# Patient Record
Sex: Female | Born: 1945
Health system: Southern US, Community
[De-identification: ages and names within clinical notes are randomized; demographics above are authoritative.]

## PROBLEM LIST (undated history)

## (undated) DIAGNOSIS — F329 Major depressive disorder, single episode, unspecified: Secondary | ICD-10-CM

## (undated) DIAGNOSIS — F32A Depression, unspecified: Secondary | ICD-10-CM

## (undated) DIAGNOSIS — R519 Headache, unspecified: Secondary | ICD-10-CM

## (undated) DIAGNOSIS — E119 Type 2 diabetes mellitus without complications: Secondary | ICD-10-CM

## (undated) DIAGNOSIS — J302 Other seasonal allergic rhinitis: Secondary | ICD-10-CM

## (undated) DIAGNOSIS — S060X9A Concussion with loss of consciousness of unspecified duration, initial encounter: Secondary | ICD-10-CM

## (undated) DIAGNOSIS — S060XAA Concussion with loss of consciousness status unknown, initial encounter: Secondary | ICD-10-CM

## (undated) DIAGNOSIS — I2510 Atherosclerotic heart disease of native coronary artery without angina pectoris: Secondary | ICD-10-CM

## (undated) DIAGNOSIS — M1990 Unspecified osteoarthritis, unspecified site: Secondary | ICD-10-CM

## (undated) DIAGNOSIS — R1310 Dysphagia, unspecified: Secondary | ICD-10-CM

## (undated) DIAGNOSIS — R51 Headache: Secondary | ICD-10-CM

## (undated) DIAGNOSIS — K219 Gastro-esophageal reflux disease without esophagitis: Secondary | ICD-10-CM

## (undated) DIAGNOSIS — Z8709 Personal history of other diseases of the respiratory system: Secondary | ICD-10-CM

## (undated) DIAGNOSIS — B351 Tinea unguium: Secondary | ICD-10-CM

## (undated) DIAGNOSIS — I10 Essential (primary) hypertension: Secondary | ICD-10-CM

## (undated) DIAGNOSIS — I499 Cardiac arrhythmia, unspecified: Secondary | ICD-10-CM

## (undated) HISTORY — DX: Atherosclerotic heart disease of native coronary artery without angina pectoris: I25.10

## (undated) HISTORY — PX: TOTAL ABDOMINAL HYSTERECTOMY: SHX209

## (undated) HISTORY — DX: Headache: R51

## (undated) HISTORY — DX: Dysphagia, unspecified: R13.10

## (undated) HISTORY — PX: APPENDECTOMY: SHX54

## (undated) HISTORY — DX: Gastro-esophageal reflux disease without esophagitis: K21.9

## (undated) HISTORY — PX: OTHER SURGICAL HISTORY: SHX169

## (undated) HISTORY — DX: Type 2 diabetes mellitus without complications: E11.9

## (undated) HISTORY — PX: EYE SURGERY: SHX253

## (undated) HISTORY — DX: Headache, unspecified: R51.9

## (undated) HISTORY — PX: BACK SURGERY: SHX140

## (undated) HISTORY — PX: CHOLECYSTECTOMY: SHX55

## (undated) HISTORY — PX: TONSILLECTOMY: SUR1361

---

## 1998-01-18 ENCOUNTER — Inpatient Hospital Stay (HOSPITAL_COMMUNITY): Admission: EM | Admit: 1998-01-18 | Discharge: 1998-01-19 | Payer: Self-pay | Admitting: Orthopedic Surgery

## 1998-07-27 ENCOUNTER — Ambulatory Visit (HOSPITAL_COMMUNITY): Admission: RE | Admit: 1998-07-27 | Discharge: 1998-07-27 | Payer: Self-pay | Admitting: Neurological Surgery

## 1998-07-27 ENCOUNTER — Encounter: Payer: Self-pay | Admitting: Neurological Surgery

## 1998-11-09 ENCOUNTER — Inpatient Hospital Stay (HOSPITAL_COMMUNITY): Admission: EM | Admit: 1998-11-09 | Discharge: 1998-11-12 | Payer: Self-pay | Admitting: Orthopedic Surgery

## 1998-11-09 ENCOUNTER — Encounter: Payer: Self-pay | Admitting: Orthopedic Surgery

## 1999-08-02 ENCOUNTER — Encounter: Payer: Self-pay | Admitting: Orthopedic Surgery

## 1999-08-02 ENCOUNTER — Ambulatory Visit (HOSPITAL_COMMUNITY): Admission: RE | Admit: 1999-08-02 | Discharge: 1999-08-02 | Payer: Self-pay | Admitting: Orthopedic Surgery

## 1999-09-12 ENCOUNTER — Encounter: Payer: Self-pay | Admitting: Orthopedic Surgery

## 1999-09-13 ENCOUNTER — Inpatient Hospital Stay (HOSPITAL_COMMUNITY): Admission: EM | Admit: 1999-09-13 | Discharge: 1999-09-14 | Payer: Self-pay | Admitting: Orthopedic Surgery

## 2001-05-20 ENCOUNTER — Encounter: Payer: Self-pay | Admitting: Physical Medicine and Rehabilitation

## 2001-05-20 ENCOUNTER — Encounter
Admission: RE | Admit: 2001-05-20 | Discharge: 2001-05-20 | Payer: Self-pay | Admitting: Physical Medicine and Rehabilitation

## 2002-10-13 ENCOUNTER — Ambulatory Visit (HOSPITAL_BASED_OUTPATIENT_CLINIC_OR_DEPARTMENT_OTHER): Admission: RE | Admit: 2002-10-13 | Discharge: 2002-10-14 | Payer: Self-pay | Admitting: Orthopedic Surgery

## 2003-04-20 ENCOUNTER — Ambulatory Visit (HOSPITAL_BASED_OUTPATIENT_CLINIC_OR_DEPARTMENT_OTHER): Admission: RE | Admit: 2003-04-20 | Discharge: 2003-04-20 | Payer: Self-pay | Admitting: Orthopedic Surgery

## 2005-07-23 ENCOUNTER — Ambulatory Visit: Payer: Self-pay | Admitting: Internal Medicine

## 2006-07-24 ENCOUNTER — Ambulatory Visit: Payer: Self-pay | Admitting: Internal Medicine

## 2006-07-30 ENCOUNTER — Ambulatory Visit (HOSPITAL_COMMUNITY): Admission: RE | Admit: 2006-07-30 | Discharge: 2006-07-30 | Payer: Self-pay | Admitting: Internal Medicine

## 2006-07-30 ENCOUNTER — Ambulatory Visit: Payer: Self-pay | Admitting: Internal Medicine

## 2006-07-31 ENCOUNTER — Ambulatory Visit (HOSPITAL_COMMUNITY): Admission: RE | Admit: 2006-07-31 | Discharge: 2006-07-31 | Payer: Self-pay | Admitting: Internal Medicine

## 2006-09-11 ENCOUNTER — Ambulatory Visit: Payer: Self-pay | Admitting: Internal Medicine

## 2006-09-20 ENCOUNTER — Ambulatory Visit: Payer: Self-pay | Admitting: Internal Medicine

## 2006-09-20 ENCOUNTER — Ambulatory Visit (HOSPITAL_COMMUNITY): Admission: RE | Admit: 2006-09-20 | Discharge: 2006-09-20 | Payer: Self-pay | Admitting: Internal Medicine

## 2007-02-19 ENCOUNTER — Ambulatory Visit: Payer: Self-pay | Admitting: Cardiology

## 2007-07-29 ENCOUNTER — Ambulatory Visit (HOSPITAL_BASED_OUTPATIENT_CLINIC_OR_DEPARTMENT_OTHER): Admission: RE | Admit: 2007-07-29 | Discharge: 2007-07-29 | Payer: Self-pay | Admitting: Orthopedic Surgery

## 2008-02-25 ENCOUNTER — Ambulatory Visit (HOSPITAL_BASED_OUTPATIENT_CLINIC_OR_DEPARTMENT_OTHER): Admission: RE | Admit: 2008-02-25 | Discharge: 2008-02-25 | Payer: Self-pay | Admitting: Orthopedic Surgery

## 2010-10-31 NOTE — Op Note (Signed)
NAMEHELENA, Katrina Castro              ACCOUNT NO.:  [id number]   MEDICAL RECORD NO.:  [id number]          PATIENT TYPE:  AMB   LOCATION:  DSC                          FACILITY:  [facility]   PHYSICIAN:  Leonides Grills, M.D.     DATE OF BIRTH:  September 18, 1945   DATE OF PROCEDURE:  02/25/2008  DATE OF DISCHARGE:                               OPERATIVE REPORT   PREOPERATIVE DIAGNOSIS:  Right bunion on to his right fifth metatarsal  spur.   POSTOPERATIVE DIAGNOSIS:  Right bunion on to his right fifth metatarsal  spur.   OPERATION:  1. Right varus fifth metatarsal osteotomy.  2. Excision dorsal fifth metatarsal head spur.  3. Stress x-rays of foot.   ANESTHESIA:  General.   SURGEON:  Leonides Grills, MD   ASSISTANT:  Richardean Canal, PA-C   ESTIMATED BLOOD LOSS:  Minimal.   TOURNIQUET TIME:  Approximately 40 minutes.   COMPLICATIONS:  None.   DISPOSITION:  Stable to PR.   INDICATION:  This is a 65 year old female, who has had persistent pain  over the lateral aspect fifth metatarsal head.  She was found to have an  increased 4, 5 inner metatarsal angle.  She was sent for the above  procedure.  All risks of infection or vessel injury, nonunion, malunion,  hardware irritation, hardware failure, persistent pain, worsening pain,  prolonged recovery, stiffness, arthritis, painful scar tissue formation  were all explained.  Questions were encouraged and answered.   OPERATION:  The patient was brought to the operating room, placed in  supine position after adequate general anesthesia administered as well  as Ancef 1 g IV piggyback.  Bump was placed on the right ipsilateral  hip, internally rotating right lower extremity.  Right lower extremity  was prepped and draped in sterile manner over proximally placed thigh  tourniquet.  The limb was gravity exsanguinated and the tourniquet was  inflated to 90 mmHg.  A longitudinal incision on dorsolateral aspect to  the right fifth metatarsal shaft was then  made.  Dissection was carried  down through skin.  Hemostasis was obtained.  Soft tissue was elevated  superiorly and inferiorly.  We then extended the incision distally over  the dorsolateral aspect of fifth metatarsal head and removed the spur  with a rongeur.  This palpated through skin and adequately decompressed.  We then performed an oblique osteotomy through the right fifth  metatarsal.  We then corrected the deformity and then placed 2.0 mm  fully threaded cortical set screws using 1.5-mm drill hole respectively.  This had excellent purchase and maintenance of the correction.  The  redundant bone ledge was trimmed off with a sagittal saw.  Stress x-rays  were obtained in the AP and lateral planes, showed no  gross motion fixation, proprioception, and excellent alignment as well.  Area was copiously irrigated with normal saline.  Tourniquet deflated.  Hemostasis was obtained.  Subcu was closed with 3-0 Vicryl.  Skin was  closed with 4-0 nylon.  Sterile dressing was applied.  A hard-soled shoe  was applied.  The patient was stable to PR.  Leonides Grills, M.D.  Electronically Signed     PB/MEDQ  D:  02/25/2008  T:  02/26/2008  Job:  [id number]

## 2010-10-31 NOTE — Op Note (Signed)
[name], Katrina Castro              ACCOUNT NO.:  [id number]   MEDICAL RECORD NO.:  [id number]          PATIENT TYPE:  AMB   LOCATION:  DSC                          FACILITY:  [facility]   PHYSICIAN:  Leonides Grills, M.D.     DATE OF BIRTH:  01-31-1946   DATE OF PROCEDURE:  07/29/2007  DATE OF DISCHARGE:                               OPERATIVE REPORT   PREOPERATIVE DIAGNOSIS:  Complication of hardware, left second toe K-  wire.   POSTOPERATIVE DIAGNOSIS:  Complication of hardware, left second toe K-  wire, plus complication of hardware, left third toe K-wire.   PROCEDURE:  1. K-wire removal left second and third toes.  2. Fixation left second and third toes with K-wires under C-arm      guidance.   ANESTHESIA:  General.   SURGEON:  Leonides Grills, M.D.   ASSISTANT:  None.   COMPLICATIONS:  None.   DISPOSITION:  Stable to the PR.   INDICATIONS:  65 year old female who, approximately three weeks ago,  underwent a left second and third [employer] hammertoe reconstruction  and bent her K-wires with heavy covers on her wires without them being  protected.  She was consented for the above procedure.  All risks  including infection, neurovascular injury, deformity of the toes,  persistent pain, again, were all explained, questions were encouraged  and answered.   OPERATION:  The patient was brought to the operating room and placed in  a supine position. After adequate general anesthesia was administered as  well as Ancef 1 gram IV piggyback, the left lower extremity was prepped  and draped in a sterile manner.  No tourniquet was used.  The second K-  wire was removed with needle nose pliers and this was intact and was  slightly bent. This was then replaced with a 0.062 K-wire double ended  trocar which was placed under C-arm guidance and was placed in a  position that would seem more symmetric across her forefoot. Her great  toe has hallux varus and her fourth toe is in valgus, which  leaves a lot  of room between the great toe and fourth toe. We also checked the third  toe wire and this was grossly loose and appeared to be slightly bent. We  decided to remove this wire in an effort to create symmetry of her toes.  This wire was removed and replaced with a 0.062 K-wire under C-arm  guidance, as well.  Once this wire was in place and had excellent  purchase and clinically,  there was no gross motion, we obtained an x-ray to verify its excellent  alignment.  K-wires were bent, cut and capped. Xeroform was placed  around the wires.  A soft dressing was applied.  A hard sole shoe was  applied.  The patient was stable to the PR.      Leonides Grills, M.D.  Electronically Signed     PB/MEDQ  D:  07/29/2007  T:  07/30/2007  Job:  [id number]

## 2010-11-03 NOTE — H&P (Signed)
Katrina Castro, Katrina Castro              ACCOUNT NO.:  [id number]   MEDICAL RECORD NO.:  [id number]           PATIENT TYPE:  AMB   LOCATION:                                FACILITY:  [facility]   PHYSICIAN:  Lionel December, M.D.    DATE OF BIRTH:  11/27/1945   DATE OF ADMISSION:  DATE OF DISCHARGE:  LH                              HISTORY & PHYSICAL   PRESENTING COMPLAINT:  Solid food dysphagia.   Abnormal abdominopelvic CT revealing abnormality to the mesentery and  large lymph nodes.  Study done May 01, 2006 at [facility].   HISTORY OF PRESENT ILLNESS:  Katrina Castro is a 65 year old Caucasian female  who is here for a scheduled visit.  She is well-known to me; however,  she has not been seen in this office since February of 2007.   She has been experiencing intermittent abdominal pain.  She had an  abdominopelvic CT by Dr. Sherril Croon on May 01, 2006 which revealed  segmental misty mesentery with prominent lymph nodes.  Dr. Ilsa Iha, who  read the films, felt that these findings were nonspecific, but can be  the results of previous inflammatory or infectious process, and he felt  malignancy is within the differential diagnosis but less likely, given  the appearance.  He recommended a follow up exam.  Please note that this  study was done looking for stones and was an unenhanced study.  She is  still having intermittent pain in her mid abdomen.  She also complains  of irregular bowel movements.  She recently became constipated and had  difficulty getting relief.  She recently had a GI virus, from which she  is recovered.  She has lost 24 pounds since her last visit.  She states  all of this weight loss has occurred in the last 3 months.  She has  nausea.  She denies melena, rectal bleeding or vomiting.  She states  Nexium was not covered on her plan and she could not afford it.  She is  on omeprazole which is not controlling her heartburn.  She also  complains of chronic back pain.  She has had multiple  surgeries.   MEDICATIONS:  1. Omeprazole 20 mg q.a.m.  2. Colace 1-2 tablets daily p.r.n.  3. MOM p.r.n.  4. Lunesta, question dose, q.h.s. p.r.n.   PAST MEDICAL HISTORY:  1. She has chronic GERD.  She has undergone multiple EGDs.  She had      her esophagus dilated in January of 2001.  She has history of      erosive reflux esophagitis ring and a sliding hiatal hernia.  She      also has history of abdominal pain and irregular bowel movements.      About 6-7 years ago, she was having diarrhea, and she had multiple      studies including small-bowel follow-through.  Her last colonoscopy      was in July of 2003 by Dr. Linna Darner, which was a normal exam.  2. She has fibromyalgia.  3. She has chronic low back pain.  She has had multiple back  surgeries; the last one was in 2000 or 2001.  4. She has had bilateral cataract extraction which was in 2006.  5. Bilateral bunionectomy.  6. Hysterectomy with oophorectomy.  7. At age 49, she had laparoscopic cholecystectomy.  8. In October of 1996, she had right knee arthroscopy.  9. She also had surgery on her right foot in September of 2004.  10.She was admitted to [facility] in June of 2006 with what was felt to be      acute gastroenteritis.  She was noted to have elevated      transaminases, and she believes they have subsequently been normal.      She also had a celiac antibody panel in February of 2007, which was      negative.  (This was may be incomplete.)   ALLERGIES:  1. SULFA causing mouth ulcers.  2. She developed nausea, vomiting of OXYCONTIN.   FAMILY HISTORY:  Positive for diabetes mellitus in mother and a sister,  both of whom are deceased.  Mother lived to be 18 and sister died in her  74s.  Father died of CAD at 74.   SOCIAL HISTORY:  She is disabled.  She is divorced.  She has 2 children.  She does not smoke cigarettes but drinks alcohol occasionally.   OBJECTIVE:  VITAL SIGNS:  Weight 154 pounds.  She is 5 feet 2 inches   tall.  Pulse 64 per minute, blood pressure 112/68, temperature is 98.5.  HEENT:  Conjunctivae pink.  Sclerae nonicteric.  Oropharyngeal mucosa is  normal.  NECK:  No neck masses or thyromegaly noted.  LUNGS:  Clear to auscultation.  ABDOMEN:  Symmetrical.  Bowel sounds are normal.  On palpation, soft  abdomen with mild tenderness in the midepigastrium, periumbilical area  in left lower quadrant.  No organomegaly or masses noted.  RECTAL:  Deferred.  EXTREMITIES:  No clubbing or edema noted.   ASSESSMENT:  1. Dysphagia.  She possibly has recurrent ring.  Her gastroesophageal      reflux disease symptoms are not well controlled with a single dose      of Omeprazole.  Her upper gastrointestinal tract will be      reevaluated and her esophagus dilated.  2. Recurrent abdominal pain with abnormality on abdominal CT done in      November of 2007 showing a hazy mesentery and enlarged lymph nodes.      Over the years, she had multiple CTs, but these findings have not      been appreciated on prior studies.  It is almost 3 months;      therefore, it is time for [place] to repeat the study with oral and IV      contrast.  3. Weight loss.  This may be related to her solid food dysphagia.  I      hope we are not dealing with a malignant process.   RECOMMENDATIONS:  1. The patient advised to take 2 tablets of Colace every night.  2. Esophagogastroduodenoscopy and esophageal dilation to be performed      at [facility] in the near      future.  3. Increase Omeprazole to 40 mg q.a.m.; new prescription given.  4. Abdominopelvic CT with oral and IV contrast to be performed at [facility]      in the near future.      Lionel December, M.D.  Electronically Signed     NR/MEDQ  D:  07/24/2006  T:  07/25/2006  Job:  [id number]   cc:   Donzetta Sprung  Fax: [phone]

## 2010-11-03 NOTE — Op Note (Signed)
NAME:  Katrina Castro, Katrina Castro                        ACCOUNT NO.:  [id number]   MEDICAL RECORD NO.:  [id number]                   PATIENT TYPE:  AMB   LOCATION:  DSC                                  FACILITY:  [facility]   PHYSICIAN:  Leonides Grills, M.D.                  DATE OF BIRTH:  14-Nov-1945   DATE OF PROCEDURE:  10/13/2002  DATE OF DISCHARGE:                                 OPERATIVE REPORT   PREOPERATIVE DIAGNOSIS:  1. Right second, third and fourth hammer toes.  2. Right second metatarsalgia.  3. Right tight gastroc.   POSTOPERATIVE DIAGNOSIS:  1. Right second, third and fourth hammer toes.  2. Right second metatarsalgia.  3. Right tight gastroc.   OPERATION PERFORMED:  1. Right second Weil metatarsal shortening osteotomy.  2. Right gastroc slide.  3. Right second through fourth toes metatarsophalangeal joint dorsal     capsulotomy with collateral release.  4. Right second through fourth toes proximal phalanx head resections.  5. Right second through fourth toes flexor digitorum longus to proximal     phalanx transfers.  6. Right second through fourth toes extensor digitorum brevis to extensor     digitorum longus transfers.   SURGEON:  Leonides Grills, M.D.   ASSISTANT:  Lianne Cure, P.A.   ANESTHESIA:  General endotracheal tube.   ESTIMATED BLOOD LOSS:  Minimal.   TOURNIQUET TIME:  Approximately an hour and a half.   COMPLICATIONS:  None.   DISPOSITION:  Stable to the PR.   INDICATIONS FOR PROCEDURE:  This is a 65 year old female who has had a  previous lesser toe reconstructive procedures in the past.  However, she has  developed a recurrence of the deformity to a point where it is interfering  with her life to the point that she cannot do what she wants to do.  She was  consented for the above procedure.  All risks which include infection,  neurovascular injury, persistent pain, worsening of pain, recurrence of the  deformity, ischemia of the toe with possible  amputation.  All were  explained, questions were encouraged and answered.   DESCRIPTION OF PROCEDURE:  The patient was brought to the operating room and  placed in supine position.  After adequate general endotracheal tube  anesthesia was administered as well as Ancef 1 gm IV piggyback the right  lower extremity was then prepped and draped in sterile manner over a  proximally placed thigh tourniquet.  The procedure commenced with a  longitudinal incision over the medial gastroc musculotendinous junction.  Dissection was carried down through skin.  Hemostasis was obtained.  Fascia  was opened in line with the incision.  The conjoined region between gastroc  soleus was then developed.  Soft tissues were then elevated off the  posterior aspect of the gastrocnemius tendon.  The sural nerve was  identified posteriorly and protected as well.  Then with the Mayo scissors,  the gastrocnemius tendon was released.  The wound was copiously irrigated  with normal saline.  The subcu was closed with 3-0 Vicryl, skin was closed  with 4-0 Monocryl subcuticular stitch.  Steri-Strips were applied.  The limb  was gravity exsanguinated and the tourniquet was elevated to 290 mmHg.  Longitudinal incision over the second toe was then made.  Dissection was  carried down to the extensor tendons.  The extensor digitorum brevis was  tenotomized distally at the PIP joint and the FDL tendon proximally at the  MTP joint.  The MPT joint dorsal capsulotomy was then performed with  collateral release with a 15 blade scalpel.  PIP joint was then entered and  was solidly fused in a 45 degree flexed position.  With the rongeur, this  was then opened.  After fastening this in the proper position and resecting  the head of the proximal phalanx, a __________ was then done of the mallet  toe distally and this straightened out nicely.  The FPL tendon was then  identified and through the plantar plate base and was tenotomized as  distal  as possible and also helped with the mallet toe deformity as well.  We then  back into the MTP joint with the toe flexed.  Weil osteotomy was then done  with a sagittal saw with the saw blade parallel to the plantar aspect of the  foot.  Once this was done, the metatarsal head was shortened about 3 to 4  mm.  This was then fixed with a new deal 12 mm partially threaded screw.  The remaining dorsal portion of the osteotomy was trimmed with a rongeur.  A  3.5 mm drill hole was then placed in the base of the proximal phalanx.  With  3-0 PDS, the FDL tendon was then transferred through the base of the  proximal phalanx from plantar to dorsal.  A 0.045 double ended trocar K-wire  was then placed in antegrade manner through the middle and distal phalanx  and with the PIP held reduced, the K-wire was then advanced through the  proximal phalanx.  Then with the MTP joint held reduced, the ankle in  neutral dorsiflexion and tension on the FDL tendon through the drill hole, K-  wire was then passed across the MTP joint.  The EDB to EDL was then  transferred dorsally with 3-0 PDS.  The same exact procedure was done  through a separate incision for the third and fourth toes respectively;  however, we did not do the Weil osteotomy for the third and fourth toes.  The tourniquet was deflated.  At the end of the procedure, all toes pinked  up nicely.  Skin relieving incisions with an 11 blade scalpel were then made  over each K-wire individually.  K-wires were bent, cut and capped.  Wounds  were closed with 4-0 nylon suture.  Sterile dressing was applied, Cam walker  boot was applied.  Patient was stable to the PAR.                                                Leonides Grills, M.D.    PB/MEDQ  D:  10/13/2002  T:  10/13/2002  Job:  [id number]

## 2010-11-03 NOTE — Op Note (Signed)
Katrina Castro              ACCOUNT NO.:  [id number]   MEDICAL RECORD NO.:  [id number]          PATIENT TYPE:  AMB   LOCATION:  DAY                           FACILITY:  [facility]   PHYSICIAN:  Lionel December, M.D.    DATE OF BIRTH:  1946-06-18   DATE OF PROCEDURE:  07/30/2006  DATE OF DISCHARGE:                               OPERATIVE REPORT   PROCEDURE:  Esophagogastroduodenoscopy with esophageal dilation.   INDICATIONS:  Katrina Castro is a 65 year old Caucasian female with symptoms of  chronic GERD poorly controlled with therapy, who also has solid food  dysphagia.  She has been having intermittent hoarseness.  She was  recently seen in the office and her omeprazole dose was doubled to 40 mg  q.a.m. and she has noted some improvement in her heartburn.  Procedure  risks were reviewed with the patient and informed consent was obtained.   MEDICATIONS FOR CONSCIOUS SEDATION:  Benzocaine spray for pharyngeal  topical anesthesia, Demerol 50 mg and IV Versed 10 mg IV in divided  dose.   FINDINGS:  Procedure performed in endoscopy suite.  The patient's vital  signs and O2 sats were monitored during the procedure and remained  stable.  The patient was placed in the left lateral recumbent position  and Pentax videoscope was passed via oropharynx without any difficulty  into esophagus.   Esophagus:  Mucosa of the esophagus was normal.  Noncritical ring was  noted at GE junction which was 34 cm and hiatus was at 36 cm.  She had  small sliding hiatal hernia.   Stomach:  It was empty and distended very well with insufflation.  Folds  of the proximal stomach were normal.  Examination of mucosa at body,  antrum, pyloric channel as well as angularis, fundus and cardia was  normal.   Duodenum:  Bulbar mucosa was normal.  Scope was passed to the second  part of the duodenum where mucosa and folds were normal.  Endoscope was  withdrawn.   Esophagus was dilated by passing 56-French [facility]oney dilator  to full  insertion.  Esophageal mucosa was reexamined post dilation and the ring  was noted to have been disrupted.  Pictures taken for the record.  Endoscope was withdrawn.  The patient tolerated the procedure well.   FINAL DIAGNOSES:  1. Distal esophageal ring and a small sliding hiatal hernia.  Ring      disrupted by passing 56-French [facility] dilator.  2. Normal examination of stomach, first and second part of the      duodenum.   RECOMMENDATIONS:  1. Antireflux measures reinforced.  2. She will continue omeprazole at 40 mg p.o. q.a.m.Marland Kitchen  3. Abdominopelvic CT as planned to follow up abnormality seen on her      last study about 12 weeks ago.      Lionel December, M.D.  Electronically Signed     NR/MEDQ  D:  07/30/2006  T:  07/30/2006  Job:  [id number]

## 2010-11-03 NOTE — Op Note (Signed)
Katrina Castro, Katrina Castro              ACCOUNT NO.:  [id number]   MEDICAL RECORD NO.:  [id number]          PATIENT TYPE:  AMB   LOCATION:  DAY                           FACILITY:  [facility]   PHYSICIAN:  Lionel December, M.D.    DATE OF BIRTH:  [birth year]   DATE OF PROCEDURE:  09/20/2006  DATE OF DISCHARGE:  09/20/2006                               OPERATIVE REPORT   INDICATIONS:  Katrina Castro is 65 year old Caucasian female who has history of  esophageal ring. She underwent EGD with ED on July 30, 2006.  The  ring was noncritical and was disrupted by passing 56-French [facility]  dilator.  However, she is not any better.  She also had barium pill  study and [facility] which was unremarkable.  She is undergoing esophageal  manometry to rule out motility disorder.  The procedure risks were  reviewed with the patient and informed consent was obtained.   PROCEDURE:  Standard solid-state [employer] system was used.   FINDINGS:  The esophageal body, 10 out of 10 swallows were peristaltic.   Mean amplitude at 13 cm proximal. LES was 32.3 mmHg and duration was 1.4  seconds.  Mean amplitude at 8 cm was 112.2 mmHg and duration was 4.1  seconds. Mean amplitude at 3 cm was 163 mmHg and duration was 5 seconds.  Mean amplitude in distal two ports 137.6 mmHg and duration was 4.55  seconds.   Lower esophageal sphincter located between 36 and 39 cm from the  incisors.  Resting LES pressure 28.5 mmHg, relaxation 100%.   IMPRESSION:  Normal LES and esophageal body study.   RECOMMENDATIONS:  Empiric trial with calcium channel blocker, start on  Cardizem 120 mg daily.  If she does not respond to this therapy would  consider referral to tertiary center for impedance study.      Lionel December, M.D.  Electronically Signed     NR/MEDQ  D:  10/02/2006  T:  10/02/2006  Job:  [id number]   cc:   Donzetta Sprung  Fax: [phone]

## 2010-11-03 NOTE — Op Note (Signed)
NAME:  Katrina Castro, Katrina Castro                        ACCOUNT NO.:  [id number]   MEDICAL RECORD NO.:  [id number]                   PATIENT TYPE:  AMB   LOCATION:  DSC                                  FACILITY:  [facility]   PHYSICIAN:  Leonides Grills, M.D.                  DATE OF BIRTH:  06/30/1945   DATE OF PROCEDURE:  04/20/2003  DATE OF DISCHARGE:                                 OPERATIVE REPORT   PREOPERATIVE DIAGNOSIS:  Right fifth hammer toe.   POSTOPERATIVE DIAGNOSIS:  Right fifth hammer toe.   OPERATION:  1. Right fifth toe metatarsophalangeal joint dorsal capsulotomy with     collateral release.  2. Right fifth toe proximal phalanx head resection.  3. Right fifth toe flexor digitorum longus to proximal phalanx transfer.  4. Right fifth toe extensor digitorum brevis to extensor digitorum longus     transfer.   ANESTHESIA:  General endotracheal tube.   SURGEON:  Leonides Grills, M.D.   ASSISTANT:  Lianne Cure, P.A.   ESTIMATED BLOOD LOSS:  Minimal.   TOURNIQUET TIME:  Approximately a half hour.   COMPLICATIONS:  None.   DISPOSITION:  Stable to the PAR.   INDICATIONS:  This is a 65 year old female who had previous forefoot  reconstruction where the second, third, and fourth toes were reconstructed  and we left the fifth.  However, the fifth has developed a hammer toe and  now has become symptomatic and bothersome to the point where it is  interfering with her life.  She has consented to the above procedure.  All  risks, which include infection, nerve or vessel injury, ischemia with  amputation, recurrence of deformity, pain, and callus formation, were all  explained.  Questions were encouraged and answered.   OPERATION:  The patient was brought to the operating room and placed in  supine position after adequate general endotracheal tube anesthesia was  administered as well as Ancef 1 g IV piggyback.  The right lower extremity  was then prepped and draped in a sterile  manner.  Over a proximally-placed  thigh tourniquet, the limb was gravity-exsanguinated and the tourniquet was  elevated to 290 mmHg.  A longitudinal incision over the fifth toe was then  made.  Dissection was carried down through skin.  The extensor digitorum  longus was then identified and a Z lengthening was then performed.  Once  this was lengthened, the MTP joint was then identified and a dorsal  capsulotomy with collateral release was performed with a 15 blade scalpel.  The PIP joint was then entered and the distal aspect of the proximal phalanx  was skeletonized.  The head was then removed with a rongeur, followed with a  bone cutter.  Drill holes 2.5 and 3.5 mm were then made in the base of the  proximal phalanx, respectively, and a longitudinal incision was then made in  the plantar plate.  The FDL  tendon was then identified.  Then this was  meticulously dissected out and tenotomized as distal as possible.  Then with  3-0 PDS suture, the FDL tendon was then pulled through the drill hole in the  base of the proximal phalanx.  Once this was done a 0.045 double-trocar K-  wire was then placed through the middle and distal phalanx in an antegrade  manner.  The PIP joint was then reduced and the K-wire was then fired into  the proximal phalanx.  Then with the ankle in neutral dorsiflexion and  tension on the FDL tendon through the drill hole, the K-wire was then passed  across the MTP joint with the toe held in its anatomic position.  Once this  was done, we then completed the EDL lengthening dorsally with 3-0 PDS suture  as well as suturing it to the FDL tendon through the drill holes.  The wound  was copiously irrigated with normal saline.  The tourniquet was deflated.  The toe pinked up beautifully.  An 11 blade scalpel was then used to perform  skin-relieving incisions around the K-wire.  The K-wire was bent, cut, and  capped.  The wound was closed with 4-0 nylon.  A sterile dressing  was  applied, a hard-soled shoe was applied.  The patient was stable to PAR.                                               Leonides Grills, M.D.    PB/MEDQ  D:  04/20/2003  T:  04/20/2003  Job:  [id number]

## 2011-03-09 LAB — POCT HEMOGLOBIN-HEMACUE: Hemoglobin: 13.6

## 2011-03-21 LAB — POCT HEMOGLOBIN-HEMACUE: Hemoglobin: 13.7

## 2011-11-26 ENCOUNTER — Ambulatory Visit (INDEPENDENT_AMBULATORY_CARE_PROVIDER_SITE_OTHER): Payer: Medicare Other | Admitting: Internal Medicine

## 2011-11-26 ENCOUNTER — Other Ambulatory Visit (INDEPENDENT_AMBULATORY_CARE_PROVIDER_SITE_OTHER): Payer: Self-pay | Admitting: *Deleted

## 2011-11-26 ENCOUNTER — Encounter (INDEPENDENT_AMBULATORY_CARE_PROVIDER_SITE_OTHER): Payer: Self-pay | Admitting: *Deleted

## 2011-11-26 ENCOUNTER — Encounter (INDEPENDENT_AMBULATORY_CARE_PROVIDER_SITE_OTHER): Payer: Self-pay | Admitting: Internal Medicine

## 2011-11-26 VITALS — BP 108/72 | HR 72 | Temp 98.3°F | Ht 63.0 in | Wt 170.0 lb

## 2011-11-26 DIAGNOSIS — R1310 Dysphagia, unspecified: Secondary | ICD-10-CM | POA: Insufficient documentation

## 2011-11-26 MED ORDER — PANTOPRAZOLE SODIUM 40 MG PO TBEC: 40.0000 mg | DELAYED_RELEASE_TABLET | Freq: Every day | ORAL | Status: DC

## 2011-11-26 MED ORDER — SUCRALFATE 1 GM/10ML PO SUSP: 1.0000 g | Freq: Four times a day (QID) | ORAL | Status: DC

## 2011-11-26 NOTE — Progress Notes (Signed)
Subjective:     Patient ID: Katrina Castro, female   DOB: [birth year], 66 y.o.   MRN: [id number]  HPI Katrina Castro is a 66 yr old female presenting today with c/o that food are lodging. She tells me she has to keep drinking fluids for the bolus to go down.  Her esophagus burns.  She has dysphagia for about 1 1/2 years.  Breads in particular will lodge.  She says when the food lodges, she has chest pain. She is sleeping on 2 pillows at night. She has had several EGD/ED in the past.    Appetite is not good. No weight loss.  No abdominal pain.  She has a BM about 1 a day.  She has increased her fiber in her diet.  No melena or bright red rectal bleeding.   EGD 08/27/07: Non-critical ring at GE junction with erosion. Two antral erosions. Esophagus dilated by passing 54 French Maloney dilator and this ring was disrupted. Biopsy: No significant abnormality identifed. Review of Systems see hpi Current Outpatient Prescriptions  Medication Sig Dispense Refill  . levocetirizine (XYZAL) 5 MG tablet Take 5 mg by mouth every evening.      Marland Kitchen omeprazole (PRILOSEC) 20 MG capsule Take 20 mg by mouth daily.       Past Medical History  Diagnosis Date  . Dysphagia   . GERD (gastroesophageal reflux disease)    Past Surgical History  Procedure Date  . Back surgeries x 4   . Knee arthrosciopy   . Total abdominal hysterectomy   . Foot surgeries    History   Social History  . Marital Status: Single    Spouse Name: N/A    Number of Children: N/A  . Years of Education: N/A   Occupational History  . Not on file.   Social History Main Topics  . Smoking status: Never Smoker   . Smokeless tobacco: Not on file  . Alcohol Use: No  . Drug Use: No  . Sexually Active: Not on file   Other Topics Concern  . Not on file   Social History Narrative  . No narrative on file   Family Status  Relation Status Death Age  . Mother Deceased     unknown  . Father Deceased     age 24 from a stroke  . Sister Deceased      Diabetes  Allergies not on file      Objective:   Physical Exam Filed Vitals:   11/26/11 1109  Height: 5\' 3"  (1.6 m)  Weight: 170 lb (77.111 kg)  Alert and oriented. Skin warm and dry. Oral mucosa is moist.   . Sclera anicteric, conjunctivae is pink. Thyroid not enlarged. No cervical lymphadenopathy. Lungs clear. Heart regular rate and rhythm.  Abdomen is soft. Bowel sounds are positive. No hepatomegaly. No abdominal masses felt. No tenderness.  No edema to lower extremities. Patient is alert and oriented.       Assessment:   Solid food dysphagia. Hx of same.    Plan:    Carafate 1 gm qid, Protonix 40mg  one 30 minutes before breakfat.

## 2011-11-26 NOTE — Patient Instructions (Signed)
Chew food swell. Will call Rx for Protonix and Carafate. EGD/ED

## 2011-11-27 ENCOUNTER — Encounter (INDEPENDENT_AMBULATORY_CARE_PROVIDER_SITE_OTHER): Payer: Self-pay

## 2011-12-11 ENCOUNTER — Encounter (HOSPITAL_COMMUNITY)
Admission: RE | Disposition: A | Discharge status: Home / Self Care | Payer: Self-pay | Source: Ambulatory Visit | Attending: Internal Medicine

## 2011-12-11 ENCOUNTER — Ambulatory Visit (HOSPITAL_COMMUNITY)
Admission: RE | Admit: 2011-12-11 | Discharge: 2011-12-11 | Disposition: A | Discharge status: Home / Self Care | Payer: Medicare Other | Source: Ambulatory Visit | Attending: Internal Medicine | Admitting: Internal Medicine

## 2011-12-11 ENCOUNTER — Encounter (HOSPITAL_COMMUNITY): Payer: Self-pay | Admitting: *Deleted

## 2011-12-11 DIAGNOSIS — K219 Gastro-esophageal reflux disease without esophagitis: Secondary | ICD-10-CM

## 2011-12-11 DIAGNOSIS — R1310 Dysphagia, unspecified: Secondary | ICD-10-CM

## 2011-12-11 DIAGNOSIS — K222 Esophageal obstruction: Secondary | ICD-10-CM | POA: Insufficient documentation

## 2011-12-11 DIAGNOSIS — K2940 Chronic atrophic gastritis without bleeding: Secondary | ICD-10-CM | POA: Insufficient documentation

## 2011-12-11 DIAGNOSIS — K449 Diaphragmatic hernia without obstruction or gangrene: Secondary | ICD-10-CM | POA: Insufficient documentation

## 2011-12-11 DIAGNOSIS — K2960 Other gastritis without bleeding: Secondary | ICD-10-CM

## 2011-12-11 HISTORY — DX: Personal history of other diseases of the respiratory system: Z87.09

## 2011-12-11 HISTORY — DX: Other seasonal allergic rhinitis: J30.2

## 2011-12-11 SURGERY — ESOPHAGOGASTRODUODENOSCOPY (EGD) WITH ESOPHAGEAL DILATION
Anesthesia: Moderate Sedation

## 2011-12-11 MED ORDER — MEPERIDINE HCL 25 MG/ML IJ SOLN
INTRAMUSCULAR | Status: DC | PRN
  Administered 2011-12-11 (×2): 25 mg via INTRAVENOUS

## 2011-12-11 MED ORDER — MIDAZOLAM HCL 5 MG/5ML IJ SOLN
INTRAMUSCULAR | Status: DC | PRN
  Administered 2011-12-11: 2 mg via INTRAVENOUS
  Administered 2011-12-11: 1 mg via INTRAVENOUS
  Administered 2011-12-11: 2 mg via INTRAVENOUS
  Administered 2011-12-11: 3 mg via INTRAVENOUS
  Administered 2011-12-11: 2 mg via INTRAVENOUS

## 2011-12-11 MED ORDER — BUTAMBEN-TETRACAINE-BENZOCAINE 2-2-14 % EX AERO
INHALATION_SPRAY | CUTANEOUS | Status: DC | PRN
  Administered 2011-12-11: 2 via TOPICAL

## 2011-12-11 MED ORDER — MEPERIDINE HCL 50 MG/ML IJ SOLN
INTRAMUSCULAR | Status: AC
  Filled 2011-12-11: qty 1

## 2011-12-11 MED ORDER — STERILE WATER FOR IRRIGATION IR SOLN
Status: DC | PRN
  Administered 2011-12-11: 08:00:00

## 2011-12-11 MED ORDER — SODIUM CHLORIDE 0.45 % IV SOLN
Freq: Once | INTRAVENOUS | Status: AC
  Administered 2011-12-11: 07:00:00 via INTRAVENOUS

## 2011-12-11 MED ORDER — PANTOPRAZOLE SODIUM 40 MG PO TBEC: 40.0000 mg | DELAYED_RELEASE_TABLET | Freq: Two times a day (BID) | ORAL | Status: DC

## 2011-12-11 MED ORDER — MIDAZOLAM HCL 5 MG/5ML IJ SOLN
INTRAMUSCULAR | Status: AC
  Filled 2011-12-11: qty 10

## 2011-12-11 NOTE — Discharge Instructions (Signed)
Continue anti-reflux measures. Increase pantoprazole to 40 mg by mouth 30 minutes before breakfast and evening meal daily. No driving for 24 hours. Physician will contact you with results of blood work. Office visit in 4 weeks   Endoscopy Care After Please read the instructions outlined below and refer to this sheet in the next few weeks. These discharge instructions provide you with general information on caring for yourself after you leave the hospital. Your doctor may also give you specific instructions. While your treatment has been planned according to the most current medical practices available, unavoidable complications occasionally occur. If you have any problems or questions after discharge, please call your doctor. HOME CARE INSTRUCTIONS Activity  You may resume your regular activity but move at a slower pace for the next 24 hours.   Take frequent rest periods for the next 24 hours.   Walking will help expel (get rid of) the air and reduce the bloated feeling in your abdomen.   No driving for 24 hours (because of the anesthesia (medicine) used during the test).   You may shower.   Do not sign any important legal documents or operate any machinery for 24 hours (because of the anesthesia used during the test).  Nutrition  Drink plenty of fluids.   You may resume your normal diet.   Begin with a light meal and progress to your normal diet.   Avoid alcoholic beverages for 24 hours or as instructed by your caregiver.  Medications You may resume your normal medications unless your caregiver tells you otherwise. What you can expect today  You may experience abdominal discomfort such as a feeling of fullness or "gas" pains.   You may experience a sore throat for 2 to 3 days. This is normal. Gargling with salt water may help this.  Follow-up Your doctor will discuss the results of your test with you. SEEK IMMEDIATE MEDICAL CARE IF:  You have excessive nausea (feeling  sick to your stomach) and/or vomiting.   You have severe abdominal pain and distention (swelling).   You have trouble swallowing.   You have a temperature over 100 F (37.8 C).   You have rectal bleeding or vomiting of blood.  Document Released: 01/17/2004 Document Revised: 05/24/2011 Document Reviewed: 07/30/2007 [facility] Patient Information 2012 [place], [place].   Gastroesophageal Reflux Disease, Adult Gastroesophageal reflux disease (GERD) happens when acid from your stomach flows up into the esophagus. When acid comes in contact with the esophagus, the acid causes soreness (inflammation) in the esophagus. Over time, GERD may create small holes (ulcers) in the lining of the esophagus. CAUSES   Increased body weight. This puts pressure on the stomach, making acid rise from the stomach into the esophagus.   Smoking. This increases acid production in the stomach.   Drinking alcohol. This causes decreased pressure in the lower esophageal sphincter (valve or ring of muscle between the esophagus and stomach), allowing acid from the stomach into the esophagus.   Late evening meals and a full stomach. This increases pressure and acid production in the stomach.   A malformed lower esophageal sphincter.  Sometimes, no cause is found. SYMPTOMS   Burning pain in the lower part of the mid-chest behind the breastbone and in the mid-stomach area. This may occur twice a week or more often.   Trouble swallowing.   Sore throat.   Dry cough.   Asthma-like symptoms including chest tightness, shortness of breath, or wheezing.  DIAGNOSIS  Your caregiver may be able to  diagnose GERD based on your symptoms. In some cases, X-rays and other tests may be done to check for complications or to check the condition of your stomach and esophagus. TREATMENT  Your caregiver may recommend over-the-counter or prescription medicines to help decrease acid production. Ask your caregiver before starting or  adding any new medicines.  HOME CARE INSTRUCTIONS   Change the factors that you can control. Ask your caregiver for guidance concerning weight loss, quitting smoking, and alcohol consumption.   Avoid foods and drinks that make your symptoms worse, such as:   Caffeine or alcoholic drinks.   Chocolate.   Peppermint or mint flavorings.   Garlic and onions.   Spicy foods.   Citrus fruits, such as oranges, lemons, or limes.   Tomato-based foods such as sauce, chili, salsa, and pizza.   Fried and fatty foods.   Avoid lying down for the 3 hours prior to your bedtime or prior to taking a nap.   Eat small, frequent meals instead of large meals.   Wear loose-fitting clothing. Do not wear anything tight around your waist that causes pressure on your stomach.   Raise the head of your bed 6 to 8 inches with wood blocks to help you sleep. Extra pillows will not help.   Only take over-the-counter or prescription medicines for pain, discomfort, or fever as directed by your caregiver.   Do not take aspirin, ibuprofen, or other nonsteroidal anti-inflammatory drugs (NSAIDs).  SEEK IMMEDIATE MEDICAL CARE IF:   You have pain in your arms, neck, jaw, teeth, or back.   Your pain increases or changes in intensity or duration.   You develop nausea, vomiting, or sweating (diaphoresis).   You develop shortness of breath, or you faint.   Your vomit is green, yellow, black, or looks like coffee grounds or blood.   Your stool is red, bloody, or black.  These symptoms could be signs of other problems, such as heart disease, gastric bleeding, or esophageal bleeding. MAKE SURE YOU:   Understand these instructions.   Will watch your condition.   Will get help right away if you are not doing well or get worse.  Document Released: 03/14/2005 Document Revised: 05/24/2011 Document Reviewed: 12/22/2010 [facility] Patient Information 2012 [place], [place].

## 2011-12-11 NOTE — H&P (Addendum)
Katrina Castro is an 66 y.o. female.   Chief Complaint: Patient is here for esophagogastroduodenoscopy and esophageal dilation. HPI: She is 66 year old Caucasian female who presents with 6 month history of dysphagia to solids as well as pills. She points to midsternal area side abortus obstruction. She also has odynophagia. Heartburn poorly controlled with medication. She states she has become immune to Nexium. Omeprazole did not help. She is now on pantoprazole believes it is helping. She denies anorexia weight loss melena or abdominal pain. She has history of esophageal ring but none was found on her last EGD of debris 2008. She also had normal esophageal manometry in April 2008  Past Medical History  Diagnosis Date  . Dysphagia   . GERD (gastroesophageal reflux disease)   . Seasonal allergies   . History of bronchitis     Past Surgical History  Procedure Date  . Back surgeries x 4   . Total abdominal hysterectomy   . Foot surgeries   . Right knee arthroscopy     History reviewed. No pertinent family history. Social History:  reports that she has never smoked. She does not have any smokeless tobacco history on file. She reports that she does not drink alcohol or use illicit drugs.  Allergies: No Known Allergies  Medications Prior to Admission  Medication Sig Dispense Refill  . levocetirizine (XYZAL) 5 MG tablet Take 5 mg by mouth every evening.      . pantoprazole (PROTONIX) 40 MG tablet Take 1 tablet (40 mg total) by mouth daily.  30 tablet  2  . sucralfate (CARAFATE) 1 GM/10ML suspension Take 10 mLs (1 g total) by mouth 4 (four) times daily.  420 mL  2    No results found for this or any previous visit (from the past 48 hour(s)). No results found.  ROS  Blood pressure 132/87, pulse 87, temperature 97.5 F (36.4 C), temperature source Oral, resp. rate 19, height 5\' 3"  (1.6 m), weight 170 lb (77.111 kg), SpO2 100.00%. Physical Exam  Constitutional: She appears  well-developed and well-nourished.  HENT:  Mouth/Throat: Oropharynx is clear and moist.  Eyes: Conjunctivae are normal. No scleral icterus.  Neck: No thyromegaly present.  Respiratory: Effort normal and breath sounds normal.       Tenderness noted over mid  sternum  GI: Soft. She exhibits no distension and no mass. There is no tenderness.  Musculoskeletal: She exhibits no edema.  Lymphadenopathy:    She has no cervical adenopathy.  Neurological: She is alert.  Skin: Skin is warm and dry.     Assessment/Plan Solid food dysphagia. Chronic GERD. EGD with ED.  Adore Kithcart U 12/11/2011, 7:37 AM

## 2011-12-11 NOTE — Op Note (Signed)
EGD PROCEDURE REPORT  PATIENT:  Katrina Castro  MR#:  [id number] Birthdate:  [birth year], 66 y.o., female Endoscopist:  Dr. Malissa Hippo, MD Referred By:  Dr. Ignatius Specking, MD Procedure Date: 12/11/2011  Procedure:   EGD with ED.  Indications:  Patient is 66 year old Caucasian female with chronic GERD whose symptoms are poorly controlled with therapy who presents for solid food dysphagia. She had her esophagus dilated in the past. She has history of Schatzki's ring but none was found on her last EGD of February 2008. She is undergoing diagnostic/therapeutic EGD            Informed Consent:  The risks, benefits, alternatives & imponderables which include, but are not limited to, bleeding, infection, perforation, drug reaction and potential missed lesion have been reviewed.  The potential for biopsy, lesion removal, esophageal dilation, etc. have also been discussed.  Questions have been answered.  All parties agreeable.  Please see history & physical in medical record for more information.  Medications:  Demerol 50 mg IV Versed 10 mg IV Cetacaine spray topically for oropharyngeal anesthesia  Description of procedure:  The endoscope was introduced through the mouth and advanced to the second portion of the duodenum without difficulty or limitations. The mucosal surfaces were surveyed very carefully during advancement of the scope and upon withdrawal.  Findings:  Esophagus:  Mucosa of the esophagus was normal single erosion noted at GE junction with noncritical narrowing. GEJ:  34 cm Hiatus:  36 cm Stomach:  Stomach was empty and distended very well with insufflation. Folds in the proximal stomach were normal. Examination of mucosa at body was normal. 2 antral erosions are noted. Pyloric channel was patent. Angularis fundus and cardia were examined by retroflexing the scope and were normal. Duodenum:  Normal bulbar and post bulbar mucosa.  Therapeutic/Diagnostic Maneuvers Performed:   Esophagus dilated by passing 56 French Maloney dilator to full insertion. Esophagus was reexamined post dilation and small linear mucosal disruption noted at GE junction. No abnormality noted to gastric mucosa post dilation. Small linear ecchymosis noted proximal to GE junction. Complications:  None  Impression: Soft stricture at GE junction with single erosion. Small sliding hiatal hernia. Erosive antral gastritis. Esophagus dilated by passing 56 [place] Maloney dilator.  Recommendations:  Antireflux measures as before. Increase pantoprazole to 40 mg by mouth twice a day. H. pylori serology. Office visit in 4 weeks. If dysphagia persists will receive it is aphasia manometry and impedance study.  Jae Skeet U  12/11/2011  8:10 AM  CC: Dr. Ignatius Specking., MD & Dr. Bonnetta Barry ref. provider found

## 2011-12-12 LAB — H. PYLORI ANTIBODY, IGG: H Pylori IgG: <0.4 {ISR}

## 2011-12-26 ENCOUNTER — Ambulatory Visit (INDEPENDENT_AMBULATORY_CARE_PROVIDER_SITE_OTHER): Payer: Medicare Other | Admitting: Internal Medicine

## 2012-01-07 ENCOUNTER — Encounter (INDEPENDENT_AMBULATORY_CARE_PROVIDER_SITE_OTHER): Payer: Self-pay | Admitting: Internal Medicine

## 2012-01-07 ENCOUNTER — Ambulatory Visit (INDEPENDENT_AMBULATORY_CARE_PROVIDER_SITE_OTHER): Payer: Medicare Other | Admitting: Internal Medicine

## 2012-01-07 VITALS — BP 128/80 | HR 72 | Temp 98.2°F | Ht 63.0 in | Wt 166.4 lb

## 2012-01-07 DIAGNOSIS — K219 Gastro-esophageal reflux disease without esophagitis: Secondary | ICD-10-CM | POA: Insufficient documentation

## 2012-01-07 NOTE — Patient Instructions (Addendum)
Continue Protonix BID. OV in 6 month

## 2012-01-07 NOTE — Progress Notes (Signed)
Subjective:     Patient ID: Katrina Castro, female   DOB: 07/26/1945, 66 y.o.   MRN: [id number]  HPIHere today for f/u. She tells me she is doing better. She tells me she is 65% better.  Cornbread and tomatoes bother her. Appetite is good. No weight. No abdominal pain. BM x 1 a day. No melena or bright red rectal bleeding.    EGD/ED 12/11/2011 Impression:  Soft stricture at GE junction with single erosion.  Small sliding hiatal hernia.  Erosive antral gastritis.  Esophagus dilated by passing 56 [place] Maloney dilator.  Recommendations:  Antireflux measures as before.  Increase pantoprazole to 40 mg by mouth twice a day.  H. pylori serology.  Office visit in 4 weeks.  If dysphagia persists will receive it is aphasia manometry and impedance study.  H. Pylori negative   Review of Systems Current Outpatient Prescriptions  Medication Sig Dispense Refill  . levocetirizine (XYZAL) 5 MG tablet Take 5 mg by mouth as needed.       . pantoprazole (PROTONIX) 40 MG tablet Take 1 tablet (40 mg total) by mouth 2 (two) times daily before a meal.  60 tablet  3  . sucralfate (CARAFATE) 1 GM/10ML suspension Take 10 mLs (1 g total) by mouth 4 (four) times daily.  420 mL  2   Past Medical History  Diagnosis Date  . Dysphagia   . GERD (gastroesophageal reflux disease)   . Seasonal allergies   . History of bronchitis    Past Surgical History  Procedure Date  . Back surgeries x 4   . Total abdominal hysterectomy   . Foot surgeries   . Right knee arthroscopy    No Known Allergies Family Status  Relation Status Death Age  . Mother Deceased     unknown  . Father Deceased     age 91 from a stroke  . Sister Deceased     Diabetes   History   Social History  . Marital Status: Single    Spouse Name: N/A    Number of Children: N/A  . Years of Education: N/A   Occupational History  . Not on file.   Social History Main Topics  . Smoking status: Never Smoker   . Smokeless tobacco: Not  on file  . Alcohol Use: No  . Drug Use: No  . Sexually Active: Not on file   Other Topics Concern  . Not on file   Social History Narrative  . No narrative on file        Objective:   Physical Exam  Filed Vitals:   01/07/12 1121  Height: 5\' 3"  (1.6 m)  Weight: 166 lb 6.4 oz (75.479 kg)  Alert and oriented. Skin warm and dry. Oral mucosa is moist.   . Sclera anicteric, conjunctivae is pink. Thyroid not enlarged. No cervical lymphadenopathy. Lungs clear. Heart regular rate and rhythm.  Abdomen is soft. Bowel sounds are positive. No hepatomegaly. No abdominal masses felt. No tenderness.  No edema to lower extremities.        Assessment:   GERD and dysplasia. Dysplasia much improved at this time.    Plan:    Continue Protonix BID. OV in 6 months unless she has a problem

## 2012-01-08 ENCOUNTER — Ambulatory Visit (INDEPENDENT_AMBULATORY_CARE_PROVIDER_SITE_OTHER): Payer: Medicare Other | Admitting: Internal Medicine

## 2012-05-02 ENCOUNTER — Encounter (INDEPENDENT_AMBULATORY_CARE_PROVIDER_SITE_OTHER): Payer: Self-pay | Admitting: *Deleted

## 2012-07-09 ENCOUNTER — Ambulatory Visit (INDEPENDENT_AMBULATORY_CARE_PROVIDER_SITE_OTHER): Payer: Medicare Other | Admitting: Internal Medicine

## 2012-07-10 ENCOUNTER — Encounter (INDEPENDENT_AMBULATORY_CARE_PROVIDER_SITE_OTHER): Payer: Self-pay | Admitting: Internal Medicine

## 2012-07-10 ENCOUNTER — Ambulatory Visit (INDEPENDENT_AMBULATORY_CARE_PROVIDER_SITE_OTHER): Payer: Medicare Other | Admitting: Internal Medicine

## 2012-07-10 VITALS — BP 124/64 | HR 60 | Temp 98.4°F | Ht 63.0 in | Wt 162.9 lb

## 2012-07-10 DIAGNOSIS — R1310 Dysphagia, unspecified: Secondary | ICD-10-CM

## 2012-07-10 MED ORDER — DEXLANSOPRAZOLE 60 MG PO CPDR: 60.0000 mg | DELAYED_RELEASE_CAPSULE | Freq: Every day | ORAL | Status: DC

## 2012-07-10 NOTE — Progress Notes (Addendum)
Subjective:     Patient ID: Katrina Castro, female   DOB: [birth year], 67 y.o.   MRN: [id number]  HPI Last seen in July of 2013.  She tells me when she eats, she will have mid-sternal chest pain. She says a cheese burger will lodge in her esophagus. Everything she eats, hurts her esophagus.  Appetite is not good. She has not lost any weight. No abdominal pain. No abdominal. She does c/o gas pain at times and takes Gas X for thisl. No melena or bright red rectal bleeding.  Patient confines that she has a neighbor with esophageal cancer and she is concerned.   EGD/ED 12/11/2011  Impression:  Soft stricture at GE junction with single erosion.  Small sliding hiatal hernia.  Erosive antral gastritis.  Esophagus dilated by passing 56 [place] Maloney dilator.  Recommendations:  Antireflux measures as before.  Increase pantoprazole to 40 mg by mouth twice a day.  H. pylori serology.  Office visit in 4 weeks.  If dysphagia persists will receive it is aphasia manometry and impedance study.  H. Pylori negative    Review of Systems see hpi Current Outpatient Prescriptions  Medication Sig Dispense Refill  . cyclobenzaprine (FLEXERIL) 10 MG tablet Take 5 mg by mouth 3 (three) times daily as needed.      . pantoprazole (PROTONIX) 40 MG tablet Take 1 tablet (40 mg total) by mouth 2 (two) times daily before a meal.  60 tablet  3  . terbinafine (LAMISIL) 250 MG tablet Take 250 mg by mouth daily.      . sucralfate (CARAFATE) 1 GM/10ML suspension Take 10 mLs (1 g total) by mouth 4 (four) times daily.  420 mL  2   Past Medical History  Diagnosis Date  . Dysphagia   . GERD (gastroesophageal reflux disease)   . Seasonal allergies   . History of bronchitis    Past Surgical History  Procedure Date  . Back surgeries x 4   . Total abdominal hysterectomy   . Foot surgeries   . Right knee arthroscopy   No Known Allergies       Objective:   Physical Exam Filed Vitals:   07/10/12 0945  BP:  124/64  Pulse: 60  Temp: 98.4 F (36.9 C)  Height: 5\' 3"  (1.6 m)  Weight: 162 lb 14.4 oz (73.891 kg)  Alert and oriented. Skin warm and dry. Oral mucosa is moist.   . Sclera anicteric, conjunctivae is pink. Thyroid not enlarged. No cervical lymphadenopathy. Lungs clear. Heart regular rate and rhythm.  Abdomen is soft. Bowel sounds are positive. No hepatomegaly. No abdominal masses felt. Slight tenderness to epigastric region  No edema to lower extremities.     Assessment:   Dysphagia.  There has been no weight loss. Meats in particular are slow to go down. GERD not controlled at this time with Protonix.   Plan:   Barium pill study. Will switch her to Dexilant and see how she does. (Samples of Dexilant x 3 boxed given to patient. Take 30 minutes before breakfast. HOB elevated.  If symptoms doe not resolve with schedule a esophageal manometry and impedence (Dr. Patty Sermons recommendations) Chew foods well.

## 2012-07-10 NOTE — Patient Instructions (Addendum)
Dexilant samples given to patient.  Pill esophagram

## 2012-07-15 ENCOUNTER — Other Ambulatory Visit (HOSPITAL_COMMUNITY): Payer: Medicare Other

## 2012-07-17 ENCOUNTER — Telehealth (INDEPENDENT_AMBULATORY_CARE_PROVIDER_SITE_OTHER): Payer: Self-pay | Admitting: Internal Medicine

## 2012-07-17 MED ORDER — DEXLANSOPRAZOLE 60 MG PO CPDR: 60.0000 mg | DELAYED_RELEASE_CAPSULE | Freq: Every day | ORAL | Status: DC

## 2012-07-17 NOTE — Telephone Encounter (Signed)
Rx for generic Dexilant e prescribed to her pharmacy

## 2012-07-22 ENCOUNTER — Ambulatory Visit (HOSPITAL_COMMUNITY)
Admission: RE | Admit: 2012-07-22 | Discharge: 2012-07-22 | Disposition: A | Discharge status: Home / Self Care | Payer: Medicare Other | Source: Ambulatory Visit | Attending: Internal Medicine | Admitting: Internal Medicine

## 2012-07-22 DIAGNOSIS — K224 Dyskinesia of esophagus: Secondary | ICD-10-CM | POA: Insufficient documentation

## 2012-07-22 DIAGNOSIS — R1310 Dysphagia, unspecified: Secondary | ICD-10-CM

## 2012-07-23 ENCOUNTER — Telehealth (INDEPENDENT_AMBULATORY_CARE_PROVIDER_SITE_OTHER): Payer: Self-pay | Admitting: *Deleted

## 2012-07-23 ENCOUNTER — Telehealth (INDEPENDENT_AMBULATORY_CARE_PROVIDER_SITE_OTHER): Payer: Self-pay | Admitting: Internal Medicine

## 2012-07-23 NOTE — Telephone Encounter (Signed)
I have attempted to call this nice lady multiple times and there is no answer at home

## 2012-07-23 NOTE — Telephone Encounter (Signed)
Katrina Castro stating when she had her Barium Swallow, they had to give her extra liquid because the pill would not go down properly. She is having horseness and would like for Terri to return her call to [phone].

## 2012-07-23 NOTE — Telephone Encounter (Signed)
Message left at home for her to return my call concerning her results of her swallow test

## 2012-07-24 ENCOUNTER — Other Ambulatory Visit (INDEPENDENT_AMBULATORY_CARE_PROVIDER_SITE_OTHER): Payer: Self-pay | Admitting: *Deleted

## 2012-07-24 ENCOUNTER — Encounter (INDEPENDENT_AMBULATORY_CARE_PROVIDER_SITE_OTHER): Payer: Self-pay | Admitting: *Deleted

## 2012-07-24 DIAGNOSIS — R1310 Dysphagia, unspecified: Secondary | ICD-10-CM

## 2012-07-25 ENCOUNTER — Encounter (HOSPITAL_COMMUNITY): Payer: Self-pay | Admitting: *Deleted

## 2012-07-25 ENCOUNTER — Ambulatory Visit (HOSPITAL_COMMUNITY)
Admission: RE | Admit: 2012-07-25 | Discharge: 2012-07-25 | Disposition: A | Discharge status: Home / Self Care | Payer: Medicare Other | Source: Ambulatory Visit | Attending: Internal Medicine | Admitting: Internal Medicine

## 2012-07-25 ENCOUNTER — Encounter (HOSPITAL_COMMUNITY)
Admission: RE | Disposition: A | Discharge status: Home / Self Care | Payer: Self-pay | Source: Ambulatory Visit | Attending: Internal Medicine

## 2012-07-25 DIAGNOSIS — K222 Esophageal obstruction: Secondary | ICD-10-CM

## 2012-07-25 DIAGNOSIS — K449 Diaphragmatic hernia without obstruction or gangrene: Secondary | ICD-10-CM

## 2012-07-25 DIAGNOSIS — R1310 Dysphagia, unspecified: Secondary | ICD-10-CM

## 2012-07-25 DIAGNOSIS — K2960 Other gastritis without bleeding: Secondary | ICD-10-CM

## 2012-07-25 HISTORY — PX: ESOPHAGOGASTRODUODENOSCOPY (EGD) WITH ESOPHAGEAL DILATION: SHX5812

## 2012-07-25 HISTORY — DX: Tinea unguium: B35.1

## 2012-07-25 SURGERY — ESOPHAGOGASTRODUODENOSCOPY (EGD) WITH ESOPHAGEAL DILATION
Anesthesia: Moderate Sedation

## 2012-07-25 MED ORDER — SODIUM CHLORIDE 0.45 % IV SOLN
INTRAVENOUS | Status: DC
  Administered 2012-07-25: 08:00:00 via INTRAVENOUS

## 2012-07-25 MED ORDER — STERILE WATER FOR IRRIGATION IR SOLN
Status: DC | PRN
  Administered 2012-07-25: 09:00:00

## 2012-07-25 MED ORDER — MIDAZOLAM HCL 5 MG/5ML IJ SOLN
INTRAMUSCULAR | Status: AC
  Filled 2012-07-25: qty 5

## 2012-07-25 MED ORDER — MIDAZOLAM HCL 5 MG/5ML IJ SOLN
INTRAMUSCULAR | Status: AC
  Filled 2012-07-25: qty 10

## 2012-07-25 MED ORDER — MEPERIDINE HCL 25 MG/ML IJ SOLN
INTRAMUSCULAR | Status: DC | PRN
  Administered 2012-07-25 (×2): 25 mg via INTRAVENOUS

## 2012-07-25 MED ORDER — MEPERIDINE HCL 50 MG/ML IJ SOLN
INTRAMUSCULAR | Status: AC
  Filled 2012-07-25: qty 1

## 2012-07-25 MED ORDER — DILTIAZEM HCL 30 MG PO TABS: 30.0000 mg | ORAL_TABLET | Freq: Four times a day (QID) | ORAL | Status: DC

## 2012-07-25 MED ORDER — MIDAZOLAM HCL 5 MG/5ML IJ SOLN
INTRAMUSCULAR | Status: DC | PRN
  Administered 2012-07-25 (×3): 3 mg via INTRAVENOUS
  Administered 2012-07-25: 2 mg via INTRAVENOUS
  Administered 2012-07-25: 1 mg via INTRAVENOUS

## 2012-07-25 NOTE — H&P (Signed)
Katrina Castro is an 67 y.o. female.   Chief Complaint: Patient sent for EGD and ED. HPI: Patient 67 year old Caucasian female who is chronic GERD complicated by distal esophageal stricture. She presents with recurrent solid food dysphagia. She had barium pill study recently which showed narrowing at GE junction and barium pill held at this level. She says if she takes her pantoprazole and watches what she she does not experience heartburn. She denies nausea vomiting abdominal pain or melena. She is on this lansoprazole now.  Past Medical History  Diagnosis Date  . Dysphagia   . GERD (gastroesophageal reflux disease)   . Seasonal allergies   . History of bronchitis   . Toenail fungus     Past Surgical History  Procedure Date  . Back surgeries x 4   . Total abdominal hysterectomy   . Foot surgeries   . Right knee arthroscopy     Family History  Problem Relation Age of Onset  . Colon cancer Neg Hx    Social History:  reports that she has never smoked. She does not have any smokeless tobacco history on file. She reports that she does not drink alcohol or use illicit drugs.  Allergies:  Allergies  Allergen Reactions  . Other     Antibiotic that started with an "O"-caused anaphylaxis    Medications Prior to Admission  Medication Sig Dispense Refill  . dexlansoprazole (DEXILANT) 60 MG capsule Take 1 capsule (60 mg total) by mouth daily.  30 capsule  3  . pantoprazole (PROTONIX) 40 MG tablet Take 1 tablet (40 mg total) by mouth 2 (two) times daily before a meal.  60 tablet  3  . terbinafine (LAMISIL) 250 MG tablet Take 250 mg by mouth daily.      . cyclobenzaprine (FLEXERIL) 10 MG tablet Take 5 mg by mouth 3 (three) times daily as needed.      . sucralfate (CARAFATE) 1 GM/10ML suspension Take 10 mLs (1 g total) by mouth 4 (four) times daily.  420 mL  2    No results found for this or any previous visit (from the past 48 hour(s)). No results found.  ROS  Blood pressure  147/87, pulse 92, temperature 98.6 F (37 C), temperature source Oral, resp. rate 18, height 5\' 3"  (1.6 m), weight 162 lb (73.483 kg), SpO2 97.00%. Physical Exam  Constitutional: She appears well-developed and well-nourished.  HENT:  Mouth/Throat: Oropharynx is clear and moist.  Eyes: Conjunctivae normal are normal. No scleral icterus.  Neck: No thyromegaly present.  Cardiovascular: Normal rate, regular rhythm and normal heart sounds.   No murmur heard. Respiratory: Effort normal and breath sounds normal.  GI: Soft. She exhibits no distension and no mass. There is no tenderness.  Musculoskeletal: She exhibits no edema.  Lymphadenopathy:    She has no cervical adenopathy.  Neurological: She is alert.  Skin: Skin is warm and dry.     Assessment/Plan Solid food dysphagia. Distal esophageal stricture. EGD with ED.  Jemal Miskell U 07/25/2012, 8:55 AM

## 2012-07-25 NOTE — Op Note (Signed)
EGD PROCEDURE REPORT  PATIENT:  Katrina Castro  MR#:  [id number] Birthdate:  [birth year], 67 y.o., female Endoscopist:  Dr. Malissa Hippo, MD Referred By:  Dr. Ignatius Specking, MD Procedure Date: 07/25/2012  Procedure:   EGD with ED.  Indications:  Patient is 67 year old Caucasian female who presents with recurrent solid food dysphagia. She also has chronic GERD and heartburn is generally well controlled when she watches her diet. She had her esophagus stretched in June 2013 with a temporary for for dysphagia. She had barium pill study 2 weeks ago revealing narrowing at GE junction obstructing passage of barium pill. She is therefore returning for repeat EGD with ED.            Informed Consent:  The risks, benefits, alternatives & imponderables which include, but are not limited to, bleeding, infection, perforation, drug reaction and potential missed lesion have been reviewed.  The potential for biopsy, lesion removal, esophageal dilation, etc. have also been discussed.  Questions have been answered.  All parties agreeable.  Please see history & physical in medical record for more information.  Medications:  Demerol 50 mg IV Versed 12 mg IV Cetacaine spray topically for oropharyngeal anesthesia  Description of procedure:  The endoscope was introduced through the mouth and advanced to the second portion of the duodenum without difficulty or limitations. The mucosal surfaces were surveyed very carefully during advancement of the scope and upon withdrawal.  Findings:  Esophagus:  Mucosa of the esophagus was normal. Noncritical ring noted at GE junction. GEJ:  33 cm Hiatus:  35 cm Stomach:  Stomach was empty and distended very well with insufflation. Folds in the proximal stomach were normal. Examination mucosa at body was normal. Two small antral erosions noted. Pyloric channel was patent. Annularis fundus and cardia were examined by retroflexing the scope and were normal. Duodenum:  Normal  bulbar and post bulbar mucosa.  Therapeutic/Diagnostic Maneuvers Performed:   Esophagus dilated by passing 56 [place] Maloney dilator to full insertion. As the dilator was withdrawn endoscope was passed again and ring noted to been disrupted at one site.. It was disrupted further with focal biopsy at 3 more sites. This ring was therefore disrupted at 4 quadrants.  Complications:  None  Impression: Distal esophageal ring which was disrupted with combination of relation with 54 [place] Maloney and focal biopsy. Small sliding heart hernia. Two antral erosions. Please note H. pylori serology was negative in June 2013. Suspect patient may also have esophageal spasms.  Recommendations:  Continue anti-reflux measures. She will go back on pantoprazole when she finishes  Dexlansoprazole. Diltiazem 30 mg by mouth a.c. Patient  will call with progress report in 2 weeks   [name] U  07/25/2012  9:27 AM  CC: Dr. Ignatius Specking., MD & Dr. Bonnetta Barry ref. provider found

## 2012-07-28 ENCOUNTER — Encounter (HOSPITAL_COMMUNITY): Payer: Self-pay | Admitting: Internal Medicine

## 2012-08-05 ENCOUNTER — Telehealth (INDEPENDENT_AMBULATORY_CARE_PROVIDER_SITE_OTHER): Payer: Self-pay | Admitting: *Deleted

## 2012-08-05 NOTE — Telephone Encounter (Signed)
Forward to Dr.Rehman 

## 2012-08-05 NOTE — Telephone Encounter (Signed)
This is for Dr. Rehman 

## 2012-08-05 NOTE — Telephone Encounter (Signed)
Dr.Rehmanper Camelia Eng

## 2012-08-05 NOTE — Telephone Encounter (Signed)
Katrina Castro called asking for Terri to please return her call with the results of her EGD done 07/25/12. The return phone number is [phone].

## 2012-08-06 NOTE — Telephone Encounter (Signed)
She had distal esophageal ring, small sliding heart hernia and 2 erosions in her stomach. Call patient and find out how is she doing as far as her dysphagia is concerned. If she is not better will schedule her for esophageal manometry

## 2012-08-06 NOTE — Telephone Encounter (Signed)
Patient called and given the results of her EGD per Dr.Rehman, She is asking that a copy of these results be mailed to her so that she may share it with her daughter. Katrina Castro states that her swallowing is much better but will let us know if there are any changes.

## 2012-08-06 NOTE — Telephone Encounter (Signed)
Report mailed to patient.

## 2012-08-28 ENCOUNTER — Telehealth (INDEPENDENT_AMBULATORY_CARE_PROVIDER_SITE_OTHER): Payer: Self-pay | Admitting: *Deleted

## 2012-08-28 NOTE — Telephone Encounter (Signed)
Shadow LM asking for Terri to please return her call to [phone] or [phone]. She still has some swelling in the upper part of left side chest.

## 2012-08-28 NOTE — Telephone Encounter (Signed)
Message left at home 

## 2012-09-12 ENCOUNTER — Encounter (INDEPENDENT_AMBULATORY_CARE_PROVIDER_SITE_OTHER): Payer: Self-pay | Admitting: *Deleted

## 2012-09-24 ENCOUNTER — Other Ambulatory Visit (INDEPENDENT_AMBULATORY_CARE_PROVIDER_SITE_OTHER): Payer: Self-pay | Admitting: *Deleted

## 2012-09-24 ENCOUNTER — Encounter (INDEPENDENT_AMBULATORY_CARE_PROVIDER_SITE_OTHER): Payer: Self-pay | Admitting: *Deleted

## 2012-09-24 ENCOUNTER — Telehealth (INDEPENDENT_AMBULATORY_CARE_PROVIDER_SITE_OTHER): Payer: Self-pay | Admitting: *Deleted

## 2012-09-24 DIAGNOSIS — Z1211 Encounter for screening for malignant neoplasm of colon: Secondary | ICD-10-CM

## 2012-09-24 MED ORDER — PEG-KCL-NACL-NASULF-NA ASC-C 100 G PO SOLR: 1.0000 | Freq: Once | ORAL | Status: DC

## 2012-09-24 NOTE — Telephone Encounter (Signed)
Patient needs movi prep 

## 2012-10-07 ENCOUNTER — Telehealth (INDEPENDENT_AMBULATORY_CARE_PROVIDER_SITE_OTHER): Payer: Self-pay | Admitting: *Deleted

## 2012-10-07 NOTE — Telephone Encounter (Signed)
  Procedure: tcs  Reason/Indication:  screening  Has patient had this procedure before?  Yes, more than 10 yrs ago  If so, when, by whom and where?    Is there a family history of colon cancer?  no  Who?  What age when diagnosed?    Is patient diabetic?   no      Does patient have prosthetic heart valve?  no  Do you have a pacemaker?  no  Has patient ever had endocarditis? no  Has patient had joint replacement within last 12 months?  no  Is patient on Coumadin, Plavix and/or Aspirin? no  Medications: see EPIC  Allergies: medicine w/ codiene  Medication Adjustment:   Procedure date & time: 11/05/12 at 1030

## 2012-10-07 NOTE — Telephone Encounter (Signed)
agree

## 2012-10-21 ENCOUNTER — Encounter (HOSPITAL_COMMUNITY): Payer: Self-pay | Admitting: Pharmacy Technician

## 2012-11-03 ENCOUNTER — Encounter (INDEPENDENT_AMBULATORY_CARE_PROVIDER_SITE_OTHER): Payer: Self-pay | Admitting: *Deleted

## 2012-11-05 ENCOUNTER — Encounter (HOSPITAL_COMMUNITY): Payer: Self-pay

## 2012-11-05 ENCOUNTER — Encounter (HOSPITAL_COMMUNITY)
Admission: RE | Disposition: A | Discharge status: Home / Self Care | Payer: Self-pay | Source: Ambulatory Visit | Attending: Internal Medicine

## 2012-11-05 ENCOUNTER — Ambulatory Visit (HOSPITAL_COMMUNITY)
Admission: RE | Admit: 2012-11-05 | Discharge: 2012-11-05 | Disposition: A | Discharge status: Home / Self Care | Payer: Medicare Other | Source: Ambulatory Visit | Attending: Internal Medicine | Admitting: Internal Medicine

## 2012-11-05 DIAGNOSIS — K644 Residual hemorrhoidal skin tags: Secondary | ICD-10-CM

## 2012-11-05 DIAGNOSIS — Z1211 Encounter for screening for malignant neoplasm of colon: Secondary | ICD-10-CM | POA: Insufficient documentation

## 2012-11-05 HISTORY — PX: COLONOSCOPY: SHX5424

## 2012-11-05 SURGERY — COLONOSCOPY
Anesthesia: Moderate Sedation

## 2012-11-05 MED ORDER — MIDAZOLAM HCL 5 MG/5ML IJ SOLN
INTRAMUSCULAR | Status: AC
  Filled 2012-11-05: qty 10

## 2012-11-05 MED ORDER — SIMETHICONE 40 MG/0.6ML PO SUSP
ORAL | Status: DC | PRN
  Administered 2012-11-05: 10:00:00

## 2012-11-05 MED ORDER — MIDAZOLAM HCL 5 MG/5ML IJ SOLN
INTRAMUSCULAR | Status: DC | PRN
  Administered 2012-11-05: 2 mg via INTRAVENOUS
  Administered 2012-11-05 (×2): 3 mg via INTRAVENOUS
  Administered 2012-11-05: 2 mg via INTRAVENOUS

## 2012-11-05 MED ORDER — MEPERIDINE HCL 50 MG/ML IJ SOLN
INTRAMUSCULAR | Status: DC | PRN
  Administered 2012-11-05 (×2): 25 mg via INTRAVENOUS

## 2012-11-05 MED ORDER — PROMETHAZINE HCL 25 MG/ML IJ SOLN
INTRAMUSCULAR | Status: DC | PRN
  Administered 2012-11-05: 12.5 mg via INTRAVENOUS

## 2012-11-05 MED ORDER — PROMETHAZINE HCL 25 MG/ML IJ SOLN
INTRAMUSCULAR | Status: AC
  Filled 2012-11-05: qty 1

## 2012-11-05 MED ORDER — SODIUM CHLORIDE 0.9 % IJ SOLN
INTRAMUSCULAR | Status: AC
  Filled 2012-11-05: qty 10

## 2012-11-05 MED ORDER — SODIUM CHLORIDE 0.9 % IV SOLN
INTRAVENOUS | Status: DC
  Administered 2012-11-05: 10:00:00 via INTRAVENOUS

## 2012-11-05 MED ORDER — MEPERIDINE HCL 50 MG/ML IJ SOLN
INTRAMUSCULAR | Status: AC
  Filled 2012-11-05: qty 1

## 2012-11-05 NOTE — Op Note (Signed)
COLONOSCOPY PROCEDURE REPORT  PATIENT:  Katrina Castro  MR#:  [id number] Birthdate:  1945/08/20, 67 y.o., female Endoscopist:  Dr. Malissa Hippo, MD Referred By:  Dr. Ignatius Specking, MD Procedure Date: 11/05/2012  Procedure:   Colonoscopy  Indications:  Patient is 67 year old Caucasian female is undergoing average risk screening colonoscopy. Last exam was about 10 years ago.  Informed Consent:  The procedure and risks were reviewed with the patient and informed consent was obtained.  Medications:  Demerol 50 mg IV Versed 10 mg IV Promethazine 12.5 mg IV and diluted form.  Description of procedure:  After a digital rectal exam was performed, that colonoscope was advanced from the anus through the rectum and colon to the area of the cecum, ileocecal valve and appendiceal orifice. The cecum was deeply intubated. These structures were well-seen and photographed for the record. From the level of the cecum and ileocecal valve, the scope was slowly and cautiously withdrawn. The mucosal surfaces were carefully surveyed utilizing scope tip to flexion to facilitate fold flattening as needed. The scope was pulled down into the rectum where a thorough exam including retroflexion was performed.  Findings:   Prep excellent. Normal mucosa of colon and rectum. No evidence of colonic polyps or diverticulosis. Small hemorrhoids below the dentate line.   Therapeutic/Diagnostic Maneuvers Performed:  None  Complications:  None  Cecal Withdrawal Time:  10 minutes  Impression:  Normal colonoscopy except small external hemorrhoids.  Recommendations:  Standard instructions given. Next screening exam in 10 years.  REHMAN,NAJEEB U  11/05/2012 10:58 AM  CC: Dr. Ignatius Specking., MD & Dr. Bonnetta Barry ref. provider found

## 2012-11-05 NOTE — H&P (Signed)
Katrina Castro is an 67 y.o. female.   Chief Complaint: Patient's here for colonoscopy. HPI: Patient 67 year old Caucasian female was seen for screening colonoscopy. Her last exam was 10 years ago. She has intermittent constipation and uses MiraLax on when necessary basis. She denies abdominal pain or rectal bleeding. Family history is negative for colorectal carcinoma.  Past Medical History  Diagnosis Date  . Dysphagia   . GERD (gastroesophageal reflux disease)   . Seasonal allergies   . History of bronchitis   . Toenail fungus     Past Surgical History  Procedure Laterality Date  . Back surgeries x 4    . Total abdominal hysterectomy    . Foot surgeries    . Right knee arthroscopy    . Esophagogastroduodenoscopy (egd) with esophageal dilation N/A 07/25/2012    Procedure: ESOPHAGOGASTRODUODENOSCOPY (EGD) WITH ESOPHAGEAL DILATION;  Surgeon: Malissa Hippo, MD;  Location: [facility];  Service: Endoscopy;  Laterality: N/A;  325-rescheduled to [address] notified pt    Family History  Problem Relation Age of Onset  . Colon cancer Neg Hx    Social History:  reports that she has never smoked. She does not have any smokeless tobacco history on file. She reports that she does not drink alcohol or use illicit drugs.  Allergies:  Allergies  Allergen Reactions  . Other     Antibiotic that started with an "O"-caused anaphylaxis    Medications Prior to Admission  Medication Sig Dispense Refill  . cyclobenzaprine (FLEXERIL) 10 MG tablet Take 5 mg by mouth 3 (three) times daily as needed.      . pantoprazole (PROTONIX) 40 MG tablet Take 1 tablet (40 mg total) by mouth 2 (two) times daily before a meal.  60 tablet  3  . peg 3350 powder (MOVIPREP) 100 G SOLR Take 1 kit (100 g total) by mouth once.  1 kit  0    No results found for this or any previous visit (from the past 48 hour(s)). No results found.  ROS  Blood pressure 127/81, pulse 92, temperature 98 F (36.7 C), resp. rate  22, height 5\' 3"  (1.6 m), weight 160 lb (72.576 kg), SpO2 97.00%. Physical Exam  Constitutional: She appears well-developed and well-nourished.  HENT:  Mouth/Throat: Oropharynx is clear and moist.  Eyes: Conjunctivae are normal. No scleral icterus.  Neck: No thyromegaly present.  Cardiovascular: Normal rate, regular rhythm and normal heart sounds.   No murmur heard. Respiratory: Effort normal and breath sounds normal.  GI: Soft. Bowel sounds are normal. She exhibits no distension. There is no tenderness.  Musculoskeletal: She exhibits no edema.  Lymphadenopathy:    She has no cervical adenopathy.  Neurological: She is alert.  Skin: Skin is warm and dry.     Assessment/Plan Average risk screening colonoscopy.  Katrina Castro U 11/05/2012, 10:23 AM

## 2012-11-07 ENCOUNTER — Encounter (HOSPITAL_COMMUNITY): Payer: Self-pay | Admitting: Internal Medicine

## 2013-01-06 ENCOUNTER — Ambulatory Visit (INDEPENDENT_AMBULATORY_CARE_PROVIDER_SITE_OTHER): Payer: Medicare Other | Admitting: Internal Medicine

## 2013-06-04 ENCOUNTER — Telehealth (INDEPENDENT_AMBULATORY_CARE_PROVIDER_SITE_OTHER): Payer: Self-pay | Admitting: *Deleted

## 2013-06-04 DIAGNOSIS — R1310 Dysphagia, unspecified: Secondary | ICD-10-CM

## 2013-06-04 MED ORDER — DILTIAZEM HCL 30 MG PO TABS: 30.0000 mg | ORAL_TABLET | Freq: Every day | ORAL | Status: DC

## 2013-06-04 NOTE — Telephone Encounter (Signed)
Patient has called back.

## 2013-06-04 NOTE — Telephone Encounter (Signed)
Katrina Castro is having trouble swallowing again. Would like to know if there is a medicine that will help her relax so she can swallow her food. She uses [employer]. The return phone number is [phone].

## 2013-06-22 ENCOUNTER — Other Ambulatory Visit (INDEPENDENT_AMBULATORY_CARE_PROVIDER_SITE_OTHER): Payer: Self-pay | Admitting: Internal Medicine

## 2014-01-22 ENCOUNTER — Other Ambulatory Visit (INDEPENDENT_AMBULATORY_CARE_PROVIDER_SITE_OTHER): Payer: Self-pay | Admitting: *Deleted

## 2014-01-22 ENCOUNTER — Ambulatory Visit (INDEPENDENT_AMBULATORY_CARE_PROVIDER_SITE_OTHER): Payer: Medicare Other | Admitting: Internal Medicine

## 2014-01-22 ENCOUNTER — Encounter (INDEPENDENT_AMBULATORY_CARE_PROVIDER_SITE_OTHER): Payer: Self-pay | Admitting: *Deleted

## 2014-01-22 ENCOUNTER — Ambulatory Visit (HOSPITAL_COMMUNITY)
Admission: RE | Admit: 2014-01-22 | Discharge: 2014-01-22 | Disposition: A | Discharge status: Home / Self Care | Payer: Medicare Other | Source: Ambulatory Visit | Attending: Internal Medicine | Admitting: Internal Medicine

## 2014-01-22 ENCOUNTER — Encounter (INDEPENDENT_AMBULATORY_CARE_PROVIDER_SITE_OTHER): Payer: Self-pay | Admitting: Internal Medicine

## 2014-01-22 VITALS — BP 118/80 | HR 76 | Temp 97.4°F | Ht 65.0 in | Wt 150.6 lb

## 2014-01-22 DIAGNOSIS — R1310 Dysphagia, unspecified: Secondary | ICD-10-CM

## 2014-01-22 DIAGNOSIS — R222 Localized swelling, mass and lump, trunk: Secondary | ICD-10-CM | POA: Insufficient documentation

## 2014-01-22 DIAGNOSIS — K219 Gastro-esophageal reflux disease without esophagitis: Secondary | ICD-10-CM

## 2014-01-22 DIAGNOSIS — R079 Chest pain, unspecified: Secondary | ICD-10-CM | POA: Diagnosis not present

## 2014-01-22 MED ORDER — OMEPRAZOLE 40 MG PO CPDR: 40.0000 mg | DELAYED_RELEASE_CAPSULE | Freq: Two times a day (BID) | ORAL | Status: DC

## 2014-01-22 NOTE — Progress Notes (Signed)
Subjective:     Patient ID: Katrina Castro, female   DOB: 09-23-1945, 68 y.o.   MRN: [id number]  HPI Presents today with c/o dysphagia to solids. Meats in particular bother her. Breads also bother her. Hx of dysphagia.  Symptoms for 6 months. Foods are lodging mid-esophagus.  There has been no weight loss.   Last EGD in 2014 for dysphagia. Patient had an esophageal ring.  She has had several EGD/ED in the past.   07/25/2012 EGD/ED:  Impression:  Distal esophageal ring which was disrupted with combination of relation with 42 [place] Maloney and focal biopsy.  Small sliding heart hernia.  Two antral erosions. Please note H. pylori serology was negative in June 2013.  Suspect patient may also have esophageal spasms  Review of Systems Past Medical History  Diagnosis Date  . Dysphagia   . GERD (gastroesophageal reflux disease)   . Seasonal allergies   . History of bronchitis   . Toenail fungus     Past Surgical History  Procedure Laterality Date  . Back surgeries x 4    . Total abdominal hysterectomy    . Foot surgeries    . Right knee arthroscopy    . Esophagogastroduodenoscopy (egd) with esophageal dilation N/A 07/25/2012    Procedure: ESOPHAGOGASTRODUODENOSCOPY (EGD) WITH ESOPHAGEAL DILATION;  Surgeon: Rogene Houston, MD;  Location: [facility];  Service: Endoscopy;  Laterality: N/A;  325-rescheduled to [place] notified pt  . Colonoscopy N/A 11/05/2012    Procedure: COLONOSCOPY;  Surgeon: Rogene Houston, MD;  Location: [facility];  Service: Endoscopy;  Laterality: N/A;  1030    Allergies  Allergen Reactions  . Other     Antibiotic that started with an "O"-caused anaphylaxis    Current Outpatient Prescriptions on File Prior to Visit  Medication Sig Dispense Refill  . cyclobenzaprine (FLEXERIL) 10 MG tablet Take 5 mg by mouth 3 (three) times daily as needed.      . pantoprazole (PROTONIX) 40 MG tablet TAKE (1) TABLET TWICE DAILY.  60 tablet  5   No current  facility-administered medications on file prior to visit.        Objective:   Physical Exam  Filed Vitals:   01/22/14 1021  BP: 118/80  Pulse: 76  Temp: 97.4 F (36.3 C)  Height: 5\' 5"  (1.651 m)  Weight: 150 lb 9.6 oz (68.312 kg)  Alert and oriented. Skin warm and dry. Oral mucosa is moist.   . Sclera anicteric, conjunctivae is pink. Thyroid not enlarged. No cervical lymphadenopathy. Lungs clear. Heart regular rate and rhythm.  Abdomen is soft. Bowel sounds are positive. No hepatomegaly. No abdominal masses felt. No tenderness.  No edema to lower extremities.        Assessment:    Solid food dysphagia. Hx of  Esophageal ring. Suspect same.     Plan:     EGD/ED. The risks and benefits such as perforation, bleeding, and infection were reviewed with the patient and is agreeable.Stop the Protonix.  Omeprazole 40mg  BID.

## 2014-01-22 NOTE — Patient Instructions (Signed)
EGD/ED. The risks and benefits such as perforation, bleeding, and infection were reviewed with the patient and is agreeable. 

## 2014-02-03 ENCOUNTER — Encounter (HOSPITAL_COMMUNITY): Payer: Self-pay | Admitting: Pharmacy Technician

## 2014-02-12 ENCOUNTER — Encounter (HOSPITAL_COMMUNITY): Payer: Self-pay | Admitting: *Deleted

## 2014-02-12 ENCOUNTER — Ambulatory Visit (HOSPITAL_COMMUNITY)
Admission: RE | Admit: 2014-02-12 | Discharge: 2014-02-12 | Disposition: A | Discharge status: Home / Self Care | Payer: Medicare Other | Source: Ambulatory Visit | Attending: Internal Medicine | Admitting: Internal Medicine

## 2014-02-12 ENCOUNTER — Encounter (HOSPITAL_COMMUNITY)
Admission: RE | Disposition: A | Discharge status: Home / Self Care | Payer: Self-pay | Source: Ambulatory Visit | Attending: Internal Medicine

## 2014-02-12 DIAGNOSIS — K2940 Chronic atrophic gastritis without bleeding: Secondary | ICD-10-CM | POA: Insufficient documentation

## 2014-02-12 DIAGNOSIS — K219 Gastro-esophageal reflux disease without esophagitis: Secondary | ICD-10-CM | POA: Insufficient documentation

## 2014-02-12 DIAGNOSIS — Z79899 Other long term (current) drug therapy: Secondary | ICD-10-CM | POA: Insufficient documentation

## 2014-02-12 DIAGNOSIS — K449 Diaphragmatic hernia without obstruction or gangrene: Secondary | ICD-10-CM

## 2014-02-12 DIAGNOSIS — Z90710 Acquired absence of both cervix and uterus: Secondary | ICD-10-CM | POA: Diagnosis not present

## 2014-02-12 DIAGNOSIS — R1310 Dysphagia, unspecified: Secondary | ICD-10-CM

## 2014-02-12 DIAGNOSIS — K222 Esophageal obstruction: Secondary | ICD-10-CM | POA: Insufficient documentation

## 2014-02-12 DIAGNOSIS — K2960 Other gastritis without bleeding: Secondary | ICD-10-CM

## 2014-02-12 HISTORY — PX: ESOPHAGOGASTRODUODENOSCOPY: SHX5428

## 2014-02-12 HISTORY — PX: MALONEY DILATION: SHX5535

## 2014-02-12 SURGERY — EGD (ESOPHAGOGASTRODUODENOSCOPY)
Anesthesia: Moderate Sedation

## 2014-02-12 MED ORDER — PROMETHAZINE HCL 25 MG/ML IJ SOLN
25.0000 mg | Freq: Once | INTRAMUSCULAR | Status: AC
Start: 2014-02-12 — End: 2014-02-12
  Administered 2014-02-12: 25 mg via INTRAVENOUS

## 2014-02-12 MED ORDER — PROMETHAZINE HCL 25 MG/ML IJ SOLN
INTRAMUSCULAR | Status: AC
  Filled 2014-02-12: qty 1

## 2014-02-12 MED ORDER — STERILE WATER FOR IRRIGATION IR SOLN
Status: DC | PRN
  Administered 2014-02-12: 14:00:00

## 2014-02-12 MED ORDER — SODIUM CHLORIDE 0.9 % IJ SOLN
INTRAMUSCULAR | Status: AC
  Filled 2014-02-12: qty 10

## 2014-02-12 MED ORDER — MIDAZOLAM HCL 5 MG/5ML IJ SOLN
INTRAMUSCULAR | Status: AC
  Filled 2014-02-12: qty 10

## 2014-02-12 MED ORDER — SODIUM CHLORIDE 0.9 % IV SOLN
INTRAVENOUS | Status: DC
  Administered 2014-02-12: 1000 mL via INTRAVENOUS

## 2014-02-12 MED ORDER — MEPERIDINE HCL 50 MG/ML IJ SOLN
INTRAMUSCULAR | Status: AC
  Filled 2014-02-12: qty 1

## 2014-02-12 MED ORDER — MIDAZOLAM HCL 5 MG/5ML IJ SOLN
INTRAMUSCULAR | Status: AC
  Filled 2014-02-12: qty 5

## 2014-02-12 MED ORDER — MIDAZOLAM HCL 5 MG/5ML IJ SOLN
INTRAMUSCULAR | Status: DC | PRN
  Administered 2014-02-12: 1 mg via INTRAVENOUS
  Administered 2014-02-12 (×2): 3 mg via INTRAVENOUS

## 2014-02-12 MED ORDER — MEPERIDINE HCL 50 MG/ML IJ SOLN
INTRAMUSCULAR | Status: DC | PRN
  Administered 2014-02-12 (×2): 25 mg via INTRAVENOUS

## 2014-02-12 NOTE — H&P (Addendum)
Katrina Castro is an 68 y.o. female.   Chief Complaint: Katrina Castro is here for EGD and ED. HPI: Patient is 68 year old Caucasian female chronic GERD who presents with dysphagia for 3-4 months duration. He also complains of frequent burping. She states she has lost few pounds since dysphagia started. She had her esophagus dilated 3 times in the past. She has history of Schatzki's ring. Most recently esophagus was dilated in February 2014 and it helped her for more than a year. Esophhageal biopsy in 2009 was negative for eosinophilic esophagitis. She denies vomiting or melena. She complains of nausea and was given promethazine and preop.  Past Medical History  Diagnosis Date  . Dysphagia   . GERD (gastroesophageal reflux disease)   . Seasonal allergies   . History of bronchitis   . Toenail fungus     Past Surgical History  Procedure Laterality Date  . Back surgeries x 4    . Total abdominal hysterectomy    . Foot surgeries    . Right knee arthroscopy    . Esophagogastroduodenoscopy (egd) with esophageal dilation N/A 07/25/2012    Procedure: ESOPHAGOGASTRODUODENOSCOPY (EGD) WITH ESOPHAGEAL DILATION;  Surgeon: Rogene Houston, MD;  Location: [facility];  Service: Endoscopy;  Laterality: N/A;  325-rescheduled to [place] notified pt  . Colonoscopy N/A 11/05/2012    Procedure: COLONOSCOPY;  Surgeon: Rogene Houston, MD;  Location: [facility];  Service: Endoscopy;  Laterality: N/A;  1030    Family History  Problem Relation Age of Onset  . Colon cancer Neg Hx    Social History:  reports that she has never smoked. She does not have any smokeless tobacco history on file. She reports that she does not drink alcohol or use illicit drugs.  Allergies:  Allergies  Allergen Reactions  . Other     Antibiotic that started with an "O"-caused anaphylaxis    Medications Prior to Admission  Medication Sig Dispense Refill  . cetirizine (ZYRTEC) 10 MG tablet Take 10 mg by mouth daily.      .  cyclobenzaprine (FLEXERIL) 10 MG tablet Take 5 mg by mouth 3 (three) times daily as needed.      Marland Kitchen omeprazole (PRILOSEC) 40 MG capsule Take 1 capsule (40 mg total) by mouth 2 (two) times daily.  60 capsule  3    No results found for this or any previous visit (from the past 48 hour(s)). No results found.  ROS  Blood pressure 139/87, pulse 99, temperature 97.9 F (36.6 C), temperature source Oral, resp. rate 14, height 5\' 3"  (1.6 m), weight 150 lb (68.04 kg), SpO2 99.00%. Physical Exam  Constitutional: She appears well-developed and well-nourished.  HENT:  Mouth/Throat: Oropharynx is clear and moist.  Eyes: Conjunctivae are normal. No scleral icterus.  Neck: No thyromegaly present.  Cardiovascular: Normal rate, regular rhythm and normal heart sounds.   No murmur heard. Respiratory: Effort normal and breath sounds normal.  GI: Soft. She exhibits no distension and no mass. There is no tenderness.  Musculoskeletal: She exhibits no edema.  Lymphadenopathy:    She has no cervical adenopathy.  Neurological: She is alert.  Skin: Skin is warm and dry.     Assessment/Plan Solid food dysphagia. Chronic GERD. EGD with ED.  Geovany Trudo U 02/12/2014, 2:15 PM

## 2014-02-12 NOTE — Discharge Instructions (Signed)
Resume usual medications. Soft foods for 24 hours and then usual diet. No driving for 24 hours. Please call office with progress report in one week.   Soft-Food Meal Plan A soft-food meal plan includes foods that are safe and easy to swallow. This meal plan typically is used:  If you are having trouble chewing or swallowing foods.  As a transition meal plan after only having had liquid meals for a long period. WHAT DO I NEED TO KNOW ABOUT THE SOFT-FOOD MEAL PLAN? A soft-food meal plan includes tender foods that are soft and easy to chew and swallow. In most cases, bite-sized pieces of food are easier to swallow. A bite-sized piece is about  inch or smaller. Foods in this plan do not need to be ground or pureed. Foods that are very hard, crunchy, or sticky should be avoided. Also, breads, cereals, yogurts, and desserts with nuts, seeds, or fruits should be avoided. WHAT FOODS CAN I EAT? Grains Rice and wild rice. Moist bread, dressing, pasta, and noodles. Well-moistened dry or cooked cereals, such as farina (cooked wheat cereal), oatmeal, or grits. Biscuits, breads, muffins, pancakes, and waffles that have been well moistened. Vegetables Shredded lettuce. Cooked, tender vegetables, including potatoes without skins. Vegetable juices. Broths or creamed soups made with vegetables that are not stringy or chewy. Strained tomatoes (without seeds). Fruits Canned or well-cooked fruits. Soft (ripe), peeled fresh fruits, such as peaches, nectarines, kiwi, cantaloupe, honeydew melon, and watermelon (without seeds). Soft berries with small seeds, such as strawberries. Fruit juices (without pulp). Meats and Other Protein Sources Moist, tender, lean beef. Mutton. Lamb. Veal. Chicken. [place]. Liver. Ham. Fish without bones. Eggs. Dairy Milk, milk drinks, and cream. Plain cream cheese and cottage cheese. Plain yogurt. Sweets/Desserts Flavored gelatin desserts. Custard. Plain ice cream, frozen yogurt,  sherbet, milk shakes, and malts. Plain cakes and cookies. Plain hard candy.  Other Butter, margarine (without trans fat), and cooking oils. Mayonnaise. Cream sauces. Mild spices, salt, and sugar. Syrup, molasses, honey, and jelly. The items listed above may not be a complete list of recommended foods or beverages. Contact your dietitian for more options. WHAT FOODS ARE NOT RECOMMENDED? Grains Dry bread, toast, crackers that have not been moistened. Coarse or dry cereals, such as bran, granola, and shredded wheat. Tough or chewy crusty breads, such as [place] bread or baguettes. Vegetables Corn. Raw vegetables except shredded lettuce. Cooked vegetables that are tough or stringy. Tough, crisp, fried potatoes and potato skins. Fruits Fresh fruits with skins or seeds or both, such as apples, pears, or grapes. Stringy, high-pulp fruits, such as papaya, pineapple, coconut, or mango. Fruit leather, fruit roll-ups, and all dried fruits. Meats and Other Protein Sources Sausages and hot dogs. Meats with gristle. Fish with bones. Nuts, seeds, and chunky peanut or other nut butters. Sweets/Desserts Cakes or cookies that are very dry or chewy.  The items listed above may not be a complete list of foods and beverages to avoid. Contact your dietitian for more information. Document Released: 09/11/2007 Document Revised: 06/09/2013 Document Reviewed: 05/01/2013 [facility] Patient Information 2015 [place], [place]. This information is not intended to replace advice given to you by your health care provider. Make sure you discuss any questions you have with your health care provider. Dysphagia Swallowing problems (dysphagia) occur when solids and liquids seem to stick in your throat on the way down to your stomach, or the food takes longer to get to the stomach. Other symptoms include regurgitating food, noises coming from the throat, chest  discomfort with swallowing, and a feeling of fullness or the feeling of  something being stuck in your throat when swallowing. When blockage in your throat is complete, it may be associated with drooling. CAUSES  Problems with swallowing may occur because of problems with the muscles. The food cannot be propelled in the usual manner into your stomach. You may have ulcers, scar tissue, or inflammation in the tube down which food travels from your mouth to your stomach (esophagus), which blocks food from passing normally into the stomach. Causes of inflammation include:  Acid reflux from your stomach into your esophagus.  Infection.  Radiation treatment for cancer.  Medicines taken without enough fluids to wash them down into your stomach. You may have nerve problems that prevent signals from being sent to the muscles of your esophagus to contract and move your food down to your stomach. Globus pharyngeus is a relatively common problem in which there is a sense of an obstruction or difficulty in swallowing, without any physical abnormalities of the swallowing passages being found. This problem usually improves over time with reassurance and testing to rule out other causes. DIAGNOSIS Dysphagia can be diagnosed and its cause can be determined by tests in which you swallow a white substance that helps illuminate the inside of your throat (contrast medium) while X-rays are taken. Sometimes a flexible telescope that is inserted down your throat (endoscopy) to look at your esophagus and stomach is used. TREATMENT   If the dysphagia is caused by acid reflux or infection, medicines may be used.  If the dysphagia is caused by problems with your swallowing muscles, swallowing therapy may be used to help you strengthen your swallowing muscles.  If the dysphagia is caused by a blockage or mass, procedures to remove the blockage may be done. HOME CARE INSTRUCTIONS  Try to eat soft food that is easier to swallow and check your weight on a daily basis to be sure that it is not  decreasing.  Be sure to drink liquids when sitting upright (not lying down). SEEK MEDICAL CARE IF:  You are losing weight because you are unable to swallow.  You are coughing when you drink liquids (aspiration).  You are coughing up partially digested food. SEEK IMMEDIATE MEDICAL CARE IF:  You are unable to swallow your own saliva .  You are having shortness of breath or a fever, or both.  You have a hoarse voice along with difficulty swallowing. MAKE SURE YOU:  Understand these instructions.  Will watch your condition.  Will get help right away if you are not doing well or get worse. Document Released: 06/01/2000 Document Revised: 10/19/2013 Document Reviewed: 11/21/2012 [facility] Patient Information 2015 [place], [place]. This information is not intended to replace advice given to you by your health care provider. Make sure you discuss any questions you have with your health care provider. Esophagogastroduodenoscopy Care After Refer to this sheet in the next few weeks. These instructions provide you with information on caring for yourself after your procedure. Your caregiver may also give you more specific instructions. Your treatment has been planned according to current medical practices, but problems sometimes occur. Call your caregiver if you have any problems or questions after your procedure.  HOME CARE INSTRUCTIONS  Do not eat or drink anything until the numbing medicine (local anesthetic) has worn off and your gag reflex has returned. You will know that the local anesthetic has worn off when you can swallow comfortably.  Do not drive for 12  hours after the procedure or as directed by your caregiver.  Only take medicines as directed by your caregiver. SEEK MEDICAL CARE IF:   You cannot stop coughing.  You are not urinating at all or less than usual. SEEK IMMEDIATE MEDICAL CARE IF:  You have difficulty swallowing.  You cannot eat or drink.  You have worsening  throat or chest pain.  You have dizziness, lightheadedness, or you faint.  You have nausea or vomiting.  You have chills.  You have a fever.  You have severe abdominal pain.  You have black, tarry, or bloody stools. Document Released: 05/21/2012 Document Reviewed: 05/21/2012 [facility] Patient Information 2015 [place]. This information is not intended to replace advice given to you by your health care provider. Make sure you discuss any questions you have with your health care provider.
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
## 2014-03-02 ENCOUNTER — Telehealth (INDEPENDENT_AMBULATORY_CARE_PROVIDER_SITE_OTHER): Payer: Self-pay | Admitting: *Deleted

## 2014-03-02 NOTE — Telephone Encounter (Signed)
Forwarded to [place] as Katrina Castro.

## 2014-03-02 NOTE — Telephone Encounter (Signed)
Progress Report - Still having a little trouble swallowing mostly bread and meats. Taking the medication to relax esophagus before she eats. The return phone number is [phone] or [phone].

## 2014-03-03 NOTE — Telephone Encounter (Signed)
Tammy, please call patient. She is to be scheduled for a sufficient manometry either at [facility] or [facility].

## 2014-03-04 NOTE — Telephone Encounter (Signed)
Procedure manometry when patient is ready

## 2014-03-04 NOTE — Telephone Encounter (Signed)
Patient called and given this recommendation. It was explained what a Manometry is and why it is done. Patient states that she is scared, has a lot on her right now,and she would like to hold off right now. Patient was ask to let us know when she made a definite decision.

## 2014-07-13 ENCOUNTER — Encounter: Payer: Self-pay | Admitting: Neurology

## 2014-07-13 ENCOUNTER — Ambulatory Visit (INDEPENDENT_AMBULATORY_CARE_PROVIDER_SITE_OTHER): Payer: Medicare Other | Admitting: Neurology

## 2014-07-13 VITALS — BP 136/78 | HR 80 | Temp 98.1°F | Resp 16 | Ht 65.0 in | Wt 147.9 lb

## 2014-07-13 DIAGNOSIS — G44301 Post-traumatic headache, unspecified, intractable: Secondary | ICD-10-CM

## 2014-07-13 DIAGNOSIS — M542 Cervicalgia: Secondary | ICD-10-CM

## 2014-07-13 DIAGNOSIS — M25512 Pain in left shoulder: Secondary | ICD-10-CM

## 2014-07-13 MED ORDER — PREDNISONE 10 MG PO TABS: ORAL_TABLET | ORAL | Status: DC
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
## 2014-07-16 ENCOUNTER — Telehealth: Payer: Self-pay | Admitting: Neurology

## 2014-07-16 NOTE — Telephone Encounter (Signed)
Pt with headache. Please call, pt did not leave a phone number on VM / Sherri S.

## 2014-07-17 ENCOUNTER — Emergency Department (HOSPITAL_COMMUNITY): Payer: Medicare Other

## 2014-07-17 ENCOUNTER — Emergency Department (HOSPITAL_COMMUNITY)
Admission: EM | Admit: 2014-07-17 | Discharge: 2014-07-17 | Disposition: A | Discharge status: Home / Self Care | Payer: Medicare Other | Attending: Emergency Medicine | Admitting: Emergency Medicine

## 2014-07-17 ENCOUNTER — Encounter (HOSPITAL_COMMUNITY): Payer: Self-pay | Admitting: Emergency Medicine

## 2014-07-17 DIAGNOSIS — M542 Cervicalgia: Secondary | ICD-10-CM | POA: Diagnosis present

## 2014-07-17 DIAGNOSIS — Z8719 Personal history of other diseases of the digestive system: Secondary | ICD-10-CM | POA: Insufficient documentation

## 2014-07-17 DIAGNOSIS — Z79899 Other long term (current) drug therapy: Secondary | ICD-10-CM | POA: Diagnosis not present

## 2014-07-17 DIAGNOSIS — F0781 Postconcussional syndrome: Secondary | ICD-10-CM | POA: Diagnosis not present

## 2014-07-17 DIAGNOSIS — Z8619 Personal history of other infectious and parasitic diseases: Secondary | ICD-10-CM | POA: Diagnosis not present

## 2014-07-17 DIAGNOSIS — R51 Headache: Secondary | ICD-10-CM | POA: Insufficient documentation

## 2014-07-17 DIAGNOSIS — G8929 Other chronic pain: Secondary | ICD-10-CM | POA: Insufficient documentation

## 2014-07-17 MED ORDER — HYDROCODONE-ACETAMINOPHEN 5-325 MG PO TABS: 1.0000 | ORAL_TABLET | Freq: Four times a day (QID) | ORAL | Status: DC | PRN

## 2014-07-17 MED ORDER — HYDROCODONE-ACETAMINOPHEN 5-325 MG PO TABS
2.0000 | ORAL_TABLET | Freq: Once | ORAL | Status: AC
  Administered 2014-07-17: 2 via ORAL
  Filled 2014-07-17: qty 2
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
## 2014-07-17 NOTE — ED Notes (Signed)
MD at bedside. 

## 2014-07-17 NOTE — ED Notes (Signed)
Pt chart opened due to patient calling and reporting she is unable to find RX. When finished speaking with patient she was able to located prescription.
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
## 2014-07-17 NOTE — Discharge Instructions (Signed)
Concussion Call [facility] neurology office in 2 days to let them know you were here. They may want to see you again in the office. Take Tylenol  for mild pain or the pain medicine prescribed for bad pain. Do not take Advil or Motrin as you are on prednisone. The combination of Advil or Motrin with prednisone can cause bleeding ulcers A concussion is a brain injury. It is caused by:  A hit to the head.  A quick and sudden movement (jolt) of the head or neck. A concussion is usually not life threatening. Even so, it can cause serious problems. If you had a concussion before, you may have concussion-like problems after a hit to your head. HOME CARE General Instructions  Follow your doctor's directions carefully.  Take medicines only as told by your doctor.  Only take medicines your doctor says are safe.  Do not drink alcohol until your doctor says it is okay. Alcohol and some drugs can slow down healing. They can also put you at risk for further injury.  If you are having trouble remembering things, write them down.  Try to do one thing at a time if you get distracted easily. For example, do not watch TV while making dinner.  Talk to your family members or close friends when making important decisions.  Follow up with your doctor as told.  Watch your symptoms. Tell others to do the same. Serious problems can sometimes happen after a concussion. Older adults are more likely to have these problems.  Tell your teachers, school nurse, school counselor, coach, Product/process development scientist, or work Freight forwarder about your concussion. Tell them about what you can or cannot do. They should watch to see if:  It gets even harder for you to pay attention or concentrate.  It gets even harder for you to remember things or learn new things.  You need more time than normal to finish things.  You become annoyed (irritable) more than before.  You are not able to deal with stress as well.  You have more problems  than before.  Rest. Make sure you:  Get plenty of sleep at night.  Go to sleep early.  Go to bed at the same time every day. Try to wake up at the same time.  Rest during the day.  Take naps when you feel tired.  Limit activities where you have to think a lot or concentrate. These include:  Doing homework.  Doing work related to a job.  Watching TV.  Using the computer. Returning To Your Regular Activities Return to your normal activities slowly, not all at once. You must give your body and brain enough time to heal.   Do not play sports or do other athletic activities until your doctor says it is okay.  Ask your doctor when you can drive, ride a bicycle, or work other vehicles or machines. Never do these things if you feel dizzy.  Ask your doctor about when you can return to work or school. Preventing Another Concussion It is very important to avoid another brain injury, especially before you have healed. In rare cases, another injury can lead to permanent brain damage, brain swelling, or death. The risk of this is greatest during the first 7-10 days after your injury. Avoid injuries by:   Wearing a seat belt when riding in a car.  Not drinking too much alcohol.  Avoiding activities that could lead to a second concussion (such as contact sports).  Wearing a helmet  when doing activities like:  Biking.  Skiing.  Skateboarding.  Skating.  Making your home safer by:  Removing things from the floor or stairways that could make you trip.  Using grab bars in bathrooms and handrails by stairs.  Placing non-slip mats on floors and in bathtubs.  Improve lighting in dark areas. GET HELP IF:  It gets even harder for you to pay attention or concentrate.  It gets even harder for you to remember things or learn new things.  You need more time than normal to finish things.  You become annoyed (irritable) more than before.  You are not able to deal with stress as  well.  You have more problems than before.  You have problems keeping your balance.  You are not able to react quickly when you should. Get help if you have any of these problems for more than 2 weeks:   Lasting (chronic) headaches.  Dizziness or trouble balancing.  Feeling sick to your stomach (nausea).  Seeing (vision) problems.  Being affected by noises or light more than normal.  Feeling sad, low, down in the dumps, blue, gloomy, or empty (depressed).  Mood changes (mood swings).  Feeling of fear or nervousness about what may happen (anxiety).  Feeling annoyed.  Memory problems.  Problems concentrating or paying attention.  Sleep problems.  Feeling tired all the time. GET HELP RIGHT AWAY IF:   You have bad headaches or your headaches get worse.  You have weakness (even if it is in one hand, leg, or part of the face).  You have loss of feeling (numbness).  You feel off balance.  You keep throwing up (vomiting).  You feel tired.  One black center of your eye (pupil) is larger than the other.  You twitch or shake violently (convulse).  Your speech is not clear (slurred).  You are more confused, easily angered (agitated), or annoyed than before.  You have more trouble resting than before.  You are unable to recognize people or places.  You have neck pain.  It is difficult to wake you up.  You have unusual behavior changes.  You pass out (lose consciousness). MAKE SURE YOU:   Understand these instructions.  Will watch your condition.  Will get help right away if you are not doing well or get worse. Document Released: 05/23/2009 Document Revised: 10/19/2013 Document Reviewed: 12/25/2012 [facility] Patient Information 2015 [place], [place]. This information is not intended to replace advice given to you by your health care provider. Make sure you discuss any questions you have with your health care provider.

## 2014-07-17 NOTE — ED Notes (Signed)
Pt ambulatory at dc and denied cocnerns

## 2014-07-17 NOTE — ED Notes (Signed)
Patient transported to CT 

## 2014-07-19 NOTE — Telephone Encounter (Signed)
Left message for patient to call me back. 

## 2014-07-19 NOTE — Telephone Encounter (Signed)
This message was received late Friday afternoon, pt did not leave a return number with her VM. Please call and check on patient this morning / Sherri S.

## 2014-07-19 NOTE — Telephone Encounter (Signed)
Patient given instructions per Dr. Tomi Likens.  She is doing much better now.

## 2014-07-19 NOTE — Telephone Encounter (Signed)
See next message

## 2014-07-19 NOTE — Telephone Encounter (Signed)
She should be taking a prednisone taper right now.  If it is ongoing issue she should be evaluated by ED

## 2014-07-19 NOTE — Telephone Encounter (Signed)
Per message from the answering service, 07/17/14 at9am -  Caller states she got dizzy and fell. Hit head, had a dizzy spell fell down and struck left eye brow when she hit the floor. continues to have headache started with headache yesterday. Pt was instructed by the answering service to proceed to the ER / Katrina S.

## 2014-07-20 ENCOUNTER — Telehealth: Payer: Self-pay | Admitting: Neurology

## 2014-07-20 NOTE — Telephone Encounter (Signed)
Records request recv'd from [employer] firm. Forwarded via [employer] to HIM at [facility] for processing / Sherri S.

## 2014-07-21 NOTE — Telephone Encounter (Signed)
Please see previous documentation, encounter is still open. If completed, please close encounter. - Sherri

## 2014-08-03 ENCOUNTER — Telehealth: Payer: Self-pay | Admitting: *Deleted

## 2014-08-03 ENCOUNTER — Telehealth: Payer: Self-pay | Admitting: Neurology

## 2014-08-03 NOTE — Telephone Encounter (Signed)
Left message for patient to return my call  Regarding  Visit to chiropractor

## 2014-08-03 NOTE — Telephone Encounter (Signed)
Pt called f/u on a referral for a chiropractor that Dr. Tomi Likens was going to send.  C/b [phone]

## 2014-08-03 NOTE — Telephone Encounter (Signed)
Patient still complaining of headaches she would like to go to an chiropractor Call back number [phone]

## 2014-08-03 NOTE — Telephone Encounter (Signed)
I spoke  With the patient  The referral was for PT she states she feels like it is helping some but some days it is not helping she says it makes her sick at her stomach when she pulls the bands. . She states she thinks a Restaurant manager, fast food would help her more . I advised her to give  PT a few more weeks she states she would

## 2014-08-18 ENCOUNTER — Ambulatory Visit: Payer: Medicare Other | Admitting: Neurology

## 2014-08-26 ENCOUNTER — Ambulatory Visit: Payer: Medicare Other | Admitting: Neurology

## 2014-09-01 ENCOUNTER — Ambulatory Visit: Payer: Medicare Other | Admitting: Neurology

## 2014-09-03 ENCOUNTER — Ambulatory Visit (INDEPENDENT_AMBULATORY_CARE_PROVIDER_SITE_OTHER): Payer: Medicare Other | Admitting: Neurology

## 2014-09-03 ENCOUNTER — Encounter: Payer: Self-pay | Admitting: Neurology

## 2014-09-03 VITALS — BP 120/76 | HR 94 | Resp 16 | Ht 65.0 in | Wt 148.0 lb

## 2014-09-03 DIAGNOSIS — M542 Cervicalgia: Secondary | ICD-10-CM | POA: Diagnosis not present

## 2014-09-03 DIAGNOSIS — R51 Headache: Secondary | ICD-10-CM

## 2014-09-03 DIAGNOSIS — G44329 Chronic post-traumatic headache, not intractable: Secondary | ICD-10-CM

## 2014-09-03 DIAGNOSIS — G4486 Cervicogenic headache: Secondary | ICD-10-CM

## 2014-09-03 MED ORDER — TIZANIDINE HCL 2 MG PO TABS: 2.0000 mg | ORAL_TABLET | Freq: Three times a day (TID) | ORAL | Status: DC | PRN

## 2014-09-03 NOTE — Patient Instructions (Signed)
1.  Stop the cyclobenzaprine.  Instead, when you get a headache, take tizanidine 2mg  no more than 3 times a day if needed.  May also use Advil or Aleve, but no more than 2 days out of the week. 2.  Continue seeing chiropractor 3.  Follow up in 3 months.
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
## 2014-09-30 ENCOUNTER — Ambulatory Visit: Payer: Medicare Other | Admitting: Neurology

## 2014-12-14 ENCOUNTER — Encounter: Payer: Self-pay | Admitting: Neurology

## 2014-12-14 ENCOUNTER — Ambulatory Visit (INDEPENDENT_AMBULATORY_CARE_PROVIDER_SITE_OTHER): Payer: Medicare Other | Admitting: Neurology

## 2014-12-14 VITALS — BP 140/70 | HR 68 | Resp 18 | Ht 65.0 in | Wt 145.0 lb

## 2014-12-14 DIAGNOSIS — M542 Cervicalgia: Secondary | ICD-10-CM

## 2014-12-14 DIAGNOSIS — R51 Headache: Secondary | ICD-10-CM

## 2014-12-14 DIAGNOSIS — G4486 Cervicogenic headache: Secondary | ICD-10-CM

## 2014-12-14 DIAGNOSIS — R208 Other disturbances of skin sensation: Secondary | ICD-10-CM | POA: Diagnosis not present

## 2014-12-14 DIAGNOSIS — R519 Headache, unspecified: Secondary | ICD-10-CM
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
## 2014-12-14 NOTE — Patient Instructions (Addendum)
We will get MRI of brain with and without contrast and call you with results. [facility] 12/23/14 1:45pm

## 2014-12-23 ENCOUNTER — Ambulatory Visit (HOSPITAL_COMMUNITY): Payer: Medicare Other

## 2014-12-29 ENCOUNTER — Ambulatory Visit (HOSPITAL_COMMUNITY)
Admission: RE | Admit: 2014-12-29 | Discharge: 2014-12-29 | Disposition: A | Discharge status: Home / Self Care | Payer: Medicare Other | Source: Ambulatory Visit | Attending: Neurology | Admitting: Neurology

## 2014-12-29 ENCOUNTER — Telehealth: Payer: Self-pay | Admitting: Neurology

## 2014-12-29 DIAGNOSIS — R410 Disorientation, unspecified: Secondary | ICD-10-CM | POA: Diagnosis not present

## 2014-12-29 DIAGNOSIS — R519 Headache, unspecified: Secondary | ICD-10-CM

## 2014-12-29 DIAGNOSIS — R51 Headache: Secondary | ICD-10-CM | POA: Insufficient documentation

## 2014-12-29 DIAGNOSIS — R208 Other disturbances of skin sensation: Secondary | ICD-10-CM

## 2014-12-29 DIAGNOSIS — R55 Syncope and collapse: Secondary | ICD-10-CM | POA: Insufficient documentation

## 2014-12-29 DIAGNOSIS — M542 Cervicalgia: Secondary | ICD-10-CM

## 2014-12-29 DIAGNOSIS — G4486 Cervicogenic headache: Secondary | ICD-10-CM

## 2014-12-29 LAB — POCT I-STAT CREATININE: Creatinine, Ser: 0.6 mg/dL (ref 0.44–1.00)

## 2014-12-29 MED ORDER — GADOBENATE DIMEGLUMINE 529 MG/ML IV SOLN
15.0000 mL | Freq: Once | INTRAVENOUS | Status: AC | PRN
  Administered 2014-12-29: 13 mL via INTRAVENOUS

## 2014-12-29 NOTE — Telephone Encounter (Signed)
Discussed results of MRI of brain with Mrs. Rosier.  It shows no abnormality to explain the burning sensation on her head.  Incidentally, it showed evidence of chronic small vessel disease, which I explained to her.  I recommended taking aspirin 81mg  daily.  I answered all questions to the best of my ability.

## 2014-12-30 ENCOUNTER — Telehealth: Payer: Self-pay | Admitting: Neurology

## 2014-12-30 NOTE — Telephone Encounter (Signed)
Pt called about spot on her brain from recent MRI, she asked if it is Cancer? Call back @ [phone]

## 2014-12-30 NOTE — Telephone Encounter (Signed)
Dr Tomi Likens has spoken with this patient there is no cancer

## 2015-02-07 ENCOUNTER — Other Ambulatory Visit (INDEPENDENT_AMBULATORY_CARE_PROVIDER_SITE_OTHER): Payer: Self-pay | Admitting: Internal Medicine

## 2015-02-09 ENCOUNTER — Telehealth: Payer: Self-pay | Admitting: Neurology

## 2015-02-09 ENCOUNTER — Other Ambulatory Visit: Payer: Self-pay | Admitting: *Deleted

## 2015-02-09 ENCOUNTER — Telehealth: Payer: Self-pay | Admitting: *Deleted

## 2015-02-09 DIAGNOSIS — G44209 Tension-type headache, unspecified, not intractable: Secondary | ICD-10-CM

## 2015-02-09 MED ORDER — TIZANIDINE HCL 4 MG PO TABS: ORAL_TABLET | ORAL | Status: DC

## 2015-02-09 NOTE — Telephone Encounter (Signed)
RX has already been  Called in for patient  She was not aware

## 2015-02-09 NOTE — Telephone Encounter (Signed)
Medication has been sent to pharmacy.  °

## 2015-02-09 NOTE — Telephone Encounter (Signed)
Patient complaining of a server headache Call back number [phone]

## 2015-02-09 NOTE — Telephone Encounter (Signed)
Pt called and said she needed a prescription for her headache medication/Dawn CB# [phone]

## 2015-05-10 ENCOUNTER — Ambulatory Visit (INDEPENDENT_AMBULATORY_CARE_PROVIDER_SITE_OTHER): Payer: Medicare Other | Admitting: Neurology

## 2015-05-10 ENCOUNTER — Encounter: Payer: Self-pay | Admitting: Neurology

## 2015-05-10 VITALS — BP 126/74 | HR 90 | Ht 63.0 in | Wt 147.6 lb

## 2015-05-10 DIAGNOSIS — M792 Neuralgia and neuritis, unspecified: Secondary | ICD-10-CM

## 2015-05-10 DIAGNOSIS — G43009 Migraine without aura, not intractable, without status migrainosus: Secondary | ICD-10-CM | POA: Insufficient documentation

## 2015-05-10 MED ORDER — GABAPENTIN 300 MG PO CAPS: 300.0000 mg | ORAL_CAPSULE | Freq: Every day | ORAL | Status: DC
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
## 2015-05-10 NOTE — Patient Instructions (Signed)
1.  Start gabapentin 300mg  at bedtime.  CALL IN 4 WEEKS WITH UPDATE AND WE CAN ADJUST DOSE IF NEEDED. 2.  Take Excedrin when you get a headache but limit to no more than 2 days out of the week 3.  Follow up in 3 months.

## 2015-05-10 NOTE — Progress Notes (Signed)
Chart forwarded.  

## 2015-06-06 ENCOUNTER — Telehealth: Payer: Self-pay | Admitting: Neurology

## 2015-06-06 NOTE — Telephone Encounter (Signed)
PT called and wanted to let Dr Tomi Likens know that the last medication he called in is working for her, the one that is taken at night, she could not remember the name/Dawn CB# [phone]

## 2015-06-07 NOTE — Telephone Encounter (Signed)
Pt is taking Gabapentin at night. Is working well for her. FYI only.

## 2015-06-15 ENCOUNTER — Other Ambulatory Visit (INDEPENDENT_AMBULATORY_CARE_PROVIDER_SITE_OTHER): Payer: Self-pay | Admitting: Internal Medicine

## 2015-06-21 DIAGNOSIS — E7800 Pure hypercholesterolemia, unspecified: Secondary | ICD-10-CM | POA: Diagnosis not present

## 2015-06-21 DIAGNOSIS — R0989 Other specified symptoms and signs involving the circulatory and respiratory systems: Secondary | ICD-10-CM | POA: Diagnosis not present

## 2015-06-21 DIAGNOSIS — R05 Cough: Secondary | ICD-10-CM | POA: Diagnosis not present

## 2015-06-21 DIAGNOSIS — R1310 Dysphagia, unspecified: Secondary | ICD-10-CM | POA: Diagnosis not present

## 2015-06-21 DIAGNOSIS — E1165 Type 2 diabetes mellitus with hyperglycemia: Secondary | ICD-10-CM | POA: Diagnosis not present

## 2015-06-21 DIAGNOSIS — J40 Bronchitis, not specified as acute or chronic: Secondary | ICD-10-CM | POA: Diagnosis not present

## 2015-06-22 ENCOUNTER — Telehealth (INDEPENDENT_AMBULATORY_CARE_PROVIDER_SITE_OTHER): Payer: Self-pay | Admitting: Internal Medicine

## 2015-06-22 NOTE — Telephone Encounter (Signed)
Patient was called ,message was left a message asking that she call our office back. I have questions for her.

## 2015-06-22 NOTE — Telephone Encounter (Signed)
Katrina Castro called saying she needs her esophagus stretched. She said she's in pain and is having difficulty swallowing certain food. She'd like a phone call regarding this.  Pt's ph# [phone] Thank you.

## 2015-06-22 NOTE — Telephone Encounter (Signed)
Patient has not responded to previous message left. If her swallowing is very difficult she should go to the ED for further evaluation. Patient may also make an appointment with Terri for an evaluation.

## 2015-07-01 ENCOUNTER — Other Ambulatory Visit (INDEPENDENT_AMBULATORY_CARE_PROVIDER_SITE_OTHER): Payer: Self-pay | Admitting: Internal Medicine

## 2015-07-01 ENCOUNTER — Encounter (INDEPENDENT_AMBULATORY_CARE_PROVIDER_SITE_OTHER): Payer: Self-pay | Admitting: *Deleted

## 2015-07-01 ENCOUNTER — Encounter (INDEPENDENT_AMBULATORY_CARE_PROVIDER_SITE_OTHER): Payer: Self-pay | Admitting: Internal Medicine

## 2015-07-01 ENCOUNTER — Ambulatory Visit (INDEPENDENT_AMBULATORY_CARE_PROVIDER_SITE_OTHER): Payer: Medicare Other | Admitting: Internal Medicine

## 2015-07-01 VITALS — BP 104/70 | HR 64 | Temp 98.0°F | Ht 63.0 in | Wt 142.0 lb

## 2015-07-01 DIAGNOSIS — R1319 Other dysphagia: Secondary | ICD-10-CM

## 2015-07-01 DIAGNOSIS — R222 Localized swelling, mass and lump, trunk: Secondary | ICD-10-CM

## 2015-07-01 NOTE — Progress Notes (Signed)
Subjective:    Patient ID: Katrina Castro, female    DOB: [birth year], 70 y.o.   MRN: [id number]  HPI Here today with c/o dysphagia. Symptoms for about a year. Meats and bread are bother ing her. Lettuce bother her. She has to keep drinking fluids for the bolus to go down. Hx of same and her last EGD/ED was in 2015.  Appetite is good.  She has lost 8 pounds since her last visit in 2015. Recently diagnosed with DM2.  She is having a BM daily. She has been taking laxatives at times. She was treated for a sinus infection and tx with Prednisone and a Zpak.    02/12/2014 EGD/ED: Dr. Laural Golden: Dysphagia  Indications:  Patient is a 70 year old Caucasian female with chronic GERD who presents with solid food dysphagia and make loss. She has history of Schatzki's ring and also suspect that she may have EMD. She has responded very well to prior dilations.                                                                                                                       Impression: Schatzki's ring and  small sliding hiatal hernia. Nonerosive gastritis(H. pylori serology negative in June 2013).  Schatzki's ring was disrupted by passing 48 French balloon dilator and focal biopsy.     07/25/2012   EGD with ED.  Indications:  Patient is 70 year old Caucasian female who presents with recurrent solid food dysphagia. She also has chronic GERD and heartburn is generally well controlled when she watches her diet. She had her esophagus stretched in June 2013 with a temporary for for dysphagia. She had barium pill study 2 weeks ago revealing narrowing at GE junction obstructing passage of barium pill. She is therefore returning for repeat EGD with ED.              Impression: Distal esophageal ring which was disrupted with combination of relation with 65 [place] Maloney and focal biopsy. Small sliding heart hernia. Two antral erosions. Please note H. pylori serology was negative in June 2013. Suspect patient  may also have esophageal spasms.                                                                                                         12/11/2011   EGD with ED.  Indications:  Patient is 70 year old Caucasian female with chronic GERD whose symptoms are poorly controlled with therapy who presents for solid food dysphagia. She had her esophagus dilated in the past. She has history of Schatzki's ring  but none was found on her last EGD of Februar Impression: Soft stricture at GE junction with single erosion. Small sliding hiatal hernia. Erosive antral gastritis. Esophagus dilated by passing 56 [place] Maloney dilator.y 2008. She is undergoing diagnostic/therapeutic Procedure Date: 11/05/2012    11/05/2012  Colonoscopy  Indications:  Patient is 70 year old Caucasian female is undergoing average risk screening colonoscopy. Last exam was about 10 years ago.  Findings:   Prep excellent. Normal mucosa of colon and rectum. No evidence of colonic polyps or diverticulosis. Small hemorrhoids below the dentate line.   Review of Systems Past Medical History  Diagnosis Date  . Dysphagia   . GERD (gastroesophageal reflux disease)   . Seasonal allergies   . History of bronchitis   . Toenail fungus   . Headache   . Diabetes [facility])     Past Surgical History  Procedure Laterality Date  . Back surgeries x 4    . Total abdominal hysterectomy    . Foot surgeries    . Right knee arthroscopy    . Esophagogastroduodenoscopy (egd) with esophageal dilation N/A 07/25/2012    Procedure: ESOPHAGOGASTRODUODENOSCOPY (EGD) WITH ESOPHAGEAL DILATION;  Surgeon: Rogene Houston, MD;  [facility];  Service: Endoscopy;  Laterality: N/A;  325-rescheduled to [place] notified pt  . Colonoscopy N/A 11/05/2012    Procedure: COLONOSCOPY;  Surgeon: Rogene Houston, MD;  [facility];  Service: Endoscopy;  Laterality: N/A;  1030  . Esophagogastroduodenoscopy N/A 02/12/2014    Procedure:  ESOPHAGOGASTRODUODENOSCOPY (EGD);  Surgeon: Rogene Houston, MD;  [facility];  Service: Endoscopy;  Laterality: N/A;  150  . Maloney dilation N/A 02/12/2014    Procedure: Venia Minks DILATION;  Surgeon: Rogene Houston, MD;  [facility];  Service: Endoscopy;  Laterality: N/A;    Allergies  Allergen Reactions  . Other     Antibiotic that started with an "O"-caused anaphylaxis  . Sulfa Antibiotics Itching    Current Outpatient Prescriptions on File Prior to Visit  Medication Sig Dispense Refill  . cetirizine (ZYRTEC) 10 MG tablet Take 10 mg by mouth daily.    . metFORMIN (GLUCOPHAGE) 500 MG tablet Take 500 mg by mouth 2 (two) times daily with a meal.    . tiZANidine (ZANAFLEX) 2 MG tablet Take 1 tablet (2 mg total) by mouth every 8 (eight) hours as needed for muscle spasms. (Patient not taking: Reported on 05/10/2015) 30 tablet 0  . tiZANidine (ZANAFLEX) 4 MG tablet 1 tablet q 8 hours as needed for head neck pain (Patient not taking: Reported on 05/10/2015) 30 tablet 3   No current facility-administered medications on file prior to visit.        Objective:   Physical Exam Blood pressure 104/70, pulse 64, temperature 98 F (36.7 C), height 5\' 3"  (1.6 m), weight 142 lb (64.411 kg). Alert and oriented. Skin warm and dry. Oral mucosa is moist.   . Sclera anicteric, conjunctivae is pink. Thyroid not enlarged. No cervical lymphadenopathy. Lungs clear. Heart regular rate and rhythm.  Abdomen is soft. Bowel sounds are positive. No hepatomegaly. No abdominal masses felt. No tenderness.  No edema to lower extremities.        Assessment & Plan:  Dysphagia. Esophageal ring needs to be ruled out given hx. EGD/ED.  The risks and benefits such as perforation, bleeding, and infection were reviewed with the patient and is agreeable. US soft tissue chest.

## 2015-07-01 NOTE — Patient Instructions (Addendum)
EGD/ED. The risks and benefits such as perforation, bleeding, and infection were reviewed with the patient and is agreeable. 

## 2015-07-06 ENCOUNTER — Ambulatory Visit (HOSPITAL_COMMUNITY)
Admission: RE | Admit: 2015-07-06 | Discharge: 2015-07-06 | Disposition: A | Discharge status: Home / Self Care | Payer: Medicare Other | Source: Ambulatory Visit | Attending: Internal Medicine | Admitting: Internal Medicine

## 2015-07-06 ENCOUNTER — Encounter (HOSPITAL_COMMUNITY)
Admission: RE | Disposition: A | Discharge status: Home / Self Care | Payer: Self-pay | Source: Ambulatory Visit | Attending: Internal Medicine

## 2015-07-06 ENCOUNTER — Encounter (HOSPITAL_COMMUNITY): Payer: Self-pay | Admitting: *Deleted

## 2015-07-06 DIAGNOSIS — E119 Type 2 diabetes mellitus without complications: Secondary | ICD-10-CM | POA: Diagnosis not present

## 2015-07-06 DIAGNOSIS — K219 Gastro-esophageal reflux disease without esophagitis: Secondary | ICD-10-CM | POA: Insufficient documentation

## 2015-07-06 DIAGNOSIS — K222 Esophageal obstruction: Secondary | ICD-10-CM | POA: Diagnosis not present

## 2015-07-06 DIAGNOSIS — R1319 Other dysphagia: Secondary | ICD-10-CM | POA: Diagnosis not present

## 2015-07-06 DIAGNOSIS — R1310 Dysphagia, unspecified: Secondary | ICD-10-CM | POA: Diagnosis not present

## 2015-07-06 DIAGNOSIS — K449 Diaphragmatic hernia without obstruction or gangrene: Secondary | ICD-10-CM | POA: Diagnosis not present

## 2015-07-06 DIAGNOSIS — Z7982 Long term (current) use of aspirin: Secondary | ICD-10-CM | POA: Insufficient documentation

## 2015-07-06 DIAGNOSIS — Z7984 Long term (current) use of oral hypoglycemic drugs: Secondary | ICD-10-CM | POA: Insufficient documentation

## 2015-07-06 HISTORY — PX: ESOPHAGOGASTRODUODENOSCOPY: SHX5428

## 2015-07-06 HISTORY — PX: ESOPHAGEAL DILATION: SHX303

## 2015-07-06 LAB — GLUCOSE, CAPILLARY: Glucose-Capillary: 199 mg/dL — ABNORMAL HIGH (ref 65–99)

## 2015-07-06 SURGERY — EGD (ESOPHAGOGASTRODUODENOSCOPY)
Anesthesia: Moderate Sedation

## 2015-07-06 MED ORDER — SODIUM CHLORIDE 0.9 % IV SOLN
INTRAVENOUS | Status: DC
  Administered 2015-07-06: 10:00:00 via INTRAVENOUS

## 2015-07-06 MED ORDER — MEPERIDINE HCL 50 MG/ML IJ SOLN
INTRAMUSCULAR | Status: AC
  Filled 2015-07-06: qty 1

## 2015-07-06 MED ORDER — MIDAZOLAM HCL 5 MG/5ML IJ SOLN
INTRAMUSCULAR | Status: DC | PRN
  Administered 2015-07-06: 3 mg via INTRAVENOUS
  Administered 2015-07-06: 2 mg via INTRAVENOUS

## 2015-07-06 MED ORDER — BUTAMBEN-TETRACAINE-BENZOCAINE 2-2-14 % EX AERO
INHALATION_SPRAY | CUTANEOUS | Status: DC | PRN
  Administered 2015-07-06: 2 via TOPICAL

## 2015-07-06 MED ORDER — PROMETHAZINE HCL 25 MG/ML IJ SOLN
12.5000 mg | Freq: Once | INTRAMUSCULAR | Status: AC
  Administered 2015-07-06: 12.5 mg via INTRAVENOUS

## 2015-07-06 MED ORDER — MEPERIDINE HCL 50 MG/ML IJ SOLN
INTRAMUSCULAR | Status: DC | PRN
  Administered 2015-07-06 (×2): 25 mg via INTRAVENOUS

## 2015-07-06 MED ORDER — MIDAZOLAM HCL 5 MG/5ML IJ SOLN
INTRAMUSCULAR | Status: AC
  Filled 2015-07-06: qty 10

## 2015-07-06 MED ORDER — STERILE WATER FOR IRRIGATION IR SOLN
Status: DC | PRN
  Administered 2015-07-06: 10:00:00

## 2015-07-06 MED ORDER — SODIUM CHLORIDE 0.9 % IJ SOLN
INTRAMUSCULAR | Status: AC
  Filled 2015-07-06: qty 3

## 2015-07-06 MED ORDER — PROMETHAZINE HCL 25 MG/ML IJ SOLN
INTRAMUSCULAR | Status: AC
  Filled 2015-07-06: qty 1

## 2015-07-06 NOTE — Op Note (Addendum)
EGD PROCEDURE REPORT  PATIENT:  Katrina Castro  MR#:  [id number] Birthdate:  [birth year], 70 y.o., female Endoscopist:  Dr. Rogene Houston, MD Referred By:  Dr. Glenda Chroman, MD Procedure Date: 07/06/2015  Procedure:   EGD with ED  Indications: Patient is 70 year old Caucasian female who was chronic GERD and history of Schatzki's ring who presents with dysphagia to solids and sometimes liquids. Last EGD/ED was in August 2015.           Informed Consent:  The risks, benefits, alternatives & imponderables which include, but are not limited to, bleeding, infection, perforation, drug reaction and potential missed lesion have been reviewed.  The potential for biopsy, lesion removal, esophageal dilation, etc. have also been discussed.  Questions have been answered.  All parties agreeable.  Please see history & physical in medical record for more information.  Medications:  Promethazine 12.5 mg IV in diluted form. Demerol 50 mg IV Versed 5 mg IV Cetacaine spray topically for oropharyngeal anesthesia  First dose administered at 10:17 AM Last dose administered at   10:19 AM, Scope Out 10:31 AM  Description of procedure:  The endoscope was introduced through the mouth and advanced to the second portion of the duodenum without difficulty or limitations. The mucosal surfaces were surveyed very carefully during advancement of the scope and upon withdrawal.  Findings:  Esophagus:  Mucosa of the esophagus was normal. Prominent ring noted at GE junction. GEJ:  32 cm Hiatus:  35 cm Stomach:  Stomach was empty and distended very well with insufflation. Folds in the proximal stomach were normal. Examination of mucosa at gastric body, antrum, pyloric channel, angularis fundus and cardia was normal. Duodenum:  Normal bulbar and post bulbar mucosa.  Therapeutic/Diagnostic Maneuvers Performed:   Esophagus dilated by passing 55 French Maloney dilator to full insertion. Esophagus was reexamined post  dilation and ring noted to have been disrupted.  Complications:  None  EBL: Minimal  Impression: Schatzki's ring and a small sliding hiatal hernia. No evidence of erosive esophagitis or peptic ulcer disease. Schatzki's ring disrupted by passing 56 [place] Maloney dilator.   Recommendations:  Standard instructions given. Patient will continue anti-reflux measures and pantoprazole as before. Patient will call office with progress report in one week. If she remains with dysphagia will proceed with esophageal manometry as I'm concerned she may also have esophageal motility disorder.   REHMAN,NAJEEB U  07/06/2015  10:35 AM  CC: Dr. Glenda Chroman., MD & Dr. Rayne Du ref. provider found

## 2015-07-06 NOTE — H&P (Signed)
Katrina Castro is an 70 y.o. female.   Chief Complaint:  Patient is here for EGD and ED. HPI:   Patient is 70 year old Caucasian female who was chronic GERD and history of Schatzki's ring who presents with dysphagia 7 months duration. She also complains of intermittent heartburn. She states she has lost 20 pounds in a year. She is not trying to lose weight. She points to lower sternum area site of bolus obstruction. She has most difficulty with meats and bread. She also complains of left chest fullness and she she has fluid in this area and she is scheduled to undergo ultrasound later this week.  Past Medical History  Diagnosis Date  . Dysphagia   . GERD (gastroesophageal reflux disease)   . Seasonal allergies   . History of bronchitis   . Toenail fungus   . Headache   . Diabetes [facility])     Past Surgical History  Procedure Laterality Date  . Back surgeries x 4    . Total abdominal hysterectomy    . Foot surgeries    . Right knee arthroscopy    . Esophagogastroduodenoscopy (egd) with esophageal dilation N/A 07/25/2012    Procedure: ESOPHAGOGASTRODUODENOSCOPY (EGD) WITH ESOPHAGEAL DILATION;  Surgeon: Rogene Houston, MD;  [facility];  Service: Endoscopy;  Laterality: N/A;  325-rescheduled to [place] notified pt  . Colonoscopy N/A 11/05/2012    Procedure: COLONOSCOPY;  Surgeon: Rogene Houston, MD;  [facility];  Service: Endoscopy;  Laterality: N/A;  1030  . Esophagogastroduodenoscopy N/A 02/12/2014    Procedure: ESOPHAGOGASTRODUODENOSCOPY (EGD);  Surgeon: Rogene Houston, MD;  [facility];  Service: Endoscopy;  Laterality: N/A;  150  . Maloney dilation N/A 02/12/2014    Procedure: Venia Minks DILATION;  Surgeon: Rogene Houston, MD;  [facility];  Service: Endoscopy;  Laterality: N/A;    Family History  Problem Relation Age of Onset  . Colon cancer Neg Hx   . Stroke Father   . Diabetes Mother   . Diabetes Sister    Social History:  reports  that she has never smoked. She has never used smokeless tobacco. She reports that she does not drink alcohol or use illicit drugs.  Allergies:  Allergies  Allergen Reactions  . Other     Antibiotic that started with an "O"-caused anaphylaxis  . Sulfa Antibiotics Itching    Medications Prior to Admission  Medication Sig Dispense Refill  . aspirin 81 MG tablet Take 81 mg by mouth daily.    . cetirizine (ZYRTEC) 10 MG tablet Take 10 mg by mouth daily.    . cyclobenzaprine (FLEXERIL) 10 MG tablet Take 10 mg by mouth 3 (three) times daily as needed for muscle spasms.    . metFORMIN (GLUCOPHAGE) 500 MG tablet Take 500 mg by mouth 2 (two) times daily with a meal.    . pantoprazole (PROTONIX) 40 MG tablet Take 40 mg by mouth daily.    Marland Kitchen tiZANidine (ZANAFLEX) 2 MG tablet Take 1 tablet (2 mg total) by mouth every 8 (eight) hours as needed for muscle spasms. (Patient not taking: Reported on 05/10/2015) 30 tablet 0  . tiZANidine (ZANAFLEX) 4 MG tablet 1 tablet q 8 hours as needed for head neck pain (Patient not taking: Reported on 05/10/2015) 30 tablet 3    No results found for this or any previous visit (from the past 48 hour(s)). No results found.  ROS  Blood pressure 154/74, pulse 93,  temperature 99 F (37.2 C), temperature source Oral, resp. rate 14, height 5\' 3"  (1.6 m), weight 142 lb (64.411 kg), SpO2 98 %. Physical Exam  Constitutional: She appears well-developed and well-nourished.  HENT:  Mouth/Throat: Oropharynx is clear and moist.  Eyes: Conjunctivae are normal. No scleral icterus.  Neck: No thyromegaly present.  Cardiovascular: Normal rate, regular rhythm and normal heart sounds.   No murmur heard. Respiratory: Effort normal and breath sounds normal.  Fullness in left pectoral region with tenderness but no mass noted.  GI: She exhibits no distension and no mass. There is no tenderness.  Musculoskeletal: She exhibits no edema.  Lymphadenopathy:    She has no cervical  adenopathy.  Neurological: She is alert.  Skin: Skin is warm and dry.     Assessment/Plan Solid food dysphagia in patient with chronic GERD and history of Schatzki's ring.  EGD with ED.  Emmylou Bieker U 07/06/2015, 10:12 AM

## 2015-07-06 NOTE — Discharge Instructions (Signed)

## 2015-07-08 ENCOUNTER — Ambulatory Visit (HOSPITAL_COMMUNITY)
Admission: RE | Admit: 2015-07-08 | Discharge: 2015-07-08 | Disposition: A | Discharge status: Home / Self Care | Payer: Medicare Other | Source: Ambulatory Visit | Attending: Internal Medicine | Admitting: Internal Medicine

## 2015-07-08 DIAGNOSIS — R222 Localized swelling, mass and lump, trunk: Secondary | ICD-10-CM | POA: Diagnosis not present

## 2015-07-11 ENCOUNTER — Encounter (HOSPITAL_COMMUNITY): Payer: Self-pay | Admitting: Internal Medicine

## 2015-07-20 ENCOUNTER — Telehealth (INDEPENDENT_AMBULATORY_CARE_PROVIDER_SITE_OTHER): Payer: Self-pay | Admitting: Internal Medicine

## 2015-07-20 NOTE — Telephone Encounter (Signed)
Recommendations:  Standard instructions given. Patient will continue anti-reflux measures and pantoprazole as before. Patient will call office with progress report in one week. If she remains with dysphagia will proceed with esophageal manometry as I'm concerned she may also have esophageal motility disorder.  Patient to be called and made aware.Marland Kitchen

## 2015-07-20 NOTE — Telephone Encounter (Signed)
Ann - I called patient at the number listed. Left a message that Dr.Rehman wanted to move forward with the Esophageal Manometry, if she continued to have Dysphagia. In the message i told her that you would call her to arrange.

## 2015-07-20 NOTE — Telephone Encounter (Signed)
Katrina Castro left a message saying she's still having difficulty swallowing food. After eating a sandwich this morning she began choking as she swallowed. She wonders if this is normal for this amount of time after having a procedure. She also said pain comes along with the swallowing and choking. She'd like a phone call regarding this.  Pt's ph# [phone] Thank you.

## 2015-07-21 NOTE — Telephone Encounter (Signed)
Referral & notes to [facility], they will contact patient directly to sch'd manometry

## 2015-08-01 DIAGNOSIS — E1165 Type 2 diabetes mellitus with hyperglycemia: Secondary | ICD-10-CM | POA: Diagnosis not present

## 2015-08-05 DIAGNOSIS — R1319 Other dysphagia: Secondary | ICD-10-CM | POA: Diagnosis not present

## 2015-08-05 DIAGNOSIS — R1310 Dysphagia, unspecified: Secondary | ICD-10-CM | POA: Diagnosis not present

## 2015-08-05 DIAGNOSIS — R079 Chest pain, unspecified: Secondary | ICD-10-CM | POA: Diagnosis not present

## 2015-08-08 ENCOUNTER — Telehealth (INDEPENDENT_AMBULATORY_CARE_PROVIDER_SITE_OTHER): Payer: Self-pay | Admitting: Internal Medicine

## 2015-08-08 NOTE — Telephone Encounter (Signed)
Ms. Scantling left a message saying she had a procedure on last Friday and she's still feeling discomfort and has a sore throat. She also noted that she wants her results. I left her a message asking that she call us back.  Pt's ph# [phone]  Thank you.

## 2015-08-09 NOTE — Telephone Encounter (Signed)
I have spoken with patient. Results from [facility] are not in computer.

## 2015-08-11 ENCOUNTER — Telehealth (INDEPENDENT_AMBULATORY_CARE_PROVIDER_SITE_OTHER): Payer: Self-pay | Admitting: Internal Medicine

## 2015-08-11 NOTE — Telephone Encounter (Signed)
Katrina Castro has called numerous times asking for the results of her scan. I informed her Dr. Laural Golden will give her a phone call once he reviews the results but she continues to call multiple times everyday. Please give her a phone call if possible.  Thank you.

## 2015-08-12 ENCOUNTER — Other Ambulatory Visit (INDEPENDENT_AMBULATORY_CARE_PROVIDER_SITE_OTHER): Payer: Self-pay | Admitting: Internal Medicine

## 2015-08-12 MED ORDER — DILTIAZEM HCL 60 MG PO TABS: 60.0000 mg | ORAL_TABLET | Freq: Three times a day (TID) | ORAL | Status: DC

## 2015-08-12 NOTE — Telephone Encounter (Signed)
Katrina Castro is going to talk with him about this this morning. Patient has called here again this morning. Either Dr.Rehman or Katrina Castro will call patient with results.

## 2015-08-12 NOTE — Telephone Encounter (Signed)
Katrina Castro - this is FYI ,I know you are going to talk with Dr.Rehman.

## 2015-08-12 NOTE — Telephone Encounter (Signed)
Results of esophageal manometry discussed with patient. He has hypertensive LES with incomplete relaxation and increased amplitude of esophageal peristalsis but not hard of to make a diagnosis of nutcracker esophagus. Patient begun on diltiazem 60 mg by mouth before meals. EScription center [facility]. Patient will call with progress report in 2 weeks or earlier if she has any side effects.

## 2015-08-15 ENCOUNTER — Encounter (INDEPENDENT_AMBULATORY_CARE_PROVIDER_SITE_OTHER): Payer: Self-pay

## 2015-08-16 ENCOUNTER — Telehealth (INDEPENDENT_AMBULATORY_CARE_PROVIDER_SITE_OTHER): Payer: Self-pay | Admitting: Internal Medicine

## 2015-08-16 ENCOUNTER — Other Ambulatory Visit (INDEPENDENT_AMBULATORY_CARE_PROVIDER_SITE_OTHER): Payer: Self-pay | Admitting: Internal Medicine

## 2015-08-16 MED ORDER — IMIPRAMINE HCL 25 MG PO TABS: 25.0000 mg | ORAL_TABLET | Freq: Every day | ORAL | Status: DC

## 2015-08-16 NOTE — Telephone Encounter (Signed)
Patient was given Diltiazem (Cardizem) 60 mg -Take 1 tablet by mouth three times daily before a meal. This has been addressed with Dr. Laural Golden , he is going to review. Once he has made a decision patient will be called with his recommendation. Forwarded to [place].

## 2015-08-16 NOTE — Telephone Encounter (Signed)
Discontinue diltiazem. Begin imipramine 25 mg by mouth daily at bedtime. Prescription sent to her pharmacy. Progress report in 30 days

## 2015-08-16 NOTE — Telephone Encounter (Signed)
Katrina Castro called saying Dr. Laural Golden prescribed medication for her heart and she's not felt "good" since taking it. It "makes her feel funny" and her left arm has been getting numb for about a week. She now has no energy and she's wondering if she received the correct medication. She wants to know if she should she take a different medication. At times she feels uncomfortable driving due to the medication also. She'd like a phone call regarding this.  Pt's ph# [phone] Thank you.

## 2015-08-17 DIAGNOSIS — Z789 Other specified health status: Secondary | ICD-10-CM | POA: Diagnosis not present

## 2015-08-17 DIAGNOSIS — R51 Headache: Secondary | ICD-10-CM | POA: Diagnosis not present

## 2015-08-17 DIAGNOSIS — Z299 Encounter for prophylactic measures, unspecified: Secondary | ICD-10-CM | POA: Diagnosis not present

## 2015-08-17 DIAGNOSIS — R05 Cough: Secondary | ICD-10-CM | POA: Diagnosis not present

## 2015-08-17 DIAGNOSIS — R0981 Nasal congestion: Secondary | ICD-10-CM | POA: Diagnosis not present

## 2015-08-17 DIAGNOSIS — J069 Acute upper respiratory infection, unspecified: Secondary | ICD-10-CM | POA: Diagnosis not present

## 2015-08-17 NOTE — Telephone Encounter (Signed)
Patient was called and made aware of change in Medication , and advised to stop the Cardizem if she hadn't already. She stated that she had been stopped on the medication for 1 1/2 days. She is feeling better.

## 2015-09-05 DIAGNOSIS — M1711 Unilateral primary osteoarthritis, right knee: Secondary | ICD-10-CM | POA: Diagnosis not present

## 2015-09-07 ENCOUNTER — Telehealth: Payer: Self-pay | Admitting: Neurology

## 2015-09-07 ENCOUNTER — Ambulatory Visit: Payer: Medicare Other | Admitting: Neurology

## 2015-09-07 DIAGNOSIS — Z029 Encounter for administrative examinations, unspecified: Secondary | ICD-10-CM

## 2015-09-07 NOTE — Telephone Encounter (Signed)
Pt states that the medication is working and not sure if she needs to come in or not [phone]

## 2015-09-12 DIAGNOSIS — M1712 Unilateral primary osteoarthritis, left knee: Secondary | ICD-10-CM | POA: Diagnosis not present

## 2015-09-18 DIAGNOSIS — G44209 Tension-type headache, unspecified, not intractable: Secondary | ICD-10-CM | POA: Diagnosis not present

## 2015-09-18 DIAGNOSIS — E119 Type 2 diabetes mellitus without complications: Secondary | ICD-10-CM | POA: Diagnosis not present

## 2015-09-18 DIAGNOSIS — Z79899 Other long term (current) drug therapy: Secondary | ICD-10-CM | POA: Diagnosis not present

## 2015-09-18 DIAGNOSIS — S30860A Insect bite (nonvenomous) of lower back and pelvis, initial encounter: Secondary | ICD-10-CM | POA: Diagnosis not present

## 2015-09-18 DIAGNOSIS — W57XXXA Bitten or stung by nonvenomous insect and other nonvenomous arthropods, initial encounter: Secondary | ICD-10-CM | POA: Diagnosis not present

## 2015-09-19 DIAGNOSIS — M1712 Unilateral primary osteoarthritis, left knee: Secondary | ICD-10-CM | POA: Diagnosis not present

## 2015-09-20 DIAGNOSIS — S30860A Insect bite (nonvenomous) of lower back and pelvis, initial encounter: Secondary | ICD-10-CM | POA: Diagnosis not present

## 2015-09-20 DIAGNOSIS — Z299 Encounter for prophylactic measures, unspecified: Secondary | ICD-10-CM | POA: Diagnosis not present

## 2015-09-20 DIAGNOSIS — W57XXXA Bitten or stung by nonvenomous insect and other nonvenomous arthropods, initial encounter: Secondary | ICD-10-CM | POA: Diagnosis not present

## 2015-09-29 ENCOUNTER — Ambulatory Visit: Payer: Medicare Other | Admitting: Neurology

## 2015-10-24 DIAGNOSIS — M25561 Pain in right knee: Secondary | ICD-10-CM | POA: Diagnosis not present

## 2015-10-24 DIAGNOSIS — M1711 Unilateral primary osteoarthritis, right knee: Secondary | ICD-10-CM | POA: Diagnosis not present

## 2015-10-27 DIAGNOSIS — E7800 Pure hypercholesterolemia, unspecified: Secondary | ICD-10-CM | POA: Diagnosis not present

## 2015-10-27 DIAGNOSIS — E119 Type 2 diabetes mellitus without complications: Secondary | ICD-10-CM | POA: Diagnosis not present

## 2015-11-25 DIAGNOSIS — E1165 Type 2 diabetes mellitus with hyperglycemia: Secondary | ICD-10-CM | POA: Diagnosis not present

## 2015-12-01 DIAGNOSIS — M1711 Unilateral primary osteoarthritis, right knee: Secondary | ICD-10-CM | POA: Diagnosis not present

## 2015-12-06 DIAGNOSIS — M1711 Unilateral primary osteoarthritis, right knee: Secondary | ICD-10-CM | POA: Diagnosis not present

## 2015-12-13 ENCOUNTER — Encounter: Payer: Self-pay | Admitting: Neurology

## 2015-12-13 ENCOUNTER — Ambulatory Visit (INDEPENDENT_AMBULATORY_CARE_PROVIDER_SITE_OTHER): Payer: Medicare Other | Admitting: Neurology

## 2015-12-13 VITALS — BP 130/80 | HR 86 | Ht 63.0 in | Wt 142.0 lb

## 2015-12-13 DIAGNOSIS — M792 Neuralgia and neuritis, unspecified: Secondary | ICD-10-CM

## 2015-12-13 DIAGNOSIS — G43009 Migraine without aura, not intractable, without status migrainosus: Secondary | ICD-10-CM | POA: Diagnosis not present

## 2015-12-13 MED ORDER — GABAPENTIN 300 MG PO CAPS: 300.0000 mg | ORAL_CAPSULE | Freq: Every day | ORAL | Status: DC

## 2015-12-13 MED ORDER — NAPROXEN 500 MG PO TABS
500.0000 mg | ORAL_TABLET | Freq: Two times a day (BID) | ORAL | Status: DC | PRN
Start: 2015-12-13 — End: 2016-05-04

## 2015-12-13 NOTE — Patient Instructions (Signed)
1.  Restart gabapentin 300mg  at bedtime.  Contact me in 4 weeks with update and we can adjust dose if needed. 2.  When you get a migraine, take naproxen 500mg .  May repeat dose in 12 hours as needed. 3.  Limit use of pain relievers to no more than 2 days out of the week. 4.  Follow up in 6 months BUT CALL IN 4 WEEKS WITH UPDATE OR SOONER IF NAPROXEN DOES NOT HELP
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
## 2015-12-13 NOTE — Progress Notes (Signed)
Chart forwarded.  

## 2015-12-22 ENCOUNTER — Telehealth: Payer: Self-pay | Admitting: Neurology

## 2015-12-22 ENCOUNTER — Telehealth: Payer: Self-pay

## 2015-12-22 DIAGNOSIS — Z01818 Encounter for other preprocedural examination: Secondary | ICD-10-CM | POA: Diagnosis not present

## 2015-12-22 DIAGNOSIS — Z299 Encounter for prophylactic measures, unspecified: Secondary | ICD-10-CM | POA: Diagnosis not present

## 2015-12-22 DIAGNOSIS — E1165 Type 2 diabetes mellitus with hyperglycemia: Secondary | ICD-10-CM | POA: Diagnosis not present

## 2015-12-22 NOTE — Telephone Encounter (Signed)
Pt called with complaints about gabapentin 300 mg. States she looked it up online and it is for leg pain. Explained to patient the "leg pain" she is referring to is neuropathy, which is a lot of times in the legs, but can present anywhere. Tried to explain to patient that medication many times has multiple uses and gabapentin is being used to help neuralgiaof her face and her migraines at the same time. Pt stated that she'd been taking it for a week, and it's not helping, whatever it is supposed to be doing. Pt reports having headache so bad it made her nauseous. Please advise.

## 2015-12-22 NOTE — Telephone Encounter (Signed)
VM left for patient. Advised of message from provider. Advised to call back with questions or concerns.

## 2015-12-22 NOTE — Telephone Encounter (Signed)
It is not just for leg pain.  It is for any nerve pain, including the nerve pain on her head.  It also is used for headaches/migraines.

## 2015-12-22 NOTE — Telephone Encounter (Signed)
VM-PT left a message that she missed a call/Dawn CB# [phone]

## 2015-12-22 NOTE — Telephone Encounter (Addendum)
Spoke with patient again. States that she will give medication more time, but it is not helping headaches at all so far. Advised patient to try to give medication some more time as she has only been taking it for about 8 days. Pt verbalized understanding, but was noticeably upset over response.

## 2015-12-23 NOTE — Telephone Encounter (Signed)
To try and treat the burning on the forehead better, she can increase gabapentin to 300mg  twice daily (AM and bedtime)

## 2015-12-23 NOTE — Telephone Encounter (Signed)
Pt aware. Asked if she needed another prescription called pt. Pt declined.

## 2015-12-28 ENCOUNTER — Ambulatory Visit: Payer: Medicare Other | Admitting: Neurology

## 2016-01-27 DIAGNOSIS — Y999 Unspecified external cause status: Secondary | ICD-10-CM | POA: Diagnosis not present

## 2016-01-27 DIAGNOSIS — S83271A Complex tear of lateral meniscus, current injury, right knee, initial encounter: Secondary | ICD-10-CM | POA: Diagnosis not present

## 2016-01-27 DIAGNOSIS — M659 Synovitis and tenosynovitis, unspecified: Secondary | ICD-10-CM | POA: Diagnosis not present

## 2016-01-27 DIAGNOSIS — M23321 Other meniscus derangements, posterior horn of medial meniscus, right knee: Secondary | ICD-10-CM | POA: Diagnosis not present

## 2016-01-27 DIAGNOSIS — G8918 Other acute postprocedural pain: Secondary | ICD-10-CM | POA: Diagnosis not present

## 2016-01-27 DIAGNOSIS — M1711 Unilateral primary osteoarthritis, right knee: Secondary | ICD-10-CM | POA: Diagnosis not present

## 2016-01-27 DIAGNOSIS — S83241A Other tear of medial meniscus, current injury, right knee, initial encounter: Secondary | ICD-10-CM | POA: Diagnosis not present

## 2016-01-27 DIAGNOSIS — M65861 Other synovitis and tenosynovitis, right lower leg: Secondary | ICD-10-CM | POA: Diagnosis not present

## 2016-01-27 DIAGNOSIS — M23300 Other meniscus derangements, unspecified lateral meniscus, right knee: Secondary | ICD-10-CM | POA: Diagnosis not present

## 2016-02-02 DIAGNOSIS — Z9889 Other specified postprocedural states: Secondary | ICD-10-CM | POA: Diagnosis not present

## 2016-02-02 DIAGNOSIS — M1711 Unilateral primary osteoarthritis, right knee: Secondary | ICD-10-CM | POA: Diagnosis not present

## 2016-02-06 DIAGNOSIS — B379 Candidiasis, unspecified: Secondary | ICD-10-CM | POA: Diagnosis not present

## 2016-02-06 DIAGNOSIS — E1165 Type 2 diabetes mellitus with hyperglycemia: Secondary | ICD-10-CM | POA: Diagnosis not present

## 2016-02-06 DIAGNOSIS — R110 Nausea: Secondary | ICD-10-CM | POA: Diagnosis not present

## 2016-02-14 DIAGNOSIS — B379 Candidiasis, unspecified: Secondary | ICD-10-CM | POA: Diagnosis not present

## 2016-02-14 DIAGNOSIS — E1165 Type 2 diabetes mellitus with hyperglycemia: Secondary | ICD-10-CM | POA: Diagnosis not present

## 2016-02-16 DIAGNOSIS — M1711 Unilateral primary osteoarthritis, right knee: Secondary | ICD-10-CM | POA: Diagnosis not present

## 2016-02-16 DIAGNOSIS — Z9889 Other specified postprocedural states: Secondary | ICD-10-CM | POA: Diagnosis not present

## 2016-02-16 DIAGNOSIS — Z4789 Encounter for other orthopedic aftercare: Secondary | ICD-10-CM | POA: Diagnosis not present

## 2016-02-22 DIAGNOSIS — M1711 Unilateral primary osteoarthritis, right knee: Secondary | ICD-10-CM | POA: Diagnosis not present

## 2016-02-22 DIAGNOSIS — M25561 Pain in right knee: Secondary | ICD-10-CM | POA: Diagnosis not present

## 2016-02-27 DIAGNOSIS — M1711 Unilateral primary osteoarthritis, right knee: Secondary | ICD-10-CM | POA: Diagnosis not present

## 2016-02-27 DIAGNOSIS — M25561 Pain in right knee: Secondary | ICD-10-CM | POA: Diagnosis not present

## 2016-02-29 DIAGNOSIS — M25561 Pain in right knee: Secondary | ICD-10-CM | POA: Diagnosis not present

## 2016-02-29 DIAGNOSIS — M1711 Unilateral primary osteoarthritis, right knee: Secondary | ICD-10-CM | POA: Diagnosis not present

## 2016-03-02 DIAGNOSIS — M1711 Unilateral primary osteoarthritis, right knee: Secondary | ICD-10-CM | POA: Diagnosis not present

## 2016-03-02 DIAGNOSIS — M25561 Pain in right knee: Secondary | ICD-10-CM | POA: Diagnosis not present

## 2016-03-05 DIAGNOSIS — E7800 Pure hypercholesterolemia, unspecified: Secondary | ICD-10-CM | POA: Diagnosis not present

## 2016-03-05 DIAGNOSIS — M25561 Pain in right knee: Secondary | ICD-10-CM | POA: Diagnosis not present

## 2016-03-05 DIAGNOSIS — M1711 Unilateral primary osteoarthritis, right knee: Secondary | ICD-10-CM | POA: Diagnosis not present

## 2016-03-05 DIAGNOSIS — R252 Cramp and spasm: Secondary | ICD-10-CM | POA: Diagnosis not present

## 2016-03-05 DIAGNOSIS — E1165 Type 2 diabetes mellitus with hyperglycemia: Secondary | ICD-10-CM | POA: Diagnosis not present

## 2016-03-07 DIAGNOSIS — M25561 Pain in right knee: Secondary | ICD-10-CM | POA: Diagnosis not present

## 2016-03-07 DIAGNOSIS — M1711 Unilateral primary osteoarthritis, right knee: Secondary | ICD-10-CM | POA: Diagnosis not present

## 2016-03-09 DIAGNOSIS — M25561 Pain in right knee: Secondary | ICD-10-CM | POA: Diagnosis not present

## 2016-03-09 DIAGNOSIS — M1711 Unilateral primary osteoarthritis, right knee: Secondary | ICD-10-CM | POA: Diagnosis not present

## 2016-03-12 DIAGNOSIS — M25561 Pain in right knee: Secondary | ICD-10-CM | POA: Diagnosis not present

## 2016-03-12 DIAGNOSIS — M1711 Unilateral primary osteoarthritis, right knee: Secondary | ICD-10-CM | POA: Diagnosis not present

## 2016-03-14 DIAGNOSIS — M1711 Unilateral primary osteoarthritis, right knee: Secondary | ICD-10-CM | POA: Diagnosis not present

## 2016-03-14 DIAGNOSIS — M25561 Pain in right knee: Secondary | ICD-10-CM | POA: Diagnosis not present

## 2016-03-15 DIAGNOSIS — J209 Acute bronchitis, unspecified: Secondary | ICD-10-CM | POA: Diagnosis not present

## 2016-03-29 DIAGNOSIS — Z4789 Encounter for other orthopedic aftercare: Secondary | ICD-10-CM | POA: Diagnosis not present

## 2016-04-03 DIAGNOSIS — Z23 Encounter for immunization: Secondary | ICD-10-CM | POA: Diagnosis not present

## 2016-04-19 DIAGNOSIS — Z4789 Encounter for other orthopedic aftercare: Secondary | ICD-10-CM | POA: Diagnosis not present

## 2016-04-23 DIAGNOSIS — Z299 Encounter for prophylactic measures, unspecified: Secondary | ICD-10-CM | POA: Diagnosis not present

## 2016-04-23 DIAGNOSIS — M25561 Pain in right knee: Secondary | ICD-10-CM | POA: Diagnosis not present

## 2016-04-23 DIAGNOSIS — J45909 Unspecified asthma, uncomplicated: Secondary | ICD-10-CM | POA: Diagnosis not present

## 2016-04-23 DIAGNOSIS — Z1231 Encounter for screening mammogram for malignant neoplasm of breast: Secondary | ICD-10-CM | POA: Diagnosis not present

## 2016-04-23 DIAGNOSIS — K219 Gastro-esophageal reflux disease without esophagitis: Secondary | ICD-10-CM | POA: Diagnosis not present

## 2016-05-04 ENCOUNTER — Encounter (INDEPENDENT_AMBULATORY_CARE_PROVIDER_SITE_OTHER): Payer: Self-pay | Admitting: *Deleted

## 2016-05-04 ENCOUNTER — Encounter (INDEPENDENT_AMBULATORY_CARE_PROVIDER_SITE_OTHER): Payer: Self-pay | Admitting: Internal Medicine

## 2016-05-04 ENCOUNTER — Encounter (INDEPENDENT_AMBULATORY_CARE_PROVIDER_SITE_OTHER): Payer: Self-pay

## 2016-05-04 ENCOUNTER — Ambulatory Visit (INDEPENDENT_AMBULATORY_CARE_PROVIDER_SITE_OTHER): Payer: Medicare Other | Admitting: Internal Medicine

## 2016-05-04 VITALS — BP 98/60 | HR 60 | Temp 97.4°F | Ht 63.0 in | Wt 142.2 lb

## 2016-05-04 DIAGNOSIS — R1310 Dysphagia, unspecified: Secondary | ICD-10-CM | POA: Diagnosis not present

## 2016-05-04 DIAGNOSIS — R1319 Other dysphagia: Secondary | ICD-10-CM

## 2016-05-04 NOTE — Progress Notes (Signed)
Subjective:    Patient ID: Katrina Castro, female    DOB: September 12, 1945, 70 y.o.   MRN: [id number]  HPI Presents today with c/o dysphagia.  She says symptoms started for a year. Underwent an EGD in January with disruption of Schatzki's. Foods are lodging in her esophagus. She says she strangled once trying to get the bolus up. She says she was on Prednisone 2 weeks ago. She was on a dose pack.  She says the Prednisone "messed up" her taste buds. She is having a BM daily. No melena or BRRB.  GERD controlled for the most part with Protonix.  Underwent an Esophageal manometry in February of this year which revealed hypertensive LES with incomplete relaxation and strong peristaltic contraction of esophageal body. Consistent with esophageal outflow obstruction. She was intolerant of Cardizem.   07/06/2015 EGD/ED. Dysphagia. Dr. Laural Golden Impression: Schatzki's ring and a small sliding hiatal hernia. No evidence of erosive esophagitis or peptic ulcer disease. Schatzki's ring disrupted by passing 56 [place] dilator.       Review of Systems Past Medical History:  Diagnosis Date  . Diabetes ([place])   . Dysphagia   . GERD (gastroesophageal reflux disease)   . Headache   . History of bronchitis   . Seasonal allergies   . Toenail fungus     Past Surgical History:  Procedure Laterality Date  . back surgeries x 4    . COLONOSCOPY N/A 11/05/2012   Procedure: COLONOSCOPY;  Surgeon: Rogene Houston, MD;  Location: [facility];  Service: Endoscopy;  Laterality: N/A;  1030  . ESOPHAGEAL DILATION N/A 07/06/2015   Procedure: ESOPHAGEAL DILATION;  Surgeon: Rogene Houston, MD;  Location: [facility];  Service: Endoscopy;  Laterality: N/A;  . ESOPHAGOGASTRODUODENOSCOPY N/A 02/12/2014   Procedure: ESOPHAGOGASTRODUODENOSCOPY (EGD);  Surgeon: Rogene Houston, MD;  Location: [facility];  Service: Endoscopy;  Laterality: N/A;  150  . ESOPHAGOGASTRODUODENOSCOPY N/A 07/06/2015   Procedure:  ESOPHAGOGASTRODUODENOSCOPY (EGD);  Surgeon: Rogene Houston, MD;  Location: [facility];  Service: Endoscopy;  Laterality: N/A;  1:25 - moved to 1/18 @ 10:30 - Ann to notify pt  . ESOPHAGOGASTRODUODENOSCOPY (EGD) WITH ESOPHAGEAL DILATION N/A 07/25/2012   Procedure: ESOPHAGOGASTRODUODENOSCOPY (EGD) WITH ESOPHAGEAL DILATION;  Surgeon: Rogene Houston, MD;  Location: [facility];  Service: Endoscopy;  Laterality: N/A;  325-rescheduled to [place] notified pt  . Foot surgeries    . MALONEY DILATION N/A 02/12/2014   Procedure: Venia Minks DILATION;  Surgeon: Rogene Houston, MD;  Location: [facility];  Service: Endoscopy;  Laterality: N/A;  . Right knee arthroscopy    . TOTAL ABDOMINAL HYSTERECTOMY      Allergies  Allergen Reactions  . Cardizem [Diltiazem] Other (See Comments)    Patient states that she could not think, felt that she was in the twilight zone. Chest discomfort.  . Other     Antibiotic that started with an "O"-caused anaphylaxis  . Sulfa Antibiotics Itching    Current Outpatient Prescriptions on File Prior to Visit  Medication Sig Dispense Refill  . aspirin 81 MG tablet Take 81 mg by mouth every other day.     . cetirizine (ZYRTEC) 10 MG tablet Take 10 mg by mouth daily.    . cyclobenzaprine (FLEXERIL) 10 MG tablet Take 10 mg by mouth 3 (three) times daily as needed for muscle spasms.    Marland Kitchen imipramine (TOFRANIL) 25 MG tablet Take 1 tablet (25 mg total) by mouth at bedtime.  30 tablet 5  . metFORMIN (GLUCOPHAGE) 500 MG tablet Take 500 mg by mouth 2 (two) times daily with a meal.    . pantoprazole (PROTONIX) 40 MG tablet Take 40 mg by mouth daily.     No current facility-administered medications on file prior to visit.        Objective:   Physical Exam Blood pressure 98/60, pulse 60, temperature 97.4 F (36.3 C), height 5\' 3"  (1.6 m), weight 142 lb 3.2 oz (64.5 kg).   Alert and oriented. Skin warm and dry. Oral mucosa is moist.   . Sclera anicteric, conjunctivae is pink.  Thyroid not enlarged. No cervical lymphadenopathy. Lungs clear. Heart regular rate and rhythm.  Abdomen is soft. Bowel sounds are positive. No hepatomegaly. No abdominal masses felt. No tenderness.  No edema to lower extremities.       Assessment & Plan:  Dysphagia.  Am going to schedule an DG esophagram.  Hx of Schatzki's ring.

## 2016-05-04 NOTE — Patient Instructions (Addendum)
DG esophagram.   

## 2016-05-07 ENCOUNTER — Ambulatory Visit (HOSPITAL_COMMUNITY)
Admission: RE | Admit: 2016-05-07 | Discharge: 2016-05-07 | Disposition: A | Discharge status: Home / Self Care | Payer: Medicare Other | Source: Ambulatory Visit | Attending: Internal Medicine | Admitting: Internal Medicine

## 2016-05-07 DIAGNOSIS — R1319 Other dysphagia: Secondary | ICD-10-CM

## 2016-05-07 DIAGNOSIS — K224 Dyskinesia of esophagus: Secondary | ICD-10-CM | POA: Diagnosis not present

## 2016-05-07 DIAGNOSIS — R1310 Dysphagia, unspecified: Secondary | ICD-10-CM | POA: Insufficient documentation

## 2016-05-08 ENCOUNTER — Telehealth (INDEPENDENT_AMBULATORY_CARE_PROVIDER_SITE_OTHER): Payer: Self-pay | Admitting: Internal Medicine

## 2016-05-08 NOTE — Telephone Encounter (Signed)
Patient called, would know to know her results.  [phone]

## 2016-05-08 NOTE — Telephone Encounter (Signed)
Results given to patient.  Will talk with Dr. Laural Golden

## 2016-05-09 ENCOUNTER — Telehealth (INDEPENDENT_AMBULATORY_CARE_PROVIDER_SITE_OTHER): Payer: Self-pay | Admitting: Internal Medicine

## 2016-05-09 NOTE — Telephone Encounter (Signed)
ok 

## 2016-05-09 NOTE — Telephone Encounter (Signed)
EGD/ED. Botox.  With propofol

## 2016-05-09 NOTE — Telephone Encounter (Signed)
Patient called, stated that Katrina Castro was going to talk to Dr. Laural Golden and then call her back.  She wanted to be sure that the return call is made to her cell phone.  [phone]

## 2016-05-09 NOTE — Telephone Encounter (Signed)
Message left on answering machine concerning EGD/ED Botox under propofol.

## 2016-05-14 ENCOUNTER — Other Ambulatory Visit (INDEPENDENT_AMBULATORY_CARE_PROVIDER_SITE_OTHER): Payer: Self-pay | Admitting: Internal Medicine

## 2016-05-14 DIAGNOSIS — R1310 Dysphagia, unspecified: Secondary | ICD-10-CM

## 2016-05-14 DIAGNOSIS — K220 Achalasia of cardia: Secondary | ICD-10-CM

## 2016-05-15 DIAGNOSIS — K220 Achalasia of cardia: Secondary | ICD-10-CM | POA: Insufficient documentation

## 2016-05-15 NOTE — Patient Instructions (Signed)
Katrina Castro  05/15/2016     @PREFPERIOPPHARMACY @   Your procedure is scheduled on   05/18/2016   Report to [facility] at  615   A.M.  Call this number if you have problems the morning of surgery:  [phone]   Remember:  Do not eat food or drink liquids after midnight.  Take these medicines the morning of surgery with A SIP OF WATER  Zyrtec, flexaril, protonix.   Do not wear jewelry, make-up or nail polish.  Do not wear lotions, powders, or perfumes, or deoderant.  Do not shave 48 hours prior to surgery.  Men may shave face and neck.  Do not bring valuables to the hospital.  [facility] is not responsible for any belongings or valuables.  Contacts, dentures or bridgework may not be worn into surgery.  Leave your suitcase in the car.  After surgery it may be brought to your room.  For patients admitted to the hospital, discharge time will be determined by your treatment team.  Patients discharged the day of surgery will not be allowed to drive home.   Name and phone number of your driver:   family Special instructions:  Follow the diet instructions given to you by Dr Olevia Perches office.  Please read over the following fact sheets that you were given. Anesthesia Post-op Instructions and Care and Recovery After Surgery       Botulinum Toxin Cosmetic Injection Introduction Botulinum toxin injection is a procedure to soften facial lines and wrinkles. Botulinum toxin is produced by a bacterium called Clostridium botulinum. The toxin is injected into muscles with a very thin needle. The toxin works by blocking nerve impulses to specific muscles. This weakens and temporarily paralyzes those muscles, which reduces the lines of facial expression. Tell a health care provider about:  Any allergies you have, especially allergies to eggs.  All medicines you are taking, including antibiotics, vitamins, herbs, eye drops, creams, and over-the-counter  medicines.  Any problems you or family members have had with anesthetic medicines.  Any blood disorders you have.  Any surgeries you have had.  Any medical conditions you have, including neurologicaldisorders, hyperthyroidism, lung disease, or heart disease.  Whether you are pregnant, may be pregnant, or are breastfeeding. What are the risks? Generally, this is a safe procedure. However, problems may occur. Common problems include pain, swelling, and bruising at the injection sites. Other problems include:  Pain in the neck or jaw.  Infection.  Allergic reaction to the injection.  Damage to nearby structures or organs.  Drooping of the eyebrow or eyelid.  Double or blurred vision.  Changes in voice or speech.  Losing the ability to close one or both eyes.  Trouble swallowing. What happens before the procedure?  Do not drink any alcohol as told by your health care provider.  Ask your health care provider about:  Changing or stopping your regular medicines. This is especially important if you are taking diabetes medicines or blood thinners.  Taking medicines such as aspirin and ibuprofen. These medicines can thin your blood. Do not take these medicines before your procedure if your health care provider instructs you not to.  Plan to have someone take you home after the procedure, if told by your health care provider. What happens during the procedure?  You may be given a medicine to numb the area (local anesthetic).  Your health care provider will  inject small amounts of the toxin into specific muscles. The number of injections that you get will depend on your specific condition and the area that is being treated. What happens after the procedure?  Follow up with your health care provider as directed. This information is not intended to replace advice given to you by your health care provider. Make sure you discuss any questions you have with your health care  provider. Document Released: 03/01/2004 Document Revised: 11/10/2015 Document Reviewed: 12/08/2014  2017 [employer]  Botulinum Toxin Bladder Injection, Care After Introduction Refer to this sheet in the next few weeks. These instructions provide you with information about caring for yourself after your procedure. Your health care provider may also give you more specific instructions. Your treatment has been planned according to current medical practices, but problems sometimes occur. Call your health care provider if you have any problems or questions after your procedure. What can I expect after the procedure? After the procedure, it is common to have:  Blood-tinged urine.  Burning or soreness when you pass urine. Follow these instructions at home:   Do not drive for 24 hours if you received a sedative.  Take over-the-counter and prescription medicines only as told by your health care provider.  If you were prescribed an antibiotic medicine, take it as told by your health care provider. Do not stop taking the antibiotic even if you start to feel better.  Drink enough fluid to keep your urine clear or pale yellow.  Return to your normal activities as told by your health care provider. Ask your health care provider what activities are safe for you.  Keep all follow-up visits as told by your health care provider. This is important. Contact a health care provider if:  You have a fever or chills.  You have blood-tinged urine for more than one day after your procedure.  You have worsening pain or burning when you pass urine.  You have pain or burning when passing urine for more than two days after your procedure.  You have trouble emptying your bladder. Get help right away if:  You have bright red blood in your urine.  You are unable to pass urine. This information is not intended to replace advice given to you by your health care provider. Make sure you discuss any questions you  have with your health care provider. Document Released: 02/23/2015 Document Revised: 12/23/2015 Document Reviewed: 12/01/2014  2017 [employer]  Esophagogastroduodenoscopy Introduction Esophagogastroduodenoscopy (EGD) is a procedure to examine the lining of the esophagus, stomach, and first part of the small intestine (duodenum). This procedure is done to check for problems such as inflammation, bleeding, ulcers, or growths. During this procedure, a long, flexible, lighted tube with a camera attached (endoscope) is inserted down the throat. Tell a health care provider about:  Any allergies you have.  All medicines you are taking, including vitamins, herbs, eye drops, creams, and over-the-counter medicines.  Any problems you or family members have had with anesthetic medicines.  Any blood disorders you have.  Any surgeries you have had.  Any medical conditions you have.  Whether you are pregnant or may be pregnant. What are the risks? Generally, this is a safe procedure. However, problems may occur, including:  Infection.  Bleeding.  A tear (perforation) in the esophagus, stomach, or duodenum.  Trouble breathing.  Excessive sweating.  Spasms of the larynx.  A slowed heartbeat.  Low blood pressure. What happens before the procedure?  Follow instructions from your health  care provider about eating or drinking restrictions.  Ask your health care provider about:  Changing or stopping your regular medicines. This is especially important if you are taking diabetes medicines or blood thinners.  Taking medicines such as aspirin and ibuprofen. These medicines can thin your blood. Do not take these medicines before your procedure if your health care provider instructs you not to.  Plan to have someone take you home after the procedure.  If you wear dentures, be ready to remove them before the procedure. What happens during the procedure?  To reduce your risk of infection,  your health care team will wash or sanitize their hands.  An IV tube will be put in a vein in your hand or arm. You will get medicines and fluids through this tube.  You will be given one or more of the following:  A medicine to help you relax (sedative).  A medicine to numb the area (local anesthetic). This medicine may be sprayed into your throat. It will make you feel more comfortable and keep you from gagging or coughing during the procedure.  A medicine for pain.  A mouth guard may be placed in your mouth to protect your teeth and to keep you from biting on the endoscope.  You will be asked to lie on your left side.  The endoscope will be lowered down your throat into your esophagus, stomach, and duodenum.  Air will be put into the endoscope. This will help your health care provider see better.  The lining of your esophagus, stomach, and duodenum will be examined.  Your health care provider may:  Take a tissue sample so it can be looked at in a lab (biopsy).  Remove growths.  Remove objects (foreign bodies) that are stuck.  Treat any bleeding with medicines or other devices that stop tissue from bleeding.  Widen (dilate) or stretch narrowed areas of your esophagus and stomach.  The endoscope will be taken out. The procedure may vary among health care providers and hospitals. What happens after the procedure?  Your blood pressure, heart rate, breathing rate, and blood oxygen level will be monitored often until the medicines you were given have worn off.  Do not eat or drink anything until the numbing medicine has worn off and your gag reflex has returned. This information is not intended to replace advice given to you by your health care provider. Make sure you discuss any questions you have with your health care provider. Document Released: 10/05/2004 Document Revised: 11/10/2015 Document Reviewed: 04/28/2015  2017 Elsevier Esophagogastroduodenoscopy, Care  After Introduction Refer to this sheet in the next few weeks. These instructions provide you with information about caring for yourself after your procedure. Your health care provider may also give you more specific instructions. Your treatment has been planned according to current medical practices, but problems sometimes occur. Call your health care provider if you have any problems or questions after your procedure. What can I expect after the procedure? After the procedure, it is common to have:  A sore throat.  Nausea.  Bloating.  Dizziness.  Fatigue. Follow these instructions at home:  Do not eat or drink anything until the numbing medicine (local anesthetic) has worn off and your gag reflex has returned. You will know that the local anesthetic has worn off when you can swallow comfortably.  Do not drive for 24 hours if you received a medicine to help you relax (sedative).  If your health care provider took a tissue  sample for testing during the procedure, make sure to get your test results. This is your responsibility. Ask your health care provider or the department performing the test when your results will be ready.  Keep all follow-up visits as told by your health care provider. This is important. Contact a health care provider if:  You cannot stop coughing.  You are not urinating.  You are urinating less than usual. Get help right away if:  You have trouble swallowing.  You cannot eat or drink.  You have throat or chest pain that gets worse.  You are dizzy or light-headed.  You faint.  You have nausea or vomiting.  You have chills.  You have a fever.  You have severe abdominal pain.  You have black, tarry, or bloody stools. This information is not intended to replace advice given to you by your health care provider. Make sure you discuss any questions you have with your health care provider. Document Released: 05/21/2012 Document Revised: 11/10/2015  Document Reviewed: 04/28/2015  2017 Elsevier  Esophageal Dilatation Esophageal dilatation is a procedure to open a blocked or narrowed part of the esophagus. The esophagus is the long tube in your throat that carries food and liquid from your mouth to your stomach. The procedure is also called esophageal dilation. You may need this procedure if you have a buildup of scar tissue in your esophagus that makes it difficult, painful, or even impossible to swallow. This can be caused by gastroesophageal reflux disease (GERD). In rare cases, people need this procedure because they have cancer of the esophagus or a problem with the way food moves through the esophagus. Sometimes you may need to have another dilatation to enlarge the opening of the esophagus gradually. Tell a health care provider about:  Any allergies you have.  All medicines you are taking, including vitamins, herbs, eye drops, creams, and over-the-counter medicines.  Any problems you or family members have had with anesthetic medicines.  Any blood disorders you have.  Any surgeries you have had.  Any medical conditions you have.  Any antibiotic medicines you are required to take before dental procedures. What are the risks? Generally, this is a safe procedure. However, problems can occur and include:  Bleeding from a tear in the lining of the esophagus.  A hole (perforation) in the esophagus. What happens before the procedure?  Do not eat or drink anything after midnight on the night before the procedure or as directed by your health care provider.  Ask your health care provider about changing or stopping your regular medicines. This is especially important if you are taking diabetes medicines or blood thinners.  Plan to have someone take you home after the procedure. What happens during the procedure?  You will be given a medicine that makes you relaxed and sleepy (sedative).  A medicine may be sprayed or gargled to  numb the back of the throat.  Your health care provider can use various instruments to do an esophageal dilatation. During the procedure, the instrument used will be placed in your mouth and passed down into your esophagus. Options include:  Simple dilators. This instrument is carefully placed in the esophagus to stretch it.  Guided wire bougies. In this method, a flexible tube (endoscope) is used to insert a wire into the esophagus. The dilator is passed over this wire to enlarge the esophagus. Then the wire is removed.  Balloon dilators. An endoscope with a small balloon at the end is passed down  into the esophagus. Inflating the balloon gently stretches the esophagus and opens it up. What happens after the procedure?  Your blood pressure, heart rate, breathing rate, and blood oxygen level will be monitored often until the medicines you were given have worn off.  Your throat may feel slightly sore and will probably still feel numb. This will improve slowly over time.  You will not be allowed to eat or drink until the throat numbness has resolved.  If this is a same-day procedure, you may be allowed to go home once you have been able to drink, urinate, and sit on the edge of the bed without nausea or dizziness.  If this is a same-day procedure, you should have a friend or family member with you for the next 24 hours after the procedure. This information is not intended to replace advice given to you by your health care provider. Make sure you discuss any questions you have with your health care provider. Document Released: 07/26/2005 Document Revised: 11/10/2015 Document Reviewed: 10/14/2013 Elsevier Interactive Patient Education  2017 [place] Anesthesia is a term that refers to techniques, procedures, and medicines that help a person stay safe and comfortable during a medical procedure. Monitored anesthesia care, or sedation, is one type of anesthesia.  Your anesthesia specialist may recommend sedation if you will be having a procedure that does not require you to be unconscious, such as:  Cataract surgery.  A dental procedure.  A biopsy.  A colonoscopy. During the procedure, you may receive a medicine to help you relax (sedative). There are three levels of sedation:  Mild sedation. At this level, you may feel awake and relaxed. You will be able to follow directions.  Moderate sedation. At this level, you will be sleepy. You may not remember the procedure.  Deep sedation. At this level, you will be asleep. You will not remember the procedure. The more medicine you are given, the deeper your level of sedation will be. Depending on how you respond to the procedure, the anesthesia specialist may change your level of sedation or the type of anesthesia to fit your needs. An anesthesia specialist will monitor you closely during the procedure. Let your health care provider know about:  Any allergies you have.  All medicines you are taking, including vitamins, herbs, eye drops, creams, and over-the-counter medicines.  Any use of steroids (by mouth or as a cream).  Any problems you or family members have had with sedatives and anesthetic medicines.  Any blood disorders you have.  Any surgeries you have had.  Any medical conditions you have, such as sleep apnea.  Whether you are pregnant or may be pregnant.  Any use of cigarettes, alcohol, or street drugs. What are the risks? Generally, this is a safe procedure. However, problems may occur, including:  Getting too much medicine (oversedation).  Nausea.  Allergic reaction to medicines.  Trouble breathing. If this happens, a breathing tube may be used to help with breathing. It will be removed when you are awake and breathing on your own.  Heart trouble.  Lung trouble. Before the procedure Staying hydrated  Follow instructions from your health care provider about hydration,  which may include:  Up to 2 hours before the procedure - you may continue to drink clear liquids, such as water, clear fruit juice, black coffee, and plain tea. Eating and drinking restrictions  Follow instructions from your health care provider about eating and drinking, which may include:  8  hours before the procedure - stop eating heavy meals or foods such as meat, fried foods, or fatty foods.  6 hours before the procedure - stop eating light meals or foods, such as toast or cereal.  6 hours before the procedure - stop drinking milk or drinks that contain milk.  2 hours before the procedure - stop drinking clear liquids. Medicines  Ask your health care provider about:  Changing or stopping your regular medicines. This is especially important if you are taking diabetes medicines or blood thinners.  Taking medicines such as aspirin and ibuprofen. These medicines can thin your blood. Do not take these medicines before your procedure if your health care provider instructs you not to. Tests and exams  You will have a physical exam.  You may have blood tests done to show:  How well your kidneys and liver are working.  How well your blood can clot.  General instructions  Plan to have someone take you home from the hospital or clinic.  If you will be going home right after the procedure, plan to have someone with you for 24 hours. What happens during the procedure?  Your blood pressure, heart rate, breathing, level of pain and overall condition will be monitored.  An IV tube will be inserted into one of your veins.  Your anesthesia specialist will give you medicines as needed to keep you comfortable during the procedure. This may mean changing the level of sedation.  The procedure will be performed. After the procedure  Your blood pressure, heart rate, breathing rate, and blood oxygen level will be monitored until the medicines you were given have worn off.  Do not drive for  24 hours if you received a sedative.  You may:  Feel sleepy, clumsy, or nauseous.  Feel forgetful about what happened after the procedure.  Have a sore throat if you had a breathing tube during the procedure.  Vomit. This information is not intended to replace advice given to you by your health care provider. Make sure you discuss any questions you have with your health care provider. Document Released: 02/28/2005 Document Revised: 11/11/2015 Document Reviewed: 09/25/2015 Elsevier Interactive Patient Education  2017 [place] POST-ANESTHESIA  IMMEDIATELY FOLLOWING SURGERY:  Do not drive or operate machinery for the first twenty four hours after surgery.  Do not make any important decisions for twenty four hours after surgery or while taking narcotic pain medications or sedatives.  If you develop intractable nausea and vomiting or a severe headache please notify your doctor immediately.  FOLLOW-UP:  Please make an appointment with your surgeon as instructed. You do not need to follow up with anesthesia unless specifically instructed to do so.  WOUND CARE INSTRUCTIONS (if applicable):  Keep a dry clean dressing on the anesthesia/puncture wound site if there is drainage.  Once the wound has quit draining you may leave it open to air.  Generally you should leave the bandage intact for twenty four hours unless there is drainage.  If the epidural site drains for more than 36-48 hours please call the anesthesia department.  QUESTIONS?:  Please feel free to call your physician or the hospital operator if you have any questions, and they will be happy to assist you.

## 2016-05-15 NOTE — Telephone Encounter (Signed)
EGD/ED w botox & propofol sch'd 05/18/16 at 730; preop 11/29 @ 11, patient aware, verbal instructions given

## 2016-05-16 ENCOUNTER — Encounter (HOSPITAL_COMMUNITY): Payer: Self-pay

## 2016-05-16 ENCOUNTER — Other Ambulatory Visit: Payer: Self-pay

## 2016-05-16 ENCOUNTER — Encounter (HOSPITAL_COMMUNITY)
Admission: RE | Admit: 2016-05-16 | Discharge: 2016-05-16 | Disposition: A | Discharge status: Home / Self Care | Payer: Medicare Other | Source: Ambulatory Visit | Attending: Internal Medicine | Admitting: Internal Medicine

## 2016-05-16 DIAGNOSIS — E876 Hypokalemia: Secondary | ICD-10-CM | POA: Diagnosis not present

## 2016-05-16 DIAGNOSIS — I4891 Unspecified atrial fibrillation: Secondary | ICD-10-CM | POA: Diagnosis not present

## 2016-05-16 DIAGNOSIS — R1310 Dysphagia, unspecified: Secondary | ICD-10-CM | POA: Insufficient documentation

## 2016-05-16 DIAGNOSIS — Z01810 Encounter for preprocedural cardiovascular examination: Secondary | ICD-10-CM

## 2016-05-16 DIAGNOSIS — E119 Type 2 diabetes mellitus without complications: Secondary | ICD-10-CM

## 2016-05-16 DIAGNOSIS — Z5309 Procedure and treatment not carried out because of other contraindication: Secondary | ICD-10-CM | POA: Diagnosis not present

## 2016-05-16 DIAGNOSIS — G43009 Migraine without aura, not intractable, without status migrainosus: Secondary | ICD-10-CM

## 2016-05-16 DIAGNOSIS — Z01812 Encounter for preprocedural laboratory examination: Secondary | ICD-10-CM | POA: Insufficient documentation

## 2016-05-16 DIAGNOSIS — K220 Achalasia of cardia: Secondary | ICD-10-CM | POA: Diagnosis not present

## 2016-05-16 DIAGNOSIS — Z79899 Other long term (current) drug therapy: Secondary | ICD-10-CM | POA: Diagnosis not present

## 2016-05-16 DIAGNOSIS — Z7984 Long term (current) use of oral hypoglycemic drugs: Secondary | ICD-10-CM | POA: Diagnosis not present

## 2016-05-16 DIAGNOSIS — K219 Gastro-esophageal reflux disease without esophagitis: Secondary | ICD-10-CM

## 2016-05-16 HISTORY — DX: Unspecified osteoarthritis, unspecified site: M19.90

## 2016-05-16 LAB — BASIC METABOLIC PANEL
Anion gap: 9 (ref 5–15)
BUN: 11 mg/dL (ref 6–20)
CO2: 25 mmol/L (ref 22–32)
Calcium: 9.7 mg/dL (ref 8.9–10.3)
Chloride: 106 mmol/L (ref 101–111)
Creatinine, Ser: 0.64 mg/dL (ref 0.44–1.00)
GFR calc Af Amer: >60 mL/min (ref >60)
GFR calc non Af Amer: >60 mL/min (ref >60)
Glucose, Bld: 139 mg/dL — ABNORMAL HIGH (ref 65–99)
Potassium: 3.3 mmol/L — ABNORMAL LOW (ref 3.5–5.1)
Sodium: 140 mmol/L (ref 135–145)

## 2016-05-16 LAB — CBC WITH DIFFERENTIAL/PLATELET
Basophils Absolute: 0 10*3/uL (ref 0.0–0.1)
Basophils Relative: 0 %
Eosinophils Absolute: 0.1 10*3/uL (ref 0.0–0.7)
Eosinophils Relative: 1 %
HCT: 39.5 % (ref 36.0–46.0)
Hemoglobin: 13.3 g/dL (ref 12.0–15.0)
Lymphocytes Relative: 43 %
Lymphs Abs: 3.5 10*3/uL (ref 0.7–4.0)
MCH: 29.4 pg (ref 26.0–34.0)
MCHC: 33.7 g/dL (ref 30.0–36.0)
MCV: 87.2 fL (ref 78.0–100.0)
Monocytes Absolute: 0.5 10*3/uL (ref 0.1–1.0)
Monocytes Relative: 6 %
Neutro Abs: 4.1 10*3/uL (ref 1.7–7.7)
Neutrophils Relative %: 50 %
Platelets: 309 10*3/uL (ref 150–400)
RBC: 4.53 MIL/uL (ref 3.87–5.11)
RDW: 13.2 % (ref 11.5–15.5)
WBC: 8.2 10*3/uL (ref 4.0–10.5)

## 2016-05-16 NOTE — Pre-Procedure Instructions (Signed)
Potassium of 3.3 reported to Dr Patsey Berthold and no orders given.

## 2016-05-18 ENCOUNTER — Observation Stay (HOSPITAL_BASED_OUTPATIENT_CLINIC_OR_DEPARTMENT_OTHER): Payer: Medicare Other

## 2016-05-18 ENCOUNTER — Encounter (HOSPITAL_COMMUNITY)
Admission: RE | Disposition: A | Discharge status: Home / Self Care | Payer: Self-pay | Source: Ambulatory Visit | Attending: Emergency Medicine

## 2016-05-18 ENCOUNTER — Observation Stay (HOSPITAL_COMMUNITY)
Admission: RE | Admit: 2016-05-18 | Discharge: 2016-05-19 | Disposition: A | Discharge status: Home / Self Care | Payer: Medicare Other | Source: Ambulatory Visit | Attending: Internal Medicine | Admitting: Internal Medicine

## 2016-05-18 ENCOUNTER — Ambulatory Visit (HOSPITAL_COMMUNITY): Payer: Medicare Other

## 2016-05-18 ENCOUNTER — Encounter (HOSPITAL_COMMUNITY): Payer: Self-pay | Admitting: Cardiology

## 2016-05-18 DIAGNOSIS — R002 Palpitations: Secondary | ICD-10-CM | POA: Diagnosis not present

## 2016-05-18 DIAGNOSIS — E118 Type 2 diabetes mellitus with unspecified complications: Secondary | ICD-10-CM

## 2016-05-18 DIAGNOSIS — E876 Hypokalemia: Secondary | ICD-10-CM | POA: Diagnosis present

## 2016-05-18 DIAGNOSIS — K220 Achalasia of cardia: Principal | ICD-10-CM | POA: Insufficient documentation

## 2016-05-18 DIAGNOSIS — E119 Type 2 diabetes mellitus without complications: Secondary | ICD-10-CM | POA: Diagnosis not present

## 2016-05-18 DIAGNOSIS — Z7984 Long term (current) use of oral hypoglycemic drugs: Secondary | ICD-10-CM | POA: Insufficient documentation

## 2016-05-18 DIAGNOSIS — K219 Gastro-esophageal reflux disease without esophagitis: Secondary | ICD-10-CM | POA: Diagnosis present

## 2016-05-18 DIAGNOSIS — Z79899 Other long term (current) drug therapy: Secondary | ICD-10-CM | POA: Insufficient documentation

## 2016-05-18 DIAGNOSIS — Z5309 Procedure and treatment not carried out because of other contraindication: Secondary | ICD-10-CM | POA: Diagnosis not present

## 2016-05-18 DIAGNOSIS — I4891 Unspecified atrial fibrillation: Secondary | ICD-10-CM

## 2016-05-18 DIAGNOSIS — R1310 Dysphagia, unspecified: Secondary | ICD-10-CM

## 2016-05-18 LAB — COMPREHENSIVE METABOLIC PANEL
ALT: 23 U/L (ref 14–54)
AST: 24 U/L (ref 15–41)
Albumin: 4 g/dL (ref 3.5–5.0)
Alkaline Phosphatase: 58 U/L (ref 38–126)
Anion gap: 6 (ref 5–15)
BUN: 9 mg/dL (ref 6–20)
CO2: 26 mmol/L (ref 22–32)
Calcium: 9.5 mg/dL (ref 8.9–10.3)
Chloride: 107 mmol/L (ref 101–111)
Creatinine, Ser: 0.69 mg/dL (ref 0.44–1.00)
GFR calc Af Amer: >60 mL/min (ref >60)
GFR calc non Af Amer: >60 mL/min (ref >60)
Glucose, Bld: 181 mg/dL — ABNORMAL HIGH (ref 65–99)
Potassium: 3.4 mmol/L — ABNORMAL LOW (ref 3.5–5.1)
Sodium: 139 mmol/L (ref 135–145)
Total Bilirubin: 0.3 mg/dL (ref 0.3–1.2)
Total Protein: 6.9 g/dL (ref 6.5–8.1)

## 2016-05-18 LAB — PROTIME-INR
INR: 0.97
Prothrombin Time: 12.9 seconds (ref 11.4–15.2)

## 2016-05-18 LAB — CBC WITH DIFFERENTIAL/PLATELET
Basophils Absolute: 0 10*3/uL (ref 0.0–0.1)
Basophils Relative: 0 %
Eosinophils Absolute: 0.1 10*3/uL (ref 0.0–0.7)
Eosinophils Relative: 1 %
HCT: 41.7 % (ref 36.0–46.0)
Hemoglobin: 14.1 g/dL (ref 12.0–15.0)
Lymphocytes Relative: 39 %
Lymphs Abs: 3.3 10*3/uL (ref 0.7–4.0)
MCH: 29.2 pg (ref 26.0–34.0)
MCHC: 33.8 g/dL (ref 30.0–36.0)
MCV: 86.3 fL (ref 78.0–100.0)
Monocytes Absolute: 0.6 10*3/uL (ref 0.1–1.0)
Monocytes Relative: 7 %
Neutro Abs: 4.5 10*3/uL (ref 1.7–7.7)
Neutrophils Relative %: 53 %
Platelets: 315 10*3/uL (ref 150–400)
RBC: 4.83 MIL/uL (ref 3.87–5.11)
RDW: 13.1 % (ref 11.5–15.5)
WBC: 8.5 10*3/uL (ref 4.0–10.5)

## 2016-05-18 LAB — ECHOCARDIOGRAM COMPLETE
Height: 62.5 in
Weight: 2272 oz

## 2016-05-18 LAB — GLUCOSE, CAPILLARY
Glucose-Capillary: 199 mg/dL — ABNORMAL HIGH (ref 65–99)
Glucose-Capillary: 162 mg/dL — ABNORMAL HIGH (ref 65–99)
Glucose-Capillary: 177 mg/dL — ABNORMAL HIGH (ref 65–99)

## 2016-05-18 LAB — TROPONIN I: Troponin I: <0.03 ng/mL (ref <0.03)

## 2016-05-18 LAB — TSH: TSH: 1.118 u[IU]/mL (ref 0.350–4.500)

## 2016-05-18 SURGERY — ESOPHAGOGASTRODUODENOSCOPY (EGD) WITH PROPOFOL
Anesthesia: Monitor Anesthesia Care

## 2016-05-18 MED ORDER — SODIUM CHLORIDE 0.9 % IV SOLN
INTRAVENOUS | Status: AC
  Administered 2016-05-18: 13:00:00 via INTRAVENOUS

## 2016-05-18 MED ORDER — CYCLOBENZAPRINE HCL 10 MG PO TABS: 10.0000 mg | ORAL_TABLET | Freq: Three times a day (TID) | ORAL | Status: DC | PRN

## 2016-05-18 MED ORDER — DILTIAZEM HCL 25 MG/5ML IV SOLN
10.0000 mg | Freq: Once | INTRAVENOUS | Status: AC
  Administered 2016-05-18: 10 mg via INTRAVENOUS
  Filled 2016-05-18: qty 5

## 2016-05-18 MED ORDER — CHLORHEXIDINE GLUCONATE CLOTH 2 % EX PADS: 6.0000 | MEDICATED_PAD | Freq: Once | CUTANEOUS | Status: DC

## 2016-05-18 MED ORDER — LORAZEPAM 2 MG/ML IJ SOLN
1.0000 mg | Freq: Once | INTRAMUSCULAR | Status: AC
  Administered 2016-05-18: 1 mg via INTRAVENOUS
  Filled 2016-05-18: qty 1

## 2016-05-18 MED ORDER — IMIPRAMINE HCL 25 MG PO TABS
25.0000 mg | ORAL_TABLET | Freq: Every day | ORAL | Status: DC
  Administered 2016-05-18: 25 mg via ORAL
  Filled 2016-05-18: qty 1

## 2016-05-18 MED ORDER — SODIUM CHLORIDE 0.9 % IJ SOLN: 100.0000 [IU] | Freq: Once | INTRAMUSCULAR | Status: DC

## 2016-05-18 MED ORDER — METOPROLOL TARTRATE 25 MG PO TABS
25.0000 mg | ORAL_TABLET | Freq: Two times a day (BID) | ORAL | Status: DC
  Administered 2016-05-18 – 2016-05-19 (×3): 25 mg via ORAL
  Filled 2016-05-18 (×3): qty 1

## 2016-05-18 MED ORDER — SODIUM CHLORIDE 0.9 % IV BOLUS (SEPSIS)
500.0000 mL | Freq: Once | INTRAVENOUS | Status: AC
  Administered 2016-05-18: 500 mL via INTRAVENOUS

## 2016-05-18 MED ORDER — ACETAMINOPHEN 325 MG PO TABS: 650.0000 mg | ORAL_TABLET | ORAL | Status: DC | PRN

## 2016-05-18 MED ORDER — RIVAROXABAN 20 MG PO TABS
20.0000 mg | ORAL_TABLET | Freq: Every day | ORAL | Status: DC
  Administered 2016-05-18: 20 mg via ORAL
  Filled 2016-05-18: qty 1

## 2016-05-18 MED ORDER — ZOLPIDEM TARTRATE 5 MG PO TABS
5.0000 mg | ORAL_TABLET | Freq: Every evening | ORAL | Status: DC | PRN
  Administered 2016-05-18: 5 mg via ORAL
  Filled 2016-05-18: qty 1

## 2016-05-18 MED ORDER — INSULIN ASPART 100 UNIT/ML ~~LOC~~ SOLN
0.0000 [IU] | Freq: Three times a day (TID) | SUBCUTANEOUS | Status: DC
  Administered 2016-05-19: 1 [IU] via SUBCUTANEOUS
  Administered 2016-05-19: 3 [IU] via SUBCUTANEOUS

## 2016-05-18 MED ORDER — INSULIN ASPART 100 UNIT/ML ~~LOC~~ SOLN: 0.0000 [IU] | Freq: Every day | SUBCUTANEOUS | Status: DC

## 2016-05-18 MED ORDER — ONDANSETRON HCL 4 MG/2ML IJ SOLN: 4.0000 mg | Freq: Four times a day (QID) | INTRAMUSCULAR | Status: DC | PRN

## 2016-05-18 MED ORDER — SODIUM CHLORIDE 0.9 % IV SOLN
INTRAVENOUS | Status: DC
  Administered 2016-05-18: 08:00:00 via INTRAVENOUS

## 2016-05-18 MED ORDER — PANTOPRAZOLE SODIUM 40 MG PO TBEC
40.0000 mg | DELAYED_RELEASE_TABLET | Freq: Every day | ORAL | Status: DC
  Administered 2016-05-18 – 2016-05-19 (×2): 40 mg via ORAL
  Filled 2016-05-18 (×2): qty 1

## 2016-05-18 NOTE — ED Notes (Signed)
ECHO in progress at this time 

## 2016-05-18 NOTE — ED Notes (Signed)
Report given to Lattie Haw, RN at this time for room 337. Echo still in progress at this time.

## 2016-05-18 NOTE — Progress Notes (Signed)
*  PRELIMINARY RESULTS* Echocardiogram 2D Echocardiogram has been performed.  Samuel Germany 05/18/2016, 3:02 PM

## 2016-05-18 NOTE — H&P (Signed)
History and Physical    Katrina Castro [id number] DOB: May 18, 1946 DOA: 05/18/2016  PCP: Glenda Chroman, MD  Patient coming from: home  Chief Complaint: abnormal ekg  HPI: Katrina Castro is a 70 y.o. female with medical history significant of achalasia who had come to the hospital today for endoscopic esophageal dilatation. While in preop, she was noted to be tachycardic. When she was placed on the monitor, she was noted to be in rapid atrial fibrillation. Patient denied any palpitations. She does describe chest pain, but this is after eating and felt to be related to achalasia. Denies any shortness of breath. She was sent to the ER for further evaluation.  ED Course: In the ED she was noted to be in rapid atrial fibrillation with heart rates in the 120s to 130s. She was treated with intravenous diltiazem and had good response. Blood work is otherwise unrevealing. She is being admitted for further treatment.  Review of Systems: As per HPI otherwise 10 point review of systems negative.    Past Medical History:  Diagnosis Date  . Arthritis   . Diabetes ([place])   . Dysphagia   . GERD (gastroesophageal reflux disease)   . Headache   . History of bronchitis   . Seasonal allergies   . Toenail fungus     Past Surgical History:  Procedure Laterality Date  . back surgeries x 4    . COLONOSCOPY N/A 11/05/2012   Procedure: COLONOSCOPY;  Surgeon: Rogene Houston, MD;  [facility];  Service: Endoscopy;  Laterality: N/A;  1030  . ESOPHAGEAL DILATION N/A 07/06/2015   Procedure: ESOPHAGEAL DILATION;  Surgeon: Rogene Houston, MD;  [facility];  Service: Endoscopy;  Laterality: N/A;  . ESOPHAGOGASTRODUODENOSCOPY N/A 02/12/2014   Procedure: ESOPHAGOGASTRODUODENOSCOPY (EGD);  Surgeon: Rogene Houston, MD;  [facility];  Service: Endoscopy;  Laterality: N/A;  150  . ESOPHAGOGASTRODUODENOSCOPY N/A 07/06/2015   Procedure: ESOPHAGOGASTRODUODENOSCOPY (EGD);  Surgeon:  Rogene Houston, MD;  [facility];  Service: Endoscopy;  Laterality: N/A;  1:25 - moved to 1/18 @ 10:30 - Ann to notify pt  . ESOPHAGOGASTRODUODENOSCOPY (EGD) WITH ESOPHAGEAL DILATION N/A 07/25/2012   Procedure: ESOPHAGOGASTRODUODENOSCOPY (EGD) WITH ESOPHAGEAL DILATION;  Surgeon: Rogene Houston, MD;  [facility];  Service: Endoscopy;  Laterality: N/A;  325-rescheduled to [place] notified pt  . Foot surgeries Bilateral    hammer toes  . MALONEY DILATION N/A 02/12/2014   Procedure: Venia Minks DILATION;  Surgeon: Rogene Houston, MD;  [facility];  Service: Endoscopy;  Laterality: N/A;  . Right knee arthroscopy     x2  . TOTAL ABDOMINAL HYSTERECTOMY       reports that she has never smoked. She has never used smokeless tobacco. She reports that she does not drink alcohol or use drugs.  Allergies  Allergen Reactions  . Cardizem [Diltiazem] Other (See Comments)    Patient states that she could not think, felt that she was in the twilight zone. Arm discomfort.  . Other     Antibiotic that started with an "O"-caused anaphylaxis  . Sulfa Antibiotics Itching    Family History  Problem Relation Age of Onset  . Stroke Father   . Diabetes Mother   . Diabetes Sister   . Colon cancer Neg Hx     Prior to Admission medications   Medication Sig Start Date End Date Taking? Authorizing Provider  cetirizine (ZYRTEC) 10 MG tablet  Take 10 mg by mouth daily as needed for allergies.    Yes Historical Provider, MD  cyclobenzaprine (FLEXERIL) 10 MG tablet Take 10 mg by mouth 3 (three) times daily as needed for muscle spasms.   Yes Historical Provider, MD  imipramine (TOFRANIL) 25 MG tablet Take 1 tablet (25 mg total) by mouth at bedtime. 08/16/15  Yes Rogene Houston, MD  metFORMIN (GLUCOPHAGE) 500 MG tablet Take 500 mg by mouth 2 (two) times daily with a meal.   Yes Historical Provider, MD  pantoprazole (PROTONIX) 40 MG tablet Take 40 mg by mouth daily.   Yes Historical  Provider, MD    Physical Exam: Vitals:   05/18/16 1400 05/18/16 1415 05/18/16 1430 05/18/16 1530  BP: 106/61 113/71 96/57 132/69  Pulse: 91 93 87 86  Resp: 18 19 15 18   Temp:    97.5 F (36.4 C)  TempSrc:    Oral  SpO2: 96% 95% 96% 99%  Weight:    63.7 kg (140 lb 8 oz)  Height:    5\' 3"  (1.6 m)      Constitutional: NAD, calm, comfortable Vitals:   05/18/16 1400 05/18/16 1415 05/18/16 1430 05/18/16 1530  BP: 106/61 113/71 96/57 132/69  Pulse: 91 93 87 86  Resp: 18 19 15 18   Temp:    97.5 F (36.4 C)  TempSrc:    Oral  SpO2: 96% 95% 96% 99%  Weight:    63.7 kg (140 lb 8 oz)  Height:    5\' 3"  (1.6 m)   Eyes: PERRL, lids and conjunctivae normal ENMT: Mucous membranes are moist. Posterior pharynx clear of any exudate or lesions.Normal dentition.  Neck: normal, supple, no masses, no thyromegaly Respiratory: clear to auscultation bilaterally, no wheezing, no crackles. Normal respiratory effort. No accessory muscle use.  Cardiovascular: irregular rate and rhythm, no murmurs / rubs / gallops. No extremity edema. 2+ pedal pulses. No carotid bruits.  Abdomen: no tenderness, no masses palpated. No hepatosplenomegaly. Bowel sounds positive.  Musculoskeletal: no clubbing / cyanosis. No joint deformity upper and lower extremities. Good ROM, no contractures. Normal muscle tone.  Skin: no rashes, lesions, ulcers. No induration Neurologic: CN 2-12 grossly intact. Sensation intact, DTR normal. Strength 5/5 in all 4.  Psychiatric: Normal judgment and insight. Alert and oriented x 3. Normal mood.     Labs on Admission: I have personally reviewed following labs and imaging studies  CBC:  Recent Labs Lab 05/16/16 1112 05/18/16 0747  WBC 8.2 8.5  NEUTROABS 4.1 4.5  HGB 13.3 14.1  HCT 39.5 41.7  MCV 87.2 86.3  PLT 309 [id number]   Basic Metabolic Panel:  Recent Labs Lab 05/16/16 1112 05/18/16 0747  NA 140 139  K 3.3* 3.4*  CL 106 107  CO2 25 26  GLUCOSE 139* 181*  BUN 11 9    CREATININE 0.64 0.69  CALCIUM 9.7 9.5   GFR: Estimated Creatinine Clearance: 59.6 mL/min (by C-G formula based on SCr of 0.69 mg/dL). Liver Function Tests:  Recent Labs Lab 05/18/16 0747  AST 24  ALT 23  ALKPHOS 58  BILITOT 0.3  PROT 6.9  ALBUMIN 4.0   No results for input(s): LIPASE, AMYLASE in the last 168 hours. No results for input(s): AMMONIA in the last 168 hours. Coagulation Profile:  Recent Labs Lab 05/18/16 0747  INR 0.97   Cardiac Enzymes: No results for input(s): CKTOTAL, CKMB, CKMBINDEX, TROPONINI in the last 168 hours. BNP (last 3 results) No results for input(s): PROBNP in the  last 8760 hours. HbA1C: No results for input(s): HGBA1C in the last 72 hours. CBG:  Recent Labs Lab 05/18/16 0642 05/18/16 1757  GLUCAP 199* 162*   Lipid Profile: No results for input(s): CHOL, HDL, LDLCALC, TRIG, CHOLHDL, LDLDIRECT in the last 72 hours. Thyroid Function Tests: No results for input(s): TSH, T4TOTAL, FREET4, T3FREE, THYROIDAB in the last 72 hours. Anemia Panel: No results for input(s): VITAMINB12, FOLATE, FERRITIN, TIBC, IRON, RETICCTPCT in the last 72 hours. Urine analysis: No results found for: COLORURINE, APPEARANCEUR, LABSPEC, PHURINE, GLUCOSEU, HGBUR, BILIRUBINUR, KETONESUR, PROTEINUR, UROBILINOGEN, NITRITE, LEUKOCYTESUR Sepsis Labs: !!!!!!!!!!!!!!!!!!!!!!!!!!!!!!!!!!!!!!!!!!!! @LABRCNTIP (procalcitonin:4,lacticidven:4) )No results found for this or any previous visit (from the past 240 hour(s)).   Radiological Exams on Admission: Dg Chest Port 1 View  Result Date: 05/18/2016 CLINICAL DATA:  Palpitations. EXAM: PORTABLE CHEST 1 VIEW COMPARISON:  06/21/2015 FINDINGS: The cardiomediastinal silhouette is within normal limits. The patient has taken a slightly shallower inspiration than on the prior study, and there is mild bronchovascular crowding. No confluent airspace opacity, edema, pleural effusion, or pneumothorax is identified. No acute osseous  abnormality is seen. IMPRESSION: No active disease. Electronically Signed   By: Logan Bores M.D.   On: 05/18/2016 08:13    EKG: Independently reviewed. A fib rvr  Assessment/Plan Active Problems:   GERD (gastroesophageal reflux disease)   Achalasia   New onset a-fib ([facility])   Hypokalemia   Diabetes ([place])   Atrial fibrillation ([place])    1. New onset atrial fibrillation. CHADSVASc score of 3. We'll start rate control with metoprolol twice a day. Anticoagulation with Xarelto. Check thyroid studies, cardiac enzymes and echocardiogram  2. Diabetes. Hold metformin, start sliding scale insulin  3. Achalasia. Patient will need to follow-up with GI to reschedule esophageal procedure. Will place patient on full liquids for now for comfort  4. Hypokalemia. Replace  5. GERD. Continue PPI   DVT prophylaxis: xarelto Code Status: full Family Communication: no family present Disposition Plan: discharge home once improved Consults called:  Admission status: observation   Anjelita Sheahan MD [facility] Pager [phone]  If 7PM-7AM, please contact night-coverage [email or web address] Password [facility]  05/18/2016, 6:49 PM

## 2016-05-18 NOTE — ED Provider Notes (Addendum)
[facility] DEPT Provider Note   CSN: [id number] Arrival date & time: 05/18/16  0731     History   Chief Complaint Chief Complaint  Patient presents with  . Atrial Fibrillation    HPI Katrina Castro is a 70 y.o. female.  Patient sent over from GI suite for new onset atrial fibrillation with rapid heart rate. Patient was due to have dilatation of her esophagus today. Patient with no known history of atrial fibrillation in the past. Patient also not able to tell that her heart was going fast. Patient stated she did not sleep well last night but otherwise felt fine. No chest pain no shortness of breath.      Past Medical History:  Diagnosis Date  . Arthritis   . Diabetes ([place])   . Dysphagia   . GERD (gastroesophageal reflux disease)   . Headache   . History of bronchitis   . Seasonal allergies   . Toenail fungus     Patient Active Problem List   Diagnosis Date Noted  . Achalasia 05/15/2016  . Migraine without aura and without status migrainosus, not intractable 05/10/2015  . GERD (gastroesophageal reflux disease) 01/07/2012  . Dysphagia 11/26/2011    Past Surgical History:  Procedure Laterality Date  . back surgeries x 4    . COLONOSCOPY N/A 11/05/2012   Procedure: COLONOSCOPY;  Surgeon: Rogene Houston, MD;  [facility];  Service: Endoscopy;  Laterality: N/A;  1030  . ESOPHAGEAL DILATION N/A 07/06/2015   Procedure: ESOPHAGEAL DILATION;  Surgeon: Rogene Houston, MD;  [facility];  Service: Endoscopy;  Laterality: N/A;  . ESOPHAGOGASTRODUODENOSCOPY N/A 02/12/2014   Procedure: ESOPHAGOGASTRODUODENOSCOPY (EGD);  Surgeon: Rogene Houston, MD;  [facility];  Service: Endoscopy;  Laterality: N/A;  150  . ESOPHAGOGASTRODUODENOSCOPY N/A 07/06/2015   Procedure: ESOPHAGOGASTRODUODENOSCOPY (EGD);  Surgeon: Rogene Houston, MD;  [facility];  Service: Endoscopy;  Laterality: N/A;  1:25 - moved to 1/18 @ 10:30 - Ann to notify pt    . ESOPHAGOGASTRODUODENOSCOPY (EGD) WITH ESOPHAGEAL DILATION N/A 07/25/2012   Procedure: ESOPHAGOGASTRODUODENOSCOPY (EGD) WITH ESOPHAGEAL DILATION;  Surgeon: Rogene Houston, MD;  [facility];  Service: Endoscopy;  Laterality: N/A;  325-rescheduled to [place] notified pt  . Foot surgeries Bilateral    hammer toes  . MALONEY DILATION N/A 02/12/2014   Procedure: Venia Minks DILATION;  Surgeon: Rogene Houston, MD;  [facility];  Service: Endoscopy;  Laterality: N/A;  . Right knee arthroscopy     x2  . TOTAL ABDOMINAL HYSTERECTOMY      OB History    No data available       Home Medications    Prior to Admission medications   Medication Sig Start Date End Date Taking? Authorizing Provider  cetirizine (ZYRTEC) 10 MG tablet Take 10 mg by mouth daily as needed for allergies.    Yes Historical Provider, MD  cyclobenzaprine (FLEXERIL) 10 MG tablet Take 10 mg by mouth 3 (three) times daily as needed for muscle spasms.   Yes Historical Provider, MD  imipramine (TOFRANIL) 25 MG tablet Take 1 tablet (25 mg total) by mouth at bedtime. 08/16/15  Yes Rogene Houston, MD  metFORMIN (GLUCOPHAGE) 500 MG tablet Take 500 mg by mouth 2 (two) times daily with a meal.   Yes Historical Provider, MD  pantoprazole (PROTONIX) 40 MG tablet Take 40 mg by mouth daily.   Yes Historical Provider, MD  Family History Family History  Problem Relation Age of Onset  . Stroke Father   . Diabetes Mother   . Diabetes Sister   . Colon cancer Neg Hx     Social History Social History  Substance Use Topics  . Smoking status: Never Smoker  . Smokeless tobacco: Never Used  . Alcohol use No     Comment: socially      Allergies   Cardizem [diltiazem]; Other; and Sulfa antibiotics   Review of Systems Review of Systems  Constitutional: Negative for fever.  HENT: Negative for congestion.   Eyes: Negative for redness.  Respiratory: Negative for shortness of breath.   Cardiovascular: Negative  for chest pain, palpitations and leg swelling.  Gastrointestinal: Negative for abdominal pain, nausea and vomiting.  Genitourinary: Negative for dysuria.  Musculoskeletal: Negative for back pain.  Skin: Negative for rash.  Neurological: Negative for headaches.  Hematological: Does not bruise/bleed easily.  Psychiatric/Behavioral: Negative for confusion.     Physical Exam Updated Vital Signs BP 99/69   Pulse 89   Temp 98.5 F (36.9 C) (Oral)   Resp 25   Ht 5' 2.5" (1.588 m)   Wt 64.4 kg   SpO2 97%   BMI 25.56 kg/m   Physical Exam  Constitutional: She is oriented to person, place, and time. She appears well-developed and well-nourished. No distress.  HENT:  Head: Normocephalic and atraumatic.  Mouth/Throat: Oropharynx is clear and moist.  Eyes: EOM are normal. Pupils are equal, round, and reactive to light.  Neck: Normal range of motion. Neck supple.  Cardiovascular: Normal heart sounds.   Rapid heart rate irregular rate  Pulmonary/Chest: Effort normal and breath sounds normal. No respiratory distress.  Abdominal: Soft. Bowel sounds are normal.  Musculoskeletal: Normal range of motion. She exhibits no edema.  Neurological: She is alert and oriented to person, place, and time. No cranial nerve deficit. She exhibits normal muscle tone.  Skin: Skin is warm. Capillary refill takes less than 2 seconds.     ED Treatments / Results  Labs (all labs ordered are listed, but only abnormal results are displayed) Labs Reviewed  GLUCOSE, CAPILLARY - Abnormal; Notable for the following:       Result Value   Glucose-Capillary 199 (*)    All other components within normal limits  COMPREHENSIVE METABOLIC PANEL - Abnormal; Notable for the following:    Potassium 3.4 (*)    Glucose, Bld 181 (*)    All other components within normal limits  CBC WITH DIFFERENTIAL/PLATELET  PROTIME-INR    EKG  EKG Interpretation None       Radiology Dg Chest Port 1 View  Result Date:  05/18/2016 CLINICAL DATA:  Palpitations. EXAM: PORTABLE CHEST 1 VIEW COMPARISON:  06/21/2015 FINDINGS: The cardiomediastinal silhouette is within normal limits. The patient has taken a slightly shallower inspiration than on the prior study, and there is mild bronchovascular crowding. No confluent airspace opacity, edema, pleural effusion, or pneumothorax is identified. No acute osseous abnormality is seen. IMPRESSION: No active disease. Electronically Signed   By: Logan Bores M.D.   On: 05/18/2016 08:13    Procedures Procedures (including critical care time)   CRITICAL CARE Performed by: Fredia Sorrow Total critical care time: 30 minutes Critical care time was exclusive of separately billable procedures and treating other patients. Critical care was necessary to treat or prevent imminent or life-threatening deterioration. Critical care was time spent personally by me on the following activities: development of treatment plan with patient and/or surrogate as  well as nursing, discussions with consultants, evaluation of patient's response to treatment, examination of patient, obtaining history from patient or surrogate, ordering and performing treatments and interventions, ordering and review of laboratory studies, ordering and review of radiographic studies, pulse oximetry and re-evaluation of patient's condition.     Medications Ordered in ED Medications  0.9 %  sodium chloride infusion ( Intravenous New Bag/Given 05/18/16 0816)  sodium chloride 0.9 % bolus 500 mL (0 mLs Intravenous Stopped 05/18/16 0920)  LORazepam (ATIVAN) injection 1 mg (1 mg Intravenous Given 05/18/16 0818)  diltiazem (CARDIZEM) injection 10 mg (10 mg Intravenous Given 05/18/16 0918)     Initial Impression / Assessment and Plan / ED Course  I have reviewed the triage vital signs and the nursing notes.  Pertinent labs & imaging results that were available during my care of the patient were reviewed by me and considered  in my medical decision making (see chart for details).  Clinical Course    Patient sent over from GI suite. Patient was due to have upper endoscopy and dilatation of her esophagus. Patient noted to be in atrial fibrillation with heart rate as high as 140 but most the time around 1:15. Patient with no known history of atrial fibrillation. Patient patient with questionable allergy to diltiazem. Contacted her regular doctor they did not have a listing of this. Done at the GI medicine at one time for nutcracker esophagus had her on oral diltiazem. Patient had side effects no significant allergy. Based on this patient was given 10 mg of diltiazem here. Her heart rate responded well still in and out of atrial fibrillation however now down into the 70s. Blood pressure fine. Patient feels fine.  Patient will require admission for new onset atrial fib. Patient has history of diabetes. No history of any arrhythmias.   Final Clinical Impressions(s) / ED Diagnoses   Final diagnoses:  Atrial fibrillation with RVR [facility])    New Prescriptions New Prescriptions   No medications on file     Fredia Sorrow, MD 05/18/16 [place], MD 05/18/16 1014

## 2016-05-18 NOTE — Progress Notes (Signed)
Patient presented to [facility] for EGD with ED. She was noted to have atrial fibrillation with RVR. There is no prior history of A. Fib. She also complains of fullness across upper chest. She says it swells up when she gets nervous. She is not having any shortness of breath. EGD canceled. Patient is being transferred to ER for further evaluation of new onset A. fib for RVR.

## 2016-05-18 NOTE — ED Triage Notes (Signed)
Pt here as out patient for EGD with esophageal stretching.   EKG done this morning and pt in A fib with RVR,   Pt denies any chest ain or sob

## 2016-05-18 NOTE — Progress Notes (Signed)
Pt here for OP EGD with dilation with propofol. Found to be in new onset afib with RVR. Dr Laural Golden notified. Transferred to ED via wheelchair with friend, Pam. Report given to Susa Day, RN.

## 2016-05-19 DIAGNOSIS — E118 Type 2 diabetes mellitus with unspecified complications: Secondary | ICD-10-CM | POA: Diagnosis not present

## 2016-05-19 DIAGNOSIS — K219 Gastro-esophageal reflux disease without esophagitis: Secondary | ICD-10-CM | POA: Diagnosis not present

## 2016-05-19 DIAGNOSIS — I4891 Unspecified atrial fibrillation: Secondary | ICD-10-CM | POA: Diagnosis not present

## 2016-05-19 DIAGNOSIS — K220 Achalasia of cardia: Secondary | ICD-10-CM | POA: Diagnosis not present

## 2016-05-19 LAB — BASIC METABOLIC PANEL
Anion gap: 4 — ABNORMAL LOW (ref 5–15)
BUN: 9 mg/dL (ref 6–20)
CO2: 25 mmol/L (ref 22–32)
Calcium: 8.8 mg/dL — ABNORMAL LOW (ref 8.9–10.3)
Chloride: 111 mmol/L (ref 101–111)
Creatinine, Ser: 0.6 mg/dL (ref 0.44–1.00)
GFR calc Af Amer: >60 mL/min (ref >60)
GFR calc non Af Amer: >60 mL/min (ref >60)
Glucose, Bld: 161 mg/dL — ABNORMAL HIGH (ref 65–99)
Potassium: 3.8 mmol/L (ref 3.5–5.1)
Sodium: 140 mmol/L (ref 135–145)

## 2016-05-19 LAB — GLUCOSE, CAPILLARY
Glucose-Capillary: 146 mg/dL — ABNORMAL HIGH (ref 65–99)
Glucose-Capillary: 204 mg/dL — ABNORMAL HIGH (ref 65–99)

## 2016-05-19 LAB — TROPONIN I
Troponin I: <0.03 ng/mL (ref <0.03)
Troponin I: <0.03 ng/mL (ref <0.03)

## 2016-05-19 MED ORDER — RIVAROXABAN 20 MG PO TABS: 20.0000 mg | ORAL_TABLET | Freq: Every day | ORAL | 1 refills | Status: DC

## 2016-05-19 MED ORDER — METOPROLOL TARTRATE 25 MG PO TABS: 25.0000 mg | ORAL_TABLET | Freq: Two times a day (BID) | ORAL | 0 refills | Status: DC

## 2016-05-19 NOTE — Discharge Summary (Signed)
Physician Discharge Summary  Katrina Castro [id number] DOB: 1946-04-17 DOA: 05/18/2016  PCP: Glenda Chroman, MD  Admit date: 05/18/2016 Discharge date: 05/19/2016  Admitted From:  Disposition:  home  Recommendations for Outpatient Follow-up:  1. Follow up with PCP in 1-2 weeks 2. Follow up with Dr. Laural Golden to reschedule EGD  Home Health: Equipment/Devices:  Discharge Condition:stable CODE STATUS: full Diet recommendation: Heart Healthy / Carb Modified   Brief/Interim Summary: Katrina Castro is a 70 y.o. female with medical history significant of achalasia who had come to the hospital today for endoscopic esophageal dilatation. While in preop, she was noted to be tachycardic. When she was placed on the monitor, she was noted to be in rapid atrial fibrillation. Patient denied any palpitations. She does describe chest pain, but this is after eating and felt to be related to achalasia. Denies any shortness of breath. She was sent to the ER for further evaluation.  ED Course: In the ED she was noted to be in rapid atrial fibrillation with heart rates in the 120s to 130s. She was treated with intravenous diltiazem and had good response. Blood work is otherwise unrevealing. She is being admitted for further treatment.  Patient was monitored on telemetry overnight. Heart rates stayed stable in the 90s at rest and on ambulation with metoprolol. Echo was unremarkable. TSH normal. CHADSVASc is 3, so she was started on Xarelto. She is doing well at this time and feels ready to discharge home. She will need to follow up with Dr. Laural Golden to reschedule her EGD.  Discharge Diagnoses:  Active Problems:   GERD (gastroesophageal reflux disease)   Achalasia   New onset a-fib (HCC)   Hypokalemia   Diabetes ([place])   Atrial fibrillation [facility])    Discharge Instructions  Discharge Instructions    Diet - low sodium heart healthy    Complete by:  As directed    Increase activity slowly    Complete  by:  As directed        Medication List    TAKE these medications   cetirizine 10 MG tablet Commonly known as:  ZYRTEC Take 10 mg by mouth daily as needed for allergies.   cyclobenzaprine 10 MG tablet Commonly known as:  FLEXERIL Take 10 mg by mouth 3 (three) times daily as needed for muscle spasms.   imipramine 25 MG tablet Commonly known as:  TOFRANIL Take 1 tablet (25 mg total) by mouth at bedtime.   metFORMIN 500 MG tablet Commonly known as:  GLUCOPHAGE Take 500 mg by mouth 2 (two) times daily with a meal.   metoprolol tartrate 25 MG tablet Commonly known as:  LOPRESSOR Take 1 tablet (25 mg total) by mouth 2 (two) times daily.   pantoprazole 40 MG tablet Commonly known as:  PROTONIX Take 40 mg by mouth daily.   rivaroxaban 20 MG Tabs tablet Commonly known as:  XARELTO Take 1 tablet (20 mg total) by mouth daily with supper.       Allergies  Allergen Reactions  . Cardizem [Diltiazem] Other (See Comments)    Patient states that she could not think, felt that she was in the twilight zone. Arm discomfort.  . Other     Antibiotic that started with an "O"-caused anaphylaxis  . Sulfa Antibiotics Itching    Consultations:     Procedures/Studies: Dg Esophagus  Result Date: 05/07/2016 CLINICAL DATA:  Dysphagia, particularly for meats and breads, history of prior stricture EXAM: ESOPHOGRAM / BARIUM SWALLOW / BARIUM  TABLET STUDY TECHNIQUE: Combined double contrast and single contrast examination performed using effervescent crystals, thick barium liquid, and thin barium liquid. The patient was observed with fluoroscopy swallowing a 13 mm barium sulphate tablet. FLUOROSCOPY TIME:  Fluoroscopy Time:  1 minutes 42 seconds Radiation Exposure Index (if provided by the fluoroscopic device): 15.7 mGy Number of Acquired Spot Images: Multiple screen captures during fluoroscopy COMPARISON:  07/22/2012 FINDINGS: Diffuse esophageal dysmotility with incomplete clearance of barium  by primary peristaltic waves. Secondary and tertiary waves identified as well as intermittent retrograde peristalsis. Smooth appearance of esophageal mucosa without irregularity or ulceration. No persistent intraluminal filling defects. Relative narrowing of the gastroesophageal junction, temporarily obstructing a 12.5 mm diameter barium tablet. The barium tablet did not pass into the stomach following multiple swallows of water but potentially past into the stomach following a swallow of thick barium. Remainder of esophagus distended normally. No hiatal hernia or reflux identified during the period of imaging. Targeted rapid sequence imaging of the cervical esophagus and hypopharynx showed no laryngeal penetration or aspiration. IMPRESSION: Relative narrowing of the gastroesophageal junction, temporarily delaying a 12.5 mm barium tablet though this eventually did pass into the stomach following a swallow of thick barium. Diffuse esophageal dysmotility. Electronically Signed   By: Lavonia Dana M.D.   On: 05/07/2016 11:28   Dg Chest Port 1 View  Result Date: 05/18/2016 CLINICAL DATA:  Palpitations. EXAM: PORTABLE CHEST 1 VIEW COMPARISON:  06/21/2015 FINDINGS: The cardiomediastinal silhouette is within normal limits. The patient has taken a slightly shallower inspiration than on the prior study, and there is mild bronchovascular crowding. No confluent airspace opacity, edema, pleural effusion, or pneumothorax is identified. No acute osseous abnormality is seen. IMPRESSION: No active disease. Electronically Signed   By: Logan Bores M.D.   On: 05/18/2016 08:13    Echo: - Left ventricle: The cavity size was normal. Wall thickness was   normal. Systolic function was normal. The estimated ejection   fraction was in the range of 60% to 65%. Wall motion was normal;   there were no regional wall motion abnormalities. Doppler   parameters are consistent with abnormal left ventricular   relaxation (grade 1 diastolic  dysfunction).   Subjective:  Feeling better today. No palpitations or shortness of breath  Discharge Exam: Vitals:   05/18/16 2108 05/19/16 0509  BP: (!) 115/57 108/62  Pulse: 76 82  Resp: 18 16  Temp: 97.8 F (36.6 C) 98 F (36.7 C)   Vitals:   05/18/16 1430 05/18/16 1530 05/18/16 2108 05/19/16 0509  BP: 96/57 132/69 (!) 115/57 108/62  Pulse: 87 86 76 82  Resp: 15 18 18 16   Temp:  97.5 F (36.4 C) 97.8 F (36.6 C) 98 F (36.7 C)  TempSrc:  Oral Oral Oral  SpO2: 96% 99% 97% 96%  Weight:  63.7 kg (140 lb 8 oz)    Height:  5\' 3"  (1.6 m)      General: Pt is alert, awake, not in acute distress Cardiovascular: RRR, S1/S2 +, no rubs, no gallops Respiratory: CTA bilaterally, no wheezing, no rhonchi Abdominal: Soft, NT, ND, bowel sounds + Extremities: no edema, no cyanosis    The results of significant diagnostics from this hospitalization (including imaging, microbiology, ancillary and laboratory) are listed below for reference.     Microbiology: No results found for this or any previous visit (from the past 240 hour(s)).   Labs: BNP (last 3 results) No results for input(s): BNP in the last 8760 hours. Basic Metabolic  Panel:  Recent Labs Lab 05/16/16 1112 05/18/16 0747 05/19/16 0657  NA 140 139 140  K 3.3* 3.4* 3.8  CL 106 107 111  CO2 25 26 25   GLUCOSE 139* 181* 161*  BUN 11 9 9   CREATININE 0.64 0.69 0.60  CALCIUM 9.7 9.5 8.8*   Liver Function Tests:  Recent Labs Lab 05/18/16 0747  AST 24  ALT 23  ALKPHOS 58  BILITOT 0.3  PROT 6.9  ALBUMIN 4.0   No results for input(s): LIPASE, AMYLASE in the last 168 hours. No results for input(s): AMMONIA in the last 168 hours. CBC:  Recent Labs Lab 05/16/16 1112 05/18/16 0747  WBC 8.2 8.5  NEUTROABS 4.1 4.5  HGB 13.3 14.1  HCT 39.5 41.7  MCV 87.2 86.3  PLT 309 315   Cardiac Enzymes:  Recent Labs Lab 05/18/16 1923 05/19/16 0130 05/19/16 0657  TROPONINI <0.03 <0.03 <0.03   BNP: Invalid  input(s): POCBNP CBG:  Recent Labs Lab 05/18/16 0642 05/18/16 1757 05/18/16 2104 05/19/16 0752 05/19/16 1211  GLUCAP 199* 162* 177* 146* 204*   D-Dimer No results for input(s): DDIMER in the last 72 hours. Hgb A1c No results for input(s): HGBA1C in the last 72 hours. Lipid Profile No results for input(s): CHOL, HDL, LDLCALC, TRIG, CHOLHDL, LDLDIRECT in the last 72 hours. Thyroid function studies  Recent Labs  05/18/16 1923  TSH 1.118   Anemia work up No results for input(s): VITAMINB12, FOLATE, FERRITIN, TIBC, IRON, RETICCTPCT in the last 72 hours. Urinalysis No results found for: COLORURINE, APPEARANCEUR, LABSPEC, [place], GLUCOSEU, HGBUR, BILIRUBINUR, KETONESUR, PROTEINUR, UROBILINOGEN, NITRITE, LEUKOCYTESUR Sepsis Labs Invalid input(s): PROCALCITONIN,  WBC,  LACTICIDVEN Microbiology No results found for this or any previous visit (from the past 240 hour(s)).   Time coordinating discharge: Over 30 minutes  SIGNED:   Kathie Dike, MD  [facility] 05/19/2016, 1:32 PM Pager   If 7PM-7AM, please contact night-coverage [email or web address] Password [email or web address]

## 2016-05-19 NOTE — Progress Notes (Signed)
Nursing staff was able to provide a Xarelto card to use at pharmacy.

## 2016-05-19 NOTE — Progress Notes (Signed)
Patient ambulated around unit w/o difficulty. Patient denied any c/o chest pain or discomfort.

## 2016-05-19 NOTE — Progress Notes (Signed)
1320 d/c instructions and paperwork given to patient and patient's friend. IV catheter removed from patient's RIGHT hand, intact w/no s/s of infection/infiltration noted at this time. Telemetry monitor and wiring removed from patient and returned to front desk basket. Central telemetry Lanelle Bal) notified and made aware of patient's d/c home. Patient's friend at bedside to transport patient home.

## 2016-05-21 DIAGNOSIS — I4891 Unspecified atrial fibrillation: Secondary | ICD-10-CM | POA: Diagnosis not present

## 2016-05-21 DIAGNOSIS — Z299 Encounter for prophylactic measures, unspecified: Secondary | ICD-10-CM | POA: Diagnosis not present

## 2016-05-21 DIAGNOSIS — Z09 Encounter for follow-up examination after completed treatment for conditions other than malignant neoplasm: Secondary | ICD-10-CM | POA: Diagnosis not present

## 2016-05-23 ENCOUNTER — Ambulatory Visit (INDEPENDENT_AMBULATORY_CARE_PROVIDER_SITE_OTHER): Payer: Medicare Other | Admitting: Cardiovascular Disease

## 2016-05-23 ENCOUNTER — Encounter: Payer: Self-pay | Admitting: Cardiovascular Disease

## 2016-05-23 VITALS — BP 103/67 | HR 80 | Ht 63.0 in | Wt 141.0 lb

## 2016-05-23 DIAGNOSIS — I480 Paroxysmal atrial fibrillation: Secondary | ICD-10-CM

## 2016-05-23 DIAGNOSIS — Z7901 Long term (current) use of anticoagulants: Secondary | ICD-10-CM

## 2016-05-23 DIAGNOSIS — Z5181 Encounter for therapeutic drug level monitoring: Secondary | ICD-10-CM | POA: Diagnosis not present

## 2016-05-23 NOTE — Patient Instructions (Signed)
Medication Instructions:  Your physician recommends that you continue on your current medications as directed. Please refer to the Current Medication list given to you today.   Labwork: NONE  Testing/Procedures: NONE  Follow-Up: Your physician recommends that you schedule a follow-up appointment in: 3 MONTHS    Any Other Special Instructions Will Be Listed Below (If Applicable).     If you need a refill on your cardiac medications before your next appointment, please call your pharmacy.   

## 2016-05-23 NOTE — Progress Notes (Signed)
CARDIOLOGY CONSULT NOTE  Patient ID: Katrina Castro MRN: [id number] DOB/AGE: 19-Feb-1946 70 y.o.  Admit date: (Not on file) Primary Physician: Glenda Chroman, MD Referring Physician:   Reason for Consultation: atrial fibrillation  HPI: The patient is a 70 year old woman referred for the management of atrial fibrillation. She is currently taking metoprolol and Xarelto. She had an adverse reaction in the past to diltiazem. This was diagnosed within the past week when she presented to the hospital for endoscopic esophageal dilatation. She was then found to be in rapid atrial fibrillation.  Echocardiogram 05/18/16: Normal left ventricular systolic function, LVEF [id number], grade 1 diastolic dysfunction. The left atrium was normal in size.  The patient denies any symptoms of chest pain, palpitations, shortness of breath, lightheadedness, dizziness, leg swelling, orthopnea, PND, and syncope.  She does feel cold since starting Xarelto.  Soc: Divorced. Son in [place], daughter in [place].  Fam: Father died of CVA at 80.  Allergies  Allergen Reactions  . Cardizem [Diltiazem] Other (See Comments)    Patient states that she could not think, felt that she was in the twilight zone. Arm discomfort.  . Other     Antibiotic that started with an "O"-caused anaphylaxis  . Sulfa Antibiotics Itching    Current Outpatient Prescriptions  Medication Sig Dispense Refill  . cetirizine (ZYRTEC) 10 MG tablet Take 10 mg by mouth daily as needed for allergies.     . cyclobenzaprine (FLEXERIL) 10 MG tablet Take 10 mg by mouth 3 (three) times daily as needed for muscle spasms.    Marland Kitchen imipramine (TOFRANIL) 25 MG tablet Take 1 tablet (25 mg total) by mouth at bedtime. 30 tablet 5  . metFORMIN (GLUCOPHAGE) 500 MG tablet Take 500 mg by mouth 2 (two) times daily with a meal.    . metoprolol tartrate (LOPRESSOR) 25 MG tablet Take 1 tablet (25 mg total) by mouth 2 (two) times daily. 60 tablet 0  .  pantoprazole (PROTONIX) 40 MG tablet Take 40 mg by mouth daily.    . rivaroxaban (XARELTO) 20 MG TABS tablet Take 1 tablet (20 mg total) by mouth daily with supper. 30 tablet 1   No current facility-administered medications for this visit.     Past Medical History:  Diagnosis Date  . Arthritis   . Diabetes ([place])   . Dysphagia   . GERD (gastroesophageal reflux disease)   . Headache   . History of bronchitis   . Seasonal allergies   . Toenail fungus     Past Surgical History:  Procedure Laterality Date  . back surgeries x 4    . COLONOSCOPY N/A 11/05/2012   Procedure: COLONOSCOPY;  Surgeon: Rogene [place], MD;  [facility];  Service: Endoscopy;  Laterality: N/A;  1030  . ESOPHAGEAL DILATION N/A 07/06/2015   Procedure: ESOPHAGEAL DILATION;  Surgeon: Rogene [place], MD;  Location: [facility];  Service: Endoscopy;  Laterality: N/A;  . ESOPHAGOGASTRODUODENOSCOPY N/A 02/12/2014   Procedure: ESOPHAGOGASTRODUODENOSCOPY (EGD);  Surgeon: Rogene [place], MD;  [facility];  Service: Endoscopy;  Laterality: N/A;  150  . ESOPHAGOGASTRODUODENOSCOPY N/A 07/06/2015   Procedure: ESOPHAGOGASTRODUODENOSCOPY (EGD);  Surgeon: Rogene [place], MD;  [facility];  Service: Endoscopy;  Laterality: N/A;  1:25 - moved to 1/18 @ 10:30 - Ann to notify pt  . ESOPHAGOGASTRODUODENOSCOPY (EGD) WITH ESOPHAGEAL DILATION N/A 07/25/2012   Procedure: ESOPHAGOGASTRODUODENOSCOPY (EGD) WITH ESOPHAGEAL DILATION;  Surgeon: Rogene [place],  MD;  [facility];  Service: Endoscopy;  Laterality: N/A;  325-rescheduled to [place] notified pt  . Foot surgeries Bilateral    hammer toes  . MALONEY DILATION N/A 02/12/2014   Procedure: Venia Minks DILATION;  Surgeon: Rogene [place], MD;  Location: [facility];  Service: Endoscopy;  Laterality: N/A;  . Right knee arthroscopy     x2  . TOTAL ABDOMINAL HYSTERECTOMY      Social History   Social History  . Marital status: Divorced     Spouse name: N/A  . Number of children: N/A  . Years of education: N/A   Occupational History  . Not on file.   Social History Main Topics  . Smoking status: Never Smoker  . Smokeless tobacco: Never Used  . Alcohol use No     Comment: socially   . Drug use: No  . Sexual activity: Not Currently    Partners: Male    Birth control/ protection: None   Other Topics Concern  . Not on file   Social History Narrative  . No narrative on file      Prior to Admission medications   Medication Sig Start Date End Date Taking? Authorizing Provider  cetirizine (ZYRTEC) 10 MG tablet Take 10 mg by mouth daily as needed for allergies.    Yes Historical Provider, MD  cyclobenzaprine (FLEXERIL) 10 MG tablet Take 10 mg by mouth 3 (three) times daily as needed for muscle spasms.   Yes Historical Provider, MD  imipramine (TOFRANIL) 25 MG tablet Take 1 tablet (25 mg total) by mouth at bedtime. 08/16/15  Yes Rogene [place], MD  metFORMIN (GLUCOPHAGE) 500 MG tablet Take 500 mg by mouth 2 (two) times daily with a meal.   Yes Historical Provider, MD  metoprolol tartrate (LOPRESSOR) 25 MG tablet Take 1 tablet (25 mg total) by mouth 2 (two) times daily. 05/19/16  Yes Kathie Dike, MD  pantoprazole (PROTONIX) 40 MG tablet Take 40 mg by mouth daily.   Yes Historical Provider, MD  rivaroxaban (XARELTO) 20 MG TABS tablet Take 1 tablet (20 mg total) by mouth daily with supper. 05/19/16  Yes Kathie Dike, MD     Review of systems complete and found to be negative unless listed above in HPI     Physical exam Blood pressure 103/67, pulse 80, height 5\' 3"  (1.6 m), weight 141 lb (64 kg). General: NAD Neck: No JVD, no thyromegaly or thyroid nodule.  Lungs: Clear to auscultation bilaterally with normal respiratory effort. CV: Nondisplaced PMI. Regular rate and rhythm, normal S1/S2, no S3/S4, no murmur.  No peripheral edema.  No carotid bruit.  Normal pedal pulses.  Abdomen: Soft, nontender, no  hepatosplenomegaly, no distention.  Skin: Intact without lesions or rashes.  Neurologic: Alert and oriented x 3.  Psych: Normal affect. Extremities: No clubbing or cyanosis.  HEENT: Normal.   ECG: Most recent ECG reviewed.  Labs:   Lab Results  Component Value Date   WBC 8.5 05/18/2016   HGB 14.1 05/18/2016   HCT 41.7 05/18/2016   MCV 86.3 05/18/2016   PLT 315 05/18/2016    Recent Labs Lab 05/18/16 0747 05/19/16 0657  NA 139 140  K 3.4* 3.8  CL 107 111  CO2 26 25  BUN 9 9  CREATININE 0.69 0.60  CALCIUM 9.5 8.8*  PROT 6.9  --   BILITOT 0.3  --   ALKPHOS 58  --   ALT 23  --   AST 24  --  GLUCOSE 181* 161*   Lab Results  Component Value Date   TROPONINI <0.03 05/19/2016   No results found for: CHOL No results found for: HDL No results found for: LDLCALC No results found for: TRIG No results found for: CHOLHDL No results found for: LDLDIRECT       Studies: No results found.  ASSESSMENT AND PLAN:  1. Paroxysmal atrial fibrillation: Symptomatically stable on metoprolol. CHADSVASC score is 74 (age, gender, diabetes), thus thromboembolic risk is elevated. For this reason I will continue Xarelto.  Dispo: fu 3 months.   Signed: Kate Sable, M.D., F.A.C.C.  05/23/2016, 9:33 AM

## 2016-05-29 ENCOUNTER — Telehealth (INDEPENDENT_AMBULATORY_CARE_PROVIDER_SITE_OTHER): Payer: Self-pay | Admitting: Internal Medicine

## 2016-05-29 DIAGNOSIS — M1711 Unilateral primary osteoarthritis, right knee: Secondary | ICD-10-CM | POA: Diagnosis not present

## 2016-05-29 NOTE — Telephone Encounter (Signed)
Per Dr.Rehman this was not done last time due to the patient being in A-Fib. He says we can post it and it would be under Propofol.

## 2016-05-29 NOTE — Telephone Encounter (Signed)
Patient called, she'd like to know when Dr. Laural Golden would like to stretch her throat again.  [phone]

## 2016-05-30 ENCOUNTER — Telehealth (INDEPENDENT_AMBULATORY_CARE_PROVIDER_SITE_OTHER): Payer: Self-pay | Admitting: *Deleted

## 2016-05-30 ENCOUNTER — Other Ambulatory Visit (INDEPENDENT_AMBULATORY_CARE_PROVIDER_SITE_OTHER): Payer: Self-pay | Admitting: *Deleted

## 2016-05-30 ENCOUNTER — Other Ambulatory Visit (INDEPENDENT_AMBULATORY_CARE_PROVIDER_SITE_OTHER): Payer: Self-pay | Admitting: Internal Medicine

## 2016-05-30 DIAGNOSIS — R1319 Other dysphagia: Secondary | ICD-10-CM | POA: Insufficient documentation

## 2016-05-30 DIAGNOSIS — K220 Achalasia of cardia: Secondary | ICD-10-CM

## 2016-05-30 NOTE — Telephone Encounter (Signed)
That would be fine 

## 2016-05-30 NOTE — Telephone Encounter (Signed)
EGD w propofol sch'd 06/06/16, patient aware

## 2016-05-30 NOTE — Telephone Encounter (Signed)
See reply below.

## 2016-05-30 NOTE — Telephone Encounter (Signed)
Patient is scheduled for endoscopy with dilatation on 06/06/16 and needs to stop Xarelto 2 days before -- please advise if ok to stop, thanks

## 2016-05-31 NOTE — Telephone Encounter (Signed)
forwarded to Dr Laural Golden for review, patient aware that it is ok to hold Xarelto

## 2016-05-31 NOTE — Telephone Encounter (Signed)
noted 

## 2016-06-04 ENCOUNTER — Encounter (HOSPITAL_COMMUNITY): Payer: Self-pay

## 2016-06-04 ENCOUNTER — Encounter (HOSPITAL_COMMUNITY)
Admission: RE | Admit: 2016-06-04 | Discharge: 2016-06-04 | Disposition: A | Discharge status: Home / Self Care | Payer: Medicare Other | Source: Ambulatory Visit | Attending: Internal Medicine | Admitting: Internal Medicine

## 2016-06-05 DIAGNOSIS — M1711 Unilateral primary osteoarthritis, right knee: Secondary | ICD-10-CM | POA: Diagnosis not present

## 2016-06-06 ENCOUNTER — Ambulatory Visit (HOSPITAL_COMMUNITY)
Admission: RE | Admit: 2016-06-06 | Discharge: 2016-06-06 | Disposition: A | Discharge status: Home / Self Care | Payer: Medicare Other | Source: Ambulatory Visit | Attending: Internal Medicine | Admitting: Internal Medicine

## 2016-06-06 ENCOUNTER — Ambulatory Visit (HOSPITAL_COMMUNITY): Payer: Medicare Other | Admitting: Anesthesiology

## 2016-06-06 ENCOUNTER — Encounter (HOSPITAL_COMMUNITY)
Admission: RE | Disposition: A | Discharge status: Home / Self Care | Payer: Self-pay | Source: Ambulatory Visit | Attending: Internal Medicine

## 2016-06-06 ENCOUNTER — Encounter (HOSPITAL_COMMUNITY): Payer: Self-pay | Admitting: *Deleted

## 2016-06-06 DIAGNOSIS — K222 Esophageal obstruction: Secondary | ICD-10-CM | POA: Diagnosis not present

## 2016-06-06 DIAGNOSIS — Z79899 Other long term (current) drug therapy: Secondary | ICD-10-CM | POA: Diagnosis not present

## 2016-06-06 DIAGNOSIS — Z882 Allergy status to sulfonamides status: Secondary | ICD-10-CM | POA: Diagnosis not present

## 2016-06-06 DIAGNOSIS — K224 Dyskinesia of esophagus: Secondary | ICD-10-CM | POA: Diagnosis not present

## 2016-06-06 DIAGNOSIS — Z888 Allergy status to other drugs, medicaments and biological substances status: Secondary | ICD-10-CM | POA: Insufficient documentation

## 2016-06-06 DIAGNOSIS — R1310 Dysphagia, unspecified: Secondary | ICD-10-CM | POA: Diagnosis not present

## 2016-06-06 DIAGNOSIS — Z7984 Long term (current) use of oral hypoglycemic drugs: Secondary | ICD-10-CM | POA: Diagnosis not present

## 2016-06-06 DIAGNOSIS — K2210 Ulcer of esophagus without bleeding: Secondary | ICD-10-CM | POA: Insufficient documentation

## 2016-06-06 DIAGNOSIS — K219 Gastro-esophageal reflux disease without esophagitis: Secondary | ICD-10-CM | POA: Insufficient documentation

## 2016-06-06 DIAGNOSIS — Z823 Family history of stroke: Secondary | ICD-10-CM | POA: Insufficient documentation

## 2016-06-06 DIAGNOSIS — Z7901 Long term (current) use of anticoagulants: Secondary | ICD-10-CM | POA: Insufficient documentation

## 2016-06-06 DIAGNOSIS — M1990 Unspecified osteoarthritis, unspecified site: Secondary | ICD-10-CM | POA: Insufficient documentation

## 2016-06-06 DIAGNOSIS — Z833 Family history of diabetes mellitus: Secondary | ICD-10-CM | POA: Insufficient documentation

## 2016-06-06 DIAGNOSIS — R1319 Other dysphagia: Secondary | ICD-10-CM | POA: Insufficient documentation

## 2016-06-06 DIAGNOSIS — E119 Type 2 diabetes mellitus without complications: Secondary | ICD-10-CM | POA: Insufficient documentation

## 2016-06-06 DIAGNOSIS — K449 Diaphragmatic hernia without obstruction or gangrene: Secondary | ICD-10-CM | POA: Diagnosis not present

## 2016-06-06 DIAGNOSIS — R1314 Dysphagia, pharyngoesophageal phase: Secondary | ICD-10-CM | POA: Diagnosis not present

## 2016-06-06 DIAGNOSIS — I4891 Unspecified atrial fibrillation: Secondary | ICD-10-CM | POA: Diagnosis not present

## 2016-06-06 DIAGNOSIS — K220 Achalasia of cardia: Secondary | ICD-10-CM | POA: Diagnosis not present

## 2016-06-06 HISTORY — PX: ESOPHAGEAL DILATION: SHX303

## 2016-06-06 HISTORY — PX: BOTOX INJECTION: SHX5754

## 2016-06-06 HISTORY — DX: Cardiac arrhythmia, unspecified: I49.9

## 2016-06-06 HISTORY — PX: ESOPHAGOGASTRODUODENOSCOPY (EGD) WITH PROPOFOL: SHX5813

## 2016-06-06 LAB — GLUCOSE, CAPILLARY
Glucose-Capillary: 129 mg/dL — ABNORMAL HIGH (ref 65–99)
Glucose-Capillary: 102 mg/dL — ABNORMAL HIGH (ref 65–99)

## 2016-06-06 SURGERY — ESOPHAGOGASTRODUODENOSCOPY (EGD) WITH PROPOFOL
Anesthesia: Monitor Anesthesia Care

## 2016-06-06 MED ORDER — PROPOFOL 10 MG/ML IV BOLUS
INTRAVENOUS | Status: AC
  Filled 2016-06-06: qty 20

## 2016-06-06 MED ORDER — MIDAZOLAM HCL 2 MG/2ML IJ SOLN
INTRAMUSCULAR | Status: AC
  Filled 2016-06-06: qty 2

## 2016-06-06 MED ORDER — PROPOFOL 500 MG/50ML IV EMUL
INTRAVENOUS | Status: DC | PRN
  Administered 2016-06-06: 100 ug/kg/min via INTRAVENOUS

## 2016-06-06 MED ORDER — BUTAMBEN-TETRACAINE-BENZOCAINE 2-2-14 % EX AERO
1.0000 | INHALATION_SPRAY | Freq: Once | CUTANEOUS | Status: AC
  Administered 2016-06-06: 1 via TOPICAL

## 2016-06-06 MED ORDER — MIDAZOLAM HCL 5 MG/5ML IJ SOLN
INTRAMUSCULAR | Status: DC | PRN
  Administered 2016-06-06: 2 mg via INTRAVENOUS

## 2016-06-06 MED ORDER — CHLORHEXIDINE GLUCONATE CLOTH 2 % EX PADS: 6.0000 | MEDICATED_PAD | Freq: Once | CUTANEOUS | Status: DC

## 2016-06-06 MED ORDER — FENTANYL CITRATE (PF) 100 MCG/2ML IJ SOLN
25.0000 ug | Freq: Once | INTRAMUSCULAR | Status: AC
  Administered 2016-06-06: 25 ug via INTRAVENOUS

## 2016-06-06 MED ORDER — SODIUM CHLORIDE 0.9 % IJ SOLN
100.0000 [IU] | Freq: Once | INTRAMUSCULAR | Status: AC
  Administered 2016-06-06: 4 mL via SUBMUCOSAL
  Filled 2016-06-06: qty 100

## 2016-06-06 MED ORDER — MIDAZOLAM HCL 2 MG/2ML IJ SOLN
1.0000 mg | INTRAMUSCULAR | Status: DC | PRN
  Administered 2016-06-06 (×2): 2 mg via INTRAVENOUS

## 2016-06-06 MED ORDER — LACTATED RINGERS IV SOLN
INTRAVENOUS | Status: DC
  Administered 2016-06-06: 09:00:00 via INTRAVENOUS

## 2016-06-06 MED ORDER — FENTANYL CITRATE (PF) 100 MCG/2ML IJ SOLN
INTRAMUSCULAR | Status: AC
  Filled 2016-06-06: qty 2

## 2016-06-06 NOTE — Op Note (Addendum)
[facility] Patient Name: Katrina Castro Procedure Date: 06/06/2016 10:56 AM MRN: [id number] Date of Birth: 11-01-1945 Attending MD: Hildred Laser , MD CSN: [id number] Age: 70 Admit Type: Outpatient Procedure:                Upper GI endoscopy Indications:              Esophageal dysphagia. abnormal esophageal manometry                            suggesting type III achalasia. Providers:                Hildred Laser, MD, Charlyne Petrin RN, RN, Isabella Stalling, Technician Referring MD:             Glenda Chroman, MD Medicines:                Cetacaine spray, Propofol per Anesthesia Complications:            No immediate complications. Estimated Blood Loss:     Estimated blood loss was minimal. Procedure:                Pre-Anesthesia Assessment:                           - Prior to the procedure, a History and Physical                            was performed, and patient medications and                            allergies were reviewed. The patient's tolerance of                            previous anesthesia was also reviewed. The risks                            and benefits of the procedure and the sedation                            options and risks were discussed with the patient.                            All questions were answered, and informed consent                            was obtained. Prior Anticoagulants: The patient                            last took Xarelto (rivaroxaban) 3 days prior to the                            procedure. ASA Grade Assessment: II - A patient  with mild systemic disease. After reviewing the                            risks and benefits, the patient was deemed in                            satisfactory condition to undergo the procedure.                           After obtaining informed consent, the endoscope was                            passed under direct vision. Throughout the                             procedure, the patient's blood pressure, pulse, and                            oxygen saturations were monitored continuously. The                            [id number]) was introduced through the                            mouth, and advanced to the second part of duodenum.                            The upper GI endoscopy was accomplished without                            difficulty. The patient tolerated the procedure                            well. Scope In: 11:03:37 AM Scope Out: 11:16:58 AM Scope Withdrawal Time: 0 hours 0 minutes 1 second  Total Procedure Duration: 0 hours 13 minutes 21 seconds  Findings:      The examined esophagus was normal.      A single non-bleeding erosion was found at the gastroesophageal junction.      A low-grade of narrowing Schatzki ring (acquired) was found at the       gastroesophageal junction. The scope was withdrawn. Dilation was       performed with a Maloney dilator with moderate resistance at 56 Fr. The       dilation site was examined following endoscope reinsertion and showed       {skip}ring noted to have been disrupted. Estimated blood loss was       minimal.      Abnormal motility was noted in the distal esophagus. There is spasticity       of the esophageal body. The distal esophagus/lower esophageal sphincter       is spastic, but gives up passage to the endoscope. Area was successfully       injected with botulinum toxin.      A 3 cm hiatal hernia was present.      The entire examined stomach was normal.  The duodenal bulb and second portion of the duodenum were normal. Impression:               - Normal esophagus.                           - A single non-bleeding erosion at the                            gastroesophageal junction.                           - Low-grade of narrowing Schatzki ring. Dilated.                           - Abnormal esophageal motility/spastic distal                             segment. Injected with botulinum toxin. 25 units x                            4.                           - 3 cm hiatal hernia. GE junction at 33 and hiatus                            at 36.                           - Normal stomach.                           - Normal duodenal bulb and second portion of the                            duodenum.                           - No specimens collected. Moderate Sedation:      Per Anesthesia Care Recommendation:           - Patient has a contact number available for                            emergencies. The signs and symptoms of potential                            delayed complications were discussed with the                            patient. Return to normal activities tomorrow.                            Written discharge instructions were provided to the                            patient.                           -  Resume previous diet today.                           - Continue present medications.                           - Resume Xarelto (rivaroxaban) at prior dose                            tomorrow.                           - Telephone GI clinic in 1 week. Procedure Code(s):        --- Professional ---                           [phone], Esophagogastroduodenoscopy, flexible,                            transoral; with directed submucosal injection(s),                            any substance                           [id number], Dilation of esophagus, by unguided sound or                            bougie, single or multiple passes Diagnosis Code(s):        --- Professional ---                           K22.10, Ulcer of esophagus without bleeding                           K22.2, Esophageal obstruction                           K22.4, Dyskinesia of esophagus                           K44.9, Diaphragmatic hernia without obstruction or                            gangrene                           R13.14, Dysphagia,  pharyngoesophageal phase CPT copyright 2016 American Medical Association. All rights reserved. The codes documented in this report are preliminary and upon coder review may  be revised to meet current compliance requirements. Hildred Laser, MD Hildred Laser, MD 06/06/2016 11:42:43 AM This report has been signed electronically. Number of Addenda: 0

## 2016-06-06 NOTE — Transfer of Care (Signed)
Immediate Anesthesia Transfer of Care Note  Patient: Katrina Castro  Procedure(s) Performed: Procedure(s): ESOPHAGOGASTRODUODENOSCOPY (EGD) WITH PROPOFOL (N/A) ESOPHAGEAL DILATION (N/A) BOTOX INJECTION (N/A)  Patient Location: PACU  Anesthesia Type:MAC  Level of Consciousness: sedated  Airway & Oxygen Therapy: Patient Spontanous Breathing and Patient connected to face mask oxygen  Post-op Assessment: Report given to RN and Post -op Vital signs reviewed and stable  Post vital signs: Reviewed and stable  Last Vitals:  Vitals:   06/06/16 1045 06/06/16 1050  BP: 101/67   Pulse:    Resp: 19 15  Temp:      Last Pain:  Vitals:   06/06/16 1056  TempSrc:   PainSc: 5       Patients Stated Pain Goal: 2 ([id number] [id number])  Complications: No apparent anesthesia complications

## 2016-06-06 NOTE — Discharge Instructions (Signed)
Resume Xarelto tomorrow evening. Resume other medications and diet as before. Remember to eat slowly and chew your food thoroughly before swallowing. No driving for 24 hours. Keep symptom diary for 1 week and call office with progress report.  Esophagogastroduodenoscopy, Care After Introduction Refer to this sheet in the next few weeks. These instructions provide you with information about caring for yourself after your procedure. Your health care provider may also give you more specific instructions. Your treatment has been planned according to current medical practices, but problems sometimes occur. Call your health care provider if you have any problems or questions after your procedure. What can I expect after the procedure? After the procedure, it is common to have:  A sore throat.  Nausea.  Bloating.  Dizziness.  Fatigue. Follow these instructions at home:  Do not eat or drink anything until the numbing medicine (local anesthetic) has worn off and your gag reflex has returned. You will know that the local anesthetic has worn off when you can swallow comfortably.  Do not drive for 24 hours if you received a medicine to help you relax (sedative).  If your health care provider took a tissue sample for testing during the procedure, make sure to get your test results. This is your responsibility. Ask your health care provider or the department performing the test when your results will be ready.  Keep all follow-up visits as told by your health care provider. This is important. Contact a health care provider if:  You cannot stop coughing.  You are not urinating.  You are urinating less than usual. Get help right away if:  You have trouble swallowing.  You cannot eat or drink.  You have throat or chest pain that gets worse.  You are dizzy or light-headed.  You faint.  You have nausea or vomiting.  You have chills.  You have a fever.  You have severe abdominal  pain.  You have black, tarry, or bloody stools. This information is not intended to replace advice given to you by your health care provider. Make sure you discuss any questions you have with your health care provider. Document Released: 05/21/2012 Document Revised: 11/10/2015 Document Reviewed: 04/28/2015  2017 Elsevier   Esophageal Dilatation Esophageal dilatation is a procedure to open a blocked or narrowed part of the esophagus. The esophagus is the long tube in your throat that carries food and liquid from your mouth to your stomach. The procedure is also called esophageal dilation. You may need this procedure if you have a buildup of scar tissue in your esophagus that makes it difficult, painful, or even impossible to swallow. This can be caused by gastroesophageal reflux disease (GERD). In rare cases, people need this procedure because they have cancer of the esophagus or a problem with the way food moves through the esophagus. Sometimes you may need to have another dilatation to enlarge the opening of the esophagus gradually. Tell a health care provider about:  Any allergies you have.  All medicines you are taking, including vitamins, herbs, eye drops, creams, and over-the-counter medicines.  Any problems you or family members have had with anesthetic medicines.  Any blood disorders you have.  Any surgeries you have had.  Any medical conditions you have.  Any antibiotic medicines you are required to take before dental procedures. What are the risks? Generally, this is a safe procedure. However, problems can occur and include:  Bleeding from a tear in the lining of the esophagus.  A hole (perforation)  in the esophagus. What happens before the procedure?  Do not eat or drink anything after midnight on the night before the procedure or as directed by your health care provider.  Ask your health care provider about changing or stopping your regular medicines. This is  especially important if you are taking diabetes medicines or blood thinners.  Plan to have someone take you home after the procedure. What happens during the procedure?  You will be given a medicine that makes you relaxed and sleepy (sedative).  A medicine may be sprayed or gargled to numb the back of the throat.  Your health care provider can use various instruments to do an esophageal dilatation. During the procedure, the instrument used will be placed in your mouth and passed down into your esophagus. Options include:  Simple dilators. This instrument is carefully placed in the esophagus to stretch it.  Guided wire bougies. In this method, a flexible tube (endoscope) is used to insert a wire into the esophagus. The dilator is passed over this wire to enlarge the esophagus. Then the wire is removed.  Balloon dilators. An endoscope with a small balloon at the end is passed down into the esophagus. Inflating the balloon gently stretches the esophagus and opens it up. What happens after the procedure?  Your blood pressure, heart rate, breathing rate, and blood oxygen level will be monitored often until the medicines you were given have worn off.  Your throat may feel slightly sore and will probably still feel numb. This will improve slowly over time.  You will not be allowed to eat or drink until the throat numbness has resolved.  If this is a same-day procedure, you may be allowed to go home once you have been able to drink, urinate, and sit on the edge of the bed without nausea or dizziness.  If this is a same-day procedure, you should have a friend or family member with you for the next 24 hours after the procedure. This information is not intended to replace advice given to you by your health care provider. Make sure you discuss any questions you have with your health care provider. Document Released: 07/26/2005 Document Revised: 11/10/2015 Document Reviewed: 10/14/2013 Elsevier  Interactive Patient Education  2017 [employer].

## 2016-06-06 NOTE — Anesthesia Preprocedure Evaluation (Signed)
Anesthesia Evaluation  Patient identified by MRN, date of birth, ID band Patient awake    Reviewed: Allergy & Precautions, NPO status , Patient's Chart, lab work & pertinent test results  Airway Mallampati: III  TM Distance: >3 FB     Dental  (+) Implants   Pulmonary neg pulmonary ROS,    breath sounds clear to auscultation       Cardiovascular + dysrhythmias Atrial Fibrillation  Rhythm:Regular Rate:Normal     Neuro/Psych  Headaches,    GI/Hepatic GERD  Controlled,  Endo/Other  diabetes, Type 2  Renal/GU      Musculoskeletal   Abdominal   Peds  Hematology   Anesthesia Other Findings   Reproductive/Obstetrics                             Anesthesia Physical Anesthesia Plan  ASA: III  Anesthesia Plan: MAC   Post-op Pain Management:    Induction: Intravenous  Airway Management Planned: Simple Face Mask  Additional Equipment:   Intra-op Plan:   Post-operative Plan:   Informed Consent: I have reviewed the patients History and Physical, chart, labs and discussed the procedure including the risks, benefits and alternatives for the proposed anesthesia with the patient or authorized representative who has indicated his/her understanding and acceptance.     Plan Discussed with:   Anesthesia Plan Comments:         Anesthesia Quick Evaluation

## 2016-06-06 NOTE — H&P (Signed)
Katrina Castro is an 70 y.o. female.   Chief Complaint: Patient is here for EGD possible Botox therapy and esophageal dilation. HPI: Patient is 69 year old Caucasian female who has chronic GERD as well as esophageal motility disorder. She had esophageal manometry at [facility] earlier this year revealing elevated LES pressure with incomplete relaxation and and strong peristaltic on traction of esophageal body. She has been intolerant of diltiazem. She has undergone esophageal dilation in the past and has provided temporary relief of her dysphagia. She has difficulty with liquids as well as solids. She was here 3 weeks ago and noted to be in A. fib. Therefore procedure was postponed. She is back in sinus rhythm. She has been off anticoagulants for 3 days.  Past Medical History:  Diagnosis Date  . Arthritis   . Diabetes ([place])   . Dysphagia   . Dysrhythmia    afib  . GERD (gastroesophageal reflux disease)   . Headache   . History of bronchitis   . Seasonal allergies   . Toenail fungus     Past Surgical History:  Procedure Laterality Date  . back surgeries x 4    . COLONOSCOPY N/A 11/05/2012   Procedure: COLONOSCOPY;  Surgeon: Rogene Houston, MD;  Location: [facility];  Service: Endoscopy;  Laterality: N/A;  1030  . ESOPHAGEAL DILATION N/A 07/06/2015   Procedure: ESOPHAGEAL DILATION;  Surgeon: Rogene Houston, MD;  Location: [facility];  Service: Endoscopy;  Laterality: N/A;  . ESOPHAGOGASTRODUODENOSCOPY N/A 02/12/2014   Procedure: ESOPHAGOGASTRODUODENOSCOPY (EGD);  Surgeon: Rogene Houston, MD;  [facility]: [facility];  Service: Endoscopy;  Laterality: N/A;  150  . ESOPHAGOGASTRODUODENOSCOPY N/A 07/06/2015   Procedure: ESOPHAGOGASTRODUODENOSCOPY (EGD);  Surgeon: Rogene Houston, MD;  [facility]: [facility];  Service: Endoscopy;  Laterality: N/A;  1:25 - moved to 1/18 @ 10:30 - Ann to notify pt  . ESOPHAGOGASTRODUODENOSCOPY (EGD) WITH ESOPHAGEAL DILATION N/A 07/25/2012   Procedure:  ESOPHAGOGASTRODUODENOSCOPY (EGD) WITH ESOPHAGEAL DILATION;  Surgeon: Rogene Houston, MD;  [facility]: [facility];  Service: Endoscopy;  Laterality: N/A;  325-rescheduled to [place] notified pt  . Foot surgeries Bilateral    hammer toes  . MALONEY DILATION N/A 02/12/2014   Procedure: Venia Minks DILATION;  Surgeon: Rogene Houston, MD;  Location: [facility];  Service: Endoscopy;  Laterality: N/A;  . Right knee arthroscopy     x2  . TOTAL ABDOMINAL HYSTERECTOMY      Family History  Problem Relation Age of Onset  . Stroke Father   . Diabetes Mother   . Diabetes Sister   . Colon cancer Neg Hx    Social History:  reports that she has never smoked. She has never used smokeless tobacco. She reports that she does not drink alcohol or use drugs.  Allergies:  Allergies  Allergen Reactions  . Cardizem [Diltiazem] Other (See Comments)    Patient states that she could not think, felt that she was in the twilight zone. Arm discomfort.  . Other     Antibiotic that started with an "O"-caused anaphylaxis  . Sulfa Antibiotics Itching    Medications Prior to Admission  Medication Sig Dispense Refill  . acetaminophen (TYLENOL) 500 MG tablet Take 500 mg by mouth every 6 (six) hours as needed for headache.    . cetirizine (ZYRTEC) 10 MG tablet Take 10 mg by mouth daily.     . cyclobenzaprine (FLEXERIL) 10 MG tablet Take 10 mg by mouth at bedtime as needed  for muscle spasms.     Marland Kitchen imipramine (TOFRANIL) 25 MG tablet Take 1 tablet (25 mg total) by mouth at bedtime. 30 tablet 5  . metFORMIN (GLUCOPHAGE) 500 MG tablet Take 500 mg by mouth 2 (two) times daily with a meal.    . metoprolol tartrate (LOPRESSOR) 25 MG tablet Take 1 tablet (25 mg total) by mouth 2 (two) times daily. 60 tablet 0  . oxybutynin (DITROPAN-XL) 10 MG 24 hr tablet Take 10 mg by mouth daily.    . pantoprazole (PROTONIX) 40 MG tablet Take 40 mg by mouth at bedtime.     . rivaroxaban (XARELTO) 20 MG TABS tablet Take 1 tablet (20 mg  total) by mouth daily with supper. 30 tablet 1    Results for orders placed or performed during the hospital encounter of 06/06/16 (from the past 48 hour(s))  Glucose, capillary     Status: Abnormal   Collection Time: 06/06/16  8:13 AM  Result Value Ref Range   Glucose-Capillary 129 (H) 65 - 99 mg/dL   No results found.  ROS  Blood pressure 118/74, pulse 81, temperature 98.1 F (36.7 C), temperature source Oral, resp. rate 15, height 5\' 3"  (1.6 m), weight 141 lb (64 kg), SpO2 97 %. Physical Exam  Constitutional: She appears well-developed and well-nourished.  HENT:  Mouth/Throat: Oropharynx is clear and moist.  Eyes: Conjunctivae are normal. No scleral icterus.  Neck: No thyromegaly present.  Cardiovascular: Normal rate, regular rhythm and normal heart sounds.   No murmur heard. Respiratory: Effort normal and breath sounds normal.  GI: Soft. She exhibits no distension and no mass. There is no tenderness.  Musculoskeletal: She exhibits no edema.  Lymphadenopathy:    She has no cervical adenopathy.  Neurological: She is alert.  Skin: Skin is warm.     Assessment/Plan Dysphagia to solids and liquids. Abnormal manometry with hypotensive LES with incomplete relaxation and strong peristaltic contractions. EGD with possible Botox injection and possible EGD under MAC.   Hildred Laser, MD 06/06/2016, 9:21 AM

## 2016-06-06 NOTE — Anesthesia Postprocedure Evaluation (Signed)
Anesthesia Post Note  Patient: Katrina Castro  Procedure(s) Performed: Procedure(s) (LRB): ESOPHAGOGASTRODUODENOSCOPY (EGD) WITH PROPOFOL (N/A) ESOPHAGEAL DILATION (N/A) BOTOX INJECTION (N/A)  Patient location during evaluation: PACU Anesthesia Type: MAC Level of consciousness: awake, patient cooperative and oriented Pain management: pain level controlled Vital Signs Assessment: post-procedure vital signs reviewed and stable Respiratory status: spontaneous breathing, nonlabored ventilation and respiratory function stable Cardiovascular status: blood pressure returned to baseline Postop Assessment: no signs of nausea or vomiting Anesthetic complications: no     Last Vitals:  Vitals:   06/06/16 1045 06/06/16 1050  BP: 101/67   Pulse:    Resp: 19 15  Temp:      Last Pain:  Vitals:   06/06/16 1056  TempSrc:   PainSc: 5                  [name] J

## 2016-06-12 ENCOUNTER — Telehealth: Payer: Self-pay | Admitting: Neurology

## 2016-06-12 ENCOUNTER — Encounter (HOSPITAL_COMMUNITY): Payer: Self-pay | Admitting: Internal Medicine

## 2016-06-12 DIAGNOSIS — E1165 Type 2 diabetes mellitus with hyperglycemia: Secondary | ICD-10-CM | POA: Diagnosis not present

## 2016-06-12 DIAGNOSIS — Z299 Encounter for prophylactic measures, unspecified: Secondary | ICD-10-CM | POA: Diagnosis not present

## 2016-06-12 NOTE — Telephone Encounter (Signed)
Patient left a voice mail and I could not make out the telephone number to call her back did not say what she needed or wanted on the message please cal her back

## 2016-06-12 NOTE — Telephone Encounter (Signed)
Left message on machine for patient to call back.

## 2016-06-13 ENCOUNTER — Telehealth: Payer: Self-pay | Admitting: Neurology

## 2016-06-13 ENCOUNTER — Encounter: Payer: Self-pay | Admitting: Neurology

## 2016-06-13 ENCOUNTER — Ambulatory Visit (INDEPENDENT_AMBULATORY_CARE_PROVIDER_SITE_OTHER): Payer: Medicare Other | Admitting: Neurology

## 2016-06-13 VITALS — BP 104/60 | HR 78 | Ht 63.0 in | Wt 140.0 lb

## 2016-06-13 DIAGNOSIS — G43009 Migraine without aura, not intractable, without status migrainosus: Secondary | ICD-10-CM | POA: Diagnosis not present

## 2016-06-13 DIAGNOSIS — M792 Neuralgia and neuritis, unspecified: Secondary | ICD-10-CM | POA: Diagnosis not present

## 2016-06-13 NOTE — Patient Instructions (Signed)
1.  If the burning on the head gets worse, try taking gabapentin 300mg  every night 2.  Follow up as needed.

## 2016-06-13 NOTE — Telephone Encounter (Signed)
Patient needs a letter stating that she needs to return as needed please call patient at [phone]
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
## 2016-06-13 NOTE — Progress Notes (Signed)
Note sent to Dr. Woody Seller.

## 2016-06-13 NOTE — Telephone Encounter (Signed)
Spoke with patient and made her aware her AVS states to only follow up if needed. She stated that was okay and does not need a letter.

## 2016-06-14 DIAGNOSIS — M1711 Unilateral primary osteoarthritis, right knee: Secondary | ICD-10-CM | POA: Diagnosis not present

## 2016-06-20 ENCOUNTER — Ambulatory Visit: Payer: Medicare Other | Admitting: Cardiovascular Disease

## 2016-06-20 DIAGNOSIS — Z789 Other specified health status: Secondary | ICD-10-CM | POA: Diagnosis not present

## 2016-06-20 DIAGNOSIS — Z299 Encounter for prophylactic measures, unspecified: Secondary | ICD-10-CM | POA: Diagnosis not present

## 2016-06-20 DIAGNOSIS — Z6826 Body mass index (BMI) 26.0-26.9, adult: Secondary | ICD-10-CM | POA: Diagnosis not present

## 2016-06-20 DIAGNOSIS — M25561 Pain in right knee: Secondary | ICD-10-CM | POA: Diagnosis not present

## 2016-06-21 DIAGNOSIS — I4891 Unspecified atrial fibrillation: Secondary | ICD-10-CM | POA: Diagnosis not present

## 2016-06-21 DIAGNOSIS — F419 Anxiety disorder, unspecified: Secondary | ICD-10-CM | POA: Diagnosis not present

## 2016-06-22 DIAGNOSIS — I4891 Unspecified atrial fibrillation: Secondary | ICD-10-CM | POA: Diagnosis not present

## 2016-06-22 DIAGNOSIS — F419 Anxiety disorder, unspecified: Secondary | ICD-10-CM | POA: Diagnosis not present

## 2016-06-28 ENCOUNTER — Other Ambulatory Visit: Payer: Self-pay | Admitting: *Deleted

## 2016-06-28 DIAGNOSIS — I10 Essential (primary) hypertension: Secondary | ICD-10-CM | POA: Diagnosis not present

## 2016-06-28 DIAGNOSIS — Z299 Encounter for prophylactic measures, unspecified: Secondary | ICD-10-CM | POA: Diagnosis not present

## 2016-06-28 DIAGNOSIS — F419 Anxiety disorder, unspecified: Secondary | ICD-10-CM | POA: Diagnosis not present

## 2016-06-28 DIAGNOSIS — I4891 Unspecified atrial fibrillation: Secondary | ICD-10-CM | POA: Diagnosis not present

## 2016-06-28 MED ORDER — RIVAROXABAN 20 MG PO TABS: 20.0000 mg | ORAL_TABLET | Freq: Every day | ORAL | 1 refills | Status: DC

## 2016-06-28 NOTE — Telephone Encounter (Signed)
Mailed out pt assistance form for xarelto per pt request. Says she took last pill last night - refills sent to [facility] today - pt will let us know if she can't afford Xarelto.

## 2016-06-29 ENCOUNTER — Telehealth: Payer: Self-pay | Admitting: Cardiovascular Disease

## 2016-06-29 ENCOUNTER — Other Ambulatory Visit: Payer: Self-pay | Admitting: *Deleted

## 2016-06-29 MED ORDER — RIVAROXABAN 20 MG PO TABS: 20.0000 mg | ORAL_TABLET | Freq: Every day | ORAL | 0 refills | Status: DC

## 2016-06-29 NOTE — Telephone Encounter (Signed)
Patient called to say that her xarelto is $400 and she can't afford it. Nurse advised patient that we will give her 3 weeks of samples and that she needed to contact [facility] to have them fax our office with the issue r/e xarelto, also she needed to contact her drug plan with her insurance to find out which medication like xarelto they would cover. Patient verbalized understanding of plan.

## 2016-06-29 NOTE — Telephone Encounter (Signed)
LM for pt to returncall

## 2016-06-29 NOTE — Telephone Encounter (Signed)
Can she take the 15 mg instead of 20 mg    Dr. Woody Seller gave her 15 mg?

## 2016-07-02 ENCOUNTER — Telehealth: Payer: Self-pay | Admitting: Cardiovascular Disease

## 2016-07-02 ENCOUNTER — Ambulatory Visit (INDEPENDENT_AMBULATORY_CARE_PROVIDER_SITE_OTHER): Payer: Medicare Other | Admitting: Internal Medicine

## 2016-07-02 ENCOUNTER — Encounter (INDEPENDENT_AMBULATORY_CARE_PROVIDER_SITE_OTHER): Payer: Self-pay | Admitting: Internal Medicine

## 2016-07-02 VITALS — BP 120/82 | HR 64 | Temp 98.1°F | Ht 63.0 in | Wt 142.0 lb

## 2016-07-02 DIAGNOSIS — R1319 Other dysphagia: Secondary | ICD-10-CM

## 2016-07-02 NOTE — Progress Notes (Signed)
Subjective:    Patient ID: Katrina Castro, female    DOB: [birth year], 71 y.o.   MRN: [id number]  HPI Here today for f/u. She says her swallowing is good. She did have some trouble eating roast beef.  She has maintained her weight. She can eat about what she wants to. BMs are normal. EGD in December (see below).   06/06/2016 EGD/ED Indication: Esophageal dysphagia, abnormal esophageal  Monometry suggesting type 111 achalasia. Impression:               - Normal esophagus.                           - A single non-bleeding erosion at the                            gastroesophageal junction.                           - Low-grade of narrowing Schatzki ring. Dilated.                           - Abnormal esophageal motility/spastic distal                            segment. Injected with botulinum toxin. 25 units x                            4.                           - 3 cm hiatal hernia. GE junction at 33 and hiatus                            at 36.                           - Normal stomach.                           - Normal duodenal bulb and second portion of the                            duodenum.                           - No specimens collected.   07/06/2015 EGD/ED. Dysphagia. Dr. Laural Golden Impression: Schatzki's ring and a small sliding hiatal hernia. No evidence of erosive esophagitis or peptic ulcer disease. Schatzki's ring disrupted by passing 56 [place] Maloney dilator.   Review of Systems   Past Medical History:  Diagnosis Date  . Arthritis   . Diabetes ([place])   . Dysphagia   . Dysrhythmia    afib  . GERD (gastroesophageal reflux disease)   . Headache   . History of bronchitis   . Seasonal allergies   . Toenail fungus     Past Surgical History:  Procedure Laterality Date  . back surgeries x 4    . BOTOX INJECTION N/A 06/06/2016   Procedure: BOTOX INJECTION;  Surgeon: Bernadene Person  Gloriann Loan, MD;  [facility];  Service: Endoscopy;  Laterality:  N/A;  . COLONOSCOPY N/A 11/05/2012   Procedure: COLONOSCOPY;  Surgeon: Rogene Houston, MD;  [facility];  Service: Endoscopy;  Laterality: N/A;  1030  . ESOPHAGEAL DILATION N/A 07/06/2015   Procedure: ESOPHAGEAL DILATION;  Surgeon: Rogene Houston, MD;  [facility];  Service: Endoscopy;  Laterality: N/A;  . ESOPHAGEAL DILATION N/A 06/06/2016   Procedure: ESOPHAGEAL DILATION;  Surgeon: Rogene Houston, MD;  [facility];  Service: Endoscopy;  Laterality: N/A;  . ESOPHAGOGASTRODUODENOSCOPY N/A 02/12/2014   Procedure: ESOPHAGOGASTRODUODENOSCOPY (EGD);  Surgeon: Rogene Houston, MD;  [facility];  Service: Endoscopy;  Laterality: N/A;  150  . ESOPHAGOGASTRODUODENOSCOPY N/A 07/06/2015   Procedure: ESOPHAGOGASTRODUODENOSCOPY (EGD);  Surgeon: Rogene Houston, MD;  [facility];  Service: Endoscopy;  Laterality: N/A;  1:25 - moved to 1/18 @ 10:30 - Ann to notify pt  . ESOPHAGOGASTRODUODENOSCOPY (EGD) WITH ESOPHAGEAL DILATION N/A 07/25/2012   Procedure: ESOPHAGOGASTRODUODENOSCOPY (EGD) WITH ESOPHAGEAL DILATION;  Surgeon: Rogene Houston, MD;  [facility];  Service: Endoscopy;  Laterality: N/A;  325-rescheduled to [place] notified pt  . ESOPHAGOGASTRODUODENOSCOPY (EGD) WITH PROPOFOL N/A 06/06/2016   Procedure: ESOPHAGOGASTRODUODENOSCOPY (EGD) WITH PROPOFOL;  Surgeon: Rogene Houston, MD;  [facility];  Service: Endoscopy;  Laterality: N/A;  . Foot surgeries Bilateral    hammer toes  . MALONEY DILATION N/A 02/12/2014   Procedure: Venia Minks DILATION;  Surgeon: Rogene Houston, MD;  [facility];  Service: Endoscopy;  Laterality: N/A;  . Right knee arthroscopy     x2  . TOTAL ABDOMINAL HYSTERECTOMY      Allergies  Allergen Reactions  . Cardizem [Diltiazem] Other (See Comments)    Patient states that she could not think, felt that she was in the twilight zone. Arm discomfort.  . Other     Antibiotic that started with  an "O"-caused anaphylaxis  . Sulfa Antibiotics Itching    Current Outpatient Prescriptions on File Prior to Visit  Medication Sig Dispense Refill  . acetaminophen (TYLENOL) 500 MG tablet Take 500 mg by mouth every 6 (six) hours as needed for headache.    . cetirizine (ZYRTEC) 10 MG tablet Take 10 mg by mouth daily.     Marland Kitchen imipramine (TOFRANIL) 25 MG tablet Take 1 tablet (25 mg total) by mouth at bedtime. 30 tablet 5  . metFORMIN (GLUCOPHAGE) 500 MG tablet Take 500 mg by mouth 2 (two) times daily with a meal.    . metoprolol tartrate (LOPRESSOR) 25 MG tablet Take 1 tablet (25 mg total) by mouth 2 (two) times daily. 60 tablet 0  . oxybutynin (DITROPAN-XL) 10 MG 24 hr tablet Take 10 mg by mouth daily.    . pantoprazole (PROTONIX) 40 MG tablet Take 40 mg by mouth at bedtime.     . rivaroxaban (XARELTO) 20 MG TABS tablet Take 1 tablet (20 mg total) by mouth daily with supper. 21 tablet 0   No current facility-administered medications on file prior to visit.        Objective:   Physical Exam Blood pressure 120/82, pulse 64, temperature 98.1 F (36.7 C), height 5\' 3"  (1.6 m), weight 142 lb (64.4 kg). Alert and oriented. Skin warm and dry. Oral mucosa is moist.   . Sclera anicteric, conjunctivae is pink. Thyroid not enlarged. No cervical lymphadenopathy. Lungs clear. Heart regular rate and rhythm.  Abdomen is soft. Bowel sounds are positive. No hepatomegaly. No abdominal masses felt. No tenderness.  No edema to lower extremities.         Assessment & Plan:  Dysphagia. She is doing well. She is careful what she eats. OV in 6 months.

## 2016-07-02 NOTE — Patient Instructions (Signed)
Continue the Protonix.  OV in 6 months 

## 2016-07-02 NOTE — Telephone Encounter (Signed)
Pt aware not advise to take 15 mg vs 20 mg of Xarelto - pt picked up samples at [facility] office and will contact us again before she runs out - is requesting samples through Feb then says she could afford to prick up rx

## 2016-07-02 NOTE — Telephone Encounter (Signed)
Patient was given samples of Xarelto 20mg . She is asking if she can take 15mg  after she runs out of them. Also asking if we have more samples. / tg

## 2016-07-03 NOTE — Telephone Encounter (Signed)
Called patient. No answer. Left message to call back.  

## 2016-07-18 ENCOUNTER — Other Ambulatory Visit: Payer: Self-pay | Admitting: *Deleted

## 2016-07-18 MED ORDER — RIVAROXABAN 20 MG PO TABS: 20.0000 mg | ORAL_TABLET | Freq: Every day | ORAL | 0 refills | Status: DC

## 2016-07-20 DIAGNOSIS — M1711 Unilateral primary osteoarthritis, right knee: Secondary | ICD-10-CM | POA: Diagnosis not present

## 2016-07-30 DIAGNOSIS — R5383 Other fatigue: Secondary | ICD-10-CM | POA: Diagnosis not present

## 2016-07-30 DIAGNOSIS — Z1389 Encounter for screening for other disorder: Secondary | ICD-10-CM | POA: Diagnosis not present

## 2016-07-30 DIAGNOSIS — Z7189 Other specified counseling: Secondary | ICD-10-CM | POA: Diagnosis not present

## 2016-07-30 DIAGNOSIS — E7800 Pure hypercholesterolemia, unspecified: Secondary | ICD-10-CM | POA: Diagnosis not present

## 2016-07-30 DIAGNOSIS — M810 Age-related osteoporosis without current pathological fracture: Secondary | ICD-10-CM | POA: Diagnosis not present

## 2016-07-30 DIAGNOSIS — Z0000 Encounter for general adult medical examination without abnormal findings: Secondary | ICD-10-CM | POA: Diagnosis not present

## 2016-07-30 DIAGNOSIS — E663 Overweight: Secondary | ICD-10-CM | POA: Diagnosis not present

## 2016-07-30 DIAGNOSIS — I10 Essential (primary) hypertension: Secondary | ICD-10-CM | POA: Diagnosis not present

## 2016-07-30 DIAGNOSIS — E1165 Type 2 diabetes mellitus with hyperglycemia: Secondary | ICD-10-CM | POA: Diagnosis not present

## 2016-07-30 DIAGNOSIS — Z1211 Encounter for screening for malignant neoplasm of colon: Secondary | ICD-10-CM | POA: Diagnosis not present

## 2016-07-30 DIAGNOSIS — Z299 Encounter for prophylactic measures, unspecified: Secondary | ICD-10-CM | POA: Diagnosis not present

## 2016-07-30 DIAGNOSIS — Z79899 Other long term (current) drug therapy: Secondary | ICD-10-CM | POA: Diagnosis not present

## 2016-07-31 DIAGNOSIS — Z79899 Other long term (current) drug therapy: Secondary | ICD-10-CM | POA: Diagnosis not present

## 2016-07-31 DIAGNOSIS — E7800 Pure hypercholesterolemia, unspecified: Secondary | ICD-10-CM | POA: Diagnosis not present

## 2016-08-02 ENCOUNTER — Telehealth: Payer: Self-pay | Admitting: Cardiovascular Disease

## 2016-08-02 MED ORDER — RIVAROXABAN 20 MG PO TABS: 20.0000 mg | ORAL_TABLET | Freq: Every day | ORAL | 3 refills | Status: DC

## 2016-08-02 NOTE — Telephone Encounter (Signed)
Pt has called multiple times and left multiple messages concerning her medication she cannot afford. Would like to know if she can have some samples till she's able to get the $500 to pay for her Rx. Please give her a call @ [phone]

## 2016-08-02 NOTE — Telephone Encounter (Signed)
2 Samples bottles of Xarelto 20 mg at front desk for pick up with [facility] saving program card.  Patient notified.

## 2016-08-06 DIAGNOSIS — Z1231 Encounter for screening mammogram for malignant neoplasm of breast: Secondary | ICD-10-CM | POA: Diagnosis not present

## 2016-08-10 DIAGNOSIS — Z713 Dietary counseling and surveillance: Secondary | ICD-10-CM | POA: Diagnosis not present

## 2016-08-10 DIAGNOSIS — Z6826 Body mass index (BMI) 26.0-26.9, adult: Secondary | ICD-10-CM | POA: Diagnosis not present

## 2016-08-10 DIAGNOSIS — F419 Anxiety disorder, unspecified: Secondary | ICD-10-CM | POA: Diagnosis not present

## 2016-08-10 DIAGNOSIS — E1165 Type 2 diabetes mellitus with hyperglycemia: Secondary | ICD-10-CM | POA: Diagnosis not present

## 2016-08-14 DIAGNOSIS — E119 Type 2 diabetes mellitus without complications: Secondary | ICD-10-CM | POA: Diagnosis not present

## 2016-08-14 DIAGNOSIS — I4891 Unspecified atrial fibrillation: Secondary | ICD-10-CM | POA: Diagnosis not present

## 2016-08-14 DIAGNOSIS — E7800 Pure hypercholesterolemia, unspecified: Secondary | ICD-10-CM | POA: Diagnosis not present

## 2016-08-21 ENCOUNTER — Ambulatory Visit (INDEPENDENT_AMBULATORY_CARE_PROVIDER_SITE_OTHER): Payer: Medicare Other | Admitting: Cardiovascular Disease

## 2016-08-21 ENCOUNTER — Encounter: Payer: Self-pay | Admitting: Cardiovascular Disease

## 2016-08-21 VITALS — BP 108/68 | HR 67 | Ht 63.0 in | Wt 144.0 lb

## 2016-08-21 DIAGNOSIS — Z299 Encounter for prophylactic measures, unspecified: Secondary | ICD-10-CM | POA: Diagnosis not present

## 2016-08-21 DIAGNOSIS — I480 Paroxysmal atrial fibrillation: Secondary | ICD-10-CM | POA: Diagnosis not present

## 2016-08-21 DIAGNOSIS — Z5181 Encounter for therapeutic drug level monitoring: Secondary | ICD-10-CM | POA: Diagnosis not present

## 2016-08-21 DIAGNOSIS — Z7901 Long term (current) use of anticoagulants: Secondary | ICD-10-CM

## 2016-08-21 DIAGNOSIS — E663 Overweight: Secondary | ICD-10-CM | POA: Diagnosis not present

## 2016-08-21 DIAGNOSIS — M25561 Pain in right knee: Secondary | ICD-10-CM | POA: Diagnosis not present

## 2016-08-21 NOTE — Progress Notes (Signed)
SUBJECTIVE: The patient presents for follow up of paroxysmal atrial fibrillation. She is doing well and denies chest pain, shortness of breath, and palpitations. She complains of feeling cold on Xarelto. She has right knee degenerative joint disease and will require replacement surgery in the near future.  She sees Dr. Gladstone Lighter for orthopedic care and has done so for 30 yrs.  Echocardiogram 05/18/16: Normal left ventricular systolic function, LVEF [id number], grade 1 diastolic dysfunction. The left atrium was normal in size.  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 Son in [place], daughter in [place].   Review of Systems: As per "subjective", otherwise negative.  Allergies  Allergen Reactions  . Cardizem [Diltiazem] Other (See Comments)    Patient states that she could not think, felt that she was in the twilight zone. Arm discomfort.  . Other     Antibiotic that started with an "O"-caused anaphylaxis  . Sulfa Antibiotics Itching    Current Outpatient Prescriptions  Medication Sig Dispense Refill  . acetaminophen (TYLENOL) 500 MG tablet Take 500 mg by mouth every 6 (six) hours as needed for headache.    . cetirizine (ZYRTEC) 10 MG tablet Take 10 mg by mouth daily.     Marland Kitchen glimepiride (AMARYL) 2 MG tablet Take 2 mg by mouth daily with breakfast.    . imipramine (TOFRANIL) 25 MG tablet Take 1 tablet (25 mg total) by mouth at bedtime. 30 tablet 5  . metFORMIN (GLUCOPHAGE) 500 MG tablet Take 500 mg by mouth 2 (two) times daily with a meal.    . metoprolol (LOPRESSOR) 50 MG tablet Take 50 mg by mouth 2 (two) times daily.    Marland Kitchen oxybutynin (DITROPAN-XL) 10 MG 24 hr tablet Take 10 mg by mouth daily.    . pantoprazole (PROTONIX) 40 MG tablet Take 40 mg by mouth at bedtime.     . rivaroxaban (XARELTO) 20 MG TABS tablet Take 1 tablet (20 mg total) by mouth daily with supper. 30 tablet 3   No current facility-administered medications for this visit.     Past Medical  History:  Diagnosis Date  . Arthritis   . Diabetes ([place])   . Dysphagia   . Dysrhythmia    afib  . GERD (gastroesophageal reflux disease)   . Headache   . History of bronchitis   . Seasonal allergies   . Toenail fungus     Past Surgical History:  Procedure Laterality Date  . back surgeries x 4    . BOTOX INJECTION N/A 06/06/2016   Procedure: BOTOX INJECTION;  Surgeon: Rogene Houston, MD;  [facility];  Service: Endoscopy;  Laterality: N/A;  . COLONOSCOPY N/A 11/05/2012   Procedure: COLONOSCOPY;  Surgeon: Rogene Houston, MD;  [facility];  Service: Endoscopy;  Laterality: N/A;  1030  . ESOPHAGEAL DILATION N/A 07/06/2015   Procedure: ESOPHAGEAL DILATION;  Surgeon: Rogene Houston, MD;  [facility];  Service: Endoscopy;  Laterality: N/A;  . ESOPHAGEAL DILATION N/A 06/06/2016   Procedure: ESOPHAGEAL DILATION;  Surgeon: Rogene Houston, MD;  [facility];  Service: Endoscopy;  Laterality: N/A;  . ESOPHAGOGASTRODUODENOSCOPY N/A 02/12/2014   Procedure: ESOPHAGOGASTRODUODENOSCOPY (EGD);  Surgeon: Rogene Houston, MD;  [facility];  Service: Endoscopy;  Laterality: N/A;  150  . ESOPHAGOGASTRODUODENOSCOPY N/A 07/06/2015   Procedure: ESOPHAGOGASTRODUODENOSCOPY (EGD);  Surgeon: Rogene Houston, MD;  [facility];  Service: Endoscopy;  Laterality:  N/A;  1:25 - moved to 1/18 @ 10:30 - Ann to notify pt  . ESOPHAGOGASTRODUODENOSCOPY (EGD) WITH ESOPHAGEAL DILATION N/A 07/25/2012   Procedure: ESOPHAGOGASTRODUODENOSCOPY (EGD) WITH ESOPHAGEAL DILATION;  Surgeon: Rogene Houston, MD;  [facility];  Service: Endoscopy;  Laterality: N/A;  325-rescheduled to [place] notified pt  . ESOPHAGOGASTRODUODENOSCOPY (EGD) WITH PROPOFOL N/A 06/06/2016   Procedure: ESOPHAGOGASTRODUODENOSCOPY (EGD) WITH PROPOFOL;  Surgeon: Rogene Houston, MD;  [facility];  Service: Endoscopy;  Laterality: N/A;  . Foot surgeries Bilateral     hammer toes  . MALONEY DILATION N/A 02/12/2014   Procedure: Venia Minks DILATION;  Surgeon: Rogene Houston, MD;  [facility];  Service: Endoscopy;  Laterality: N/A;  . Right knee arthroscopy     x2  . TOTAL ABDOMINAL HYSTERECTOMY      Social History   Social History  . Marital status: Divorced    Spouse name: N/A  . Number of children: N/A  . Years of education: N/A   Occupational History  . Not on file.   Social History Main Topics  . Smoking status: Never Smoker  . Smokeless tobacco: Never Used  . Alcohol use No     Comment: socially   . Drug use: No  . Sexual activity: Not Currently    Partners: Male    Birth control/ protection: None   Other Topics Concern  . Not on file   Social History Narrative  . No narrative on file     Vitals:   08/21/16 0817  BP: 108/68  Pulse: 67  SpO2: 99%  Weight: 144 lb (65.3 kg)  Height: 5\' 3"  (1.6 m)    PHYSICAL EXAM General: NAD HEENT: Normal. Neck: No JVD, no thyromegaly. Lungs: Clear to auscultation bilaterally with normal respiratory effort. CV: Nondisplaced PMI.  Regular rate and rhythm, normal S1/S2, no S3/S4, no murmur. No pretibial or periankle edema.  No carotid bruit.   Abdomen: Soft, nontender, no distention.  Neurologic: Alert and oriented.  Psych: Normal affect. Skin: Normal. Musculoskeletal: No gross deformities.    ECG: Most recent ECG reviewed.      ASSESSMENT AND PLAN: 1. Paroxysmal atrial fibrillation: Symptomatically stable on metoprolol. CHADSVASC score is 25 (age, gender, diabetes), thus thromboembolic risk is elevated. For this reason I will continue Xarelto.  Dispo: fu 6 months.   Kate Sable, M.D., F.A.C.C.

## 2016-08-21 NOTE — Patient Instructions (Signed)

## 2016-08-28 ENCOUNTER — Telehealth: Payer: Self-pay | Admitting: Cardiovascular Disease

## 2016-08-28 MED ORDER — RIVAROXABAN 20 MG PO TABS: 20.0000 mg | ORAL_TABLET | Freq: Every day | ORAL | 1 refills | Status: DC

## 2016-08-28 NOTE — Telephone Encounter (Signed)
Katrina Castro called stating that [facility] told her we have not sent in RX for rivaroxaban (XARELTO) 20 MG TABS tablet     Please call patient to confirm.

## 2016-08-28 NOTE — Telephone Encounter (Signed)
Pt aware we sent refills of Xarelto to [facility] as requested

## 2016-08-29 ENCOUNTER — Telehealth: Payer: Self-pay | Admitting: Cardiovascular Disease

## 2016-08-29 NOTE — Telephone Encounter (Signed)
Patient states that Xarelto is $370.96 and she can not afford this. Pt has applied for patient assistance of which she has been denied d/t income. I have given pt the number to Denyse Amass at [facility] for drug assistance.

## 2016-08-29 NOTE — Telephone Encounter (Signed)
Patient states that she can not afford Xarelto and would like to switch to something cheaper.  Please call patient. / tg

## 2016-08-30 ENCOUNTER — Telehealth: Payer: Self-pay | Admitting: Adult Health

## 2016-08-30 NOTE — Telephone Encounter (Signed)
Pt called to inform office that she has appt with Denyse Amass on Tues. 09/04/16. Pt would like to know if can supply her with more samples of Xarelto. 2 Bottles placed at the front for patient pick-up. Patient will call her insurance company to see what medication they prefer and what tier Xarelto is currently.

## 2016-08-30 NOTE — Telephone Encounter (Signed)
Please call patient in regards to medicaiton. / tg

## 2016-09-06 DIAGNOSIS — D1724 Benign lipomatous neoplasm of skin and subcutaneous tissue of left leg: Secondary | ICD-10-CM | POA: Diagnosis not present

## 2016-09-06 DIAGNOSIS — E7800 Pure hypercholesterolemia, unspecified: Secondary | ICD-10-CM | POA: Diagnosis not present

## 2016-09-06 DIAGNOSIS — Z299 Encounter for prophylactic measures, unspecified: Secondary | ICD-10-CM | POA: Diagnosis not present

## 2016-09-06 DIAGNOSIS — Z6826 Body mass index (BMI) 26.0-26.9, adult: Secondary | ICD-10-CM | POA: Diagnosis not present

## 2016-09-06 DIAGNOSIS — Z713 Dietary counseling and surveillance: Secondary | ICD-10-CM | POA: Diagnosis not present

## 2016-09-06 DIAGNOSIS — E1165 Type 2 diabetes mellitus with hyperglycemia: Secondary | ICD-10-CM | POA: Diagnosis not present

## 2016-09-07 ENCOUNTER — Telehealth: Payer: Self-pay | Admitting: Adult Health

## 2016-09-07 NOTE — Telephone Encounter (Signed)
Requesting samples of Xarelto 20mg/tg    °

## 2016-09-10 NOTE — Telephone Encounter (Signed)
Xarelto 20 mg lot # [id number], exp 09/2018  2 bottles given, working with Denyse Amass at [employer], they won't have resolution for 3 weeks

## 2016-09-27 DIAGNOSIS — M1711 Unilateral primary osteoarthritis, right knee: Secondary | ICD-10-CM | POA: Diagnosis not present

## 2016-10-02 ENCOUNTER — Telehealth: Payer: Self-pay | Admitting: Cardiovascular Disease

## 2016-10-02 NOTE — Telephone Encounter (Signed)
Callled pt. Not answer. Left detailed message on pt's private voicemail.

## 2016-10-02 NOTE — Telephone Encounter (Signed)
[name] w/ [place]  lvm stating that pt is not eligible for medication assistance because her income for the year 2017 is above the limit. Katrina Castro can be reached @ [phone]

## 2016-10-05 ENCOUNTER — Telehealth: Payer: Self-pay | Admitting: Cardiovascular Disease

## 2016-10-05 DIAGNOSIS — E119 Type 2 diabetes mellitus without complications: Secondary | ICD-10-CM | POA: Diagnosis not present

## 2016-10-05 DIAGNOSIS — E7800 Pure hypercholesterolemia, unspecified: Secondary | ICD-10-CM | POA: Diagnosis not present

## 2016-10-05 DIAGNOSIS — I4891 Unspecified atrial fibrillation: Secondary | ICD-10-CM | POA: Diagnosis not present

## 2016-10-05 NOTE — Telephone Encounter (Signed)
Pt made aware that she was denied pt assistance. I will give her 2 weeks worth of samples but let her know we could not continue to give her samples as they are for new pt's that are started on xarelto.

## 2016-10-05 NOTE — Telephone Encounter (Signed)
Patient states she needs more samples of Xarelto. States that she has not heard from assistance program. / tg

## 2016-10-08 DIAGNOSIS — E663 Overweight: Secondary | ICD-10-CM | POA: Diagnosis not present

## 2016-10-08 DIAGNOSIS — E7800 Pure hypercholesterolemia, unspecified: Secondary | ICD-10-CM | POA: Diagnosis not present

## 2016-10-08 DIAGNOSIS — Z789 Other specified health status: Secondary | ICD-10-CM | POA: Diagnosis not present

## 2016-10-08 DIAGNOSIS — W57XXXA Bitten or stung by nonvenomous insect and other nonvenomous arthropods, initial encounter: Secondary | ICD-10-CM | POA: Diagnosis not present

## 2016-10-08 DIAGNOSIS — Z299 Encounter for prophylactic measures, unspecified: Secondary | ICD-10-CM | POA: Diagnosis not present

## 2016-10-08 DIAGNOSIS — J309 Allergic rhinitis, unspecified: Secondary | ICD-10-CM | POA: Diagnosis not present

## 2016-10-08 DIAGNOSIS — E1165 Type 2 diabetes mellitus with hyperglycemia: Secondary | ICD-10-CM | POA: Diagnosis not present

## 2016-10-10 ENCOUNTER — Telehealth: Payer: Self-pay | Admitting: Cardiovascular Disease

## 2016-10-10 NOTE — Telephone Encounter (Signed)
Pt would like to know if she can take the 15 mg of Xarelto, she got samples from her PCP for the 15mg , she can be reached on her cell @ [phone]

## 2016-10-10 NOTE — Telephone Encounter (Signed)
Spoke with pt. Advised her that 20 mg of Xarelto was best for her. She voiced understanding.

## 2016-10-10 NOTE — Telephone Encounter (Signed)
Would be at higher risk for stroke. She requires 20 mg.

## 2016-10-14 DIAGNOSIS — Z79899 Other long term (current) drug therapy: Secondary | ICD-10-CM | POA: Diagnosis not present

## 2016-10-14 DIAGNOSIS — J029 Acute pharyngitis, unspecified: Secondary | ICD-10-CM | POA: Diagnosis not present

## 2016-10-14 DIAGNOSIS — M542 Cervicalgia: Secondary | ICD-10-CM | POA: Diagnosis not present

## 2016-10-14 DIAGNOSIS — R490 Dysphonia: Secondary | ICD-10-CM | POA: Diagnosis not present

## 2016-10-17 DIAGNOSIS — J40 Bronchitis, not specified as acute or chronic: Secondary | ICD-10-CM | POA: Diagnosis not present

## 2016-10-17 DIAGNOSIS — J029 Acute pharyngitis, unspecified: Secondary | ICD-10-CM | POA: Diagnosis not present

## 2016-10-17 DIAGNOSIS — Z6827 Body mass index (BMI) 27.0-27.9, adult: Secondary | ICD-10-CM | POA: Diagnosis not present

## 2016-10-17 DIAGNOSIS — E1165 Type 2 diabetes mellitus with hyperglycemia: Secondary | ICD-10-CM | POA: Diagnosis not present

## 2016-10-29 DIAGNOSIS — E1140 Type 2 diabetes mellitus with diabetic neuropathy, unspecified: Secondary | ICD-10-CM | POA: Diagnosis not present

## 2016-10-29 DIAGNOSIS — E1151 Type 2 diabetes mellitus with diabetic peripheral angiopathy without gangrene: Secondary | ICD-10-CM | POA: Diagnosis not present

## 2016-10-31 DIAGNOSIS — E7800 Pure hypercholesterolemia, unspecified: Secondary | ICD-10-CM | POA: Diagnosis not present

## 2016-10-31 DIAGNOSIS — E663 Overweight: Secondary | ICD-10-CM | POA: Diagnosis not present

## 2016-10-31 DIAGNOSIS — M8580 Other specified disorders of bone density and structure, unspecified site: Secondary | ICD-10-CM | POA: Diagnosis not present

## 2016-10-31 DIAGNOSIS — Z299 Encounter for prophylactic measures, unspecified: Secondary | ICD-10-CM | POA: Diagnosis not present

## 2016-10-31 DIAGNOSIS — K219 Gastro-esophageal reflux disease without esophagitis: Secondary | ICD-10-CM | POA: Diagnosis not present

## 2016-10-31 DIAGNOSIS — J45901 Unspecified asthma with (acute) exacerbation: Secondary | ICD-10-CM | POA: Diagnosis not present

## 2016-10-31 DIAGNOSIS — I4891 Unspecified atrial fibrillation: Secondary | ICD-10-CM | POA: Diagnosis not present

## 2016-10-31 DIAGNOSIS — F419 Anxiety disorder, unspecified: Secondary | ICD-10-CM | POA: Diagnosis not present

## 2016-10-31 DIAGNOSIS — E1165 Type 2 diabetes mellitus with hyperglycemia: Secondary | ICD-10-CM | POA: Diagnosis not present

## 2016-10-31 DIAGNOSIS — I10 Essential (primary) hypertension: Secondary | ICD-10-CM | POA: Diagnosis not present

## 2016-11-05 DIAGNOSIS — Z299 Encounter for prophylactic measures, unspecified: Secondary | ICD-10-CM | POA: Diagnosis not present

## 2016-11-05 DIAGNOSIS — I10 Essential (primary) hypertension: Secondary | ICD-10-CM | POA: Diagnosis not present

## 2016-11-05 DIAGNOSIS — R110 Nausea: Secondary | ICD-10-CM | POA: Diagnosis not present

## 2016-11-05 DIAGNOSIS — R5383 Other fatigue: Secondary | ICD-10-CM | POA: Diagnosis not present

## 2016-11-05 DIAGNOSIS — R197 Diarrhea, unspecified: Secondary | ICD-10-CM | POA: Diagnosis not present

## 2016-11-05 DIAGNOSIS — K529 Noninfective gastroenteritis and colitis, unspecified: Secondary | ICD-10-CM | POA: Diagnosis not present

## 2016-11-05 DIAGNOSIS — I4891 Unspecified atrial fibrillation: Secondary | ICD-10-CM | POA: Diagnosis not present

## 2016-11-05 DIAGNOSIS — Z6826 Body mass index (BMI) 26.0-26.9, adult: Secondary | ICD-10-CM | POA: Diagnosis not present

## 2016-11-14 DIAGNOSIS — I4891 Unspecified atrial fibrillation: Secondary | ICD-10-CM | POA: Diagnosis not present

## 2016-11-14 DIAGNOSIS — E119 Type 2 diabetes mellitus without complications: Secondary | ICD-10-CM | POA: Diagnosis not present

## 2016-11-14 DIAGNOSIS — E7800 Pure hypercholesterolemia, unspecified: Secondary | ICD-10-CM | POA: Diagnosis not present

## 2016-11-22 ENCOUNTER — Telehealth (INDEPENDENT_AMBULATORY_CARE_PROVIDER_SITE_OTHER): Payer: Self-pay | Admitting: Internal Medicine

## 2016-11-22 NOTE — Telephone Encounter (Signed)
Patient called and in her message she stated that she needs to speak to Terri about something.  [phone]

## 2016-11-26 ENCOUNTER — Telehealth (INDEPENDENT_AMBULATORY_CARE_PROVIDER_SITE_OTHER): Payer: Self-pay | Admitting: Internal Medicine

## 2016-11-26 DIAGNOSIS — K219 Gastro-esophageal reflux disease without esophagitis: Secondary | ICD-10-CM

## 2016-11-26 MED ORDER — PANTOPRAZOLE SODIUM 40 MG PO TBEC: 40.0000 mg | DELAYED_RELEASE_TABLET | Freq: Every day | ORAL | 5 refills | Status: DC

## 2016-11-26 NOTE — Telephone Encounter (Signed)
Rx sent to her phamacy 

## 2016-11-26 NOTE — Telephone Encounter (Signed)
No answer. Will call later

## 2016-11-26 NOTE — Telephone Encounter (Signed)
Patient called, stated that she needs more Pantoprazole (she thinks this is the name of it).  She had stopped taking it, then started back up and it's helping.  She'd like it called to [place].  [phone]

## 2016-11-26 NOTE — Telephone Encounter (Signed)
I have clalled an Rx in for Protonix

## 2016-12-03 DIAGNOSIS — Z299 Encounter for prophylactic measures, unspecified: Secondary | ICD-10-CM | POA: Diagnosis not present

## 2016-12-03 DIAGNOSIS — I10 Essential (primary) hypertension: Secondary | ICD-10-CM | POA: Diagnosis not present

## 2016-12-03 DIAGNOSIS — Z6827 Body mass index (BMI) 27.0-27.9, adult: Secondary | ICD-10-CM | POA: Diagnosis not present

## 2016-12-03 DIAGNOSIS — Z713 Dietary counseling and surveillance: Secondary | ICD-10-CM | POA: Diagnosis not present

## 2016-12-03 DIAGNOSIS — F419 Anxiety disorder, unspecified: Secondary | ICD-10-CM | POA: Diagnosis not present

## 2016-12-06 DIAGNOSIS — E11319 Type 2 diabetes mellitus with unspecified diabetic retinopathy without macular edema: Secondary | ICD-10-CM | POA: Diagnosis not present

## 2016-12-12 DIAGNOSIS — I10 Essential (primary) hypertension: Secondary | ICD-10-CM | POA: Diagnosis not present

## 2016-12-12 DIAGNOSIS — Z299 Encounter for prophylactic measures, unspecified: Secondary | ICD-10-CM | POA: Diagnosis not present

## 2016-12-12 DIAGNOSIS — Z713 Dietary counseling and surveillance: Secondary | ICD-10-CM | POA: Diagnosis not present

## 2016-12-12 DIAGNOSIS — Z6827 Body mass index (BMI) 27.0-27.9, adult: Secondary | ICD-10-CM | POA: Diagnosis not present

## 2016-12-12 DIAGNOSIS — M25561 Pain in right knee: Secondary | ICD-10-CM | POA: Diagnosis not present

## 2016-12-12 DIAGNOSIS — E7800 Pure hypercholesterolemia, unspecified: Secondary | ICD-10-CM | POA: Diagnosis not present

## 2016-12-12 DIAGNOSIS — F419 Anxiety disorder, unspecified: Secondary | ICD-10-CM | POA: Diagnosis not present

## 2016-12-12 DIAGNOSIS — I4891 Unspecified atrial fibrillation: Secondary | ICD-10-CM | POA: Diagnosis not present

## 2016-12-12 DIAGNOSIS — J45909 Unspecified asthma, uncomplicated: Secondary | ICD-10-CM | POA: Diagnosis not present

## 2016-12-31 ENCOUNTER — Ambulatory Visit (INDEPENDENT_AMBULATORY_CARE_PROVIDER_SITE_OTHER): Payer: Medicare Other | Admitting: Internal Medicine

## 2016-12-31 DIAGNOSIS — M1711 Unilateral primary osteoarthritis, right knee: Secondary | ICD-10-CM | POA: Diagnosis not present

## 2017-01-07 DIAGNOSIS — I4891 Unspecified atrial fibrillation: Secondary | ICD-10-CM | POA: Diagnosis not present

## 2017-01-07 DIAGNOSIS — Z299 Encounter for prophylactic measures, unspecified: Secondary | ICD-10-CM | POA: Diagnosis not present

## 2017-01-07 DIAGNOSIS — M1711 Unilateral primary osteoarthritis, right knee: Secondary | ICD-10-CM | POA: Diagnosis not present

## 2017-01-07 DIAGNOSIS — E663 Overweight: Secondary | ICD-10-CM | POA: Diagnosis not present

## 2017-01-07 DIAGNOSIS — F419 Anxiety disorder, unspecified: Secondary | ICD-10-CM | POA: Diagnosis not present

## 2017-01-07 DIAGNOSIS — E7800 Pure hypercholesterolemia, unspecified: Secondary | ICD-10-CM | POA: Diagnosis not present

## 2017-01-07 DIAGNOSIS — R21 Rash and other nonspecific skin eruption: Secondary | ICD-10-CM | POA: Diagnosis not present

## 2017-01-08 DIAGNOSIS — E119 Type 2 diabetes mellitus without complications: Secondary | ICD-10-CM | POA: Diagnosis not present

## 2017-01-08 DIAGNOSIS — E7800 Pure hypercholesterolemia, unspecified: Secondary | ICD-10-CM | POA: Diagnosis not present

## 2017-01-08 DIAGNOSIS — I4891 Unspecified atrial fibrillation: Secondary | ICD-10-CM | POA: Diagnosis not present

## 2017-01-14 DIAGNOSIS — M1711 Unilateral primary osteoarthritis, right knee: Secondary | ICD-10-CM | POA: Diagnosis not present

## 2017-02-01 DIAGNOSIS — I10 Essential (primary) hypertension: Secondary | ICD-10-CM | POA: Diagnosis not present

## 2017-02-01 DIAGNOSIS — Y92009 Unspecified place in unspecified non-institutional (private) residence as the place of occurrence of the external cause: Secondary | ICD-10-CM | POA: Diagnosis not present

## 2017-02-01 DIAGNOSIS — Z713 Dietary counseling and surveillance: Secondary | ICD-10-CM | POA: Diagnosis not present

## 2017-02-01 DIAGNOSIS — I4891 Unspecified atrial fibrillation: Secondary | ICD-10-CM | POA: Diagnosis not present

## 2017-02-01 DIAGNOSIS — Z299 Encounter for prophylactic measures, unspecified: Secondary | ICD-10-CM | POA: Diagnosis not present

## 2017-02-01 DIAGNOSIS — E7800 Pure hypercholesterolemia, unspecified: Secondary | ICD-10-CM | POA: Diagnosis not present

## 2017-02-01 DIAGNOSIS — E663 Overweight: Secondary | ICD-10-CM | POA: Diagnosis not present

## 2017-02-01 DIAGNOSIS — W19XXXA Unspecified fall, initial encounter: Secondary | ICD-10-CM | POA: Diagnosis not present

## 2017-02-21 ENCOUNTER — Encounter: Payer: Self-pay | Admitting: Cardiovascular Disease

## 2017-02-21 ENCOUNTER — Ambulatory Visit (INDEPENDENT_AMBULATORY_CARE_PROVIDER_SITE_OTHER): Payer: Medicare Other | Admitting: Cardiovascular Disease

## 2017-02-21 VITALS — BP 100/62 | HR 78 | Ht 63.0 in | Wt 144.0 lb

## 2017-02-21 DIAGNOSIS — Z7901 Long term (current) use of anticoagulants: Secondary | ICD-10-CM

## 2017-02-21 DIAGNOSIS — I480 Paroxysmal atrial fibrillation: Secondary | ICD-10-CM

## 2017-02-21 DIAGNOSIS — Z5181 Encounter for therapeutic drug level monitoring: Secondary | ICD-10-CM

## 2017-02-21 MED ORDER — DABIGATRAN ETEXILATE MESYLATE 150 MG PO CAPS: 150.0000 mg | ORAL_CAPSULE | Freq: Two times a day (BID) | ORAL | 0 refills | Status: DC

## 2017-02-21 MED ORDER — DABIGATRAN ETEXILATE MESYLATE 150 MG PO CAPS: 150.0000 mg | ORAL_CAPSULE | Freq: Two times a day (BID) | ORAL | 6 refills | Status: DC

## 2017-02-21 NOTE — Progress Notes (Signed)
SUBJECTIVE: The patient presents for follow up of paroxysmal atrial fibrillation.  Echocardiogram 05/18/16: Normal left ventricular systolic function, LVEF 93-23%, grade 1 diastolic dysfunction. The left atrium was normal in size.  The patient denies any symptoms of chest pain, palpitations, shortness of breath, leg swelling, orthopnea, PND, and syncope.  She said Xarelto was costing her $348 monthly and she did not qualify for assistance. We talked about warfarin but she is not interested.  She needs to undergo right knee replacement surgery. She sees Dr. Gladstone Lighter.      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.    Review of Systems: As per "subjective", otherwise negative.  Allergies  Allergen Reactions  . Cardizem [Diltiazem] Other (See Comments)    Patient states that she could not think, felt that she was in the twilight zone. Arm discomfort.  . Other     Antibiotic that started with an "O"-caused anaphylaxis  . Sulfa Antibiotics Itching    Current Outpatient Prescriptions  Medication Sig Dispense Refill  . acetaminophen (TYLENOL) 500 MG tablet Take 500 mg by mouth every 6 (six) hours as needed for headache.    . cetirizine (ZYRTEC) 10 MG tablet Take 10 mg by mouth daily.     Marland Kitchen glimepiride (AMARYL) 2 MG tablet Take 2 mg by mouth daily with breakfast.    . imipramine (TOFRANIL) 25 MG tablet Take 1 tablet (25 mg total) by mouth at bedtime. 30 tablet 5  . metFORMIN (GLUCOPHAGE) 500 MG tablet Take 500 mg by mouth 2 (two) times daily with a meal.    . metoprolol (LOPRESSOR) 50 MG tablet Take 50 mg by mouth daily.     Marland Kitchen oxybutynin (DITROPAN-XL) 10 MG 24 hr tablet Take 10 mg by mouth daily.    . pantoprazole (PROTONIX) 40 MG tablet Take 1 tablet (40 mg total) by mouth at bedtime. 30 tablet 5  . rivaroxaban (XARELTO) 20 MG TABS tablet Take 1 tablet (20 mg total) by mouth daily with supper. 90 tablet 1   No current  facility-administered medications for this visit.     Past Medical History:  Diagnosis Date  . Arthritis   . Diabetes ([place])   . Dysphagia   . Dysrhythmia    afib  . GERD (gastroesophageal reflux disease)   . Headache   . History of bronchitis   . Seasonal allergies   . Toenail fungus     Past Surgical History:  Procedure Laterality Date  . back surgeries x 4    . BOTOX INJECTION N/A 06/06/2016   Procedure: BOTOX INJECTION;  Surgeon: Rogene Houston, MD;  [facility];  Service: Endoscopy;  Laterality: N/A;  . COLONOSCOPY N/A 11/05/2012   Procedure: COLONOSCOPY;  Surgeon: Rogene Houston, MD;  [facility];  Service: Endoscopy;  Laterality: N/A;  1030  . ESOPHAGEAL DILATION N/A 07/06/2015   Procedure: ESOPHAGEAL DILATION;  Surgeon: Rogene Houston, MD;  [facility];  Service: Endoscopy;  Laterality: N/A;  . ESOPHAGEAL DILATION N/A 06/06/2016   Procedure: ESOPHAGEAL DILATION;  Surgeon: Rogene Houston, MD;  [facility];  Service: Endoscopy;  Laterality: N/A;  . ESOPHAGOGASTRODUODENOSCOPY N/A 02/12/2014   Procedure: ESOPHAGOGASTRODUODENOSCOPY (EGD);  Surgeon: Rogene Houston, MD;  [facility];  Service: Endoscopy;  Laterality: N/A;  150  . ESOPHAGOGASTRODUODENOSCOPY N/A 07/06/2015   Procedure: ESOPHAGOGASTRODUODENOSCOPY (EGD);  Surgeon: Mechele Dawley  Laural Golden, MD;  [facility];  Service: Endoscopy;  Laterality: N/A;  1:25 - moved to 1/18 @ 10:30 - Ann to notify pt  . ESOPHAGOGASTRODUODENOSCOPY (EGD) WITH ESOPHAGEAL DILATION N/A 07/25/2012   Procedure: ESOPHAGOGASTRODUODENOSCOPY (EGD) WITH ESOPHAGEAL DILATION;  Surgeon: Rogene Houston, MD;  [facility];  Service: Endoscopy;  Laterality: N/A;  325-rescheduled to [place] notified pt  . ESOPHAGOGASTRODUODENOSCOPY (EGD) WITH PROPOFOL N/A 06/06/2016   Procedure: ESOPHAGOGASTRODUODENOSCOPY (EGD) WITH PROPOFOL;  Surgeon: Rogene Houston, MD;  [facility];   Service: Endoscopy;  Laterality: N/A;  . Foot surgeries Bilateral    hammer toes  . MALONEY DILATION N/A 02/12/2014   Procedure: Venia Minks DILATION;  Surgeon: Rogene Houston, MD;  [facility];  Service: Endoscopy;  Laterality: N/A;  . Right knee arthroscopy     x2  . TOTAL ABDOMINAL HYSTERECTOMY      Social History   Social History  . Marital status: Divorced    Spouse name: N/A  . Number of children: N/A  . Years of education: N/A   Occupational History  . Not on file.   Social History Main Topics  . Smoking status: Never Smoker  . Smokeless tobacco: Never Used  . Alcohol use No     Comment: socially   . Drug use: No  . Sexual activity: Not Currently    Partners: Male    Birth control/ protection: None   Other Topics Concern  . Not on file   Social History Narrative  . No narrative on file     Vitals:   02/21/17 0957  BP: 100/62  Pulse: 78  SpO2: 98%  Weight: 144 lb (65.3 kg)  Height: 5\' 3"  (1.6 m)    Wt Readings from Last 3 Encounters:  02/21/17 144 lb (65.3 kg)  08/21/16 144 lb (65.3 kg)  07/02/16 142 lb (64.4 kg)     PHYSICAL EXAM General: NAD HEENT: Normal. Neck: No JVD, no thyromegaly. Lungs: Clear to auscultation bilaterally with normal respiratory effort. CV: Nondisplaced PMI.  Regular rate and rhythm, normal S1/S2, no S3/S4, no murmur. No pretibial or periankle edema.  No carotid bruit.   Abdomen: Soft, nontender, no distention.  Neurologic: Alert and oriented.  Psych: Normal affect. Skin: Normal. Musculoskeletal: No gross deformities.    ECG: Most recent ECG reviewed.   Labs: Lab Results  Component Value Date/Time   K 3.8 05/19/2016 06:57 AM   BUN 9 05/19/2016 06:57 AM   CREATININE 0.60 05/19/2016 06:57 AM   ALT 23 05/18/2016 07:47 AM   TSH 1.118 05/18/2016 07:23 PM   HGB 14.1 05/18/2016 07:47 AM     Lipids: No results found for: LDLCALC, LDLDIRECT, CHOL, TRIG, HDL     ASSESSMENT AND PLAN:  1. Paroxysmal  atrial fibrillation:Symptomatically stable on metoprolol. CHADSVASC score is 24 (age, gender, diabetes), thus thromboembolic risk is elevated. We talked about warfarin as it is more cost effective but she prefers not to take this. I will switch Xarelto to Pradaxa 150 mg twice daily and provide 2 weeks of samples to see if this is cheaper for her.     Disposition: Follow up 1 year.   Kate Sable, M.D., F.A.C.C.

## 2017-02-21 NOTE — Patient Instructions (Addendum)
Medication Instructions:   Stop Xarelto.   Begin Pradaxa 150mg  twice a day - samples, free 30 day supply card, & printed scripts given.  Continue all other medications.    Labwork: none  Testing/Procedures: none  Follow-Up: Your physician wants you to follow up in:  1 year.  You will receive a reminder letter in the mail one-two months in advance.  If you don't receive a letter, please call our office to schedule the follow up appointment   Any Other Special Instructions Will Be Listed Below (If Applicable). 1 month follow up with anticoagulation nurse Lattie Haw) for Pradaxa management.  If you need a refill on your cardiac medications before your next appointment, please call your pharmacy

## 2017-02-25 DIAGNOSIS — M25561 Pain in right knee: Secondary | ICD-10-CM | POA: Diagnosis not present

## 2017-02-25 DIAGNOSIS — M1711 Unilateral primary osteoarthritis, right knee: Secondary | ICD-10-CM | POA: Diagnosis not present

## 2017-02-26 ENCOUNTER — Telehealth: Payer: Self-pay | Admitting: *Deleted

## 2017-02-26 NOTE — Telephone Encounter (Signed)
Surgical clearance placed in folder on Dr. Court Joy desk.

## 2017-02-28 DIAGNOSIS — Z6826 Body mass index (BMI) 26.0-26.9, adult: Secondary | ICD-10-CM | POA: Diagnosis not present

## 2017-02-28 DIAGNOSIS — E1165 Type 2 diabetes mellitus with hyperglycemia: Secondary | ICD-10-CM | POA: Diagnosis not present

## 2017-02-28 DIAGNOSIS — E7800 Pure hypercholesterolemia, unspecified: Secondary | ICD-10-CM | POA: Diagnosis not present

## 2017-02-28 DIAGNOSIS — I4891 Unspecified atrial fibrillation: Secondary | ICD-10-CM | POA: Diagnosis not present

## 2017-02-28 DIAGNOSIS — I10 Essential (primary) hypertension: Secondary | ICD-10-CM | POA: Diagnosis not present

## 2017-02-28 DIAGNOSIS — Z299 Encounter for prophylactic measures, unspecified: Secondary | ICD-10-CM | POA: Diagnosis not present

## 2017-02-28 DIAGNOSIS — Z713 Dietary counseling and surveillance: Secondary | ICD-10-CM | POA: Diagnosis not present

## 2017-02-28 DIAGNOSIS — F419 Anxiety disorder, unspecified: Secondary | ICD-10-CM | POA: Diagnosis not present

## 2017-02-28 DIAGNOSIS — J45909 Unspecified asthma, uncomplicated: Secondary | ICD-10-CM | POA: Diagnosis not present

## 2017-03-19 DIAGNOSIS — M5441 Lumbago with sciatica, right side: Secondary | ICD-10-CM | POA: Diagnosis not present

## 2017-03-22 NOTE — Patient Instructions (Addendum)
Katrina Castro  03/22/2017   Your procedure is scheduled on: 04/03/17  Report to [facility] Main  Entrance Take [place]  elevators to 3rd floor to  [place] at    0830 AM.    Call this number if you have problems the morning of surgery [phone]   Remember: ONLY 1 PERSON MAY GO WITH YOU TO SHORT STAY TO GET  READY MORNING OF [place].  Do not eat food or drink liquids :After Midnight.     Take these medicines the morning of surgery with A SIP OF WATER: oxybutin, metoprolol,zyrtec, acylovir DO NOT TAKE ANY DIABETIC MEDICATIONS DAY OF YOUR SURGERY  DAY BEFORE SURGERY ONLY TAKE BREAKFAST/LUNCH DOSE OF GLIMEPIRIDE .                                You may not have any metal on your body including hair pins and              piercings  Do not wear jewelry, make-up, lotions, powders or perfumes, deodorant             Do not wear nail polish.  Do not shave  48 hours prior to surgery.     Do not bring valuables to the hospital. [place].  Contacts, dentures or bridgework may not be worn into surgery.  Leave suitcase in the car. After surgery it may be brought to your room.                Please read over the following fact sheets you were given: _____________________________________________________________________  [facility] - Preparing for Surgery Before surgery, you can play an important role.  Because skin is not sterile, your skin needs to be as free of germs as possible.  You can reduce the number of germs on your skin by washing with CHG (chlorahexidine gluconate) soap before surgery.  CHG is an antiseptic cleaner which kills germs and bonds with the skin to continue killing germs even after washing. Please DO NOT use if you have an allergy to CHG or antibacterial soaps.  If your skin becomes reddened/irritated stop using the CHG and inform your nurse when you arrive at Short Stay. Do not shave  (including legs and underarms) for at least 48 hours prior to the first CHG shower.  You may shave your face/neck. Please follow these instructions carefully:  1.  Shower with CHG Soap the night before surgery and the  morning of Surgery.  2.  If you choose to wash your hair, wash your hair first as usual with your  normal  shampoo.  3.  After you shampoo, rinse your hair and body thoroughly to remove the  shampoo.                           4.  Use CHG as you would any other liquid soap.  You can apply chg directly  to the skin and wash                       Gently with a scrungie or clean washcloth.  5.  Apply the CHG Soap to your body ONLY  FROM THE NECK DOWN.   Do not use on face/ open                           Wound or open sores. Avoid contact with eyes, ears mouth and genitals (private parts).                       Wash face,  Genitals (private parts) with your normal soap.             6.  Wash thoroughly, paying special attention to the area where your surgery  will be performed.  7.  Thoroughly rinse your body with warm water from the neck down.  8.  DO NOT shower/wash with your normal soap after using and rinsing off  the CHG Soap.                9.  Pat yourself dry with a clean towel.            10.  Wear clean pajamas.            11.  Place clean sheets on your bed the night of your first shower and do not  sleep with pets. Day of Surgery : Do not apply any lotions/deodorants the morning of surgery.  Please wear clean clothes to the hospital/surgery center.  FAILURE TO FOLLOW THESE INSTRUCTIONS MAY RESULT IN THE CANCELLATION OF YOUR SURGERY PATIENT SIGNATURE_________________________________  NURSE SIGNATURE__________________________________  ________________________________________________________________________            WHAT IS A BLOOD TRANSFUSION? Blood Transfusion Information  A transfusion is the replacement of blood or some of its parts. Blood is made up of multiple  cells which provide different functions.  Red blood cells carry oxygen and are used for blood loss replacement.  White blood cells fight against infection.  Platelets control bleeding.  Plasma helps clot blood.  Other blood products are available for specialized needs, such as hemophilia or other clotting disorders. BEFORE THE TRANSFUSION  Who gives blood for transfusions?   Healthy volunteers who are fully evaluated to make sure their blood is safe. This is blood bank blood. Transfusion therapy is the safest it has ever been in the practice of medicine. Before blood is taken from a donor, a complete history is taken to make sure that person has no history of diseases nor engages in risky social behavior (examples are intravenous drug use or sexual activity with multiple partners). The donor's travel history is screened to minimize risk of transmitting infections, such as malaria. The donated blood is tested for signs of infectious diseases, such as HIV and hepatitis. The blood is then tested to be sure it is compatible with you in order to minimize the chance of a transfusion reaction. If you or a relative donates blood, this is often done in anticipation of surgery and is not appropriate for emergency situations. It takes many days to process the donated blood. RISKS AND COMPLICATIONS Although transfusion therapy is very safe and saves many lives, the main dangers of transfusion include:   Getting an infectious disease.  Developing a transfusion reaction. This is an allergic reaction to something in the blood you were given. Every precaution is taken to prevent this. The decision to have a blood transfusion has been considered carefully by your caregiver before blood is given. Blood is not given unless the benefits outweigh the risks. AFTER THE TRANSFUSION  Right after  receiving a blood transfusion, you will usually feel much better and more energetic. This is especially true if your red  blood cells have gotten low (anemic). The transfusion raises the level of the red blood cells which carry oxygen, and this usually causes an energy increase.  The nurse administering the transfusion will monitor you carefully for complications. HOME CARE INSTRUCTIONS  No special instructions are needed after a transfusion. You may find your energy is better. Speak with your caregiver about any limitations on activity for underlying diseases you may have. SEEK MEDICAL CARE IF:   Your condition is not improving after your transfusion.  You develop redness or irritation at the intravenous (IV) site. SEEK IMMEDIATE MEDICAL CARE IF:  Any of the following symptoms occur over the next 12 hours:  Shaking chills.  You have a temperature by mouth above 102 F (38.9 C), not controlled by medicine.  Chest, back, or muscle pain.  People around you feel you are not acting correctly or are confused.  Shortness of breath or difficulty breathing.  Dizziness and fainting.  You get a rash or develop hives.  You have a decrease in urine output.  Your urine turns a dark color or changes to pink, red, or brown. Any of the following symptoms occur over the next 10 days:  You have a temperature by mouth above 102 F (38.9 C), not controlled by medicine.  Shortness of breath.  Weakness after normal activity.  The white part of the eye turns yellow (jaundice).  You have a decrease in the amount of urine or are urinating less often.  Your urine turns a dark color or changes to pink, red, or brown. Document Released: 06/01/2000 Document Revised: 08/27/2011 Document Reviewed: 01/19/2008 ExitCare Patient Information 2014 [place].  _______________________________________________________________________  Incentive Spirometer  An incentive spirometer is a tool that can help keep your lungs clear and active. This tool measures how well you are filling your lungs with each breath. Taking  long deep breaths may help reverse or decrease the chance of developing breathing (pulmonary) problems (especially infection) following:  A long period of time when you are unable to move or be active. BEFORE THE PROCEDURE   If the spirometer includes an indicator to show your best effort, your nurse or respiratory therapist will set it to a desired goal.  If possible, sit up straight or lean slightly forward. Try not to slouch.  Hold the incentive spirometer in an upright position. INSTRUCTIONS FOR USE  1. Sit on the edge of your bed if possible, or sit up as far as you can in bed or on a chair. 2. Hold the incentive spirometer in an upright position. 3. Breathe out normally. 4. Place the mouthpiece in your mouth and seal your lips tightly around it. 5. Breathe in slowly and as deeply as possible, raising the piston or the ball toward the top of the column. 6. Hold your breath for 3-5 seconds or for as long as possible. Allow the piston or ball to fall to the bottom of the column. 7. Remove the mouthpiece from your mouth and breathe out normally. 8. Rest for a few seconds and repeat Steps 1 through 7 at least 10 times every 1-2 hours when you are awake. Take your time and take a few normal breaths between deep breaths. 9. The spirometer may include an indicator to show your best effort. Use the indicator as a goal to work toward during each repetition. 10. After each set  of 10 deep breaths, practice coughing to be sure your lungs are clear. If you have an incision (the cut made at the time of surgery), support your incision when coughing by placing a pillow or rolled up towels firmly against it. Once you are able to get out of bed, walk around indoors and cough well. You may stop using the incentive spirometer when instructed by your caregiver.  RISKS AND COMPLICATIONS  Take your time so you do not get dizzy or light-headed.  If you are in pain, you may need to take or ask for pain  medication before doing incentive spirometry. It is harder to take a deep breath if you are having pain. AFTER USE  Rest and breathe slowly and easily.  It can be helpful to keep track of a log of your progress. Your caregiver can provide you with a simple table to help with this. If you are using the spirometer at home, follow these instructions: [place] IF:   You are having difficultly using the spirometer.  You have trouble using the spirometer as often as instructed.  Your pain medication is not giving enough relief while using the spirometer.  You develop fever of 100.5 F (38.1 C) or higher. SEEK IMMEDIATE MEDICAL CARE IF:   You cough up bloody sputum that had not been present before.  You develop fever of 102 F (38.9 C) or greater.  You develop worsening pain at or near the incision site. MAKE SURE YOU:   Understand these instructions.  Will watch your condition.  Will get help right away if you are not doing well or get worse. Document Released: 10/15/2006 Document Revised: 08/27/2011 Document Reviewed: 12/16/2006 [facility] Patient Information 2014 [place], [place].   ________________________________________________________________________

## 2017-03-22 NOTE — Progress Notes (Signed)
Clearance Dr. Bronson Ing 02/26/17 chart ovn 02/28/17 chart 02/21/17 epic hgb a1c 02/29/12 chart Echo 05/18/16 epic cxr 05/18/16 1 view epic

## 2017-03-25 ENCOUNTER — Encounter (HOSPITAL_COMMUNITY): Payer: Self-pay

## 2017-03-25 ENCOUNTER — Telehealth: Payer: Self-pay

## 2017-03-25 ENCOUNTER — Encounter (INDEPENDENT_AMBULATORY_CARE_PROVIDER_SITE_OTHER): Payer: Self-pay

## 2017-03-25 ENCOUNTER — Encounter (HOSPITAL_COMMUNITY)
Admission: RE | Admit: 2017-03-25 | Discharge: 2017-03-25 | Disposition: A | Discharge status: Home / Self Care | Payer: Medicare Other | Source: Ambulatory Visit | Attending: Orthopedic Surgery | Admitting: Orthopedic Surgery

## 2017-03-25 ENCOUNTER — Ambulatory Visit (HOSPITAL_COMMUNITY)
Admission: RE | Admit: 2017-03-25 | Discharge: 2017-03-25 | Disposition: A | Discharge status: Home / Self Care | Payer: Medicare Other | Source: Ambulatory Visit | Attending: Surgical | Admitting: Surgical

## 2017-03-25 DIAGNOSIS — M1711 Unilateral primary osteoarthritis, right knee: Secondary | ICD-10-CM | POA: Diagnosis not present

## 2017-03-25 DIAGNOSIS — I10 Essential (primary) hypertension: Secondary | ICD-10-CM | POA: Diagnosis not present

## 2017-03-25 DIAGNOSIS — Z7901 Long term (current) use of anticoagulants: Secondary | ICD-10-CM | POA: Diagnosis not present

## 2017-03-25 DIAGNOSIS — Z23 Encounter for immunization: Secondary | ICD-10-CM | POA: Diagnosis not present

## 2017-03-25 DIAGNOSIS — Z01818 Encounter for other preprocedural examination: Secondary | ICD-10-CM | POA: Diagnosis not present

## 2017-03-25 DIAGNOSIS — R001 Bradycardia, unspecified: Secondary | ICD-10-CM | POA: Insufficient documentation

## 2017-03-25 DIAGNOSIS — Z79899 Other long term (current) drug therapy: Secondary | ICD-10-CM | POA: Diagnosis not present

## 2017-03-25 DIAGNOSIS — Z01810 Encounter for preprocedural cardiovascular examination: Secondary | ICD-10-CM | POA: Diagnosis not present

## 2017-03-25 HISTORY — DX: Concussion with loss of consciousness of unspecified duration, initial encounter: S06.0X9A

## 2017-03-25 HISTORY — DX: Concussion with loss of consciousness status unknown, initial encounter: S06.0XAA

## 2017-03-25 HISTORY — DX: Major depressive disorder, single episode, unspecified: F32.9

## 2017-03-25 HISTORY — DX: Depression, unspecified: F32.A

## 2017-03-25 LAB — URINALYSIS, ROUTINE W REFLEX MICROSCOPIC
Bacteria, UA: NONE SEEN
Bilirubin Urine: NEGATIVE
Glucose, UA: NEGATIVE mg/dL
Hgb urine dipstick: NEGATIVE
Ketones, ur: NEGATIVE mg/dL
Nitrite: NEGATIVE
Protein, ur: NEGATIVE mg/dL
RBC / HPF: NONE SEEN RBC/hpf (ref 0–5)
Specific Gravity, Urine: 1.017 (ref 1.005–1.030)
pH: 6 (ref 5.0–8.0)

## 2017-03-25 LAB — CBC WITH DIFFERENTIAL/PLATELET
Basophils Absolute: 0 10*3/uL (ref 0.0–0.1)
Basophils Relative: 0 %
Eosinophils Absolute: 0.1 10*3/uL (ref 0.0–0.7)
Eosinophils Relative: 0 %
HCT: 40.2 % (ref 36.0–46.0)
Hemoglobin: 13.4 g/dL (ref 12.0–15.0)
Lymphocytes Relative: 23 %
Lymphs Abs: 3.9 10*3/uL (ref 0.7–4.0)
MCH: 28.9 pg (ref 26.0–34.0)
MCHC: 33.3 g/dL (ref 30.0–36.0)
MCV: 86.8 fL (ref 78.0–100.0)
Monocytes Absolute: 1 10*3/uL (ref 0.1–1.0)
Monocytes Relative: 6 %
Neutro Abs: 12.1 10*3/uL — ABNORMAL HIGH (ref 1.7–7.7)
Neutrophils Relative %: 71 %
Platelets: 354 10*3/uL (ref 150–400)
RBC: 4.63 MIL/uL (ref 3.87–5.11)
RDW: 13.6 % (ref 11.5–15.5)
WBC: 17.1 10*3/uL — ABNORMAL HIGH (ref 4.0–10.5)

## 2017-03-25 LAB — COMPREHENSIVE METABOLIC PANEL
ALT: 22 U/L (ref 14–54)
AST: 21 U/L (ref 15–41)
Albumin: 4.2 g/dL (ref 3.5–5.0)
Alkaline Phosphatase: 79 U/L (ref 38–126)
Anion gap: 9 (ref 5–15)
BUN: 15 mg/dL (ref 6–20)
CO2: 28 mmol/L (ref 22–32)
Calcium: 9.9 mg/dL (ref 8.9–10.3)
Chloride: 105 mmol/L (ref 101–111)
Creatinine, Ser: 0.72 mg/dL (ref 0.44–1.00)
GFR calc Af Amer: >60 mL/min (ref >60)
GFR calc non Af Amer: >60 mL/min (ref >60)
Glucose, Bld: 86 mg/dL (ref 65–99)
Potassium: 4.2 mmol/L (ref 3.5–5.1)
Sodium: 142 mmol/L (ref 135–145)
Total Bilirubin: 0.4 mg/dL (ref 0.3–1.2)
Total Protein: 7.7 g/dL (ref 6.5–8.1)

## 2017-03-25 LAB — SURGICAL PCR SCREEN
MRSA, PCR: NEGATIVE
Staphylococcus aureus: NEGATIVE

## 2017-03-25 LAB — GLUCOSE, CAPILLARY: Glucose-Capillary: 90 mg/dL (ref 65–99)

## 2017-03-25 LAB — APTT: aPTT: 33 seconds (ref 24–36)

## 2017-03-25 LAB — PROTIME-INR
INR: 1.3
Prothrombin Time: 16.1 seconds — ABNORMAL HIGH (ref 11.4–15.2)

## 2017-03-25 LAB — ABO/RH: ABO/RH(D): A POS

## 2017-03-25 NOTE — Progress Notes (Signed)
Spoke with Donnetta Simpers at Dr. Raylene Everts office regarding pt.'s instruction on stopping Xarelto prior to surgery. Kaylee  Will call Dr. Ivonne Andrew and let pt. Know instructions.

## 2017-03-25 NOTE — Progress Notes (Signed)
EKG, PT UA and cbc done 03/25/17 routed to Dr. Gladstone Lighter via epic

## 2017-03-25 NOTE — Telephone Encounter (Signed)
Patient notified

## 2017-03-25 NOTE — Telephone Encounter (Signed)
Misty from surgical center called stating patient was having knee replacement on 10/17 and patient needs to know how long to hold xarelto prior to and after

## 2017-03-25 NOTE — Telephone Encounter (Signed)
Hold 48 hrs beforehand. Resume as soon as feasible as per ortho.

## 2017-03-26 DIAGNOSIS — R3915 Urgency of urination: Secondary | ICD-10-CM | POA: Diagnosis not present

## 2017-03-26 DIAGNOSIS — R350 Frequency of micturition: Secondary | ICD-10-CM | POA: Diagnosis not present

## 2017-03-26 DIAGNOSIS — N3946 Mixed incontinence: Secondary | ICD-10-CM | POA: Diagnosis not present

## 2017-03-28 DIAGNOSIS — E7800 Pure hypercholesterolemia, unspecified: Secondary | ICD-10-CM | POA: Diagnosis not present

## 2017-03-28 DIAGNOSIS — E119 Type 2 diabetes mellitus without complications: Secondary | ICD-10-CM | POA: Diagnosis not present

## 2017-03-28 DIAGNOSIS — I4891 Unspecified atrial fibrillation: Secondary | ICD-10-CM | POA: Diagnosis not present

## 2017-03-29 NOTE — Progress Notes (Signed)
Called pt. To inform of time change. Pt. To arrive At 745am for surgery at 1015am. Pt. Verbalized understanding.

## 2017-04-02 NOTE — H&P (Signed)
TOTAL KNEE ADMISSION H&P  Patient is being admitted for right total knee arthroplasty.  Subjective:  Chief Complaint:right knee pain.  HPI: Katrina Castro, 71 y.o. female, has a history of pain and functional disability in the right knee due to arthritis and has failed non-surgical conservative treatments for greater than 12 weeks to includeNSAID's and/or analgesics, corticosteriod injections, viscosupplementation injections, use of assistive devices and activity modification.  Onset of symptoms was gradual, starting 5 years ago with gradually worsening course since that time. The patient noted prior procedures on the knee to include  arthroscopy and menisectomy on the right knee(s).  Patient currently rates pain in the right knee(s) at 8 out of 10 with activity. Patient has night pain, worsening of pain with activity and weight bearing, pain that interferes with activities of daily living, pain with passive range of motion, crepitus and joint swelling.  Patient has evidence of periarticular osteophytes and joint space narrowing by imaging studies. There is no active infection.  Patient Active Problem List   Diagnosis Date Noted  . Other dysphagia 05/30/2016  . New onset a-fib ([place]) 05/18/2016  . Hypokalemia 05/18/2016  . Diabetes ([place]) 05/18/2016  . Atrial fibrillation ([place]) 05/18/2016  . Achalasia 05/15/2016  . Migraine without aura and without status migrainosus, not intractable 05/10/2015  . GERD (gastroesophageal reflux disease) 01/07/2012  . Dysphagia 11/26/2011   Past Medical History:  Diagnosis Date  . Arthritis   . Concussion    2015  . Depression   . Diabetes ([place])    type 2  . Dysphagia   . Dysrhythmia    afib  . GERD (gastroesophageal reflux disease)   . Headache   . History of bronchitis   . Seasonal allergies   . Toenail fungus     Past Surgical History:  Procedure Laterality Date  . APPENDECTOMY    . BACK SURGERY     x2  . BOTOX INJECTION N/A 06/06/2016    Procedure: BOTOX INJECTION;  Surgeon: Rogene Houston, MD;  [facility];  Service: Endoscopy;  Laterality: N/A;  . CHOLECYSTECTOMY    . COLONOSCOPY N/A 11/05/2012   Procedure: COLONOSCOPY;  Surgeon: Rogene Houston, MD;  [facility];  Service: Endoscopy;  Laterality: N/A;  1030  . ESOPHAGEAL DILATION N/A 07/06/2015   Procedure: ESOPHAGEAL DILATION;  Surgeon: Rogene Houston, MD;  [facility];  Service: Endoscopy;  Laterality: N/A;  . ESOPHAGEAL DILATION N/A 06/06/2016   Procedure: ESOPHAGEAL DILATION;  Surgeon: Rogene Houston, MD;  [facility];  Service: Endoscopy;  Laterality: N/A;  . ESOPHAGOGASTRODUODENOSCOPY N/A 02/12/2014   Procedure: ESOPHAGOGASTRODUODENOSCOPY (EGD);  Surgeon: Rogene Houston, MD;  [facility];  Service: Endoscopy;  Laterality: N/A;  150  . ESOPHAGOGASTRODUODENOSCOPY N/A 07/06/2015   Procedure: ESOPHAGOGASTRODUODENOSCOPY (EGD);  Surgeon: Rogene Houston, MD;  [facility];  Service: Endoscopy;  Laterality: N/A;  1:25 - moved to 1/18 @ 10:30 - Ann to notify pt  . ESOPHAGOGASTRODUODENOSCOPY (EGD) WITH ESOPHAGEAL DILATION N/A 07/25/2012   Procedure: ESOPHAGOGASTRODUODENOSCOPY (EGD) WITH ESOPHAGEAL DILATION;  Surgeon: Rogene Houston, MD;  [facility];  Service: Endoscopy;  Laterality: N/A;  325-rescheduled to Elizabeth notified pt  . ESOPHAGOGASTRODUODENOSCOPY (EGD) WITH PROPOFOL N/A 06/06/2016   Procedure: ESOPHAGOGASTRODUODENOSCOPY (EGD) WITH PROPOFOL;  Surgeon: Rogene Houston, MD;  [facility];  Service: Endoscopy;  Laterality: N/A;  . EYE SURGERY     cataracts removed  .  Foot surgeries Bilateral    hammer toes  . MALONEY DILATION N/A 02/12/2014   Procedure: Venia Minks DILATION;  Surgeon: Rogene Houston, MD;  [facility];  Service: Endoscopy;  Laterality: N/A;  . Right knee arthroscopy     x2  . TONSILLECTOMY    . TOTAL ABDOMINAL HYSTERECTOMY        Current Outpatient  Prescriptions:  .  acyclovir (ZOVIRAX) 400 MG tablet, Take 400 mg by mouth daily., Disp: , Rfl:  .  ALPRAZolam (XANAX) 0.5 MG tablet, Take 0.25-0.5 mg by mouth at bedtime as needed (for sleep.)., Disp: , Rfl:  .  cetirizine (ZYRTEC) 10 MG tablet, Take 10 mg by mouth daily. , Disp: , Rfl:  .  glimepiride (AMARYL) 2 MG tablet, Take 2 mg by mouth 2 (two) times daily. , Disp: , Rfl:  .  metFORMIN (GLUCOPHAGE-XR) 500 MG 24 hr tablet, Take 500 mg by mouth 2 (two) times daily., Disp: , Rfl:  .  methocarbamol (ROBAXIN) 500 MG tablet, Take 500 mg by mouth 2 (two) times daily as needed (for discomfort).,  .  metoprolol (LOPRESSOR) 50 MG tablet, Take 50 mg by mouth daily after breakfast. , Disp: , Rfl:  .  oxybutynin (DITROPAN-XL) 10 MG 24 hr tablet, Take 10 mg by mouth daily., Disp: , Rfl:  .  pantoprazole (PROTONIX) 40 MG tablet, Take 1 tablet (40 mg total) by mouth at bedtime., Disp: 30 tablet, Rfl: 5 .  predniSONE (DELTASONE) 10 MG tablet, Take 10-60 mg by mouth See admin instructions. Take these number of tablets on consecutive days 6-5-4-3-2-1, Disp: , Rfl:  .  rivaroxaban (XARELTO) 20 MG TABS tablet, Take 20 mg by mouth daily with supper., Disp: , Rfl:    Allergies  Allergen Reactions  . Other Anaphylaxis and Other (See Comments)    Antibiotic that started with an "O"-caused anaphylaxis  . Cardizem [Diltiazem] Other (See Comments)    Patient states that she could not think, felt that she was in the twilight zone. Arm discomfort.  . Sulfa Antibiotics Itching    Social History  Substance Use Topics  . Smoking status: Never Smoker  . Smokeless tobacco: Never Used  . Alcohol use No     Comment: socially     Family History  Problem Relation Age of Onset  . Stroke Father   . Diabetes Mother   . Diabetes Sister   . Colon cancer Neg Hx      Review of Systems  Constitutional: Negative.   HENT: Negative.   Eyes: Negative.   Respiratory: Negative.   Cardiovascular: Negative.    Gastrointestinal: Negative.   Genitourinary: Negative for dysuria, flank pain, frequency, hematuria and urgency.       Positive for urinary incontinence  Musculoskeletal: Positive for joint pain and myalgias. Negative for back pain, falls and neck pain.  Skin: Negative.   Neurological: Negative.   Endo/Heme/Allergies: Negative.   Psychiatric/Behavioral: Positive for depression. Negative for hallucinations, memory loss, substance abuse and suicidal ideas. The patient is nervous/anxious. The patient does not have insomnia.     Objective:  Physical Exam  Constitutional: She is oriented to person, place, and time. She appears well-developed and well-nourished. No distress.  HENT:  Head: Normocephalic and atraumatic.  Right Ear: External ear normal.  Left Ear: External ear normal.  Nose: Nose normal.  Mouth/Throat: Oropharynx is clear and moist.  Eyes: Conjunctivae and EOM are normal.  Neck: Normal range of motion. Neck supple.  Cardiovascular: Normal rate,  regular rhythm, normal heart sounds and intact distal pulses.   No murmur heard. Respiratory: Effort normal and breath sounds normal. No respiratory distress. She has no wheezes.  GI: Soft. Bowel sounds are normal. She exhibits no distension. There is no tenderness.  Musculoskeletal:       Right hip: Normal.       Left hip: Normal.       Right knee: She exhibits decreased range of motion and swelling. She exhibits no effusion and no erythema. Tenderness found. Medial joint line and lateral joint line tenderness noted.       Left knee: Normal.  Neurological: She is alert and oriented to person, place, and time. She has normal strength. No sensory deficit.  Skin: No rash noted. She is not diaphoretic. No erythema.  Psychiatric: She has a normal mood and affect. Her behavior is normal.   Vitals Weight: 142 lb Height: 63in Body Surface Area: 1.67 m Body Mass Index: 25.15 kg/m  Pulse: 76 (Regular)  BP: 142/82 (Sitting,  Left Arm, Standard)    Imaging Review Plain radiographs demonstrate severe degenerative joint disease of the right knee(s). The overall alignment ismild varus. The bone quality appears to be good for age and reported activity level.  Assessment/Plan:  End stage primary osteoarthritis, right knee   The patient history, physical examination, clinical judgment of the provider and imaging studies are consistent with end stage degenerative joint disease of the right knee(s) and total knee arthroplasty is deemed medically necessary. The treatment options including medical management, injection therapy arthroscopy and arthroplasty were discussed at length. The risks and benefits of total knee arthroplasty were presented and reviewed. The risks due to aseptic loosening, infection, stiffness, patella tracking problems, thromboembolic complications and other imponderables were discussed. The patient acknowledged the explanation, agreed to proceed with the plan and consent was signed. Patient is being admitted for inpatient treatment for surgery, pain control, PT, OT, prophylactic antibiotics, VTE prophylaxis, progressive ambulation and ADL's and discharge planning. The patient is planning to be discharged home with home health services vs SNF depending ability to make arrangement with family.    PCP: Dr. Woody Seller Cardio: Dr. Mertha Baars Will need SNF unless she can find someone to stay at home with her Therapy Plans: HHPT vs outpatient depening on SNF vs home DME: needs walker and 3-n-1    Ardeen Jourdain, PA-C

## 2017-04-03 ENCOUNTER — Inpatient Hospital Stay (HOSPITAL_COMMUNITY): Payer: Medicare Other | Admitting: Certified Registered Nurse Anesthetist

## 2017-04-03 ENCOUNTER — Inpatient Hospital Stay (HOSPITAL_COMMUNITY)
Admission: RE | Admit: 2017-04-03 | Discharge: 2017-04-06 | DRG: 470 | Disposition: A | Discharge status: Skilled Nursing Facility | Payer: Medicare Other | Source: Ambulatory Visit | Attending: Orthopedic Surgery | Admitting: Orthopedic Surgery

## 2017-04-03 ENCOUNTER — Encounter (HOSPITAL_COMMUNITY)
Admission: RE | Disposition: A | Discharge status: Skilled Nursing Facility | Payer: Self-pay | Source: Ambulatory Visit | Attending: Orthopedic Surgery

## 2017-04-03 ENCOUNTER — Encounter (HOSPITAL_COMMUNITY): Payer: Self-pay

## 2017-04-03 DIAGNOSIS — G8918 Other acute postprocedural pain: Secondary | ICD-10-CM | POA: Diagnosis not present

## 2017-04-03 DIAGNOSIS — Z79899 Other long term (current) drug therapy: Secondary | ICD-10-CM

## 2017-04-03 DIAGNOSIS — Z7984 Long term (current) use of oral hypoglycemic drugs: Secondary | ICD-10-CM | POA: Diagnosis not present

## 2017-04-03 DIAGNOSIS — Z9049 Acquired absence of other specified parts of digestive tract: Secondary | ICD-10-CM | POA: Diagnosis not present

## 2017-04-03 DIAGNOSIS — Z833 Family history of diabetes mellitus: Secondary | ICD-10-CM | POA: Diagnosis not present

## 2017-04-03 DIAGNOSIS — J40 Bronchitis, not specified as acute or chronic: Secondary | ICD-10-CM | POA: Diagnosis not present

## 2017-04-03 DIAGNOSIS — Z882 Allergy status to sulfonamides status: Secondary | ICD-10-CM

## 2017-04-03 DIAGNOSIS — F329 Major depressive disorder, single episode, unspecified: Secondary | ICD-10-CM | POA: Diagnosis present

## 2017-04-03 DIAGNOSIS — Z888 Allergy status to other drugs, medicaments and biological substances status: Secondary | ICD-10-CM

## 2017-04-03 DIAGNOSIS — K219 Gastro-esophageal reflux disease without esophagitis: Secondary | ICD-10-CM | POA: Diagnosis not present

## 2017-04-03 DIAGNOSIS — M21061 Valgus deformity, not elsewhere classified, right knee: Secondary | ICD-10-CM | POA: Diagnosis present

## 2017-04-03 DIAGNOSIS — I4891 Unspecified atrial fibrillation: Secondary | ICD-10-CM | POA: Diagnosis not present

## 2017-04-03 DIAGNOSIS — E119 Type 2 diabetes mellitus without complications: Secondary | ICD-10-CM | POA: Diagnosis present

## 2017-04-03 DIAGNOSIS — Z471 Aftercare following joint replacement surgery: Secondary | ICD-10-CM | POA: Diagnosis not present

## 2017-04-03 DIAGNOSIS — Z9849 Cataract extraction status, unspecified eye: Secondary | ICD-10-CM | POA: Diagnosis not present

## 2017-04-03 DIAGNOSIS — G8911 Acute pain due to trauma: Secondary | ICD-10-CM | POA: Diagnosis not present

## 2017-04-03 DIAGNOSIS — E871 Hypo-osmolality and hyponatremia: Secondary | ICD-10-CM | POA: Diagnosis present

## 2017-04-03 DIAGNOSIS — Z9889 Other specified postprocedural states: Secondary | ICD-10-CM

## 2017-04-03 DIAGNOSIS — Z823 Family history of stroke: Secondary | ICD-10-CM

## 2017-04-03 DIAGNOSIS — Z90710 Acquired absence of both cervix and uterus: Secondary | ICD-10-CM | POA: Diagnosis not present

## 2017-04-03 DIAGNOSIS — E118 Type 2 diabetes mellitus with unspecified complications: Secondary | ICD-10-CM

## 2017-04-03 DIAGNOSIS — Z96651 Presence of right artificial knee joint: Secondary | ICD-10-CM

## 2017-04-03 DIAGNOSIS — R51 Headache: Secondary | ICD-10-CM | POA: Diagnosis not present

## 2017-04-03 DIAGNOSIS — M24561 Contracture, right knee: Secondary | ICD-10-CM | POA: Diagnosis not present

## 2017-04-03 DIAGNOSIS — M1711 Unilateral primary osteoarthritis, right knee: Principal | ICD-10-CM | POA: Diagnosis present

## 2017-04-03 DIAGNOSIS — S8002XA Contusion of left knee, initial encounter: Secondary | ICD-10-CM | POA: Diagnosis not present

## 2017-04-03 HISTORY — PX: TOTAL KNEE ARTHROPLASTY: SHX125

## 2017-04-03 LAB — TYPE AND SCREEN
ABO/RH(D): A POS
Antibody Screen: NEGATIVE

## 2017-04-03 LAB — GLUCOSE, CAPILLARY
Glucose-Capillary: 89 mg/dL (ref 65–99)
Glucose-Capillary: 70 mg/dL (ref 65–99)
Glucose-Capillary: 98 mg/dL (ref 65–99)
Glucose-Capillary: 223 mg/dL — ABNORMAL HIGH (ref 65–99)

## 2017-04-03 SURGERY — ARTHROPLASTY, KNEE, TOTAL
Anesthesia: Spinal | Site: Knee | Laterality: Right

## 2017-04-03 MED ORDER — ACYCLOVIR 400 MG PO TABS
400.0000 mg | ORAL_TABLET | Freq: Every day | ORAL | Status: DC
  Administered 2017-04-03 – 2017-04-06 (×4): 400 mg via ORAL
  Filled 2017-04-03 (×4): qty 1

## 2017-04-03 MED ORDER — HYDROMORPHONE HCL-NACL 0.5-0.9 MG/ML-% IV SOSY: 0.2500 mg | PREFILLED_SYRINGE | INTRAVENOUS | Status: DC | PRN

## 2017-04-03 MED ORDER — SODIUM CHLORIDE 0.9 % IV SOLN
INTRAVENOUS | Status: AC
  Filled 2017-04-03: qty 500000

## 2017-04-03 MED ORDER — SUCCINYLCHOLINE CHLORIDE 200 MG/10ML IV SOSY
PREFILLED_SYRINGE | INTRAVENOUS | Status: AC
  Filled 2017-04-03: qty 10

## 2017-04-03 MED ORDER — FENTANYL CITRATE (PF) 100 MCG/2ML IJ SOLN
INTRAMUSCULAR | Status: AC
  Administered 2017-04-03: 50 ug via INTRAVENOUS
  Filled 2017-04-03: qty 2

## 2017-04-03 MED ORDER — ONDANSETRON HCL 4 MG PO TABS
4.0000 mg | ORAL_TABLET | Freq: Four times a day (QID) | ORAL | Status: DC | PRN
  Administered 2017-04-04 – 2017-04-05 (×2): 4 mg via ORAL
  Filled 2017-04-03 (×2): qty 1

## 2017-04-03 MED ORDER — ALPRAZOLAM 0.25 MG PO TABS
0.2500 mg | ORAL_TABLET | Freq: Every evening | ORAL | Status: DC | PRN
  Administered 2017-04-03 – 2017-04-04 (×2): 0.25 mg via ORAL
  Filled 2017-04-03: qty 2
  Filled 2017-04-03: qty 1

## 2017-04-03 MED ORDER — CEFAZOLIN SODIUM-DEXTROSE 2-4 GM/100ML-% IV SOLN
2.0000 g | INTRAVENOUS | Status: AC
  Administered 2017-04-03: 2 g via INTRAVENOUS
  Filled 2017-04-03: qty 100

## 2017-04-03 MED ORDER — BUPIVACAINE IN DEXTROSE 0.75-8.25 % IT SOLN
INTRATHECAL | Status: DC | PRN
  Administered 2017-04-03: 2 mL via INTRATHECAL

## 2017-04-03 MED ORDER — SODIUM CHLORIDE 0.9 % IJ SOLN
INTRAMUSCULAR | Status: AC
  Filled 2017-04-03: qty 20

## 2017-04-03 MED ORDER — RIVAROXABAN 10 MG PO TABS
10.0000 mg | ORAL_TABLET | Freq: Every day | ORAL | Status: DC
  Administered 2017-04-04 – 2017-04-06 (×3): 10 mg via ORAL
  Filled 2017-04-03 (×3): qty 1

## 2017-04-03 MED ORDER — ONDANSETRON HCL 4 MG/2ML IJ SOLN
INTRAMUSCULAR | Status: DC | PRN
  Administered 2017-04-03: 4 mg via INTRAVENOUS

## 2017-04-03 MED ORDER — FERROUS SULFATE 325 (65 FE) MG PO TABS
325.0000 mg | ORAL_TABLET | Freq: Three times a day (TID) | ORAL | Status: DC
  Administered 2017-04-05 – 2017-04-06 (×4): 325 mg via ORAL
  Filled 2017-04-03 (×5): qty 1

## 2017-04-03 MED ORDER — ROPIVACAINE HCL 5 MG/ML IJ SOLN
INTRAMUSCULAR | Status: DC | PRN
  Administered 2017-04-03: 20 mL via PERINEURAL

## 2017-04-03 MED ORDER — SODIUM CHLORIDE 0.9 % IV SOLN
1000.0000 mg | INTRAVENOUS | Status: AC
  Administered 2017-04-03: 1000 mg via INTRAVENOUS
  Filled 2017-04-03: qty 1100

## 2017-04-03 MED ORDER — MIDAZOLAM HCL 2 MG/2ML IJ SOLN
INTRAMUSCULAR | Status: AC
  Filled 2017-04-03: qty 2

## 2017-04-03 MED ORDER — METHOCARBAMOL 1000 MG/10ML IJ SOLN
500.0000 mg | Freq: Four times a day (QID) | INTRAVENOUS | Status: DC | PRN
  Filled 2017-04-03: qty 5

## 2017-04-03 MED ORDER — MIDAZOLAM HCL 5 MG/5ML IJ SOLN
INTRAMUSCULAR | Status: DC | PRN
  Administered 2017-04-03: 1 mg via INTRAVENOUS

## 2017-04-03 MED ORDER — FENTANYL CITRATE (PF) 100 MCG/2ML IJ SOLN
50.0000 ug | INTRAMUSCULAR | Status: DC | PRN
  Administered 2017-04-03: 50 ug via INTRAVENOUS

## 2017-04-03 MED ORDER — FENTANYL CITRATE (PF) 250 MCG/5ML IJ SOLN
INTRAMUSCULAR | Status: AC
  Filled 2017-04-03: qty 5

## 2017-04-03 MED ORDER — SODIUM CHLORIDE 0.9 % IV SOLN
INTRAVENOUS | Status: DC | PRN
  Administered 2017-04-03: 500 mL

## 2017-04-03 MED ORDER — CEFAZOLIN SODIUM-DEXTROSE 1-4 GM/50ML-% IV SOLN
1.0000 g | Freq: Four times a day (QID) | INTRAVENOUS | Status: AC
  Administered 2017-04-03 (×2): 1 g via INTRAVENOUS
  Filled 2017-04-03 (×2): qty 50

## 2017-04-03 MED ORDER — PHENOL 1.4 % MT LIQD
1.0000 | OROMUCOSAL | Status: DC | PRN
  Filled 2017-04-03: qty 177

## 2017-04-03 MED ORDER — PROMETHAZINE HCL 25 MG/ML IJ SOLN: 6.2500 mg | INTRAMUSCULAR | Status: DC | PRN

## 2017-04-03 MED ORDER — OXYCODONE HCL 5 MG/5ML PO SOLN
5.0000 mg | Freq: Once | ORAL | Status: DC | PRN
  Filled 2017-04-03: qty 5

## 2017-04-03 MED ORDER — LACTATED RINGERS IV SOLN
INTRAVENOUS | Status: DC
  Administered 2017-04-03 (×2): via INTRAVENOUS

## 2017-04-03 MED ORDER — BUPIVACAINE LIPOSOME 1.3 % IJ SUSP
20.0000 mL | Freq: Once | INTRAMUSCULAR | Status: DC
  Filled 2017-04-03: qty 20

## 2017-04-03 MED ORDER — HYDROMORPHONE HCL 2 MG PO TABS
2.0000 mg | ORAL_TABLET | ORAL | Status: DC | PRN
Start: 2017-04-03 — End: 2017-04-06
  Administered 2017-04-03 – 2017-04-06 (×10): 2 mg via ORAL
  Filled 2017-04-03 (×11): qty 1

## 2017-04-03 MED ORDER — PANTOPRAZOLE SODIUM 40 MG PO TBEC
40.0000 mg | DELAYED_RELEASE_TABLET | Freq: Every day | ORAL | Status: DC
  Administered 2017-04-03 – 2017-04-05 (×3): 40 mg via ORAL
  Filled 2017-04-03 (×3): qty 1

## 2017-04-03 MED ORDER — ONDANSETRON HCL 4 MG/2ML IJ SOLN
INTRAMUSCULAR | Status: AC
  Filled 2017-04-03: qty 2

## 2017-04-03 MED ORDER — BUPIVACAINE LIPOSOME 1.3 % IJ SUSP
INTRAMUSCULAR | Status: DC | PRN
  Administered 2017-04-03: 20 mL

## 2017-04-03 MED ORDER — MIDAZOLAM HCL 2 MG/2ML IJ SOLN
1.0000 mg | INTRAMUSCULAR | Status: DC | PRN
  Administered 2017-04-03: 2 mg via INTRAVENOUS

## 2017-04-03 MED ORDER — EPHEDRINE 5 MG/ML INJ
INTRAVENOUS | Status: AC
  Filled 2017-04-03: qty 10

## 2017-04-03 MED ORDER — BISACODYL 5 MG PO TBEC: 5.0000 mg | DELAYED_RELEASE_TABLET | Freq: Every day | ORAL | Status: DC | PRN

## 2017-04-03 MED ORDER — METOPROLOL TARTRATE 50 MG PO TABS
50.0000 mg | ORAL_TABLET | Freq: Every day | ORAL | Status: DC
  Administered 2017-04-04 – 2017-04-06 (×3): 50 mg via ORAL
  Filled 2017-04-03 (×3): qty 1

## 2017-04-03 MED ORDER — ONDANSETRON HCL 4 MG/2ML IJ SOLN
4.0000 mg | Freq: Four times a day (QID) | INTRAMUSCULAR | Status: DC | PRN
  Administered 2017-04-03 – 2017-04-06 (×3): 4 mg via INTRAVENOUS
  Filled 2017-04-03 (×3): qty 2

## 2017-04-03 MED ORDER — EPHEDRINE SULFATE 50 MG/ML IJ SOLN
INTRAMUSCULAR | Status: DC | PRN
  Administered 2017-04-03 (×3): 10 mg via INTRAVENOUS

## 2017-04-03 MED ORDER — MENTHOL 3 MG MT LOZG: 1.0000 | LOZENGE | OROMUCOSAL | Status: DC | PRN

## 2017-04-03 MED ORDER — PROPOFOL 10 MG/ML IV BOLUS
INTRAVENOUS | Status: AC
  Filled 2017-04-03: qty 20

## 2017-04-03 MED ORDER — INSULIN ASPART 100 UNIT/ML ~~LOC~~ SOLN
0.0000 [IU] | Freq: Three times a day (TID) | SUBCUTANEOUS | Status: DC
  Administered 2017-04-04 (×3): 3 [IU] via SUBCUTANEOUS
  Administered 2017-04-05: 13:00:00 2 [IU] via SUBCUTANEOUS
  Administered 2017-04-05: 3 [IU] via SUBCUTANEOUS
  Administered 2017-04-05: 2 [IU] via SUBCUTANEOUS
  Administered 2017-04-06: 13:00:00 3 [IU] via SUBCUTANEOUS

## 2017-04-03 MED ORDER — HYDROMORPHONE HCL-NACL 0.5-0.9 MG/ML-% IV SOSY
0.5000 mg | PREFILLED_SYRINGE | INTRAVENOUS | Status: DC | PRN
  Administered 2017-04-03: 0.5 mg via INTRAVENOUS
  Filled 2017-04-03: qty 1

## 2017-04-03 MED ORDER — FLEET ENEMA 7-19 GM/118ML RE ENEM: 1.0000 | ENEMA | Freq: Once | RECTAL | Status: DC | PRN

## 2017-04-03 MED ORDER — SODIUM CHLORIDE 0.9 % IJ SOLN
INTRAMUSCULAR | Status: DC | PRN
  Administered 2017-04-03: 20 mL

## 2017-04-03 MED ORDER — POLYETHYLENE GLYCOL 3350 17 G PO PACK: 17.0000 g | PACK | Freq: Every day | ORAL | Status: DC | PRN

## 2017-04-03 MED ORDER — CHLORHEXIDINE GLUCONATE 4 % EX LIQD: 60.0000 mL | Freq: Once | CUTANEOUS | Status: DC

## 2017-04-03 MED ORDER — LIDOCAINE 2% (20 MG/ML) 5 ML SYRINGE
INTRAMUSCULAR | Status: AC
  Filled 2017-04-03: qty 5

## 2017-04-03 MED ORDER — METHOCARBAMOL 500 MG PO TABS
500.0000 mg | ORAL_TABLET | Freq: Four times a day (QID) | ORAL | Status: DC | PRN
  Administered 2017-04-03 – 2017-04-06 (×4): 500 mg via ORAL
  Filled 2017-04-03 (×4): qty 1

## 2017-04-03 MED ORDER — MIDAZOLAM HCL 2 MG/2ML IJ SOLN
INTRAMUSCULAR | Status: AC
  Administered 2017-04-03: 2 mg
  Filled 2017-04-03: qty 2

## 2017-04-03 MED ORDER — ALUM & MAG HYDROXIDE-SIMETH 200-200-20 MG/5ML PO SUSP: 30.0000 mL | ORAL | Status: DC | PRN

## 2017-04-03 MED ORDER — ACETAMINOPHEN 650 MG RE SUPP: 650.0000 mg | Freq: Four times a day (QID) | RECTAL | Status: DC | PRN

## 2017-04-03 MED ORDER — LACTATED RINGERS IV SOLN
INTRAVENOUS | Status: DC
  Administered 2017-04-03 – 2017-04-04 (×3): via INTRAVENOUS

## 2017-04-03 MED ORDER — ACETAMINOPHEN 325 MG PO TABS
650.0000 mg | ORAL_TABLET | Freq: Four times a day (QID) | ORAL | Status: DC | PRN
  Administered 2017-04-06: 15:00:00 650 mg via ORAL
  Filled 2017-04-03: qty 2

## 2017-04-03 MED ORDER — OXYBUTYNIN CHLORIDE ER 5 MG PO TB24
10.0000 mg | ORAL_TABLET | Freq: Every day | ORAL | Status: DC
  Administered 2017-04-04 – 2017-04-06 (×3): 10 mg via ORAL
  Filled 2017-04-03 (×3): qty 2

## 2017-04-03 MED ORDER — PROPOFOL 500 MG/50ML IV EMUL
INTRAVENOUS | Status: DC | PRN
  Administered 2017-04-03: 75 ug/kg/min via INTRAVENOUS

## 2017-04-03 MED ORDER — OXYCODONE HCL 5 MG PO TABS: 5.0000 mg | ORAL_TABLET | Freq: Once | ORAL | Status: DC | PRN

## 2017-04-03 MED ORDER — ROCURONIUM BROMIDE 50 MG/5ML IV SOSY
PREFILLED_SYRINGE | INTRAVENOUS | Status: AC
  Filled 2017-04-03: qty 5

## 2017-04-03 SURGICAL SUPPLY — 65 items
AGENT HMST SPONGE THK3/8 (HEMOSTASIS) ×1
BAG DECANTER FOR FLEXI CONT (MISCELLANEOUS) ×1 IMPLANT
BAG SPEC THK2 15X12 ZIP CLS (MISCELLANEOUS)
BAG ZIPLOCK 12X15 (MISCELLANEOUS) IMPLANT
BANDAGE ACE 4X5 VEL STRL LF (GAUZE/BANDAGES/DRESSINGS) ×2 IMPLANT
BANDAGE ACE 6X5 VEL STRL LF (GAUZE/BANDAGES/DRESSINGS) ×2 IMPLANT
BLADE SAG 18X100X1.27 (BLADE) ×2 IMPLANT
BLADE SAW SGTL 11.0X1.19X90.0M (BLADE) ×2 IMPLANT
BNDG GAUZE ELAST 4 BULKY (GAUZE/BANDAGES/DRESSINGS) ×3 IMPLANT
BONE CEMENT GENTAMICIN (Cement) ×4 IMPLANT
CAP KNEE TOTAL 3 SIGMA ×1 IMPLANT
CEMENT BONE GENTAMICIN 40 (Cement) ×2 IMPLANT
COVER SURGICAL LIGHT HANDLE (MISCELLANEOUS) ×2 IMPLANT
CUFF TOURN SGL QUICK 34 (TOURNIQUET CUFF) ×2
CUFF TRNQT CYL 34X4X40X1 (TOURNIQUET CUFF) ×1 IMPLANT
DECANTER SPIKE VIAL GLASS SM (MISCELLANEOUS) ×2 IMPLANT
DRAPE INCISE IOBAN 66X45 STRL (DRAPES) IMPLANT
DRAPE U-SHAPE 47X51 STRL (DRAPES) ×2 IMPLANT
DRSG ADAPTIC 3X8 NADH LF (GAUZE/BANDAGES/DRESSINGS) ×2 IMPLANT
DRSG PAD ABDOMINAL 8X10 ST (GAUZE/BANDAGES/DRESSINGS) ×3 IMPLANT
DURAPREP 26ML APPLICATOR (WOUND CARE) ×2 IMPLANT
ELECT REM PT RETURN 15FT ADLT (MISCELLANEOUS) ×2 IMPLANT
EVACUATOR 1/8 PVC DRAIN (DRAIN) ×2 IMPLANT
FACESHIELD WRAPAROUND (MASK) ×2 IMPLANT
FACESHIELD WRAPAROUND OR TEAM (MASK) ×1 IMPLANT
GAUZE SPONGE 4X4 12PLY STRL (GAUZE/BANDAGES/DRESSINGS) ×2 IMPLANT
GLOVE BIOGEL PI IND STRL 6.5 (GLOVE) ×1 IMPLANT
GLOVE BIOGEL PI IND STRL 8.5 (GLOVE) ×1 IMPLANT
GLOVE BIOGEL PI INDICATOR 6.5 (GLOVE) ×1
GLOVE BIOGEL PI INDICATOR 8.5 (GLOVE) ×1
GLOVE ECLIPSE 8.0 STRL XLNG CF (GLOVE) ×4 IMPLANT
GLOVE SURG SS PI 6.5 STRL IVOR (GLOVE) ×2 IMPLANT
GOWN STRL REUS W/TWL LRG LVL3 (GOWN DISPOSABLE) ×2 IMPLANT
GOWN STRL REUS W/TWL XL LVL3 (GOWN DISPOSABLE) ×2 IMPLANT
HANDPIECE INTERPULSE COAX TIP (DISPOSABLE) ×2
HEMOSTAT SPONGE AVITENE ULTRA (HEMOSTASIS) ×2 IMPLANT
IMMOBILIZER KNEE 20 (SOFTGOODS) ×2
IMMOBILIZER KNEE 20 THIGH 36 (SOFTGOODS) ×1 IMPLANT
MANIFOLD NEPTUNE II (INSTRUMENTS) ×2 IMPLANT
NDL HYPO 21X1.5 SAFETY (NEEDLE) IMPLANT
NEEDLE HYPO 21X1.5 SAFETY (NEEDLE) IMPLANT
NEEDLE HYPO 22GX1.5 SAFETY (NEEDLE) ×1 IMPLANT
NS IRRIG 1000ML POUR BTL (IV SOLUTION) IMPLANT
PACK TOTAL KNEE CUSTOM (KITS) ×2 IMPLANT
PADDING CAST COTTON 6X4 STRL (CAST SUPPLIES) ×2 IMPLANT
PENCIL SMOKE EVAC W/HOLSTER (ELECTROSURGICAL) ×2 IMPLANT
POSITIONER SURGICAL ARM (MISCELLANEOUS) ×2 IMPLANT
SET HNDPC FAN SPRY TIP SCT (DISPOSABLE) ×1 IMPLANT
SET PAD KNEE POSITIONER (MISCELLANEOUS) ×2 IMPLANT
SPONGE LAP 18X18 X RAY DECT (DISPOSABLE) IMPLANT
STRIP CLOSURE SKIN 1/2X4 (GAUZE/BANDAGES/DRESSINGS) ×3 IMPLANT
SUT BONE WAX W31G (SUTURE) IMPLANT
SUT MNCRL AB 4-0 PS2 18 (SUTURE) ×2 IMPLANT
SUT VIC AB 1 CT1 27 (SUTURE) ×4
SUT VIC AB 1 CT1 27XBRD ANTBC (SUTURE) ×2 IMPLANT
SUT VIC AB 2-0 CT1 27 (SUTURE) ×6
SUT VIC AB 2-0 CT1 TAPERPNT 27 (SUTURE) ×3 IMPLANT
SUT VLOC 180 0 24IN GS25 (SUTURE) ×2 IMPLANT
SYR 20CC LL (SYRINGE) ×4 IMPLANT
TOWER CARTRIDGE SMART MIX (DISPOSABLE) ×2 IMPLANT
TRAY FOLEY W/METER SILVER 16FR (SET/KITS/TRAYS/PACK) ×2 IMPLANT
WATER STERILE IRR 1500ML POUR (IV SOLUTION) ×1 IMPLANT
WRAP KNEE MAXI GEL POST OP (GAUZE/BANDAGES/DRESSINGS) ×2 IMPLANT
YANKAUER SUCT BULB TIP 10FT TU (MISCELLANEOUS) ×2 IMPLANT
YANKAUER SUCT BULB TIP NO VENT (SUCTIONS) ×1 IMPLANT

## 2017-04-03 NOTE — Interval H&P Note (Signed)
History and Physical Interval Note:  04/03/2017 9:50 AM  Katrina Castro  has presented today for surgery, with the diagnosis of Osteoarthritis Right knee  The various methods of treatment have been discussed with the patient and family. After consideration of risks, benefits and other options for treatment, the patient has consented to  Procedure(s): RIGHT TOTAL KNEE ARTHROPLASTY (Right) as a surgical intervention .  The patient's history has been reviewed, patient examined, no change in status, stable for surgery.  I have reviewed the patient's chart and labs.  Questions were answered to the patient's satisfaction.     Pleas Carneal A

## 2017-04-03 NOTE — Anesthesia Preprocedure Evaluation (Signed)
Anesthesia Evaluation  Patient identified by MRN, date of birth, ID band Patient awake    Reviewed: Allergy & Precautions, NPO status , Patient's Chart, lab work & pertinent test results, reviewed documented beta blocker date and time   Airway Mallampati: III  TM Distance: >3 FB     Dental  (+) Implants   Pulmonary neg pulmonary ROS,    breath sounds clear to auscultation       Cardiovascular hypertension, Pt. on medications and Pt. on home beta blockers + dysrhythmias Atrial Fibrillation  Rhythm:Regular Rate:Normal     Neuro/Psych  Headaches, Depression    GI/Hepatic GERD  Controlled,  Endo/Other  diabetes, Type 2, Oral Hypoglycemic Agents  Renal/GU      Musculoskeletal  (+) Arthritis , Osteoarthritis,    Abdominal   Peds  Hematology   Anesthesia Other Findings   Reproductive/Obstetrics                             Anesthesia Physical  Anesthesia Plan  ASA: III  Anesthesia Plan: Spinal   Post-op Pain Management:  Regional for Post-op pain   Induction: Intravenous  PONV Risk Score and Plan: 2 and Ondansetron and Midazolam  Airway Management Planned: Simple Face Mask  Additional Equipment:   Intra-op Plan:   Post-operative Plan:   Informed Consent: I have reviewed the patients History and Physical, chart, labs and discussed the procedure including the risks, benefits and alternatives for the proposed anesthesia with the patient or authorized representative who has indicated his/her understanding and acceptance.   Dental advisory given  Plan Discussed with:   Anesthesia Plan Comments:         Anesthesia Quick Evaluation

## 2017-04-03 NOTE — Discharge Instructions (Signed)

## 2017-04-03 NOTE — Brief Op Note (Signed)
04/03/2017  11:43 AM  PATIENT:  Katrina Castro  71 y.o. female  PRE-OPERATIVE DIAGNOSIS:Primary  Osteoarthritis Right knee with Flexion Contracture  POST-OPERATIVE DIAGNOSIS:Primary   Osteoarthritis Right knee with Flexion Contracture  PROCEDURE:  Procedure(s): RIGHT TOTAL KNEE ARTHROPLASTY (Right)  SURGEON:  Surgeon(s) and Role:    Latanya Maudlin, MD - Primary  PHYSICIAN ASSISTANT: Ardeen Jourdain PA  ASSISTANTS: Ardeen Jourdain PA   ANESTHESIA:   spinal and Adductor Block  EBL:  None   BLOOD ADMINISTERED:YES  DRAINS: (One) Hemovact drain(s) in the Right Knee with  Suction Open   LOCAL MEDICATIONS USED:  OTHER 20cc of Exparel with 20cc of Normal Saline  SPECIMEN:  No Specimen  DISPOSITION OF SPECIMEN:  N/A  COUNTS:  YES  TOURNIQUET:  * Missing tourniquet times found for documented tourniquets in log:  [id number] *  DICTATION: .Other Dictation: Dictation Number [id number]  PLAN OF CARE: Admit to inpatient   PATIENT DISPOSITION:  Stable in OR   Delay start of Pharmacological VTE agent (>24hrs) due to surgical blood loss or risk of bleeding: yes

## 2017-04-03 NOTE — Anesthesia Procedure Notes (Signed)
Spinal  Start time: 04/03/2017 10:20 AM End time: 04/03/2017 10:22 AM Staffing Anesthesiologist: Candida Peeling RAY Performed: anesthesiologist  Preanesthetic Checklist Completed: patient identified, site marked, surgical consent, pre-op evaluation, timeout performed, IV checked, risks and benefits discussed and monitors and equipment checked Spinal Block Patient position: sitting Prep: Betadine and site prepped and draped Patient monitoring: heart rate, cardiac monitor, continuous pulse ox and blood pressure Approach: midline Location: L3-4 Injection technique: single-shot Needle Needle type: Tuohy  Needle gauge: 22 G Needle length: 9 cm

## 2017-04-03 NOTE — Anesthesia Postprocedure Evaluation (Signed)
Anesthesia Post Note  Patient: [name]  Procedure(s) Performed: RIGHT TOTAL KNEE ARTHROPLASTY (Right Knee)     Patient location during evaluation: PACU Anesthesia Type: Spinal Level of consciousness: oriented and awake and alert Pain management: pain level controlled Vital Signs Assessment: post-procedure vital signs reviewed and stable Respiratory status: spontaneous breathing and respiratory function stable Cardiovascular status: blood pressure returned to baseline and stable Postop Assessment: no headache, no backache and no apparent nausea or vomiting Anesthetic complications: no    Last Vitals:  Vitals:   04/03/17 1300 04/03/17 1315  BP: (!) 101/57   Pulse: 65   Resp: 13   Temp:  36.6 C  SpO2: 98%     Last Pain:  Vitals:   04/03/17 1315  PainSc: 0-No pain                 [name]

## 2017-04-03 NOTE — Op Note (Signed)
NAMELILLYTH, SPONG              ACCOUNT NO.:  [id number]  MEDICAL RECORD NO.:  [id number]  LOCATION:                                 FACILITY:  PHYSICIAN:  Kipp Brood. Kalika Smay, M.D.DATE OF BIRTH:  [birth year]  DATE OF PROCEDURE:  04/03/2017 DATE OF DISCHARGE:                              OPERATIVE REPORT   SURGEON:  Kipp Brood. Gladstone Lighter, M.D.  ASSISTANT:  Ardeen Jourdain, [place].  PREOPERATIVE DIAGNOSES: 1. Bone-on-bone severe primary osteoarthritis with a valgus deformity,     right knee. 2. Flexion contractures, right knee.  POSTOPERATIVE DIAGNOSES: 1. Bone-on-bone severe primary osteoarthritis with a valgus deformity,     right knee. 2. Flexion contractures, right knee.  OPERATION: 1. Release of flexion contractures. 2. Cemented DePuy total knee prosthesis with gentamicin in the cement.     The sizes used was a size 2 femoral component, right posterior     cruciate sacrificing type.  The tray was a size 2.5, the insert was     a size 2, 10-mm thickness rotating platform insert.  The patella     was a size 38 with 3 pegs.  DESCRIPTION OF PROCEDURE:  Under spinal anesthesia, routine orthopedic prep and draping, the right lower extremity was carried out.  I marked the appropriate right leg in the holding area.  At that time, I also went through the appropriate time-out in the operating room.  After all this was completed, the leg was exsanguinated with Esmarch.  Tourniquet was elevated at 325 mmHg.  The right knee then was flexed, placed in a DeMayo knee holder.  Anterior approach to the knee was carried out. Bleeders were identified and cauterized.  I then created a median parapatellar incision, reflected the patella laterally.  I did medial and lateral meniscectomies and excised the anterior and posterior cruciate ligaments.  At this particular time, we then made our initial drill hole in the intercondylar notch of the femur.  The guide rod was inserted to make sure we were  up at the appropriate area.  We then removed that, irrigated out the canal and then removed 11-mm thickness off the distal femur with the appropriate jig.  Following that, we measured the femur to be a size 2.  We did anterior, posterior and chamfering cuts for a size 2 right femoral component.  Next, attention was directed to the tibia.  We utilized the external guide on the tibia. We removed 4-mm thickness off the medial side as a guide.  At this point, we then inserted our lamina spreaders, we had no spurs.  After all the meniscectomies were completed, we then inserted our spacer block for a size 10, had an excellent fit.  Following that, we then proceeded to do our keel cut of the proximal tibial plateau in usual fashion.  We then did our distal femoral cut in the usual fashion.  We then inserted the trial components, reduced the knee and had excellent function with a 10-mm thickness insert.  I then did a resurfacing procedure on the patella.  We measured the patella to be a size 38.  Three drill holes were made in the patella.  We then inserted our  trial component, we had an excellent fit.  At this point, all trial components were removed.  We thoroughly water picked out the knee, cemented all 3 components in simultaneously with gentamicin in the cement.  Once the cement was hardened, we removed all loose pieces of cement, we irrigated out the knee again and then inserted our permanent rotating platform size 2 tibial component.  We reduced the knee and had excellent function.  I then irrigated the knee again with antibiotic solution.  I inserted a Hemovac drain and closed the wound layers in usual fashion.  Sterile dressings were applied.  The patient had 2 g of IV Ancef.  Note, she did have an adductor block.  She has spinal anesthesia.  She was given 2 g of IV Ancef and she had 1 g of tranexamic acid IV.          ______________________________ Kipp Brood. Gladstone Lighter,  M.D.     RAG/MEDQ  D:  04/03/2017  T:  04/03/2017  Job:  [id number]

## 2017-04-03 NOTE — Progress Notes (Signed)
AssistedDr. Miller with right, ultrasound guided, adductor canal block. Side rails up, monitors on throughout procedure. See vital signs in flow sheet. Tolerated Procedure well.  

## 2017-04-03 NOTE — Anesthesia Procedure Notes (Signed)
Anesthesia Regional Block: Adductor canal block   Pre-Anesthetic Checklist: ,, timeout performed, Correct Patient, Correct Site, Correct Laterality, Correct Procedure, Correct Position, site marked, Risks and benefits discussed,  Surgical consent,  Pre-op evaluation,  At surgeon's request and post-op pain management  Laterality: Right  Prep: chloraprep       Needles:  Injection technique: Single-shot  Needle Type: Stimiplex     Needle Length: 9cm  Needle Gauge: 21     Additional Needles:   Procedures:,,,, ultrasound used (permanent image in chart),,,,  Narrative:  Start time: 04/03/2017 10:08 AM End time: 04/03/2017 10:13 AM Injection made incrementally with aspirations every 5 mL.  Performed by: Personally  Anesthesiologist: Candida Peeling RAY

## 2017-04-03 NOTE — Transfer of Care (Signed)
Immediate Anesthesia Transfer of Care Note  Patient: Katrina Castro  Procedure(s) Performed: Procedure(s): RIGHT TOTAL KNEE ARTHROPLASTY (Right)  Patient Location: PACU  Anesthesia Type:Spinal  Level of Consciousness:  sedated, patient cooperative and responds to stimulation  Airway & Oxygen Therapy:Patient Spontanous Breathing and Patient connected to face mask oxgen  Post-op Assessment:  Report given to PACU RN and Post -op Vital signs reviewed and stable  Post vital signs:  Reviewed and stable  Last Vitals:  Vitals:   04/03/17 1012 04/03/17 1013  BP:  112/66  Pulse: 60 71  Resp: 15 16  SpO2: 57% 47%    Complications: No apparent anesthesia complications

## 2017-04-04 LAB — BASIC METABOLIC PANEL
Anion gap: 9 (ref 5–15)
BUN: 7 mg/dL (ref 6–20)
CO2: 24 mmol/L (ref 22–32)
Calcium: 8.9 mg/dL (ref 8.9–10.3)
Chloride: 98 mmol/L — ABNORMAL LOW (ref 101–111)
Creatinine, Ser: 0.59 mg/dL (ref 0.44–1.00)
GFR calc Af Amer: >60 mL/min (ref >60)
GFR calc non Af Amer: >60 mL/min (ref >60)
Glucose, Bld: 216 mg/dL — ABNORMAL HIGH (ref 65–99)
Potassium: 3.7 mmol/L (ref 3.5–5.1)
Sodium: 131 mmol/L — ABNORMAL LOW (ref 135–145)

## 2017-04-04 LAB — GLUCOSE, CAPILLARY
Glucose-Capillary: 195 mg/dL — ABNORMAL HIGH (ref 65–99)
Glucose-Capillary: 200 mg/dL — ABNORMAL HIGH (ref 65–99)
Glucose-Capillary: 172 mg/dL — ABNORMAL HIGH (ref 65–99)
Glucose-Capillary: 179 mg/dL — ABNORMAL HIGH (ref 65–99)
Glucose-Capillary: 169 mg/dL — ABNORMAL HIGH (ref 65–99)
Glucose-Capillary: 162 mg/dL — ABNORMAL HIGH (ref 65–99)

## 2017-04-04 LAB — CBC
HCT: 37.7 % (ref 36.0–46.0)
Hemoglobin: 12.4 g/dL (ref 12.0–15.0)
MCH: 28.4 pg (ref 26.0–34.0)
MCHC: 32.9 g/dL (ref 30.0–36.0)
MCV: 86.5 fL (ref 78.0–100.0)
Platelets: 311 10*3/uL (ref 150–400)
RBC: 4.36 MIL/uL (ref 3.87–5.11)
RDW: 13.3 % (ref 11.5–15.5)
WBC: 15.2 10*3/uL — ABNORMAL HIGH (ref 4.0–10.5)

## 2017-04-04 MED ORDER — POLYETHYLENE GLYCOL 3350 17 G PO PACK: 17.0000 g | PACK | Freq: Every day | ORAL | 0 refills | Status: DC | PRN

## 2017-04-04 MED ORDER — BISACODYL 5 MG PO TBEC: 5.0000 mg | DELAYED_RELEASE_TABLET | Freq: Every day | ORAL | 0 refills | Status: DC | PRN

## 2017-04-04 MED ORDER — METHOCARBAMOL 500 MG PO TABS: 500.0000 mg | ORAL_TABLET | Freq: Four times a day (QID) | ORAL | 0 refills | Status: DC | PRN

## 2017-04-04 MED ORDER — HYDROMORPHONE HCL 2 MG PO TABS: 2.0000 mg | ORAL_TABLET | ORAL | 0 refills | Status: DC | PRN

## 2017-04-04 NOTE — Progress Notes (Signed)
Subjective: 1 Day Post-Op Procedure(s) (LRB): RIGHT TOTAL KNEE ARTHROPLASTY (Right) Patient reports pain as 4 on 0-10 scale.  Doingf very well. Hemovac DCd and wound looks fine.  Objective: Vital signs in last 24 hours: Temp:  [97.8 F (36.6 C)-98.6 F (37 C)] 98.6 F (37 C) (10/18 0511) Pulse Rate:  [51-100] 100 (10/18 0511) Resp:  [8-27] 19 (10/18 0511) BP: (91-171)/(38-88) 161/68 (10/18 0543) SpO2:  [93 %-100 %] 97 % (10/18 0511) Weight:  [64 kg (141 lb)] 64 kg (141 lb) (10/17 1400)  Intake/Output from previous day: 10/17 0701 - 10/18 0700 In: 3680 [P.O.:600; I.V.:2920; IV Piggyback:160] Out: 2515 [Urine:2475; Drains:15; Blood:25] Intake/Output this shift: No intake/output data recorded.   Recent Labs  04/04/17 0615  HGB 12.4    Recent Labs  04/04/17 0615  WBC 15.2*  RBC 4.36  HCT 37.7  PLT 311    Recent Labs  04/04/17 0615  NA 131*  K 3.7  CL 98*  CO2 24  BUN 7  CREATININE 0.59  GLUCOSE 216*  CALCIUM 8.9   No results for input(s): LABPT, INR in the last 72 hours.  Neurologically intact Dorsiflexion/Plantar flexion intact No cellulitis present  Assessment/Plan: 1 Day Post-Op Procedure(s) (LRB): RIGHT TOTAL KNEE ARTHROPLASTY (Right) Up with therapy Plan   on SNF tomorrow.  Rella Egelston A 04/04/2017, 7:16 AM

## 2017-04-04 NOTE — Progress Notes (Signed)
OT Cancellation Note  Patient Details Name: Katrina Castro MRN: [id number] DOB: [birth year]   Cancelled Treatment:    Reason Eval/Treat Not Completed: Other (comment).  Pt with N/V earlier. Back to bed with nursing. Will check back tomorrow.  Jakaiden Fill 04/04/2017, 1:44 PM  Lesle Chris, OTR/L [phone] 04/04/2017

## 2017-04-04 NOTE — Evaluation (Signed)
Physical Therapy Evaluation Patient Details Name: Katrina Castro MRN: [id number] DOB: [birth year] Today's Date: 04/04/2017   History of Present Illness  Pt s/p R TKR and with hx of DM, a-fib and back surgery x2  Clinical Impression  Pt s/p R TKR and presents with decreased R LE strength/ROM, post op pain, and elevated anxiety level limiting functional mobility.  Pt would benefit from follow up rehab at SNF level to maximize IND and safety prior to return home alone.    Follow Up Recommendations SNF    Equipment Recommendations  None recommended by PT    Recommendations for Other Services OT consult     Precautions / Restrictions Precautions Precautions: Knee;Fall Required Braces or Orthoses: Knee Immobilizer - Right Knee Immobilizer - Right: Discontinue once straight leg raise with < 10 degree lag Restrictions Weight Bearing Restrictions: No Other Position/Activity Restrictions: WBAT      Mobility  Bed Mobility Overal bed mobility: Needs Assistance Bed Mobility: Supine to Sit     Supine to sit: Min assist;Mod assist;+2 for physical assistance;+2 for safety/equipment     General bed mobility comments: cues for sequence and use of L LE to self assist  Transfers Overall transfer level: Needs assistance Equipment used: Rolling walker (2 wheeled) Transfers: Sit to/from Stand Sit to Stand: Min assist;Mod assist;+2 physical assistance;+2 safety/equipment         General transfer comment: cues for LE management and use of UEs to self assist  Ambulation/Gait Ambulation/Gait assistance: Min assist;Mod assist;+2 physical assistance;+2 safety/equipment Ambulation Distance (Feet): 4 Feet Assistive device: Rolling walker (2 wheeled) Gait Pattern/deviations: Step-to pattern;Decreased step length - right;Decreased step length - left;Shuffle;Trunk flexed Gait velocity: decr Gait velocity interpretation: Below normal speed for age/gender General Gait Details: cues for  sequence, posture and position from [employer]            Wheelchair Mobility    Modified Rankin (Stroke Patients Only)       Balance Overall balance assessment: Needs assistance Sitting-balance support: Feet supported;No upper extremity supported Sitting balance-Leahy Scale: Fair     Standing balance support: Bilateral upper extremity supported Standing balance-Leahy Scale: Poor                               Pertinent Vitals/Pain Pain Assessment: Faces Faces Pain Scale: Hurts even more Pain Location: R knee Pain Descriptors / Indicators: Sore;Grimacing;Guarding Pain Intervention(s): Limited activity within patient's tolerance;Monitored during session;Premedicated before session;Ice applied    Home Living Family/patient expects to be discharged to:: Skilled nursing facility Living Arrangements: Alone                    Prior Function Level of Independence: Independent               Hand Dominance        Extremity/Trunk Assessment   Upper Extremity Assessment Upper Extremity Assessment: Overall WFL for tasks assessed    Lower Extremity Assessment Lower Extremity Assessment: RLE deficits/detail       Communication   Communication: No difficulties  Cognition Arousal/Alertness: Awake/alert Behavior During Therapy: Impulsive Overall Cognitive Status: No family/caregiver present to determine baseline cognitive functioning                                 General Comments: Pt with difficulty following cues and focusing on task  General Comments      Exercises     Assessment/Plan    PT Assessment Patient needs continued PT services  PT Problem List Decreased strength;Decreased range of motion;Decreased activity tolerance;Decreased mobility;Decreased knowledge of use of DME;Decreased balance;Pain       PT Treatment Interventions DME instruction;Gait training;Stair training;Functional mobility  training;Therapeutic activities;Therapeutic exercise;Patient/family education    PT Goals (Current goals can be found in the Care Plan section)  Acute Rehab PT Goals Patient Stated Goal: Less pain PT Goal Formulation: With patient Time For Goal Achievement: 04/09/17 Potential to Achieve Goals: Fair    Frequency 7X/week   Barriers to discharge        Co-evaluation               AM-PAC PT "6 Clicks" Daily Activity  Outcome Measure Difficulty turning over in bed (including adjusting bedclothes, sheets and blankets)?: Unable Difficulty moving from lying on back to sitting on the side of the bed? : Unable Difficulty sitting down on and standing up from a chair with arms (e.g., wheelchair, bedside commode, etc,.)?: Unable Help needed moving to and from a bed to chair (including a wheelchair)?: A Lot Help needed walking in hospital room?: A Lot Help needed climbing 3-5 steps with a railing? : A Lot 6 Click Score: 9    End of Session Equipment Utilized During Treatment: Gait belt;Right knee immobilizer Activity Tolerance: Patient limited by fatigue;Patient limited by pain;Other (comment) (anxiety) Patient left: in chair;with call bell/phone within reach;with chair alarm set Nurse Communication: Mobility status PT Visit Diagnosis: Pain;Difficulty in walking, not elsewhere classified (R26.2) Pain - Right/Left: Right Pain - part of body: Knee    Time: 1030-1054 PT Time Calculation (min) (ACUTE ONLY): 24 min   Charges:   PT Evaluation $PT Eval Low Complexity: 1 Low PT Treatments $Therapeutic Activity: 8-22 mins   PT G Codes:        Pg [phone]   Najai Waszak 04/04/2017, 1:04 PM

## 2017-04-04 NOTE — NC FL2 (Signed)
[place] LEVEL OF CARE SCREENING TOOL     IDENTIFICATION  Patient Name: Katrina Castro Birthdate: 11/07/1945 Sex: female Admission Date (Current Location): 04/03/2017  [facility] and [place] Number:  Engineer, manufacturing systems and Address:  [facility],  [place] [address], [place]      Provider Number: [id number]  Attending Physician Name and Address:  Latanya Maudlin, MD  Relative Name and Phone Number:       Current Level of Care: Hospital Recommended Level of Care: [place] Prior Approval Number:    Date Approved/Denied:   PASRR Number: [id number]  Discharge Plan: SNF    Current Diagnoses: Patient Active Problem List   Diagnosis Date Noted  . Hx of total knee arthroplasty, right 04/03/2017  . Other dysphagia 05/30/2016  . New onset a-fib ([place]) 05/18/2016  . Hypokalemia 05/18/2016  . Diabetes ([place]) 05/18/2016  . Atrial fibrillation ([place]) 05/18/2016  . Achalasia 05/15/2016  . Migraine without aura and without status migrainosus, not intractable 05/10/2015  . GERD (gastroesophageal reflux disease) 01/07/2012  . Dysphagia 11/26/2011    Orientation RESPIRATION BLADDER Height & Weight     Self, Time, Situation, Place  Normal Continent Weight: 141 lb (64 kg) Height:  5\' 3"  (160 cm)  BEHAVIORAL SYMPTOMS/MOOD NEUROLOGICAL BOWEL NUTRITION STATUS      Continent Diet (Low Sodium Heart Healthy )  AMBULATORY STATUS COMMUNICATION OF NEEDS Skin   Extensive Assist Verbally  (Incision Left Knee)                       Personal Care Assistance Level of Assistance  Bathing, Feeding, Dressing Bathing Assistance: Limited assistance Feeding assistance: Independent Dressing Assistance: Limited assistance     Functional Limitations Info  Sight, Hearing, Speech Sight Info: Adequate Hearing Info: Adequate Speech Info: Adequate    SPECIAL CARE FACTORS FREQUENCY  PT (By licensed PT), OT (By licensed OT)     PT  Frequency: 7x/week OT Frequency: 7x/week            Contractures Contractures Info: Not present    Additional Factors Info  Code Status, Allergies Code Status Info: Fullcode Allergies Info: Levofloxacin, Cardizem Diltiazem, Beef-derived Products, Sulfa Antibiotics, Codeine Sulfate, Diazepam           Current Medications (04/04/2017):  This is the current hospital active medication list Current Facility-Administered Medications  Medication Dose Route Frequency Provider Last Rate Last Dose  . acetaminophen (TYLENOL) tablet 650 mg  650 mg Oral Q6H PRN Latanya Maudlin, MD       Or  . acetaminophen (TYLENOL) suppository 650 mg  650 mg Rectal Q6H PRN Latanya Maudlin, MD      . acyclovir (ZOVIRAX) tablet 400 mg  400 mg Oral Daily Latanya Maudlin, MD   400 mg at 04/03/17 1758  . ALPRAZolam Duanne Moron) tablet 0.25-0.5 mg  0.25-0.5 mg Oral QHS PRN Latanya Maudlin, MD   0.25 mg at 04/03/17 2121  . alum & mag hydroxide-simeth (MAALOX/MYLANTA) 200-200-20 MG/5ML suspension 30 mL  30 mL Oral Q4H PRN Latanya Maudlin, MD      . bisacodyl (DULCOLAX) EC tablet 5 mg  5 mg Oral Daily PRN Latanya Maudlin, MD      . bupivacaine liposome (EXPAREL) 1.3 % injection 266 mg  20 mL Infiltration Once [place], [employer], PA-C      . ferrous sulfate tablet 325 mg  325 mg Oral TID PC Latanya Maudlin, MD      . HYDROmorphone (  DILAUDID) injection 0.5 mg  0.5 mg Intravenous Q2H PRN Latanya Maudlin, MD   0.5 mg at 04/03/17 1548  . HYDROmorphone (DILAUDID) tablet 2 mg  2 mg Oral Q3H PRN Latanya Maudlin, MD   2 mg at 04/04/17 0837  . insulin aspart (novoLOG) injection 0-15 Units  0-15 Units Subcutaneous TID WC Latanya Maudlin, MD   3 Units at 04/04/17 0825  . lactated ringers infusion   Intravenous Continuous Latanya Maudlin, MD 100 mL/hr at 04/03/17 1548    . menthol-cetylpyridinium (CEPACOL) lozenge 3 mg  1 lozenge Oral PRN Latanya Maudlin, MD       Or  . phenol (CHLORASEPTIC) mouth spray 1 spray  1 spray Mouth/Throat PRN  Latanya Maudlin, MD      . methocarbamol (ROBAXIN) tablet 500 mg  500 mg Oral Q6H PRN Latanya Maudlin, MD   500 mg at 04/04/17 0440   Or  . methocarbamol (ROBAXIN) 500 mg in dextrose 5 % 50 mL IVPB  500 mg Intravenous Q6H PRN Latanya Maudlin, MD      . metoprolol tartrate (LOPRESSOR) tablet 50 mg  50 mg Oral QPC breakfast Latanya Maudlin, MD   50 mg at 04/04/17 0825  . ondansetron (ZOFRAN) tablet 4 mg  4 mg Oral Q6H PRN Latanya Maudlin, MD       Or  . ondansetron [facility]) injection 4 mg  4 mg Intravenous Q6H PRN Latanya Maudlin, MD   4 mg at 04/04/17 0735  . oxybutynin (DITROPAN-XL) 24 hr tablet 10 mg  10 mg Oral Daily Latanya Maudlin, MD      . pantoprazole (PROTONIX) EC tablet 40 mg  40 mg Oral QHS Latanya Maudlin, MD   40 mg at 04/03/17 2122  . polyethylene glycol (MIRALAX / GLYCOLAX) packet 17 g  17 g Oral Daily PRN Latanya Maudlin, MD      . rivaroxaban Al[facility]) tablet 10 mg  10 mg Oral Q breakfast Latanya Maudlin, MD   10 mg at 04/04/17 0825  . sodium phosphate (FLEET) 7-19 GM/118ML enema 1 enema  1 enema Rectal Once PRN Latanya Maudlin, MD         Discharge Medications: Please see discharge summary for a list of discharge medications.  Relevant Imaging Results:  Relevant Lab Results:   Additional Information LHT:[id number]  Lia Hopping, LCSW

## 2017-04-04 NOTE — Clinical Social Work Note (Signed)
Clinical Social Work Assessment  Patient Details  Name: Katrina Castro MRN: [id number] Date of Birth: 1945-07-15  Date of referral:  04/04/17               Reason for consult:  Facility Placement                Permission sought to share information with:  Chartered certified accountant granted to share information::  Yes, Verbal Permission Granted  Name::        Agency::     Relationship::     Contact Information:     Housing/Transportation Living arrangements for the past 2 months:  Single Family Home Source of Information:  Patient Patient Interpreter Needed:  None Criminal Activity/Legal Involvement Pertinent to Current Situation/Hospitalization:  No - Comment as needed Significant Relationships:  Adult Children Lives with:  Self Do you feel safe going back to the place where you live?  Yes Need for family participation in patient care:  Yes (Comment)  Care giving concerns:  SNF Placement for ST rehab  Social Worker assessment / plan:  CSW met with the patient at bedside, explain role and reason for visit-to assist with discharge planning to [place] rehab. The patient reports she lives home alone and her family does not live close. She prefers to go to a facility in [place] in [place], [place].  CSW explain faxing out process, and later providing bed offers in [place], if [place] did not have beds at the time of her discharge. CSW explain 3 night inpatient stay for medicare to pay for SNF stay. Patient report understanding.   Plan: Assist with discharge to SNF.  Employment status:  Retired Forensic scientist:  Medicare PT Recommendations:  [place] / Referral to community resources:  [place]  Patient/Family's Response to care:  Agreeable to care, reports she is very tired and would like to get some rest. She report it has been hard to do since she is awaken by medical staff. No family at bedside.    Patient/Family's Understanding of and Emotional Response to Diagnosis, Current Treatment, and Prognosis: " I drive and take care of myself so, I want to go to [place] for a few days  to strengthen my knee before going home."  Emotional Assessment Appearance:  Appears stated age Attitude/Demeanor/Rapport:    Affect (typically observed):  Calm, Hopeful Orientation:  Oriented to Self, Oriented to Place, Oriented to  Time, Oriented to Situation Alcohol / Substance use:  Not Applicable Psych involvement (Current and /or in the community):  No (Comment)  Discharge Needs  Concerns to be addressed:  Discharge Planning Concerns, Care Coordination Readmission within the last 30 days:  No Current discharge risk:  None Barriers to Discharge:  Continued Medical Work up   [employer], [place] 04/04/2017, 10:44 AM

## 2017-04-04 NOTE — Progress Notes (Signed)
Physical Therapy Treatment Patient Details Name: INDIE BOEHNE MRN: [id number] DOB: 1946-02-28 Today's Date: 04/04/2017    History of Present Illness Pt s/p R TKR and with hx of DM, a-fib and back surgery x2    PT Comments    Pt continues to c/o pain but agreeable to therex in bed.  Pt requiring constant input to focus on task and participate.   Follow Up Recommendations  SNF     Equipment Recommendations  None recommended by PT    Recommendations for Other Services OT consult     Precautions / Restrictions Precautions Precautions: Knee;Fall Required Braces or Orthoses: Knee Immobilizer - Right Knee Immobilizer - Right: Discontinue once straight leg raise with < 10 degree lag Restrictions Weight Bearing Restrictions: No Other Position/Activity Restrictions: WBAT    Mobility  Bed Mobility                  Transfers                    Ambulation/Gait                 Stairs            Wheelchair Mobility    Modified Rankin (Stroke Patients Only)       Balance                                            Cognition Arousal/Alertness: Awake/alert Behavior During Therapy: Impulsive Overall Cognitive Status: No family/caregiver present to determine baseline cognitive functioning                                 General Comments: Pt with difficulty following cues and focusing on task      Exercises Total Joint Exercises Ankle Circles/Pumps: AROM;Both;20 reps;Supine Quad Sets: AROM;Both;Supine;10 reps Heel Slides: AAROM;Right;15 reps;Supine Straight Leg Raises: AAROM;10 reps;Supine;Right    General Comments        Pertinent Vitals/Pain Pain Assessment: Faces Faces Pain Scale: Hurts even more Pain Location: R knee Pain Descriptors / Indicators: Sore;Grimacing;Guarding Pain Intervention(s): Limited activity within patient's tolerance;Monitored during session;Premedicated before session;Ice  applied    Home Living                      Prior Function            PT Goals (current goals can now be found in the care plan section) Acute Rehab PT Goals Patient Stated Goal: Less pain PT Goal Formulation: With patient Time For Goal Achievement: 04/09/17 Potential to Achieve Goals: Fair Progress towards PT goals: Progressing toward goals    Frequency    7X/week      PT Plan Current plan remains appropriate    Co-evaluation              AM-PAC PT "6 Clicks" Daily Activity  Outcome Measure  Difficulty turning over in bed (including adjusting bedclothes, sheets and blankets)?: Unable Difficulty moving from lying on back to sitting on the side of the bed? : Unable Difficulty sitting down on and standing up from a chair with arms (e.g., wheelchair, bedside commode, etc,.)?: Unable Help needed moving to and from a bed to chair (including a wheelchair)?: A Lot Help needed walking in hospital room?: A Lot Help needed  climbing 3-5 steps with a railing? : Total 6 Click Score: 8    End of Session Equipment Utilized During Treatment: Gait belt;Right knee immobilizer Activity Tolerance: Patient limited by fatigue;Patient limited by pain;Other (comment) Patient left: in bed;with call bell/phone within reach Nurse Communication: Mobility status PT Visit Diagnosis: Pain;Difficulty in walking, not elsewhere classified (R26.2) Pain - Right/Left: Right Pain - part of body: Knee     Time: 1635-1650 PT Time Calculation (min) (ACUTE ONLY): 15 min  Charges:  $Therapeutic Exercise: 23-37 mins                    G Codes:       Pg [phone]    Kevonte Vanecek 04/04/2017, 5:44 PM

## 2017-04-05 LAB — CBC
HCT: 37.9 % (ref 36.0–46.0)
Hemoglobin: 13.1 g/dL (ref 12.0–15.0)
MCH: 29.4 pg (ref 26.0–34.0)
MCHC: 34.6 g/dL (ref 30.0–36.0)
MCV: 85.2 fL (ref 78.0–100.0)
Platelets: 409 10*3/uL — ABNORMAL HIGH (ref 150–400)
RBC: 4.45 MIL/uL (ref 3.87–5.11)
RDW: 13.3 % (ref 11.5–15.5)
WBC: 21.1 10*3/uL — ABNORMAL HIGH (ref 4.0–10.5)

## 2017-04-05 LAB — BASIC METABOLIC PANEL
Anion gap: 10 (ref 5–15)
BUN: 8 mg/dL (ref 6–20)
CO2: 24 mmol/L (ref 22–32)
Calcium: 9.4 mg/dL (ref 8.9–10.3)
Chloride: 91 mmol/L — ABNORMAL LOW (ref 101–111)
Creatinine, Ser: 0.63 mg/dL (ref 0.44–1.00)
GFR calc Af Amer: >60 mL/min (ref >60)
GFR calc non Af Amer: >60 mL/min (ref >60)
Glucose, Bld: 178 mg/dL — ABNORMAL HIGH (ref 65–99)
Potassium: 3.6 mmol/L (ref 3.5–5.1)
Sodium: 125 mmol/L — ABNORMAL LOW (ref 135–145)

## 2017-04-05 LAB — GLUCOSE, CAPILLARY
Glucose-Capillary: 156 mg/dL — ABNORMAL HIGH (ref 65–99)
Glucose-Capillary: 128 mg/dL — ABNORMAL HIGH (ref 65–99)
Glucose-Capillary: 131 mg/dL — ABNORMAL HIGH (ref 65–99)
Glucose-Capillary: 112 mg/dL — ABNORMAL HIGH (ref 65–99)

## 2017-04-05 MED ORDER — FUROSEMIDE 40 MG PO TABS
40.0000 mg | ORAL_TABLET | Freq: Once | ORAL | Status: AC
  Administered 2017-04-05: 40 mg via ORAL
  Filled 2017-04-05: qty 1

## 2017-04-05 MED ORDER — POTASSIUM CHLORIDE CRYS ER 20 MEQ PO TBCR
20.0000 meq | EXTENDED_RELEASE_TABLET | Freq: Two times a day (BID) | ORAL | Status: AC
  Administered 2017-04-05 (×2): 20 meq via ORAL
  Filled 2017-04-05 (×2): qty 1

## 2017-04-05 NOTE — Progress Notes (Signed)
Physical Therapy Treatment Patient Details Name: Katrina Castro MRN: [id number] DOB: [birth year] Today's Date: 04/05/2017    History of Present Illness Pt s/p R TKR and with hx of DM, a-fib and back surgery x2    PT Comments    Pt continues impulsive and unsafe with all mobility tasks but is slowly making progress with all mobility tasks. This am, pt stand pvt with RW to [facility] (incontinent of urine enroute) and ambulated short distance in hall.  Follow Up Recommendations  SNF     Equipment Recommendations  None recommended by PT    Recommendations for Other Services OT consult     Precautions / Restrictions Precautions Precautions: Knee;Fall Required Braces or Orthoses: Knee Immobilizer - Right Knee Immobilizer - Right: Discontinue once straight leg raise with < 10 degree lag Restrictions Weight Bearing Restrictions: No Other Position/Activity Restrictions: WBAT    Mobility  Bed Mobility Overal bed mobility: Needs Assistance Bed Mobility: Supine to Sit     Supine to sit: Min assist;Mod assist     General bed mobility comments: cues for sequence and use of L LE to self assist  Transfers Overall transfer level: Needs assistance Equipment used: Rolling walker (2 wheeled) Transfers: Sit to/from Stand Sit to Stand: Min assist;Mod assist;+2 physical assistance;+2 safety/equipment Stand pivot transfers: Min assist;Mod assist;+2 safety/equipment       General transfer comment: cues for LE management and use of UEs to self assist.  Pt very impulsive and unsafe  Ambulation/Gait Ambulation/Gait assistance: Min assist;Mod assist;+2 physical assistance;+2 safety/equipment Ambulation Distance (Feet): 22 Feet Assistive device: Rolling walker (2 wheeled) Gait Pattern/deviations: Step-to pattern;Decreased step length - right;Decreased step length - left;Shuffle;Trunk flexed     General Gait Details: cues for sequence, posture and position from RW.  Pt very impulsive and  unsafe.   Stairs            Wheelchair Mobility    Modified Rankin (Stroke Patients Only)       Balance Overall balance assessment: Needs assistance Sitting-balance support: Feet supported;No upper extremity supported Sitting balance-Leahy Scale: Fair     Standing balance support: Bilateral upper extremity supported Standing balance-Leahy Scale: Poor                              Cognition Arousal/Alertness: Awake/alert Behavior During Therapy: Impulsive Overall Cognitive Status: No family/caregiver present to determine baseline cognitive functioning                                 General Comments: Pt with difficulty following cues and focusing on task      Exercises      General Comments        Pertinent Vitals/Pain Pain Assessment: Faces Faces Pain Scale: Hurts little more Pain Location: R knee Pain Descriptors / Indicators: Sore;Grimacing;Guarding Pain Intervention(s): Limited activity within patient's tolerance;Monitored during session;Premedicated before session;Ice applied    Home Living                      Prior Function            PT Goals (current goals can now be found in the care plan section) Acute Rehab PT Goals Patient Stated Goal: Less pain PT Goal Formulation: With patient Time For Goal Achievement: 04/09/17 Potential to Achieve Goals: Fair Progress towards PT goals: Progressing toward goals  Frequency    7X/week      PT Plan Current plan remains appropriate    Co-evaluation              AM-PAC PT "6 Clicks" Daily Activity  Outcome Measure  Difficulty turning over in bed (including adjusting bedclothes, sheets and blankets)?: Unable Difficulty moving from lying on back to sitting on the side of the bed? : Unable Difficulty sitting down on and standing up from a chair with arms (e.g., wheelchair, bedside commode, etc,.)?: Unable Help needed moving to and from a bed to chair  (including a wheelchair)?: A Lot Help needed walking in hospital room?: A Lot Help needed climbing 3-5 steps with a railing? : Total 6 Click Score: 8    End of Session Equipment Utilized During Treatment: Gait belt;Right knee immobilizer Activity Tolerance: Patient limited by fatigue;Patient limited by pain;Other (comment) Patient left: in chair;with call bell/phone within reach Nurse Communication: Mobility status PT Visit Diagnosis: Pain;Difficulty in walking, not elsewhere classified (R26.2) Pain - Right/Left: Right Pain - part of body: Knee     Time: 8828-0034 PT Time Calculation (min) (ACUTE ONLY): 24 min  Charges:  $Gait Training: 8-22 mins $Therapeutic Activity: 8-22 mins                    G Codes:       Pg [phone]    Danile Trier 04/05/2017, 12:34 PM

## 2017-04-05 NOTE — Progress Notes (Signed)
OT Cancellation Note  Patient Details Name: Katrina Castro MRN: [id number] DOB: 06-07-1946   Cancelled Treatment:    Reason Eval/Treat Not Completed: Other (comment).  Noted, pt plans SNF.  Will defer OT evaluation to that venue.  Bernita Beckstrom 04/05/2017, 7:35 AM  Lesle Chris, OTR/L [phone] 04/05/2017

## 2017-04-05 NOTE — Discharge Summary (Signed)
Physician Discharge Summary   Patient ID: Katrina Castro MRN: [id number] DOB/AGE: 1945-11-22 71 y.o.  Admit date: 04/03/2017 Discharge date: 04/06/2017  Primary Diagnosis: Primary osteoarthritis right knee   Admission Diagnoses:  Past Medical History:  Diagnosis Date  . Arthritis   . Concussion    2015  . Depression   . Diabetes ([place])    type 2  . Dysphagia   . Dysrhythmia    afib  . GERD (gastroesophageal reflux disease)   . Headache   . History of bronchitis   . Seasonal allergies   . Toenail fungus    Discharge Diagnoses:   Active Problems:   Hx of total knee arthroplasty, right  Estimated body mass index is 24.98 kg/m as calculated from the following:   Height as of this encounter: 5' 3"  (1.6 m).   Weight as of this encounter: 64 kg (141 lb).  Procedure:  Procedure(s) (LRB): RIGHT TOTAL KNEE ARTHROPLASTY (Right)   Consults: None  HPI: Katrina Castro, 71 y.o. female, has a history of pain and functional disability in the right knee due to arthritis and has failed non-surgical conservative treatments for greater than 12 weeks to includeNSAID's and/or analgesics, corticosteriod injections, viscosupplementation injections, use of assistive devices and activity modification.  Onset of symptoms was gradual, starting 5 years ago with gradually worsening course since that time. The patient noted prior procedures on the knee to include  arthroscopy and menisectomy on the right knee(s).  Patient currently rates pain in the right knee(s) at 8 out of 10 with activity. Patient has night pain, worsening of pain with activity and weight bearing, pain that interferes with activities of daily living, pain with passive range of motion, crepitus and joint swelling.  Patient has evidence of periarticular osteophytes and joint space narrowing by imaging studies. There is no active infection.  Laboratory Data: Admission on 04/03/2017  Component Date Value Ref Range Status  .  Glucose-Capillary 04/03/2017 89  65 - 99 mg/dL Final  . Comment 1 04/03/2017 Notify RN   Final  . Comment 2 04/03/2017 Document in Chart   Final  . Glucose-Capillary 04/03/2017 70  65 - 99 mg/dL Final  . Glucose-Capillary 04/03/2017 98  65 - 99 mg/dL Final  . WBC 04/04/2017 15.2* 4.0 - 10.5 K/uL Final  . RBC 04/04/2017 4.36  3.87 - 5.11 MIL/uL Final  . Hemoglobin 04/04/2017 12.4  12.0 - 15.0 g/dL Final  . HCT 04/04/2017 37.7  36.0 - 46.0 % Final  . MCV 04/04/2017 86.5  78.0 - 100.0 fL Final  . MCH 04/04/2017 28.4  26.0 - 34.0 pg Final  . MCHC 04/04/2017 32.9  30.0 - 36.0 g/dL Final  . RDW 04/04/2017 13.3  11.5 - 15.5 % Final  . Platelets 04/04/2017 311  150 - 400 K/uL Final  . Sodium 04/04/2017 131* 135 - 145 mmol/L Final  . Potassium 04/04/2017 3.7  3.5 - 5.1 mmol/L Final  . Chloride 04/04/2017 98* 101 - 111 mmol/L Final  . CO2 04/04/2017 24  22 - 32 mmol/L Final  . Glucose, Bld 04/04/2017 216* 65 - 99 mg/dL Final  . BUN 04/04/2017 7  6 - 20 mg/dL Final  . Creatinine, Ser 04/04/2017 0.59  0.44 - 1.00 mg/dL Final  . Calcium 04/04/2017 8.9  8.9 - 10.3 mg/dL Final  . GFR calc non Af Amer 04/04/2017 >60  >60 mL/min Final  . GFR calc Af Amer 04/04/2017 >60  >60 mL/min Final   Comment: (NOTE) The  eGFR has been calculated using the CKD EPI equation. This calculation has not been validated in all clinical situations. eGFR's persistently <60 mL/min signify possible Chronic Kidney Disease.   . Anion gap 04/04/2017 9  5 - 15 Final  . Glucose-Capillary 04/03/2017 223* 65 - 99 mg/dL Final  . Glucose-Capillary 04/04/2017 195* 65 - 99 mg/dL Final  . Glucose-Capillary 04/04/2017 200* 65 - 99 mg/dL Final  . Glucose-Capillary 04/04/2017 172* 65 - 99 mg/dL Final  . Glucose-Capillary 04/04/2017 179* 65 - 99 mg/dL Final  . WBC 04/05/2017 21.1* 4.0 - 10.5 K/uL Final  . RBC 04/05/2017 4.45  3.87 - 5.11 MIL/uL Final  . Hemoglobin 04/05/2017 13.1  12.0 - 15.0 g/dL Final  . HCT 04/05/2017 37.9  36.0 -  46.0 % Final  . MCV 04/05/2017 85.2  78.0 - 100.0 fL Final  . MCH 04/05/2017 29.4  26.0 - 34.0 pg Final  . MCHC 04/05/2017 34.6  30.0 - 36.0 g/dL Final  . RDW 04/05/2017 13.3  11.5 - 15.5 % Final  . Platelets 04/05/2017 409* 150 - 400 K/uL Final  . Sodium 04/05/2017 125* 135 - 145 mmol/L Final  . Potassium 04/05/2017 3.6  3.5 - 5.1 mmol/L Final  . Chloride 04/05/2017 91* 101 - 111 mmol/L Final  . CO2 04/05/2017 24  22 - 32 mmol/L Final  . Glucose, Bld 04/05/2017 178* 65 - 99 mg/dL Final  . BUN 04/05/2017 8  6 - 20 mg/dL Final  . Creatinine, Ser 04/05/2017 0.63  0.44 - 1.00 mg/dL Final  . Calcium 04/05/2017 9.4  8.9 - 10.3 mg/dL Final  . GFR calc non Af Amer 04/05/2017 >60  >60 mL/min Final  . GFR calc Af Amer 04/05/2017 >60  >60 mL/min Final   Comment: (NOTE) The eGFR has been calculated using the CKD EPI equation. This calculation has not been validated in all clinical situations. eGFR's persistently <60 mL/min signify possible Chronic Kidney Disease.   . Anion gap 04/05/2017 10  5 - 15 Final  . Glucose-Capillary 04/04/2017 169* 65 - 99 mg/dL Final  . Glucose-Capillary 04/04/2017 162* 65 - 99 mg/dL Final  . Glucose-Capillary 04/05/2017 156* 65 - 99 mg/dL Final  Hospital Outpatient Visit on 03/25/2017  Component Date Value Ref Range Status  . aPTT 03/25/2017 33  24 - 36 seconds Final  . WBC 03/25/2017 17.1* 4.0 - 10.5 K/uL Final  . RBC 03/25/2017 4.63  3.87 - 5.11 MIL/uL Final  . Hemoglobin 03/25/2017 13.4  12.0 - 15.0 g/dL Final  . HCT 03/25/2017 40.2  36.0 - 46.0 % Final  . MCV 03/25/2017 86.8  78.0 - 100.0 fL Final  . MCH 03/25/2017 28.9  26.0 - 34.0 pg Final  . MCHC 03/25/2017 33.3  30.0 - 36.0 g/dL Final  . RDW 03/25/2017 13.6  11.5 - 15.5 % Final  . Platelets 03/25/2017 354  150 - 400 K/uL Final  . Neutrophils Relative % 03/25/2017 71  % Final  . Neutro Abs 03/25/2017 12.1* 1.7 - 7.7 K/uL Final  . Lymphocytes Relative 03/25/2017 23  % Final  . Lymphs Abs 03/25/2017 3.9   0.7 - 4.0 K/uL Final  . Monocytes Relative 03/25/2017 6  % Final  . Monocytes Absolute 03/25/2017 1.0  0.1 - 1.0 K/uL Final  . Eosinophils Relative 03/25/2017 0  % Final  . Eosinophils Absolute 03/25/2017 0.1  0.0 - 0.7 K/uL Final  . Basophils Relative 03/25/2017 0  % Final  . Basophils Absolute 03/25/2017 0.0  0.0 -  0.1 K/uL Final  . Sodium 03/25/2017 142  135 - 145 mmol/L Final  . Potassium 03/25/2017 4.2  3.5 - 5.1 mmol/L Final  . Chloride 03/25/2017 105  101 - 111 mmol/L Final  . CO2 03/25/2017 28  22 - 32 mmol/L Final  . Glucose, Bld 03/25/2017 86  65 - 99 mg/dL Final  . BUN 03/25/2017 15  6 - 20 mg/dL Final  . Creatinine, Ser 03/25/2017 0.72  0.44 - 1.00 mg/dL Final  . Calcium 03/25/2017 9.9  8.9 - 10.3 mg/dL Final  . Total Protein 03/25/2017 7.7  6.5 - 8.1 g/dL Final  . Albumin 03/25/2017 4.2  3.5 - 5.0 g/dL Final  . AST 03/25/2017 21  15 - 41 U/L Final  . ALT 03/25/2017 22  14 - 54 U/L Final  . Alkaline Phosphatase 03/25/2017 79  38 - 126 U/L Final  . Total Bilirubin 03/25/2017 0.4  0.3 - 1.2 mg/dL Final  . GFR calc non Af Amer 03/25/2017 >60  >60 mL/min Final  . GFR calc Af Amer 03/25/2017 >60  >60 mL/min Final   Comment: (NOTE) The eGFR has been calculated using the CKD EPI equation. This calculation has not been validated in all clinical situations. eGFR's persistently <60 mL/min signify possible Chronic Kidney Disease.   . Anion gap 03/25/2017 9  5 - 15 Final  . Prothrombin Time 03/25/2017 16.1* 11.4 - 15.2 seconds Final  . INR 03/25/2017 1.30   Final  . ABO/RH(D) 03/25/2017 A POS   Final  . Antibody Screen 03/25/2017 NEG   Final  . Sample Expiration 03/25/2017 04/06/2017   Final  . Extend sample reason 03/25/2017 NO TRANSFUSIONS OR PREGNANCY IN THE PAST 3 MONTHS   Final  . Color, Urine 03/25/2017 YELLOW  YELLOW Final  . APPearance 03/25/2017 CLEAR  CLEAR Final  . Specific Gravity, Urine 03/25/2017 1.017  1.005 - 1.030 Final  . pH 03/25/2017 6.0  5.0 - 8.0 Final  .  Glucose, UA 03/25/2017 NEGATIVE  NEGATIVE mg/dL Final  . Hgb urine dipstick 03/25/2017 NEGATIVE  NEGATIVE Final  . Bilirubin Urine 03/25/2017 NEGATIVE  NEGATIVE Final  . Ketones, ur 03/25/2017 NEGATIVE  NEGATIVE mg/dL Final  . Protein, ur 03/25/2017 NEGATIVE  NEGATIVE mg/dL Final  . Nitrite 03/25/2017 NEGATIVE  NEGATIVE Final  . Leukocytes, UA 03/25/2017 TRACE* NEGATIVE Final  . RBC / HPF 03/25/2017 NONE SEEN  0 - 5 RBC/hpf Final  . WBC, UA 03/25/2017 0-5  0 - 5 WBC/hpf Final  . Bacteria, UA 03/25/2017 NONE SEEN  NONE SEEN Final  . Squamous Epithelial / LPF 03/25/2017 0-5* NONE SEEN Final  . Mucus 03/25/2017 PRESENT   Final  . MRSA, PCR 03/25/2017 NEGATIVE  NEGATIVE Final  . Staphylococcus aureus 03/25/2017 NEGATIVE  NEGATIVE Final   Comment: (NOTE) The Xpert SA Assay (FDA approved for NASAL specimens in patients 20 years of age and older), is one component of a comprehensive surveillance program. It is not intended to diagnose infection nor to guide or monitor treatment.   . Glucose-Capillary 03/25/2017 90  65 - 99 mg/dL Final  . ABO/RH(D) 03/25/2017 A POS   Final     X-Rays:Dg Chest 2 View  Result Date: 03/25/2017 CLINICAL DATA:  Preop for right knee surgery.  Hypertension. EXAM: CHEST  2 VIEW COMPARISON:  Radiograph of May 18, 2016. FINDINGS: The heart size and mediastinal contours are within normal limits. Both lungs are clear. No pneumothorax or pleural effusion is noted. The visualized skeletal structures are unremarkable.  IMPRESSION: No active cardiopulmonary disease. Electronically Signed   By: Marijo Conception, M.D.   On: 03/25/2017 15:14    EKG: Orders placed or performed during the hospital encounter of 03/25/17  . EKG  . EKG     Hospital Course: Katrina Castro is a 71 y.o. who was admitted to [facility]. They were brought to the operating room on 04/03/2017 and underwent Procedure(s): RIGHT TOTAL KNEE ARTHROPLASTY.  Patient tolerated the procedure well  and was later transferred to the recovery room and then to the orthopaedic floor for postoperative care.  They were given PO and IV analgesics for pain control following their surgery.  They were given 24 hours of postoperative antibiotics of  Anti-infectives    Start     Dose/Rate Route Frequency Ordered Stop   04/03/17 1700  acyclovir (ZOVIRAX) tablet 400 mg     400 mg Oral Daily 04/03/17 1406     04/03/17 1700  ceFAZolin (ANCEF) IVPB 1 g/50 mL premix     1 g 100 mL/hr over 30 Minutes Intravenous Every 6 hours 04/03/17 1406 04/03/17 2341   04/03/17 1106  polymyxin B 500,000 Units, bacitracin 50,000 Units in sodium chloride 0.9 % 500 mL irrigation  Status:  Discontinued       As needed 04/03/17 1106 04/03/17 1215   04/03/17 0752  ceFAZolin (ANCEF) IVPB 2g/100 mL premix     2 g 200 mL/hr over 30 Minutes Intravenous On call to O.R. 04/03/17 2542 04/03/17 1040     and started on DVT prophylaxis in the form of Xarelto.   PT and OT were ordered for total joint protocol.  Discharge planning consulted to help with postop disposition and equipment needs.  Patient had a fair night on the evening of surgery.  They started to get up OOB with therapy on day one. Hemovac drain was pulled without difficulty.  She progressed slowly with therapy, requiring 2+ assist. Continued to work with therapy into day two.  Dressing was changed on day two and the incision was clean and dry. Patient treated for hyponatremia post op day 2. Plan for DC to SNF post op day three. [facility]   Diet: Cardiac diet and Diabetic diet Activity:WBAT Follow-up:in 2 weeks Disposition - Skilled nursing facility Discharged Condition: stable   Discharge Instructions    Call MD / Call 911    Complete by:  As directed    If you experience chest pain or shortness of breath, CALL 911 and be transported to the hospital emergency room.  If you develope a fever above 101 F, pus (white drainage)  or increased drainage or redness at the wound, or calf pain, call your surgeon's office.   Constipation Prevention    Complete by:  As directed    Drink plenty of fluids.  Prune juice may be helpful.  You may use a stool softener, such as Colace (over the counter) 100 mg twice a day.  Use MiraLax (over the counter) for constipation as needed.   Diet - low sodium heart healthy    Complete by:  As directed    Diet Carb Modified    Complete by:  As directed    Discharge instructions    Complete by:  As directed    INSTRUCTIONS AFTER JOINT REPLACEMENT   Remove items at home which could result in a fall. This includes throw rugs or furniture in walking pathways ICE to  the affected joint every three hours while awake for 30 minutes at a time, for at least the first 3-5 days, and then as needed for pain and swelling.  Continue to use ice for pain and swelling. You may notice swelling that will progress down to the foot and ankle.  This is normal after surgery.  Elevate your leg when you are not up walking on it.   Continue to use the breathing machine you got in the hospital (incentive spirometer) which will help keep your temperature down.  It is common for your temperature to cycle up and down following surgery, especially at night when you are not up moving around and exerting yourself.  The breathing machine keeps your lungs expanded and your temperature down.   DIET:  As you were doing prior to hospitalization, we recommend a well-balanced diet.  DRESSING / WOUND CARE / SHOWERING  You may change your dressing every day with sterile gauze.  Please use good hand washing techniques before changing the dressing.  Do not use any lotions or creams on the incision until instructed by your surgeon.  ACTIVITY  Increase activity slowly as tolerated, but follow the weight bearing instructions below.   No driving for 6 weeks or until further direction given by your physician.  You cannot drive while  taking narcotics.  No lifting or carrying greater than 10 lbs. until further directed by your surgeon. Avoid periods of inactivity such as sitting longer than an hour when not asleep. This helps prevent blood clots.  You may return to work once you are authorized by your doctor.     WEIGHT BEARING   Weight bearing as tolerated with assist device (walker, cane, etc) as directed, use it as long as suggested by your surgeon or therapist, typically at least 4-6 weeks.   EXERCISES  Results after joint replacement surgery are often greatly improved when you follow the exercise, range of motion and muscle strengthening exercises prescribed by your doctor. Safety measures are also important to protect the joint from further injury. Any time any of these exercises cause you to have increased pain or swelling, decrease what you are doing until you are comfortable again and then slowly increase them. If you have problems or questions, call your caregiver or physical therapist for advice.   Rehabilitation is important following a joint replacement. After just a few days of immobilization, the muscles of the leg can become weakened and shrink (atrophy).  These exercises are designed to build up the tone and strength of the thigh and leg muscles and to improve motion. Often times heat used for twenty to thirty minutes before working out will loosen up your tissues and help with improving the range of motion but do not use heat for the first two weeks following surgery (sometimes heat can increase post-operative swelling).   These exercises can be done on a training (exercise) mat, on the floor, on a table or on a bed. Use whatever works the best and is most comfortable for you.    Use music or television while you are exercising so that the exercises are a pleasant break in your day. This will make your life better with the exercises acting as a break in your routine that you can look forward to.   Perform all  exercises about fifteen times, three times per day or as directed.  You should exercise both the operative leg and the other leg as well.  Exercises include:  Quad Sets - Tighten up the muscle on the front of the thigh (Quad) and hold for 5-10 seconds.   Straight Leg Raises - With your knee straight (if you were given a brace, keep it on), lift the leg to 60 degrees, hold for 3 seconds, and slowly lower the leg.  Perform this exercise against resistance later as your leg gets stronger.  Leg Slides: Lying on your back, slowly slide your foot toward your buttocks, bending your knee up off the floor (only go as far as is comfortable). Then slowly slide your foot back down until your leg is flat on the floor again.  Angel Wings: Lying on your back spread your legs to the side as far apart as you can without causing discomfort.  Hamstring Strength:  Lying on your back, push your heel against the floor with your leg straight by tightening up the muscles of your buttocks.  Repeat, but this time bend your knee to a comfortable angle, and push your heel against the floor.  You may put a pillow under the heel to make it more comfortable if necessary.   A rehabilitation program following joint replacement surgery can speed recovery and prevent re-injury in the future due to weakened muscles. Contact your doctor or a physical therapist for more information on knee rehabilitation.    CONSTIPATION  Constipation is defined medically as fewer than three stools per week and severe constipation as less than one stool per week.  Even if you have a regular bowel pattern at home, your normal regimen is likely to be disrupted due to multiple reasons following surgery.  Combination of anesthesia, postoperative narcotics, change in appetite and fluid intake all can affect your bowels.   YOU MUST use at least one of the following options; they are listed in order of increasing strength to get the job done.  They are all  available over the counter, and you may need to use some, POSSIBLY even all of these options:    Drink plenty of fluids (prune juice may be helpful) and high fiber foods Colace 100 mg by mouth twice a day  Senokot for constipation as directed and as needed Dulcolax (bisacodyl), take with full glass of water  Miralax (polyethylene glycol) once or twice a day as needed.  If you have tried all these things and are unable to have a bowel movement in the first 3-4 days after surgery call either your surgeon or your primary doctor.    If you experience loose stools or diarrhea, hold the medications until you stool forms back up.  If your symptoms do not get better within 1 week or if they get worse, check with your doctor.  If you experience "the worst abdominal pain ever" or develop nausea or vomiting, please contact the office immediately for further recommendations for treatment.   ITCHING:  If you experience itching with your medications, try taking only a single pain pill, or even half a pain pill at a time.  You can also use Benadryl over the counter for itching or also to help with sleep.   TED HOSE STOCKINGS:  Use stockings on both legs until for at least 2 weeks or as directed by physician office. They may be removed at night for sleeping.  MEDICATIONS:  See your medication summary on the "After Visit Summary" that nursing will review with you.  You may have some home medications which will be placed on hold until you complete the course of  blood thinner medication.  It is important for you to complete the blood thinner medication as prescribed.  PRECAUTIONS:  If you experience chest pain or shortness of breath - call 911 immediately for transfer to the hospital emergency department.   If you develop a fever greater that 101 F, purulent drainage from wound, increased redness or drainage from wound, foul odor from the wound/dressing, or calf pain - CONTACT YOUR SURGEON.                                                    FOLLOW-UP APPOINTMENTS:  If you do not already have a post-op appointment, please call the office for an appointment to be seen by your surgeon.  Guidelines for how soon to be seen are listed in your "After Visit Summary", but are typically between 1-4 weeks after surgery.    MAKE SURE YOU:  Understand these instructions.  Get help right away if you are not doing well or get worse.    Thank you for letting us be a part of your medical care team.  It is a privilege we respect greatly.  We hope these instructions will help you stay on track for a fast and full recovery!   Increase activity slowly as tolerated    Complete by:  As directed      Allergies as of 04/05/2017      Reactions   Levofloxacin Anaphylaxis, Rash   Cardizem [diltiazem] Other (See Comments)   Patient states that she could not think, felt that she was in the twilight zone. Arm discomfort.   Beef-derived Products    Sulfa Antibiotics Itching   Codeine Sulfate Nausea Only   Diazepam Nausea Only      Medication List    STOP taking these medications   predniSONE 10 MG tablet Commonly known as:  DELTASONE   dabigatran 150 MG Caps capsule Commonly known as:  PRADAXA Take 1 capsule (150 mg total) by mouth 2 (two) times daily.     TAKE these medications   acyclovir 400 MG tablet Commonly known as:  ZOVIRAX Take 400 mg by mouth daily.   ALPRAZolam 0.5 MG tablet Commonly known as:  XANAX Take 0.25-0.5 mg by mouth at bedtime as needed (for sleep.).   bisacodyl 5 MG EC tablet Commonly known as:  DULCOLAX Take 1 tablet (5 mg total) by mouth daily as needed for moderate constipation.   cetirizine 10 MG tablet Commonly known as:  ZYRTEC Take 10 mg by mouth daily.      glimepiride 2 MG tablet Commonly known as:  AMARYL Take 2 mg by mouth 2 (two) times daily.   HYDROmorphone 2 MG tablet Commonly known as:  DILAUDID Take 1 tablet (2 mg total) by mouth every 4 (four) hours as needed  for moderate pain.   metFORMIN 500 MG 24 hr tablet Commonly known as:  GLUCOPHAGE-XR Take 500 mg by mouth 2 (two) times daily.   methocarbamol 500 MG tablet Commonly known as:  ROBAXIN Take 1 tablet (500 mg total) by mouth every 6 (six) hours as needed for muscle spasms. What changed:  when to take this  reasons to take this   metoprolol tartrate 50 MG tablet Commonly known as:  LOPRESSOR Take 50 mg by mouth daily after breakfast.   oxybutynin 10 MG 24 hr tablet Commonly  known as:  DITROPAN-XL Take 10 mg by mouth daily.   pantoprazole 40 MG tablet Commonly known as:  PROTONIX Take 1 tablet (40 mg total) by mouth at bedtime.   polyethylene glycol packet Commonly known as:  MIRALAX / GLYCOLAX Take 17 g by mouth daily as needed for mild constipation.   rivaroxaban 20 MG Tabs tablet Commonly known as:  [facility] Equipment        Start     Ordered   04/03/17 0753  For home use only DME Walker rolling  Once    Question:  Patient needs a walker to treat with the following condition  Answer:  S/P total knee arthroplasty, right   04/03/17 0752   04/03/17 0753  For home use only DME 3 n 1  Once     04/03/17 0122      Contact information for follow-up providers    Latanya Maudlin, MD. Schedule an appointment as soon as possible for a visit in 2 week(s).   Specialty:  Orthopedic Surgery Contact information: [address] [place] [address] [phone]            Contact information for after-discharge care    [facility] Follow up.   Specialty:  [place] information: [address] [place] [phone]                  Signed: Ardeen Jourdain, PA-C Orthopaedic Surgery 04/05/2017, 7:32 AM

## 2017-04-05 NOTE — Clinical Social Work Placement (Signed)
   CLINICAL SOCIAL WORK PLACEMENT  NOTE  Date:  04/05/2017  Patient Details  Name: Katrina Castro MRN: [id number] Date of Birth: March 26, 1946  Clinical Social Work is seeking post-discharge placement for this patient at the [place] level of care (*CSW will initial, date and re-position this form in  chart as items are completed):  Yes   Patient/family provided with [place] Work Department's list of facilities offering this level of care within the geographic area requested by the patient (or if unable, by the patient's family).  Yes   Patient/family informed of their freedom to choose among providers that offer the needed level of care, that participate in Medicare, Medicaid or managed care program needed by the patient, have an available bed and are willing to accept the patient.  Yes   Patient/family informed of 's ownership interest in [facility] and [facility], as well as of the fact that they are under no obligation to receive care at these facilities.  PASRR submitted to EDS on 04/04/17     PASRR number received on 04/04/17     Existing PASRR number confirmed on       FL2 transmitted to all facilities in geographic area requested by pt/family on       FL2 transmitted to all facilities within larger geographic area on       Patient informed that his/her managed care company has contracts with or will negotiate with certain facilities, including the following:  [facility]         Patient/family informed of bed offers received.  Patient chooses bed at [facility]     Physician recommends and patient chooses bed at      Patient to be transferred to [facility] on 04/06/17.  Patient to be transferred to facility by [facility]     Patient family notified on   of transfer.  Name of family member notified:  Patient will notify.      PHYSICIAN       Additional Comment:     _______________________________________________ Lia Hopping, LCSW 04/05/2017, 10:08 AM

## 2017-04-05 NOTE — Progress Notes (Signed)
   Subjective: 2 Days Post-Op Procedure(s) (LRB): RIGHT TOTAL KNEE ARTHROPLASTY (Right) Patient reports pain as moderate.   Patient seen in rounds for Dr. Gladstone Lighter. Patient is well, and has had no acute complaints or problems other than pain in the right knee. She reports that she is voiding well. Positive flatus. No BM. Reports that she has not eaten much since surgery.   Objective: Vital signs in last 24 hours: Temp:  [97.8 F (36.6 C)-98.3 F (36.8 C)] 98.1 F (36.7 C) (10/19 0508) Pulse Rate:  [81-101] 85 (10/19 0508) Resp:  [15-18] 15 (10/19 0508) BP: (150-168)/(62-67) 151/65 (10/19 0508) SpO2:  [95 %-99 %] 99 % (10/19 0508)  Intake/Output from previous day:  Intake/Output Summary (Last 24 hours) at 04/05/17 0726 Last data filed at 04/05/17 0600  Gross per 24 hour  Intake          2908.33 ml  Output             1550 ml  Net          1358.33 ml     Labs:  Recent Labs  04/04/17 0615 04/05/17 0604  HGB 12.4 13.1    Recent Labs  04/04/17 0615 04/05/17 0604  WBC 15.2* 21.1*  RBC 4.36 4.45  HCT 37.7 37.9  PLT 311 409*    Recent Labs  04/04/17 0615 04/05/17 0604  NA 131* 125*  K 3.7 3.6  CL 98* 91*  CO2 24 24  BUN 7 8  CREATININE 0.59 0.63  GLUCOSE 216* 178*  CALCIUM 8.9 9.4    EXAM General - Patient is Alert and Oriented Extremity - Neurologically intact Intact pulses distally Dorsiflexion/Plantar flexion intact No cellulitis present Compartment soft Dressing/Incision - clean, dry, no drainage Motor Function - intact, moving foot and toes well on exam.   Past Medical History:  Diagnosis Date  . Arthritis   . Concussion    2015  . Depression   . Diabetes ([place])    type 2  . Dysphagia   . Dysrhythmia    afib  . GERD (gastroesophageal reflux disease)   . Headache   . History of bronchitis   . Seasonal allergies   . Toenail fungus     Assessment/Plan: 2 Days Post-Op Procedure(s) (LRB): RIGHT TOTAL KNEE ARTHROPLASTY (Right) Active  Problems:   Hx of total knee arthroplasty, right  Estimated body mass index is 24.98 kg/m as calculated from the following:   Height as of this encounter: 5\' 3"  (1.6 m).   Weight as of this encounter: 64 kg (141 lb). Advance diet Up with therapy D/C IV fluids  DVT Prophylaxis - Xarelto Weight-Bearing as tolerated    Encouraged her to increase diet. Discussed importance of working hard in therapy. Dealing with some hyponatremia. Will stop IV fluids and give dose of furosemide. Will give potassium supplementation as precaution. Will continue therapy today. Plan for DC to SNF tomorrow after required 3 night inpatient stay.   Ardeen Jourdain, PA-C Orthopaedic Surgery 04/05/2017, 7:26 AM

## 2017-04-05 NOTE — Progress Notes (Signed)
Physical Therapy Treatment Patient Details Name: Katrina Castro MRN: [id number] DOB: Feb 17, 1946 Today's Date: 04/05/2017    History of Present Illness Pt s/p R TKR and with hx of DM, a-fib and back surgery x2    PT Comments    Pt cooperative and continues impulsive and distractible but easier to refocus on task this pm.     Follow Up Recommendations  SNF     Equipment Recommendations  None recommended by PT    Recommendations for Other Services OT consult     Precautions / Restrictions Precautions Precautions: Knee;Fall Required Braces or Orthoses: Knee Immobilizer - Right Knee Immobilizer - Right: Discontinue once straight leg raise with < 10 degree lag Restrictions Weight Bearing Restrictions: No Other Position/Activity Restrictions: WBAT    Mobility  Bed Mobility Overal bed mobility: Needs Assistance Bed Mobility: Supine to Sit     Supine to sit: Min assist;Mod assist     General bed mobility comments: cues for sequence and use of L LE to self assist  Transfers Overall transfer level: Needs assistance Equipment used: Rolling walker (2 wheeled) Transfers: Sit to/from Stand Sit to Stand: Min assist;Mod assist;+2 physical assistance;+2 safety/equipment Stand pivot transfers: Min assist;Mod assist;+2 safety/equipment       General transfer comment: cues for LE management and use of UEs to self assist.  Pt very impulsive and unsafe  Ambulation/Gait Ambulation/Gait assistance: Min assist;Mod assist;+2 physical assistance;+2 safety/equipment Ambulation Distance (Feet): 22 Feet Assistive device: Rolling walker (2 wheeled) Gait Pattern/deviations: Step-to pattern;Decreased step length - right;Decreased step length - left;Shuffle;Trunk flexed     General Gait Details: cues for sequence, posture and position from RW.  Pt very impulsive and unsafe.   Stairs            Wheelchair Mobility    Modified Rankin (Stroke Patients Only)       Balance  Overall balance assessment: Needs assistance Sitting-balance support: Feet supported;No upper extremity supported Sitting balance-Leahy Scale: Fair     Standing balance support: Bilateral upper extremity supported Standing balance-Leahy Scale: Poor                              Cognition Arousal/Alertness: Awake/alert Behavior During Therapy: Impulsive Overall Cognitive Status: No family/caregiver present to determine baseline cognitive functioning                                 General Comments: Pt with difficulty following cues and focusing on task      Exercises Total Joint Exercises Ankle Circles/Pumps: AROM;Both;20 reps;Supine Quad Sets: AROM;Both;Supine;10 reps Heel Slides: AAROM;Right;15 reps;Supine Straight Leg Raises: AAROM;Supine;Right;15 reps Goniometric ROM: AAROM R knee -8 - 45    General Comments        Pertinent Vitals/Pain Pain Assessment: Faces Faces Pain Scale: Hurts even more Pain Location: R knee Pain Descriptors / Indicators: Sore;Grimacing;Guarding Pain Intervention(s): Limited activity within patient's tolerance;Monitored during session;Premedicated before session;Ice applied    Home Living                      Prior Function            PT Goals (current goals can now be found in the care plan section) Acute Rehab PT Goals Patient Stated Goal: Less pain PT Goal Formulation: With patient Time For Goal Achievement: 04/09/17 Potential to Achieve Goals: Fair Progress towards PT  goals: Progressing toward goals    Frequency    7X/week      PT Plan Current plan remains appropriate    Co-evaluation              AM-PAC PT "6 Clicks" Daily Activity  Outcome Measure  Difficulty turning over in bed (including adjusting bedclothes, sheets and blankets)?: Unable Difficulty moving from lying on back to sitting on the side of the bed? : Unable Difficulty sitting down on and standing up from a chair  with arms (e.g., wheelchair, bedside commode, etc,.)?: Unable Help needed moving to and from a bed to chair (including a wheelchair)?: A Lot Help needed walking in hospital room?: A Lot Help needed climbing 3-5 steps with a railing? : Total 6 Click Score: 8    End of Session Equipment Utilized During Treatment: Gait belt;Right knee immobilizer Activity Tolerance: Patient limited by fatigue;Patient limited by pain;Other (comment) Patient left: in chair;with call bell/phone within reach Nurse Communication: Mobility status PT Visit Diagnosis: Pain;Difficulty in walking, not elsewhere classified (R26.2) Pain - Right/Left: Right Pain - part of body: Knee     Time: 1400-1440 PT Time Calculation (min) (ACUTE ONLY): 40 min  Charges:  $Gait Training: 8-22 mins $Therapeutic Exercise: 23-37 mins $Therapeutic Activity: 8-22 mins                    G Codes:       Pg [phone]    Unity Luepke 04/05/2017, 3:24 PM

## 2017-04-06 DIAGNOSIS — Z886 Allergy status to analgesic agent status: Secondary | ICD-10-CM | POA: Diagnosis not present

## 2017-04-06 DIAGNOSIS — Z888 Allergy status to other drugs, medicaments and biological substances status: Secondary | ICD-10-CM | POA: Diagnosis not present

## 2017-04-06 DIAGNOSIS — R000 Tachycardia, unspecified: Secondary | ICD-10-CM | POA: Diagnosis not present

## 2017-04-06 DIAGNOSIS — R197 Diarrhea, unspecified: Secondary | ICD-10-CM | POA: Diagnosis not present

## 2017-04-06 DIAGNOSIS — E860 Dehydration: Secondary | ICD-10-CM | POA: Diagnosis not present

## 2017-04-06 DIAGNOSIS — K449 Diaphragmatic hernia without obstruction or gangrene: Secondary | ICD-10-CM | POA: Diagnosis not present

## 2017-04-06 DIAGNOSIS — M810 Age-related osteoporosis without current pathological fracture: Secondary | ICD-10-CM | POA: Diagnosis not present

## 2017-04-06 DIAGNOSIS — I10 Essential (primary) hypertension: Secondary | ICD-10-CM | POA: Diagnosis not present

## 2017-04-06 DIAGNOSIS — Z7901 Long term (current) use of anticoagulants: Secondary | ICD-10-CM | POA: Diagnosis not present

## 2017-04-06 DIAGNOSIS — G8911 Acute pain due to trauma: Secondary | ICD-10-CM | POA: Diagnosis not present

## 2017-04-06 DIAGNOSIS — J40 Bronchitis, not specified as acute or chronic: Secondary | ICD-10-CM | POA: Diagnosis not present

## 2017-04-06 DIAGNOSIS — I4891 Unspecified atrial fibrillation: Secondary | ICD-10-CM | POA: Diagnosis present

## 2017-04-06 DIAGNOSIS — R51 Headache: Secondary | ICD-10-CM | POA: Diagnosis not present

## 2017-04-06 DIAGNOSIS — E876 Hypokalemia: Secondary | ICD-10-CM | POA: Diagnosis not present

## 2017-04-06 DIAGNOSIS — Z79891 Long term (current) use of opiate analgesic: Secondary | ICD-10-CM | POA: Diagnosis not present

## 2017-04-06 DIAGNOSIS — Z79899 Other long term (current) drug therapy: Secondary | ICD-10-CM | POA: Diagnosis not present

## 2017-04-06 DIAGNOSIS — Z833 Family history of diabetes mellitus: Secondary | ICD-10-CM | POA: Diagnosis not present

## 2017-04-06 DIAGNOSIS — K219 Gastro-esophageal reflux disease without esophagitis: Secondary | ICD-10-CM | POA: Diagnosis present

## 2017-04-06 DIAGNOSIS — Z7984 Long term (current) use of oral hypoglycemic drugs: Secondary | ICD-10-CM | POA: Diagnosis not present

## 2017-04-06 DIAGNOSIS — E7800 Pure hypercholesterolemia, unspecified: Secondary | ICD-10-CM | POA: Diagnosis not present

## 2017-04-06 DIAGNOSIS — R42 Dizziness and giddiness: Secondary | ICD-10-CM | POA: Diagnosis not present

## 2017-04-06 DIAGNOSIS — Z96651 Presence of right artificial knee joint: Secondary | ICD-10-CM | POA: Diagnosis not present

## 2017-04-06 DIAGNOSIS — D72829 Elevated white blood cell count, unspecified: Secondary | ICD-10-CM | POA: Diagnosis not present

## 2017-04-06 DIAGNOSIS — R112 Nausea with vomiting, unspecified: Secondary | ICD-10-CM | POA: Diagnosis not present

## 2017-04-06 DIAGNOSIS — Z471 Aftercare following joint replacement surgery: Secondary | ICD-10-CM | POA: Diagnosis not present

## 2017-04-06 DIAGNOSIS — Z881 Allergy status to other antibiotic agents status: Secondary | ICD-10-CM | POA: Diagnosis not present

## 2017-04-06 DIAGNOSIS — F329 Major depressive disorder, single episode, unspecified: Secondary | ICD-10-CM | POA: Diagnosis not present

## 2017-04-06 DIAGNOSIS — T8140XA Infection following a procedure, unspecified, initial encounter: Secondary | ICD-10-CM | POA: Diagnosis not present

## 2017-04-06 DIAGNOSIS — Z823 Family history of stroke: Secondary | ICD-10-CM | POA: Diagnosis not present

## 2017-04-06 DIAGNOSIS — E119 Type 2 diabetes mellitus without complications: Secondary | ICD-10-CM | POA: Diagnosis present

## 2017-04-06 DIAGNOSIS — A0472 Enterocolitis due to Clostridium difficile, not specified as recurrent: Secondary | ICD-10-CM | POA: Diagnosis not present

## 2017-04-06 DIAGNOSIS — S8002XA Contusion of left knee, initial encounter: Secondary | ICD-10-CM | POA: Diagnosis not present

## 2017-04-06 LAB — GLUCOSE, CAPILLARY
Glucose-Capillary: 117 mg/dL — ABNORMAL HIGH (ref 65–99)
Glucose-Capillary: 174 mg/dL — ABNORMAL HIGH (ref 65–99)

## 2017-04-06 LAB — CBC
HCT: 37 % (ref 36.0–46.0)
Hemoglobin: 12.3 g/dL (ref 12.0–15.0)
MCH: 28.5 pg (ref 26.0–34.0)
MCHC: 33.2 g/dL (ref 30.0–36.0)
MCV: 85.8 fL (ref 78.0–100.0)
Platelets: 408 10*3/uL — ABNORMAL HIGH (ref 150–400)
RBC: 4.31 MIL/uL (ref 3.87–5.11)
RDW: 13.7 % (ref 11.5–15.5)
WBC: 20 10*3/uL — ABNORMAL HIGH (ref 4.0–10.5)

## 2017-04-06 MED ORDER — ALPRAZOLAM 0.25 MG PO TABS: 0.2500 mg | ORAL_TABLET | Freq: Every evening | ORAL | 0 refills | Status: DC | PRN

## 2017-04-06 MED ORDER — METOCLOPRAMIDE HCL 5 MG/ML IJ SOLN: 10.0000 mg | Freq: Four times a day (QID) | INTRAMUSCULAR | Status: DC

## 2017-04-06 MED ORDER — METOCLOPRAMIDE HCL 5 MG/ML IJ SOLN: 10.0000 mg | Freq: Four times a day (QID) | INTRAMUSCULAR | Status: DC | PRN

## 2017-04-06 NOTE — Progress Notes (Signed)
Subjective: 3 Days Post-Op Procedure(s) (LRB): RIGHT TOTAL KNEE ARTHROPLASTY (Right) Patient reports pain as 2 on 0-10 scale. Doing very well. She has had and still does have an unexplained increase in her WBC. Will have her evaluated by hematology as an outpatient. Her woun is fineand she has been afebrile.   Objective: Vital signs in last 24 hours: Temp:  [97.6 F (36.4 C)-98.5 F (36.9 C)] 98.1 F (36.7 C) (10/20 0452) Pulse Rate:  [94-118] 118 (10/20 0452) Resp:  [16] 16 (10/20 0452) BP: (117-123)/(61-70) 123/62 (10/20 0452) SpO2:  [95 %-97 %] 97 % (10/20 0452)  Intake/Output from previous day: 10/19 0701 - 10/20 0700 In: 460 [P.O.:460] Out: 1600 [Urine:1600] Intake/Output this shift: No intake/output data recorded.   Recent Labs  04/04/17 0615 04/05/17 0604 04/06/17 0617  HGB 12.4 13.1 12.3    Recent Labs  04/05/17 0604 04/06/17 0617  WBC 21.1* 20.0*  RBC 4.45 4.31  HCT 37.9 37.0  PLT 409* 408*    Recent Labs  04/04/17 0615 04/05/17 0604  NA 131* 125*  K 3.7 3.6  CL 98* 91*  CO2 24 24  BUN 7 8  CREATININE 0.59 0.63  GLUCOSE 216* 178*  CALCIUM 8.9 9.4   No results for input(s): LABPT, INR in the last 72 hours.  Dorsiflexion/Plantar flexion intact No cellulitis present Compartment soft  Assessment/Plan: 3 Days Post-Op Procedure(s) (LRB): RIGHT TOTAL KNEE ARTHROPLASTY (Right) Up with therapy Discharge to SNF  Alaiah Lundy A 04/06/2017, 7:08 AM

## 2017-04-06 NOTE — Progress Notes (Signed)
Pt discharging today- will admit to [facility]) for SNF rehab.  Report # [id number]  Provided all information to facility via the [place]. Arranged non-emergency ambulance transportation. Pt informed family of plan- agreeable.  Sharren Bridge, MSW, LCSW Clinical Social Work 04/06/2017 [phone] weekend coverage

## 2017-04-06 NOTE — Progress Notes (Signed)
Pt c/o pain to rt heel. Found to have soft, sore place on lateral posterior heel, skin without visible changes. Pressure removed with rolled towel. Shay Jhaveri, [employer]

## 2017-04-06 NOTE — Progress Notes (Signed)
Called to give report to [facility], no answer. Nastassia Bazaldua, [employer]

## 2017-04-06 NOTE — Progress Notes (Signed)
Physical Therapy Treatment Patient Details Name: Katrina Castro MRN: [id number] DOB: 1946-01-02 Today's Date: 04/06/2017    History of Present Illness Pt s/p R TKR and with hx of DM, a-fib and back surgery x2    PT Comments    Pt cooperative but ltd this am by c/o nausea and dizziness with ambulation - BP 96/69 - RN aware.   Follow Up Recommendations  SNF     Equipment Recommendations  None recommended by PT    Recommendations for Other Services OT consult     Precautions / Restrictions Precautions Precautions: Knee;Fall Required Braces or Orthoses: Knee Immobilizer - Right Knee Immobilizer - Right: Discontinue once straight leg raise with < 10 degree lag Restrictions Weight Bearing Restrictions: No Other Position/Activity Restrictions: WBAT    Mobility  Bed Mobility Overal bed mobility: Needs Assistance Bed Mobility: Supine to Sit     Supine to sit: Min assist     General bed mobility comments: cues for sequence and use of L LE to self assist  Transfers Overall transfer level: Needs assistance Equipment used: Rolling walker (2 wheeled) Transfers: Sit to/from Stand Sit to Stand: Min assist;Mod assist         General transfer comment: cues for LE management and use of UEs to self assist.  Pt very impulsive and unsafe  Ambulation/Gait Ambulation/Gait assistance: Min assist;+2 physical assistance;+2 safety/equipment Ambulation Distance (Feet): 18 Feet Assistive device: Rolling walker (2 wheeled) Gait Pattern/deviations: Step-to pattern;Decreased step length - right;Decreased step length - left;Shuffle;Trunk flexed     General Gait Details: cues for sequence, posture and position from RW.  Pt impulsive and unsteady with c/o nausea and dizziness - BP 96/69 - RN aware   Stairs            Wheelchair Mobility    Modified Rankin (Stroke Patients Only)       Balance                                            Cognition  Arousal/Alertness: Awake/alert Behavior During Therapy: Impulsive Overall Cognitive Status: No family/caregiver present to determine baseline cognitive functioning                                 General Comments: Improvement in pt focus on task with decreased redirection required      Exercises      General Comments        Pertinent Vitals/Pain Pain Assessment: Faces Faces Pain Scale: Hurts little more Pain Location: R knee Pain Descriptors / Indicators: Sore;Grimacing;Guarding Pain Intervention(s): Limited activity within patient's tolerance;Monitored during session;Premedicated before session;Ice applied    Home Living                      Prior Function            PT Goals (current goals can now be found in the care plan section) Acute Rehab PT Goals Patient Stated Goal: Less pain PT Goal Formulation: With patient Time For Goal Achievement: 04/09/17 Potential to Achieve Goals: Fair Progress towards PT goals: Progressing toward goals    Frequency    7X/week      PT Plan Current plan remains appropriate    Co-evaluation  AM-PAC PT "6 Clicks" Daily Activity  Outcome Measure  Difficulty turning over in bed (including adjusting bedclothes, sheets and blankets)?: Unable Difficulty moving from lying on back to sitting on the side of the bed? : Unable Difficulty sitting down on and standing up from a chair with arms (e.g., wheelchair, bedside commode, etc,.)?: Unable Help needed moving to and from a bed to chair (including a wheelchair)?: A Lot Help needed walking in hospital room?: A Lot Help needed climbing 3-5 steps with a railing? : Total 6 Click Score: 8    End of Session Equipment Utilized During Treatment: Gait belt;Right knee immobilizer Activity Tolerance: Patient limited by fatigue;Patient limited by pain;Other (comment) Patient left: in chair;with call bell/phone within reach Nurse Communication: Mobility  status PT Visit Diagnosis: Pain;Difficulty in walking, not elsewhere classified (R26.2) Pain - Right/Left: Right Pain - part of body: Knee     Time: 9629-5284 PT Time Calculation (min) (ACUTE ONLY): 21 min  Charges:  $Gait Training: 8-22 mins                    G Codes:       Pg [phone]    Sireen Halk 04/06/2017, 1:46 PM

## 2017-04-06 NOTE — Care Management Note (Signed)
Case Management Note  Patient Details  Name: Katrina Castro MRN: [id number] Date of Birth: 1945/12/27  Subjective/Objective:    Right TKA                Action/Plan: Discharge Planning: Chart reviewed. Pt scheduled to dc to SNF. CSW following for SNF placement.   PCP Jerene Bears B MD  Expected Discharge Date:  04/06/17               Expected Discharge Plan:  [place]  In-House Referral:  Clinical Social Work  Discharge planning Services  CM Consult  Post Acute Care Choice:  NA Choice offered to:  NA  DME Arranged:  N/A DME Agency:  NA  HH Arranged:  NA HH Agency:  NA  Status of Service:  Completed, signed off  If discussed at [place] of Stay Meetings, dates discussed:    Additional Comments:  Erenest Rasher, RN 04/06/2017, 10:01 AM

## 2017-04-07 DIAGNOSIS — I10 Essential (primary) hypertension: Secondary | ICD-10-CM | POA: Diagnosis not present

## 2017-04-07 DIAGNOSIS — M810 Age-related osteoporosis without current pathological fracture: Secondary | ICD-10-CM | POA: Diagnosis not present

## 2017-04-07 DIAGNOSIS — Z96651 Presence of right artificial knee joint: Secondary | ICD-10-CM | POA: Diagnosis not present

## 2017-04-07 DIAGNOSIS — E7800 Pure hypercholesterolemia, unspecified: Secondary | ICD-10-CM | POA: Diagnosis not present

## 2017-04-08 DIAGNOSIS — D72829 Elevated white blood cell count, unspecified: Secondary | ICD-10-CM | POA: Diagnosis not present

## 2017-04-08 DIAGNOSIS — E119 Type 2 diabetes mellitus without complications: Secondary | ICD-10-CM | POA: Diagnosis present

## 2017-04-08 DIAGNOSIS — K219 Gastro-esophageal reflux disease without esophagitis: Secondary | ICD-10-CM | POA: Diagnosis present

## 2017-04-08 DIAGNOSIS — R112 Nausea with vomiting, unspecified: Secondary | ICD-10-CM | POA: Diagnosis not present

## 2017-04-08 DIAGNOSIS — Z7984 Long term (current) use of oral hypoglycemic drugs: Secondary | ICD-10-CM | POA: Diagnosis not present

## 2017-04-08 DIAGNOSIS — R278 Other lack of coordination: Secondary | ICD-10-CM | POA: Diagnosis not present

## 2017-04-08 DIAGNOSIS — Z888 Allergy status to other drugs, medicaments and biological substances status: Secondary | ICD-10-CM | POA: Diagnosis not present

## 2017-04-08 DIAGNOSIS — Z886 Allergy status to analgesic agent status: Secondary | ICD-10-CM | POA: Diagnosis not present

## 2017-04-08 DIAGNOSIS — T8140XA Infection following a procedure, unspecified, initial encounter: Secondary | ICD-10-CM | POA: Diagnosis not present

## 2017-04-08 DIAGNOSIS — R197 Diarrhea, unspecified: Secondary | ICD-10-CM | POA: Diagnosis not present

## 2017-04-08 DIAGNOSIS — E860 Dehydration: Secondary | ICD-10-CM | POA: Diagnosis not present

## 2017-04-08 DIAGNOSIS — Y838 Other surgical procedures as the cause of abnormal reaction of the patient, or of later complication, without mention of misadventure at the time of the procedure: Secondary | ICD-10-CM | POA: Diagnosis not present

## 2017-04-08 DIAGNOSIS — E7800 Pure hypercholesterolemia, unspecified: Secondary | ICD-10-CM | POA: Diagnosis present

## 2017-04-08 DIAGNOSIS — F329 Major depressive disorder, single episode, unspecified: Secondary | ICD-10-CM | POA: Diagnosis not present

## 2017-04-08 DIAGNOSIS — A0472 Enterocolitis due to Clostridium difficile, not specified as recurrent: Secondary | ICD-10-CM | POA: Diagnosis not present

## 2017-04-08 DIAGNOSIS — Z881 Allergy status to other antibiotic agents status: Secondary | ICD-10-CM | POA: Diagnosis not present

## 2017-04-08 DIAGNOSIS — R000 Tachycardia, unspecified: Secondary | ICD-10-CM | POA: Diagnosis not present

## 2017-04-08 DIAGNOSIS — R51 Headache: Secondary | ICD-10-CM | POA: Diagnosis not present

## 2017-04-08 DIAGNOSIS — R42 Dizziness and giddiness: Secondary | ICD-10-CM | POA: Diagnosis not present

## 2017-04-08 DIAGNOSIS — K449 Diaphragmatic hernia without obstruction or gangrene: Secondary | ICD-10-CM | POA: Diagnosis not present

## 2017-04-08 DIAGNOSIS — J40 Bronchitis, not specified as acute or chronic: Secondary | ICD-10-CM | POA: Diagnosis not present

## 2017-04-08 DIAGNOSIS — I4891 Unspecified atrial fibrillation: Secondary | ICD-10-CM | POA: Diagnosis present

## 2017-04-08 DIAGNOSIS — Z79899 Other long term (current) drug therapy: Secondary | ICD-10-CM | POA: Diagnosis not present

## 2017-04-08 DIAGNOSIS — Z7901 Long term (current) use of anticoagulants: Secondary | ICD-10-CM | POA: Diagnosis not present

## 2017-04-08 DIAGNOSIS — Z833 Family history of diabetes mellitus: Secondary | ICD-10-CM | POA: Diagnosis not present

## 2017-04-08 DIAGNOSIS — Z471 Aftercare following joint replacement surgery: Secondary | ICD-10-CM | POA: Diagnosis not present

## 2017-04-08 DIAGNOSIS — E876 Hypokalemia: Secondary | ICD-10-CM | POA: Diagnosis not present

## 2017-04-08 DIAGNOSIS — Z79891 Long term (current) use of opiate analgesic: Secondary | ICD-10-CM | POA: Diagnosis not present

## 2017-04-08 DIAGNOSIS — Z96651 Presence of right artificial knee joint: Secondary | ICD-10-CM | POA: Diagnosis not present

## 2017-04-08 DIAGNOSIS — Z823 Family history of stroke: Secondary | ICD-10-CM | POA: Diagnosis not present

## 2017-04-08 DIAGNOSIS — I10 Essential (primary) hypertension: Secondary | ICD-10-CM | POA: Diagnosis not present

## 2017-04-08 DIAGNOSIS — M810 Age-related osteoporosis without current pathological fracture: Secondary | ICD-10-CM | POA: Diagnosis present

## 2017-04-11 DIAGNOSIS — Z96659 Presence of unspecified artificial knee joint: Secondary | ICD-10-CM | POA: Diagnosis not present

## 2017-04-11 DIAGNOSIS — R51 Headache: Secondary | ICD-10-CM | POA: Diagnosis not present

## 2017-04-11 DIAGNOSIS — Y838 Other surgical procedures as the cause of abnormal reaction of the patient, or of later complication, without mention of misadventure at the time of the procedure: Secondary | ICD-10-CM | POA: Diagnosis not present

## 2017-04-11 DIAGNOSIS — Z471 Aftercare following joint replacement surgery: Secondary | ICD-10-CM | POA: Diagnosis not present

## 2017-04-11 DIAGNOSIS — Z1621 Resistance to vancomycin: Secondary | ICD-10-CM | POA: Diagnosis not present

## 2017-04-11 DIAGNOSIS — T8140XA Infection following a procedure, unspecified, initial encounter: Secondary | ICD-10-CM | POA: Diagnosis not present

## 2017-04-11 DIAGNOSIS — B952 Enterococcus as the cause of diseases classified elsewhere: Secondary | ICD-10-CM | POA: Diagnosis not present

## 2017-04-11 DIAGNOSIS — D72829 Elevated white blood cell count, unspecified: Secondary | ICD-10-CM | POA: Diagnosis not present

## 2017-04-11 DIAGNOSIS — E119 Type 2 diabetes mellitus without complications: Secondary | ICD-10-CM | POA: Diagnosis not present

## 2017-04-11 DIAGNOSIS — I4891 Unspecified atrial fibrillation: Secondary | ICD-10-CM | POA: Diagnosis not present

## 2017-04-11 DIAGNOSIS — J40 Bronchitis, not specified as acute or chronic: Secondary | ICD-10-CM | POA: Diagnosis not present

## 2017-04-11 DIAGNOSIS — N390 Urinary tract infection, site not specified: Secondary | ICD-10-CM | POA: Diagnosis not present

## 2017-04-11 DIAGNOSIS — R197 Diarrhea, unspecified: Secondary | ICD-10-CM | POA: Diagnosis not present

## 2017-04-11 DIAGNOSIS — F419 Anxiety disorder, unspecified: Secondary | ICD-10-CM | POA: Diagnosis not present

## 2017-04-11 DIAGNOSIS — B965 Pseudomonas (aeruginosa) (mallei) (pseudomallei) as the cause of diseases classified elsewhere: Secondary | ICD-10-CM | POA: Diagnosis not present

## 2017-04-11 DIAGNOSIS — I10 Essential (primary) hypertension: Secondary | ICD-10-CM | POA: Diagnosis not present

## 2017-04-11 DIAGNOSIS — Z96651 Presence of right artificial knee joint: Secondary | ICD-10-CM | POA: Diagnosis not present

## 2017-04-11 DIAGNOSIS — M1711 Unilateral primary osteoarthritis, right knee: Secondary | ICD-10-CM | POA: Diagnosis not present

## 2017-04-11 DIAGNOSIS — R278 Other lack of coordination: Secondary | ICD-10-CM | POA: Diagnosis not present

## 2017-04-11 DIAGNOSIS — K219 Gastro-esophageal reflux disease without esophagitis: Secondary | ICD-10-CM | POA: Diagnosis not present

## 2017-04-11 DIAGNOSIS — A0472 Enterocolitis due to Clostridium difficile, not specified as recurrent: Secondary | ICD-10-CM | POA: Diagnosis not present

## 2017-04-11 DIAGNOSIS — F329 Major depressive disorder, single episode, unspecified: Secondary | ICD-10-CM | POA: Diagnosis not present

## 2017-04-11 DIAGNOSIS — N3941 Urge incontinence: Secondary | ICD-10-CM | POA: Diagnosis not present

## 2017-04-12 DIAGNOSIS — Z96651 Presence of right artificial knee joint: Secondary | ICD-10-CM | POA: Diagnosis not present

## 2017-04-12 DIAGNOSIS — F419 Anxiety disorder, unspecified: Secondary | ICD-10-CM | POA: Diagnosis not present

## 2017-04-12 DIAGNOSIS — A0472 Enterocolitis due to Clostridium difficile, not specified as recurrent: Secondary | ICD-10-CM | POA: Diagnosis not present

## 2017-04-12 DIAGNOSIS — N3941 Urge incontinence: Secondary | ICD-10-CM | POA: Diagnosis not present

## 2017-04-17 DIAGNOSIS — N390 Urinary tract infection, site not specified: Secondary | ICD-10-CM | POA: Diagnosis not present

## 2017-04-17 DIAGNOSIS — B965 Pseudomonas (aeruginosa) (mallei) (pseudomallei) as the cause of diseases classified elsewhere: Secondary | ICD-10-CM | POA: Diagnosis not present

## 2017-04-17 DIAGNOSIS — B952 Enterococcus as the cause of diseases classified elsewhere: Secondary | ICD-10-CM | POA: Diagnosis not present

## 2017-04-17 DIAGNOSIS — Z1621 Resistance to vancomycin: Secondary | ICD-10-CM | POA: Diagnosis not present

## 2017-04-18 DIAGNOSIS — Z96651 Presence of right artificial knee joint: Secondary | ICD-10-CM | POA: Diagnosis not present

## 2017-04-18 DIAGNOSIS — M1711 Unilateral primary osteoarthritis, right knee: Secondary | ICD-10-CM | POA: Diagnosis not present

## 2017-04-18 DIAGNOSIS — Z471 Aftercare following joint replacement surgery: Secondary | ICD-10-CM | POA: Diagnosis not present

## 2017-04-22 DIAGNOSIS — Z1621 Resistance to vancomycin: Secondary | ICD-10-CM | POA: Diagnosis not present

## 2017-04-22 DIAGNOSIS — Z96659 Presence of unspecified artificial knee joint: Secondary | ICD-10-CM | POA: Diagnosis not present

## 2017-04-22 DIAGNOSIS — F329 Major depressive disorder, single episode, unspecified: Secondary | ICD-10-CM | POA: Diagnosis not present

## 2017-04-22 DIAGNOSIS — A0472 Enterocolitis due to Clostridium difficile, not specified as recurrent: Secondary | ICD-10-CM | POA: Diagnosis not present

## 2017-05-02 DIAGNOSIS — Z471 Aftercare following joint replacement surgery: Secondary | ICD-10-CM | POA: Diagnosis not present

## 2017-05-02 DIAGNOSIS — Z96651 Presence of right artificial knee joint: Secondary | ICD-10-CM | POA: Diagnosis not present

## 2017-05-08 DIAGNOSIS — E119 Type 2 diabetes mellitus without complications: Secondary | ICD-10-CM | POA: Diagnosis not present

## 2017-05-08 DIAGNOSIS — I4891 Unspecified atrial fibrillation: Secondary | ICD-10-CM | POA: Diagnosis not present

## 2017-05-08 DIAGNOSIS — E7800 Pure hypercholesterolemia, unspecified: Secondary | ICD-10-CM | POA: Diagnosis not present

## 2017-05-08 DIAGNOSIS — Z471 Aftercare following joint replacement surgery: Secondary | ICD-10-CM | POA: Diagnosis not present

## 2017-05-08 DIAGNOSIS — I10 Essential (primary) hypertension: Secondary | ICD-10-CM | POA: Diagnosis not present

## 2017-05-08 DIAGNOSIS — J45909 Unspecified asthma, uncomplicated: Secondary | ICD-10-CM | POA: Diagnosis not present

## 2017-05-08 DIAGNOSIS — F329 Major depressive disorder, single episode, unspecified: Secondary | ICD-10-CM | POA: Diagnosis not present

## 2017-05-08 DIAGNOSIS — M5137 Other intervertebral disc degeneration, lumbosacral region: Secondary | ICD-10-CM | POA: Diagnosis not present

## 2017-05-08 DIAGNOSIS — K219 Gastro-esophageal reflux disease without esophagitis: Secondary | ICD-10-CM | POA: Diagnosis not present

## 2017-05-08 DIAGNOSIS — M810 Age-related osteoporosis without current pathological fracture: Secondary | ICD-10-CM | POA: Diagnosis not present

## 2017-05-08 DIAGNOSIS — R32 Unspecified urinary incontinence: Secondary | ICD-10-CM | POA: Diagnosis not present

## 2017-05-08 DIAGNOSIS — Z7984 Long term (current) use of oral hypoglycemic drugs: Secondary | ICD-10-CM | POA: Diagnosis not present

## 2017-05-08 DIAGNOSIS — Z9181 History of falling: Secondary | ICD-10-CM | POA: Diagnosis not present

## 2017-05-08 DIAGNOSIS — Z7901 Long term (current) use of anticoagulants: Secondary | ICD-10-CM | POA: Diagnosis not present

## 2017-05-08 DIAGNOSIS — F419 Anxiety disorder, unspecified: Secondary | ICD-10-CM | POA: Diagnosis not present

## 2017-05-10 DIAGNOSIS — I10 Essential (primary) hypertension: Secondary | ICD-10-CM | POA: Diagnosis not present

## 2017-05-10 DIAGNOSIS — E119 Type 2 diabetes mellitus without complications: Secondary | ICD-10-CM | POA: Diagnosis not present

## 2017-05-10 DIAGNOSIS — I4891 Unspecified atrial fibrillation: Secondary | ICD-10-CM | POA: Diagnosis not present

## 2017-05-10 DIAGNOSIS — Z471 Aftercare following joint replacement surgery: Secondary | ICD-10-CM | POA: Diagnosis not present

## 2017-05-10 DIAGNOSIS — J45909 Unspecified asthma, uncomplicated: Secondary | ICD-10-CM | POA: Diagnosis not present

## 2017-05-10 DIAGNOSIS — F419 Anxiety disorder, unspecified: Secondary | ICD-10-CM | POA: Diagnosis not present

## 2017-05-13 DIAGNOSIS — F419 Anxiety disorder, unspecified: Secondary | ICD-10-CM | POA: Diagnosis not present

## 2017-05-13 DIAGNOSIS — E119 Type 2 diabetes mellitus without complications: Secondary | ICD-10-CM | POA: Diagnosis not present

## 2017-05-13 DIAGNOSIS — J45909 Unspecified asthma, uncomplicated: Secondary | ICD-10-CM | POA: Diagnosis not present

## 2017-05-13 DIAGNOSIS — I4891 Unspecified atrial fibrillation: Secondary | ICD-10-CM | POA: Diagnosis not present

## 2017-05-13 DIAGNOSIS — I10 Essential (primary) hypertension: Secondary | ICD-10-CM | POA: Diagnosis not present

## 2017-05-13 DIAGNOSIS — Z471 Aftercare following joint replacement surgery: Secondary | ICD-10-CM | POA: Diagnosis not present

## 2017-05-14 DIAGNOSIS — J45909 Unspecified asthma, uncomplicated: Secondary | ICD-10-CM | POA: Diagnosis not present

## 2017-05-14 DIAGNOSIS — Z471 Aftercare following joint replacement surgery: Secondary | ICD-10-CM | POA: Diagnosis not present

## 2017-05-14 DIAGNOSIS — E119 Type 2 diabetes mellitus without complications: Secondary | ICD-10-CM | POA: Diagnosis not present

## 2017-05-14 DIAGNOSIS — I10 Essential (primary) hypertension: Secondary | ICD-10-CM | POA: Diagnosis not present

## 2017-05-14 DIAGNOSIS — I4891 Unspecified atrial fibrillation: Secondary | ICD-10-CM | POA: Diagnosis not present

## 2017-05-14 DIAGNOSIS — F419 Anxiety disorder, unspecified: Secondary | ICD-10-CM | POA: Diagnosis not present

## 2017-05-16 DIAGNOSIS — Z471 Aftercare following joint replacement surgery: Secondary | ICD-10-CM | POA: Diagnosis not present

## 2017-05-16 DIAGNOSIS — Z299 Encounter for prophylactic measures, unspecified: Secondary | ICD-10-CM | POA: Diagnosis not present

## 2017-05-16 DIAGNOSIS — I10 Essential (primary) hypertension: Secondary | ICD-10-CM | POA: Diagnosis not present

## 2017-05-16 DIAGNOSIS — A0472 Enterocolitis due to Clostridium difficile, not specified as recurrent: Secondary | ICD-10-CM | POA: Diagnosis not present

## 2017-05-16 DIAGNOSIS — Z96651 Presence of right artificial knee joint: Secondary | ICD-10-CM | POA: Diagnosis not present

## 2017-05-16 DIAGNOSIS — Z09 Encounter for follow-up examination after completed treatment for conditions other than malignant neoplasm: Secondary | ICD-10-CM | POA: Diagnosis not present

## 2017-05-16 DIAGNOSIS — Z6826 Body mass index (BMI) 26.0-26.9, adult: Secondary | ICD-10-CM | POA: Diagnosis not present

## 2017-05-17 DIAGNOSIS — E119 Type 2 diabetes mellitus without complications: Secondary | ICD-10-CM | POA: Diagnosis not present

## 2017-05-17 DIAGNOSIS — I10 Essential (primary) hypertension: Secondary | ICD-10-CM | POA: Diagnosis not present

## 2017-05-17 DIAGNOSIS — I4891 Unspecified atrial fibrillation: Secondary | ICD-10-CM | POA: Diagnosis not present

## 2017-05-17 DIAGNOSIS — J45909 Unspecified asthma, uncomplicated: Secondary | ICD-10-CM | POA: Diagnosis not present

## 2017-05-17 DIAGNOSIS — F419 Anxiety disorder, unspecified: Secondary | ICD-10-CM | POA: Diagnosis not present

## 2017-05-17 DIAGNOSIS — Z471 Aftercare following joint replacement surgery: Secondary | ICD-10-CM | POA: Diagnosis not present

## 2017-05-21 DIAGNOSIS — E1151 Type 2 diabetes mellitus with diabetic peripheral angiopathy without gangrene: Secondary | ICD-10-CM | POA: Diagnosis not present

## 2017-05-21 DIAGNOSIS — R2689 Other abnormalities of gait and mobility: Secondary | ICD-10-CM | POA: Diagnosis not present

## 2017-05-21 DIAGNOSIS — Z96651 Presence of right artificial knee joint: Secondary | ICD-10-CM | POA: Diagnosis not present

## 2017-05-21 DIAGNOSIS — Z471 Aftercare following joint replacement surgery: Secondary | ICD-10-CM | POA: Diagnosis not present

## 2017-05-21 DIAGNOSIS — M6281 Muscle weakness (generalized): Secondary | ICD-10-CM | POA: Diagnosis not present

## 2017-05-21 DIAGNOSIS — M25561 Pain in right knee: Secondary | ICD-10-CM | POA: Diagnosis not present

## 2017-05-21 DIAGNOSIS — E1140 Type 2 diabetes mellitus with diabetic neuropathy, unspecified: Secondary | ICD-10-CM | POA: Diagnosis not present

## 2017-05-21 DIAGNOSIS — M25661 Stiffness of right knee, not elsewhere classified: Secondary | ICD-10-CM | POA: Diagnosis not present

## 2017-05-23 DIAGNOSIS — M6281 Muscle weakness (generalized): Secondary | ICD-10-CM | POA: Diagnosis not present

## 2017-05-23 DIAGNOSIS — Z96651 Presence of right artificial knee joint: Secondary | ICD-10-CM | POA: Diagnosis not present

## 2017-05-23 DIAGNOSIS — M25661 Stiffness of right knee, not elsewhere classified: Secondary | ICD-10-CM | POA: Diagnosis not present

## 2017-05-23 DIAGNOSIS — M25561 Pain in right knee: Secondary | ICD-10-CM | POA: Diagnosis not present

## 2017-05-23 DIAGNOSIS — Z471 Aftercare following joint replacement surgery: Secondary | ICD-10-CM | POA: Diagnosis not present

## 2017-05-23 DIAGNOSIS — R2689 Other abnormalities of gait and mobility: Secondary | ICD-10-CM | POA: Diagnosis not present

## 2017-05-24 DIAGNOSIS — M6281 Muscle weakness (generalized): Secondary | ICD-10-CM | POA: Diagnosis not present

## 2017-05-24 DIAGNOSIS — R2689 Other abnormalities of gait and mobility: Secondary | ICD-10-CM | POA: Diagnosis not present

## 2017-05-24 DIAGNOSIS — Z471 Aftercare following joint replacement surgery: Secondary | ICD-10-CM | POA: Diagnosis not present

## 2017-05-24 DIAGNOSIS — Z96651 Presence of right artificial knee joint: Secondary | ICD-10-CM | POA: Diagnosis not present

## 2017-05-24 DIAGNOSIS — M25661 Stiffness of right knee, not elsewhere classified: Secondary | ICD-10-CM | POA: Diagnosis not present

## 2017-05-24 DIAGNOSIS — M25561 Pain in right knee: Secondary | ICD-10-CM | POA: Diagnosis not present

## 2017-05-29 DIAGNOSIS — Z471 Aftercare following joint replacement surgery: Secondary | ICD-10-CM | POA: Diagnosis not present

## 2017-05-29 DIAGNOSIS — R2689 Other abnormalities of gait and mobility: Secondary | ICD-10-CM | POA: Diagnosis not present

## 2017-05-29 DIAGNOSIS — M6281 Muscle weakness (generalized): Secondary | ICD-10-CM | POA: Diagnosis not present

## 2017-05-29 DIAGNOSIS — M25661 Stiffness of right knee, not elsewhere classified: Secondary | ICD-10-CM | POA: Diagnosis not present

## 2017-05-29 DIAGNOSIS — M25561 Pain in right knee: Secondary | ICD-10-CM | POA: Diagnosis not present

## 2017-05-29 DIAGNOSIS — Z96651 Presence of right artificial knee joint: Secondary | ICD-10-CM | POA: Diagnosis not present

## 2017-05-31 DIAGNOSIS — Z96651 Presence of right artificial knee joint: Secondary | ICD-10-CM | POA: Diagnosis not present

## 2017-05-31 DIAGNOSIS — M25661 Stiffness of right knee, not elsewhere classified: Secondary | ICD-10-CM | POA: Diagnosis not present

## 2017-05-31 DIAGNOSIS — M25561 Pain in right knee: Secondary | ICD-10-CM | POA: Diagnosis not present

## 2017-05-31 DIAGNOSIS — R2689 Other abnormalities of gait and mobility: Secondary | ICD-10-CM | POA: Diagnosis not present

## 2017-05-31 DIAGNOSIS — Z471 Aftercare following joint replacement surgery: Secondary | ICD-10-CM | POA: Diagnosis not present

## 2017-05-31 DIAGNOSIS — M6281 Muscle weakness (generalized): Secondary | ICD-10-CM | POA: Diagnosis not present

## 2017-06-03 DIAGNOSIS — M25561 Pain in right knee: Secondary | ICD-10-CM | POA: Diagnosis not present

## 2017-06-03 DIAGNOSIS — Z471 Aftercare following joint replacement surgery: Secondary | ICD-10-CM | POA: Diagnosis not present

## 2017-06-03 DIAGNOSIS — R2689 Other abnormalities of gait and mobility: Secondary | ICD-10-CM | POA: Diagnosis not present

## 2017-06-03 DIAGNOSIS — M6281 Muscle weakness (generalized): Secondary | ICD-10-CM | POA: Diagnosis not present

## 2017-06-03 DIAGNOSIS — Z96651 Presence of right artificial knee joint: Secondary | ICD-10-CM | POA: Diagnosis not present

## 2017-06-03 DIAGNOSIS — M25661 Stiffness of right knee, not elsewhere classified: Secondary | ICD-10-CM | POA: Diagnosis not present

## 2017-06-04 DIAGNOSIS — Z96651 Presence of right artificial knee joint: Secondary | ICD-10-CM | POA: Diagnosis not present

## 2017-06-04 DIAGNOSIS — M25561 Pain in right knee: Secondary | ICD-10-CM | POA: Diagnosis not present

## 2017-06-04 DIAGNOSIS — M6281 Muscle weakness (generalized): Secondary | ICD-10-CM | POA: Diagnosis not present

## 2017-06-04 DIAGNOSIS — R2689 Other abnormalities of gait and mobility: Secondary | ICD-10-CM | POA: Diagnosis not present

## 2017-06-04 DIAGNOSIS — M25661 Stiffness of right knee, not elsewhere classified: Secondary | ICD-10-CM | POA: Diagnosis not present

## 2017-06-04 DIAGNOSIS — Z471 Aftercare following joint replacement surgery: Secondary | ICD-10-CM | POA: Diagnosis not present

## 2017-06-06 DIAGNOSIS — Z471 Aftercare following joint replacement surgery: Secondary | ICD-10-CM | POA: Diagnosis not present

## 2017-06-06 DIAGNOSIS — M1711 Unilateral primary osteoarthritis, right knee: Secondary | ICD-10-CM | POA: Diagnosis not present

## 2017-06-06 DIAGNOSIS — Z96651 Presence of right artificial knee joint: Secondary | ICD-10-CM | POA: Diagnosis not present

## 2017-06-07 DIAGNOSIS — M25561 Pain in right knee: Secondary | ICD-10-CM | POA: Diagnosis not present

## 2017-06-07 DIAGNOSIS — R2689 Other abnormalities of gait and mobility: Secondary | ICD-10-CM | POA: Diagnosis not present

## 2017-06-07 DIAGNOSIS — M6281 Muscle weakness (generalized): Secondary | ICD-10-CM | POA: Diagnosis not present

## 2017-06-07 DIAGNOSIS — Z96651 Presence of right artificial knee joint: Secondary | ICD-10-CM | POA: Diagnosis not present

## 2017-06-07 DIAGNOSIS — M25661 Stiffness of right knee, not elsewhere classified: Secondary | ICD-10-CM | POA: Diagnosis not present

## 2017-06-07 DIAGNOSIS — Z471 Aftercare following joint replacement surgery: Secondary | ICD-10-CM | POA: Diagnosis not present

## 2017-06-12 DIAGNOSIS — M25561 Pain in right knee: Secondary | ICD-10-CM | POA: Diagnosis not present

## 2017-06-12 DIAGNOSIS — R2689 Other abnormalities of gait and mobility: Secondary | ICD-10-CM | POA: Diagnosis not present

## 2017-06-12 DIAGNOSIS — Z471 Aftercare following joint replacement surgery: Secondary | ICD-10-CM | POA: Diagnosis not present

## 2017-06-12 DIAGNOSIS — M6281 Muscle weakness (generalized): Secondary | ICD-10-CM | POA: Diagnosis not present

## 2017-06-12 DIAGNOSIS — M25661 Stiffness of right knee, not elsewhere classified: Secondary | ICD-10-CM | POA: Diagnosis not present

## 2017-06-12 DIAGNOSIS — Z96651 Presence of right artificial knee joint: Secondary | ICD-10-CM | POA: Diagnosis not present

## 2017-06-13 DIAGNOSIS — M25561 Pain in right knee: Secondary | ICD-10-CM | POA: Diagnosis not present

## 2017-06-13 DIAGNOSIS — R2689 Other abnormalities of gait and mobility: Secondary | ICD-10-CM | POA: Diagnosis not present

## 2017-06-13 DIAGNOSIS — Z471 Aftercare following joint replacement surgery: Secondary | ICD-10-CM | POA: Diagnosis not present

## 2017-06-13 DIAGNOSIS — M6281 Muscle weakness (generalized): Secondary | ICD-10-CM | POA: Diagnosis not present

## 2017-06-13 DIAGNOSIS — Z96651 Presence of right artificial knee joint: Secondary | ICD-10-CM | POA: Diagnosis not present

## 2017-06-13 DIAGNOSIS — M25661 Stiffness of right knee, not elsewhere classified: Secondary | ICD-10-CM | POA: Diagnosis not present

## 2017-06-17 DIAGNOSIS — Z471 Aftercare following joint replacement surgery: Secondary | ICD-10-CM | POA: Diagnosis not present

## 2017-06-17 DIAGNOSIS — M25661 Stiffness of right knee, not elsewhere classified: Secondary | ICD-10-CM | POA: Diagnosis not present

## 2017-06-17 DIAGNOSIS — Z96651 Presence of right artificial knee joint: Secondary | ICD-10-CM | POA: Diagnosis not present

## 2017-06-17 DIAGNOSIS — R2689 Other abnormalities of gait and mobility: Secondary | ICD-10-CM | POA: Diagnosis not present

## 2017-06-17 DIAGNOSIS — M25561 Pain in right knee: Secondary | ICD-10-CM | POA: Diagnosis not present

## 2017-06-17 DIAGNOSIS — M6281 Muscle weakness (generalized): Secondary | ICD-10-CM | POA: Diagnosis not present

## 2017-06-22 DIAGNOSIS — M545 Low back pain: Secondary | ICD-10-CM | POA: Diagnosis not present

## 2017-06-25 DIAGNOSIS — M25561 Pain in right knee: Secondary | ICD-10-CM | POA: Diagnosis not present

## 2017-06-25 DIAGNOSIS — M545 Low back pain: Secondary | ICD-10-CM | POA: Diagnosis not present

## 2017-06-27 DIAGNOSIS — M25561 Pain in right knee: Secondary | ICD-10-CM | POA: Diagnosis not present

## 2017-06-27 DIAGNOSIS — M545 Low back pain: Secondary | ICD-10-CM | POA: Diagnosis not present

## 2017-07-02 DIAGNOSIS — M545 Low back pain: Secondary | ICD-10-CM | POA: Diagnosis not present

## 2017-07-02 DIAGNOSIS — M25561 Pain in right knee: Secondary | ICD-10-CM | POA: Diagnosis not present

## 2017-07-03 DIAGNOSIS — Z713 Dietary counseling and surveillance: Secondary | ICD-10-CM | POA: Diagnosis not present

## 2017-07-03 DIAGNOSIS — Z299 Encounter for prophylactic measures, unspecified: Secondary | ICD-10-CM | POA: Diagnosis not present

## 2017-07-03 DIAGNOSIS — Z789 Other specified health status: Secondary | ICD-10-CM | POA: Diagnosis not present

## 2017-07-03 DIAGNOSIS — Z6824 Body mass index (BMI) 24.0-24.9, adult: Secondary | ICD-10-CM | POA: Diagnosis not present

## 2017-07-03 DIAGNOSIS — B370 Candidal stomatitis: Secondary | ICD-10-CM | POA: Diagnosis not present

## 2017-07-03 DIAGNOSIS — I10 Essential (primary) hypertension: Secondary | ICD-10-CM | POA: Diagnosis not present

## 2017-07-08 ENCOUNTER — Telehealth (INDEPENDENT_AMBULATORY_CARE_PROVIDER_SITE_OTHER): Payer: Self-pay | Admitting: *Deleted

## 2017-07-08 ENCOUNTER — Telehealth (INDEPENDENT_AMBULATORY_CARE_PROVIDER_SITE_OTHER): Payer: Self-pay | Admitting: Internal Medicine

## 2017-07-08 DIAGNOSIS — R197 Diarrhea, unspecified: Secondary | ICD-10-CM

## 2017-07-08 NOTE — Telephone Encounter (Signed)
I have spoken with patient.  GI pathogen ordered

## 2017-07-08 NOTE — Telephone Encounter (Addendum)
Patient called in and left message for return call.   Called patient back She is having issues with diarrhea again and has had c-diff from knee replacement.  Uncontrollable --taking something otc but not helping much.   can you call her and advise stool studies and she inquired about her last TCS  2014 normal.  [phone]

## 2017-07-08 NOTE — Telephone Encounter (Signed)
Gi pathogen ordered

## 2017-07-09 DIAGNOSIS — M545 Low back pain: Secondary | ICD-10-CM | POA: Diagnosis not present

## 2017-07-09 DIAGNOSIS — M25561 Pain in right knee: Secondary | ICD-10-CM | POA: Diagnosis not present

## 2017-07-11 DIAGNOSIS — M25561 Pain in right knee: Secondary | ICD-10-CM | POA: Diagnosis not present

## 2017-07-11 DIAGNOSIS — M545 Low back pain: Secondary | ICD-10-CM | POA: Diagnosis not present

## 2017-07-15 ENCOUNTER — Encounter: Payer: Self-pay | Admitting: *Deleted

## 2017-07-15 ENCOUNTER — Other Ambulatory Visit: Payer: Self-pay

## 2017-07-15 ENCOUNTER — Ambulatory Visit (INDEPENDENT_AMBULATORY_CARE_PROVIDER_SITE_OTHER): Payer: Medicare Other | Admitting: Cardiovascular Disease

## 2017-07-15 ENCOUNTER — Encounter: Payer: Self-pay | Admitting: Cardiovascular Disease

## 2017-07-15 VITALS — BP 120/74 | HR 80 | Ht 63.0 in | Wt 131.0 lb

## 2017-07-15 DIAGNOSIS — I480 Paroxysmal atrial fibrillation: Secondary | ICD-10-CM | POA: Diagnosis not present

## 2017-07-15 DIAGNOSIS — Z5181 Encounter for therapeutic drug level monitoring: Secondary | ICD-10-CM | POA: Diagnosis not present

## 2017-07-15 DIAGNOSIS — Z7901 Long term (current) use of anticoagulants: Secondary | ICD-10-CM

## 2017-07-15 NOTE — Progress Notes (Signed)
SUBJECTIVE: The patient presents for follow-up of paroxysmal atrial fibrillation.  Echocardiogram 05/18/16: Normal left ventricular systolic function, LVEF 34-19%, grade 1 diastolic dysfunction. The left atrium was normal in size.  She underwent right total knee arthroplasty in October 2018.  The patient denies any symptoms of chest pain, palpitations, shortness of breath, lightheadedness, dizziness, leg swelling, orthopnea, PND, and syncope.  She asked again about coming off of metoprolol and Xarelto.  When she did have rapid atrial fibrillation, she was without symptoms.  She said she now takes metoprolol once daily.    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 Son in [place], daughter in [place].    Review of Systems: As per "subjective", otherwise negative.  Allergies  Allergen Reactions  . Levofloxacin Anaphylaxis and Rash  . Cardizem [Diltiazem] Other (See Comments)    Patient states that she could not think, felt that she was in the twilight zone. Arm discomfort.  . Beef-Derived Products   . Sulfa Antibiotics Itching  . Codeine Sulfate Nausea Only  . Diazepam Nausea Only    Current Outpatient Medications  Medication Sig Dispense Refill  . acyclovir (ZOVIRAX) 400 MG tablet Take 400 mg by mouth daily.    Marland Kitchen ALPRAZolam (XANAX) 0.25 MG tablet Take 1-2 tablets (0.25-0.5 mg total) by mouth at bedtime as needed (for sleep.). 30 tablet 0  . ALPRAZolam (XANAX) 0.5 MG tablet Take 0.25-0.5 mg by mouth at bedtime as needed (for sleep.).    Marland Kitchen bisacodyl (DULCOLAX) 5 MG EC tablet Take 1 tablet (5 mg total) by mouth daily as needed for moderate constipation. 30 tablet 0  . cetirizine (ZYRTEC) 10 MG tablet Take 10 mg by mouth daily.     Marland Kitchen glimepiride (AMARYL) 2 MG tablet Take 2 mg by mouth 2 (two) times daily.     Marland Kitchen HYDROmorphone (DILAUDID) 2 MG tablet Take 1 tablet (2 mg total) by mouth every 4 (four) hours as needed for moderate pain. 60 tablet 0  .  metFORMIN (GLUCOPHAGE-XR) 500 MG 24 hr tablet Take 500 mg by mouth 2 (two) times daily.    . metoprolol (LOPRESSOR) 50 MG tablet Take 50 mg by mouth daily after breakfast.     . oxybutynin (DITROPAN-XL) 10 MG 24 hr tablet Take 10 mg by mouth daily.    . pantoprazole (PROTONIX) 40 MG tablet Take 1 tablet (40 mg total) by mouth at bedtime. 30 tablet 5  . polyethylene glycol (MIRALAX / GLYCOLAX) packet Take 17 g by mouth daily as needed for mild constipation. 14 each 0  . rivaroxaban (XARELTO) 20 MG TABS tablet Take 20 mg by mouth daily with supper.    . methocarbamol (ROBAXIN) 500 MG tablet Take 1 tablet (500 mg total) by mouth every 6 (six) hours as needed for muscle spasms. 60 tablet 0   No current facility-administered medications for this visit.     Past Medical History:  Diagnosis Date  . Arthritis   . Concussion    2015  . Depression   . Diabetes ([place])    type 2  . Dysphagia   . Dysrhythmia    afib  . GERD (gastroesophageal reflux disease)   . Headache   . History of bronchitis   . Seasonal allergies   . Toenail fungus     Past Surgical History:  Procedure Laterality Date  . APPENDECTOMY    . BACK SURGERY     x2  . BOTOX INJECTION N/A 06/06/2016  Procedure: BOTOX INJECTION;  Surgeon: Rogene Houston, MD;  [facility];  Service: Endoscopy;  Laterality: N/A;  . CHOLECYSTECTOMY    . COLONOSCOPY N/A 11/05/2012   Procedure: COLONOSCOPY;  Surgeon: Rogene Houston, MD;  [facility];  Service: Endoscopy;  Laterality: N/A;  1030  . ESOPHAGEAL DILATION N/A 07/06/2015   Procedure: ESOPHAGEAL DILATION;  Surgeon: Rogene Houston, MD;  [facility];  Service: Endoscopy;  Laterality: N/A;  . ESOPHAGEAL DILATION N/A 06/06/2016   Procedure: ESOPHAGEAL DILATION;  Surgeon: Rogene Houston, MD;  [facility];  Service: Endoscopy;  Laterality: N/A;  . ESOPHAGOGASTRODUODENOSCOPY N/A 02/12/2014   Procedure: ESOPHAGOGASTRODUODENOSCOPY (EGD);   Surgeon: Rogene Houston, MD;  [facility];  Service: Endoscopy;  Laterality: N/A;  150  . ESOPHAGOGASTRODUODENOSCOPY N/A 07/06/2015   Procedure: ESOPHAGOGASTRODUODENOSCOPY (EGD);  Surgeon: Rogene Houston, MD;  [facility];  Service: Endoscopy;  Laterality: N/A;  1:25 - moved to 1/18 @ 10:30 - Ann to notify pt  . ESOPHAGOGASTRODUODENOSCOPY (EGD) WITH ESOPHAGEAL DILATION N/A 07/25/2012   Procedure: ESOPHAGOGASTRODUODENOSCOPY (EGD) WITH ESOPHAGEAL DILATION;  Surgeon: Rogene Houston, MD;  [facility];  Service: Endoscopy;  Laterality: N/A;  325-rescheduled to [place] notified pt  . ESOPHAGOGASTRODUODENOSCOPY (EGD) WITH PROPOFOL N/A 06/06/2016   Procedure: ESOPHAGOGASTRODUODENOSCOPY (EGD) WITH PROPOFOL;  Surgeon: Rogene Houston, MD;  [facility];  Service: Endoscopy;  Laterality: N/A;  . EYE SURGERY     cataracts removed  . Foot surgeries Bilateral    hammer toes  . MALONEY DILATION N/A 02/12/2014   Procedure: Venia Minks DILATION;  Surgeon: Rogene Houston, MD;  [facility];  Service: Endoscopy;  Laterality: N/A;  . Right knee arthroscopy     x2  . TONSILLECTOMY    . TOTAL ABDOMINAL HYSTERECTOMY    . TOTAL KNEE ARTHROPLASTY Right 04/03/2017   Procedure: RIGHT TOTAL KNEE ARTHROPLASTY;  Surgeon: Latanya Maudlin, MD;  [facility];  Service: Orthopedics;  Laterality: Right;    Social History   Socioeconomic History  . Marital status: Divorced    Spouse name: Not on file  . Number of children: Not on file  . Years of education: Not on file  . Highest education level: Not on file  Social Needs  . Financial resource strain: Not on file  . Food insecurity - worry: Not on file  . Food insecurity - inability: Not on file  . Transportation needs - medical: Not on file  . Transportation needs - non-medical: Not on file  Occupational History  . Not on file  Tobacco Use  . Smoking status: Never Smoker  . Smokeless tobacco: Never Used    Substance and Sexual Activity  . Alcohol use: No    Alcohol/week: 0.0 oz    Comment: socially   . Drug use: No  . Sexual activity: Not Currently    Partners: Male    Birth control/protection: None  Other Topics Concern  . Not on file  Social History Narrative  . Not on file     Vitals:   07/15/17 1631  BP: 120/74  Pulse: 80  SpO2: 98%  Weight: 131 lb (59.4 kg)  Height: 5\' 3"  (1.6 m)    Wt Readings from Last 3 Encounters:  07/15/17 131 lb (59.4 kg)  04/03/17 141 lb (64 kg)  03/25/17 141 lb (64 kg)     PHYSICAL EXAM General: NAD HEENT: Normal. Neck: No  JVD, no thyromegaly. Lungs: Clear to auscultation bilaterally with normal respiratory effort. CV: Regular rate and rhythm, normal S1/S2, no S3/S4, no murmur. No pretibial or periankle edema.  No carotid bruit.   Abdomen: Soft, nontender, no distention.  Neurologic: Alert and oriented.  Psych: Normal affect. Skin: Normal. Musculoskeletal: No gross deformities.    ECG: Most recent ECG reviewed.   Labs: Lab Results  Component Value Date/Time   K 3.6 04/05/2017 06:04 AM   BUN 8 04/05/2017 06:04 AM   CREATININE 0.63 04/05/2017 06:04 AM   ALT 22 03/25/2017 11:46 AM   TSH 1.118 05/18/2016 07:23 PM   HGB 12.3 04/06/2017 06:17 AM     Lipids: No results found for: LDLCALC, LDLDIRECT, CHOL, TRIG, HDL     ASSESSMENT AND PLAN:  1. Paroxysmal atrial fibrillation:Symptomatically stable on metoprolol. CHADSVASC score is 40 (age, gender, diabetes), thus thromboembolic risk is elevated.   We had a long talk about the importance of systemic anticoagulation for thromboembolic risk reduction.  Continue Xarelto 20 mg daily.     Disposition: Follow up 1 year   Kate Sable, M.D., F.A.C.C.

## 2017-07-15 NOTE — Patient Instructions (Addendum)
Medication Instructions:  Continue all current medications.  Labwork: none  Testing/Procedures: None  Follow-Up: Your physician wants you to follow up in:  1 year.  You will receive a reminder letter in the mail one-two months in advance.  If you don't receive a letter, please call our office to schedule the follow up appointment   Any Other Special Instructions Will Be Listed Below (If Applicable).  If you need a refill on your cardiac medications before your next appointment, please call your pharmacy.

## 2017-07-16 ENCOUNTER — Telehealth: Payer: Self-pay | Admitting: Neurology

## 2017-07-16 DIAGNOSIS — M545 Low back pain: Secondary | ICD-10-CM | POA: Diagnosis not present

## 2017-07-16 DIAGNOSIS — M25561 Pain in right knee: Secondary | ICD-10-CM | POA: Diagnosis not present

## 2017-07-16 NOTE — Telephone Encounter (Signed)
Patient called and asked could you call or send her the name of the medication that she was taking for her headaches? Thanks

## 2017-07-17 DIAGNOSIS — M25561 Pain in right knee: Secondary | ICD-10-CM | POA: Diagnosis not present

## 2017-07-17 DIAGNOSIS — M545 Low back pain: Secondary | ICD-10-CM | POA: Diagnosis not present

## 2017-07-17 NOTE — Telephone Encounter (Signed)
Called Pt, LM on VM indicating she came to [place] taking gabapentin, generic for Neurontin, and was advsd if headaches continued, it was recommended she take it every night as opposed to PRN. Advsd Pt to call with any questions.

## 2017-07-23 DIAGNOSIS — Z96651 Presence of right artificial knee joint: Secondary | ICD-10-CM | POA: Diagnosis not present

## 2017-07-31 DIAGNOSIS — Z299 Encounter for prophylactic measures, unspecified: Secondary | ICD-10-CM | POA: Diagnosis not present

## 2017-07-31 DIAGNOSIS — E7800 Pure hypercholesterolemia, unspecified: Secondary | ICD-10-CM | POA: Diagnosis not present

## 2017-07-31 DIAGNOSIS — Z6824 Body mass index (BMI) 24.0-24.9, adult: Secondary | ICD-10-CM | POA: Diagnosis not present

## 2017-07-31 DIAGNOSIS — I4891 Unspecified atrial fibrillation: Secondary | ICD-10-CM | POA: Diagnosis not present

## 2017-07-31 DIAGNOSIS — J329 Chronic sinusitis, unspecified: Secondary | ICD-10-CM | POA: Diagnosis not present

## 2017-07-31 DIAGNOSIS — I10 Essential (primary) hypertension: Secondary | ICD-10-CM | POA: Diagnosis not present

## 2017-07-31 DIAGNOSIS — E1165 Type 2 diabetes mellitus with hyperglycemia: Secondary | ICD-10-CM | POA: Diagnosis not present

## 2017-08-05 ENCOUNTER — Other Ambulatory Visit: Payer: Self-pay | Admitting: Cardiovascular Disease

## 2017-08-13 DIAGNOSIS — E1140 Type 2 diabetes mellitus with diabetic neuropathy, unspecified: Secondary | ICD-10-CM | POA: Diagnosis not present

## 2017-08-13 DIAGNOSIS — E1151 Type 2 diabetes mellitus with diabetic peripheral angiopathy without gangrene: Secondary | ICD-10-CM | POA: Diagnosis not present

## 2017-08-26 DIAGNOSIS — G441 Vascular headache, not elsewhere classified: Secondary | ICD-10-CM | POA: Diagnosis not present

## 2017-08-26 DIAGNOSIS — M4004 Postural kyphosis, thoracic region: Secondary | ICD-10-CM | POA: Diagnosis not present

## 2017-08-26 DIAGNOSIS — M9903 Segmental and somatic dysfunction of lumbar region: Secondary | ICD-10-CM | POA: Diagnosis not present

## 2017-08-26 DIAGNOSIS — M9901 Segmental and somatic dysfunction of cervical region: Secondary | ICD-10-CM | POA: Diagnosis not present

## 2017-08-26 DIAGNOSIS — S39012A Strain of muscle, fascia and tendon of lower back, initial encounter: Secondary | ICD-10-CM | POA: Diagnosis not present

## 2017-08-26 DIAGNOSIS — M9902 Segmental and somatic dysfunction of thoracic region: Secondary | ICD-10-CM | POA: Diagnosis not present

## 2017-08-26 DIAGNOSIS — M47812 Spondylosis without myelopathy or radiculopathy, cervical region: Secondary | ICD-10-CM | POA: Diagnosis not present

## 2017-08-28 DIAGNOSIS — M9901 Segmental and somatic dysfunction of cervical region: Secondary | ICD-10-CM | POA: Diagnosis not present

## 2017-08-28 DIAGNOSIS — M9902 Segmental and somatic dysfunction of thoracic region: Secondary | ICD-10-CM | POA: Diagnosis not present

## 2017-08-28 DIAGNOSIS — M47812 Spondylosis without myelopathy or radiculopathy, cervical region: Secondary | ICD-10-CM | POA: Diagnosis not present

## 2017-08-28 DIAGNOSIS — M4004 Postural kyphosis, thoracic region: Secondary | ICD-10-CM | POA: Diagnosis not present

## 2017-08-28 DIAGNOSIS — G441 Vascular headache, not elsewhere classified: Secondary | ICD-10-CM | POA: Diagnosis not present

## 2017-08-28 DIAGNOSIS — S39012A Strain of muscle, fascia and tendon of lower back, initial encounter: Secondary | ICD-10-CM | POA: Diagnosis not present

## 2017-08-28 DIAGNOSIS — M9903 Segmental and somatic dysfunction of lumbar region: Secondary | ICD-10-CM | POA: Diagnosis not present

## 2017-09-05 DIAGNOSIS — Z6825 Body mass index (BMI) 25.0-25.9, adult: Secondary | ICD-10-CM | POA: Diagnosis not present

## 2017-09-05 DIAGNOSIS — I4891 Unspecified atrial fibrillation: Secondary | ICD-10-CM | POA: Diagnosis not present

## 2017-09-05 DIAGNOSIS — J069 Acute upper respiratory infection, unspecified: Secondary | ICD-10-CM | POA: Diagnosis not present

## 2017-09-05 DIAGNOSIS — H9202 Otalgia, left ear: Secondary | ICD-10-CM | POA: Diagnosis not present

## 2017-09-05 DIAGNOSIS — E7800 Pure hypercholesterolemia, unspecified: Secondary | ICD-10-CM | POA: Diagnosis not present

## 2017-09-05 DIAGNOSIS — I10 Essential (primary) hypertension: Secondary | ICD-10-CM | POA: Diagnosis not present

## 2017-09-05 DIAGNOSIS — Z299 Encounter for prophylactic measures, unspecified: Secondary | ICD-10-CM | POA: Diagnosis not present

## 2017-09-11 DIAGNOSIS — G441 Vascular headache, not elsewhere classified: Secondary | ICD-10-CM | POA: Diagnosis not present

## 2017-09-11 DIAGNOSIS — S39012A Strain of muscle, fascia and tendon of lower back, initial encounter: Secondary | ICD-10-CM | POA: Diagnosis not present

## 2017-09-11 DIAGNOSIS — M9901 Segmental and somatic dysfunction of cervical region: Secondary | ICD-10-CM | POA: Diagnosis not present

## 2017-09-11 DIAGNOSIS — M4004 Postural kyphosis, thoracic region: Secondary | ICD-10-CM | POA: Diagnosis not present

## 2017-09-11 DIAGNOSIS — M47812 Spondylosis without myelopathy or radiculopathy, cervical region: Secondary | ICD-10-CM | POA: Diagnosis not present

## 2017-09-11 DIAGNOSIS — M9902 Segmental and somatic dysfunction of thoracic region: Secondary | ICD-10-CM | POA: Diagnosis not present

## 2017-09-11 DIAGNOSIS — M9903 Segmental and somatic dysfunction of lumbar region: Secondary | ICD-10-CM | POA: Diagnosis not present

## 2017-10-24 DIAGNOSIS — Z6825 Body mass index (BMI) 25.0-25.9, adult: Secondary | ICD-10-CM | POA: Diagnosis not present

## 2017-10-24 DIAGNOSIS — I10 Essential (primary) hypertension: Secondary | ICD-10-CM | POA: Diagnosis not present

## 2017-10-24 DIAGNOSIS — Z713 Dietary counseling and surveillance: Secondary | ICD-10-CM | POA: Diagnosis not present

## 2017-10-24 DIAGNOSIS — W57XXXA Bitten or stung by nonvenomous insect and other nonvenomous arthropods, initial encounter: Secondary | ICD-10-CM | POA: Diagnosis not present

## 2017-10-24 DIAGNOSIS — Z299 Encounter for prophylactic measures, unspecified: Secondary | ICD-10-CM | POA: Diagnosis not present

## 2017-11-06 DIAGNOSIS — I10 Essential (primary) hypertension: Secondary | ICD-10-CM | POA: Diagnosis not present

## 2017-11-06 DIAGNOSIS — Z6825 Body mass index (BMI) 25.0-25.9, adult: Secondary | ICD-10-CM | POA: Diagnosis not present

## 2017-11-06 DIAGNOSIS — E1165 Type 2 diabetes mellitus with hyperglycemia: Secondary | ICD-10-CM | POA: Diagnosis not present

## 2017-11-06 DIAGNOSIS — I4891 Unspecified atrial fibrillation: Secondary | ICD-10-CM | POA: Diagnosis not present

## 2017-11-06 DIAGNOSIS — Z299 Encounter for prophylactic measures, unspecified: Secondary | ICD-10-CM | POA: Diagnosis not present

## 2017-11-14 DIAGNOSIS — I4891 Unspecified atrial fibrillation: Secondary | ICD-10-CM | POA: Diagnosis not present

## 2017-11-14 DIAGNOSIS — E7800 Pure hypercholesterolemia, unspecified: Secondary | ICD-10-CM | POA: Diagnosis not present

## 2017-11-14 DIAGNOSIS — E119 Type 2 diabetes mellitus without complications: Secondary | ICD-10-CM | POA: Diagnosis not present

## 2017-11-18 ENCOUNTER — Ambulatory Visit (INDEPENDENT_AMBULATORY_CARE_PROVIDER_SITE_OTHER): Payer: Medicare Other | Admitting: Otolaryngology

## 2017-11-18 DIAGNOSIS — R040 Epistaxis: Secondary | ICD-10-CM | POA: Diagnosis not present

## 2017-12-03 DIAGNOSIS — E1151 Type 2 diabetes mellitus with diabetic peripheral angiopathy without gangrene: Secondary | ICD-10-CM | POA: Diagnosis not present

## 2017-12-03 DIAGNOSIS — E1140 Type 2 diabetes mellitus with diabetic neuropathy, unspecified: Secondary | ICD-10-CM | POA: Diagnosis not present

## 2018-01-14 DIAGNOSIS — G441 Vascular headache, not elsewhere classified: Secondary | ICD-10-CM | POA: Diagnosis not present

## 2018-01-14 DIAGNOSIS — M4004 Postural kyphosis, thoracic region: Secondary | ICD-10-CM | POA: Diagnosis not present

## 2018-01-14 DIAGNOSIS — M9903 Segmental and somatic dysfunction of lumbar region: Secondary | ICD-10-CM | POA: Diagnosis not present

## 2018-01-14 DIAGNOSIS — S39012A Strain of muscle, fascia and tendon of lower back, initial encounter: Secondary | ICD-10-CM | POA: Diagnosis not present

## 2018-01-14 DIAGNOSIS — M47812 Spondylosis without myelopathy or radiculopathy, cervical region: Secondary | ICD-10-CM | POA: Diagnosis not present

## 2018-01-14 DIAGNOSIS — M9901 Segmental and somatic dysfunction of cervical region: Secondary | ICD-10-CM | POA: Diagnosis not present

## 2018-01-14 DIAGNOSIS — M9902 Segmental and somatic dysfunction of thoracic region: Secondary | ICD-10-CM | POA: Diagnosis not present

## 2018-01-15 DIAGNOSIS — Z96651 Presence of right artificial knee joint: Secondary | ICD-10-CM | POA: Diagnosis not present

## 2018-01-15 DIAGNOSIS — Z471 Aftercare following joint replacement surgery: Secondary | ICD-10-CM | POA: Diagnosis not present

## 2018-01-15 DIAGNOSIS — M1711 Unilateral primary osteoarthritis, right knee: Secondary | ICD-10-CM | POA: Diagnosis not present

## 2018-01-20 ENCOUNTER — Ambulatory Visit (INDEPENDENT_AMBULATORY_CARE_PROVIDER_SITE_OTHER): Payer: Medicare Other | Admitting: Otolaryngology

## 2018-01-30 DIAGNOSIS — M545 Low back pain: Secondary | ICD-10-CM | POA: Diagnosis not present

## 2018-01-30 DIAGNOSIS — I4891 Unspecified atrial fibrillation: Secondary | ICD-10-CM | POA: Diagnosis not present

## 2018-01-30 DIAGNOSIS — Z6826 Body mass index (BMI) 26.0-26.9, adult: Secondary | ICD-10-CM | POA: Diagnosis not present

## 2018-01-30 DIAGNOSIS — I10 Essential (primary) hypertension: Secondary | ICD-10-CM | POA: Diagnosis not present

## 2018-01-30 DIAGNOSIS — E1165 Type 2 diabetes mellitus with hyperglycemia: Secondary | ICD-10-CM | POA: Diagnosis not present

## 2018-01-30 DIAGNOSIS — Z299 Encounter for prophylactic measures, unspecified: Secondary | ICD-10-CM | POA: Diagnosis not present

## 2018-02-10 ENCOUNTER — Ambulatory Visit (INDEPENDENT_AMBULATORY_CARE_PROVIDER_SITE_OTHER): Payer: Medicare Other | Admitting: Otolaryngology

## 2018-02-10 DIAGNOSIS — H903 Sensorineural hearing loss, bilateral: Secondary | ICD-10-CM

## 2018-02-10 DIAGNOSIS — R040 Epistaxis: Secondary | ICD-10-CM | POA: Diagnosis not present

## 2018-02-10 DIAGNOSIS — H838X3 Other specified diseases of inner ear, bilateral: Secondary | ICD-10-CM | POA: Diagnosis not present

## 2018-02-10 DIAGNOSIS — H9313 Tinnitus, bilateral: Secondary | ICD-10-CM

## 2018-02-11 DIAGNOSIS — M4004 Postural kyphosis, thoracic region: Secondary | ICD-10-CM | POA: Diagnosis not present

## 2018-02-11 DIAGNOSIS — S39012A Strain of muscle, fascia and tendon of lower back, initial encounter: Secondary | ICD-10-CM | POA: Diagnosis not present

## 2018-02-11 DIAGNOSIS — M9903 Segmental and somatic dysfunction of lumbar region: Secondary | ICD-10-CM | POA: Diagnosis not present

## 2018-02-11 DIAGNOSIS — M9901 Segmental and somatic dysfunction of cervical region: Secondary | ICD-10-CM | POA: Diagnosis not present

## 2018-02-11 DIAGNOSIS — M47812 Spondylosis without myelopathy or radiculopathy, cervical region: Secondary | ICD-10-CM | POA: Diagnosis not present

## 2018-02-11 DIAGNOSIS — G441 Vascular headache, not elsewhere classified: Secondary | ICD-10-CM | POA: Diagnosis not present

## 2018-02-11 DIAGNOSIS — M9902 Segmental and somatic dysfunction of thoracic region: Secondary | ICD-10-CM | POA: Diagnosis not present

## 2018-02-18 DIAGNOSIS — Z299 Encounter for prophylactic measures, unspecified: Secondary | ICD-10-CM | POA: Diagnosis not present

## 2018-02-18 DIAGNOSIS — E7800 Pure hypercholesterolemia, unspecified: Secondary | ICD-10-CM | POA: Diagnosis not present

## 2018-02-18 DIAGNOSIS — I4891 Unspecified atrial fibrillation: Secondary | ICD-10-CM | POA: Diagnosis not present

## 2018-02-18 DIAGNOSIS — E1165 Type 2 diabetes mellitus with hyperglycemia: Secondary | ICD-10-CM | POA: Diagnosis not present

## 2018-02-18 DIAGNOSIS — Z6826 Body mass index (BMI) 26.0-26.9, adult: Secondary | ICD-10-CM | POA: Diagnosis not present

## 2018-03-04 DIAGNOSIS — I10 Essential (primary) hypertension: Secondary | ICD-10-CM | POA: Diagnosis not present

## 2018-03-04 DIAGNOSIS — R5383 Other fatigue: Secondary | ICD-10-CM | POA: Diagnosis not present

## 2018-03-04 DIAGNOSIS — Z1339 Encounter for screening examination for other mental health and behavioral disorders: Secondary | ICD-10-CM | POA: Diagnosis not present

## 2018-03-04 DIAGNOSIS — Z1331 Encounter for screening for depression: Secondary | ICD-10-CM | POA: Diagnosis not present

## 2018-03-04 DIAGNOSIS — E7800 Pure hypercholesterolemia, unspecified: Secondary | ICD-10-CM | POA: Diagnosis not present

## 2018-03-04 DIAGNOSIS — F419 Anxiety disorder, unspecified: Secondary | ICD-10-CM | POA: Diagnosis not present

## 2018-03-04 DIAGNOSIS — Z299 Encounter for prophylactic measures, unspecified: Secondary | ICD-10-CM | POA: Diagnosis not present

## 2018-03-04 DIAGNOSIS — Z6825 Body mass index (BMI) 25.0-25.9, adult: Secondary | ICD-10-CM | POA: Diagnosis not present

## 2018-03-04 DIAGNOSIS — Z0000 Encounter for general adult medical examination without abnormal findings: Secondary | ICD-10-CM | POA: Diagnosis not present

## 2018-03-04 DIAGNOSIS — Z79899 Other long term (current) drug therapy: Secondary | ICD-10-CM | POA: Diagnosis not present

## 2018-03-04 DIAGNOSIS — Z1211 Encounter for screening for malignant neoplasm of colon: Secondary | ICD-10-CM | POA: Diagnosis not present

## 2018-03-04 DIAGNOSIS — Z7189 Other specified counseling: Secondary | ICD-10-CM | POA: Diagnosis not present

## 2018-03-20 ENCOUNTER — Telehealth: Payer: Self-pay | Admitting: Cardiovascular Disease

## 2018-03-20 NOTE — Telephone Encounter (Signed)
Received telephone call from [place] in regards to a form that was faxed to the [place] office on 03-19-2018.  Office # [phone].

## 2018-03-21 NOTE — Telephone Encounter (Signed)
Faxed to [place] office [phone]

## 2018-03-21 NOTE — Telephone Encounter (Signed)
Xarelto can be held for 2 days prior to procedure.

## 2018-03-26 DIAGNOSIS — M4004 Postural kyphosis, thoracic region: Secondary | ICD-10-CM | POA: Diagnosis not present

## 2018-03-26 DIAGNOSIS — M9903 Segmental and somatic dysfunction of lumbar region: Secondary | ICD-10-CM | POA: Diagnosis not present

## 2018-03-26 DIAGNOSIS — G441 Vascular headache, not elsewhere classified: Secondary | ICD-10-CM | POA: Diagnosis not present

## 2018-03-26 DIAGNOSIS — M47812 Spondylosis without myelopathy or radiculopathy, cervical region: Secondary | ICD-10-CM | POA: Diagnosis not present

## 2018-03-26 DIAGNOSIS — F419 Anxiety disorder, unspecified: Secondary | ICD-10-CM | POA: Diagnosis not present

## 2018-03-26 DIAGNOSIS — I10 Essential (primary) hypertension: Secondary | ICD-10-CM | POA: Diagnosis not present

## 2018-03-26 DIAGNOSIS — M9902 Segmental and somatic dysfunction of thoracic region: Secondary | ICD-10-CM | POA: Diagnosis not present

## 2018-03-26 DIAGNOSIS — S39012A Strain of muscle, fascia and tendon of lower back, initial encounter: Secondary | ICD-10-CM | POA: Diagnosis not present

## 2018-03-26 DIAGNOSIS — Z299 Encounter for prophylactic measures, unspecified: Secondary | ICD-10-CM | POA: Diagnosis not present

## 2018-03-26 DIAGNOSIS — Z713 Dietary counseling and surveillance: Secondary | ICD-10-CM | POA: Diagnosis not present

## 2018-03-26 DIAGNOSIS — M9901 Segmental and somatic dysfunction of cervical region: Secondary | ICD-10-CM | POA: Diagnosis not present

## 2018-03-26 DIAGNOSIS — Z6826 Body mass index (BMI) 26.0-26.9, adult: Secondary | ICD-10-CM | POA: Diagnosis not present

## 2018-04-01 DIAGNOSIS — E1140 Type 2 diabetes mellitus with diabetic neuropathy, unspecified: Secondary | ICD-10-CM | POA: Diagnosis not present

## 2018-04-01 DIAGNOSIS — E1151 Type 2 diabetes mellitus with diabetic peripheral angiopathy without gangrene: Secondary | ICD-10-CM | POA: Diagnosis not present

## 2018-04-15 DIAGNOSIS — I10 Essential (primary) hypertension: Secondary | ICD-10-CM | POA: Diagnosis not present

## 2018-04-15 DIAGNOSIS — E1140 Type 2 diabetes mellitus with diabetic neuropathy, unspecified: Secondary | ICD-10-CM | POA: Diagnosis not present

## 2018-04-15 DIAGNOSIS — I4891 Unspecified atrial fibrillation: Secondary | ICD-10-CM | POA: Diagnosis not present

## 2018-04-15 DIAGNOSIS — Z6826 Body mass index (BMI) 26.0-26.9, adult: Secondary | ICD-10-CM | POA: Diagnosis not present

## 2018-04-15 DIAGNOSIS — E1165 Type 2 diabetes mellitus with hyperglycemia: Secondary | ICD-10-CM | POA: Diagnosis not present

## 2018-04-15 DIAGNOSIS — Z299 Encounter for prophylactic measures, unspecified: Secondary | ICD-10-CM | POA: Diagnosis not present

## 2018-04-17 DIAGNOSIS — E2839 Other primary ovarian failure: Secondary | ICD-10-CM | POA: Diagnosis not present

## 2018-04-17 DIAGNOSIS — Z79899 Other long term (current) drug therapy: Secondary | ICD-10-CM | POA: Diagnosis not present

## 2018-04-17 DIAGNOSIS — M810 Age-related osteoporosis without current pathological fracture: Secondary | ICD-10-CM | POA: Diagnosis not present

## 2018-04-22 DIAGNOSIS — M79672 Pain in left foot: Secondary | ICD-10-CM | POA: Diagnosis not present

## 2018-04-22 DIAGNOSIS — M79671 Pain in right foot: Secondary | ICD-10-CM | POA: Diagnosis not present

## 2018-04-22 DIAGNOSIS — M25579 Pain in unspecified ankle and joints of unspecified foot: Secondary | ICD-10-CM | POA: Diagnosis not present

## 2018-05-19 DIAGNOSIS — G441 Vascular headache, not elsewhere classified: Secondary | ICD-10-CM | POA: Diagnosis not present

## 2018-05-19 DIAGNOSIS — M9902 Segmental and somatic dysfunction of thoracic region: Secondary | ICD-10-CM | POA: Diagnosis not present

## 2018-05-19 DIAGNOSIS — M9903 Segmental and somatic dysfunction of lumbar region: Secondary | ICD-10-CM | POA: Diagnosis not present

## 2018-05-19 DIAGNOSIS — M4004 Postural kyphosis, thoracic region: Secondary | ICD-10-CM | POA: Diagnosis not present

## 2018-05-19 DIAGNOSIS — M47812 Spondylosis without myelopathy or radiculopathy, cervical region: Secondary | ICD-10-CM | POA: Diagnosis not present

## 2018-05-19 DIAGNOSIS — M9901 Segmental and somatic dysfunction of cervical region: Secondary | ICD-10-CM | POA: Diagnosis not present

## 2018-05-19 DIAGNOSIS — S39012A Strain of muscle, fascia and tendon of lower back, initial encounter: Secondary | ICD-10-CM | POA: Diagnosis not present

## 2018-05-20 DIAGNOSIS — M79672 Pain in left foot: Secondary | ICD-10-CM | POA: Diagnosis not present

## 2018-05-20 DIAGNOSIS — M25579 Pain in unspecified ankle and joints of unspecified foot: Secondary | ICD-10-CM | POA: Diagnosis not present

## 2018-05-20 DIAGNOSIS — M79671 Pain in right foot: Secondary | ICD-10-CM | POA: Diagnosis not present

## 2018-05-28 DIAGNOSIS — E1165 Type 2 diabetes mellitus with hyperglycemia: Secondary | ICD-10-CM | POA: Diagnosis not present

## 2018-05-28 DIAGNOSIS — I10 Essential (primary) hypertension: Secondary | ICD-10-CM | POA: Diagnosis not present

## 2018-05-28 DIAGNOSIS — Z299 Encounter for prophylactic measures, unspecified: Secondary | ICD-10-CM | POA: Diagnosis not present

## 2018-05-28 DIAGNOSIS — Z6825 Body mass index (BMI) 25.0-25.9, adult: Secondary | ICD-10-CM | POA: Diagnosis not present

## 2018-05-28 DIAGNOSIS — F419 Anxiety disorder, unspecified: Secondary | ICD-10-CM | POA: Diagnosis not present

## 2018-05-28 DIAGNOSIS — I4891 Unspecified atrial fibrillation: Secondary | ICD-10-CM | POA: Diagnosis not present

## 2018-06-02 DIAGNOSIS — M79671 Pain in right foot: Secondary | ICD-10-CM | POA: Diagnosis not present

## 2018-06-02 DIAGNOSIS — M25579 Pain in unspecified ankle and joints of unspecified foot: Secondary | ICD-10-CM | POA: Diagnosis not present

## 2018-06-02 DIAGNOSIS — M79672 Pain in left foot: Secondary | ICD-10-CM | POA: Diagnosis not present

## 2018-06-16 DIAGNOSIS — Z96659 Presence of unspecified artificial knee joint: Secondary | ICD-10-CM | POA: Diagnosis not present

## 2018-06-16 DIAGNOSIS — Z96651 Presence of right artificial knee joint: Secondary | ICD-10-CM | POA: Diagnosis not present

## 2018-06-16 DIAGNOSIS — Z471 Aftercare following joint replacement surgery: Secondary | ICD-10-CM | POA: Diagnosis not present

## 2018-07-28 ENCOUNTER — Ambulatory Visit: Payer: Medicare Other | Admitting: Cardiovascular Disease

## 2018-07-28 ENCOUNTER — Encounter: Payer: Self-pay | Admitting: Cardiovascular Disease

## 2018-07-28 ENCOUNTER — Encounter: Payer: Self-pay | Admitting: *Deleted

## 2018-07-28 VITALS — BP 128/79 | HR 66 | Ht 63.0 in | Wt 139.2 lb

## 2018-07-28 DIAGNOSIS — I480 Paroxysmal atrial fibrillation: Secondary | ICD-10-CM

## 2018-07-28 NOTE — Progress Notes (Signed)
SUBJECTIVE: The patient presents for follow-up of paroxysmal atrial fibrillation.  Echocardiogram 05/18/16: Normal left ventricular systolic function, LVEF 95-63%, grade 1 diastolic dysfunction. The left atrium was normal in size.  She was recently hospitalized for a bladder infection at [facility].  I will have to request records for personal review.  She said she had nausea, vomiting, and diarrhea.  She said she was in the ED for about 6 hours.  She had some chest and abdominal soreness and some left hand numbness.  She told me she takes Xarelto about once or twice per week because it makes her feel very cold.   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 Son in [place], daughter in [place].  Review of Systems: As per "subjective", otherwise negative.  Allergies  Allergen Reactions  . Levofloxacin Anaphylaxis and Rash  . Cardizem [Diltiazem] Other (See Comments)    Patient states that she could not think, felt that she was in the twilight zone. Arm discomfort.  . Sulfa Antibiotics Itching  . Diazepam Nausea Only    Current Outpatient Medications  Medication Sig Dispense Refill  . acyclovir (ZOVIRAX) 400 MG tablet Take 400 mg by mouth daily.    Marland Kitchen ALPRAZolam (XANAX) 0.5 MG tablet Take 0.25-0.5 mg by mouth at bedtime as needed (for sleep.).    Marland Kitchen bisacodyl (DULCOLAX) 5 MG EC tablet Take 1 tablet (5 mg total) by mouth daily as needed for moderate constipation. 30 tablet 0  . cetirizine (ZYRTEC) 10 MG tablet Take 10 mg by mouth daily.     Marland Kitchen glimepiride (AMARYL) 2 MG tablet Take 2 mg by mouth daily with breakfast.     . HYDROmorphone (DILAUDID) 2 MG tablet Take 1 tablet (2 mg total) by mouth every 4 (four) hours as needed for moderate pain. 60 tablet 0  . metFORMIN (GLUCOPHAGE-XR) 500 MG 24 hr tablet Take 500 mg by mouth daily with breakfast.     . metoprolol (LOPRESSOR) 50 MG tablet Take 50 mg by mouth daily after breakfast.     . oxybutynin  (DITROPAN-XL) 10 MG 24 hr tablet Take 10 mg by mouth daily.    . pantoprazole (PROTONIX) 40 MG tablet Take 1 tablet (40 mg total) by mouth at bedtime. 30 tablet 5  . polyethylene glycol (MIRALAX / GLYCOLAX) packet Take 17 g by mouth daily as needed for mild constipation. 14 each 0  . XARELTO 20 MG TABS tablet TAKE 1 TABLET BY MOUTH DAILY WITH SUPPER. 30 tablet 6  . rosuvastatin (CRESTOR) 10 MG tablet Take 1 tablet by mouth daily.     No current facility-administered medications for this visit.     Past Medical History:  Diagnosis Date  . Arthritis   . Concussion    2015  . Depression   . Diabetes ([place])    type 2  . Dysphagia   . Dysrhythmia    afib  . GERD (gastroesophageal reflux disease)   . Headache   . History of bronchitis   . Seasonal allergies   . Toenail fungus     Past Surgical History:  Procedure Laterality Date  . APPENDECTOMY    . BACK SURGERY     x2  . BOTOX INJECTION N/A 06/06/2016   Procedure: BOTOX INJECTION;  Surgeon: Rogene Houston, MD;  [facility];  Service: Endoscopy;  Laterality: N/A;  . CHOLECYSTECTOMY    . COLONOSCOPY N/A 11/05/2012   Procedure: COLONOSCOPY;  Surgeon:  Rogene Houston, MD;  [facility];  Service: Endoscopy;  Laterality: N/A;  1030  . ESOPHAGEAL DILATION N/A 07/06/2015   Procedure: ESOPHAGEAL DILATION;  Surgeon: Rogene Houston, MD;  [facility];  Service: Endoscopy;  Laterality: N/A;  . ESOPHAGEAL DILATION N/A 06/06/2016   Procedure: ESOPHAGEAL DILATION;  Surgeon: Rogene Houston, MD;  [facility];  Service: Endoscopy;  Laterality: N/A;  . ESOPHAGOGASTRODUODENOSCOPY N/A 02/12/2014   Procedure: ESOPHAGOGASTRODUODENOSCOPY (EGD);  Surgeon: Rogene Houston, MD;  [facility];  Service: Endoscopy;  Laterality: N/A;  150  . ESOPHAGOGASTRODUODENOSCOPY N/A 07/06/2015   Procedure: ESOPHAGOGASTRODUODENOSCOPY (EGD);  Surgeon: Rogene Houston, MD;  [facility];  Service:  Endoscopy;  Laterality: N/A;  1:25 - moved to 1/18 @ 10:30 - Ann to notify pt  . ESOPHAGOGASTRODUODENOSCOPY (EGD) WITH ESOPHAGEAL DILATION N/A 07/25/2012   Procedure: ESOPHAGOGASTRODUODENOSCOPY (EGD) WITH ESOPHAGEAL DILATION;  Surgeon: Rogene Houston, MD;  [facility];  Service: Endoscopy;  Laterality: N/A;  325-rescheduled to [place] notified pt  . ESOPHAGOGASTRODUODENOSCOPY (EGD) WITH PROPOFOL N/A 06/06/2016   Procedure: ESOPHAGOGASTRODUODENOSCOPY (EGD) WITH PROPOFOL;  Surgeon: Rogene Houston, MD;  [facility];  Service: Endoscopy;  Laterality: N/A;  . EYE SURGERY     cataracts removed  . Foot surgeries Bilateral    hammer toes  . MALONEY DILATION N/A 02/12/2014   Procedure: Venia Minks DILATION;  Surgeon: Rogene Houston, MD;  [facility];  Service: Endoscopy;  Laterality: N/A;  . Right knee arthroscopy     x2  . TONSILLECTOMY    . TOTAL ABDOMINAL HYSTERECTOMY    . TOTAL KNEE ARTHROPLASTY Right 04/03/2017   Procedure: RIGHT TOTAL KNEE ARTHROPLASTY;  Surgeon: Latanya Maudlin, MD;  [facility];  Service: Orthopedics;  Laterality: Right;    Social History   Socioeconomic History  . Marital status: Divorced    Spouse name: Not on file  . Number of children: Not on file  . Years of education: Not on file  . Highest education level: Not on file  Occupational History  . Not on file  Social Needs  . Financial resource strain: Not on file  . Food insecurity:    Worry: Not on file    Inability: Not on file  . Transportation needs:    Medical: Not on file    Non-medical: Not on file  Tobacco Use  . Smoking status: Never Smoker  . Smokeless tobacco: Never Used  Substance and Sexual Activity  . Alcohol use: No    Alcohol/week: 0.0 standard drinks    Comment: socially   . Drug use: No  . Sexual activity: Not Currently    Partners: Male    Birth control/protection: None  Lifestyle  . Physical activity:    Days per week: Not on file    Minutes  per session: Not on file  . Stress: Not on file  Relationships  . Social connections:    Talks on phone: Not on file    Gets together: Not on file    Attends religious service: Not on file    Active member of club or organization: Not on file    Attends meetings of clubs or organizations: Not on file    Relationship status: Not on file  . Intimate partner violence:    Fear of current or ex partner: Not on file    Emotionally abused: Not on file    Physically  abused: Not on file    Forced sexual activity: Not on file  Other Topics Concern  . Not on file  Social History Narrative  . Not on file     Vitals:   07/28/18 0857  BP: 128/79  Pulse: 66  Weight: 139 lb 3.2 oz (63.1 kg)  Height: 5\' 3"  (1.6 m)    Wt Readings from Last 3 Encounters:  07/28/18 139 lb 3.2 oz (63.1 kg)  07/15/17 131 lb (59.4 kg)  04/03/17 141 lb (64 kg)     PHYSICAL EXAM General: NAD HEENT: Normal. Neck: No JVD, no thyromegaly. Lungs: Clear to auscultation bilaterally with normal respiratory effort. CV: Regular rate and rhythm, normal S1/S2, no S3/S4, no murmur. No pretibial or periankle edema.  No carotid bruit.   Abdomen: Soft, nontender, no distention.  Neurologic: Alert and oriented.  Psych: Normal affect. Skin: Normal. Musculoskeletal: No gross deformities.    ECG: Reviewed above under Subjective   Labs: Lab Results  Component Value Date/Time   K 3.6 04/05/2017 06:04 AM   BUN 8 04/05/2017 06:04 AM   CREATININE 0.63 04/05/2017 06:04 AM   ALT 22 03/25/2017 11:46 AM   TSH 1.118 05/18/2016 07:23 PM   HGB 12.3 04/06/2017 06:17 AM     Lipids: No results found for: LDLCALC, LDLDIRECT, CHOL, TRIG, HDL     ASSESSMENT AND PLAN:  1. Paroxysmal atrial fibrillation:Symptomatically stable on metoprolol. CHADSVASC score is 22 (age, gender, diabetes), thus thromboembolic risk is elevated.  We again had a long talk about the importance of systemic anticoagulation for thromboembolic risk  reduction.  Continue Xarelto 20 mg daily.    Disposition: Follow up 1 year   Kate Sable, M.D., F.A.C.C.

## 2018-07-28 NOTE — Patient Instructions (Signed)
Medication Instructions:  Continue all current medications.  Labwork:   Testing/Procedures:   Follow-Up: Your physician wants you to follow up in:  1 year.  You will receive a reminder letter in the mail one-two months in advance.  If you don't receive a letter, please call our office to schedule the follow up appointment   Any Other Special Instructions Will Be Listed Below (If Applicable).  If you need a refill on your cardiac medications before your next appointment, please call your pharmacy.

## 2018-08-07 ENCOUNTER — Ambulatory Visit (INDEPENDENT_AMBULATORY_CARE_PROVIDER_SITE_OTHER): Payer: Medicare Other | Admitting: Internal Medicine

## 2018-08-07 ENCOUNTER — Encounter (INDEPENDENT_AMBULATORY_CARE_PROVIDER_SITE_OTHER): Payer: Self-pay | Admitting: Internal Medicine

## 2018-08-07 VITALS — BP 110/73 | HR 80 | Temp 98.0°F | Ht 63.0 in | Wt 140.0 lb

## 2018-08-07 DIAGNOSIS — R197 Diarrhea, unspecified: Secondary | ICD-10-CM | POA: Diagnosis not present

## 2018-08-07 NOTE — Progress Notes (Signed)
Subjective:    Patient ID: Katrina Castro, female    DOB: 06/26/1945, 72 y.o.   MRN: [id number]  HPI Seen in the ED at [facility] 07/22/2018 with a UTI. She however did not have symptoms of a UTI. She had nausea, vomiting and diarrhea. She was given 2 antibiotics while in the ED (Omnicef and Flagyl to go home with). She tells me she is still having some diarrhea. She is having 2-3 stools a day. She says the diarrhea was so bad she had to get her some Depends to wear.  Sometimes her stools are gooky and sticky. She tells me she had C-diff back in 2018.  Her last colonoscopy was in 2014 which was normal.  She says her appetite is okay. Last weight in 2018 was 142. Today her weight is 140.    Review of Systems Past Medical History:  Diagnosis Date  . Arthritis   . Concussion    2015  . Depression   . Diabetes ([place])    type 2  . Dysphagia   . Dysrhythmia    afib  . GERD (gastroesophageal reflux disease)   . Headache   . History of bronchitis   . Seasonal allergies   . Toenail fungus     Past Surgical History:  Procedure Laterality Date  . APPENDECTOMY    . BACK SURGERY     x2  . BOTOX INJECTION N/A 06/06/2016   Procedure: BOTOX INJECTION;  Surgeon: Rogene Houston, MD;  [facility];  Service: Endoscopy;  Laterality: N/A;  . CHOLECYSTECTOMY    . COLONOSCOPY N/A 11/05/2012   Procedure: COLONOSCOPY;  Surgeon: Rogene Houston, MD;  [facility];  Service: Endoscopy;  Laterality: N/A;  1030  . ESOPHAGEAL DILATION N/A 07/06/2015   Procedure: ESOPHAGEAL DILATION;  Surgeon: Rogene Houston, MD;  [facility];  Service: Endoscopy;  Laterality: N/A;  . ESOPHAGEAL DILATION N/A 06/06/2016   Procedure: ESOPHAGEAL DILATION;  Surgeon: Rogene Houston, MD;  [facility];  Service: Endoscopy;  Laterality: N/A;  . ESOPHAGOGASTRODUODENOSCOPY N/A 02/12/2014   Procedure: ESOPHAGOGASTRODUODENOSCOPY (EGD);  Surgeon: Rogene Houston, MD;  [facility];  Service: Endoscopy;  Laterality: N/A;  150  . ESOPHAGOGASTRODUODENOSCOPY N/A 07/06/2015   Procedure: ESOPHAGOGASTRODUODENOSCOPY (EGD);  Surgeon: Rogene Houston, MD;  [facility];  Service: Endoscopy;  Laterality: N/A;  1:25 - moved to 1/18 @ 10:30 - Ann to notify pt  . ESOPHAGOGASTRODUODENOSCOPY (EGD) WITH ESOPHAGEAL DILATION N/A 07/25/2012   Procedure: ESOPHAGOGASTRODUODENOSCOPY (EGD) WITH ESOPHAGEAL DILATION;  Surgeon: Rogene Houston, MD;  [facility];  Service: Endoscopy;  Laterality: N/A;  325-rescheduled to [place] notified pt  . ESOPHAGOGASTRODUODENOSCOPY (EGD) WITH PROPOFOL N/A 06/06/2016   Procedure: ESOPHAGOGASTRODUODENOSCOPY (EGD) WITH PROPOFOL;  Surgeon: Rogene Houston, MD;  [facility];  Service: Endoscopy;  Laterality: N/A;  . EYE SURGERY     cataracts removed  . Foot surgeries Bilateral    hammer toes  . MALONEY DILATION N/A 02/12/2014   Procedure: Venia Minks DILATION;  Surgeon: Rogene Houston, MD;  [facility];  Service: Endoscopy;  Laterality: N/A;  . Right knee arthroscopy     x2  . TONSILLECTOMY    . TOTAL ABDOMINAL HYSTERECTOMY    . TOTAL KNEE ARTHROPLASTY Right 04/03/2017   Procedure: RIGHT TOTAL KNEE ARTHROPLASTY;  Surgeon: Latanya Maudlin, MD;  [facility];  Service: Orthopedics;  Laterality: Right;  Allergies  Allergen Reactions  . Levofloxacin Anaphylaxis and Rash  . Cardizem [Diltiazem] Other (See Comments)    Patient states that she could not think, felt that she was in the twilight zone. Arm discomfort.  . Sulfa Antibiotics Itching  . Diazepam Nausea Only    Current Outpatient Medications on File Prior to Visit  Medication Sig Dispense Refill  . acyclovir (ZOVIRAX) 400 MG tablet Take 400 mg by mouth daily.    . Lactobacillus (CULTURELLE DIGESTIVE WOMENS PO) Take by mouth.    . lactobacillus acidophilus (BACID) TABS tablet Take 2 tablets by mouth 3 (three) times daily.    . metFORMIN  (GLUCOPHAGE-XR) 500 MG 24 hr tablet Take 500 mg by mouth daily with breakfast.     . oxybutynin (DITROPAN-XL) 10 MG 24 hr tablet Take 10 mg by mouth daily.    . pantoprazole (PROTONIX) 40 MG tablet Take 1 tablet (40 mg total) by mouth at bedtime. 30 tablet 5  . rosuvastatin (CRESTOR) 10 MG tablet Take 1 tablet by mouth daily.    Alveda Reasons 20 MG TABS tablet TAKE 1 TABLET BY MOUTH DAILY WITH SUPPER. 30 tablet 6  . metoprolol (LOPRESSOR) 50 MG tablet Take 50 mg by mouth daily after breakfast.      No current facility-administered medications on file prior to visit.         Objective:   Physical Exam Blood pressure 110/73, pulse 80, temperature 98 F (36.7 C), height 5\' 3"  (1.6 m), weight 140 lb (63.5 kg). Alert and oriented. Skin warm and dry. Oral mucosa is moist.   . Sclera anicteric, conjunctivae is pink. Thyroid not enlarged. No cervical lymphadenopathy. Lungs clear. Heart regular rate and rhythm.  Abdomen is soft. Bowel sounds are positive. No hepatomegaly. No abdominal masses felt. No tenderness.  No edema to lower extremities.           Assessment & Plan:  Diarrhea. Am going to get a GI pathogen. Further recommendations to follow.

## 2018-08-07 NOTE — Patient Instructions (Addendum)
GI pathogen.  Drinks plenty of fluids. Imodium as need.

## 2018-08-10 ENCOUNTER — Telehealth: Payer: Self-pay | Admitting: Gastroenterology

## 2018-08-10 NOTE — Telephone Encounter (Signed)
GI pathogen panel is still pending. Will check again tomorrow.

## 2018-08-10 NOTE — Telephone Encounter (Signed)
PT CALLED HAVING 6-7 BMs. Waking up with abdominal cramps & BMs. Taking Imodium prn. Dropped off stool study on Fri.   Recommendations: 1. Will check for stool results & call if positive. 2.Tylenol XS 2 po q8h 3.Imodium qhs & prn 4. Avoid dairy. 5. Will notify NUR in am.

## 2018-08-11 ENCOUNTER — Telehealth (INDEPENDENT_AMBULATORY_CARE_PROVIDER_SITE_OTHER): Payer: Self-pay | Admitting: Internal Medicine

## 2018-08-11 NOTE — Telephone Encounter (Signed)
Results of culture are not back yet. Patient states that her symptoms are bad.  Per Dr.Rheman may call in the Flagyl 500 mg - take 1 by mouth 3 times daily for 10 days. This was called to [facility]. Patient was called and made aware. We will call her with the results of the test.

## 2018-08-11 NOTE — Telephone Encounter (Signed)
I will call back when I get the results back.

## 2018-08-11 NOTE — Telephone Encounter (Signed)
Patient called regarding test results  -  Ph# [phone]

## 2018-08-15 LAB — GASTROINTESTINAL PATHOGEN PANEL PCR
C. difficile Tox A/B, PCR: DETECTED — AB
Campylobacter, PCR: NOT DETECTED
Cryptosporidium, PCR: NOT DETECTED
E coli (ETEC) LT/ST PCR: NOT DETECTED
E coli (STEC) stx1/stx2, PCR: NOT DETECTED
E coli 0157, PCR: NOT DETECTED
Giardia lamblia, PCR: NOT DETECTED
Norovirus, PCR: NOT DETECTED
Rotavirus A, PCR: NOT DETECTED
Salmonella, PCR: UNDETERMINED — AB
Shigella, PCR: NOT DETECTED

## 2018-10-27 ENCOUNTER — Telehealth: Payer: Self-pay | Admitting: Cardiovascular Disease

## 2018-10-27 NOTE — Telephone Encounter (Signed)
Pt says Dr Gladstone Lighter started her on meloxicam 15mg  for knee pain and inflammation from knee replacement and was told she couldn't take Xarelto with this - wanted to know Dr Raylene Everts recs

## 2018-10-27 NOTE — Telephone Encounter (Signed)
The last time she was in the office she told me she takes Xarelto about once or twice per week because it makes her feel very cold. How long will she be on meloxicam for?

## 2018-10-27 NOTE — Telephone Encounter (Signed)
That would be up to her. If she has a recurrence of atrial fibrillation, she would be at increased thromboembolic risk.

## 2018-10-27 NOTE — Telephone Encounter (Signed)
Pt says she still only take Xarelto only 2-3 times weekly and per Dr Gladstone Lighter (care everywhere) her gave her meloxicam 15 mg after pt told him she was no longer taking Xarelto and would re evaluate in 6 weeks. Spoke with pt about this again and she confirmed she is only taking Xarelto every 2-3 days and wanted to know if she could stop altogether and try Meloxicam.

## 2018-10-27 NOTE — Telephone Encounter (Signed)
Please give pt a call-- she's started a new medication and can't take her blood thinner

## 2018-10-28 NOTE — Telephone Encounter (Signed)
Patient notified & verbalized understanding.  Suggested she may try Tylenol Arthritis as an alternative or contact that provider back to see if he has any other suggestions for her.    She also request Xarelto samples.  Please call if have available.

## 2018-10-28 NOTE — Telephone Encounter (Signed)
See sign out sheet, xarelto 29 mg , 4 bottles to pt, pt aware

## 2018-11-18 ENCOUNTER — Ambulatory Visit (INDEPENDENT_AMBULATORY_CARE_PROVIDER_SITE_OTHER): Payer: Medicare Other | Admitting: Internal Medicine

## 2018-11-24 ENCOUNTER — Ambulatory Visit (INDEPENDENT_AMBULATORY_CARE_PROVIDER_SITE_OTHER): Payer: Medicare Other | Admitting: Internal Medicine

## 2018-11-25 ENCOUNTER — Encounter (INDEPENDENT_AMBULATORY_CARE_PROVIDER_SITE_OTHER): Payer: Self-pay | Admitting: Internal Medicine

## 2018-11-25 ENCOUNTER — Encounter (INDEPENDENT_AMBULATORY_CARE_PROVIDER_SITE_OTHER): Payer: Self-pay | Admitting: *Deleted

## 2018-11-25 ENCOUNTER — Ambulatory Visit (INDEPENDENT_AMBULATORY_CARE_PROVIDER_SITE_OTHER): Payer: Medicare Other | Admitting: Internal Medicine

## 2018-11-25 ENCOUNTER — Other Ambulatory Visit: Payer: Self-pay

## 2018-11-25 VITALS — BP 126/77 | HR 77 | Temp 98.4°F | Ht 63.0 in | Wt 143.0 lb

## 2018-11-25 DIAGNOSIS — R1319 Other dysphagia: Secondary | ICD-10-CM

## 2018-11-25 DIAGNOSIS — R1310 Dysphagia, unspecified: Secondary | ICD-10-CM | POA: Diagnosis not present

## 2018-11-25 NOTE — Progress Notes (Signed)
Subjective:    Patient ID: Katrina Castro, female    DOB: [birth year], 73 y.o.   MRN: [id number]  HPI Presents today with c/o dysphagia.  She says she sometimes strangles just drinking water. Symptoms x 6-8 months. Hx of same. Trouble with breads.  Last EGD/ED I 2017. Revealed Normal esphagus. A single non-bleeding erosion at the GE junctdin. Low grade narrowing of Schatzki ring. Dilated. Abnormal esophageal motility/spastic distal setment. Injected with botulinum toxin, 25 units x 4. 3 cm hiatal hernia. Normal stomach. Normal duodenal bulb and second portion of the duodenum.  Appetite is good. No weight loss. BMs are normal. No melena or BRRB.   Hx of atrial fib and maintained on Xarelto  Review of Systems Past Medical History:  Diagnosis Date  . Arthritis   . Concussion    2015  . Depression   . Diabetes ([place])    type 2  . Dysphagia   . Dysrhythmia    afib  . GERD (gastroesophageal reflux disease)   . Headache   . History of bronchitis   . Seasonal allergies   . Toenail fungus     Past Surgical History:  Procedure Laterality Date  . APPENDECTOMY    . BACK SURGERY     x2  . BOTOX INJECTION N/A 06/06/2016   Procedure: BOTOX INJECTION;  Surgeon: Rogene Houston, MD;  [facility];  Service: Endoscopy;  Laterality: N/A;  . CHOLECYSTECTOMY    . COLONOSCOPY N/A 11/05/2012   Procedure: COLONOSCOPY;  Surgeon: Rogene Houston, MD;  [facility];  Service: Endoscopy;  Laterality: N/A;  1030  . ESOPHAGEAL DILATION N/A 07/06/2015   Procedure: ESOPHAGEAL DILATION;  Surgeon: Rogene Houston, MD;  [facility];  Service: Endoscopy;  Laterality: N/A;  . ESOPHAGEAL DILATION N/A 06/06/2016   Procedure: ESOPHAGEAL DILATION;  Surgeon: Rogene Houston, MD;  [facility];  Service: Endoscopy;  Laterality: N/A;  . ESOPHAGOGASTRODUODENOSCOPY N/A 02/12/2014   Procedure: ESOPHAGOGASTRODUODENOSCOPY (EGD);  Surgeon: Rogene Houston, MD;  [facility];  Service: Endoscopy;  Laterality: N/A;  150  . ESOPHAGOGASTRODUODENOSCOPY N/A 07/06/2015   Procedure: ESOPHAGOGASTRODUODENOSCOPY (EGD);  Surgeon: Rogene Houston, MD;  [facility];  Service: Endoscopy;  Laterality: N/A;  1:25 - moved to 1/18 @ 10:30 - Ann to notify pt  . ESOPHAGOGASTRODUODENOSCOPY (EGD) WITH ESOPHAGEAL DILATION N/A 07/25/2012   Procedure: ESOPHAGOGASTRODUODENOSCOPY (EGD) WITH ESOPHAGEAL DILATION;  Surgeon: Rogene Houston, MD;  [facility];  Service: Endoscopy;  Laterality: N/A;  325-rescheduled to [place] notified pt  . ESOPHAGOGASTRODUODENOSCOPY (EGD) WITH PROPOFOL N/A 06/06/2016   Procedure: ESOPHAGOGASTRODUODENOSCOPY (EGD) WITH PROPOFOL;  Surgeon: Rogene Houston, MD;  [facility];  Service: Endoscopy;  Laterality: N/A;  . EYE SURGERY     cataracts removed  . Foot surgeries Bilateral    hammer toes  . MALONEY DILATION N/A 02/12/2014   Procedure: Venia Minks DILATION;  Surgeon: Rogene Houston, MD;  [facility];  Service: Endoscopy;  Laterality: N/A;  . Right knee arthroscopy     x2  . TONSILLECTOMY    . TOTAL ABDOMINAL HYSTERECTOMY    . TOTAL KNEE ARTHROPLASTY Right 04/03/2017   Procedure: RIGHT TOTAL KNEE ARTHROPLASTY;  Surgeon: Latanya Maudlin, MD;  Location: [facility];  Service: Orthopedics;  Laterality: Right;    Allergies  Allergen Reactions  . Levofloxacin Anaphylaxis and Rash  . Cardizem [Diltiazem] Other (See Comments)  Patient states that she could not think, felt that she was in the twilight zone. Arm discomfort.  . Sulfa Antibiotics Itching  . Diazepam Nausea Only    Current Outpatient Medications on File Prior to Visit  Medication Sig Dispense Refill  . acyclovir (ZOVIRAX) 400 MG tablet Take 400 mg by mouth daily.    . cetirizine (ZYRTEC) 10 MG tablet Take 10 mg by mouth daily.    Marland Kitchen glimepiride (AMARYL) 2 MG tablet Take 2 mg by mouth as needed.    . Lactobacillus (CULTURELLE DIGESTIVE WOMENS PO)  Take by mouth.    . metFORMIN (GLUCOPHAGE-XR) 500 MG 24 hr tablet Take 500 mg by mouth daily with breakfast.     . metoprolol (LOPRESSOR) 50 MG tablet Take 50 mg by mouth daily after breakfast.     . oxybutynin (DITROPAN-XL) 10 MG 24 hr tablet Take 10 mg by mouth daily.    . pantoprazole (PROTONIX) 40 MG tablet Take 1 tablet (40 mg total) by mouth at bedtime. 30 tablet 5  . rosuvastatin (CRESTOR) 10 MG tablet Take 1 tablet by mouth daily.    Alveda Reasons 20 MG TABS tablet TAKE 1 TABLET BY MOUTH DAILY WITH SUPPER. (Patient taking differently: Once a week or twice a week.) 30 tablet 6   No current facility-administered medications on file prior to visit.         Objective:   Physical Exam Blood pressure 126/77, pulse 77, temperature 98.4 F (36.9 C), height 5\' 3"  (1.6 m), weight 143 lb (64.9 kg). Alert and oriented. Skin warm and dry. Oral mucosa is moist.   . Sclera anicteric, conjunctivae is pink. Thyroid not enlarged. No cervical lymphadenopathy. Lungs clear. Heart regular rate and rhythm.  Abdomen is soft. Bowel sounds are positive. No hepatomegaly. No abdominal masses felt. No tenderness.  No edema to lower extremities.         Assessment & Plan:  Dysphagia. Hx of Schatzki ring. Am going to get an Esophagram first.

## 2018-11-25 NOTE — Patient Instructions (Signed)
DG esophagram.   

## 2018-12-01 ENCOUNTER — Other Ambulatory Visit (INDEPENDENT_AMBULATORY_CARE_PROVIDER_SITE_OTHER): Payer: Self-pay | Admitting: Internal Medicine

## 2018-12-01 ENCOUNTER — Other Ambulatory Visit: Payer: Self-pay

## 2018-12-01 ENCOUNTER — Ambulatory Visit (HOSPITAL_COMMUNITY)
Admission: RE | Admit: 2018-12-01 | Discharge: 2018-12-01 | Disposition: A | Discharge status: Home / Self Care | Payer: Medicare Other | Source: Ambulatory Visit | Attending: Internal Medicine | Admitting: Internal Medicine

## 2018-12-01 DIAGNOSIS — R1319 Other dysphagia: Secondary | ICD-10-CM

## 2018-12-01 DIAGNOSIS — R1310 Dysphagia, unspecified: Secondary | ICD-10-CM

## 2019-02-09 DIAGNOSIS — R5383 Other fatigue: Secondary | ICD-10-CM | POA: Diagnosis not present

## 2019-04-13 ENCOUNTER — Telehealth (INDEPENDENT_AMBULATORY_CARE_PROVIDER_SITE_OTHER): Payer: Self-pay | Admitting: Nurse Practitioner

## 2019-04-13 NOTE — Telephone Encounter (Signed)
I called pt, I have previously spoken to the patient and I have not seen her in the office.  She was last seen in office by Louis Matte, NP in June 2020 and she did not have diarrhea at that time.  She stated having diarrhea shortly after she eats and today she noticed some blood with the diarrhea.  She is not in any acute distress.  I advised the patient our office staff will call her tomorrow to facilitate scheduling appointment.  If her diarrhea worsens I also advise she contact her primary care physician who can also evaluate her.  Mitzie please call the patient to schedule an office appointment soon as possible thank you

## 2019-04-13 NOTE — Telephone Encounter (Signed)
Patient called stated she is still having diarrhea and would like to know what she should be taking - ph# [phone]

## 2019-04-15 ENCOUNTER — Ambulatory Visit (INDEPENDENT_AMBULATORY_CARE_PROVIDER_SITE_OTHER): Payer: Medicare Other | Admitting: Nurse Practitioner

## 2019-04-16 ENCOUNTER — Other Ambulatory Visit (INDEPENDENT_AMBULATORY_CARE_PROVIDER_SITE_OTHER): Payer: Self-pay | Admitting: *Deleted

## 2019-04-16 ENCOUNTER — Telehealth (INDEPENDENT_AMBULATORY_CARE_PROVIDER_SITE_OTHER): Payer: Self-pay | Admitting: *Deleted

## 2019-04-16 ENCOUNTER — Other Ambulatory Visit: Payer: Self-pay

## 2019-04-16 ENCOUNTER — Encounter (INDEPENDENT_AMBULATORY_CARE_PROVIDER_SITE_OTHER): Payer: Self-pay | Admitting: Nurse Practitioner

## 2019-04-16 ENCOUNTER — Encounter (INDEPENDENT_AMBULATORY_CARE_PROVIDER_SITE_OTHER): Payer: Self-pay | Admitting: *Deleted

## 2019-04-16 ENCOUNTER — Ambulatory Visit (INDEPENDENT_AMBULATORY_CARE_PROVIDER_SITE_OTHER): Payer: Medicare Other | Admitting: Nurse Practitioner

## 2019-04-16 VITALS — BP 134/84 | HR 80 | Temp 98.2°F | Ht 63.0 in | Wt 146.8 lb

## 2019-04-16 DIAGNOSIS — K625 Hemorrhage of anus and rectum: Secondary | ICD-10-CM | POA: Insufficient documentation

## 2019-04-16 MED ORDER — PEG 3350-KCL-NA BICARB-NACL 420 G PO SOLR: 4000.0000 mL | Freq: Once | ORAL | 0 refills | Status: AC

## 2019-04-16 NOTE — Telephone Encounter (Signed)
Patient is scheduled for colonoscopy 05/01/19 - we need her to stop Xarelto 2 days prior - please advise if ok to stop, thanks

## 2019-04-16 NOTE — Progress Notes (Signed)
Subjective:    Patient ID: Katrina Castro, female    DOB: 1945-12-21, 73 y.o.   MRN: [id number]  HPI Katrina Clausing. Castro is a 73 year old female with a past medical history of depression, diabetes mellitus type 2, paroxysmal atrial fibrillation arthritis, dysphagia and GERD.  Past cholecystectomy.  She presents today with complaints of having diarrhea and rectal bleeding. She has watery diarrhea a few times last week and she noticed red blood on the toilet tissue. She often strains to pass a BM. Monday 04/13/2019 she passed 2 brown formed stools in the morning and later in the day she passed a watery diarrhea stool with red blood on the toilet tissue. Tuesday she had 3 episodes of diarrhea without rectal bleeding.  Wednesday she passed a solid stool without blood. Today no BM or rectal bleeding.  No abdominal pain.  She takes an occasional stool softener.  Her last colonoscopy was 11/05/2012 which showed internal hemorrhoids otherwise was normal.  She has a history of paroxysmal atrial fibrillation which is well controlled on Metoprolol. She was prescribed Xarelto 20mg  QD by her cardiologist, Dr. Bronson Ing, however, she reports taking Xarelto once or twice weekly and sometimes she skips a week at a time.  She does not take the Xarelto on a regular basis because it makes her feel cold.  Advised the patient to follow-up with Dr. Bronson Ing regarding her Xarelto management as she she is at risk for thromboembolic event off anticoagulation.  She was last seen by Dr. Bronson Ing 07/15/2017 and at that time he assessed her to have  CHADSVASC score of 3.  An echo done in 2017 showed LVEF 60 to 65% and grade 1 diastolic dysfunction.  She denies having any chest pain, palpitations or shortness of breath.  She has a history of GERD.  She denies having any dysphagia, heartburn or stomach pain.  She is taking pantoprazole 40 mg once daily.  He underwent an EGD 02/09/2014 which showed a Schatzki's ring, a small hiatal hernia,  nonerosive gastritis.  The Schatzki's ring was dilated.  No known family history of upper GI or colorectal cancer.  Colonoscopy 11/05/2012: Prep excellent. Normal mucosa of colon and rectum. No evidence of colonic polyps or diverticulosis. Small hemorrhoids below the dentate line. Recall colonoscopy 10 years.  EGD 02/09/2014: Schatzki's ring and  small sliding hiatal hernia. Nonerosive gastritis(H. pylori serology negative in June 2013). Schatzki's ring was disrupted by passing 6 French balloon dilator and focal biopsy.   Past Medical History:  Diagnosis Date   Arthritis    Concussion    2015   Depression    Diabetes ([place])    type 2   Dysphagia    Dysrhythmia    afib   GERD (gastroesophageal reflux disease)    Headache    History of bronchitis    Seasonal allergies    Toenail fungus    Past Surgical History:  Procedure Laterality Date   APPENDECTOMY     BACK SURGERY     x2   BOTOX INJECTION N/A 06/06/2016   Procedure: BOTOX INJECTION;  Surgeon: Rogene Houston, MD;  [facility];  Service: Endoscopy;  Laterality: N/A;   CHOLECYSTECTOMY     COLONOSCOPY N/A 11/05/2012   Procedure: COLONOSCOPY;  Surgeon: Rogene Houston, MD;  [facility];  Service: Endoscopy;  Laterality: N/A;  1030   ESOPHAGEAL DILATION N/A 07/06/2015   Procedure: ESOPHAGEAL DILATION;  Surgeon: Rogene Houston, MD;  [facility];  Service: Endoscopy;  Laterality: N/A;   ESOPHAGEAL DILATION N/A 06/06/2016   Procedure: ESOPHAGEAL DILATION;  Surgeon: Rogene Houston, MD;  [facility];  Service: Endoscopy;  Laterality: N/A;   ESOPHAGOGASTRODUODENOSCOPY N/A 02/12/2014   Procedure: ESOPHAGOGASTRODUODENOSCOPY (EGD);  Surgeon: Rogene Houston, MD;  [facility];  Service: Endoscopy;  Laterality: N/A;  150   ESOPHAGOGASTRODUODENOSCOPY N/A 07/06/2015   Procedure: ESOPHAGOGASTRODUODENOSCOPY (EGD);  Surgeon: Rogene Houston, MD;  [facility];  Service: Endoscopy;  Laterality: N/A;  1:25 - moved to 1/18 @ 10:30 - Ann to notify pt   ESOPHAGOGASTRODUODENOSCOPY (EGD) WITH ESOPHAGEAL DILATION N/A 07/25/2012   Procedure: ESOPHAGOGASTRODUODENOSCOPY (EGD) WITH ESOPHAGEAL DILATION;  Surgeon: Rogene Houston, MD;  [facility];  Service: Endoscopy;  Laterality: N/A;  325-rescheduled to [place] notified pt   ESOPHAGOGASTRODUODENOSCOPY (EGD) WITH PROPOFOL N/A 06/06/2016   Procedure: ESOPHAGOGASTRODUODENOSCOPY (EGD) WITH PROPOFOL;  Surgeon: Rogene Houston, MD;  [facility];  Service: Endoscopy;  Laterality: N/A;   EYE SURGERY     cataracts removed   Foot surgeries Bilateral    hammer toes   MALONEY DILATION N/A 02/12/2014   Procedure: MALONEY DILATION;  Surgeon: Rogene Houston, MD;  [facility];  Service: Endoscopy;  Laterality: N/A;   Right knee arthroscopy     x2   TONSILLECTOMY     TOTAL ABDOMINAL HYSTERECTOMY     TOTAL KNEE ARTHROPLASTY Right 04/03/2017   Procedure: RIGHT TOTAL KNEE ARTHROPLASTY;  Surgeon: Latanya Maudlin, MD;  Location: [facility];  Service: Orthopedics;  Laterality: Right;   Current Outpatient Medications on File Prior to Visit  Medication Sig Dispense Refill   acyclovir (ZOVIRAX) 400 MG tablet Take 400 mg by mouth daily.     cetirizine (ZYRTEC) 10 MG tablet Take 10 mg by mouth daily.     glimepiride (AMARYL) 2 MG tablet Take 2 mg by mouth as needed.     metFORMIN (GLUCOPHAGE-XR) 500 MG 24 hr tablet Take 500 mg by mouth daily with breakfast.      metoprolol (LOPRESSOR) 50 MG tablet Take 50 mg by mouth daily after breakfast.      oxybutynin (DITROPAN-XL) 10 MG 24 hr tablet Take 10 mg by mouth daily.     pantoprazole (PROTONIX) 40 MG tablet Take 1 tablet (40 mg total) by mouth at bedtime. 30 tablet 5   XARELTO 20 MG TABS tablet TAKE 1 TABLET BY MOUTH DAILY WITH SUPPER. 30 tablet 6   rosuvastatin (CRESTOR) 10 MG tablet Take 1 tablet by mouth daily.     No  current facility-administered medications on file prior to visit.    Allergies  Allergen Reactions   Levofloxacin Anaphylaxis and Rash   Cardizem [Diltiazem] Other (See Comments)    Patient states that she could not think, felt that she was in the twilight zone. Arm discomfort.   Sulfa Antibiotics Itching   Diazepam Nausea Only   Review of Systems see  HPI, all other systems reviewed and are negative     Objective:   Physical Exam  BP 134/84    Pulse 80    Temp 98.2 F (36.8 C) (Oral)    Ht 5\' 3"  (1.6 m)    Wt 146 lb 12.8 oz (66.6 kg)    BMI 26.00 kg/m  General: 73 year old female in no acute distress Eyes: Sclera nonicteric, conjunctiva Mouth: Upper dentures, no ulcers or lesions Neck: Supple, no lymphadenopathy Heart: Regular rate and rhythm, no  murmurs Lungs: Breath sounds clear throughout Abdomen: Soft, nontender, no masses or organomegaly Rectal: Patient declined exam. Extremities: No edema Neuro: Alert and oriented x4, no focal deficits     Assessment & Plan:   62.  73 year old female with rectal bleeding, history of internal hemorrhoids.  -Colonoscopy benefits and risk discussed including the risk with sedation, bleeding, infection and perforation -Further follow-up to be determined after colonoscopy completed -Quested a copy of a recent CBC and CMP done by PCPs office -Patient to call our office if she has any further rectal bleeding and a repeat CBC will be ordered -Desitin for hemorrhoidal irritation  2.  Alternating constipation and diarrhea, unlikely infectious process -Benefiber 1 tablespoon once daily -Phillips bacteria probiotic once daily  3.  Paroxysmal atrial fibrillation prescribed Xarelto daily, however, patient takes infrequently -Advised the patient to follow-up with Dr. Bronson Ing regarding her Xarelto management as she is noncompliant

## 2019-04-16 NOTE — Patient Instructions (Signed)
1.  Schedule colonoscopy  2.  Benefiber 1 tablespoon once daily.  Phillips probiotic 1 capsule once daily.  3.  For hemorrhoidal irritation apply small amount of Desitin diaper rash cream inside the anal opening into the external anal area twice daily as needed  4.  Call our office if your diarrhea recurs or if your rectal bleeding worsens

## 2019-04-16 NOTE — Telephone Encounter (Signed)
Patient needs trilyte TCS sch'd 11/13

## 2019-04-17 NOTE — Telephone Encounter (Signed)
sure

## 2019-04-20 NOTE — Telephone Encounter (Signed)
Patient aware.

## 2019-04-28 NOTE — Patient Instructions (Signed)
Katrina Castro  04/28/2019     @PREFPERIOPPHARMACY @   Your procedure is scheduled on  05/01/2019   Report to [facility] at  [phone]  A.M.  Call this number if you have problems the morning of surgery:  [phone]   Remember:  Follow the diet and prep instructions given to you by Dr Olevia Perches office.                       Take these medicines the morning of surgery with A SIP OF WATER  Zyrtec, metoprolol, ditropan, protonix.    Do not wear jewelry, make-up or nail polish.  Do not wear lotions, powders, or perfumes. Please wear deodorant and brush your teeth,  Do not shave 48 hours prior to surgery.  Men may shave face and neck.  Do not bring valuables to the hospital.  [facility] is not responsible for any belongings or valuables.  Contacts, dentures or bridgework may not be worn into surgery.  Leave your suitcase in the car.  After surgery it may be brought to your room.  For patients admitted to the hospital, discharge time will be determined by your treatment team.  Patients discharged the day of surgery will not be allowed to drive home.   Name and phone number of your driver:   family Special instructions:  None  Please read over the following fact sheets that you were given. Anesthesia Post-op Instructions and Care and Recovery After Surgery       Colonoscopy, Adult, Care After This sheet gives you information about how to care for yourself after your procedure. Your health care provider may also give you more specific instructions. If you have problems or questions, contact your health care provider. What can I expect after the procedure? After the procedure, it is common to have:  A small amount of blood in your stool for 24 hours after the procedure.  Some gas.  Mild abdominal cramping or bloating. Follow these instructions at home: General instructions  For the first 24 hours after the procedure: ? Do not drive or use machinery. ? Do not sign  important documents. ? Do not drink alcohol. ? Do your regular daily activities at a slower pace than normal. ? Eat soft, easy-to-digest foods.  Take over-the-counter or prescription medicines only as told by your health care provider. Relieving cramping and bloating   Try walking around when you have cramps or feel bloated.  Apply heat to your abdomen as told by your health care provider. Use a heat source that your health care provider recommends, such as a moist heat pack or a heating pad. ? Place a towel between your skin and the heat source. ? Leave the heat on for 20-30 minutes. ? Remove the heat if your skin turns bright red. This is especially important if you are unable to feel pain, heat, or cold. You may have a greater risk of getting burned. Eating and drinking   Drink enough fluid to keep your urine pale yellow.  Resume your normal diet as instructed by your health care provider. Avoid heavy or fried foods that are hard to digest.  Avoid drinking alcohol for as long as instructed by your health care provider. Contact a health care provider if:  You have blood in your stool 2-3 days after the procedure. Get help right away if:  You have more than a small spotting of blood in your stool.  You pass large blood clots in your stool.  Your abdomen is swollen.  You have nausea or vomiting.  You have a fever.  You have increasing abdominal pain that is not relieved with medicine. Summary  After the procedure, it is common to have a small amount of blood in your stool. You may also have mild abdominal cramping and bloating.  For the first 24 hours after the procedure, do not drive or use machinery, sign important documents, or drink alcohol.  Contact your health care provider if you have a lot of blood in your stool, nausea or vomiting, a fever, or increased abdominal pain. This information is not intended to replace advice given to you by your health care provider.  Make sure you discuss any questions you have with your health care provider. Document Released: 01/17/2004 Document Revised: 03/27/2017 Document Reviewed: 08/16/2015 Elsevier Patient Education  2020 [place] After These instructions provide you with information about caring for yourself after your procedure. Your health care provider may also give you more specific instructions. Your treatment has been planned according to current medical practices, but problems sometimes occur. Call your health care provider if you have any problems or questions after your procedure. What can I expect after the procedure? After your procedure, you may:  Feel sleepy for several hours.  Feel clumsy and have poor balance for several hours.  Feel forgetful about what happened after the procedure.  Have poor judgment for several hours.  Feel nauseous or vomit.  Have a sore throat if you had a breathing tube during the procedure. Follow these instructions at home: For at least 24 hours after the procedure:      Have a responsible adult stay with you. It is important to have someone help care for you until you are awake and alert.  Rest as needed.  Do not: ? Participate in activities in which you could fall or become injured. ? Drive. ? Use heavy machinery. ? Drink alcohol. ? Take sleeping pills or medicines that cause drowsiness. ? Make important decisions or sign legal documents. ? Take care of children on your own. Eating and drinking  Follow the diet that is recommended by your health care provider.  If you vomit, drink water, juice, or soup when you can drink without vomiting.  Make sure you have little or no nausea before eating solid foods. General instructions  Take over-the-counter and prescription medicines only as told by your health care provider.  If you have sleep apnea, surgery and certain medicines can increase your risk for breathing  problems. Follow instructions from your health care provider about wearing your sleep device: ? Anytime you are sleeping, including during daytime naps. ? While taking prescription pain medicines, sleeping medicines, or medicines that make you drowsy.  If you smoke, do not smoke without supervision.  Keep all follow-up visits as told by your health care provider. This is important. Contact a health care provider if:  You keep feeling nauseous or you keep vomiting.  You feel light-headed.  You develop a rash.  You have a fever. Get help right away if:  You have trouble breathing. Summary  For several hours after your procedure, you may feel sleepy and have poor judgment.  Have a responsible adult stay with you for at least 24 hours or until you are awake and alert. This information is not intended to replace advice given to you by your health care provider. Make sure you  discuss any questions you have with your health care provider. Document Released: 09/25/2015 Document Revised: 09/02/2017 Document Reviewed: 09/25/2015 Elsevier Patient Education  2020 [employer].

## 2019-04-29 ENCOUNTER — Encounter (HOSPITAL_COMMUNITY)
Admission: RE | Admit: 2019-04-29 | Discharge: 2019-04-29 | Disposition: A | Discharge status: Home / Self Care | Payer: Medicare Other | Source: Ambulatory Visit | Attending: Internal Medicine | Admitting: Internal Medicine

## 2019-04-29 ENCOUNTER — Other Ambulatory Visit (HOSPITAL_COMMUNITY)
Admission: RE | Admit: 2019-04-29 | Discharge: 2019-04-29 | Disposition: A | Discharge status: Home / Self Care | Payer: Medicare Other | Source: Ambulatory Visit | Attending: Internal Medicine | Admitting: Internal Medicine

## 2019-04-29 ENCOUNTER — Encounter (HOSPITAL_COMMUNITY): Payer: Self-pay

## 2019-04-29 ENCOUNTER — Other Ambulatory Visit: Payer: Self-pay

## 2019-04-29 DIAGNOSIS — K625 Hemorrhage of anus and rectum: Secondary | ICD-10-CM

## 2019-04-29 DIAGNOSIS — Z01812 Encounter for preprocedural laboratory examination: Secondary | ICD-10-CM | POA: Insufficient documentation

## 2019-04-29 DIAGNOSIS — Z20828 Contact with and (suspected) exposure to other viral communicable diseases: Secondary | ICD-10-CM | POA: Insufficient documentation

## 2019-04-29 LAB — SARS CORONAVIRUS 2 (TAT 6-24 HRS): SARS Coronavirus 2: NEGATIVE

## 2019-04-29 LAB — BASIC METABOLIC PANEL
Anion gap: 8 (ref 5–15)
BUN: 11 mg/dL (ref 8–23)
CO2: 27 mmol/L (ref 22–32)
Calcium: 9.5 mg/dL (ref 8.9–10.3)
Chloride: 107 mmol/L (ref 98–111)
Creatinine, Ser: 0.7 mg/dL (ref 0.44–1.00)
GFR calc Af Amer: >60 mL/min (ref >60)
GFR calc non Af Amer: >60 mL/min (ref >60)
Glucose, Bld: 114 mg/dL — ABNORMAL HIGH (ref 70–99)
Potassium: 3.8 mmol/L (ref 3.5–5.1)
Sodium: 142 mmol/L (ref 135–145)

## 2019-05-01 ENCOUNTER — Encounter (HOSPITAL_COMMUNITY)
Admission: RE | Disposition: A | Discharge status: Home / Self Care | Payer: Self-pay | Source: Home / Self Care | Attending: Internal Medicine

## 2019-05-01 ENCOUNTER — Ambulatory Visit (HOSPITAL_COMMUNITY): Payer: Medicare Other | Admitting: Certified Registered Nurse Anesthetist

## 2019-05-01 ENCOUNTER — Ambulatory Visit (HOSPITAL_COMMUNITY)
Admission: RE | Admit: 2019-05-01 | Discharge: 2019-05-01 | Disposition: A | Discharge status: Home / Self Care | Payer: Medicare Other | Attending: Internal Medicine | Admitting: Internal Medicine

## 2019-05-01 DIAGNOSIS — E119 Type 2 diabetes mellitus without complications: Secondary | ICD-10-CM | POA: Diagnosis not present

## 2019-05-01 DIAGNOSIS — Z7901 Long term (current) use of anticoagulants: Secondary | ICD-10-CM | POA: Insufficient documentation

## 2019-05-01 DIAGNOSIS — K644 Residual hemorrhoidal skin tags: Secondary | ICD-10-CM | POA: Insufficient documentation

## 2019-05-01 DIAGNOSIS — K625 Hemorrhage of anus and rectum: Secondary | ICD-10-CM

## 2019-05-01 DIAGNOSIS — K219 Gastro-esophageal reflux disease without esophagitis: Secondary | ICD-10-CM | POA: Insufficient documentation

## 2019-05-01 DIAGNOSIS — Z79899 Other long term (current) drug therapy: Secondary | ICD-10-CM | POA: Insufficient documentation

## 2019-05-01 DIAGNOSIS — Z96651 Presence of right artificial knee joint: Secondary | ICD-10-CM | POA: Insufficient documentation

## 2019-05-01 DIAGNOSIS — Z7984 Long term (current) use of oral hypoglycemic drugs: Secondary | ICD-10-CM | POA: Diagnosis not present

## 2019-05-01 DIAGNOSIS — I4891 Unspecified atrial fibrillation: Secondary | ICD-10-CM | POA: Diagnosis not present

## 2019-05-01 DIAGNOSIS — D125 Benign neoplasm of sigmoid colon: Secondary | ICD-10-CM

## 2019-05-01 HISTORY — PX: POLYPECTOMY: SHX5525

## 2019-05-01 HISTORY — PX: COLONOSCOPY WITH PROPOFOL: SHX5780

## 2019-05-01 LAB — GLUCOSE, CAPILLARY: Glucose-Capillary: 117 mg/dL — ABNORMAL HIGH (ref 70–99)

## 2019-05-01 SURGERY — COLONOSCOPY WITH PROPOFOL
Anesthesia: General

## 2019-05-01 MED ORDER — PROMETHAZINE HCL 25 MG/ML IJ SOLN: 6.2500 mg | INTRAMUSCULAR | Status: DC | PRN

## 2019-05-01 MED ORDER — GLYCOPYRROLATE PF 0.2 MG/ML IJ SOSY
PREFILLED_SYRINGE | INTRAMUSCULAR | Status: AC
  Filled 2019-05-01: qty 1

## 2019-05-01 MED ORDER — PROPOFOL 10 MG/ML IV BOLUS
INTRAVENOUS | Status: AC
  Filled 2019-05-01: qty 20

## 2019-05-01 MED ORDER — ONDANSETRON HCL 4 MG/2ML IJ SOLN
INTRAMUSCULAR | Status: AC
  Filled 2019-05-01: qty 2

## 2019-05-01 MED ORDER — KETAMINE HCL 50 MG/5ML IJ SOSY
PREFILLED_SYRINGE | INTRAMUSCULAR | Status: AC
  Filled 2019-05-01: qty 5

## 2019-05-01 MED ORDER — CHLORHEXIDINE GLUCONATE CLOTH 2 % EX PADS: 6.0000 | MEDICATED_PAD | Freq: Once | CUTANEOUS | Status: DC

## 2019-05-01 MED ORDER — PROPOFOL 10 MG/ML IV BOLUS
INTRAVENOUS | Status: DC | PRN
  Administered 2019-05-01: 30 mg via INTRAVENOUS

## 2019-05-01 MED ORDER — KETAMINE HCL 10 MG/ML IJ SOLN
INTRAMUSCULAR | Status: DC | PRN
  Administered 2019-05-01: 30 mg via INTRAVENOUS

## 2019-05-01 MED ORDER — HYDROMORPHONE HCL 1 MG/ML IJ SOLN: 0.2500 mg | INTRAMUSCULAR | Status: DC | PRN

## 2019-05-01 MED ORDER — LIDOCAINE HCL (CARDIAC) PF 100 MG/5ML IV SOSY
PREFILLED_SYRINGE | INTRAVENOUS | Status: DC | PRN
  Administered 2019-05-01: 50 mg via INTRATRACHEAL

## 2019-05-01 MED ORDER — PROPOFOL 500 MG/50ML IV EMUL
INTRAVENOUS | Status: DC | PRN
  Administered 2019-05-01: 100 ug/kg/min via INTRAVENOUS

## 2019-05-01 MED ORDER — LIDOCAINE 2% (20 MG/ML) 5 ML SYRINGE
INTRAMUSCULAR | Status: AC
  Filled 2019-05-01: qty 5

## 2019-05-01 MED ORDER — LACTATED RINGERS IV SOLN
INTRAVENOUS | Status: DC
  Administered 2019-05-01: 10:00:00 via INTRAVENOUS

## 2019-05-01 MED ORDER — GLYCOPYRROLATE 0.2 MG/ML IJ SOLN
INTRAMUSCULAR | Status: DC | PRN
  Administered 2019-05-01: 0.1 mg via INTRAVENOUS

## 2019-05-01 MED ORDER — BENEFIBER DRINK MIX PO PACK: 4.0000 g | PACK | Freq: Every day | ORAL | Status: DC

## 2019-05-01 MED ORDER — HYDROCODONE-ACETAMINOPHEN 7.5-325 MG PO TABS: 1.0000 | ORAL_TABLET | Freq: Once | ORAL | Status: DC | PRN

## 2019-05-01 MED ORDER — ONDANSETRON HCL 4 MG/2ML IJ SOLN
4.0000 mg | Freq: Once | INTRAMUSCULAR | Status: AC
  Administered 2019-05-01: 4 mg via INTRAVENOUS

## 2019-05-01 MED ORDER — DOCUSATE SODIUM 100 MG PO CAPS: 200.0000 mg | ORAL_CAPSULE | Freq: Every day | ORAL | Status: DC

## 2019-05-01 NOTE — Transfer of Care (Signed)
Immediate Anesthesia Transfer of Care Note  Patient: Katrina Castro  Procedure(s) Performed: COLONOSCOPY WITH PROPOFOL (N/A ) POLYPECTOMY  Patient Location: PACU  Anesthesia Type:General  Level of Consciousness: awake  Airway & Oxygen Therapy: Patient Spontanous Breathing  Post-op Assessment: Report given to RN and Post -op Vital signs reviewed and stable  Post vital signs: Reviewed and stable  Last Vitals:  Vitals Value Taken Time  BP    Temp    Pulse 78 05/01/19 1140  Resp 16 05/01/19 1140  SpO2 98 % 05/01/19 1140  Vitals shown include unvalidated device data.  Last Pain:  Vitals:   05/01/19 1115  TempSrc:   PainSc: 0-No pain      Patients Stated Pain Goal: 5 ([id number])  Complications: No apparent anesthesia complications

## 2019-05-01 NOTE — Anesthesia Preprocedure Evaluation (Signed)
Anesthesia Evaluation  Patient identified by MRN, date of birth, ID band Patient awake    Reviewed: Allergy & Precautions, NPO status , Patient's Chart, lab work & pertinent test results  Airway Mallampati: II  TM Distance: >3 FB Neck ROM: Full    Dental no notable dental hx. (+) Edentulous Upper   Pulmonary neg pulmonary ROS,    Pulmonary exam normal breath sounds clear to auscultation       Cardiovascular Exercise Tolerance: Good Normal cardiovascular exam+ dysrhythmias Atrial Fibrillation I Rhythm:Regular Rate:Normal  Reports can walk 5 miles  Denies CP/MI or intervention  H/o Afib on Blood thinners -intermittant use reported  States happened once after botox injection  Appears sinus today    Neuro/Psych  Headaches, PSYCHIATRIC DISORDERS Depression    GI/Hepatic Neg liver ROS, GERD  Medicated and Controlled,Achalasia vs dysmotility issues    Endo/Other  negative endocrine ROSdiabetes  Renal/GU negative Renal ROS  negative genitourinary   Musculoskeletal  (+) Arthritis , Osteoarthritis,    Abdominal   Peds negative pediatric ROS (+)  Hematology negative hematology ROS (+)   Anesthesia Other Findings   Reproductive/Obstetrics negative OB ROS                             Anesthesia Physical Anesthesia Plan  ASA: III  Anesthesia Plan: General   Post-op Pain Management:    Induction: Intravenous  PONV Risk Score and Plan: 3 and TIVA, Propofol infusion, Ondansetron and Treatment may vary due to age or medical condition  Airway Management Planned: Simple Face Mask and Nasal Cannula  Additional Equipment:   Intra-op Plan:   Post-operative Plan:   Informed Consent: I have reviewed the patients History and Physical, chart, labs and discussed the procedure including the risks, benefits and alternatives for the proposed anesthesia with the patient or authorized representative who  has indicated his/her understanding and acceptance.     Dental advisory given  Plan Discussed with: CRNA  Anesthesia Plan Comments: (Plan Full PPE use  Plan GA with GETA as needed d/w pt -WTP with same after Q&A  State stomach feels empty - is at higher risk for ETT need due to dysmotility issues )        Anesthesia Quick Evaluation

## 2019-05-01 NOTE — Op Note (Signed)
Katrina Castro Procedure Date: 05/01/2019 10:30 AM MRN: [id number] Date of Birth: 1945/08/17 Attending MD: Hildred Laser , MD CSN: [id number] Age: 73 Admit Type: Outpatient Procedure:                Colonoscopy Indications:              Rectal bleeding Providers:                Hildred Laser, MD, Jeanann Lewandowsky. Sharon Seller, RN, Raphael Gibney, Technician Referring MD:             Glenda Chroman, MD Medicines:                Propofol per Anesthesia Complications:            No immediate complications. Estimated Blood Loss:     Estimated blood loss was minimal. Procedure:                Pre-Anesthesia Assessment:                           - Prior to the procedure, a History and Physical                            was performed, and patient medications and                            allergies were reviewed. The patient's tolerance of                            previous anesthesia was also reviewed. The risks                            and benefits of the procedure and the sedation                            options and risks were discussed with the patient.                            All questions were answered, and informed consent                            was obtained. Prior Anticoagulants: The patient                            last took Xarelto (rivaroxaban) 3 days prior to the                            procedure. ASA Grade Assessment: III - A patient                            with severe systemic disease. After reviewing the  risks and benefits, the patient was deemed in                            satisfactory condition to undergo the procedure.                           After obtaining informed consent, the colonoscope                            was passed under direct vision. Throughout the                            procedure, the patient's blood pressure, pulse, and                            oxygen  saturations were monitored continuously. The                            [id number]-[id number] scope was introduced through                            the anus and advanced to the the terminal ileum,                            with identification of the appendiceal orifice and                            IC valve. The colonoscopy was performed without                            difficulty. The patient tolerated the procedure                            well. The quality of the bowel preparation was                            good. The terminal ileum, ileocecal valve,                            appendiceal orifice, and rectum were photographed. Scope In: 11:17:02 AM Scope Out: 11:32:11 AM Scope Withdrawal Time: 0 hours 10 minutes 22 seconds  Total Procedure Duration: 0 hours 15 minutes 9 seconds  Findings:      The perianal and digital rectal examinations were normal.      The terminal ileum appeared normal.      A small polyp was found in the proximal sigmoid colon. The polyp was       sessile. Biopsies were taken with a cold forceps for histology.      The exam was otherwise normal throughout the examined colon.      External hemorrhoids were found during retroflexion. The hemorrhoids       were small. Impression:               - The examined portion of the ileum was normal.                           -  One small polyp in the proximal sigmoid colon.                            Biopsied.                           - External hemorrhoids. Moderate Sedation:      Per Anesthesia Care Recommendation:           - Patient has a contact number available for                            emergencies. The signs and symptoms of potential                            delayed complications were discussed with the                            patient. Return to normal activities tomorrow.                            Written discharge instructions were provided to the                            patient.                            - High fiber diet today.                           - Continue present medications.                           - Resume Xarelto (rivaroxaban) at prior dose                            tomorrow.                           - Await pathology results.                           - Colace 200 mg po qhs.                           - Benefiber 4 g po qhs.                           - Repeat colonoscopy is recommended. The                            colonoscopy date will be determined after pathology                            results from today's exam become available for  review. Procedure Code(s):        --- Professional ---                           [phone], Colonoscopy, flexible; with biopsy, single                            or multiple Diagnosis Code(s):        --- Professional ---                           K64.4, Residual hemorrhoidal skin tags                           K63.5, Polyp of colon                           K62.5, Hemorrhage of anus and rectum CPT copyright 2019 [employer]. All rights reserved. The codes documented in this report are preliminary and upon coder review may  be revised to meet current compliance requirements. Hildred Laser, MD Hildred Laser, MD 05/01/2019 11:45:24 AM This report has been signed electronically. Number of Addenda: 0

## 2019-05-01 NOTE — Discharge Instructions (Signed)
Resume Xarelto on 05/02/2019. Resume other medications as before Take Colace 200 mg by mouth daily at bedtime Benefiber or equivalent 4 g by mouth daily at bedtime. High-fiber diet. No driving for 24 hours. Physician will call with biopsy results. Keep stool diary as to frequency and consistency of stools for the next 1 month and send summary to the office    Colonoscopy, Adult, Care After This sheet gives you information about how to care for yourself after your procedure. Your doctor may also give you more specific instructions. If you have problems or questions, call your doctor. What can I expect after the procedure? After the procedure, it is common to have:  A small amount of blood in your poop for 24 hours.  Some gas.  Mild cramping or bloating in your belly. Follow these instructions at home: General instructions  For the first 24 hours after the procedure: ? Do not drive or use machinery. ? Do not sign important documents. ? Do not drink alcohol. ? Do your daily activities more slowly than normal. ? Eat foods that are soft and easy to digest.  Take over-the-counter or prescription medicines only as told by your doctor. To help cramping and bloating:   Try walking around.  Put heat on your belly (abdomen) as told by your doctor. Use a heat source that your doctor recommends, such as a moist heat pack or a heating pad. ? Put a towel between your skin and the heat source. ? Leave the heat on for 20-30 minutes. ? Remove the heat if your skin turns bright red. This is especially important if you cannot feel pain, heat, or cold. You can get burned. Eating and drinking   Drink enough fluid to keep your pee (urine) clear or pale yellow.  Return to your normal diet as told by your doctor. Avoid heavy or fried foods that are hard to digest.  Avoid drinking alcohol for as long as told by your doctor. Contact a doctor if:  You have blood in your poop (stool) 2-3 days  after the procedure. Get help right away if:  You have more than a small amount of blood in your poop.  You see large clumps of tissue (blood clots) in your poop.  Your belly is swollen.  You feel sick to your stomach (nauseous).  You throw up (vomit).  You have a fever.  You have belly pain that gets worse, and medicine does not help your pain. Summary  After the procedure, it is common to have a small amount of blood in your poop. You may also have mild cramping and bloating in your belly.  For the first 24 hours after the procedure, do not drive or use machinery, do not sign important documents, and do not drink alcohol.  Get help right away if you have a lot of blood in your poop, feel sick to your stomach, have a fever, or have more belly pain. This information is not intended to replace advice given to you by your health care provider. Make sure you discuss any questions you have with your health care provider. Document Released: 07/07/2010 Document Revised: 04/04/2017 Document Reviewed: 02/27/2016 Elsevier Patient Education  [place].    High-Fiber Diet Fiber, also called dietary fiber, is a type of carbohydrate that is found in fruits, vegetables, whole grains, and beans. A high-fiber diet can have many health benefits. Your health care provider may recommend a high-fiber diet to help:  Prevent constipation. Fiber  can make your bowel movements more regular.  Lower your cholesterol.  Relieve the following conditions: ? Swelling of veins in the anus (hemorrhoids). ? Swelling and irritation (inflammation) of specific areas of the digestive tract (uncomplicated diverticulosis). ? A problem of the large intestine (colon) that sometimes causes pain and diarrhea (irritable bowel syndrome, IBS).  Prevent overeating as part of a weight-loss plan.  Prevent heart disease, type 2 diabetes, and certain cancers. What is my plan? The recommended daily fiber intake in  grams (g) includes:  38 g for men age 59 or younger.  30 g for men over age 70.  87 g for women age 86 or younger.  21 g for women over age 13. You can get the recommended daily intake of dietary fiber by:  Eating a variety of fruits, vegetables, grains, and beans.  Taking a fiber supplement, if it is not possible to get enough fiber through your diet. What do I need to know about a high-fiber diet?  It is better to get fiber through food sources rather than from fiber supplements. There is not a lot of research about how effective supplements are.  Always check the fiber content on the nutrition facts label of any prepackaged food. Look for foods that contain 5 g of fiber or more per serving.  Talk with a diet and nutrition specialist (dietitian) if you have questions about specific foods that are recommended or not recommended for your medical condition, especially if those foods are not listed below.  Gradually increase how much fiber you consume. If you increase your intake of dietary fiber too quickly, you may have bloating, cramping, or gas.  Drink plenty of water. Water helps you to digest fiber. What are tips for following this plan?  Eat a wide variety of high-fiber foods.  Make sure that half of the grains that you eat each day are whole grains.  Eat breads and cereals that are made with whole-grain flour instead of refined flour or white flour.  Eat brown rice, bulgur wheat, or millet instead of white rice.  Start the day with a breakfast that is high in fiber, such as a cereal that contains 5 g of fiber or more per serving.  Use beans in place of meat in soups, salads, and pasta dishes.  Eat high-fiber snacks, such as berries, raw vegetables, nuts, and popcorn.  Choose whole fruits and vegetables instead of processed forms like juice or sauce. What foods can I eat?  Fruits Berries. Pears. Apples. Oranges. Avocado. Prunes and raisins. Dried  figs. Vegetables Sweet potatoes. Spinach. Kale. Artichokes. Cabbage. Broccoli. Cauliflower. Green peas. Carrots. Squash. Grains Whole-grain breads. Multigrain cereal. Oats and oatmeal. Brown rice. Barley. Bulgur wheat. [place]. Quinoa. Bran muffins. Popcorn. Rye wafer crackers. Meats and other proteins Navy, kidney, and pinto beans. Soybeans. Split peas. Lentils. Nuts and seeds. Dairy Fiber-fortified yogurt. Beverages Fiber-fortified soy milk. Fiber-fortified orange juice. Other foods Fiber bars. The items listed above may not be a complete list of recommended foods and beverages. Contact a dietitian for more options. What foods are not recommended? Fruits Fruit juice. Cooked, strained fruit. Vegetables Fried potatoes. Canned vegetables. Well-cooked vegetables. Grains White bread. Pasta made with refined flour. White rice. Meats and other proteins Fatty cuts of meat. Fried chicken or fried fish. Dairy Milk. Yogurt. Cream cheese. Sour cream. Fats and oils Butters. Beverages Soft drinks. Other foods Cakes and pastries. The items listed above may not be a complete list of foods and beverages to  avoid. Contact a dietitian for more information. Summary  Fiber is a type of carbohydrate. It is found in fruits, vegetables, whole grains, and beans.  There are many health benefits of eating a high-fiber diet, such as preventing constipation, lowering blood cholesterol, helping with weight loss, and reducing your risk of heart disease, diabetes, and certain cancers.  Gradually increase your intake of fiber. Increasing too fast can result in cramping, bloating, and gas. Drink plenty of water while you increase your fiber.  The best sources of fiber include whole fruits and vegetables, whole grains, nuts, seeds, and beans. This information is not intended to replace advice given to you by your health care provider. Make sure you discuss any questions you have with your health care  provider. Document Released: 06/04/2005 Document Revised: 04/08/2017 Document Reviewed: 04/08/2017 Elsevier Patient Education  2020 [place] After These instructions provide you with information about caring for yourself after your procedure. Your health care provider may also give you more specific instructions. Your treatment has been planned according to current medical practices, but problems sometimes occur. Call your health care provider if you have any problems or questions after your procedure. What can I expect after the procedure? After your procedure, you may:  Feel sleepy for several hours.  Feel clumsy and have poor balance for several hours.  Feel forgetful about what happened after the procedure.  Have poor judgment for several hours.  Feel nauseous or vomit.  Have a sore throat if you had a breathing tube during the procedure. Follow these instructions at home: For at least 24 hours after the procedure:      Have a responsible adult stay with you. It is important to have someone help care for you until you are awake and alert.  Rest as needed.  Do not: ? Participate in activities in which you could fall or become injured. ? Drive. ? Use heavy machinery. ? Drink alcohol. ? Take sleeping pills or medicines that cause drowsiness. ? Make important decisions or sign legal documents. ? Take care of children on your own. Eating and drinking  Follow the diet that is recommended by your health care provider.  If you vomit, drink water, juice, or soup when you can drink without vomiting.  Make sure you have little or no nausea before eating solid foods. General instructions  Take over-the-counter and prescription medicines only as told by your health care provider.  If you have sleep apnea, surgery and certain medicines can increase your risk for breathing problems. Follow instructions from your health care provider  about wearing your sleep device: ? Anytime you are sleeping, including during daytime naps. ? While taking prescription pain medicines, sleeping medicines, or medicines that make you drowsy.  If you smoke, do not smoke without supervision.  Keep all follow-up visits as told by your health care provider. This is important. Contact a health care provider if:  You keep feeling nauseous or you keep vomiting.  You feel light-headed.  You develop a rash.  You have a fever. Get help right away if:  You have trouble breathing. Summary  For several hours after your procedure, you may feel sleepy and have poor judgment.  Have a responsible adult stay with you for at least 24 hours or until you are awake and alert. This information is not intended to replace advice given to you by your health care provider. Make sure you discuss any questions you have  with your health care provider. Document Released: 09/25/2015 Document Revised: 09/02/2017 Document Reviewed: 09/25/2015 Elsevier Patient Education  2020 [employer].

## 2019-05-01 NOTE — H&P (Signed)
Katrina Castro is an 73 y.o. female.   Chief Complaint: Patient is here for diagnostic colonoscopy HPI: Patient is 73 year old Caucasian female who presents with several week history of intermittent rectal bleeding.  She states she passes large amount of bright blood per rectum.  She also complains of irregular bowel movements.  She either has diarrhea and/or constipation.  At times she passes pieces of solids stool that she said look like small eggs.  She has been trying Colace but she does not take it daily.  She states her eating habits are not good.  She is on Xarelto for atrial fibrillation.  Xarelto was put on hold for this procedure. Last colonoscopy was in May 2014 revealing small external hemorrhoids. Family history is negative for CRC.  Past Medical History:  Diagnosis Date  . Arthritis   . Concussion    2015  . Depression   . Diabetes ([place])    type 2  . Dysphagia   . Dysrhythmia    afib  . GERD (gastroesophageal reflux disease)   . Headache   . History of bronchitis   . Seasonal allergies   . Toenail fungus     Past Surgical History:  Procedure Laterality Date  . APPENDECTOMY    . BACK SURGERY     x2  . BOTOX INJECTION N/A 06/06/2016   Procedure: BOTOX INJECTION;  Surgeon: Rogene Houston, MD;  [facility];  Service: Endoscopy;  Laterality: N/A;  . CHOLECYSTECTOMY    . COLONOSCOPY N/A 11/05/2012   Procedure: COLONOSCOPY;  Surgeon: Rogene Houston, MD;  [facility];  Service: Endoscopy;  Laterality: N/A;  1030  . ESOPHAGEAL DILATION N/A 07/06/2015   Procedure: ESOPHAGEAL DILATION;  Surgeon: Rogene Houston, MD;  [facility];  Service: Endoscopy;  Laterality: N/A;  . ESOPHAGEAL DILATION N/A 06/06/2016   Procedure: ESOPHAGEAL DILATION;  Surgeon: Rogene Houston, MD;  [facility];  Service: Endoscopy;  Laterality: N/A;  . ESOPHAGOGASTRODUODENOSCOPY N/A 02/12/2014   Procedure: ESOPHAGOGASTRODUODENOSCOPY (EGD);  Surgeon:  Rogene Houston, MD;  [facility];  Service: Endoscopy;  Laterality: N/A;  150  . ESOPHAGOGASTRODUODENOSCOPY N/A 07/06/2015   Procedure: ESOPHAGOGASTRODUODENOSCOPY (EGD);  Surgeon: Rogene Houston, MD;  [facility];  Service: Endoscopy;  Laterality: N/A;  1:25 - moved to 1/18 @ 10:30 - Ann to notify pt  . ESOPHAGOGASTRODUODENOSCOPY (EGD) WITH ESOPHAGEAL DILATION N/A 07/25/2012   Procedure: ESOPHAGOGASTRODUODENOSCOPY (EGD) WITH ESOPHAGEAL DILATION;  Surgeon: Rogene Houston, MD;  [facility];  Service: Endoscopy;  Laterality: N/A;  325-rescheduled to [place] notified pt  . ESOPHAGOGASTRODUODENOSCOPY (EGD) WITH PROPOFOL N/A 06/06/2016   Procedure: ESOPHAGOGASTRODUODENOSCOPY (EGD) WITH PROPOFOL;  Surgeon: Rogene Houston, MD;  [facility];  Service: Endoscopy;  Laterality: N/A;  . EYE SURGERY     cataracts removed  . Foot surgeries Bilateral    hammer toes  . MALONEY DILATION N/A 02/12/2014   Procedure: Venia Minks DILATION;  Surgeon: Rogene Houston, MD;  [facility];  Service: Endoscopy;  Laterality: N/A;  . Right knee arthroscopy     x2  . TONSILLECTOMY    . TOTAL ABDOMINAL HYSTERECTOMY    . TOTAL KNEE ARTHROPLASTY Right 04/03/2017   Procedure: RIGHT TOTAL KNEE ARTHROPLASTY;  Surgeon: Latanya Maudlin, MD;  Location: [facility];  Service: Orthopedics;  Laterality: Right;    Family History  Problem Relation Age of Onset  . Stroke Father   .  Diabetes Mother   . Diabetes Sister   . Colon cancer Neg Hx    Social History:  reports that she has never smoked. She has never used smokeless tobacco. She reports that she does not drink alcohol or use drugs.  Allergies:  Allergies  Allergen Reactions  . Levofloxacin Anaphylaxis and Rash  . Cardizem [Diltiazem] Other (See Comments)    Patient states that she could not think, felt that she was in the twilight zone. Arm discomfort.  . Sulfa Antibiotics Itching  . Diazepam Nausea Only     Medications Prior to Admission  Medication Sig Dispense Refill  . acyclovir (ZOVIRAX) 400 MG tablet Take 400 mg by mouth daily.    Marland Kitchen ALPRAZolam (XANAX) 0.5 MG tablet Take 0.25 mg by mouth at bedtime as needed.    . cetirizine (ZYRTEC) 10 MG tablet Take 10 mg by mouth daily.    Marland Kitchen glimepiride (AMARYL) 2 MG tablet Take 2 mg by mouth as needed.    . metFORMIN (GLUCOPHAGE) 500 MG tablet Take 500 mg by mouth 2 (two) times daily.    . metoprolol (LOPRESSOR) 50 MG tablet Take 50 mg by mouth daily.     Marland Kitchen oxybutynin (DITROPAN-XL) 10 MG 24 hr tablet Take 10 mg by mouth daily.    . pantoprazole (PROTONIX) 40 MG tablet Take 1 tablet (40 mg total) by mouth at bedtime. (Patient taking differently: Take 40 mg by mouth daily. ) 30 tablet 5  . XARELTO 20 MG TABS tablet TAKE 1 TABLET BY MOUTH DAILY WITH SUPPER. (Patient taking differently: Take 20 mg by mouth 3 (three) times a week. ) 30 tablet 6    Results for orders placed or performed during the hospital encounter of 04/29/19 (from the past 48 hour(s))  Basic metabolic panel     Status: Abnormal   Collection Time: 04/29/19 11:35 AM  Result Value Ref Range   Sodium 142 135 - 145 mmol/L   Potassium 3.8 3.5 - 5.1 mmol/L   Chloride 107 98 - 111 mmol/L   CO2 27 22 - 32 mmol/L   Glucose, Bld 114 (H) 70 - 99 mg/dL   BUN 11 8 - 23 mg/dL   Creatinine, Ser 0.70 0.44 - 1.00 mg/dL   Calcium 9.5 8.9 - 10.3 mg/dL   GFR calc non Af Amer >60 >60 mL/min   GFR calc Af Amer >60 >60 mL/min   Anion gap 8 5 - 15    Comment: Performed at [facility], [address], [place], [place] [address]   No results found.  ROS  Blood pressure (!) 141/43, pulse 62, temperature 98.4 F (36.9 C), temperature source Oral, resp. rate 18, height 5\' 3"  (1.6 m), weight 66.2 kg, SpO2 98 %. Physical Exam  Constitutional: She appears well-developed and well-nourished.  HENT:  Mouth/Throat: Oropharynx is clear and moist.  Eyes: Conjunctivae are normal. No scleral icterus.  Neck:  No thyromegaly present.  Cardiovascular: Normal rate, regular rhythm and normal heart sounds.  No murmur heard. Respiratory: Effort normal and breath sounds normal.  GI:  Abdomen is full.  She has Pfannenstiel scar along with horizontal scar just below the level of umbilicus.  On palpation abdomen is soft and nontender without organomegaly or masses.  Musculoskeletal:        General: No edema.  Lymphadenopathy:    She has no cervical adenopathy.  Neurological: She is alert.  Skin: Skin is warm and dry.     Assessment/Plan Rectal bleeding and irregular bowel  habits. Diagnostic colonoscopy.  Hildred Laser, MD 05/01/2019, 10:59 AM

## 2019-05-01 NOTE — Anesthesia Postprocedure Evaluation (Signed)
Anesthesia Post Note  Patient: Katrina Castro  Procedure(s) Performed: COLONOSCOPY WITH PROPOFOL (N/A ) POLYPECTOMY  Patient location during evaluation: PACU Anesthesia Type: General Level of consciousness: awake and alert Pain management: pain level controlled Vital Signs Assessment: post-procedure vital signs reviewed and stable Respiratory status: spontaneous breathing, nonlabored ventilation and respiratory function stable Cardiovascular status: stable Postop Assessment: no apparent nausea or vomiting Anesthetic complications: no     Last Vitals:  Vitals:   05/01/19 0951  BP: (!) 141/43  Pulse: 62  Resp: 18  Temp: 36.9 C  SpO2: 98%    Last Pain:  Vitals:   05/01/19 1115  TempSrc:   PainSc: 0-No pain                 Shiryl Ruddy Hristova

## 2019-05-03 ENCOUNTER — Telehealth (INDEPENDENT_AMBULATORY_CARE_PROVIDER_SITE_OTHER): Payer: Self-pay | Admitting: Internal Medicine

## 2019-05-03 NOTE — Telephone Encounter (Signed)
Patient called to let me know that she was bloated and nauseated. No nausea vomiting diarrhea or bleeding. She states she was fine until she ate beef stew yesterday. Patient advised to stay on full liquids today and can take Maalox plus or Maalox plus simethicone. If symptoms worsen she can go to emergency room.

## 2019-05-04 LAB — SURGICAL PATHOLOGY

## 2019-05-06 ENCOUNTER — Encounter (HOSPITAL_COMMUNITY): Payer: Self-pay | Admitting: Internal Medicine

## 2019-06-30 ENCOUNTER — Telehealth: Payer: Self-pay | Admitting: Cardiovascular Disease

## 2019-06-30 ENCOUNTER — Other Ambulatory Visit: Payer: Self-pay | Admitting: *Deleted

## 2019-06-30 ENCOUNTER — Ambulatory Visit (INDEPENDENT_AMBULATORY_CARE_PROVIDER_SITE_OTHER): Payer: Medicare Other | Admitting: Cardiovascular Disease

## 2019-06-30 ENCOUNTER — Other Ambulatory Visit: Payer: Self-pay

## 2019-06-30 ENCOUNTER — Encounter: Payer: Self-pay | Admitting: Cardiovascular Disease

## 2019-06-30 ENCOUNTER — Encounter: Payer: Self-pay | Admitting: *Deleted

## 2019-06-30 VITALS — BP 142/82 | HR 62 | Ht 63.0 in | Wt 147.0 lb

## 2019-06-30 DIAGNOSIS — R072 Precordial pain: Secondary | ICD-10-CM

## 2019-06-30 DIAGNOSIS — I480 Paroxysmal atrial fibrillation: Secondary | ICD-10-CM

## 2019-06-30 MED ORDER — RIVAROXABAN 20 MG PO TABS: ORAL_TABLET | ORAL | 6 refills | Status: DC

## 2019-06-30 NOTE — Patient Instructions (Addendum)
Medication Instructions:   Your physician recommends that you continue on your current medications as directed. Please refer to the Current Medication list given to you today.  Labwork:  NONE  Testing/Procedures:   Your physician has requested that you have a lexiscan myoview. For further information please visit [email or web address]. Please follow instruction sheet, as given.  Follow-Up:  Your physician recommends that you schedule a follow-up appointment in: 3 months (virtual).  Any Other Special Instructions Will Be Listed Below (If Applicable).  If you need a refill on your cardiac medications before your next appointment, please call your pharmacy.

## 2019-06-30 NOTE — Telephone Encounter (Signed)
Pre-cert Verification for the following procedure    Lexiscan scheduled for 07/10/2019 at [facility]

## 2019-06-30 NOTE — Progress Notes (Signed)
SUBJECTIVE: The patient presents for follow-up of paroxysmal atrial fibrillation.  Echocardiogram 05/18/16: Normal left ventricular systolic function, LVEF [id number], grade 1 diastolic dysfunction. The left atrium was normal in size.  She has been experiencing exertional chest tightness for the past 6 weeks.  It is located in the upper part of her central chest.  She wonders if it swollen.  It is radiated down the left arm.  She has had associated shortness of breath.  She also has a history of esophageal dysmotility with objective data supporting this on 12/01/2018.  She denies leg swelling, orthopnea, and paroxysmal nocturnal dyspnea.  ECG performed today which appears review demonstrates sinus rhythm with no ischemic ST segment or T wave abnormalities, nor any arrhythmias.    SocHx:Used to drive a forklift for 30 yrs. Divorced. Son in [place], daughter in [place].  Review of Systems: As per "subjective", otherwise negative.  Allergies  Allergen Reactions  . Levofloxacin Anaphylaxis and Rash  . Cardizem [Diltiazem] Other (See Comments)    Patient states that she could not think, felt that she was in the twilight zone. Arm discomfort.  . Sulfa Antibiotics Itching  . Diazepam Nausea Only    Current Outpatient Medications  Medication Sig Dispense Refill  . ALPRAZolam (XANAX) 0.5 MG tablet Take 0.25 mg by mouth at bedtime as needed.    . cetirizine (ZYRTEC) 10 MG tablet Take 10 mg by mouth daily.    Marland Kitchen docusate sodium (COLACE) 100 MG capsule Take 2 capsules (200 mg total) by mouth at bedtime.    Marland Kitchen glimepiride (AMARYL) 2 MG tablet Take 2 mg by mouth as needed.    . metFORMIN (GLUCOPHAGE) 500 MG tablet Take 500 mg by mouth 2 (two) times daily.    . metoprolol (LOPRESSOR) 50 MG tablet Take 50 mg by mouth daily.     Marland Kitchen oxybutynin (DITROPAN-XL) 10 MG 24 hr tablet Take 10 mg by mouth daily.    . pantoprazole (PROTONIX) 40 MG tablet Take 1 tablet (40 mg total) by mouth at  bedtime. (Patient taking differently: Take 40 mg by mouth daily. ) 30 tablet 5  . Wheat Dextrin (BENEFIBER DRINK MIX) PACK Take 4 g by mouth at bedtime.    Alveda Reasons 20 MG TABS tablet TAKE 1 TABLET BY MOUTH DAILY WITH SUPPER. (Patient taking differently: Take 20 mg by mouth 3 (three) times a week. ) 30 tablet 6   No current facility-administered medications for this visit.    Past Medical History:  Diagnosis Date  . Arthritis   . Concussion    2015  . Depression   . Diabetes ([place])    type 2  . Dysphagia   . Dysrhythmia    afib  . GERD (gastroesophageal reflux disease)   . Headache   . History of bronchitis   . Seasonal allergies   . Toenail fungus     Past Surgical History:  Procedure Laterality Date  . APPENDECTOMY    . BACK SURGERY     x2  . BOTOX INJECTION N/A 06/06/2016   Procedure: BOTOX INJECTION;  Surgeon: Rogene Houston, MD;  [facility];  Service: Endoscopy;  Laterality: N/A;  . CHOLECYSTECTOMY    . COLONOSCOPY N/A 11/05/2012   Procedure: COLONOSCOPY;  Surgeon: Rogene Houston, MD;  [facility];  Service: Endoscopy;  Laterality: N/A;  1030  . COLONOSCOPY WITH PROPOFOL N/A 05/01/2019   Procedure: COLONOSCOPY WITH PROPOFOL;  Surgeon: Hildred Laser  U, MD;  [facility];  Service: Endoscopy;  Laterality: N/A;  11:20am  . ESOPHAGEAL DILATION N/A 07/06/2015   Procedure: ESOPHAGEAL DILATION;  Surgeon: Rogene Houston, MD;  [facility];  Service: Endoscopy;  Laterality: N/A;  . ESOPHAGEAL DILATION N/A 06/06/2016   Procedure: ESOPHAGEAL DILATION;  Surgeon: Rogene Houston, MD;  [facility];  Service: Endoscopy;  Laterality: N/A;  . ESOPHAGOGASTRODUODENOSCOPY N/A 02/12/2014   Procedure: ESOPHAGOGASTRODUODENOSCOPY (EGD);  Surgeon: Rogene Houston, MD;  [facility];  Service: Endoscopy;  Laterality: N/A;  150  . ESOPHAGOGASTRODUODENOSCOPY N/A 07/06/2015   Procedure: ESOPHAGOGASTRODUODENOSCOPY (EGD);   Surgeon: Rogene Houston, MD;  [facility];  Service: Endoscopy;  Laterality: N/A;  1:25 - moved to 1/18 @ 10:30 - Ann to notify pt  . ESOPHAGOGASTRODUODENOSCOPY (EGD) WITH ESOPHAGEAL DILATION N/A 07/25/2012   Procedure: ESOPHAGOGASTRODUODENOSCOPY (EGD) WITH ESOPHAGEAL DILATION;  Surgeon: Rogene Houston, MD;  [facility];  Service: Endoscopy;  Laterality: N/A;  325-rescheduled to [place] notified pt  . ESOPHAGOGASTRODUODENOSCOPY (EGD) WITH PROPOFOL N/A 06/06/2016   Procedure: ESOPHAGOGASTRODUODENOSCOPY (EGD) WITH PROPOFOL;  Surgeon: Rogene Houston, MD;  [facility];  Service: Endoscopy;  Laterality: N/A;  . EYE SURGERY     cataracts removed  . Foot surgeries Bilateral    hammer toes  . MALONEY DILATION N/A 02/12/2014   Procedure: Venia Minks DILATION;  Surgeon: Rogene Houston, MD;  [facility];  Service: Endoscopy;  Laterality: N/A;  . POLYPECTOMY  05/01/2019   Procedure: POLYPECTOMY;  Surgeon: Rogene Houston, MD;  [facility];  Service: Endoscopy;;  colon   . Right knee arthroscopy     x2  . TONSILLECTOMY    . TOTAL ABDOMINAL HYSTERECTOMY    . TOTAL KNEE ARTHROPLASTY Right 04/03/2017   Procedure: RIGHT TOTAL KNEE ARTHROPLASTY;  Surgeon: Latanya Maudlin, MD;  [facility];  Service: Orthopedics;  Laterality: Right;    Social History   Socioeconomic History  . Marital status: Divorced    Spouse name: Not on file  . Number of children: Not on file  . Years of education: Not on file  . Highest education level: Not on file  Occupational History  . Not on file  Tobacco Use  . Smoking status: Never Smoker  . Smokeless tobacco: Never Used  Substance and Sexual Activity  . Alcohol use: No    Alcohol/week: 0.0 standard drinks    Comment: socially   . Drug use: No  . Sexual activity: Not Currently    Partners: Male    Birth control/protection: None  Other Topics Concern  . Not on file  Social History Narrative  . Not on  file   Social Determinants of Health   Financial Resource Strain:   . Difficulty of Paying Living Expenses: Not on file  Food Insecurity:   . Worried About Charity fundraiser in the Last Year: Not on file  . Ran Out of Food in the Last Year: Not on file  Transportation Needs:   . Lack of Transportation (Medical): Not on file  . Lack of Transportation (Non-Medical): Not on file  Physical Activity:   . Days of Exercise per Week: Not on file  . Minutes of Exercise per Session: Not on file  Stress:   . Feeling of Stress : Not on file  Social Connections:   . Frequency of Communication with Friends and Family: Not on file  .  Frequency of Social Gatherings with Friends and Family: Not on file  . Attends Religious Services: Not on file  . Active Member of Clubs or Organizations: Not on file  . Attends Archivist Meetings: Not on file  . Marital Status: Not on file  Intimate Partner Violence:   . Fear of Current or Ex-Partner: Not on file  . Emotionally Abused: Not on file  . Physically Abused: Not on file  . Sexually Abused: Not on file     Vitals:   06/30/19 1529  BP: (!) 142/82  Pulse: 62  SpO2: 98%  Weight: 147 lb (66.7 kg)  Height: 5\' 3"  (1.6 m)    Wt Readings from Last 3 Encounters:  06/30/19 147 lb (66.7 kg)  05/01/19 146 lb (66.2 kg)  04/16/19 146 lb 12.8 oz (66.6 kg)     PHYSICAL EXAM General: NAD HEENT: Normal. Neck: No JVD, no thyromegaly. Lungs: Clear to auscultation bilaterally with normal respiratory effort. CV: Regular rate and rhythm, normal S1/S2, no S3/S4, no murmur. No pretibial or periankle edema.  No carotid bruit.   Abdomen: Soft, nontender, no distention.  Neurologic: Alert and oriented.  Psych: Normal affect. Skin: Normal. Musculoskeletal: No gross deformities.      Labs: Lab Results  Component Value Date/Time   K 3.8 04/29/2019 11:35 AM   BUN 11 04/29/2019 11:35 AM   CREATININE 0.70 04/29/2019 11:35 AM   ALT 22  03/25/2017 11:46 AM   TSH 1.118 05/18/2016 07:23 PM   HGB 12.3 04/06/2017 06:17 AM     Lipids: No results found for: LDLCALC, LDLDIRECT, CHOL, TRIG, HDL     ASSESSMENT AND PLAN: 1. Paroxysmal atrial fibrillation:Symptomatically stable on metoprolol. Continue Xarelto 20 mg daily.  2.  Chest pain and shortness of breath: While she has a history of esophageal dysmotility, ischemic heart disease will be ruled out. I will proceed with a nuclear myocardial perfusion imaging study to evaluate for ischemic heart disease (Lexiscan Myoview).     Disposition: Follow up 3 months virtual visit   Kate Sable, M.D., F.A.C.C.

## 2019-07-10 ENCOUNTER — Encounter (HOSPITAL_COMMUNITY): Payer: Self-pay

## 2019-07-10 ENCOUNTER — Other Ambulatory Visit: Payer: Self-pay

## 2019-07-10 ENCOUNTER — Encounter (HOSPITAL_BASED_OUTPATIENT_CLINIC_OR_DEPARTMENT_OTHER)
Admission: RE | Admit: 2019-07-10 | Discharge: 2019-07-10 | Disposition: A | Discharge status: Home / Self Care | Payer: Medicare Other | Source: Ambulatory Visit | Attending: Cardiovascular Disease | Admitting: Cardiovascular Disease

## 2019-07-10 ENCOUNTER — Encounter (HOSPITAL_COMMUNITY)
Admission: RE | Admit: 2019-07-10 | Discharge: 2019-07-10 | Disposition: A | Discharge status: Home / Self Care | Payer: Medicare Other | Source: Ambulatory Visit | Attending: Cardiovascular Disease | Admitting: Cardiovascular Disease

## 2019-07-10 DIAGNOSIS — R072 Precordial pain: Secondary | ICD-10-CM | POA: Insufficient documentation

## 2019-07-10 HISTORY — DX: Essential (primary) hypertension: I10

## 2019-07-10 LAB — NM MYOCAR MULTI W/SPECT W/WALL MOTION / EF
LV dias vol: 52 mL (ref 46–106)
LV sys vol: 20 mL
Peak HR: 115 {beats}/min
RATE: 0.42
Rest HR: 80 {beats}/min
SDS: 5
SRS: 2
SSS: 7
TID: 1.61

## 2019-07-10 MED ORDER — REGADENOSON 0.4 MG/5ML IV SOLN
INTRAVENOUS | Status: AC
  Administered 2019-07-10: 10:00:00 0.4 mg via INTRAVENOUS
  Filled 2019-07-10: qty 5

## 2019-07-10 MED ORDER — TECHNETIUM TC 99M TETROFOSMIN IV KIT
10.0000 | PACK | Freq: Once | INTRAVENOUS | Status: AC | PRN
  Administered 2019-07-10: 10.2 via INTRAVENOUS

## 2019-07-10 MED ORDER — TECHNETIUM TC 99M TETROFOSMIN IV KIT
30.0000 | PACK | Freq: Once | INTRAVENOUS | Status: AC | PRN
  Administered 2019-07-10: 10:00:00 28.2 via INTRAVENOUS

## 2019-07-10 MED ORDER — SODIUM CHLORIDE FLUSH 0.9 % IV SOLN
INTRAVENOUS | Status: AC
  Administered 2019-07-10: 10:00:00 10 mL via INTRAVENOUS
  Filled 2019-07-10: qty 10

## 2019-07-13 ENCOUNTER — Telehealth: Payer: Self-pay | Admitting: *Deleted

## 2019-07-13 NOTE — Telephone Encounter (Signed)
-----   Message from Herminio Commons, MD sent at 07/10/2019 12:36 PM EST ----- No significant blockages.

## 2019-07-13 NOTE — Telephone Encounter (Signed)
Laurine Blazer, [place]  [id number] AM EST    Patient notified. Copy to pmd. Follow up scheduled for April with Dr. Bronson Ing.

## 2019-07-23 DIAGNOSIS — E1165 Type 2 diabetes mellitus with hyperglycemia: Secondary | ICD-10-CM | POA: Diagnosis not present

## 2019-07-23 DIAGNOSIS — I10 Essential (primary) hypertension: Secondary | ICD-10-CM | POA: Diagnosis not present

## 2019-07-23 DIAGNOSIS — I4891 Unspecified atrial fibrillation: Secondary | ICD-10-CM | POA: Diagnosis not present

## 2019-07-23 DIAGNOSIS — Z299 Encounter for prophylactic measures, unspecified: Secondary | ICD-10-CM | POA: Diagnosis not present

## 2019-07-23 DIAGNOSIS — E1142 Type 2 diabetes mellitus with diabetic polyneuropathy: Secondary | ICD-10-CM | POA: Diagnosis not present

## 2019-07-28 DIAGNOSIS — E1159 Type 2 diabetes mellitus with other circulatory complications: Secondary | ICD-10-CM | POA: Diagnosis not present

## 2019-07-28 DIAGNOSIS — E1140 Type 2 diabetes mellitus with diabetic neuropathy, unspecified: Secondary | ICD-10-CM | POA: Diagnosis not present

## 2019-08-13 DIAGNOSIS — Z299 Encounter for prophylactic measures, unspecified: Secondary | ICD-10-CM | POA: Diagnosis not present

## 2019-08-13 DIAGNOSIS — D6869 Other thrombophilia: Secondary | ICD-10-CM | POA: Diagnosis not present

## 2019-08-13 DIAGNOSIS — E1165 Type 2 diabetes mellitus with hyperglycemia: Secondary | ICD-10-CM | POA: Diagnosis not present

## 2019-08-13 DIAGNOSIS — I10 Essential (primary) hypertension: Secondary | ICD-10-CM | POA: Diagnosis not present

## 2019-08-31 DIAGNOSIS — H524 Presbyopia: Secondary | ICD-10-CM | POA: Diagnosis not present

## 2019-09-01 DIAGNOSIS — Z96651 Presence of right artificial knee joint: Secondary | ICD-10-CM | POA: Diagnosis not present

## 2019-09-01 DIAGNOSIS — Z471 Aftercare following joint replacement surgery: Secondary | ICD-10-CM | POA: Diagnosis not present

## 2019-09-02 ENCOUNTER — Telehealth: Payer: Self-pay

## 2019-09-02 NOTE — Telephone Encounter (Signed)
Pt was seen at [facility] yesterday and was prescribed a Naproxen. She was advised not to take it with her Xarelto. She asks is it safe for her to stop taking her Xarelto for the next 30 days or can she take the Naproxen short term along with Xarelto. Please advise.

## 2019-09-02 NOTE — Telephone Encounter (Signed)
I would not hold Xarelto for 30 days as that increases her thromboembolic risk.  There is certainly an increased bleeding risk when taking NSAIDs with Xarelto.  I think she could take it on a short-term basis periodically but would avoid taking chronically.

## 2019-09-02 NOTE — Telephone Encounter (Signed)
Pt has questions [id number] from another Physician  Please call [phone]  Thanks renee

## 2019-09-02 NOTE — Telephone Encounter (Signed)
Pt made aware of recommendation. She voiced understanding.

## 2019-09-03 DIAGNOSIS — Z96651 Presence of right artificial knee joint: Secondary | ICD-10-CM | POA: Diagnosis not present

## 2019-09-10 DIAGNOSIS — E1165 Type 2 diabetes mellitus with hyperglycemia: Secondary | ICD-10-CM | POA: Diagnosis not present

## 2019-09-10 DIAGNOSIS — M25561 Pain in right knee: Secondary | ICD-10-CM | POA: Diagnosis not present

## 2019-09-10 DIAGNOSIS — N182 Chronic kidney disease, stage 2 (mild): Secondary | ICD-10-CM | POA: Diagnosis not present

## 2019-09-10 DIAGNOSIS — Z9114 Patient's other noncompliance with medication regimen: Secondary | ICD-10-CM | POA: Diagnosis not present

## 2019-09-10 DIAGNOSIS — Z299 Encounter for prophylactic measures, unspecified: Secondary | ICD-10-CM | POA: Diagnosis not present

## 2019-09-10 DIAGNOSIS — I10 Essential (primary) hypertension: Secondary | ICD-10-CM | POA: Diagnosis not present

## 2019-09-22 DIAGNOSIS — Z471 Aftercare following joint replacement surgery: Secondary | ICD-10-CM | POA: Diagnosis not present

## 2019-09-22 DIAGNOSIS — Z96651 Presence of right artificial knee joint: Secondary | ICD-10-CM | POA: Diagnosis not present

## 2019-09-23 DIAGNOSIS — Z96651 Presence of right artificial knee joint: Secondary | ICD-10-CM | POA: Diagnosis not present

## 2019-09-24 ENCOUNTER — Telehealth: Payer: Medicare Other | Admitting: Cardiovascular Disease

## 2019-09-24 DIAGNOSIS — Z299 Encounter for prophylactic measures, unspecified: Secondary | ICD-10-CM | POA: Diagnosis not present

## 2019-09-24 DIAGNOSIS — T7840XA Allergy, unspecified, initial encounter: Secondary | ICD-10-CM | POA: Diagnosis not present

## 2019-09-24 DIAGNOSIS — I10 Essential (primary) hypertension: Secondary | ICD-10-CM | POA: Diagnosis not present

## 2019-09-24 DIAGNOSIS — J320 Chronic maxillary sinusitis: Secondary | ICD-10-CM | POA: Diagnosis not present

## 2019-10-06 DIAGNOSIS — I10 Essential (primary) hypertension: Secondary | ICD-10-CM | POA: Diagnosis not present

## 2019-10-06 DIAGNOSIS — E1165 Type 2 diabetes mellitus with hyperglycemia: Secondary | ICD-10-CM | POA: Diagnosis not present

## 2019-10-06 DIAGNOSIS — I4891 Unspecified atrial fibrillation: Secondary | ICD-10-CM | POA: Diagnosis not present

## 2019-10-06 DIAGNOSIS — Z299 Encounter for prophylactic measures, unspecified: Secondary | ICD-10-CM | POA: Diagnosis not present

## 2019-10-06 DIAGNOSIS — E1142 Type 2 diabetes mellitus with diabetic polyneuropathy: Secondary | ICD-10-CM | POA: Diagnosis not present

## 2019-10-12 ENCOUNTER — Telehealth (INDEPENDENT_AMBULATORY_CARE_PROVIDER_SITE_OTHER): Payer: Medicare Other | Admitting: Cardiovascular Disease

## 2019-10-12 ENCOUNTER — Encounter: Payer: Self-pay | Admitting: Cardiovascular Disease

## 2019-10-12 VITALS — Ht 62.5 in | Wt 143.0 lb

## 2019-10-12 DIAGNOSIS — R072 Precordial pain: Secondary | ICD-10-CM | POA: Diagnosis not present

## 2019-10-12 DIAGNOSIS — I480 Paroxysmal atrial fibrillation: Secondary | ICD-10-CM

## 2019-10-12 NOTE — Patient Instructions (Signed)
Medication Instructions:  Continue all current medications.   Labwork: none  Testing/Procedures: none  Follow-Up: 6 months   Any Other Special Instructions Will Be Listed Below (If Applicable).   If you need a refill on your cardiac medications before your next appointment, please call your pharmacy.  
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
## 2019-10-15 ENCOUNTER — Telehealth: Payer: Medicare Other | Admitting: Cardiovascular Disease

## 2019-10-16 DIAGNOSIS — I10 Essential (primary) hypertension: Secondary | ICD-10-CM | POA: Diagnosis not present

## 2019-10-21 DIAGNOSIS — E1142 Type 2 diabetes mellitus with diabetic polyneuropathy: Secondary | ICD-10-CM | POA: Diagnosis not present

## 2019-10-21 DIAGNOSIS — I10 Essential (primary) hypertension: Secondary | ICD-10-CM | POA: Diagnosis not present

## 2019-10-21 DIAGNOSIS — E1165 Type 2 diabetes mellitus with hyperglycemia: Secondary | ICD-10-CM | POA: Diagnosis not present

## 2019-10-21 DIAGNOSIS — Z299 Encounter for prophylactic measures, unspecified: Secondary | ICD-10-CM | POA: Diagnosis not present

## 2019-10-21 DIAGNOSIS — I4891 Unspecified atrial fibrillation: Secondary | ICD-10-CM | POA: Diagnosis not present

## 2019-11-11 DIAGNOSIS — E1165 Type 2 diabetes mellitus with hyperglycemia: Secondary | ICD-10-CM | POA: Diagnosis not present

## 2019-11-11 DIAGNOSIS — E1142 Type 2 diabetes mellitus with diabetic polyneuropathy: Secondary | ICD-10-CM | POA: Diagnosis not present

## 2019-11-11 DIAGNOSIS — I10 Essential (primary) hypertension: Secondary | ICD-10-CM | POA: Diagnosis not present

## 2019-11-11 DIAGNOSIS — I4891 Unspecified atrial fibrillation: Secondary | ICD-10-CM | POA: Diagnosis not present

## 2019-11-11 DIAGNOSIS — Z299 Encounter for prophylactic measures, unspecified: Secondary | ICD-10-CM | POA: Diagnosis not present

## 2019-11-15 DIAGNOSIS — E1165 Type 2 diabetes mellitus with hyperglycemia: Secondary | ICD-10-CM | POA: Diagnosis not present

## 2019-12-16 DIAGNOSIS — I10 Essential (primary) hypertension: Secondary | ICD-10-CM | POA: Diagnosis not present

## 2019-12-16 DIAGNOSIS — E785 Hyperlipidemia, unspecified: Secondary | ICD-10-CM | POA: Diagnosis not present

## 2019-12-18 ENCOUNTER — Encounter: Payer: Self-pay | Admitting: Podiatry

## 2019-12-18 ENCOUNTER — Ambulatory Visit: Payer: Medicare Other | Admitting: Podiatry

## 2019-12-18 ENCOUNTER — Other Ambulatory Visit: Payer: Self-pay

## 2019-12-18 DIAGNOSIS — M7741 Metatarsalgia, right foot: Secondary | ICD-10-CM | POA: Diagnosis not present

## 2019-12-18 DIAGNOSIS — B070 Plantar wart: Secondary | ICD-10-CM

## 2019-12-18 DIAGNOSIS — M7742 Metatarsalgia, left foot: Secondary | ICD-10-CM

## 2019-12-18 NOTE — Progress Notes (Signed)
  Subjective:  Patient ID: Katrina Castro, female    DOB: 03/12/1946,  MRN: [id number]  Chief Complaint  Patient presents with  . Callouses    i have a spot on the back of the left big toe  . Foot Problem    i am a diabetic and my feet do burn    74 y.o. female presents with the above complaint. History confirmed with patient.  Diabetes well controlled with an A1c of 6.6.  She notes feet that burn on the bottom she is standing up for long period of time.  She still works as a Writer for cancer patients.  She also has a callus on the underside of her left big toe.  Objective:  Physical Exam: warm, good capillary refill, no trophic changes or ulcerative lesions, normal DP and PT pulses and normal sensory exam. Left Foot: Verruca present plantar left hallux with pinpoint bleeding on debridement of the hyperkeratotic area.  Atrophy of the plantar metatarsal fat pad  Right Foot: Atrophy of the plantar metatarsal fat pad   No images are attached to the encounter.  Radiographs: Not indicated Assessment:   1. Plantar verruca   2. Metatarsalgia of both feet      Plan:  Patient was evaluated and treated and all questions answered.  Verruca plantaris and Metatarsalgia -Educated on etiology -Lesion debrided and destroyed.  See below procedure note -She is significant metatarsal fat pad atrophy and believe is causing majority of her metatarsal pain and burning after standing while working.  We discussed a number of shoe gear changes including comfortable well-padded shoes that have good support.  Metatarsal pads were dispensed and measured her how to use these today.  We also discussed over-the-counter orthotics as bills and metatarsal pad and she will look for these. -She will return in 4 weeks  Procedure: Destruction of Lesion Location: Left plantar hallux Anesthesia: Ethyl Chloride Spray Instrumentation: 15 blade. Technique: Debridement of lesion to petechial bleeding.  Aperture pad applied around lesion. Small amount of canthrone applied to the base of the lesion. Dressing: Dry, sterile, compression dressing. Disposition: Patient tolerated procedure well. Advised to leave dressing on for 6-8 hours. Thereafter patient to wash the area with soap and water and applied band-aid.   Return in about 4 weeks (around 01/15/2020).

## 2019-12-18 NOTE — Patient Instructions (Addendum)
Metatarsal pads can be bough on [place] for comfortable shoes such as Vionic Plantar Warts Plantar warts are small growths on the bottom of the foot (sole). Warts are caused by a type of germ (virus). Most warts are not painful, and they usually do not cause problems. Sometimes, plantar warts can cause pain when you walk. Warts often go away on their own in time. They can also spread to other areas of the body. Treatments may be done if needed. What are the causes?  Plantar warts are caused by a germ that is called human papillomavirus (HPV). ? Walking barefoot can cause exposure to the germ, especially if your feet are wet. ? Warts happen when HPV attacks a break in the skin of the foot. What increases the risk?  Being between 37-56 years of age.  Using public showers or locker rooms.  Having a weakened body defense system (immune system). What are the signs or symptoms?   Flat or slightly raised growths that have a rough surface and look like a callus.  Pain when you use your foot to support your body weight. How is this treated? In many cases, warts do not need treatment. Without treatment, they often go away with time. If treatment is needed or wanted, options may include:  Applying medicated solutions, creams, or patches to the wart. These make the skin soft so that layers will slowly shed away.  Freezing the wart with liquid nitrogen (cryotherapy).  Burning the wart with: ? Laser treatment. ? An electrified probe (electrocautery).  Injecting a medicine (Candida antigen) into the wart to help the body's defense system fight off the wart.  Having surgery to remove the wart.  Putting duct tape over the top of the wart (occlusion). You will leave the tape in place for as long as told by your doctor. Then you will replace it with a new strip of tape. This is done until the wart goes away. Repeat treatment may be needed if you choose to remove warts. Warts sometimes go  away and come back again. Follow these instructions at home: General instructions  Apply creams or solutions only as told by your doctor. Follow these steps if your doctor tells you to do so: ? Soak your foot in warm water. ? Remove the top layer of softened skin before you apply the medicine. You can use a pumice stone to remove the skin. ? After you apply the medicine, put a bandage over the area of the wart. ? Repeat the process every day or as told by your doctor.  Do not scratch or pick at a wart.  Wash your hands after you touch a wart.  If a wart hurts, try covering it with a bandage that has a hole in the middle.  Keep all follow-up visits as told by your doctor. This is important. How is this prevented?   Wear shoes and socks. Change your socks every day.  Keep your feet clean and dry.  Check your feet often.  Do not walk barefoot in: ? Shared locker rooms. ? Shower areas. ? Swimming pools.  Avoid direct contact with warts on other people. Contact a doctor if:  Your warts do not improve after treatment.  You have redness, swelling, or pain at the site of a wart.  You have bleeding from a wart, and the bleeding does not stop when you put light pressure on the wart.  You have diabetes and you get a wart. Summary  Warts are small growths on the skin.  When warts happen on the bottom of the foot (sole), they are called plantar warts.  In many cases, warts do not need treatment.  Apply creams or solutions only as told by your doctor.  Do not scratch or pick at a wart. Wash your hands after you touch a wart. This information is not intended to replace advice given to you by your health care provider. Make sure you discuss any questions you have with your health care provider. Document Revised: 03/13/2018 Document Reviewed: 03/13/2018 Elsevier Patient Education  2020 [employer].

## 2020-01-14 ENCOUNTER — Ambulatory Visit: Payer: Medicare Other | Admitting: Podiatry

## 2020-01-27 DIAGNOSIS — Z299 Encounter for prophylactic measures, unspecified: Secondary | ICD-10-CM | POA: Diagnosis not present

## 2020-01-27 DIAGNOSIS — I10 Essential (primary) hypertension: Secondary | ICD-10-CM | POA: Diagnosis not present

## 2020-01-27 DIAGNOSIS — E1142 Type 2 diabetes mellitus with diabetic polyneuropathy: Secondary | ICD-10-CM | POA: Diagnosis not present

## 2020-01-27 DIAGNOSIS — I4891 Unspecified atrial fibrillation: Secondary | ICD-10-CM | POA: Diagnosis not present

## 2020-01-27 DIAGNOSIS — E1165 Type 2 diabetes mellitus with hyperglycemia: Secondary | ICD-10-CM | POA: Diagnosis not present

## 2020-01-27 DIAGNOSIS — Z789 Other specified health status: Secondary | ICD-10-CM | POA: Diagnosis not present

## 2020-01-28 ENCOUNTER — Encounter: Payer: Self-pay | Admitting: Podiatry

## 2020-01-28 ENCOUNTER — Ambulatory Visit (INDEPENDENT_AMBULATORY_CARE_PROVIDER_SITE_OTHER): Payer: Medicare Other

## 2020-01-28 ENCOUNTER — Ambulatory Visit: Payer: Self-pay

## 2020-01-28 ENCOUNTER — Ambulatory Visit: Payer: Medicare Other | Admitting: Podiatry

## 2020-01-28 ENCOUNTER — Other Ambulatory Visit: Payer: Self-pay

## 2020-01-28 DIAGNOSIS — M2032 Hallux varus (acquired), left foot: Secondary | ICD-10-CM

## 2020-01-28 DIAGNOSIS — L84 Corns and callosities: Secondary | ICD-10-CM | POA: Diagnosis not present

## 2020-01-28 DIAGNOSIS — E1140 Type 2 diabetes mellitus with diabetic neuropathy, unspecified: Secondary | ICD-10-CM | POA: Diagnosis not present

## 2020-01-28 DIAGNOSIS — M2042 Other hammer toe(s) (acquired), left foot: Secondary | ICD-10-CM

## 2020-01-28 DIAGNOSIS — M79671 Pain in right foot: Secondary | ICD-10-CM | POA: Diagnosis not present

## 2020-01-28 DIAGNOSIS — M79672 Pain in left foot: Secondary | ICD-10-CM

## 2020-01-28 NOTE — Progress Notes (Signed)
  Subjective:  Patient ID: Katrina Castro, female    DOB: 03/22/1946,  MRN: [id number]  Chief Complaint  Patient presents with  . Bunions    Follow-up. Left foot. Pt stated, "It's still sore and burning. 7/10".    74 y.o. female presents with the above complaint. History confirmed with patient.  Last visit we applied a medication to treat hyperkeratotic lesion on the left hallux.  She says this is about the same, did not really blister or have any issue, is still painful for her.  She also wonders if there is anything to do to correct the position of her large and lesser toes, she had surgery on both these many years ago. Objective:  Physical Exam: warm, good capillary refill, no trophic changes or ulcerative lesions, normal DP and PT pulses.  She does have diffuse loss of protective sensation bilaterally Left Foot: Hallux varus and medial deviation of the digits.  Healed surgical scars on the medial foot and dorsal second and third toes.  Atrophy of the plantar metatarsal fat pad  Right Foot: Atrophy of the plantar metatarsal fat pad   No images are attached to the encounter.  Radiographs: Left foot hallux varus and medial deviation of the second and third MTPJ's with digital IPJ fusions of the second and third toes Assessment:   1. Acquired hallux varus of left foot   2. Hammertoe of left foot   3. Callus of foot   4. Pain in both feet   5. Controlled type 2 diabetes with neuropathy ([place])      Plan:  Patient was evaluated and treated and all questions answered.   -Today I discussed with her that most of her pain is from this hyperkeratotic lesion, did not respond well to keratolytic treatment.  This appears to be secondary to the varus rotation hallux varus positioning of her left great toe.  Seems to be consistent with the head of the proximal phalanx of the hallux.  -I advised her that we should find a nonsurgical solution if possible.  I recommended that she have supportive  extra-depth diabetic shoes with multidensity accommodative inserts.  I will discuss offload the area and alleviate pressure and pain.  -Physical assessment we will discuss surgery.  I would like to see what her new A1c is, she is due to have a check next month.  She does have neuropathy however does have palpable pulses and does not smoke.  She is quite active.  Think the best surgical solution for her would be relocation of the second and third MTPJ's with Weil osteotomies and collateral ligament stabilization, possible arthroplasties or arthrodesis of the PIPJ's, and arthrodesis of the first MTP joint with or without an arthroplasty or sesamoidectomy of the hallux IPJ.  Will discuss this further on her next visit  Return in about 2 months (around 03/29/2020).

## 2020-02-02 DIAGNOSIS — Z299 Encounter for prophylactic measures, unspecified: Secondary | ICD-10-CM | POA: Diagnosis not present

## 2020-02-02 DIAGNOSIS — Z1231 Encounter for screening mammogram for malignant neoplasm of breast: Secondary | ICD-10-CM | POA: Diagnosis not present

## 2020-02-02 DIAGNOSIS — I4891 Unspecified atrial fibrillation: Secondary | ICD-10-CM | POA: Diagnosis not present

## 2020-02-02 DIAGNOSIS — M7918 Myalgia, other site: Secondary | ICD-10-CM | POA: Diagnosis not present

## 2020-02-02 DIAGNOSIS — I10 Essential (primary) hypertension: Secondary | ICD-10-CM | POA: Diagnosis not present

## 2020-02-09 ENCOUNTER — Telehealth: Payer: Self-pay | Admitting: Podiatry

## 2020-02-09 NOTE — Telephone Encounter (Signed)
Yes that sounds like neuropathy. There are topical neuropathy creams I can prescribe via [place] (likely some out of pocket cost, usually $30-40) or she could try an oral medication like gabapentin, but I'd advise her to discuss that with her PCP to manage it and prescribe.

## 2020-02-09 NOTE — Telephone Encounter (Signed)
Pt called stating her feet are hurting. Her diabetic shoe fitting was rescheduled for 9/24 with [facility]. She thinks that's too long to go without diabetic shoes. Please advise.

## 2020-02-09 NOTE — Telephone Encounter (Signed)
I certainly would prefer to have them sooner as well but I know we are behind on shoe fittings. If he is unable to see and process them sooner, I can send her to [facility] if she'd prefer.

## 2020-02-09 NOTE — Telephone Encounter (Signed)
Pt had left message today at 216 and I called to make sure she was taken care of and it was.   She then asked if I could send you a message about a problem she is having at night. She said it feels like something is crawling on her feet and legs at night. What could cause this. I did ask if she has diabetic neuropathy and she did. She then also complemented the office stating we are just the sweetest people and I said thank you.

## 2020-02-10 ENCOUNTER — Other Ambulatory Visit: Payer: Medicare Other | Admitting: Orthotics

## 2020-02-16 DIAGNOSIS — I10 Essential (primary) hypertension: Secondary | ICD-10-CM | POA: Diagnosis not present

## 2020-02-16 DIAGNOSIS — E785 Hyperlipidemia, unspecified: Secondary | ICD-10-CM | POA: Diagnosis not present

## 2020-02-18 DIAGNOSIS — E1142 Type 2 diabetes mellitus with diabetic polyneuropathy: Secondary | ICD-10-CM | POA: Diagnosis not present

## 2020-02-18 DIAGNOSIS — N390 Urinary tract infection, site not specified: Secondary | ICD-10-CM | POA: Diagnosis not present

## 2020-02-18 DIAGNOSIS — Z299 Encounter for prophylactic measures, unspecified: Secondary | ICD-10-CM | POA: Diagnosis not present

## 2020-02-18 DIAGNOSIS — I10 Essential (primary) hypertension: Secondary | ICD-10-CM | POA: Diagnosis not present

## 2020-02-18 DIAGNOSIS — E1165 Type 2 diabetes mellitus with hyperglycemia: Secondary | ICD-10-CM | POA: Diagnosis not present

## 2020-03-10 ENCOUNTER — Telehealth: Payer: Self-pay | Admitting: Podiatry

## 2020-03-10 NOTE — Telephone Encounter (Signed)
Pt left message asking for a call back because she thinks she has an appt tomorrow.  I returned call and confirmed her appt tomorrow @ 130.

## 2020-03-11 ENCOUNTER — Other Ambulatory Visit: Payer: Self-pay

## 2020-03-11 ENCOUNTER — Other Ambulatory Visit: Payer: Medicare Other | Admitting: Orthotics

## 2020-03-14 NOTE — Progress Notes (Signed)
Cardiology Office Note  Date: 03/15/2020   ID: Katrina Castro, DOB 1946-01-29, MRN [id number]  PCP:  Glenda Chroman, MD  Cardiologist:  No primary care provider on file. Electrophysiologist:  None   Chief Complaint:.  PAF  History of Present Illness: Katrina Castro is a 74 y.o. female with a history of PAF, chest pain, shortness of breath, esophageal dysmotility, DM 2, HTN.  Previous stress test on 07/11/2019 low risk with calculated EF 62%.   Last encounter with Dr. Bronson Ing 10/12/2019 via telemedicine visit.  Had some chest soreness in the upper left chest.  Stated was constant.  Her atrial fibrillation was symptomatically stable on metoprolol and she was continuing Xarelto 20 mg daily for anticoagulation.  No further cardiac testing was indicated at that time.  She is here for 89-month follow-up today she continues to complain of left-sided chest pain associated with exertion with radiating to the left shoulder.  States she sometimes has associated sweatiness.  She denies any aggravating or alleviating factors.  She does not have a history of esophageal dysmotility with a "small esophagus" per her statement for which she has had Botox injections and sees [facility] gastroenterology for these issues.  Recent stress test was considered low risk in January as noted above.  Patient states she does not always take her Xarelto nor her metoprolol twice a day as directed.  Advised her the medication is not effective if not taken as directed.  States she does not like to take medications.  States she does have some gas symptoms and sometimes pain is relieved with belching.  States he has been noticing some increased dyspnea on exertion when walking her dog.  States this is not normal for her.  She denies any CVA or TIA like symptoms, orthostatic symptoms, PND, orthopnea, lower extremity edema, claudication-like symptoms, DVT or PE-like symptoms.  No CVA or TIA-like symptoms, or bleeding.  Past  Medical History:  Diagnosis Date  . Arthritis   . Concussion    2015  . Depression   . Diabetes ([place])    type 2  . Dysphagia   . Dysrhythmia    afib  . GERD (gastroesophageal reflux disease)   . Headache   . History of bronchitis   . Hypertension   . Seasonal allergies   . Toenail fungus     Past Surgical History:  Procedure Laterality Date  . APPENDECTOMY    . BACK SURGERY     x2  . BOTOX INJECTION N/A 06/06/2016   Procedure: BOTOX INJECTION;  Surgeon: Rogene Houston, MD;  [facility];  Service: Endoscopy;  Laterality: N/A;  . CHOLECYSTECTOMY    . COLONOSCOPY N/A 11/05/2012   Procedure: COLONOSCOPY;  Surgeon: Rogene Houston, MD;  [facility];  Service: Endoscopy;  Laterality: N/A;  1030  . COLONOSCOPY WITH PROPOFOL N/A 05/01/2019   Procedure: COLONOSCOPY WITH PROPOFOL;  Surgeon: Rogene Houston, MD;  [facility];  Service: Endoscopy;  Laterality: N/A;  11:20am  . ESOPHAGEAL DILATION N/A 07/06/2015   Procedure: ESOPHAGEAL DILATION;  Surgeon: Rogene Houston, MD;  [facility];  Service: Endoscopy;  Laterality: N/A;  . ESOPHAGEAL DILATION N/A 06/06/2016   Procedure: ESOPHAGEAL DILATION;  Surgeon: Rogene Houston, MD;  [facility];  Service: Endoscopy;  Laterality: N/A;  . ESOPHAGOGASTRODUODENOSCOPY N/A 02/12/2014   Procedure: ESOPHAGOGASTRODUODENOSCOPY (EGD);  Surgeon: Rogene Houston, MD;  [facility];  Service: Endoscopy;  Laterality: N/A;  150  . ESOPHAGOGASTRODUODENOSCOPY N/A 07/06/2015   Procedure: ESOPHAGOGASTRODUODENOSCOPY (EGD);  Surgeon: Rogene Houston, MD;  [facility];  Service: Endoscopy;  Laterality: N/A;  1:25 - moved to 1/18 @ 10:30 - Ann to notify pt  . ESOPHAGOGASTRODUODENOSCOPY (EGD) WITH ESOPHAGEAL DILATION N/A 07/25/2012   Procedure: ESOPHAGOGASTRODUODENOSCOPY (EGD) WITH ESOPHAGEAL DILATION;  Surgeon: Rogene Houston, MD;  [facility];  Service: Endoscopy;   Laterality: N/A;  325-rescheduled to [place] notified pt  . ESOPHAGOGASTRODUODENOSCOPY (EGD) WITH PROPOFOL N/A 06/06/2016   Procedure: ESOPHAGOGASTRODUODENOSCOPY (EGD) WITH PROPOFOL;  Surgeon: Rogene Houston, MD;  [facility];  Service: Endoscopy;  Laterality: N/A;  . EYE SURGERY     cataracts removed  . Foot surgeries Bilateral    hammer toes  . MALONEY DILATION N/A 02/12/2014   Procedure: Venia Minks DILATION;  Surgeon: Rogene Houston, MD;  [facility];  Service: Endoscopy;  Laterality: N/A;  . POLYPECTOMY  05/01/2019   Procedure: POLYPECTOMY;  Surgeon: Rogene Houston, MD;  [facility];  Service: Endoscopy;;  colon   . Right knee arthroscopy     x2  . TONSILLECTOMY    . TOTAL ABDOMINAL HYSTERECTOMY    . TOTAL KNEE ARTHROPLASTY Right 04/03/2017   Procedure: RIGHT TOTAL KNEE ARTHROPLASTY;  Surgeon: Latanya Maudlin, MD;  [facility];  Service: Orthopedics;  Laterality: Right;    Current Outpatient Medications  Medication Sig Dispense Refill  . acyclovir (ZOVIRAX) 400 MG tablet Take 400 mg by mouth daily.    Marland Kitchen ALPRAZolam (XANAX) 0.5 MG tablet Take 0.25 mg by mouth at bedtime as needed.    . cetirizine (ZYRTEC) 10 MG tablet Take 10 mg by mouth daily.    Marland Kitchen docusate sodium (COLACE) 100 MG capsule Take 2 capsules (200 mg total) by mouth at bedtime.    Marland Kitchen glimepiride (AMARYL) 2 MG tablet Take 2 mg by mouth as needed.    Marland Kitchen levocetirizine (XYZAL) 5 MG tablet Take 5 mg by mouth every evening.    . metFORMIN (GLUCOPHAGE) 500 MG tablet Take 500 mg by mouth 2 (two) times daily with a meal.     . metoprolol (LOPRESSOR) 50 MG tablet Take 50 mg by mouth 2 (two) times daily.     Marland Kitchen oxybutynin (DITROPAN-XL) 10 MG 24 hr tablet Take 10 mg by mouth daily.    . pantoprazole (PROTONIX) 40 MG tablet Take 40 mg by mouth daily.    . rivaroxaban (XARELTO) 20 MG TABS tablet Take 20 mg by mouth every other day.     No current facility-administered medications for this  visit.   Allergies:  Levofloxacin, Cardizem [diltiazem], Lipitor [atorvastatin], Sulfa antibiotics, and Diazepam   Social History: The patient  reports that she has never smoked. She has never used smokeless tobacco. She reports that she does not drink alcohol and does not use drugs.   Family History: The patient's family history includes Diabetes in her mother and sister; Stroke in her father.   ROS:  Please see the history of present illness. Otherwise, complete review of systems is positive for none.  All other systems are reviewed and negative.   Physical Exam: VS:  BP (!) 142/64   Pulse 67   Ht 5\' 3"  (1.6 m)   Wt 146 lb (66.2 kg)   SpO2 98%   BMI 25.86 kg/m , BMI Body mass index is 25.86 kg/m.  Wt Readings from Last 3 Encounters:  03/15/20  146 lb (66.2 kg)  10/12/19 143 lb (64.9 kg)  06/30/19 147 lb (66.7 kg)    General: Patient appears comfortable at rest. Neck: Supple, no elevated JVP or carotid bruits, no thyromegaly. Lungs: Clear to auscultation, nonlabored breathing at rest. Cardiac: Regular rate and rhythm, no S3 or significant systolic murmur, no pericardial rub. Extremities: No pitting edema, distal pulses 2+. Skin: Warm and dry. Musculoskeletal: No kyphosis. Neuropsychiatric: Alert and oriented x3, affect grossly appropriate.  ECG:  EKG June 30, 2019 showed normal sinus rhythm with a rate of 60.  Recent Labwork: 04/29/2019: BUN 11; Creatinine, Ser 0.70; Potassium 3.8; Sodium 142  No results found for: CHOL, TRIG, HDL, CHOLHDL, VLDL, LDLCALC, LDLDIRECT  Other Studies Reviewed Today: Echocardiogram 05/18/16: Normal left ventricular systolic function, LVEF 77-82%, grade 1 diastolic dysfunction. The left atrium was normal in size.  Nuclear stress test 07/10/2019:   There was no ST segment deviation noted during stress.  Defect 1: There is a medium defect of moderate severity present in the mid inferoseptal, apical septal and apical inferior location. This  is likely due to soft tissue attenuation as regional wall motion appears grossly normal.  This is a low risk study.  Nuclear stress EF: 62%.     Assessment and Plan:  1. Chest pain, unspecified type   2. PAF (paroxysmal atrial fibrillation) ([place])   3. Shortness of breath    1. Chest pain, unspecified type Continues to complain of chest pain left-sided with radiation to the left shoulder associated with activity.  She states she goes dancing a lot and usually occurs during activity.  Describes it as a sharp pain lasting minutes at the time.  States sometimes she has some associated sweating but no nausea, vomiting, or dizziness.  Recently had a low risk stress test in January.  We will start low-dose isosorbide dinitrate 10 mg p.o. twice daily to see if this may relieve her chest pain symptoms.  2. PAF (paroxysmal atrial fibrillation) ([place]) Patient denies any palpitations.  States she has never had any palpitations that she is aware of.  She states she does not think she has atrial fibrillation.  He states this was first noticed when she was to have a procedure for Botox injection to her LES by Dr. Laural Golden.  States she is not taking her Xarelto as directed.  States she may take it once or twice a week.  Also admits that she is only taking the metoprolol once a day.  Advised her she needs to take the medication as directed.  Continue Xarelto 20 mg daily, metoprolol 50 mg p.o. twice daily.  3. Shortness of breath States she is noticing progressively increasing shortness of breath when performing such activities as dancing or walking her dog which has become progressively worse.  Please get an echocardiogram  Medication Adjustments/Labs and Tests Ordered: Current medicines are reviewed at length with the patient today.  Concerns regarding medicines are outlined above.   Disposition: Follow-up with Dr. Domenic Polite or APP 4 to 6 weeks Signed, Levell July, NP 03/15/2020 8:55 AM    [facility] at [place], [place],  [address] Phone: [phone]; Fax: [phone]

## 2020-03-15 ENCOUNTER — Encounter: Payer: Self-pay | Admitting: Family Medicine

## 2020-03-15 ENCOUNTER — Ambulatory Visit (INDEPENDENT_AMBULATORY_CARE_PROVIDER_SITE_OTHER): Payer: Medicare Other | Admitting: Family Medicine

## 2020-03-15 VITALS — BP 142/64 | HR 67 | Ht 63.0 in | Wt 146.0 lb

## 2020-03-15 DIAGNOSIS — I480 Paroxysmal atrial fibrillation: Secondary | ICD-10-CM

## 2020-03-15 DIAGNOSIS — R0602 Shortness of breath: Secondary | ICD-10-CM

## 2020-03-15 DIAGNOSIS — R079 Chest pain, unspecified: Secondary | ICD-10-CM | POA: Diagnosis not present

## 2020-03-15 MED ORDER — ISOSORBIDE DINITRATE 10 MG PO TABS: 10.0000 mg | ORAL_TABLET | Freq: Two times a day (BID) | ORAL | 6 refills | Status: DC

## 2020-03-15 NOTE — Patient Instructions (Signed)
Medication Instructions:   Begin Isosorbide DN twice a day.  Continue all other current medications.  Labwork: none  Testing/Procedures:  Your physician has requested that you have an echocardiogram. Echocardiography is a painless test that uses sound waves to create images of your heart. It provides your doctor with information about the size and shape of your heart and how well your heart's chambers and valves are working. This procedure takes approximately one hour. There are no restrictions for this procedure.  Office will contact with results via phone or letter.    Follow-Up: 4 - 6 weeks   Any Other Special Instructions Will Be Listed Below (If Applicable).  If you need a refill on your cardiac medications before your next appointment, please call your pharmacy.

## 2020-03-17 ENCOUNTER — Ambulatory Visit (HOSPITAL_COMMUNITY)
Admission: RE | Admit: 2020-03-17 | Discharge: 2020-03-17 | Disposition: A | Discharge status: Home / Self Care | Payer: Medicare Other | Source: Ambulatory Visit | Attending: Family Medicine | Admitting: Family Medicine

## 2020-03-17 ENCOUNTER — Telehealth: Payer: Self-pay | Admitting: Cardiology

## 2020-03-17 ENCOUNTER — Other Ambulatory Visit: Payer: Self-pay

## 2020-03-17 DIAGNOSIS — R0602 Shortness of breath: Secondary | ICD-10-CM | POA: Diagnosis not present

## 2020-03-17 LAB — ECHOCARDIOGRAM COMPLETE
Area-P 1/2: 3.95 cm2
S' Lateral: 2.23 cm

## 2020-03-17 NOTE — Telephone Encounter (Signed)
Patient notified that she is to continue taking all medicines as prescribed.

## 2020-03-17 NOTE — Telephone Encounter (Signed)
New message    ? Patient called she had her echo done today , she wants to know if she still needs to take the Isosorbide

## 2020-03-17 NOTE — Progress Notes (Signed)
  Echocardiogram 2D Echocardiogram has been performed.  Katrina Castro 03/17/2020, 1:22 PM

## 2020-03-18 ENCOUNTER — Telehealth: Payer: Self-pay | Admitting: Family Medicine

## 2020-03-18 NOTE — Telephone Encounter (Signed)
Results of echo given to patient. She requested copy of report,mailed to patient.

## 2020-03-18 NOTE — Telephone Encounter (Signed)
Please result echo report

## 2020-03-18 NOTE — Telephone Encounter (Signed)
Just finished.  I sent it to you in addition to [place] in case you want to call her

## 2020-03-18 NOTE — Telephone Encounter (Signed)
Pt requesting echo results   Please call [phone]    Thanks renee

## 2020-03-21 ENCOUNTER — Telehealth: Payer: Self-pay | Admitting: Family Medicine

## 2020-03-21 NOTE — Telephone Encounter (Signed)
Noted  

## 2020-03-21 NOTE — Telephone Encounter (Signed)
New message     Patient left message on voicemail - she would like to pick up a copy of the results for  Her Echo - please print and she will pick up in [facility]

## 2020-03-23 ENCOUNTER — Telehealth: Payer: Self-pay | Admitting: Podiatry

## 2020-03-23 NOTE — Telephone Encounter (Signed)
Pt left message on 10.4 @ 904 checking on time of appt on 10.14 and status of shoes ordered.   I returned call and gave pt appt time on 10.14 and explained we have not gotten paperwork back from pcp and she stated she has an appt with them tomorrow to discuss.

## 2020-03-24 ENCOUNTER — Other Ambulatory Visit: Payer: Medicare Other

## 2020-03-24 DIAGNOSIS — I4891 Unspecified atrial fibrillation: Secondary | ICD-10-CM | POA: Diagnosis not present

## 2020-03-24 DIAGNOSIS — Z299 Encounter for prophylactic measures, unspecified: Secondary | ICD-10-CM | POA: Diagnosis not present

## 2020-03-24 DIAGNOSIS — E1165 Type 2 diabetes mellitus with hyperglycemia: Secondary | ICD-10-CM | POA: Diagnosis not present

## 2020-03-24 DIAGNOSIS — E1142 Type 2 diabetes mellitus with diabetic polyneuropathy: Secondary | ICD-10-CM | POA: Diagnosis not present

## 2020-03-24 DIAGNOSIS — R079 Chest pain, unspecified: Secondary | ICD-10-CM | POA: Diagnosis not present

## 2020-03-24 DIAGNOSIS — I10 Essential (primary) hypertension: Secondary | ICD-10-CM | POA: Diagnosis not present

## 2020-03-28 ENCOUNTER — Telehealth: Payer: Self-pay | Admitting: Family Medicine

## 2020-03-28 NOTE — Telephone Encounter (Signed)
Error

## 2020-03-30 NOTE — Progress Notes (Signed)
Cardiology Office Note   Date:  03/31/2020   ID:  Katrina Castro, DOB 1946-04-06, MRN [id number]  PCP:  Glenda Chroman, MD  Cardiologist:   Dorris Carnes, MD   Pt presents for f/u of CP   Previously followed by Katrina Castro.   History of Present Illness: Katrina Castro is a 74 y.o. female with a history of PAF, chest pain, SOB, esophageal dysmotility, DM 2 and HTN   The pt was previousl seen by Katrina Castro.  Stress test in Jan 2021 showed mod severity defect in mid inferoseptal, apical septal, apical inferior wall that appeared to represent soft tissue attenuation  LVEF 62%    The pt was last seen by  A Quin in Sept 2021  She complained of L sided CP with exertionthat was sharp, radiating ot L shoulder /arm.   Pt found with walking dog.  Spells would last several minutes, accomp by diaphoresis.  But she was  still dancing at time   Started on isorbid 10 mg bid     Since seen she says she is still having spells   Has cut back on activty because activity exacerbates symptoms    Isorbid did not help much    Also complains of some chest pressure that may be related to food intake            Current Meds  Medication Sig  . acyclovir (ZOVIRAX) 400 MG tablet Take 400 mg by mouth daily.  Marland Kitchen ALPRAZolam (XANAX) 0.5 MG tablet Take 0.25 mg by mouth at bedtime as needed.  Marland Kitchen amoxicillin (AMOXIL) 500 MG capsule Take 1,000 mg by mouth 2 (two) times daily. Before dental visits  . cetirizine (ZYRTEC) 10 MG tablet Take 10 mg by mouth daily.  Marland Kitchen docusate sodium (COLACE) 100 MG capsule Take 2 capsules (200 mg total) by mouth at bedtime.  Marland Kitchen glimepiride (AMARYL) 2 MG tablet Take 2 mg by mouth as needed.  . isosorbide dinitrate (ISORDIL) 10 MG tablet Take 1 tablet (10 mg total) by mouth 2 (two) times daily.  Marland Kitchen levocetirizine (XYZAL) 5 MG tablet Take 5 mg by mouth every evening.  . metFORMIN (GLUCOPHAGE) 500 MG tablet Take 500 mg by mouth 2 (two) times daily with a meal.   . metoprolol (LOPRESSOR) 50 MG  tablet Take 50 mg by mouth 2 (two) times daily.   Marland Kitchen oxybutynin (DITROPAN-XL) 10 MG 24 hr tablet Take 10 mg by mouth daily.  . pantoprazole (PROTONIX) 40 MG tablet Take 40 mg by mouth daily.  . rivaroxaban (XARELTO) 20 MG TABS tablet Take 20 mg by mouth every other day.     Allergies:   Levofloxacin, Cardizem [diltiazem], Lipitor [atorvastatin], Sulfa antibiotics, and Diazepam   Past Medical History:  Diagnosis Date  . Arthritis   . Concussion    2015  . Depression   . Diabetes ([place])    type 2  . Dysphagia   . Dysrhythmia    afib  . GERD (gastroesophageal reflux disease)   . Headache   . History of bronchitis   . Hypertension   . Seasonal allergies   . Toenail fungus     Past Surgical History:  Procedure Laterality Date  . APPENDECTOMY    . BACK SURGERY     x2  . BOTOX INJECTION N/A 06/06/2016   Procedure: BOTOX INJECTION;  Surgeon: Rogene Houston, MD;  [facility];  Service: Endoscopy;  Laterality: N/A;  . CHOLECYSTECTOMY    .  COLONOSCOPY N/A 11/05/2012   Procedure: COLONOSCOPY;  Surgeon: Rogene Houston, MD;  [facility];  Service: Endoscopy;  Laterality: N/A;  1030  . COLONOSCOPY WITH PROPOFOL N/A 05/01/2019   Procedure: COLONOSCOPY WITH PROPOFOL;  Surgeon: Rogene Houston, MD;  [facility];  Service: Endoscopy;  Laterality: N/A;  11:20am  . ESOPHAGEAL DILATION N/A 07/06/2015   Procedure: ESOPHAGEAL DILATION;  Surgeon: Rogene Houston, MD;  [facility];  Service: Endoscopy;  Laterality: N/A;  . ESOPHAGEAL DILATION N/A 06/06/2016   Procedure: ESOPHAGEAL DILATION;  Surgeon: Rogene Houston, MD;  [facility];  Service: Endoscopy;  Laterality: N/A;  . ESOPHAGOGASTRODUODENOSCOPY N/A 02/12/2014   Procedure: ESOPHAGOGASTRODUODENOSCOPY (EGD);  Surgeon: Rogene Houston, MD;  [facility];  Service: Endoscopy;  Laterality: N/A;  150  . ESOPHAGOGASTRODUODENOSCOPY N/A 07/06/2015   Procedure:  ESOPHAGOGASTRODUODENOSCOPY (EGD);  Surgeon: Rogene Houston, MD;  [facility];  Service: Endoscopy;  Laterality: N/A;  1:25 - moved to 1/18 @ 10:30 - Ann to notify pt  . ESOPHAGOGASTRODUODENOSCOPY (EGD) WITH ESOPHAGEAL DILATION N/A 07/25/2012   Procedure: ESOPHAGOGASTRODUODENOSCOPY (EGD) WITH ESOPHAGEAL DILATION;  Surgeon: Rogene Houston, MD;  [facility];  Service: Endoscopy;  Laterality: N/A;  325-rescheduled to [place] notified pt  . ESOPHAGOGASTRODUODENOSCOPY (EGD) WITH PROPOFOL N/A 06/06/2016   Procedure: ESOPHAGOGASTRODUODENOSCOPY (EGD) WITH PROPOFOL;  Surgeon: Rogene Houston, MD;  [facility];  Service: Endoscopy;  Laterality: N/A;  . EYE SURGERY     cataracts removed  . Foot surgeries Bilateral    hammer toes  . MALONEY DILATION N/A 02/12/2014   Procedure: Venia Minks DILATION;  Surgeon: Rogene Houston, MD;  [facility];  Service: Endoscopy;  Laterality: N/A;  . POLYPECTOMY  05/01/2019   Procedure: POLYPECTOMY;  Surgeon: Rogene Houston, MD;  [facility];  Service: Endoscopy;;  colon   . Right knee arthroscopy     x2  . TONSILLECTOMY    . TOTAL ABDOMINAL HYSTERECTOMY    . TOTAL KNEE ARTHROPLASTY Right 04/03/2017   Procedure: RIGHT TOTAL KNEE ARTHROPLASTY;  Surgeon: Latanya Maudlin, MD;  [facility];  Service: Orthopedics;  Laterality: Right;     Social History:  The patient  reports that she has never smoked. She has never used smokeless tobacco. She reports that she does not drink alcohol and does not use drugs.   Family History:  The patient's family history includes Diabetes in her mother and sister; Stroke in her father.    ROS:  Please see the history of present illness. All other systems are reviewed and  Negative to the above problem except as noted.    PHYSICAL EXAM: VS:  BP 128/74   Pulse 65   Ht 5\' 3"  (1.6 m)   Wt 148 lb 3.2 oz (67.2 kg)   SpO2 94%   BMI 26.25 kg/m   GEN: Well nourished, well  developed, in no acute distress  HEENT: normal  Neck: no JVD or bruits   Cardiac: RRR; no murmur  No LE  edema  Respiratory:  clear to auscultation bilaterally, normal work of breathing GI: soft, nontender, nondistended, + BS  No hepatomegaly  MS: no deformity Moving all extremities   Skin: warm and dry, no rash Neuro:  Strength and sensation are intact Psych: euthymic mood, full affect   EKG:  EKG is ordered today.  SR 65 bpm     Lipid Panel No  results found for: CHOL, TRIG, HDL, CHOLHDL, VLDL, LDLCALC, LDLDIRECT    Wt Readings from Last 3 Encounters:  03/31/20 148 lb 3.2 oz (67.2 kg)  03/15/20 146 lb (66.2 kg)  10/12/19 143 lb (64.9 kg)      ASSESSMENT AND PLAN:  1  Chest pain   Atypical and typical features   Concerning even with myovue findings    I have discussed with pt   WOuld reocmm coronary CT angiograrm to define    Keep on same meds    2  Dypsnea.   Plan for cardia eval as noted     3  PAF   Continue Xarelto     4  HL  Check lipids today   Needs aggressive control with hx DM  F/U based on test results.    Current medicines are reviewed at length with the patient today.  The patient does not have concerns regarding medicines.  Signed, Dorris Carnes, MD  03/31/2020 9:30 PM    [facility], [place], [place]  [address] Phone: [phone]; Fax: [phone]

## 2020-03-31 ENCOUNTER — Ambulatory Visit: Payer: Medicare Other | Admitting: Podiatry

## 2020-03-31 ENCOUNTER — Other Ambulatory Visit: Payer: Self-pay

## 2020-03-31 ENCOUNTER — Ambulatory Visit (INDEPENDENT_AMBULATORY_CARE_PROVIDER_SITE_OTHER): Payer: Medicare Other | Admitting: Internal Medicine

## 2020-03-31 ENCOUNTER — Encounter: Payer: Self-pay | Admitting: Internal Medicine

## 2020-03-31 VITALS — BP 128/74 | HR 65 | Ht 63.0 in | Wt 148.2 lb

## 2020-03-31 DIAGNOSIS — R0602 Shortness of breath: Secondary | ICD-10-CM | POA: Diagnosis not present

## 2020-03-31 DIAGNOSIS — R072 Precordial pain: Secondary | ICD-10-CM

## 2020-03-31 DIAGNOSIS — I4891 Unspecified atrial fibrillation: Secondary | ICD-10-CM

## 2020-03-31 NOTE — Patient Instructions (Addendum)
Medication Instructions:  No changes *If you need a refill on your cardiac medications before your next appointment, please call your pharmacy*   Lab Work: Today: lipids, tsh, cbc, bmet  If you have labs (blood work) drawn today and your tests are completely normal, you will receive your results only by: Marland Kitchen MyChart Message (if you have MyChart) OR . A paper copy in the mail If you have any lab test that is abnormal or we need to change your treatment, we will call you to review the results.   Testing/Procedures: Coronary ct angiogram   Follow-Up: Follow up with your physician will depend on test results.   Other Instructions Your cardiac CT will be scheduled at one of the below locations:   [facility] [address] [place], [place] [address] [phone]  [facility] [address] [place], [place] [address] [phone]  If scheduled at [facility], please arrive at the [facility] main entrance of [facility] 30 minutes prior to test start time. Proceed to the [facility] Radiology Department (first floor) to check-in and test prep.  If scheduled at [facility], please arrive 15 mins early for check-in and test prep.  Please follow these instructions carefully (unless otherwise directed):   On the Night Before the Test: . Be sure to Drink plenty of water. . Do not consume any caffeinated/decaffeinated beverages or chocolate 12 hours prior to your test. .   Do not take any antihistamines 12 hours prior to your  test. (Zyrtec)  On the Day of the Test: . Drink plenty of water. Do not drink any water within one hour of the test. . Do not eat any food 4 hours prior to the test. . You may take your regular medications prior to the test.  . Take metoprolol (Lopressor) two hours prior to test. . FEMALES- please wear underwire-free bra if available       After  the Test: . Drink plenty of water. . After receiving IV contrast, you may experience a mild flushed feeling. This is normal. . On occasion, you may experience a mild rash up to 24 hours after the test. This is not dangerous. If this occurs, you can take Benadryl 25 mg and increase your fluid intake. . If you experience trouble breathing, this can be serious. If it is severe call 911 IMMEDIATELY. If it is mild, please call our office. . If you take any of these medications: Glipizide/Metformin, Avandament, Glucavance, please do not take 48 hours after completing test unless otherwise instructed.   Once we have confirmed authorization from your insurance company, we will call you to set up a date and time for your test. Based on how quickly your insurance processes prior authorizations requests, please allow up to 4 weeks to be contacted for scheduling your Cardiac CT appointment. Be advised that routine Cardiac CT appointments could be scheduled as many as 8 weeks after your provider has ordered it.  For non-scheduling related questions, please contact the cardiac imaging nurse navigator should you have any questions/concerns: Marchia Bond, Cardiac Imaging Nurse Navigator Burley Saver, Interim Cardiac Imaging Nurse [facility] Direct Office Dial: [phone]   For scheduling needs, including cancellations and rescheduling, please call Vivien Rota at [phone], option 3.

## 2020-04-01 LAB — LIPID PANEL
Chol/HDL Ratio: 5.7 ratio — ABNORMAL HIGH (ref 0.0–4.4)
Cholesterol, Total: 217 mg/dL — ABNORMAL HIGH (ref 100–199)
HDL: 38 mg/dL — ABNORMAL LOW (ref >39)
LDL Chol Calc (NIH): 144 mg/dL — ABNORMAL HIGH (ref 0–99)
Triglycerides: 195 mg/dL — ABNORMAL HIGH (ref 0–149)
VLDL Cholesterol Cal: 35 mg/dL (ref 5–40)

## 2020-04-01 LAB — BASIC METABOLIC PANEL
BUN/Creatinine Ratio: 16 (ref 12–28)
BUN: 11 mg/dL (ref 8–27)
CO2: 24 mmol/L (ref 20–29)
Calcium: 10 mg/dL (ref 8.7–10.3)
Chloride: 103 mmol/L (ref 96–106)
Creatinine, Ser: 0.7 mg/dL (ref 0.57–1.00)
GFR calc Af Amer: 99 mL/min/{1.73_m2} (ref >59)
GFR calc non Af Amer: 86 mL/min/{1.73_m2} (ref >59)
Glucose: 117 mg/dL — ABNORMAL HIGH (ref 65–99)
Potassium: 4 mmol/L (ref 3.5–5.2)
Sodium: 142 mmol/L (ref 134–144)

## 2020-04-01 LAB — CBC
Hematocrit: 38.2 % (ref 34.0–46.6)
Hemoglobin: 12.3 g/dL (ref 11.1–15.9)
MCH: 25.7 pg — ABNORMAL LOW (ref 26.6–33.0)
MCHC: 32.2 g/dL (ref 31.5–35.7)
MCV: 80 fL (ref 79–97)
Platelets: 190 10*3/uL (ref 150–450)
RBC: 4.78 x10E6/uL (ref 3.77–5.28)
RDW: 13.1 % (ref 11.7–15.4)
WBC: 10.4 10*3/uL (ref 3.4–10.8)

## 2020-04-01 LAB — TSH: TSH: 1.85 u[IU]/mL (ref 0.450–4.500)

## 2020-04-04 ENCOUNTER — Telehealth: Payer: Self-pay | Admitting: Internal Medicine

## 2020-04-04 DIAGNOSIS — E785 Hyperlipidemia, unspecified: Secondary | ICD-10-CM

## 2020-04-04 NOTE — Telephone Encounter (Signed)
    Patient calling for lab results 

## 2020-04-04 NOTE — Telephone Encounter (Signed)
Left messages on home and mobile numbers to call back. ?

## 2020-04-05 MED ORDER — ROSUVASTATIN CALCIUM 10 MG PO TABS: 10.0000 mg | ORAL_TABLET | Freq: Every day | ORAL | 3 refills | Status: DC

## 2020-04-05 NOTE — Telephone Encounter (Signed)
-----   Message from Dorris Carnes V, MD sent at 04/01/2020 10:35 PM EDT ----- CBC is OK Electrolytes and kidney function are normal  Thyroid function is normal  Lipids:   LDL is 144  Needs to be at least below 100,   SHe had problems with lipitor   Reaction not specified. If just achiness would try Crestor 10 mg with f/u of lipids in 2 months

## 2020-04-05 NOTE — Telephone Encounter (Signed)
Reviewed results with patient who verbalized understanding. Confirmed with the patient her issues with Lipitor were body aching.   Instructed the patient to START CRESTOR 10 mg daily. She will have FLP and LFTs drawn at [facility] in [place] in 2 months. She was grateful for call and agrees with treatment plan.

## 2020-04-05 NOTE — Telephone Encounter (Signed)
Patient returning call.

## 2020-04-11 ENCOUNTER — Ambulatory Visit: Payer: Medicare Other | Admitting: Family Medicine

## 2020-04-13 ENCOUNTER — Telehealth (HOSPITAL_COMMUNITY): Payer: Self-pay | Admitting: Emergency Medicine

## 2020-04-13 NOTE — Telephone Encounter (Signed)
Reaching out to patient to offer assistance regarding upcoming cardiac imaging study; pt verbalizes understanding of appt date/time, parking situation and where to check in, pre-test NPO status and medications ordered, and verified current allergies; name and call back number provided for further questions should they arise Marchia Bond RN Navigator Card[facility] [place] [facility] [phone] office [phone] cell  Pt reminded to take PO metoprolol 2 hr prior to scan. Holding zyrtec.

## 2020-04-14 ENCOUNTER — Ambulatory Visit (HOSPITAL_COMMUNITY): Admission: RE | Admit: 2020-04-14 | Payer: Medicare Other | Source: Ambulatory Visit

## 2020-04-14 NOTE — Addendum Note (Signed)
Addended by: Rodman Key on: 04/14/2020 03:54 PM   Modules accepted: Orders

## 2020-04-15 DIAGNOSIS — I10 Essential (primary) hypertension: Secondary | ICD-10-CM | POA: Diagnosis not present

## 2020-04-15 DIAGNOSIS — E785 Hyperlipidemia, unspecified: Secondary | ICD-10-CM | POA: Diagnosis not present

## 2020-04-19 ENCOUNTER — Telehealth (HOSPITAL_COMMUNITY): Payer: Self-pay | Admitting: Emergency Medicine

## 2020-04-19 ENCOUNTER — Other Ambulatory Visit (HOSPITAL_COMMUNITY): Payer: Medicare Other

## 2020-04-19 NOTE — Telephone Encounter (Signed)
Reaching out to patient to offer assistance regarding upcoming cardiac imaging study; pt verbalizes understanding of appt date/time, parking situation and where to check in, pre-test NPO status and medications ordered, and verified current allergies; name and call back number provided for further questions should they arise Marchia Bond RN Navigator Card[facility] [place] [facility] [phone] office [phone] cell  Pt to take an extra dose of metoprolol 2 hr prior to scan.

## 2020-04-21 ENCOUNTER — Ambulatory Visit (HOSPITAL_COMMUNITY)
Admission: RE | Admit: 2020-04-21 | Discharge: 2020-04-21 | Disposition: A | Discharge status: Home / Self Care | Payer: Medicare Other | Source: Ambulatory Visit | Attending: Internal Medicine | Admitting: Internal Medicine

## 2020-04-21 ENCOUNTER — Other Ambulatory Visit: Payer: Self-pay

## 2020-04-21 DIAGNOSIS — R072 Precordial pain: Secondary | ICD-10-CM | POA: Diagnosis not present

## 2020-04-21 DIAGNOSIS — I2510 Atherosclerotic heart disease of native coronary artery without angina pectoris: Secondary | ICD-10-CM | POA: Diagnosis not present

## 2020-04-21 DIAGNOSIS — R931 Abnormal findings on diagnostic imaging of heart and coronary circulation: Secondary | ICD-10-CM | POA: Diagnosis not present

## 2020-04-21 MED ORDER — IOHEXOL 350 MG/ML SOLN
80.0000 mL | Freq: Once | INTRAVENOUS | Status: AC | PRN
  Administered 2020-04-21: 80 mL via INTRAVENOUS

## 2020-04-21 MED ORDER — NITROGLYCERIN 0.4 MG SL SUBL
0.8000 mg | SUBLINGUAL_TABLET | Freq: Once | SUBLINGUAL | Status: AC
  Administered 2020-04-21: 0.8 mg via SUBLINGUAL

## 2020-04-21 MED ORDER — NITROGLYCERIN 0.4 MG SL SUBL
SUBLINGUAL_TABLET | SUBLINGUAL | Status: AC
  Filled 2020-04-21: qty 2

## 2020-04-22 ENCOUNTER — Ambulatory Visit (HOSPITAL_COMMUNITY)
Admission: RE | Admit: 2020-04-22 | Discharge: 2020-04-22 | Disposition: A | Discharge status: Home / Self Care | Payer: Medicare Other | Source: Ambulatory Visit | Attending: Internal Medicine | Admitting: Internal Medicine

## 2020-04-22 DIAGNOSIS — Z7984 Long term (current) use of oral hypoglycemic drugs: Secondary | ICD-10-CM | POA: Diagnosis not present

## 2020-04-22 DIAGNOSIS — Z20822 Contact with and (suspected) exposure to covid-19: Secondary | ICD-10-CM | POA: Diagnosis present

## 2020-04-22 DIAGNOSIS — K449 Diaphragmatic hernia without obstruction or gangrene: Secondary | ICD-10-CM | POA: Diagnosis not present

## 2020-04-22 DIAGNOSIS — Z951 Presence of aortocoronary bypass graft: Secondary | ICD-10-CM | POA: Diagnosis not present

## 2020-04-22 DIAGNOSIS — I2582 Chronic total occlusion of coronary artery: Secondary | ICD-10-CM | POA: Diagnosis not present

## 2020-04-22 DIAGNOSIS — G4700 Insomnia, unspecified: Secondary | ICD-10-CM | POA: Diagnosis not present

## 2020-04-22 DIAGNOSIS — I480 Paroxysmal atrial fibrillation: Secondary | ICD-10-CM | POA: Diagnosis not present

## 2020-04-22 DIAGNOSIS — R001 Bradycardia, unspecified: Secondary | ICD-10-CM | POA: Diagnosis not present

## 2020-04-22 DIAGNOSIS — Z01810 Encounter for preprocedural cardiovascular examination: Secondary | ICD-10-CM | POA: Diagnosis not present

## 2020-04-22 DIAGNOSIS — E876 Hypokalemia: Secondary | ICD-10-CM | POA: Diagnosis not present

## 2020-04-22 DIAGNOSIS — B965 Pseudomonas (aeruginosa) (mallei) (pseudomallei) as the cause of diseases classified elsewhere: Secondary | ICD-10-CM | POA: Diagnosis not present

## 2020-04-22 DIAGNOSIS — D72829 Elevated white blood cell count, unspecified: Secondary | ICD-10-CM | POA: Diagnosis not present

## 2020-04-22 DIAGNOSIS — R072 Precordial pain: Secondary | ICD-10-CM

## 2020-04-22 DIAGNOSIS — R440 Auditory hallucinations: Secondary | ICD-10-CM | POA: Diagnosis not present

## 2020-04-22 DIAGNOSIS — I10 Essential (primary) hypertension: Secondary | ICD-10-CM | POA: Diagnosis not present

## 2020-04-22 DIAGNOSIS — R197 Diarrhea, unspecified: Secondary | ICD-10-CM | POA: Diagnosis not present

## 2020-04-22 DIAGNOSIS — R531 Weakness: Secondary | ICD-10-CM | POA: Diagnosis not present

## 2020-04-22 DIAGNOSIS — I371 Nonrheumatic pulmonary valve insufficiency: Secondary | ICD-10-CM | POA: Diagnosis not present

## 2020-04-22 DIAGNOSIS — I25110 Atherosclerotic heart disease of native coronary artery with unstable angina pectoris: Secondary | ICD-10-CM | POA: Diagnosis not present

## 2020-04-22 DIAGNOSIS — R079 Chest pain, unspecified: Secondary | ICD-10-CM | POA: Diagnosis not present

## 2020-04-22 DIAGNOSIS — Z7901 Long term (current) use of anticoagulants: Secondary | ICD-10-CM | POA: Diagnosis not present

## 2020-04-22 DIAGNOSIS — E872 Acidosis: Secondary | ICD-10-CM | POA: Diagnosis not present

## 2020-04-22 DIAGNOSIS — J9601 Acute respiratory failure with hypoxia: Secondary | ICD-10-CM | POA: Diagnosis not present

## 2020-04-22 DIAGNOSIS — N390 Urinary tract infection, site not specified: Secondary | ICD-10-CM | POA: Diagnosis not present

## 2020-04-22 DIAGNOSIS — I200 Unstable angina: Secondary | ICD-10-CM | POA: Diagnosis not present

## 2020-04-22 DIAGNOSIS — E785 Hyperlipidemia, unspecified: Secondary | ICD-10-CM | POA: Diagnosis not present

## 2020-04-22 DIAGNOSIS — I340 Nonrheumatic mitral (valve) insufficiency: Secondary | ICD-10-CM | POA: Diagnosis not present

## 2020-04-22 DIAGNOSIS — J90 Pleural effusion, not elsewhere classified: Secondary | ICD-10-CM | POA: Diagnosis not present

## 2020-04-22 DIAGNOSIS — E119 Type 2 diabetes mellitus without complications: Secondary | ICD-10-CM | POA: Diagnosis not present

## 2020-04-22 DIAGNOSIS — D62 Acute posthemorrhagic anemia: Secondary | ICD-10-CM | POA: Diagnosis not present

## 2020-04-22 DIAGNOSIS — Z96651 Presence of right artificial knee joint: Secondary | ICD-10-CM | POA: Diagnosis not present

## 2020-04-22 DIAGNOSIS — R931 Abnormal findings on diagnostic imaging of heart and coronary circulation: Secondary | ICD-10-CM | POA: Diagnosis not present

## 2020-04-22 DIAGNOSIS — R410 Disorientation, unspecified: Secondary | ICD-10-CM | POA: Diagnosis not present

## 2020-04-22 DIAGNOSIS — E8770 Fluid overload, unspecified: Secondary | ICD-10-CM | POA: Diagnosis not present

## 2020-04-22 DIAGNOSIS — R42 Dizziness and giddiness: Secondary | ICD-10-CM | POA: Diagnosis not present

## 2020-04-22 DIAGNOSIS — I959 Hypotension, unspecified: Secondary | ICD-10-CM | POA: Diagnosis not present

## 2020-04-22 DIAGNOSIS — Z79899 Other long term (current) drug therapy: Secondary | ICD-10-CM | POA: Diagnosis not present

## 2020-04-22 DIAGNOSIS — J9811 Atelectasis: Secondary | ICD-10-CM | POA: Diagnosis not present

## 2020-04-22 DIAGNOSIS — I2510 Atherosclerotic heart disease of native coronary artery without angina pectoris: Secondary | ICD-10-CM | POA: Diagnosis not present

## 2020-04-22 DIAGNOSIS — K219 Gastro-esophageal reflux disease without esophagitis: Secondary | ICD-10-CM | POA: Diagnosis not present

## 2020-04-25 ENCOUNTER — Telehealth: Payer: Self-pay | Admitting: Student

## 2020-04-25 ENCOUNTER — Inpatient Hospital Stay (HOSPITAL_COMMUNITY)
Admission: EM | Admit: 2020-04-25 | Discharge: 2020-05-10 | DRG: 233 | Disposition: A | Discharge status: Home Health | Payer: Medicare Other | Attending: Cardiothoracic Surgery | Admitting: Cardiothoracic Surgery

## 2020-04-25 ENCOUNTER — Emergency Department (HOSPITAL_COMMUNITY): Payer: Medicare Other

## 2020-04-25 ENCOUNTER — Other Ambulatory Visit: Payer: Self-pay

## 2020-04-25 ENCOUNTER — Encounter (HOSPITAL_COMMUNITY): Payer: Self-pay | Admitting: Emergency Medicine

## 2020-04-25 DIAGNOSIS — E119 Type 2 diabetes mellitus without complications: Secondary | ICD-10-CM | POA: Diagnosis not present

## 2020-04-25 DIAGNOSIS — Z7901 Long term (current) use of anticoagulants: Secondary | ICD-10-CM

## 2020-04-25 DIAGNOSIS — Z888 Allergy status to other drugs, medicaments and biological substances status: Secondary | ICD-10-CM

## 2020-04-25 DIAGNOSIS — I240 Acute coronary thrombosis not resulting in myocardial infarction: Secondary | ICD-10-CM

## 2020-04-25 DIAGNOSIS — R001 Bradycardia, unspecified: Secondary | ICD-10-CM | POA: Diagnosis not present

## 2020-04-25 DIAGNOSIS — Z882 Allergy status to sulfonamides status: Secondary | ICD-10-CM

## 2020-04-25 DIAGNOSIS — E872 Acidosis: Secondary | ICD-10-CM | POA: Diagnosis not present

## 2020-04-25 DIAGNOSIS — Z20822 Contact with and (suspected) exposure to covid-19: Secondary | ICD-10-CM | POA: Diagnosis present

## 2020-04-25 DIAGNOSIS — B965 Pseudomonas (aeruginosa) (mallei) (pseudomallei) as the cause of diseases classified elsewhere: Secondary | ICD-10-CM | POA: Diagnosis not present

## 2020-04-25 DIAGNOSIS — E876 Hypokalemia: Secondary | ICD-10-CM | POA: Diagnosis not present

## 2020-04-25 DIAGNOSIS — J9601 Acute respiratory failure with hypoxia: Secondary | ICD-10-CM | POA: Diagnosis not present

## 2020-04-25 DIAGNOSIS — R32 Unspecified urinary incontinence: Secondary | ICD-10-CM | POA: Diagnosis not present

## 2020-04-25 DIAGNOSIS — Z9849 Cataract extraction status, unspecified eye: Secondary | ICD-10-CM

## 2020-04-25 DIAGNOSIS — R42 Dizziness and giddiness: Secondary | ICD-10-CM | POA: Diagnosis not present

## 2020-04-25 DIAGNOSIS — K219 Gastro-esophageal reflux disease without esophagitis: Secondary | ICD-10-CM | POA: Diagnosis present

## 2020-04-25 DIAGNOSIS — R197 Diarrhea, unspecified: Secondary | ICD-10-CM | POA: Diagnosis not present

## 2020-04-25 DIAGNOSIS — D72829 Elevated white blood cell count, unspecified: Secondary | ICD-10-CM | POA: Diagnosis not present

## 2020-04-25 DIAGNOSIS — Z96651 Presence of right artificial knee joint: Secondary | ICD-10-CM | POA: Diagnosis present

## 2020-04-25 DIAGNOSIS — E8770 Fluid overload, unspecified: Secondary | ICD-10-CM | POA: Diagnosis not present

## 2020-04-25 DIAGNOSIS — G4700 Insomnia, unspecified: Secondary | ICD-10-CM | POA: Diagnosis not present

## 2020-04-25 DIAGNOSIS — D62 Acute posthemorrhagic anemia: Secondary | ICD-10-CM | POA: Diagnosis not present

## 2020-04-25 DIAGNOSIS — I480 Paroxysmal atrial fibrillation: Secondary | ICD-10-CM | POA: Diagnosis not present

## 2020-04-25 DIAGNOSIS — R410 Disorientation, unspecified: Secondary | ICD-10-CM | POA: Diagnosis not present

## 2020-04-25 DIAGNOSIS — I200 Unstable angina: Secondary | ICD-10-CM | POA: Diagnosis present

## 2020-04-25 DIAGNOSIS — N390 Urinary tract infection, site not specified: Secondary | ICD-10-CM | POA: Diagnosis not present

## 2020-04-25 DIAGNOSIS — J9811 Atelectasis: Secondary | ICD-10-CM | POA: Diagnosis not present

## 2020-04-25 DIAGNOSIS — J302 Other seasonal allergic rhinitis: Secondary | ICD-10-CM | POA: Diagnosis present

## 2020-04-25 DIAGNOSIS — K449 Diaphragmatic hernia without obstruction or gangrene: Secondary | ICD-10-CM | POA: Diagnosis not present

## 2020-04-25 DIAGNOSIS — R931 Abnormal findings on diagnostic imaging of heart and coronary circulation: Secondary | ICD-10-CM | POA: Diagnosis not present

## 2020-04-25 DIAGNOSIS — Z951 Presence of aortocoronary bypass graft: Secondary | ICD-10-CM

## 2020-04-25 DIAGNOSIS — I2510 Atherosclerotic heart disease of native coronary artery without angina pectoris: Secondary | ICD-10-CM | POA: Diagnosis not present

## 2020-04-25 DIAGNOSIS — Z7984 Long term (current) use of oral hypoglycemic drugs: Secondary | ICD-10-CM | POA: Diagnosis not present

## 2020-04-25 DIAGNOSIS — Z79899 Other long term (current) drug therapy: Secondary | ICD-10-CM | POA: Diagnosis not present

## 2020-04-25 DIAGNOSIS — R630 Anorexia: Secondary | ICD-10-CM | POA: Diagnosis not present

## 2020-04-25 DIAGNOSIS — I10 Essential (primary) hypertension: Secondary | ICD-10-CM | POA: Diagnosis present

## 2020-04-25 DIAGNOSIS — I2584 Coronary atherosclerosis due to calcified coronary lesion: Secondary | ICD-10-CM | POA: Diagnosis present

## 2020-04-25 DIAGNOSIS — R531 Weakness: Secondary | ICD-10-CM | POA: Diagnosis not present

## 2020-04-25 DIAGNOSIS — I25110 Atherosclerotic heart disease of native coronary artery with unstable angina pectoris: Principal | ICD-10-CM | POA: Diagnosis present

## 2020-04-25 DIAGNOSIS — Z833 Family history of diabetes mellitus: Secondary | ICD-10-CM

## 2020-04-25 DIAGNOSIS — I371 Nonrheumatic pulmonary valve insufficiency: Secondary | ICD-10-CM | POA: Diagnosis not present

## 2020-04-25 DIAGNOSIS — I340 Nonrheumatic mitral (valve) insufficiency: Secondary | ICD-10-CM | POA: Diagnosis not present

## 2020-04-25 DIAGNOSIS — Z9889 Other specified postprocedural states: Secondary | ICD-10-CM

## 2020-04-25 DIAGNOSIS — E785 Hyperlipidemia, unspecified: Secondary | ICD-10-CM | POA: Diagnosis present

## 2020-04-25 DIAGNOSIS — J90 Pleural effusion, not elsewhere classified: Secondary | ICD-10-CM

## 2020-04-25 DIAGNOSIS — Z823 Family history of stroke: Secondary | ICD-10-CM

## 2020-04-25 DIAGNOSIS — I959 Hypotension, unspecified: Secondary | ICD-10-CM | POA: Diagnosis not present

## 2020-04-25 DIAGNOSIS — Z6826 Body mass index (BMI) 26.0-26.9, adult: Secondary | ICD-10-CM

## 2020-04-25 DIAGNOSIS — R079 Chest pain, unspecified: Secondary | ICD-10-CM | POA: Diagnosis not present

## 2020-04-25 DIAGNOSIS — R440 Auditory hallucinations: Secondary | ICD-10-CM | POA: Diagnosis not present

## 2020-04-25 DIAGNOSIS — F32A Depression, unspecified: Secondary | ICD-10-CM | POA: Diagnosis present

## 2020-04-25 DIAGNOSIS — Z01810 Encounter for preprocedural cardiovascular examination: Secondary | ICD-10-CM | POA: Diagnosis not present

## 2020-04-25 DIAGNOSIS — I2582 Chronic total occlusion of coronary artery: Secondary | ICD-10-CM | POA: Diagnosis not present

## 2020-04-25 DIAGNOSIS — E1169 Type 2 diabetes mellitus with other specified complication: Secondary | ICD-10-CM

## 2020-04-25 LAB — CBC
HCT: 41.4 % (ref 36.0–46.0)
Hemoglobin: 12.5 g/dL (ref 12.0–15.0)
MCH: 25.8 pg — ABNORMAL LOW (ref 26.0–34.0)
MCHC: 30.2 g/dL (ref 30.0–36.0)
MCV: 85.4 fL (ref 80.0–100.0)
Platelets: 317 10*3/uL (ref 150–400)
RBC: 4.85 MIL/uL (ref 3.87–5.11)
RDW: 13.9 % (ref 11.5–15.5)
WBC: 10.7 10*3/uL — ABNORMAL HIGH (ref 4.0–10.5)
nRBC: 0 % (ref 0.0–0.2)

## 2020-04-25 LAB — BASIC METABOLIC PANEL
Anion gap: 10 (ref 5–15)
BUN: 10 mg/dL (ref 8–23)
CO2: 24 mmol/L (ref 22–32)
Calcium: 9.7 mg/dL (ref 8.9–10.3)
Chloride: 104 mmol/L (ref 98–111)
Creatinine, Ser: 0.82 mg/dL (ref 0.44–1.00)
GFR, Estimated: >60 mL/min (ref >60)
Glucose, Bld: 161 mg/dL — ABNORMAL HIGH (ref 70–99)
Potassium: 3.7 mmol/L (ref 3.5–5.1)
Sodium: 138 mmol/L (ref 135–145)

## 2020-04-25 LAB — TROPONIN I (HIGH SENSITIVITY): Troponin I (High Sensitivity): 4 ng/L (ref <18)

## 2020-04-25 MED ORDER — NITROGLYCERIN 0.4 MG SL SUBL: 0.4000 mg | SUBLINGUAL_TABLET | SUBLINGUAL | Status: DC | PRN

## 2020-04-25 MED ORDER — INSULIN ASPART 100 UNIT/ML ~~LOC~~ SOLN
0.0000 [IU] | Freq: Three times a day (TID) | SUBCUTANEOUS | Status: DC
  Administered 2020-04-26 – 2020-04-27 (×2): 2 [IU] via SUBCUTANEOUS

## 2020-04-25 MED ORDER — OXYBUTYNIN CHLORIDE ER 10 MG PO TB24
10.0000 mg | ORAL_TABLET | Freq: Every day | ORAL | Status: DC
  Filled 2020-04-25 (×2): qty 1

## 2020-04-25 MED ORDER — LORATADINE 10 MG PO TABS: 10.0000 mg | ORAL_TABLET | Freq: Every day | ORAL | Status: DC

## 2020-04-25 MED ORDER — ONDANSETRON HCL 4 MG/2ML IJ SOLN: 4.0000 mg | Freq: Four times a day (QID) | INTRAMUSCULAR | Status: DC | PRN

## 2020-04-25 MED ORDER — INSULIN ASPART 100 UNIT/ML ~~LOC~~ SOLN: 0.0000 [IU] | Freq: Every day | SUBCUTANEOUS | Status: DC

## 2020-04-25 MED ORDER — PANTOPRAZOLE SODIUM 40 MG PO TBEC: 40.0000 mg | DELAYED_RELEASE_TABLET | Freq: Every day | ORAL | Status: DC

## 2020-04-25 MED ORDER — METOPROLOL TARTRATE 50 MG PO TABS
50.0000 mg | ORAL_TABLET | Freq: Two times a day (BID) | ORAL | Status: DC
  Administered 2020-04-26 (×2): 50 mg via ORAL
  Filled 2020-04-25: qty 2
  Filled 2020-04-25: qty 1

## 2020-04-25 MED ORDER — LORAZEPAM 2 MG/ML IJ SOLN: 0.5000 mg | Freq: Once | INTRAMUSCULAR | Status: DC

## 2020-04-25 MED ORDER — LEVOCETIRIZINE DIHYDROCHLORIDE 5 MG PO TABS: 5.0000 mg | ORAL_TABLET | Freq: Every day | ORAL | Status: DC

## 2020-04-25 MED ORDER — ASPIRIN EC 81 MG PO TBEC: 81.0000 mg | DELAYED_RELEASE_TABLET | Freq: Every day | ORAL | Status: DC

## 2020-04-25 MED ORDER — ROSUVASTATIN CALCIUM 5 MG PO TABS: 10.0000 mg | ORAL_TABLET | Freq: Every day | ORAL | Status: DC

## 2020-04-25 MED ORDER — ISOSORBIDE DINITRATE 10 MG PO TABS
10.0000 mg | ORAL_TABLET | Freq: Two times a day (BID) | ORAL | Status: DC
  Administered 2020-04-26 (×2): 10 mg via ORAL
  Filled 2020-04-25 (×3): qty 1

## 2020-04-25 MED ORDER — ALPRAZOLAM 0.25 MG PO TABS
0.2500 mg | ORAL_TABLET | Freq: Every evening | ORAL | Status: DC | PRN
  Administered 2020-04-26: 0.25 mg via ORAL
  Filled 2020-04-25: qty 1

## 2020-04-25 MED ORDER — ACETAMINOPHEN 325 MG PO TABS
650.0000 mg | ORAL_TABLET | ORAL | Status: DC | PRN
  Administered 2020-04-26: 650 mg via ORAL
  Filled 2020-04-25: qty 2

## 2020-04-25 MED ORDER — ACYCLOVIR 400 MG PO TABS
400.0000 mg | ORAL_TABLET | Freq: Every day | ORAL | Status: DC
  Filled 2020-04-25 (×2): qty 1

## 2020-04-25 NOTE — ED Notes (Signed)
Daughter Tama Gander [phone] would like to be called with any updates.,

## 2020-04-25 NOTE — ED Provider Notes (Signed)
[facility] EMERGENCY DEPARTMENT Provider Note   CSN: [id number] Arrival date & time: 04/25/20  2042     History Chief Complaint  Patient presents with  . Chest Pain    Katrina Castro is a 74 y.o. female with a history of paroxysmal A. fib on rivaroxaban, HTN, diabetes mellitus type 2, migraines, achalasia, and rectal bleeding with a chief complaint of abnormal lab test.  The patient's daughter, [name], reports that they got a call from Dr. Harrington Challenger with cardiology earlier today about the results of a coronary CT performed on November 5, which showed occlusion of the proximal LAD and ostial portion of D1.  She was advised to come to the ER as she would need a cardiac catheterization.  The patient reports she had an episode of left-sided chest pain brought on by exertion and accompanied by diaphoresis, shortness of breath, nausea, and numbness in the left arm earlier today.  She is endorsing burning discomfort in the left upper chest at this time.  No other associated symptoms.  She was last seen by cardiology on October 14.  She had a stress test in January 2021 that showed moderate severity defect in the mid inferoseptal, apical septal, and apical inferior wall that appear to represent soft tissue attenuation.  LVEF 62%.  When she was seen by cardiology in September, she was noted to have left-sided chest pain that radiated down the left arm with exertion, including walking her dog, that would last for several minutes and was accompanied by diaphoresis.  She was started on isosorbide.  Her daughter also notes that she has been having chills for the last few days and some intermittent confusion.  Her daughter reports that she asked the same question 4 times earlier today, which is unusual for the patient.  She also reports that the patient had confusion about her home medications when speaking with the pharmacy tech in the ER earlier tonight.  No fever, cough, palpitations, leg  swelling, sore throat, nasal congestion, abdominal pain, vomiting, diarrhea, dysuria, hematuria, or headache.  Patient has been fully vaccinated against COVID-19 and received a COVID-19 booster last week.  Patient notes that she is very nervous and anxious about the procedure.  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  The history is provided by the patient and medical records. No language interpreter was used.       Past Medical History:  Diagnosis Date  . Arthritis   . Concussion    2015  . Depression   . Diabetes ([place])    type 2  . Dysphagia   . Dysrhythmia    afib  . GERD (gastroesophageal reflux disease)   . Headache   . History of bronchitis   . Hypertension   . Seasonal allergies   . Toenail fungus     Patient Active Problem List   Diagnosis Date Noted  . Unstable angina ([place]) 04/25/2020  . Rectal bleeding 04/16/2019  . Hx of total knee arthroplasty, right 04/03/2017  . Other dysphagia 05/30/2016  . New onset a-fib ([place]) 05/18/2016  . Hypokalemia 05/18/2016  . Diabetes ([place]) 05/18/2016  . Atrial fibrillation ([place]) 05/18/2016  . Achalasia 05/15/2016  . Migraine without aura and without status migrainosus, not intractable 05/10/2015  . GERD (gastroesophageal reflux disease) 01/07/2012  . Dysphagia 11/26/2011    Past Surgical History:  Procedure Laterality Date  . APPENDECTOMY    .  BACK SURGERY     x2  . BOTOX INJECTION N/A 06/06/2016   Procedure: BOTOX INJECTION;  Surgeon: Rogene Houston, MD;  Location: [facility];  Service: Endoscopy;  Laterality: N/A;  . CHOLECYSTECTOMY    . COLONOSCOPY N/A 11/05/2012   Procedure: COLONOSCOPY;  Surgeon: Rogene Houston, MD;  Location: [facility];  Service: Endoscopy;  Laterality: N/A;  1030  . COLONOSCOPY WITH PROPOFOL N/A 05/01/2019   Procedure: COLONOSCOPY WITH PROPOFOL;  Surgeon: Rogene Houston, MD;  Location: [facility];  Service:  Endoscopy;  Laterality: N/A;  11:20am  . ESOPHAGEAL DILATION N/A 07/06/2015   Procedure: ESOPHAGEAL DILATION;  Surgeon: Rogene Houston, MD;  [facility];  Service: Endoscopy;  Laterality: N/A;  . ESOPHAGEAL DILATION N/A 06/06/2016   Procedure: ESOPHAGEAL DILATION;  Surgeon: Rogene Houston, MD;  [facility];  Service: Endoscopy;  Laterality: N/A;  . ESOPHAGOGASTRODUODENOSCOPY N/A 02/12/2014   Procedure: ESOPHAGOGASTRODUODENOSCOPY (EGD);  Surgeon: Rogene Houston, MD;  [facility];  Service: Endoscopy;  Laterality: N/A;  150  . ESOPHAGOGASTRODUODENOSCOPY N/A 07/06/2015   Procedure: ESOPHAGOGASTRODUODENOSCOPY (EGD);  Surgeon: Rogene Houston, MD;  [facility];  Service: Endoscopy;  Laterality: N/A;  1:25 - moved to 1/18 @ 10:30 - Ann to notify pt  . ESOPHAGOGASTRODUODENOSCOPY (EGD) WITH ESOPHAGEAL DILATION N/A 07/25/2012   Procedure: ESOPHAGOGASTRODUODENOSCOPY (EGD) WITH ESOPHAGEAL DILATION;  Surgeon: Rogene Houston, MD;  [facility];  Service: Endoscopy;  Laterality: N/A;  325-rescheduled to [place] notified pt  . ESOPHAGOGASTRODUODENOSCOPY (EGD) WITH PROPOFOL N/A 06/06/2016   Procedure: ESOPHAGOGASTRODUODENOSCOPY (EGD) WITH PROPOFOL;  Surgeon: Rogene Houston, MD;  [facility];  Service: Endoscopy;  Laterality: N/A;  . EYE SURGERY     cataracts removed  . Foot surgeries Bilateral    hammer toes  . MALONEY DILATION N/A 02/12/2014   Procedure: Venia Minks DILATION;  Surgeon: Rogene Houston, MD;  [facility];  Service: Endoscopy;  Laterality: N/A;  . POLYPECTOMY  05/01/2019   Procedure: POLYPECTOMY;  Surgeon: Rogene Houston, MD;  [facility];  Service: Endoscopy;;  colon   . Right knee arthroscopy     x2  . TONSILLECTOMY    . TOTAL ABDOMINAL HYSTERECTOMY    . TOTAL KNEE ARTHROPLASTY Right 04/03/2017   Procedure: RIGHT TOTAL KNEE ARTHROPLASTY;  Surgeon: Latanya Maudlin, MD;  Location: [facility];  Service:  Orthopedics;  Laterality: Right;     OB History   No obstetric history on file.     Family History  Problem Relation Age of Onset  . Stroke Father   . Diabetes Mother   . Diabetes Sister   . Colon cancer Neg Hx     Social History   Tobacco Use  . Smoking status: Never Smoker  . Smokeless tobacco: Never Used  Vaping Use  . Vaping Use: Never used  Substance Use Topics  . Alcohol use: No    Alcohol/week: 0.0 standard drinks    Comment: socially   . Drug use: No    Home Medications Prior to Admission medications   Medication Sig Start Date End Date Taking? Authorizing Provider  acyclovir (ZOVIRAX) 400 MG tablet Take 400 mg by mouth daily. 11/02/19  Yes [provider]  ALPRAZolam Duanne Moron) 0.5 MG tablet Take 0.25 mg by mouth at bedtime as needed for anxiety.  04/01/19  Yes [provider]  cetirizine (ZYRTEC) 10 MG tablet Take 10 mg  by mouth daily.   Yes [provider]  glimepiride (AMARYL) 2 MG tablet Take 2 mg by mouth daily with breakfast.    Yes [provider]  isosorbide dinitrate (ISORDIL) 10 MG tablet Take 1 tablet (10 mg total) by mouth 2 (two) times daily. 03/15/20  Yes Verta Ellen., NP  levocetirizine (XYZAL) 5 MG tablet Take 5 mg by mouth daily.    Yes [provider]  metFORMIN (GLUCOPHAGE) 500 MG tablet Take 500 mg by mouth 2 (two) times daily with a meal.  04/15/19  Yes [provider]  metoprolol (LOPRESSOR) 50 MG tablet Take 50 mg by mouth 2 (two) times daily.    Yes [provider]  oxybutynin (DITROPAN-XL) 10 MG 24 hr tablet Take 10 mg by mouth daily.   Yes [provider]  pantoprazole (PROTONIX) 40 MG tablet Take 40 mg by mouth daily.   Yes [provider]  rivaroxaban (XARELTO) 20 MG TABS tablet Take 20 mg by mouth every other day.   Yes [provider]  rosuvastatin (CRESTOR) 10 MG tablet Take 1 tablet (10 mg total) by mouth daily. 04/05/20 03/31/21 Yes Fay Records, MD  amoxicillin (AMOXIL) 500 MG capsule Take 1,000 mg by mouth 2 (two) times daily. Before dental visits 01/25/20   [provider]  docusate sodium (COLACE) 100 MG capsule Take 2 capsules (200 mg total) by mouth at bedtime. Patient not taking: Reported on 04/25/2020 05/01/19   Rogene Houston, MD    Allergies    Levofloxacin, Cardizem [diltiazem], Lipitor [atorvastatin], Sulfa antibiotics, and Diazepam  Review of Systems   Review of Systems  Constitutional: Positive for chills. Negative for activity change and fever.  HENT: Negative for congestion and sore throat.   Respiratory: Positive for shortness of breath. Negative for cough.   Cardiovascular: Positive for chest pain. Negative for palpitations and leg swelling.  Gastrointestinal: Positive for nausea. Negative for abdominal pain, constipation, diarrhea and vomiting.  Genitourinary: Negative for dysuria and urgency.  Musculoskeletal: Negative for back pain, myalgias, neck pain and neck stiffness.  Skin: Negative for rash.  Allergic/Immunologic: Negative for immunocompromised state.  Neurological: Positive for numbness (left arm). Negative for weakness and headaches.  Psychiatric/Behavioral: Positive for confusion. Negative for agitation, hallucinations and suicidal ideas. The patient is not nervous/anxious and is not hyperactive.     Physical Exam Updated Vital Signs BP 132/60   Pulse 77   Temp 98.2 F (36.8 C) (Oral)   Resp 19   Ht 5\' 3"  (1.6 m)   Wt 67.2 kg   SpO2 99%   BMI 26.24 kg/m   Physical Exam Vitals and nursing note reviewed.  Constitutional:      General: She is not in acute distress.    Appearance: She is not toxic-appearing or diaphoretic.     Comments: Well-appearing, no acute distress.  Patient appears anxious.  HENT:     Head: Normocephalic.  Eyes:     Conjunctiva/sclera: Conjunctivae normal.  Cardiovascular:     Rate and Rhythm: Normal rate and regular rhythm.     Heart sounds: No  murmur heard.  No friction rub. No gallop.      Comments: Peripheral pulses are 2+ and symmetric. Pulmonary:     Effort: Pulmonary effort is normal. No respiratory distress.     Breath sounds: No stridor. No wheezing, rhonchi or rales.  Chest:     Chest wall: No tenderness.  Abdominal:     General: There  is no distension.     Palpations: Abdomen is soft. There is no mass.     Tenderness: There is no abdominal tenderness. There is no right CVA tenderness, left CVA tenderness, guarding or rebound.     Hernia: No hernia is present.  Musculoskeletal:     Cervical back: Neck supple.     Right lower leg: No edema.     Left lower leg: No edema.  Skin:    General: Skin is warm.     Capillary Refill: Capillary refill takes less than 2 seconds.     Findings: No rash.  Neurological:     Mental Status: She is alert.     Comments: Alert and oriented x4.  Answers questions appropriately with complete, fluent sentences.  Psychiatric:        Behavior: Behavior normal.     ED Results / Procedures / Treatments   Labs (all labs ordered are listed, but only abnormal results are displayed) Labs Reviewed  BASIC METABOLIC PANEL - Abnormal; Notable for the following components:      Result Value   Glucose, Bld 161 (*)    All other components within normal limits  CBC - Abnormal; Notable for the following components:   WBC 10.7 (*)    MCH 25.8 (*)    All other components within normal limits  RESPIRATORY PANEL BY RT PCR (FLU A&B, COVID)  TSH  HEMOGLOBIN A1C  BRAIN NATRIURETIC PEPTIDE  BASIC METABOLIC PANEL  LIPID PANEL  CBC  PROTIME-INR  HEMOGLOBIN A1C  APTT  PROTIME-INR  HEPARIN LEVEL (UNFRACTIONATED)  URINALYSIS, ROUTINE W REFLEX MICROSCOPIC  TROPONIN I (HIGH SENSITIVITY)  TROPONIN I (HIGH SENSITIVITY)  TROPONIN I (HIGH SENSITIVITY)    EKG EKG Interpretation  Date/Time:  Monday April 25 2020 22:40:04 EST Ventricular Rate:  79 PR Interval:    QRS Duration: 84 QT  Interval:  439 QTC Calculation: 504 R Axis:   8 Text Interpretation: Atrial fibrillation Paired ventricular premature complexes Low voltage, precordial leads Prolonged QT interval No STEMI Confirmed by Nanda Quinton [phone]) on 04/25/2020 10:42:09 PM   Radiology DG Chest 2 View  Result Date: 04/25/2020 CLINICAL DATA:  Chest pain for 1 day, reflux, hypertension EXAM: CHEST - 2 VIEW COMPARISON:  03/24/2020 FINDINGS: Frontal and lateral views of the chest demonstrate an unremarkable cardiac silhouette. Small hiatal hernia. No airspace disease, effusion, or pneumothorax. Mild bibasilar scarring unchanged. No acute bony abnormalities. IMPRESSION: 1. No acute intrathoracic process. 2. Hiatal hernia. Electronically Signed   By: Randa Ngo M.D.   On: 04/25/2020 21:12    Procedures Procedures (including critical care time)  Medications Ordered in ED Medications  acyclovir (ZOVIRAX) tablet 400 mg (has no administration in time range)  isosorbide dinitrate (ISORDIL) tablet 10 mg (has no administration in time range)  metoprolol tartrate (LOPRESSOR) tablet 50 mg (has no administration in time range)  rosuvastatin (CRESTOR) tablet 10 mg (has no administration in time range)  ALPRAZolam (XANAX) tablet 0.25 mg (has no administration in time range)  pantoprazole (PROTONIX) EC tablet 40 mg (has no administration in time range)  oxybutynin (DITROPAN-XL) 24 hr tablet 10 mg (has no administration in time range)  loratadine (CLARITIN) tablet 10 mg (has no administration in time range)  aspirin EC tablet 81 mg (has no administration in time range)  nitroGLYCERIN (NITROSTAT) SL tablet 0.4 mg (has no administration in time range)  acetaminophen (TYLENOL) tablet 650 mg (has no administration in time range)  ondansetron (ZOFRAN) injection 4 mg (has  no administration in time range)  insulin aspart (novoLOG) injection 0-9 Units (has no administration in time range)  insulin aspart (novoLOG) injection 0-5 Units  (has no administration in time range)    ED Course  I have reviewed the triage vital signs and the nursing notes.  Pertinent labs & imaging results that were available during my care of the patient were reviewed by me and considered in my medical decision making (see chart for details).    MDM Rules/Calculators/A&P                          74 year old female with a history of paroxysmal A. fib, HTN, diabetes mellitus type 2, migraines, achalasia, and rectal bleeding who presents to the emergency department accompanied by her daughter after she received a call from cardiology that she had an abnormal coronary CT on 11/5 that showed occlusion of the proximal LAD and ostial portion of D1.   Vital signs are stable.  Patient is appears anxious, but is in no acute distress.  She is endorsing some burning discomfort in the chest on my evaluation.  The patient was discussed with Dr. Laverta Baltimore, attending physician.  Patient has been having episodes of chest pain accompanied by left arm numbness, nausea, shortness of breath.  Chest pain is brought on by exertion.  Symptoms are very concerning for unstable angina in the setting of abnormal coronary CT.  Differential diagnosis includes NSTEMI, MI, aortic dissection, PE, esophageal spasms, acute onset of congestive heart failure, and musculoskeletal pain.  Labs ordered include CBC, BMP, troponin, COVID-19 test, and urinalysis.  Labs have been reviewed and independently interpreted by me.  CBC with minimal leukocytosis.  Initial troponin is 4.  COVID-19 test and urinalysis are pending.  EKG with atrial fibrillation and prolonged QTC.  Imaging has been reviewed and independently interpreted by me.  Chest x-ray demonstrates a hiatal hernia, but no acute abnormalities.  Her daughter also reports that she has been having chills and intermittent confusion.  On exam, she is alert and oriented x4.  Doubt ischemic CVA, subarachnoid hemorrhage, or intracranial  hemorrhage.  Given that she is also having chills, will check for UTI.  No evidence of pneumonia on chest x-ray.  Consulted cardiology and Dr. Blossom Hoops has seen and evaluated the patient.  Please see his note for further recommendations.  Cardiology will admit the patient for cardiac catheterization for unstable angina.  The patient will be started on heparin.  The patient appears reasonably stabilized for admission considering the current resources, flow, and capabilities available in the ED at this time, and I doubt any other [facility] requiring further screening and/or treatment in the ED prior to admission.   Final Clinical Impression(s) / ED Diagnoses Final diagnoses:  Unstable angina ([place])  Occlusion of proximal portion of left anterior descending (LAD) coronary artery [facility])    Rx / DC Orders ED Discharge Orders    None       Joanne Gavel, PA-C 04/25/20 [id number]    Margette Fast, MD 04/28/20 [phone]

## 2020-04-25 NOTE — ED Triage Notes (Signed)
Pt c/o cp for the past few days getting worse today. Pt got results for a CT showing blockages on heart arteries. Pt having SOB pt states she took 81 mg aspirin prior to arrive to ED.

## 2020-04-25 NOTE — H&P (Signed)
Cardiology History & Physical    Patient ID: Katrina Castro MRN: [id number], DOB/AGE: [birth year]   Admit date: 04/25/2020  Primary Physician: Glenda Chroman, MD Primary Cardiologist: No primary care provider on file.  Patient Profile    74 year old female with history of paroxysmal AF, esophageal dysmotility, T2DM, and hypertension presenting for evaluation of unstable angina.  History of Present Illness    This is a 74 year old female with history as above, notable for pAF on rivaroxaban and recent high risk CT-FFR study presenting with chest pain.  She was recently evaluated by Dr. Harrington Challenger with recent history notable for mod/high risk inducible ischemia w/ preserved LVEF.  She reporte dleft-sided chest paint hat was radiating to her left arm with associated diaphoresis.  She was started on isosorbide at that time.  Of late she reports that she has been having worsening episodes of exertional anginal, shortness of breath, and pain radiating to the arm.  On my evaluation in the ED waiting room she reported substernal chest pain radiating to her arm which was becoming numb.  Initial ECG reviewed and shows old septal infarct pattern with some non-specific ST changes which is not significantly changed from prior.  Initial troponin pending at the time of my evaluation.  Past Medical History   Past Medical History:  Diagnosis Date  . Arthritis   . Concussion    2015  . Depression   . Diabetes ([place])    type 2  . Dysphagia   . Dysrhythmia    afib  . GERD (gastroesophageal reflux disease)   . Headache   . History of bronchitis   . Hypertension   . Seasonal allergies   . Toenail fungus     Past Surgical History:  Procedure Laterality Date  . APPENDECTOMY    . BACK SURGERY     x2  . BOTOX INJECTION N/A 06/06/2016   Procedure: BOTOX INJECTION;  Surgeon: Rogene Houston, MD;  [facility];  Service: Endoscopy;  Laterality: N/A;  . CHOLECYSTECTOMY    . COLONOSCOPY N/A  11/05/2012   Procedure: COLONOSCOPY;  Surgeon: Rogene Houston, MD;  [facility]: [facility];  Service: Endoscopy;  Laterality: N/A;  1030  . COLONOSCOPY WITH PROPOFOL N/A 05/01/2019   Procedure: COLONOSCOPY WITH PROPOFOL;  Surgeon: Rogene Houston, MD;  [facility];  Service: Endoscopy;  Laterality: N/A;  11:20am  . ESOPHAGEAL DILATION N/A 07/06/2015   Procedure: ESOPHAGEAL DILATION;  Surgeon: Rogene Houston, MD;  [facility];  Service: Endoscopy;  Laterality: N/A;  . ESOPHAGEAL DILATION N/A 06/06/2016   Procedure: ESOPHAGEAL DILATION;  Surgeon: Rogene Houston, MD;  [facility];  Service: Endoscopy;  Laterality: N/A;  . ESOPHAGOGASTRODUODENOSCOPY N/A 02/12/2014   Procedure: ESOPHAGOGASTRODUODENOSCOPY (EGD);  Surgeon: Rogene Houston, MD;  [facility];  Service: Endoscopy;  Laterality: N/A;  150  . ESOPHAGOGASTRODUODENOSCOPY N/A 07/06/2015   Procedure: ESOPHAGOGASTRODUODENOSCOPY (EGD);  Surgeon: Rogene Houston, MD;  [facility];  Service: Endoscopy;  Laterality: N/A;  1:25 - moved to 1/18 @ 10:30 - Ann to notify pt  . ESOPHAGOGASTRODUODENOSCOPY (EGD) WITH ESOPHAGEAL DILATION N/A 07/25/2012   Procedure: ESOPHAGOGASTRODUODENOSCOPY (EGD) WITH ESOPHAGEAL DILATION;  Surgeon: Rogene Houston, MD;  [facility];  Service: Endoscopy;  Laterality: N/A;  325-rescheduled to [place] notified pt  . ESOPHAGOGASTRODUODENOSCOPY (EGD) WITH PROPOFOL N/A 06/06/2016   Procedure: ESOPHAGOGASTRODUODENOSCOPY (EGD) WITH PROPOFOL;  Surgeon: Bernadene Person  Gloriann Loan, MD;  [facility];  Service: Endoscopy;  Laterality: N/A;  . EYE SURGERY     cataracts removed  . Foot surgeries Bilateral    hammer toes  . MALONEY DILATION N/A 02/12/2014   Procedure: Venia Minks DILATION;  Surgeon: Rogene Houston, MD;  [facility];  Service: Endoscopy;  Laterality: N/A;  . POLYPECTOMY  05/01/2019   Procedure: POLYPECTOMY;  Surgeon: Rogene Houston, MD;   [facility];  Service: Endoscopy;;  colon   . Right knee arthroscopy     x2  . TONSILLECTOMY    . TOTAL ABDOMINAL HYSTERECTOMY    . TOTAL KNEE ARTHROPLASTY Right 04/03/2017   Procedure: RIGHT TOTAL KNEE ARTHROPLASTY;  Surgeon: Latanya Maudlin, MD;  Location: [facility];  Service: Orthopedics;  Laterality: Right;     Allergies Allergies  Allergen Reactions  . Levofloxacin Anaphylaxis and Rash  . Cardizem [Diltiazem] Other (See Comments)    Patient states that she could not think, felt that she was in the twilight zone. Arm discomfort.  . Lipitor [Atorvastatin] Other (See Comments)    Caused her body to be "out of whack"  - elevated sugar also.  . Sulfa Antibiotics Itching  . Diazepam Nausea Only    Home Medications    Prior to Admission medications   Medication Sig Start Date End Date Taking? Authorizing Provider  acyclovir (ZOVIRAX) 400 MG tablet Take 400 mg by mouth daily. 11/02/19   [provider]  ALPRAZolam Duanne Moron) 0.5 MG tablet Take 0.25 mg by mouth at bedtime as needed. 04/01/19   [provider]  amoxicillin (AMOXIL) 500 MG capsule Take 1,000 mg by mouth 2 (two) times daily. Before dental visits 01/25/20   [provider]  cetirizine (ZYRTEC) 10 MG tablet Take 10 mg by mouth daily.    [provider]  docusate sodium (COLACE) 100 MG capsule Take 2 capsules (200 mg total) by mouth at bedtime. 05/01/19   Rehman, Mechele Dawley, MD  glimepiride (AMARYL) 2 MG tablet Take 2 mg by mouth as needed.    [provider]  isosorbide dinitrate (ISORDIL) 10 MG tablet Take 1 tablet (10 mg total) by mouth 2 (two) times daily. 03/15/20   Verta Ellen., NP  levocetirizine (XYZAL) 5 MG tablet Take 5 mg by mouth every evening.    [provider]  metFORMIN (GLUCOPHAGE) 500 MG tablet Take 500 mg by mouth 2 (two) times daily with a meal.  04/15/19   [provider]  metoprolol (LOPRESSOR) 50 MG tablet Take 50 mg by mouth 2 (two)  times daily.     [provider]  oxybutynin (DITROPAN-XL) 10 MG 24 hr tablet Take 10 mg by mouth daily.    [provider]  pantoprazole (PROTONIX) 40 MG tablet Take 40 mg by mouth daily.    [provider]  rivaroxaban (XARELTO) 20 MG TABS tablet Take 20 mg by mouth every other day.    [provider]  rosuvastatin (CRESTOR) 10 MG tablet Take 1 tablet (10 mg total) by mouth daily. 04/05/20 03/31/21  Fay Records, MD   Last took rivaroxaban 11/6 evening.  Took full dose aspirin and a dose of nitroglycerin given to her by her neighbor today.  Family History    Family History  Problem Relation Age of Onset  . Stroke Father   . Diabetes Mother   . Diabetes Sister   . Colon cancer Neg Hx    She  indicated that her mother is deceased. She indicated that her father is deceased. She indicated that one of her two sisters is deceased. She indicated that her child is alive. She indicated that the status of her neg hx is unknown. She indicated that her other is alive.   Social History    Social History   Socioeconomic History  . Marital status: Divorced    Spouse name: Not on file  . Number of children: Not on file  . Years of education: Not on file  . Highest education level: Not on file  Occupational History  . Not on file  Tobacco Use  . Smoking status: Never Smoker  . Smokeless tobacco: Never Used  Vaping Use  . Vaping Use: Never used  Substance and Sexual Activity  . Alcohol use: No    Alcohol/week: 0.0 standard drinks    Comment: socially   . Drug use: No  . Sexual activity: Not Currently    Partners: Male    Birth control/protection: None  Other Topics Concern  . Not on file  Social History Narrative  . Not on file   Social Determinants of Health   Financial Resource Strain:   . Difficulty of Paying Living Expenses: Not on file  Food Insecurity:   . Worried About Charity fundraiser in the Last Year: Not on file  . Ran Out of  Food in the Last Year: Not on file  Transportation Needs:   . Lack of Transportation (Medical): Not on file  . Lack of Transportation (Non-Medical): Not on file  Physical Activity:   . Days of Exercise per Week: Not on file  . Minutes of Exercise per Session: Not on file  Stress:   . Feeling of Stress : Not on file  Social Connections:   . Frequency of Communication with Friends and Family: Not on file  . Frequency of Social Gatherings with Friends and Family: Not on file  . Attends Religious Services: Not on file  . Active Member of Clubs or Organizations: Not on file  . Attends Archivist Meetings: Not on file  . Marital Status: Not on file  Intimate Partner Violence:   . Fear of Current or Ex-Partner: Not on file  . Emotionally Abused: Not on file  . Physically Abused: Not on file  . Sexually Abused: Not on file     Review of Systems    General:  No chills, fever, night sweats or weight changes.  Cardiovascular:  No chest pain, dyspnea on exertion, edema, orthopnea, palpitations, paroxysmal nocturnal dyspnea. Dermatological: No rash, lesions/masses Respiratory: No cough, dyspnea Urologic: No hematuria, dysuria Abdominal:   No nausea, vomiting, diarrhea, bright red blood per rectum, melena, or hematemesis Neurologic:  No visual changes, wkns, changes in mental status. All other systems reviewed and are otherwise negative except as noted above.  Physical Exam    BP (!) 156/125 (BP Location: Right Arm)   Pulse 77   Temp 98.2 F (36.8 C) (Oral)   Resp 18   Ht 5\' 3"  (1.6 m)   Wt 67.2 kg   SpO2 99%   BMI 26.24 kg/m  General: Alert, NAD HEENT: Normal  Neck: No bruits or JVD. Lungs:  Resp regular and unlabored, CTA bilaterally. Heart: Regular rhythm, no s3, s4, or murmurs. Abdomen: Soft, non-tender, non-distended, BS +.  Extremities: Warm. No clubbing, cyanosis or edema. DP/PT/Radials 2+ and equal bilaterally. Psych: Normal affect. Neuro: Alert and oriented.  No gross focal deficits.  No abnormal movements.  Labs   10.7>12.5<317 Troponin in process BMP in process   Radiology Studies    DG Chest 2 View  Result Date: 04/25/2020 CLINICAL DATA:  Chest pain for 1 day, reflux, hypertension EXAM: CHEST - 2 VIEW COMPARISON:  03/24/2020 FINDINGS: Frontal and lateral views of the chest demonstrate an unremarkable cardiac silhouette. Small hiatal hernia. No airspace disease, effusion, or pneumothorax. Mild bibasilar scarring unchanged. No acute bony abnormalities. IMPRESSION: 1. No acute intrathoracic process. 2. Hiatal hernia. Electronically Signed   By: Randa Ngo M.D.   On: 04/25/2020 21:12   CT CORONARY MORPH W/CTA COR W/SCORE W/CA W/CM &/OR WO/CM  Addendum Date: 04/21/2020   ADDENDUM REPORT: 04/21/2020 15:58 CLINICAL DATA:  74 year old female with h/o hyperlipidemia, paroxysmal atrial fibrillation, esophageal dysmotility, DM 2, hypertension and exertional chest pain. EXAM: Cardiac/Coronary  CTA TECHNIQUE: The patient was scanned on a [employer]. FINDINGS: A 100 kV prospective scan was triggered in the descending thoracic aorta at 111 HU's. Axial non-contrast 3 mm slices were carried out through the heart. The data set was analyzed on a dedicated work station and scored using the [place]. Gantry rotation speed was 250 msecs and collimation was .6 mm. 50 mg of PO Metoprolol and 0.8 mg of sl NTG was given. The 3D data set was reconstructed in 5% intervals of the 67-82 % of the R-R cycle. Diastolic phases were analyzed on a dedicated work station using MPR, MIP and VRT modes. The patient received 80 cc of contrast. Aorta: Normal size. Minimal atherosclerotic plaque, no calcifications. No dissection. Aortic Valve:  Trileaflet.  No calcifications. Coronary Arteries:  Normal coronary origin.  Right dominance. RCA is a very large dominant artery that gives rise to PDA and PLA. There is mild calcified plaque in the ostial portion with stenosis  25-49%. Mid and distal RCA have only luminal irregularities. PDA and PLA have small lumen and mild diffuse disease. Left main is a moderate caliber artery that gives rise to LAD and LCX arteries. Left main has moderate mixed, predominantly calcified plaque that extends into the proximal LAD with stenosis 50-69%. LAD is a medium caliber artery that gives rise to one diagonal artery. Proximal LAD has a long heavily calcified plaque with suspicion for a focal stenosis > 70%. After this stenosis the lumen of mid and distal LAD is very small. D1 is a medium caliber artery that originated in the calcified segment of the proximal LAD that extends into the ostial portion of D1 and is suspicious for > 70% stenosis. LCX is a very small lumen non-dominant artery. Other findings: Normal pulmonary vein drainage into a large left atrium. Normal left atrial appendage without a thrombus. Normal size of the pulmonary artery. IMPRESSION: 1. Coronary calcium score of 122. This was 47 percentile for age and sex matched control. 2. Normal coronary origin with right dominance. 3. CAD-RADS 4 Severe stenosis in the distal left main and proximal LAD. (70-99% or > 50% left main). CT FFR is recommended. Consider symptom-guided anti-ischemic pharmacotherapy as well as risk factor modification per guideline directed care. Electronically Signed   By: Ena Dawley   On: 04/21/2020 15:58   Result Date: 04/21/2020 EXAM: OVER-READ INTERPRETATION  CT CHEST The following report is an over-read performed by radiologist Dr. Vinnie Langton of [facility], [place] on 04/21/2020. This over-read does not include interpretation of cardiac or coronary anatomy or pathology. The coronary calcium score/coronary CTA interpretation by the cardiologist is attached. COMPARISON:  None.  FINDINGS: Aortic atherosclerosis. Moderate-sized hiatal hernia. Within the visualized portions of the thorax there are no suspicious appearing pulmonary nodules or masses,  there is no acute consolidative airspace disease, no pleural effusions, no pneumothorax and no lymphadenopathy. Visualized portions of the upper abdomen are unremarkable. There are no aggressive appearing lytic or blastic lesions noted in the visualized portions of the skeleton. IMPRESSION: 1.  Aortic Atherosclerosis (ICD10-I70.0). Electronically Signed: By: Vinnie Langton M.D. On: 04/21/2020 14:52   CT CORONARY FRACTIONAL FLOW RESERVE DATA PREP  Result Date: 04/22/2020 EXAM: CT FFR ANALYSIS CLINICAL DATA:  74 year old female with abnormal CCTA. FINDINGS: FFRct analysis was performed on the original cardiac CT angiogram dataset. Diagrammatic representation of the FFRct analysis is provided in a separate PDF document in PACS. This dictation was created using the PDF document and an interactive 3D model of the results. 3D model is not available in the EMR/PACS. Normal FFR range is >0.80. 1. Left Main: 0.98. 2. LAD: Occluded at the proximal segment. 3. D1: Occluded at the ostium. 4. LCX: Very small nondominant artery. 5. RCA: Proximal: 0.98.  Distal: 0.97. IMPRESSION: 1. CT FFR analysis showed occluded proximal LAD and ostial portion of D1. Consider cardiac catheterization for further evaluation. Electronically Signed   By: Ena Dawley   On: 04/22/2020 11:27    ECG & Cardiac Imaging    ECG per HPI NSR with old septal infarct pattern unchanged from prior.  CTA Coronary w/ mild disease of the ostial RCA, LM 50-70% extending to proximal LAD with heavily calcified ostial LAD lesion and small mid/distal LAD, obstructive D1, small non-dominant LCx CT FFR w/ occluded proximal LAD, Occluded ostial D1, small non-dominant LCx, non-obstructive RCA lesion  TTE 03/17/20 w/ normal LVEF without RWMA, mild LVH, normal RV function, mild MR otherwise no significant valve abnormalities.  07/10/19 nuclear perfusion study (vasodilator)  There was no ST segment deviation noted during stress.  Defect 1: There is a  medium defect of moderate severity present in the mid inferoseptal, apical septal and apical inferior location. This is likely due to soft tissue attenuation as regional wall motion appears grossly normal.  This is a low risk study.  Nuclear stress EF: 62%.  Assessment & Plan    74 year old female with history of hypertension, hyperlipidemia, type 2 diabetes, paroxysmal A. fib only intermittently adherent to Xarelto who presents with chest pain in the setting of recent positive coronary CTA FFR with findings of LM >50% with abnormal pLAD.  Findings were significant for proximal LAD and left main disease.  She was instructed to present to the ED by Dr. Harrington Challenger.  EKG is largely unchanged from prior.  Troponin is below threshold.  She has no signs of failure on exam.  Problem list Unstable angina Obstructive chronic coronary artery disease T2DM History of paroxysmal atrial fibrillation on rivaroxaban  Plan #Unstable angina #Obstructive coronary artery disease, LM --> pLAD -s/p ASA 325 mg, ASA 81 mg daily to follow -Heparin ACS nomogram, pharmacy consult -Continue home statin -Lipid panel, A1C, TSH -NPO for invasive angiography, revascularization plan pending these finidngs. -Continue home metoprolol tartarte 50 mg BID, isosorbide 10 mg daily can titrate up as tolerated -SLN PRN -Telemetry  T2DM - Hold home antihyperglycemics - SSI correctional no basal - Favor SGLT2i and/or GLP1Ra going forward without proven benefit to glimeperide/metformin in her context.  History of paroxysmal atrial fibrillation on rivaroxaban - Hold home rivaroxaban w/ heparin infusion (36 hours since last dose tomorrow morning) - Continue metoprolol -  Emphasized importance of DOAC adherence as outpatient (C2V at least 4)  Malnutrition: None apparent Nutrition: NPO for cath DVT ppx: Therapeutic antivcoagualtion GI: Continue home PPI Advanced Care Planning: Full code   Signed, Delight Hoh,  MD 04/25/2020, 10:07 PM

## 2020-04-25 NOTE — Progress Notes (Signed)
Fielded a call from Ms. Looney's daughter Tammy regarding her presentation to ED.  She is in waiting room and daughter is quite worried.  Informed her I will evaluate the patient as best I can in waiting and facilitate rooming.  Delton Prairie, MD Cardiology Fellow [facility]

## 2020-04-25 NOTE — Progress Notes (Addendum)
ANTICOAGULATION CONSULT NOTE - Initial Consult  Pharmacy Consult for Heparin Indication: chest pain/ACS  Allergies  Allergen Reactions  . Levofloxacin Anaphylaxis and Rash  . Cardizem [Diltiazem] Other (See Comments)    Patient states that she could not think, felt that she was in the twilight zone. Arm discomfort.  . Lipitor [Atorvastatin] Other (See Comments)    Caused her body to be "out of whack"  - elevated sugar also.  . Sulfa Antibiotics Itching  . Diazepam Nausea Only    Patient Measurements: Height: 5\' 3"  (160 cm) Weight: 67.2 kg (148 lb 2.4 oz) IBW/kg (Calculated) : 52.4  Vital Signs: Temp: 98.2 F (36.8 C) (11/08 2056) Temp Source: Oral (11/08 2056) BP: 132/60 (11/08 2245) Pulse Rate: 77 (11/08 2056)  Labs: Recent Labs    04/25/20 2058  HGB 12.5  HCT 41.4  PLT 317  CREATININE 0.82  TROPONINIHS 4    Estimated Creatinine Clearance: 56.2 mL/min (by C-G formula based on SCr of 0.82 mg/dL).   Medical History: Past Medical History:  Diagnosis Date  . Arthritis   . Concussion    2015  . Depression   . Diabetes ([place])    type 2  . Dysphagia   . Dysrhythmia    afib  . GERD (gastroesophageal reflux disease)   . Headache   . History of bronchitis   . Hypertension   . Seasonal allergies   . Toenail fungus     Medications:  No current facility-administered medications on file prior to encounter.   Current Outpatient Medications on File Prior to Encounter  Medication Sig Dispense Refill  . acyclovir (ZOVIRAX) 400 MG tablet Take 400 mg by mouth daily.    Marland Kitchen ALPRAZolam (XANAX) 0.5 MG tablet Take 0.25 mg by mouth at bedtime as needed for anxiety.     . cetirizine (ZYRTEC) 10 MG tablet Take 10 mg by mouth daily.    Marland Kitchen glimepiride (AMARYL) 2 MG tablet Take 2 mg by mouth daily with breakfast.     . isosorbide dinitrate (ISORDIL) 10 MG tablet Take 1 tablet (10 mg total) by mouth 2 (two) times daily. 60 tablet 6  . levocetirizine (XYZAL) 5 MG tablet Take 5 mg  by mouth daily.     . metFORMIN (GLUCOPHAGE) 500 MG tablet Take 500 mg by mouth 2 (two) times daily with a meal.     . metoprolol (LOPRESSOR) 50 MG tablet Take 50 mg by mouth 2 (two) times daily.     Marland Kitchen oxybutynin (DITROPAN-XL) 10 MG 24 hr tablet Take 10 mg by mouth daily.    . pantoprazole (PROTONIX) 40 MG tablet Take 40 mg by mouth daily.    . rivaroxaban (XARELTO) 20 MG TABS tablet Take 20 mg by mouth every other day.    . rosuvastatin (CRESTOR) 10 MG tablet Take 1 tablet (10 mg total) by mouth daily. 90 tablet 3  . amoxicillin (AMOXIL) 500 MG capsule Take 1,000 mg by mouth 2 (two) times daily. Before dental visits    . docusate sodium (COLACE) 100 MG capsule Take 2 capsules (200 mg total) by mouth at bedtime. (Patient not taking: Reported on 04/25/2020)       Assessment: 74 y.o. female with chest pain, h/o Afib and Xarelto on hold, for heparin. Last dose of Xarelto ~ 36 hours per MD< as pt has been taking every other day.    Goal of Therapy:  APTT 66-102 sec  Heparin level 0.3-0.7 units/ml Monitor platelets by anticoagulation protocol: Yes  Plan:  Start heparin 850 units/hr APTT in 8 hours  Thelonious Kauffmann, [place] 04/25/2020,11:17 PM   Addendum: Baseline heparin level < 0.1.  Will add heparin 3000 units IV bolus, monitor using heparin levels only  Phillis Knack, PharmD, BCPS 04/26/2020 12:50 AM

## 2020-04-26 ENCOUNTER — Inpatient Hospital Stay (HOSPITAL_COMMUNITY): Payer: Medicare Other

## 2020-04-26 ENCOUNTER — Other Ambulatory Visit: Payer: Self-pay | Admitting: *Deleted

## 2020-04-26 ENCOUNTER — Encounter (HOSPITAL_COMMUNITY): Payer: Self-pay | Admitting: Cardiology

## 2020-04-26 ENCOUNTER — Encounter (HOSPITAL_COMMUNITY)
Admission: EM | Disposition: A | Discharge status: Home / Self Care | Payer: Self-pay | Source: Home / Self Care | Attending: Cardiothoracic Surgery

## 2020-04-26 DIAGNOSIS — R931 Abnormal findings on diagnostic imaging of heart and coronary circulation: Secondary | ICD-10-CM

## 2020-04-26 DIAGNOSIS — I480 Paroxysmal atrial fibrillation: Secondary | ICD-10-CM

## 2020-04-26 DIAGNOSIS — I2510 Atherosclerotic heart disease of native coronary artery without angina pectoris: Secondary | ICD-10-CM

## 2020-04-26 DIAGNOSIS — E119 Type 2 diabetes mellitus without complications: Secondary | ICD-10-CM

## 2020-04-26 DIAGNOSIS — I25110 Atherosclerotic heart disease of native coronary artery with unstable angina pectoris: Secondary | ICD-10-CM

## 2020-04-26 DIAGNOSIS — I200 Unstable angina: Secondary | ICD-10-CM

## 2020-04-26 DIAGNOSIS — E785 Hyperlipidemia, unspecified: Secondary | ICD-10-CM

## 2020-04-26 DIAGNOSIS — Z01810 Encounter for preprocedural cardiovascular examination: Secondary | ICD-10-CM

## 2020-04-26 HISTORY — PX: LEFT HEART CATH AND CORONARY ANGIOGRAPHY: CATH118249

## 2020-04-26 LAB — PROTIME-INR
INR: 1.1 (ref 0.8–1.2)
INR: 1.2 (ref 0.8–1.2)
Prothrombin Time: 13.6 seconds (ref 11.4–15.2)
Prothrombin Time: 14.6 seconds (ref 11.4–15.2)

## 2020-04-26 LAB — GLUCOSE, CAPILLARY
Glucose-Capillary: 142 mg/dL — ABNORMAL HIGH (ref 70–99)
Glucose-Capillary: 177 mg/dL — ABNORMAL HIGH (ref 70–99)
Glucose-Capillary: 186 mg/dL — ABNORMAL HIGH (ref 70–99)

## 2020-04-26 LAB — TROPONIN I (HIGH SENSITIVITY)
Troponin I (High Sensitivity): 4 ng/L (ref <18)
Troponin I (High Sensitivity): 5 ng/L (ref <18)

## 2020-04-26 LAB — PULMONARY FUNCTION TEST
FEF 25-75 Pre: 1.07 L/sec
FEF2575-%Pred-Pre: 65 %
FEV1-%Pred-Pre: 56 %
FEV1-Pre: 1.12 L
FEV1FVC-%Pred-Pre: 108 %
FEV6-%Pred-Pre: 54 %
FEV6-Pre: 1.37 L
FEV6FVC-%Pred-Pre: 105 %
FVC-%Pred-Pre: 51 %
FVC-Pre: 1.37 L
Pre FEV1/FVC ratio: 82 %
Pre FEV6/FVC Ratio: 100 %

## 2020-04-26 LAB — CBC
HCT: 39.7 % (ref 36.0–46.0)
Hemoglobin: 12 g/dL (ref 12.0–15.0)
MCH: 25.4 pg — ABNORMAL LOW (ref 26.0–34.0)
MCHC: 30.2 g/dL (ref 30.0–36.0)
MCV: 83.9 fL (ref 80.0–100.0)
Platelets: 199 K/uL (ref 150–400)
RBC: 4.73 MIL/uL (ref 3.87–5.11)
RDW: 14.1 % (ref 11.5–15.5)
WBC: 9.7 K/uL (ref 4.0–10.5)
nRBC: 0 % (ref 0.0–0.2)

## 2020-04-26 LAB — BASIC METABOLIC PANEL
Anion gap: 10 (ref 5–15)
BUN: 11 mg/dL (ref 8–23)
CO2: 23 mmol/L (ref 22–32)
Calcium: 9.7 mg/dL (ref 8.9–10.3)
Chloride: 108 mmol/L (ref 98–111)
Creatinine, Ser: 0.74 mg/dL (ref 0.44–1.00)
GFR, Estimated: >60 mL/min (ref >60)
Glucose, Bld: 134 mg/dL — ABNORMAL HIGH (ref 70–99)
Potassium: 3.6 mmol/L (ref 3.5–5.1)
Sodium: 141 mmol/L (ref 135–145)

## 2020-04-26 LAB — HEMOGLOBIN A1C
Hgb A1c MFr Bld: 7.5 % — ABNORMAL HIGH (ref 4.8–5.6)
Hgb A1c MFr Bld: 7.5 % — ABNORMAL HIGH (ref 4.8–5.6)
Mean Plasma Glucose: 168.55 mg/dL
Mean Plasma Glucose: 168.55 mg/dL

## 2020-04-26 LAB — URINALYSIS, ROUTINE W REFLEX MICROSCOPIC
Bilirubin Urine: NEGATIVE
Glucose, UA: NEGATIVE mg/dL
Ketones, ur: NEGATIVE mg/dL
Nitrite: NEGATIVE
Protein, ur: NEGATIVE mg/dL
Specific Gravity, Urine: 1.004 — ABNORMAL LOW (ref 1.005–1.030)
pH: 6 (ref 5.0–8.0)

## 2020-04-26 LAB — RESPIRATORY PANEL BY RT PCR (FLU A&B, COVID)
Influenza A by PCR: NEGATIVE
Influenza B by PCR: NEGATIVE
SARS Coronavirus 2 by RT PCR: NEGATIVE

## 2020-04-26 LAB — SURGICAL PCR SCREEN
MRSA, PCR: POSITIVE — AB
Staphylococcus aureus: POSITIVE — AB

## 2020-04-26 LAB — CBG MONITORING, ED
Glucose-Capillary: 121 mg/dL — ABNORMAL HIGH (ref 70–99)
Glucose-Capillary: 140 mg/dL — ABNORMAL HIGH (ref 70–99)

## 2020-04-26 LAB — TSH: TSH: 5.542 u[IU]/mL — ABNORMAL HIGH (ref 0.350–4.500)

## 2020-04-26 LAB — LIPID PANEL
Cholesterol: 138 mg/dL (ref 0–200)
HDL: 37 mg/dL — ABNORMAL LOW
LDL Cholesterol: 82 mg/dL (ref 0–99)
Total CHOL/HDL Ratio: 3.7 ratio
Triglycerides: 93 mg/dL
VLDL: 19 mg/dL (ref 0–40)

## 2020-04-26 LAB — HEPARIN LEVEL (UNFRACTIONATED)
Heparin Unfractionated: 0.33 IU/mL (ref 0.30–0.70)
Heparin Unfractionated: <0.1 [IU]/mL — ABNORMAL LOW (ref 0.30–0.70)

## 2020-04-26 LAB — APTT: aPTT: 33 seconds (ref 24–36)

## 2020-04-26 LAB — BRAIN NATRIURETIC PEPTIDE: B Natriuretic Peptide: 34.7 pg/mL (ref 0.0–100.0)

## 2020-04-26 SURGERY — LEFT HEART CATH AND CORONARY ANGIOGRAPHY
Anesthesia: LOCAL

## 2020-04-26 MED ORDER — HEPARIN (PORCINE) 25000 UT/250ML-% IV SOLN
850.0000 [IU]/h | INTRAVENOUS | Status: DC
  Administered 2020-04-26 (×2): 850 [IU]/h via INTRAVENOUS

## 2020-04-26 MED ORDER — CHLORHEXIDINE GLUCONATE CLOTH 2 % EX PADS
6.0000 | MEDICATED_PAD | Freq: Once | CUTANEOUS | Status: AC
  Administered 2020-04-26: 6 via TOPICAL

## 2020-04-26 MED ORDER — TEMAZEPAM 15 MG PO CAPS
15.0000 mg | ORAL_CAPSULE | Freq: Once | ORAL | Status: AC | PRN
  Administered 2020-04-26: 15 mg via ORAL
  Filled 2020-04-26: qty 1

## 2020-04-26 MED ORDER — LORAZEPAM 0.5 MG PO TABS
0.2500 mg | ORAL_TABLET | Freq: Three times a day (TID) | ORAL | Status: DC | PRN
  Administered 2020-04-26 (×2): 0.25 mg via ORAL
  Filled 2020-04-26: qty 1

## 2020-04-26 MED ORDER — HEPARIN (PORCINE) IN NACL 1000-0.9 UT/500ML-% IV SOLN
INTRAVENOUS | Status: DC | PRN
  Administered 2020-04-26 (×2): 500 mL

## 2020-04-26 MED ORDER — TRANEXAMIC ACID 1000 MG/10ML IV SOLN
1.5000 mg/kg/h | INTRAVENOUS | Status: AC
  Administered 2020-04-27: 1.5 mg/kg/h via INTRAVENOUS
  Filled 2020-04-26: qty 25

## 2020-04-26 MED ORDER — HEPARIN (PORCINE) IN NACL 1000-0.9 UT/500ML-% IV SOLN
INTRAVENOUS | Status: AC
  Filled 2020-04-26: qty 1000

## 2020-04-26 MED ORDER — ASPIRIN 81 MG PO CHEW
CHEWABLE_TABLET | ORAL | Status: AC
  Filled 2020-04-26: qty 1

## 2020-04-26 MED ORDER — ASPIRIN 81 MG PO CHEW: 81.0000 mg | CHEWABLE_TABLET | ORAL | Status: DC

## 2020-04-26 MED ORDER — SODIUM CHLORIDE 0.9 % IV SOLN
INTRAVENOUS | Status: DC
  Filled 2020-04-26: qty 30

## 2020-04-26 MED ORDER — HYDRALAZINE HCL 20 MG/ML IJ SOLN: 10.0000 mg | INTRAMUSCULAR | Status: AC | PRN

## 2020-04-26 MED ORDER — SODIUM CHLORIDE 0.9% FLUSH
3.0000 mL | Freq: Two times a day (BID) | INTRAVENOUS | Status: DC
  Administered 2020-04-26: 3 mL via INTRAVENOUS

## 2020-04-26 MED ORDER — VANCOMYCIN HCL 1250 MG/250ML IV SOLN
1250.0000 mg | INTRAVENOUS | Status: AC
  Administered 2020-04-27: 1250 mg via INTRAVENOUS
  Filled 2020-04-26: qty 250

## 2020-04-26 MED ORDER — HEPARIN SODIUM (PORCINE) 1000 UNIT/ML IJ SOLN
INTRAMUSCULAR | Status: AC
  Filled 2020-04-26: qty 1

## 2020-04-26 MED ORDER — MIDAZOLAM HCL 2 MG/2ML IJ SOLN
INTRAMUSCULAR | Status: AC
  Filled 2020-04-26: qty 2

## 2020-04-26 MED ORDER — EPINEPHRINE HCL 5 MG/250ML IV SOLN IN NS
0.0000 ug/min | INTRAVENOUS | Status: AC
  Administered 2020-04-27: 13:00:00 2 ug/min via INTRAVENOUS
  Filled 2020-04-26: qty 250

## 2020-04-26 MED ORDER — MUPIROCIN 2 % EX OINT
TOPICAL_OINTMENT | Freq: Two times a day (BID) | CUTANEOUS | Status: DC
  Administered 2020-04-27: 1 via NASAL
  Filled 2020-04-26: qty 22

## 2020-04-26 MED ORDER — MAGNESIUM SULFATE 50 % IJ SOLN
40.0000 meq | INTRAMUSCULAR | Status: DC
  Filled 2020-04-26: qty 9.85

## 2020-04-26 MED ORDER — SODIUM CHLORIDE 0.9 % WEIGHT BASED INFUSION
1.0000 mL/kg/h | INTRAVENOUS | Status: DC
  Administered 2020-04-26: 1 mL/kg/h via INTRAVENOUS

## 2020-04-26 MED ORDER — SODIUM CHLORIDE 0.9 % IV SOLN: 250.0000 mL | INTRAVENOUS | Status: DC | PRN

## 2020-04-26 MED ORDER — VERAPAMIL HCL 2.5 MG/ML IV SOLN
INTRAVENOUS | Status: DC | PRN
  Administered 2020-04-26: 10 mL via INTRA_ARTERIAL

## 2020-04-26 MED ORDER — POTASSIUM CHLORIDE 2 MEQ/ML IV SOLN
80.0000 meq | INTRAVENOUS | Status: DC
  Filled 2020-04-26: qty 40

## 2020-04-26 MED ORDER — MIDAZOLAM HCL 2 MG/2ML IJ SOLN
INTRAMUSCULAR | Status: DC | PRN
  Administered 2020-04-26 (×2): 1 mg via INTRAVENOUS

## 2020-04-26 MED ORDER — PHENYLEPHRINE HCL-NACL 20-0.9 MG/250ML-% IV SOLN
30.0000 ug/min | INTRAVENOUS | Status: AC
  Administered 2020-04-27: 20 ug/min via INTRAVENOUS
  Filled 2020-04-26: qty 250

## 2020-04-26 MED ORDER — IOHEXOL 350 MG/ML SOLN
INTRAVENOUS | Status: DC | PRN
  Administered 2020-04-26: 60 mL

## 2020-04-26 MED ORDER — FENTANYL CITRATE (PF) 100 MCG/2ML IJ SOLN
INTRAMUSCULAR | Status: AC
  Filled 2020-04-26: qty 2

## 2020-04-26 MED ORDER — HEPARIN BOLUS VIA INFUSION
3000.0000 [IU] | Freq: Once | INTRAVENOUS | Status: AC
  Administered 2020-04-26: 3000 [IU] via INTRAVENOUS
  Filled 2020-04-26: qty 3000

## 2020-04-26 MED ORDER — NITROGLYCERIN IN D5W 200-5 MCG/ML-% IV SOLN
5.0000 ug/min | INTRAVENOUS | Status: DC
  Administered 2020-04-26: 10 ug/min via INTRAVENOUS

## 2020-04-26 MED ORDER — MILRINONE LACTATE IN DEXTROSE 20-5 MG/100ML-% IV SOLN
0.3000 ug/kg/min | INTRAVENOUS | Status: DC
  Filled 2020-04-26: qty 100

## 2020-04-26 MED ORDER — NOREPINEPHRINE 4 MG/250ML-% IV SOLN
0.0000 ug/min | INTRAVENOUS | Status: DC
  Filled 2020-04-26: qty 250

## 2020-04-26 MED ORDER — HEPARIN SODIUM (PORCINE) 1000 UNIT/ML IJ SOLN
INTRAMUSCULAR | Status: DC | PRN
  Administered 2020-04-26: 3500 [IU] via INTRAVENOUS

## 2020-04-26 MED ORDER — SODIUM CHLORIDE 0.9 % IV SOLN
750.0000 mg | INTRAVENOUS | Status: AC
  Administered 2020-04-27: 750 mg via INTRAVENOUS
  Filled 2020-04-26: qty 750

## 2020-04-26 MED ORDER — LABETALOL HCL 5 MG/ML IV SOLN: 10.0000 mg | INTRAVENOUS | Status: AC | PRN

## 2020-04-26 MED ORDER — SODIUM CHLORIDE 0.9 % WEIGHT BASED INFUSION: 1.0000 mL/kg/h | INTRAVENOUS | Status: AC

## 2020-04-26 MED ORDER — SODIUM CHLORIDE 0.9 % WEIGHT BASED INFUSION: 3.0000 mL/kg/h | INTRAVENOUS | Status: DC

## 2020-04-26 MED ORDER — INSULIN REGULAR(HUMAN) IN NACL 100-0.9 UT/100ML-% IV SOLN
INTRAVENOUS | Status: AC
  Administered 2020-04-27: 2.8 [IU]/h via INTRAVENOUS
  Filled 2020-04-26: qty 100

## 2020-04-26 MED ORDER — LIDOCAINE HCL (PF) 1 % IJ SOLN
INTRAMUSCULAR | Status: DC | PRN
  Administered 2020-04-26: 2 mL

## 2020-04-26 MED ORDER — SODIUM CHLORIDE 0.9% FLUSH: 3.0000 mL | INTRAVENOUS | Status: DC | PRN

## 2020-04-26 MED ORDER — METOPROLOL TARTRATE 12.5 MG HALF TABLET
12.5000 mg | ORAL_TABLET | Freq: Once | ORAL | Status: AC
  Administered 2020-04-27: 12.5 mg via ORAL
  Filled 2020-04-26: qty 1

## 2020-04-26 MED ORDER — BISACODYL 5 MG PO TBEC
5.0000 mg | DELAYED_RELEASE_TABLET | Freq: Once | ORAL | Status: AC
  Administered 2020-04-26: 5 mg via ORAL
  Filled 2020-04-26: qty 1

## 2020-04-26 MED ORDER — MORPHINE SULFATE (PF) 4 MG/ML IV SOLN
INTRAVENOUS | Status: AC
  Filled 2020-04-26: qty 1

## 2020-04-26 MED ORDER — SODIUM CHLORIDE 0.9 % IV SOLN
1.5000 g | INTRAVENOUS | Status: AC
  Administered 2020-04-27: 1.5 g via INTRAVENOUS
  Filled 2020-04-26 (×3): qty 1.5

## 2020-04-26 MED ORDER — ROSUVASTATIN CALCIUM 20 MG PO TABS: 40.0000 mg | ORAL_TABLET | Freq: Every day | ORAL | Status: DC

## 2020-04-26 MED ORDER — ASPIRIN 81 MG PO CHEW
CHEWABLE_TABLET | ORAL | Status: DC | PRN
  Administered 2020-04-26: 81 mg via ORAL

## 2020-04-26 MED ORDER — NITROGLYCERIN IN D5W 200-5 MCG/ML-% IV SOLN
INTRAVENOUS | Status: AC
  Filled 2020-04-26: qty 250

## 2020-04-26 MED ORDER — FENTANYL CITRATE (PF) 100 MCG/2ML IJ SOLN
INTRAMUSCULAR | Status: DC | PRN
  Administered 2020-04-26 (×2): 25 ug via INTRAVENOUS

## 2020-04-26 MED ORDER — VERAPAMIL HCL 2.5 MG/ML IV SOLN
INTRAVENOUS | Status: AC
  Filled 2020-04-26: qty 2

## 2020-04-26 MED ORDER — DEXMEDETOMIDINE HCL IN NACL 400 MCG/100ML IV SOLN
0.1000 ug/kg/h | INTRAVENOUS | Status: AC
  Administered 2020-04-27: .5 ug/kg/h via INTRAVENOUS
  Filled 2020-04-26: qty 100

## 2020-04-26 MED ORDER — PLASMA-LYTE 148 IV SOLN
INTRAVENOUS | Status: DC
  Filled 2020-04-26: qty 2.5

## 2020-04-26 MED ORDER — LORAZEPAM 0.5 MG PO TABS
ORAL_TABLET | ORAL | Status: AC
  Filled 2020-04-26: qty 1

## 2020-04-26 MED ORDER — MORPHINE SULFATE (PF) 4 MG/ML IV SOLN
2.0000 mg | INTRAVENOUS | Status: DC | PRN
  Administered 2020-04-26: 2 mg via INTRAVENOUS

## 2020-04-26 MED ORDER — CHLORHEXIDINE GLUCONATE 0.12 % MT SOLN
15.0000 mL | Freq: Once | OROMUCOSAL | Status: AC
  Administered 2020-04-27: 15 mL via OROMUCOSAL
  Filled 2020-04-26: qty 15

## 2020-04-26 MED ORDER — HEPARIN (PORCINE) 25000 UT/250ML-% IV SOLN
850.0000 [IU]/h | INTRAVENOUS | Status: DC
  Administered 2020-04-26: 850 [IU]/h via INTRAVENOUS
  Filled 2020-04-26: qty 250

## 2020-04-26 MED ORDER — LIDOCAINE HCL (PF) 1 % IJ SOLN
INTRAMUSCULAR | Status: AC
  Filled 2020-04-26: qty 30

## 2020-04-26 MED ORDER — TRANEXAMIC ACID (OHS) BOLUS VIA INFUSION
15.0000 mg/kg | INTRAVENOUS | Status: AC
  Administered 2020-04-27: 1008 mg via INTRAVENOUS
  Filled 2020-04-26: qty 1008

## 2020-04-26 MED ORDER — NITROGLYCERIN IN D5W 200-5 MCG/ML-% IV SOLN
2.0000 ug/min | INTRAVENOUS | Status: AC
  Administered 2020-04-27: 16.6 ug/min via INTRAVENOUS
  Filled 2020-04-26: qty 250

## 2020-04-26 MED ORDER — TRANEXAMIC ACID (OHS) PUMP PRIME SOLUTION
2.0000 mg/kg | INTRAVENOUS | Status: DC
  Filled 2020-04-26: qty 1.34

## 2020-04-26 SURGICAL SUPPLY — 11 items
CATH 5FR JL3.5 JR4 ANG PIG MP (CATHETERS) ×1 IMPLANT
DEVICE RAD COMP TR BAND LRG (VASCULAR PRODUCTS) ×1 IMPLANT
GLIDESHEATH SLEND SS 6F .021 (SHEATH) ×1 IMPLANT
GUIDEWIRE INQWIRE 1.5J.035X260 (WIRE) IMPLANT
INQWIRE 1.5J .035X260CM (WIRE) ×2
KIT HEART LEFT (KITS) ×2 IMPLANT
PACK CARDIAC CATHETERIZATION (CUSTOM PROCEDURE TRAY) ×2 IMPLANT
SHEATH PROBE COVER 6X72 (BAG) ×1 IMPLANT
TRANSDUCER W/STOPCOCK (MISCELLANEOUS) ×2 IMPLANT
TUBING ART PRESS 72  MALE/MALE (TUBING) ×1 IMPLANT
TUBING CIL FLEX 10 FLL-RA (TUBING) ×2 IMPLANT

## 2020-04-26 NOTE — Progress Notes (Signed)
Lower extremity venous has been completed. Preliminary results can be found in CV Proc through chart review.   04/26/20 3:41 PM [place]

## 2020-04-26 NOTE — Progress Notes (Addendum)
Progress Note  Patient Name: Katrina Castro Date of Encounter: 04/26/2020  [facility] Cardiologist: Dorris Carnes, MD   Subjective   Mild chest pressure and headache this morning.   Inpatient Medications    Scheduled Meds: . acyclovir  400 mg Oral Daily  . aspirin  81 mg Oral Pre-Cath  . aspirin EC  81 mg Oral Daily  . insulin aspart  0-5 Units Subcutaneous QHS  . insulin aspart  0-9 Units Subcutaneous TID WC  . isosorbide dinitrate  10 mg Oral BID  . loratadine  10 mg Oral Daily  . metoprolol tartrate  50 mg Oral BID  . oxybutynin  10 mg Oral Daily  . pantoprazole  40 mg Oral Daily  . rosuvastatin  40 mg Oral Daily   Continuous Infusions: . sodium chloride     Followed by  . sodium chloride    . heparin 850 Units/hr (04/26/20 0126)   PRN Meds: acetaminophen, ALPRAZolam, nitroGLYCERIN, ondansetron (ZOFRAN) IV   Vital Signs    Vitals:   04/26/20 0515 04/26/20 0600 04/26/20 0730 04/26/20 0736  BP: (!) 104/58 (!) 106/53 (!) 102/49   Pulse: 72 63 (!) 59   Resp: 12 20 (!) 21   Temp: 98.2 F (36.8 C)   97.9 F (36.6 C)  TempSrc: Oral   Oral  SpO2: 99% 95% 95%   Weight:      Height:       No intake or output data in the 24 hours ending 04/26/20 0807 Last 3 Weights 04/25/2020 03/31/2020 03/15/2020  Weight (lbs) 148 lb 2.4 oz 148 lb 3.2 oz 146 lb  Weight (kg) 67.2 kg 67.223 kg 66.225 kg      Telemetry    SR - Personally Reviewed  ECG    SR with nonspecific T wave changes - Personally Reviewed  Physical Exam  Pleasant older female GEN: No acute distress.   Neck: No JVD Cardiac: RRR, no murmurs, rubs, or gallops.  Respiratory: Clear to auscultation bilaterally. GI: Soft, nontender, non-distended  MS: No edema; No deformity. Neuro:  Nonfocal  Psych: Normal affect   Labs    High Sensitivity Troponin:   Recent Labs  Lab 04/25/20 2058 04/25/20 2341 04/26/20 0145  TROPONINIHS 4 4 5       Chemistry Recent Labs  Lab 04/25/20 2058  04/26/20 0145  NA 138 141  K 3.7 3.6  CL 104 108  CO2 24 23  GLUCOSE 161* 134*  BUN 10 11  CREATININE 0.82 0.74  CALCIUM 9.7 9.7  GFRNONAA >60 >60  ANIONGAP 10 10     Hematology Recent Labs  Lab 04/25/20 2058 04/26/20 0250  WBC 10.7* 9.7  RBC 4.85 4.73  HGB 12.5 12.0  HCT 41.4 39.7  MCV 85.4 83.9  MCH 25.8* 25.4*  MCHC 30.2 30.2  RDW 13.9 14.1  PLT 317 199    BNP Recent Labs  Lab 04/25/20 2341  BNP 34.7     DDimer No results for input(s): DDIMER in the last 168 hours.   Radiology    DG Chest 2 View  Result Date: 04/25/2020 CLINICAL DATA:  Chest pain for 1 day, reflux, hypertension EXAM: CHEST - 2 VIEW COMPARISON:  03/24/2020 FINDINGS: Frontal and lateral views of the chest demonstrate an unremarkable cardiac silhouette. Small hiatal hernia. No airspace disease, effusion, or pneumothorax. Mild bibasilar scarring unchanged. No acute bony abnormalities. IMPRESSION: 1. No acute intrathoracic process. 2. Hiatal hernia. Electronically Signed   By: Diana Eves.D.  On: 04/25/2020 21:12    Cardiac Studies   Echo: 03/17/20  IMPRESSIONS    1. Left ventricular ejection fraction, by estimation, is 55 to 60%. The  left ventricle has normal function. The left ventricle has no regional  wall motion abnormalities. There is mild left ventricular hypertrophy.  Left ventricular diastolic parameters  are indeterminate.  2. Right ventricular systolic function is normal. The right ventricular  size is normal. Tricuspid regurgitation signal is inadequate for assessing  PA pressure.  3. The mitral valve is grossly normal. Mild mitral valve regurgitation.  4. The aortic valve is tricuspid. Aortic valve regurgitation is not  visualized.  5. The inferior vena cava is normal in size with <50% respiratory  variability, suggesting right atrial pressure of 8 mmHg.   Patient Profile     74 y.o. female with PMH of PAF, esophageal dysmotility, DM2, and HTN who presented to  the ED after an abnormal CCTA with unstable angina.   Assessment & Plan    1.  Unstable angina: Has been seen in the office for ongoing symptoms by Dr. Harrington Challenger.  Underwent outpatient coronary CTA which was positive for left main and proximal LAD disease.  She was called and asked to come to the ER, with plans to undergo cardiac catheterization.  Still with some mild chest heaviness while in the ED.  High-sensitivity troponin negative. --Continue IV heparin, aspirin, statin, beta-blocker and Imdur --Reports last dose of Xarelto was at least 2 days prior to admission. -- The patient understands that risks included but are not limited to stroke (1 in 1000), death (1 in 1), kidney failure [usually temporary] (1 in 500), bleeding (1 in 200), allergic reaction [possibly serious] (1 in 200).   2.  PAF: On metoprolol, Xarelto.  Patient does note that she is sporadic with her Xarelto at home.  Will need to further discuss with patient post-cath regarding need for compliance.  3.  Diabetes: On glimepiride, Metformin prior to admission --HgbA1c 7.5 --Sliding scale while inpatient --May be a good candidate for SGLT2  4.  Hyperlipidemia: LDL 82 --Will increase home Crestor 10 mg to 40 mg daily   For questions or updates, please contact [place] Please consult [email or web address] for contact info under        Signed, Reino Bellis, NP  04/26/2020, 8:07 AM     Patient seen and examined. Agree with assessment and plan.  Katrina Castro is a 74 year old female who has a history of paroxysmal atrial fibrillation and has been on Xarelto for which she has been only taking every other day but not consistently.  Her last dose of Xarelto was 2 days prior to admission.  She also has a history of type 2 diabetes mellitus.  Recently, she had developed chest tightness and a sensation of smothering while walking.  In addition she also has noticed more constant chest pain most likely of chest wall etiology.  She  underwent coronary CTA which was notable for a calcium score of 122 and suggested significant distal left main and proximal LAD stenoses.  FFR was negative in the left main but suggested occlusion of the proximal LAD and diagonal.  Patient presented to the emergency room last evening after being notified of the result.  Presently her blood pressure is 105/60.  There is no JVD.  Her lungs are clear.  She has definite  chest wall tenderness along her lower left costochondral region which is most likely costochondral/musculoskeletal pain and this pain is different from  the exertional chest pressure and smothering sensation when she walks.  Abdomen is soft and nontender.  Pulses are 2+.  There is no edema.  She is now on IV heparin therapy.  Laboratories notable for negative high-sensitivity troponin.  Potassium is 3.6.  Renal function stable with a BUN of 11 creatinine 0.74.  CBC is stable.  Lipid studies are elevated with total cholesterol 138  and LDL cholesterol 82 (improved from 144).  We discussed the need for cardiac catheterization which will be done the next case this morning.  The patient is aware of risk benefits of the procedure as noted above.  I discussed the catheterization and potential scenarios following delineation of her coronary anatomy.  We will increase rosuvastatin to 40 mg daily.  Plan catheterization this morning.   Troy Sine, MD, [facility] 04/26/2020 8:16 AM

## 2020-04-26 NOTE — H&P (View-Only) (Signed)
Progress Note  Patient Name: Katrina Castro Date of Encounter: 04/26/2020  [facility] Cardiologist: Dorris Carnes, MD   Subjective   Mild chest pressure and headache this morning.   Inpatient Medications    Scheduled Meds: . acyclovir  400 mg Oral Daily  . aspirin  81 mg Oral Pre-Cath  . aspirin EC  81 mg Oral Daily  . insulin aspart  0-5 Units Subcutaneous QHS  . insulin aspart  0-9 Units Subcutaneous TID WC  . isosorbide dinitrate  10 mg Oral BID  . loratadine  10 mg Oral Daily  . metoprolol tartrate  50 mg Oral BID  . oxybutynin  10 mg Oral Daily  . pantoprazole  40 mg Oral Daily  . rosuvastatin  40 mg Oral Daily   Continuous Infusions: . sodium chloride     Followed by  . sodium chloride    . heparin 850 Units/hr (04/26/20 0126)   PRN Meds: acetaminophen, ALPRAZolam, nitroGLYCERIN, ondansetron (ZOFRAN) IV   Vital Signs    Vitals:   04/26/20 0515 04/26/20 0600 04/26/20 0730 04/26/20 0736  BP: (!) 104/58 (!) 106/53 (!) 102/49   Pulse: 72 63 (!) 59   Resp: 12 20 (!) 21   Temp: 98.2 F (36.8 C)   97.9 F (36.6 C)  TempSrc: Oral   Oral  SpO2: 99% 95% 95%   Weight:      Height:       No intake or output data in the 24 hours ending 04/26/20 0807 Last 3 Weights 04/25/2020 03/31/2020 03/15/2020  Weight (lbs) 148 lb 2.4 oz 148 lb 3.2 oz 146 lb  Weight (kg) 67.2 kg 67.223 kg 66.225 kg      Telemetry    SR - Personally Reviewed  ECG    SR with nonspecific T wave changes - Personally Reviewed  Physical Exam  Pleasant older female GEN: No acute distress.   Neck: No JVD Cardiac: RRR, no murmurs, rubs, or gallops.  Respiratory: Clear to auscultation bilaterally. GI: Soft, nontender, non-distended  MS: No edema; No deformity. Neuro:  Nonfocal  Psych: Normal affect   Labs    High Sensitivity Troponin:   Recent Labs  Lab 04/25/20 2058 04/25/20 2341 04/26/20 0145  TROPONINIHS 4 4 5       Chemistry Recent Labs  Lab 04/25/20 2058  04/26/20 0145  NA 138 141  K 3.7 3.6  CL 104 108  CO2 24 23  GLUCOSE 161* 134*  BUN 10 11  CREATININE 0.82 0.74  CALCIUM 9.7 9.7  GFRNONAA >60 >60  ANIONGAP 10 10     Hematology Recent Labs  Lab 04/25/20 2058 04/26/20 0250  WBC 10.7* 9.7  RBC 4.85 4.73  HGB 12.5 12.0  HCT 41.4 39.7  MCV 85.4 83.9  MCH 25.8* 25.4*  MCHC 30.2 30.2  RDW 13.9 14.1  PLT 317 199    BNP Recent Labs  Lab 04/25/20 2341  BNP 34.7     DDimer No results for input(s): DDIMER in the last 168 hours.   Radiology    DG Chest 2 View  Result Date: 04/25/2020 CLINICAL DATA:  Chest pain for 1 day, reflux, hypertension EXAM: CHEST - 2 VIEW COMPARISON:  03/24/2020 FINDINGS: Frontal and lateral views of the chest demonstrate an unremarkable cardiac silhouette. Small hiatal hernia. No airspace disease, effusion, or pneumothorax. Mild bibasilar scarring unchanged. No acute bony abnormalities. IMPRESSION: 1. No acute intrathoracic process. 2. Hiatal hernia. Electronically Signed   By: Diana Eves.D.  On: 04/25/2020 21:12    Cardiac Studies   Echo: 03/17/20  IMPRESSIONS    1. Left ventricular ejection fraction, by estimation, is 55 to 60%. The  left ventricle has normal function. The left ventricle has no regional  wall motion abnormalities. There is mild left ventricular hypertrophy.  Left ventricular diastolic parameters  are indeterminate.  2. Right ventricular systolic function is normal. The right ventricular  size is normal. Tricuspid regurgitation signal is inadequate for assessing  PA pressure.  3. The mitral valve is grossly normal. Mild mitral valve regurgitation.  4. The aortic valve is tricuspid. Aortic valve regurgitation is not  visualized.  5. The inferior vena cava is normal in size with <50% respiratory  variability, suggesting right atrial pressure of 8 mmHg.   Patient Profile     74 y.o. female with PMH of PAF, esophageal dysmotility, DM2, and HTN who presented to  the ED after an abnormal CCTA with unstable angina.   Assessment & Plan    1.  Unstable angina: Has been seen in the office for ongoing symptoms by Dr. Harrington Challenger.  Underwent outpatient coronary CTA which was positive for left main and proximal LAD disease.  She was called and asked to come to the ER, with plans to undergo cardiac catheterization.  Still with some mild chest heaviness while in the ED.  High-sensitivity troponin negative. --Continue IV heparin, aspirin, statin, beta-blocker and Imdur --Reports last dose of Xarelto was at least 2 days prior to admission. -- The patient understands that risks included but are not limited to stroke (1 in 1000), death (1 in 57), kidney failure [usually temporary] (1 in 500), bleeding (1 in 200), allergic reaction [possibly serious] (1 in 200).   2.  PAF: On metoprolol, Xarelto.  Patient does note that she is sporadic with her Xarelto at home.  Will need to further discuss with patient post-cath regarding need for compliance.  3.  Diabetes: On glimepiride, Metformin prior to admission --HgbA1c 7.5 --Sliding scale while inpatient --May be a good candidate for SGLT2  4.  Hyperlipidemia: LDL 82 --Will increase home Crestor 10 mg to 40 mg daily   For questions or updates, please contact [place] Please consult [email or web address] for contact info under        Signed, Reino Bellis, NP  04/26/2020, 8:07 AM     Patient seen and examined. Agree with assessment and plan.  Katrina Castro is a 74 year old female who has a history of paroxysmal atrial fibrillation and has been on Xarelto for which she has been only taking every other day but not consistently.  Her last dose of Xarelto was 2 days prior to admission.  She also has a history of type 2 diabetes mellitus.  Recently, she had developed chest tightness and a sensation of smothering while walking.  In addition she also has noticed more constant chest pain most likely of chest wall etiology.  She  underwent coronary CTA which was notable for a calcium score of 122 and suggested significant distal left main and proximal LAD stenoses.  FFR was negative in the left main but suggested occlusion of the proximal LAD and diagonal.  Patient presented to the emergency room last evening after being notified of the result.  Presently her blood pressure is 105/60.  There is no JVD.  Her lungs are clear.  She has definite  chest wall tenderness along her lower left costochondral region which is most likely costochondral/musculoskeletal pain and this pain is different from  the exertional chest pressure and smothering sensation when she walks.  Abdomen is soft and nontender.  Pulses are 2+.  There is no edema.  She is now on IV heparin therapy.  Laboratories notable for negative high-sensitivity troponin.  Potassium is 3.6.  Renal function stable with a BUN of 11 creatinine 0.74.  CBC is stable.  Lipid studies are elevated with total cholesterol 138  and LDL cholesterol 82 (improved from 144).  We discussed the need for cardiac catheterization which will be done the next case this morning.  The patient is aware of risk benefits of the procedure as noted above.  I discussed the catheterization and potential scenarios following delineation of her coronary anatomy.  We will increase rosuvastatin to 40 mg daily.  Plan catheterization this morning.   Troy Sine, MD, [facility] 04/26/2020 8:16 AM

## 2020-04-26 NOTE — ED Notes (Signed)
Pt transported to cath lab.  

## 2020-04-26 NOTE — Anesthesia Preprocedure Evaluation (Addendum)
Anesthesia Evaluation  Patient identified by MRN, date of birth, ID band Patient awake    Reviewed: Allergy & Precautions, NPO status , Patient's Chart, lab work & pertinent test results, reviewed documented beta blocker date and time   History of Anesthesia Complications Negative for: history of anesthetic complications  Airway Mallampati: II  TM Distance: >3 FB Neck ROM: Full    Dental  (+) Edentulous Upper, Missing, Dental Advisory Given   Pulmonary  04/25/2020 SARS coronavirus NEG   breath sounds clear to auscultation       Cardiovascular hypertension, Pt. on medications and Pt. on home beta blockers + angina with exertion + CAD (severe 3v ASCADz)  + dysrhythmias Atrial Fibrillation  Rhythm:Regular Rate:Normal  02/2020 ECHO: EF 55-60%, mild MR   Neuro/Psych negative neurological ROS     GI/Hepatic Neg liver ROS, GERD  Medicated and Controlled,  Endo/Other  diabetes (glu 151), Oral Hypoglycemic Agents  Renal/GU negative Renal ROS     Musculoskeletal   Abdominal   Peds  Hematology negative hematology ROS (+)   Anesthesia Other Findings   Reproductive/Obstetrics                           Anesthesia Physical Anesthesia Plan  ASA: III  Anesthesia Plan: General   Post-op Pain Management:    Induction: Intravenous  PONV Risk Score and Plan: 3 and Treatment may vary due to age or medical condition  Airway Management Planned: Oral ETT  Additional Equipment: Arterial line, PA Cath, TEE and Ultrasound Guidance Line Placement  Intra-op Plan:   Post-operative Plan: Post-operative intubation/ventilation  Informed Consent: I have reviewed the patients History and Physical, chart, labs and discussed the procedure including the risks, benefits and alternatives for the proposed anesthesia with the patient or authorized representative who has indicated his/her understanding and acceptance.      Dental advisory given  Plan Discussed with: CRNA and Surgeon  Anesthesia Plan Comments:        Anesthesia Quick Evaluation

## 2020-04-26 NOTE — Progress Notes (Signed)
Discussed with pt and daughter sternal precautions, IS (only 250 mL), mobility post op and d/c planning. Pt upset, stating she is not wanting surgery and wanting to work. Daughter calming her and reminding her of practicality. Gave education resources to review if she would like. Pt lying flat but struggled with IS. Encouraged more use when she can be up in recliner later.  Colon, ACSM 2:11 PM  04/26/2020

## 2020-04-26 NOTE — Progress Notes (Signed)
13:00  Received pt from Cathlab, R radial site level 0,pt alert and oriented. Daughter at bedside. Pt complaining of headache, PRN meds given.

## 2020-04-26 NOTE — Consult Note (Addendum)
[address]       [place] [address]             [id number]        Katrina Castro [facility] Medical Record #[id number] Date of Birth: 1946/01/18  Referring: Dr. Martinique, MD Primary Care: Glenda Chroman, MD Primary Cardiologist:Paula Harrington Challenger, MD  Chief Complaint:    Chief Complaint  Patient presents with  . Chest Pain  Reason for consultation: Coronary artery disease  History of Present Illness:     This is a 74 year old female with a past medical history of paroxsymal atrial fibrillation, type II diabetes mellitus, hypertension, esophageal dysmotility who presented to [facility] ED with complaints of chest pain on 04/25/2020. Per patient, she has chest pain mostly with exertion, it sometimes radiates to her left shoulder, she sometimes has left arm numbness. She also has shortness of breath and diaphoresis with her chest pain. She had an echo done 03/17/2020 that showed LVEF 55-60%, mild MV regurgitation, no pericardial effusion. She then on 04/22/2000 had a CCTA that showed occluded proximal LAD and ostial portion of D1.  She underwent a cardiac catheterization today which showed proximal to mid LAD 75% stenosis, first Diagonal 90% stenosis, OM1 with 95% stenosis, RPDA 90% stenosed, and first RPL with 90% stenosis. Dr. Orvan Seen has been consulted for the consideration of coronary artery bypass grafting surgery. Patient's vital signs are stable at the time of my exam. She recently had an episode of chest pain and felt anxious. She is emotional this afternoon.  Current Activity/ Functional Status: Patient is independent with mobility/ambulation, transfers, ADL's, IADL's.   Zubrod Score: At the time of surgery this patient's most appropriate activity status/level should be described as: []     0    Normal activity, no symptoms [x]     1    Restricted in physical strenuous activity but ambulatory, able to do out light work []     2    Ambulatory and capable of self care,  unable to do work activities, up and about more than 50%  Of the time                            []     3    Only limited self care, in bed greater than 50% of waking hours []     4    Completely disabled, no self care, confined to bed or chair []     5    Moribund  Past Medical History:  Diagnosis Date  . Arthritis   . Concussion    2015  . Depression   . Diabetes ([place])    type 2  . Dysphagia   . Dysrhythmia    afib  . GERD (gastroesophageal reflux disease)   . Headache   . History of bronchitis   . Hypertension   . Seasonal allergies   . Toenail fungus     Past Surgical History:  Procedure Laterality Date  . APPENDECTOMY    . BACK SURGERY     x2  . BOTOX INJECTION N/A 06/06/2016   Procedure: BOTOX INJECTION;  Surgeon: Rogene Houston, MD;  [facility];  Service: Endoscopy;  Laterality: N/A;  . CHOLECYSTECTOMY    . COLONOSCOPY N/A 11/05/2012   Procedure: COLONOSCOPY;  Surgeon: Rogene Houston, MD;  [facility];  Service: Endoscopy;  Laterality: N/A;  1030  .  COLONOSCOPY WITH PROPOFOL N/A 05/01/2019   Procedure: COLONOSCOPY WITH PROPOFOL;  Surgeon: Rogene Houston, MD;  [facility];  Service: Endoscopy;  Laterality: N/A;  11:20am  . ESOPHAGEAL DILATION N/A 07/06/2015   Procedure: ESOPHAGEAL DILATION;  Surgeon: Rogene Houston, MD;  [facility];  Service: Endoscopy;  Laterality: N/A;  . ESOPHAGEAL DILATION N/A 06/06/2016   Procedure: ESOPHAGEAL DILATION;  Surgeon: Rogene Houston, MD;  [facility];  Service: Endoscopy;  Laterality: N/A;  . ESOPHAGOGASTRODUODENOSCOPY N/A 02/12/2014   Procedure: ESOPHAGOGASTRODUODENOSCOPY (EGD);  Surgeon: Rogene Houston, MD;  [facility];  Service: Endoscopy;  Laterality: N/A;  150  . ESOPHAGOGASTRODUODENOSCOPY N/A 07/06/2015   Procedure: ESOPHAGOGASTRODUODENOSCOPY (EGD);  Surgeon: Rogene Houston, MD;  [facility];  Service: Endoscopy;  Laterality: N/A;  1:25 -  moved to 1/18 @ 10:30 - Ann to notify pt  . ESOPHAGOGASTRODUODENOSCOPY (EGD) WITH ESOPHAGEAL DILATION N/A 07/25/2012   Procedure: ESOPHAGOGASTRODUODENOSCOPY (EGD) WITH ESOPHAGEAL DILATION;  Surgeon: Rogene Houston, MD;  [facility];  Service: Endoscopy;  Laterality: N/A;  325-rescheduled to [place] notified pt  . ESOPHAGOGASTRODUODENOSCOPY (EGD) WITH PROPOFOL N/A 06/06/2016   Procedure: ESOPHAGOGASTRODUODENOSCOPY (EGD) WITH PROPOFOL;  Surgeon: Rogene Houston, MD;  [facility];  Service: Endoscopy;  Laterality: N/A;  . EYE SURGERY     cataracts removed  . Foot surgeries Bilateral    hammer toes  . MALONEY DILATION N/A 02/12/2014   Procedure: Venia Minks DILATION;  Surgeon: Rogene Houston, MD;  [facility];  Service: Endoscopy;  Laterality: N/A;  . POLYPECTOMY  05/01/2019   Procedure: POLYPECTOMY;  Surgeon: Rogene Houston, MD;  [facility];  Service: Endoscopy;;  colon   . Right knee arthroscopy     x2  . TONSILLECTOMY    . TOTAL ABDOMINAL HYSTERECTOMY    . TOTAL KNEE ARTHROPLASTY Right 04/03/2017   Procedure: RIGHT TOTAL KNEE ARTHROPLASTY;  Surgeon: Latanya Maudlin, MD;  Location: [facility];  Service: Orthopedics;  Laterality: Right;    Social History   Tobacco Use  Smoking Status Never Smoker  Smokeless Tobacco Never Used    Social History   Substance and Sexual Activity  Alcohol Use No  . Alcohol/week: 0.0 standard drinks   Comment: socially   Patient has been married two times. Her second husband has been deceased about 7 or 8 years. She has a daughter who was recently married and who lives in [place].  Allergies: Allergies  Allergen Reactions  . Levofloxacin Anaphylaxis and Rash  . Cardizem [Diltiazem] Other (See Comments)    Patient states that she could not think, felt that she was in the twilight zone. Arm discomfort.  . Lipitor [Atorvastatin] Other (See Comments)    Caused her body to be "out of whack"  - elevated sugar  also.  . Sulfa Antibiotics Itching  . Diazepam Nausea Only    Current Facility-Administered Medications  Medication Dose Route Frequency Provider Last Rate Last Admin  . 0.9% sodium chloride infusion  1 mL/kg/hr Intravenous Continuous Martinique, Peter M, MD 67.2 mL/hr at 04/26/20 1022 1 mL/kg/hr at 04/26/20 1022  . [MAR Hold] acetaminophen (TYLENOL) tablet 650 mg  650 mg Oral Q4H PRN Nipp, Carriel T, MD      . Doug Sou Hold] acyclovir (ZOVIRAX) tablet 400 mg  400 mg Oral Daily Nipp, Carriel T, MD      . Doug Sou Hold] ALPRAZolam (XANAX) tablet 0.25 mg  0.25  mg Oral QHS PRN Nipp, Carriel T, MD   0.25 mg at 04/26/20 0130  . aspirin chewable tablet 81 mg  81 mg Oral Pre-Cath Martinique, Peter M, MD      . Doug Sou Hold] aspirin EC tablet 81 mg  81 mg Oral Daily Nipp, Carriel T, MD      . heparin ADULT infusion 100 units/mL (25000 units/267mL sodium chloride 0.45%)  850 Units/hr Intravenous Continuous Fay Records, MD      . Doug Sou Hold] insulin aspart (novoLOG) injection 0-5 Units  0-5 Units Subcutaneous QHS Nipp, Carriel T, MD      . Doug Sou Hold] insulin aspart (novoLOG) injection 0-9 Units  0-9 Units Subcutaneous TID WC Nipp, Carriel T, MD      . Doug Sou Hold] isosorbide dinitrate (ISORDIL) tablet 10 mg  10 mg Oral BID Nipp, Carriel T, MD   10 mg at 04/26/20 0129  . [MAR Hold] loratadine (CLARITIN) tablet 10 mg  10 mg Oral Daily Nipp, Carriel T, MD      . LORazepam (ATIVAN) tablet 0.25 mg  0.25 mg Oral Q8H PRN Martinique, Peter M, MD   0.25 mg at 04/26/20 1105  . [MAR Hold] metoprolol tartrate (LOPRESSOR) tablet 50 mg  50 mg Oral BID Nipp, Carriel T, MD   50 mg at 04/26/20 0129  . morphine 4 MG/ML injection 2 mg  2 mg Intravenous Q2H PRN Martinique, Peter M, MD   2 mg at 04/26/20 1137  . [MAR Hold] nitroGLYCERIN (NITROSTAT) SL tablet 0.4 mg  0.4 mg Sublingual Q5 Min x 3 PRN Nipp, Carriel T, MD      . nitroGLYCERIN 50 mg in dextrose 5 % 250 mL (0.2 mg/mL) infusion  5-50 mcg/min Intravenous Titrated Martinique, Peter M, MD 3 mL/hr at  04/26/20 1140 10 mcg/min at 04/26/20 1140  . [MAR Hold] ondansetron (ZOFRAN) injection 4 mg  4 mg Intravenous Q6H PRN Nipp, Carriel T, MD      . Doug Sou Hold] oxybutynin (DITROPAN-XL) 24 hr tablet 10 mg  10 mg Oral Daily Nipp, Carriel T, MD      . Doug Sou Hold] pantoprazole (PROTONIX) EC tablet 40 mg  40 mg Oral Daily Nipp, Carriel T, MD      . Doug Sou Hold] rosuvastatin (CRESTOR) tablet 40 mg  40 mg Oral Daily Reino Bellis B, NP        Medications Prior to Admission  Medication Sig Dispense Refill Last Dose  . acyclovir (ZOVIRAX) 400 MG tablet Take 400 mg by mouth daily.   04/25/2020 at Unknown time  . ALPRAZolam (XANAX) 0.5 MG tablet Take 0.25 mg by mouth at bedtime as needed for anxiety.    04/24/2020 at Unknown time  . cetirizine (ZYRTEC) 10 MG tablet Take 10 mg by mouth daily.   04/25/2020 at Unknown time  . glimepiride (AMARYL) 2 MG tablet Take 2 mg by mouth daily with breakfast.    04/25/2020 at Unknown time  . isosorbide dinitrate (ISORDIL) 10 MG tablet Take 1 tablet (10 mg total) by mouth 2 (two) times daily. 60 tablet 6 04/25/2020 at Unknown time  . levocetirizine (XYZAL) 5 MG tablet Take 5 mg by mouth daily.    04/25/2020 at Unknown time  . metFORMIN (GLUCOPHAGE) 500 MG tablet Take 500 mg by mouth 2 (two) times daily with a meal.    04/25/2020 at Unknown time  . metoprolol (LOPRESSOR) 50 MG tablet Take 50 mg by mouth 2 (two) times daily.    04/25/2020 at 0800  .  oxybutynin (DITROPAN-XL) 10 MG 24 hr tablet Take 10 mg by mouth daily.   04/25/2020 at Unknown time  . pantoprazole (PROTONIX) 40 MG tablet Take 40 mg by mouth daily.   04/25/2020 at Unknown time  . rivaroxaban (XARELTO) 20 MG TABS tablet Take 20 mg by mouth every other day.   04/23/2020  . rosuvastatin (CRESTOR) 10 MG tablet Take 1 tablet (10 mg total) by mouth daily. 90 tablet 3 04/25/2020 at Unknown time  . amoxicillin (AMOXIL) 500 MG capsule Take 1,000 mg by mouth 2 (two) times daily. Before dental visits   Unknown at Unknown time  .  docusate sodium (COLACE) 100 MG capsule Take 2 capsules (200 mg total) by mouth at bedtime. (Patient not taking: Reported on 04/25/2020)   Not Taking at Unknown time    Family History  Problem Relation Age of Onset  . Stroke Father   . Diabetes Mother   . Diabetes Sister   . Colon cancer Neg Hx   Patient was abandoned by her mother at around 42 weeks and was raised by her grandfather. Her father raise her sister.  Review of Systems:      Cardiac Review of Systems: Y or  [  N  ]= no  Chest Pain [ Y   ]  Resting SOB [ N  ] Exertional SOB  [ Y ]  Orthopnea [ N ] Syncope  [place]qua.Slicker  ]   Presyncope [ N  ]  General Review of Systems: [Y] = yes [ N ]=no Constitional:  nausea Aqua.Slicker  ]; night sweats Aqua.Slicker  ]; fever [ N ];                                                            Dental: Has top dentures  Eye :  Amaurosis fugax[ N ]; Resp: cough [ N ];  wheezing[N  ];  hemoptysis[N  ];  GI:   vomiting[ N ];  melena[ N ];  hematochezia [ N ];  GU:  hematuria[ N ];                Musculosketetal:  joint pain[ Y-right knee pain.S/p total right knee 2018 ];    Heme/Lymph: bruising[ N ];  bleeding[ N ];  anemia[ N ];  Neuro: TIA[ N ];  stroke[N  ];  vertigo[ N ];  seizures[ N ];    Psych:depression[ Y ]; anxiety[Y  ];  Endocrine: diabetes[Y  ];  thyroid dysfunction[Y-TSH  Chrysa.Hals ];                Physical Exam: BP (P) 110/69   Pulse 79   Temp 97.9 F (36.6 C) (Oral)   Resp 16   Ht 5\' 3"  (1.6 m)   Wt 67.2 kg   SpO2 95%   BMI 26.24 kg/m    General appearance: alert, cooperative and no distress Head: Normocephalic, without obvious abnormality, atraumatic Neck: no carotid bruit, no JVD and supple, symmetrical, trachea midline Resp: clear to auscultation bilaterally Cardio: RRR, no murmur GI: Soft, non tender, bowel sounds present Extremities: No LE edema. Palpable DP/PT bilaterally Neurologic: Grossly normal  Diagnostic Studies & Laboratory data:     Recent Radiology Findings:   DG Chest 2  View  Result Date: 04/25/2020 CLINICAL DATA:  Chest pain for 1 day, reflux, hypertension EXAM: CHEST - 2 VIEW COMPARISON:  03/24/2020 FINDINGS: Frontal and lateral views of the chest demonstrate an unremarkable cardiac silhouette. Small hiatal hernia. No airspace disease, effusion, or pneumothorax. Mild bibasilar scarring unchanged. No acute bony abnormalities. IMPRESSION: 1. No acute intrathoracic process. 2. Hiatal hernia. Electronically Signed   By: Randa Ngo M.D.   On: 04/25/2020 21:12   CARDIAC CATHETERIZATION  Result Date: 04/26/2020  Ost LM lesion is 25% stenosed.  Prox LAD to Mid LAD lesion is 75% stenosed.  1st Diag lesion is 90% stenosed.  Ost Cx to Prox Cx lesion is 80% stenosed.  1st Mrg lesion is 95% stenosed.  RPDA lesion is 90% stenosed.  1st RPL lesion is 90% stenosed.  The left ventricular systolic function is normal.  LV end diastolic pressure is normal.  The left ventricular ejection fraction is 55-65% by visual estimate.  1. Severe 3 vessel obstructive CAD 2. Normal LV function 3. Normal LVEDP Plan: would consider revascularization with CABG.     I have independently reviewed the above radiologic studies and discussed with the patient   Recent Lab Findings: Lab Results  Component Value Date   WBC 9.7 04/26/2020   HGB 12.0 04/26/2020   HCT 39.7 04/26/2020   PLT 199 04/26/2020   GLUCOSE 134 (H) 04/26/2020   CHOL 138 04/26/2020   TRIG 93 04/26/2020   HDL 37 (L) 04/26/2020   LDLCALC 82 04/26/2020   ALT 22 03/25/2017   AST 21 03/25/2017   NA 141 04/26/2020   K 3.6 04/26/2020   CL 108 04/26/2020   CREATININE 0.74 04/26/2020   BUN 11 04/26/2020   CO2 23 04/26/2020   TSH 5.542 (H) 04/25/2020   INR 1.2 04/26/2020   HGBA1C 7.5 (H) 04/25/2020   Assessment / Plan:   1. Coronary artery disease-Dr. Orvan Seen to evaluate but appears she will benefit from coronary artery bypass grafting surgery. Of note, she is right hand dominant and underwent cardiac  catheterization via right radial artery.  2. History of PAF-on Rivaroxaban prior to admission. Her last dose of Rivaroxaban was either Saturday or Sunday 3. History of diabetes mellitus-HGA1C 7.5. She was on Glimepiride 2 mg every morning and  Metformin 500 mg bid prior to admission. 4. History of hypertension-on Lopressor 50 mg bid and Isordil 10 mg bid 5. Hyperlipidemia-on Crestor as of October 6. Elevated TSH- on this admission 5.542;previously in October was 1.850  I  spent 20 minutes counseling the patient face to face.   Lars Pinks PA-C 04/26/2020 11:57 AM

## 2020-04-26 NOTE — Psychosocial Assessment (Signed)
CVTS at bedside

## 2020-04-26 NOTE — Interval H&P Note (Signed)
History and Physical Interval Note:  04/26/2020 9:07 AM  Katrina Castro  has presented today for surgery, with the diagnosis of unstable angina.  The various methods of treatment have been discussed with the patient and family. After consideration of risks, benefits and other options for treatment, the patient has consented to  Procedure(s): LEFT HEART CATH AND CORONARY ANGIOGRAPHY (N/A) as a surgical intervention.  The patient's history has been reviewed, patient examined, no change in status, stable for surgery.  I have reviewed the patient's chart and labs.  Questions were answered to the patient's satisfaction.   Cath Lab Visit (complete for each Cath Lab visit)  Clinical Evaluation Leading to the Procedure:   ACS: Yes.    Non-ACS:    Anginal Classification: CCS III  Anti-ischemic medical therapy: Maximal Therapy (2 or more classes of medications)  Non-Invasive Test Results: High-risk stress test findings: cardiac mortality >3%/year  Prior CABG: No previous CABG        Katrina Castro [facility] 04/26/2020 9:07 AM

## 2020-04-26 NOTE — Progress Notes (Signed)
[place] for Heparin Indication: chest pain/ACS  Allergies  Allergen Reactions  . Levofloxacin Anaphylaxis and Rash  . Cardizem [Diltiazem] Other (See Comments)    Patient states that she could not think, felt that she was in the twilight zone. Arm discomfort.  . Lipitor [Atorvastatin] Other (See Comments)    Caused her body to be "out of whack"  - elevated sugar also.  . Sulfa Antibiotics Itching  . Diazepam Nausea Only    Patient Measurements: Height: 5\' 3"  (160 cm) Weight: 67.2 kg (148 lb 2.4 oz) IBW/kg (Calculated) : 52.4  Vital Signs: Temp: 97.9 F (36.6 C) (11/09 0736) Temp Source: Oral (11/09 0736) BP: 99/42 (11/09 1035) Pulse Rate: 85 (11/09 1045)  Labs: Recent Labs    04/25/20 2058 04/25/20 2341 04/26/20 0145 04/26/20 0250 04/26/20 0825  HGB 12.5  --   --  12.0  --   HCT 41.4  --   --  39.7  --   PLT 317  --   --  199  --   APTT  --  33  --   --   --   LABPROT  --  13.6  --  14.6  --   INR  --  1.1  --  1.2  --   HEPARINUNFRC  --  <0.10*  --   --  0.33  CREATININE 0.82  --  0.74  --   --   TROPONINIHS 4 4 5   --   --     Estimated Creatinine Clearance: 57.6 mL/min (by C-G formula based on SCr of 0.74 mg/dL).   Medical History: Past Medical History:  Diagnosis Date  . Arthritis   . Concussion    2015  . Depression   . Diabetes ([place])    type 2  . Dysphagia   . Dysrhythmia    afib  . GERD (gastroesophageal reflux disease)   . Headache   . History of bronchitis   . Hypertension   . Seasonal allergies   . Toenail fungus     Medications:  No current facility-administered medications on file prior to encounter.   Current Outpatient Medications on File Prior to Encounter  Medication Sig Dispense Refill  . acyclovir (ZOVIRAX) 400 MG tablet Take 400 mg by mouth daily.    Marland Kitchen ALPRAZolam (XANAX) 0.5 MG tablet Take 0.25 mg by mouth at bedtime as needed for anxiety.     . cetirizine (ZYRTEC) 10 MG tablet Take  10 mg by mouth daily.    Marland Kitchen glimepiride (AMARYL) 2 MG tablet Take 2 mg by mouth daily with breakfast.     . isosorbide dinitrate (ISORDIL) 10 MG tablet Take 1 tablet (10 mg total) by mouth 2 (two) times daily. 60 tablet 6  . levocetirizine (XYZAL) 5 MG tablet Take 5 mg by mouth daily.     . metFORMIN (GLUCOPHAGE) 500 MG tablet Take 500 mg by mouth 2 (two) times daily with a meal.     . metoprolol (LOPRESSOR) 50 MG tablet Take 50 mg by mouth 2 (two) times daily.     Marland Kitchen oxybutynin (DITROPAN-XL) 10 MG 24 hr tablet Take 10 mg by mouth daily.    . pantoprazole (PROTONIX) 40 MG tablet Take 40 mg by mouth daily.    . rivaroxaban (XARELTO) 20 MG TABS tablet Take 20 mg by mouth every other day.    . rosuvastatin (CRESTOR) 10 MG tablet Take 1 tablet (10 mg total) by mouth daily.  90 tablet 3  . amoxicillin (AMOXIL) 500 MG capsule Take 1,000 mg by mouth 2 (two) times daily. Before dental visits    . docusate sodium (COLACE) 100 MG capsule Take 2 capsules (200 mg total) by mouth at bedtime. (Patient not taking: Reported on 04/25/2020)       Assessment: 74 y.o. female with chest pain, h/o Afib and Xarelto on hold, for heparin. Last dose of Xarelto ~ 36 hours per MD< as pt has been taking every other day.    Heparin level came back prior to cath therapeutic at 0.33, on 850 units/hr. Hgb 12, plt 199. Cath 11/9 showing severe 3v CAD - plan for CTVS evaluation. Restart heparin 8 hours after sheath removal (documented on 11/9@0948 ).    Goal of Therapy:  Heparin level 0.3-0.7 units/ml Monitor platelets by anticoagulation protocol: Yes   Plan:  Restart heparin infusion at 850 units/hr on 11/9@1800  Order heparin level 8 hr after restart Monitor HL, CBC, and for s/sx of bleeding   Antonietta Jewel, PharmD, [place] Pharmacist  Phone: [phone] 04/26/2020 10:53 AM  Please check AMION for all [place] phone numbers After 10:00 PM, call [place] [phone]

## 2020-04-26 NOTE — Progress Notes (Signed)
TCTS consulted for CABG evaluation. °

## 2020-04-26 NOTE — Progress Notes (Signed)
Patient c/o increased chest pain 8/10, patient having anxiety and is tearful. Calm deep breaths instructed, patient placed on [place] 2L, Dr Martinique made aware. VSS. Orders received, see MAR. Patient reports feeling more calm and relaxed, chest pain 5/10. Will continue to monitor.

## 2020-04-27 ENCOUNTER — Inpatient Hospital Stay (HOSPITAL_COMMUNITY)
Admission: EM | Disposition: A | Discharge status: Home / Self Care | Payer: Self-pay | Source: Home / Self Care | Attending: Cardiothoracic Surgery

## 2020-04-27 ENCOUNTER — Inpatient Hospital Stay (HOSPITAL_COMMUNITY): Payer: Medicare Other

## 2020-04-27 ENCOUNTER — Inpatient Hospital Stay (HOSPITAL_COMMUNITY): Payer: Medicare Other | Admitting: Certified Registered Nurse Anesthetist

## 2020-04-27 DIAGNOSIS — Z951 Presence of aortocoronary bypass graft: Secondary | ICD-10-CM

## 2020-04-27 DIAGNOSIS — I25110 Atherosclerotic heart disease of native coronary artery with unstable angina pectoris: Secondary | ICD-10-CM | POA: Diagnosis not present

## 2020-04-27 HISTORY — PX: TEE WITHOUT CARDIOVERSION: SHX5443

## 2020-04-27 HISTORY — PX: CORONARY ARTERY BYPASS GRAFT: SHX141

## 2020-04-27 HISTORY — PX: RADIAL ARTERY HARVEST: SHX5067

## 2020-04-27 LAB — POCT I-STAT 7, (LYTES, BLD GAS, ICA,H+H)
Acid-Base Excess: 1 mmol/L (ref 0.0–2.0)
Acid-Base Excess: 3 mmol/L — ABNORMAL HIGH (ref 0.0–2.0)
Acid-Base Excess: 1 mmol/L (ref 0.0–2.0)
Acid-base deficit: 2 mmol/L (ref 0.0–2.0)
Acid-base deficit: 5 mmol/L — ABNORMAL HIGH (ref 0.0–2.0)
Acid-base deficit: 3 mmol/L — ABNORMAL HIGH (ref 0.0–2.0)
Acid-base deficit: 9 mmol/L — ABNORMAL HIGH (ref 0.0–2.0)
Acid-base deficit: 5 mmol/L — ABNORMAL HIGH (ref 0.0–2.0)
Bicarbonate: 25.1 mmol/L (ref 20.0–28.0)
Bicarbonate: 26.8 mmol/L (ref 20.0–28.0)
Bicarbonate: 26.1 mmol/L (ref 20.0–28.0)
Bicarbonate: 22.6 mmol/L (ref 20.0–28.0)
Bicarbonate: 22.4 mmol/L (ref 20.0–28.0)
Bicarbonate: 24.3 mmol/L (ref 20.0–28.0)
Bicarbonate: 18.6 mmol/L — ABNORMAL LOW (ref 20.0–28.0)
Bicarbonate: 21.7 mmol/L (ref 20.0–28.0)
Calcium, Ion: 1.26 mmol/L (ref 1.15–1.40)
Calcium, Ion: 0.96 mmol/L — ABNORMAL LOW (ref 1.15–1.40)
Calcium, Ion: 1.05 mmol/L — ABNORMAL LOW (ref 1.15–1.40)
Calcium, Ion: 0.99 mmol/L — ABNORMAL LOW (ref 1.15–1.40)
Calcium, Ion: 1.1 mmol/L — ABNORMAL LOW (ref 1.15–1.40)
Calcium, Ion: 1.1 mmol/L — ABNORMAL LOW (ref 1.15–1.40)
Calcium, Ion: 1.08 mmol/L — ABNORMAL LOW (ref 1.15–1.40)
Calcium, Ion: 1.12 mmol/L — ABNORMAL LOW (ref 1.15–1.40)
HCT: 31 % — ABNORMAL LOW (ref 36.0–46.0)
HCT: 20 % — ABNORMAL LOW (ref 36.0–46.0)
HCT: 26 % — ABNORMAL LOW (ref 36.0–46.0)
HCT: 26 % — ABNORMAL LOW (ref 36.0–46.0)
HCT: 34 % — ABNORMAL LOW (ref 36.0–46.0)
HCT: 32 % — ABNORMAL LOW (ref 36.0–46.0)
HCT: 32 % — ABNORMAL LOW (ref 36.0–46.0)
HCT: 33 % — ABNORMAL LOW (ref 36.0–46.0)
Hemoglobin: 10.5 g/dL — ABNORMAL LOW (ref 12.0–15.0)
Hemoglobin: 6.8 g/dL — CL (ref 12.0–15.0)
Hemoglobin: 8.8 g/dL — ABNORMAL LOW (ref 12.0–15.0)
Hemoglobin: 8.8 g/dL — ABNORMAL LOW (ref 12.0–15.0)
Hemoglobin: 11.6 g/dL — ABNORMAL LOW (ref 12.0–15.0)
Hemoglobin: 10.9 g/dL — ABNORMAL LOW (ref 12.0–15.0)
Hemoglobin: 10.9 g/dL — ABNORMAL LOW (ref 12.0–15.0)
Hemoglobin: 11.2 g/dL — ABNORMAL LOW (ref 12.0–15.0)
O2 Saturation: 100 %
O2 Saturation: 100 %
O2 Saturation: 100 %
O2 Saturation: 100 %
O2 Saturation: 99 %
O2 Saturation: 99 %
O2 Saturation: 99 %
O2 Saturation: 98 %
Patient temperature: 35.8
Patient temperature: 35.5
Potassium: 3.9 mmol/L (ref 3.5–5.1)
Potassium: 3.5 mmol/L (ref 3.5–5.1)
Potassium: 4.5 mmol/L (ref 3.5–5.1)
Potassium: 4.4 mmol/L (ref 3.5–5.1)
Potassium: 3.7 mmol/L (ref 3.5–5.1)
Potassium: 4.5 mmol/L (ref 3.5–5.1)
Potassium: 3.9 mmol/L (ref 3.5–5.1)
Potassium: 3.9 mmol/L (ref 3.5–5.1)
Sodium: 142 mmol/L (ref 135–145)
Sodium: 143 mmol/L (ref 135–145)
Sodium: 140 mmol/L (ref 135–145)
Sodium: 140 mmol/L (ref 135–145)
Sodium: 143 mmol/L (ref 135–145)
Sodium: 143 mmol/L (ref 135–145)
Sodium: 142 mmol/L (ref 135–145)
Sodium: 142 mmol/L (ref 135–145)
TCO2: 26 mmol/L (ref 22–32)
TCO2: 28 mmol/L (ref 22–32)
TCO2: 27 mmol/L (ref 22–32)
TCO2: 24 mmol/L (ref 22–32)
TCO2: 24 mmol/L (ref 22–32)
TCO2: 26 mmol/L (ref 22–32)
TCO2: 20 mmol/L — ABNORMAL LOW (ref 22–32)
TCO2: 23 mmol/L (ref 22–32)
pCO2 arterial: 38.7 mmHg (ref 32.0–48.0)
pCO2 arterial: 39.1 mmHg (ref 32.0–48.0)
pCO2 arterial: 41.9 mmHg (ref 32.0–48.0)
pCO2 arterial: 34.6 mmHg (ref 32.0–48.0)
pCO2 arterial: 46.2 mmHg (ref 32.0–48.0)
pCO2 arterial: 48.1 mmHg — ABNORMAL HIGH (ref 32.0–48.0)
pCO2 arterial: 44.5 mmHg (ref 32.0–48.0)
pCO2 arterial: 44.3 mmHg (ref 32.0–48.0)
pH, Arterial: 7.42 (ref 7.350–7.450)
pH, Arterial: 7.444 (ref 7.350–7.450)
pH, Arterial: 7.402 (ref 7.350–7.450)
pH, Arterial: 7.423 (ref 7.350–7.450)
pH, Arterial: 7.287 — ABNORMAL LOW (ref 7.350–7.450)
pH, Arterial: 7.303 — ABNORMAL LOW (ref 7.350–7.450)
pH, Arterial: 7.23 — ABNORMAL LOW (ref 7.350–7.450)
pH, Arterial: 7.299 — ABNORMAL LOW (ref 7.350–7.450)
pO2, Arterial: 294 mmHg — ABNORMAL HIGH (ref 83.0–108.0)
pO2, Arterial: 424 mmHg — ABNORMAL HIGH (ref 83.0–108.0)
pO2, Arterial: 379 mmHg — ABNORMAL HIGH (ref 83.0–108.0)
pO2, Arterial: 230 mmHg — ABNORMAL HIGH (ref 83.0–108.0)
pO2, Arterial: 127 mmHg — ABNORMAL HIGH (ref 83.0–108.0)
pO2, Arterial: 140 mmHg — ABNORMAL HIGH (ref 83.0–108.0)
pO2, Arterial: 141 mmHg — ABNORMAL HIGH (ref 83.0–108.0)
pO2, Arterial: 119 mmHg — ABNORMAL HIGH (ref 83.0–108.0)

## 2020-04-27 LAB — ECHO INTRAOPERATIVE TEE
AV Peak grad: 8.4 mmHg
Ao pk vel: 1.45 m/s
Height: 63 in
S' Lateral: 2.6 cm
Weight: 2366.4 oz

## 2020-04-27 LAB — POCT I-STAT, CHEM 8
BUN: 14 mg/dL (ref 8–23)
BUN: 14 mg/dL (ref 8–23)
BUN: 12 mg/dL (ref 8–23)
BUN: 11 mg/dL (ref 8–23)
BUN: 12 mg/dL (ref 8–23)
BUN: 13 mg/dL (ref 8–23)
Calcium, Ion: 1.22 mmol/L (ref 1.15–1.40)
Calcium, Ion: 1.23 mmol/L (ref 1.15–1.40)
Calcium, Ion: 1.04 mmol/L — ABNORMAL LOW (ref 1.15–1.40)
Calcium, Ion: 1 mmol/L — ABNORMAL LOW (ref 1.15–1.40)
Calcium, Ion: 1.01 mmol/L — ABNORMAL LOW (ref 1.15–1.40)
Calcium, Ion: 1 mmol/L — ABNORMAL LOW (ref 1.15–1.40)
Chloride: 105 mmol/L (ref 98–111)
Chloride: 106 mmol/L (ref 98–111)
Chloride: 105 mmol/L (ref 98–111)
Chloride: 102 mmol/L (ref 98–111)
Chloride: 103 mmol/L (ref 98–111)
Chloride: 104 mmol/L (ref 98–111)
Creatinine, Ser: 0.5 mg/dL (ref 0.44–1.00)
Creatinine, Ser: 0.5 mg/dL (ref 0.44–1.00)
Creatinine, Ser: 0.4 mg/dL — ABNORMAL LOW (ref 0.44–1.00)
Creatinine, Ser: 0.3 mg/dL — ABNORMAL LOW (ref 0.44–1.00)
Creatinine, Ser: 0.4 mg/dL — ABNORMAL LOW (ref 0.44–1.00)
Creatinine, Ser: 0.4 mg/dL — ABNORMAL LOW (ref 0.44–1.00)
Glucose, Bld: 139 mg/dL — ABNORMAL HIGH (ref 70–99)
Glucose, Bld: 123 mg/dL — ABNORMAL HIGH (ref 70–99)
Glucose, Bld: 138 mg/dL — ABNORMAL HIGH (ref 70–99)
Glucose, Bld: 160 mg/dL — ABNORMAL HIGH (ref 70–99)
Glucose, Bld: 190 mg/dL — ABNORMAL HIGH (ref 70–99)
Glucose, Bld: 206 mg/dL — ABNORMAL HIGH (ref 70–99)
HCT: 31 % — ABNORMAL LOW (ref 36.0–46.0)
HCT: 30 % — ABNORMAL LOW (ref 36.0–46.0)
HCT: 26 % — ABNORMAL LOW (ref 36.0–46.0)
HCT: 22 % — ABNORMAL LOW (ref 36.0–46.0)
HCT: 23 % — ABNORMAL LOW (ref 36.0–46.0)
HCT: 26 % — ABNORMAL LOW (ref 36.0–46.0)
Hemoglobin: 10.5 g/dL — ABNORMAL LOW (ref 12.0–15.0)
Hemoglobin: 10.2 g/dL — ABNORMAL LOW (ref 12.0–15.0)
Hemoglobin: 8.8 g/dL — ABNORMAL LOW (ref 12.0–15.0)
Hemoglobin: 7.5 g/dL — ABNORMAL LOW (ref 12.0–15.0)
Hemoglobin: 7.8 g/dL — ABNORMAL LOW (ref 12.0–15.0)
Hemoglobin: 8.8 g/dL — ABNORMAL LOW (ref 12.0–15.0)
Potassium: 3.8 mmol/L (ref 3.5–5.1)
Potassium: 3.6 mmol/L (ref 3.5–5.1)
Potassium: 5 mmol/L (ref 3.5–5.1)
Potassium: 4.9 mmol/L (ref 3.5–5.1)
Potassium: 5.2 mmol/L — ABNORMAL HIGH (ref 3.5–5.1)
Potassium: 4.4 mmol/L (ref 3.5–5.1)
Sodium: 140 mmol/L (ref 135–145)
Sodium: 141 mmol/L (ref 135–145)
Sodium: 140 mmol/L (ref 135–145)
Sodium: 137 mmol/L (ref 135–145)
Sodium: 138 mmol/L (ref 135–145)
Sodium: 141 mmol/L (ref 135–145)
TCO2: 24 mmol/L (ref 22–32)
TCO2: 22 mmol/L (ref 22–32)
TCO2: 26 mmol/L (ref 22–32)
TCO2: 24 mmol/L (ref 22–32)
TCO2: 24 mmol/L (ref 22–32)
TCO2: 21 mmol/L — ABNORMAL LOW (ref 22–32)

## 2020-04-27 LAB — GLUCOSE, CAPILLARY
Glucose-Capillary: 151 mg/dL — ABNORMAL HIGH (ref 70–99)
Glucose-Capillary: 169 mg/dL — ABNORMAL HIGH (ref 70–99)
Glucose-Capillary: 175 mg/dL — ABNORMAL HIGH (ref 70–99)
Glucose-Capillary: 146 mg/dL — ABNORMAL HIGH (ref 70–99)
Glucose-Capillary: 122 mg/dL — ABNORMAL HIGH (ref 70–99)
Glucose-Capillary: 122 mg/dL — ABNORMAL HIGH (ref 70–99)
Glucose-Capillary: 98 mg/dL (ref 70–99)
Glucose-Capillary: 193 mg/dL — ABNORMAL HIGH (ref 70–99)
Glucose-Capillary: 201 mg/dL — ABNORMAL HIGH (ref 70–99)
Glucose-Capillary: 210 mg/dL — ABNORMAL HIGH (ref 70–99)

## 2020-04-27 LAB — HEMOGLOBIN AND HEMATOCRIT, BLOOD
HCT: 24.2 % — ABNORMAL LOW (ref 36.0–46.0)
Hemoglobin: 7.8 g/dL — ABNORMAL LOW (ref 12.0–15.0)

## 2020-04-27 LAB — CBC
HCT: 36.5 % (ref 36.0–46.0)
HCT: 35.9 % — ABNORMAL LOW (ref 36.0–46.0)
HCT: 34.9 % — ABNORMAL LOW (ref 36.0–46.0)
Hemoglobin: 11.3 g/dL — ABNORMAL LOW (ref 12.0–15.0)
Hemoglobin: 11.1 g/dL — ABNORMAL LOW (ref 12.0–15.0)
Hemoglobin: 11 g/dL — ABNORMAL LOW (ref 12.0–15.0)
MCH: 26 pg (ref 26.0–34.0)
MCH: 26.2 pg (ref 26.0–34.0)
MCH: 27 pg (ref 26.0–34.0)
MCHC: 31 g/dL (ref 30.0–36.0)
MCHC: 30.9 g/dL (ref 30.0–36.0)
MCHC: 31.5 g/dL (ref 30.0–36.0)
MCV: 84.1 fL (ref 80.0–100.0)
MCV: 84.9 fL (ref 80.0–100.0)
MCV: 85.5 fL (ref 80.0–100.0)
Platelets: 281 10*3/uL (ref 150–400)
Platelets: 191 10*3/uL (ref 150–400)
Platelets: 217 10*3/uL (ref 150–400)
RBC: 4.34 MIL/uL (ref 3.87–5.11)
RBC: 4.23 MIL/uL (ref 3.87–5.11)
RBC: 4.08 MIL/uL (ref 3.87–5.11)
RDW: 14.1 % (ref 11.5–15.5)
RDW: 14.1 % (ref 11.5–15.5)
RDW: 14.4 % (ref 11.5–15.5)
WBC: 6.7 10*3/uL (ref 4.0–10.5)
WBC: 14.8 10*3/uL — ABNORMAL HIGH (ref 4.0–10.5)
WBC: 16.6 10*3/uL — ABNORMAL HIGH (ref 4.0–10.5)
nRBC: 0 % (ref 0.0–0.2)
nRBC: 0 % (ref 0.0–0.2)
nRBC: 0 % (ref 0.0–0.2)

## 2020-04-27 LAB — BASIC METABOLIC PANEL
Anion gap: 9 (ref 5–15)
Anion gap: 6 (ref 5–15)
BUN: 15 mg/dL (ref 8–23)
BUN: 10 mg/dL (ref 8–23)
CO2: 23 mmol/L (ref 22–32)
CO2: 21 mmol/L — ABNORMAL LOW (ref 22–32)
Calcium: 9.5 mg/dL (ref 8.9–10.3)
Calcium: 7.5 mg/dL — ABNORMAL LOW (ref 8.9–10.3)
Chloride: 108 mmol/L (ref 98–111)
Chloride: 112 mmol/L — ABNORMAL HIGH (ref 98–111)
Creatinine, Ser: 0.69 mg/dL (ref 0.44–1.00)
Creatinine, Ser: 0.64 mg/dL (ref 0.44–1.00)
GFR, Estimated: >60 mL/min (ref >60)
GFR, Estimated: >60 mL/min (ref >60)
Glucose, Bld: 156 mg/dL — ABNORMAL HIGH (ref 70–99)
Glucose, Bld: 205 mg/dL — ABNORMAL HIGH (ref 70–99)
Potassium: 3.8 mmol/L (ref 3.5–5.1)
Potassium: 4.6 mmol/L (ref 3.5–5.1)
Sodium: 140 mmol/L (ref 135–145)
Sodium: 139 mmol/L (ref 135–145)

## 2020-04-27 LAB — PREPARE RBC (CROSSMATCH)

## 2020-04-27 LAB — PLATELET COUNT: Platelets: 210 10*3/uL (ref 150–400)

## 2020-04-27 LAB — PROTIME-INR
INR: 1.4 — ABNORMAL HIGH (ref 0.8–1.2)
Prothrombin Time: 17 seconds — ABNORMAL HIGH (ref 11.4–15.2)

## 2020-04-27 LAB — POCT I-STAT EG7
Acid-base deficit: 1 mmol/L (ref 0.0–2.0)
Bicarbonate: 23.9 mmol/L (ref 20.0–28.0)
Calcium, Ion: 0.95 mmol/L — ABNORMAL LOW (ref 1.15–1.40)
HCT: 23 % — ABNORMAL LOW (ref 36.0–46.0)
Hemoglobin: 7.8 g/dL — ABNORMAL LOW (ref 12.0–15.0)
O2 Saturation: 82 %
Potassium: 6.1 mmol/L — ABNORMAL HIGH (ref 3.5–5.1)
Sodium: 141 mmol/L (ref 135–145)
TCO2: 25 mmol/L (ref 22–32)
pCO2, Ven: 39.1 mmHg — ABNORMAL LOW (ref 44.0–60.0)
pH, Ven: 7.393 (ref 7.250–7.430)
pO2, Ven: 47 mmHg — ABNORMAL HIGH (ref 32.0–45.0)

## 2020-04-27 LAB — MAGNESIUM: Magnesium: 2.9 mg/dL — ABNORMAL HIGH (ref 1.7–2.4)

## 2020-04-27 LAB — APTT: aPTT: 29 seconds (ref 24–36)

## 2020-04-27 SURGERY — CORONARY ARTERY BYPASS GRAFTING (CABG)
Anesthesia: General | Site: Chest

## 2020-04-27 MED ORDER — FAMOTIDINE IN NACL 20-0.9 MG/50ML-% IV SOLN
20.0000 mg | Freq: Two times a day (BID) | INTRAVENOUS | Status: AC
  Administered 2020-04-27 (×2): 20 mg via INTRAVENOUS
  Filled 2020-04-27: qty 50

## 2020-04-27 MED ORDER — SODIUM CHLORIDE (PF) 0.9 % IJ SOLN
OROMUCOSAL | Status: DC | PRN
  Administered 2020-04-27 (×2): 4 mL via TOPICAL

## 2020-04-27 MED ORDER — SODIUM BICARBONATE 8.4 % IV SOLN
50.0000 meq | Freq: Once | INTRAVENOUS | Status: AC
  Administered 2020-04-27: 50 meq via INTRAVENOUS

## 2020-04-27 MED ORDER — MORPHINE SULFATE (PF) 2 MG/ML IV SOLN
1.0000 mg | INTRAVENOUS | Status: DC | PRN
  Administered 2020-04-27: 2 mg via INTRAVENOUS
  Administered 2020-04-29: 1 mg via INTRAVENOUS
  Filled 2020-04-27 (×2): qty 1

## 2020-04-27 MED ORDER — ARTIFICIAL TEARS OPHTHALMIC OINT
TOPICAL_OINTMENT | OPHTHALMIC | Status: DC | PRN
  Administered 2020-04-27: 1 via OPHTHALMIC

## 2020-04-27 MED ORDER — LACTATED RINGERS IV SOLN: INTRAVENOUS | Status: DC | PRN

## 2020-04-27 MED ORDER — ACETAMINOPHEN 160 MG/5ML PO SOLN
1000.0000 mg | Freq: Four times a day (QID) | ORAL | Status: AC
  Administered 2020-04-27 – 2020-04-28 (×2): 1000 mg
  Filled 2020-04-27 (×2): qty 40.6

## 2020-04-27 MED ORDER — ONDANSETRON HCL 4 MG/2ML IJ SOLN
4.0000 mg | Freq: Four times a day (QID) | INTRAMUSCULAR | Status: DC | PRN
  Administered 2020-04-27 – 2020-05-08 (×7): 4 mg via INTRAVENOUS
  Filled 2020-04-27 (×8): qty 2

## 2020-04-27 MED ORDER — 0.9 % SODIUM CHLORIDE (POUR BTL) OPTIME
TOPICAL | Status: DC | PRN
  Administered 2020-04-27: 5000 mL

## 2020-04-27 MED ORDER — BISACODYL 10 MG RE SUPP: 10.0000 mg | Freq: Every day | RECTAL | Status: DC

## 2020-04-27 MED ORDER — SODIUM CHLORIDE 0.9 % IV SOLN: INTRAVENOUS | Status: DC | PRN

## 2020-04-27 MED ORDER — SODIUM BICARBONATE 8.4 % IV SOLN: 50.0000 meq | Freq: Once | INTRAVENOUS | Status: AC

## 2020-04-27 MED ORDER — STERILE WATER FOR INJECTION IV SOLN
INTRAVENOUS | Status: DC | PRN
  Administered 2020-04-27: 30 mL

## 2020-04-27 MED ORDER — MIDAZOLAM HCL 2 MG/2ML IJ SOLN
2.0000 mg | INTRAMUSCULAR | Status: DC | PRN
  Administered 2020-04-28: 2 mg via INTRAVENOUS
  Filled 2020-04-27: qty 2

## 2020-04-27 MED ORDER — ASPIRIN 81 MG PO CHEW
324.0000 mg | CHEWABLE_TABLET | Freq: Every day | ORAL | Status: DC
  Administered 2020-04-28: 324 mg
  Filled 2020-04-27: qty 4

## 2020-04-27 MED ORDER — ACETAMINOPHEN 650 MG RE SUPP
650.0000 mg | Freq: Once | RECTAL | Status: AC
  Administered 2020-04-27: 650 mg via RECTAL

## 2020-04-27 MED ORDER — DOCUSATE SODIUM 100 MG PO CAPS
200.0000 mg | ORAL_CAPSULE | Freq: Every day | ORAL | Status: DC
  Administered 2020-04-29: 200 mg via ORAL
  Filled 2020-04-27 (×3): qty 2

## 2020-04-27 MED ORDER — CHLORHEXIDINE GLUCONATE 0.12% ORAL RINSE (MEDLINE KIT)
15.0000 mL | Freq: Two times a day (BID) | OROMUCOSAL | Status: DC
  Administered 2020-04-27 – 2020-04-28 (×2): 15 mL via OROMUCOSAL

## 2020-04-27 MED ORDER — FENTANYL CITRATE (PF) 250 MCG/5ML IJ SOLN
INTRAMUSCULAR | Status: AC
  Filled 2020-04-27: qty 25

## 2020-04-27 MED ORDER — INSULIN REGULAR(HUMAN) IN NACL 100-0.9 UT/100ML-% IV SOLN
INTRAVENOUS | Status: DC
  Administered 2020-04-27: 9 [IU]/h via INTRAVENOUS
  Filled 2020-04-27: qty 100

## 2020-04-27 MED ORDER — EPINEPHRINE HCL 5 MG/250ML IV SOLN IN NS
0.0000 ug/min | INTRAVENOUS | Status: AC
  Administered 2020-04-27: 16:00:00 0.507 ug/min via INTRAVENOUS

## 2020-04-27 MED ORDER — STERILE WATER FOR INJECTION IJ SOLN
INTRAMUSCULAR | Status: DC | PRN
  Administered 2020-04-27: 10 mL via INTRAVENOUS

## 2020-04-27 MED ORDER — SODIUM CHLORIDE 0.9 % IV SOLN
1.5000 g | Freq: Two times a day (BID) | INTRAVENOUS | Status: AC
  Administered 2020-04-27 – 2020-04-29 (×4): 1.5 g via INTRAVENOUS
  Filled 2020-04-27 (×4): qty 1.5

## 2020-04-27 MED ORDER — ACETAMINOPHEN 500 MG PO TABS
1000.0000 mg | ORAL_TABLET | Freq: Four times a day (QID) | ORAL | Status: AC
  Administered 2020-04-28 – 2020-05-02 (×12): 1000 mg via ORAL
  Filled 2020-04-27 (×15): qty 2

## 2020-04-27 MED ORDER — PANTOPRAZOLE SODIUM 40 MG PO TBEC
40.0000 mg | DELAYED_RELEASE_TABLET | Freq: Every day | ORAL | Status: DC
  Administered 2020-04-29 – 2020-05-10 (×12): 40 mg via ORAL
  Filled 2020-04-27 (×12): qty 1

## 2020-04-27 MED ORDER — BISACODYL 5 MG PO TBEC
10.0000 mg | DELAYED_RELEASE_TABLET | Freq: Every day | ORAL | Status: DC
  Administered 2020-04-28 – 2020-04-29 (×2): 10 mg via ORAL
  Filled 2020-04-27 (×3): qty 2

## 2020-04-27 MED ORDER — FENTANYL CITRATE (PF) 250 MCG/5ML IJ SOLN
INTRAMUSCULAR | Status: DC | PRN
  Administered 2020-04-27: 50 ug via INTRAVENOUS
  Administered 2020-04-27: 450 ug via INTRAVENOUS
  Administered 2020-04-27 (×2): 50 ug via INTRAVENOUS
  Administered 2020-04-27: 150 ug via INTRAVENOUS
  Administered 2020-04-27: 50 ug via INTRAVENOUS
  Administered 2020-04-27: 100 ug via INTRAVENOUS
  Administered 2020-04-27: 50 ug via INTRAVENOUS
  Administered 2020-04-27 (×3): 100 ug via INTRAVENOUS

## 2020-04-27 MED ORDER — BUPIVACAINE LIPOSOME 1.3 % IJ SUSP
20.0000 mL | INTRAMUSCULAR | Status: DC
  Filled 2020-04-27: qty 20

## 2020-04-27 MED ORDER — METOPROLOL TARTRATE 5 MG/5ML IV SOLN: 2.5000 mg | INTRAVENOUS | Status: DC | PRN

## 2020-04-27 MED ORDER — HEMOSTATIC AGENTS (NO CHARGE) OPTIME
TOPICAL | Status: DC | PRN
  Administered 2020-04-27 (×2): 1 via TOPICAL

## 2020-04-27 MED ORDER — PLASMA-LYTE 148 IV SOLN
INTRAVENOUS | Status: DC | PRN
  Administered 2020-04-27: 500 mL via INTRAVASCULAR

## 2020-04-27 MED ORDER — ROCURONIUM BROMIDE 10 MG/ML (PF) SYRINGE
PREFILLED_SYRINGE | INTRAVENOUS | Status: DC | PRN
  Administered 2020-04-27: 40 mg via INTRAVENOUS
  Administered 2020-04-27: 50 mg via INTRAVENOUS
  Administered 2020-04-27: 60 mg via INTRAVENOUS

## 2020-04-27 MED ORDER — POTASSIUM CHLORIDE 10 MEQ/50ML IV SOLN
10.0000 meq | INTRAVENOUS | Status: AC
  Administered 2020-04-27 (×3): 10 meq via INTRAVENOUS

## 2020-04-27 MED ORDER — VANCOMYCIN HCL 1000 MG IV SOLR
INTRAVENOUS | Status: AC
  Filled 2020-04-27: qty 3000

## 2020-04-27 MED ORDER — CHLORHEXIDINE GLUCONATE 0.12 % MT SOLN
15.0000 mL | OROMUCOSAL | Status: AC
  Administered 2020-04-27: 15 mL via OROMUCOSAL

## 2020-04-27 MED ORDER — DEXTROSE 50 % IV SOLN: 0.0000 mL | INTRAVENOUS | Status: DC | PRN

## 2020-04-27 MED ORDER — DEXMEDETOMIDINE HCL IN NACL 400 MCG/100ML IV SOLN
0.0000 ug/kg/h | INTRAVENOUS | Status: DC
  Administered 2020-04-28: 0.4 ug/kg/h via INTRAVENOUS
  Filled 2020-04-27: qty 100

## 2020-04-27 MED ORDER — VANCOMYCIN HCL IN DEXTROSE 1-5 GM/200ML-% IV SOLN
1000.0000 mg | Freq: Once | INTRAVENOUS | Status: AC
  Administered 2020-04-27: 1000 mg via INTRAVENOUS
  Filled 2020-04-27: qty 200

## 2020-04-27 MED ORDER — SODIUM BICARBONATE 8.4 % IV SOLN
25.0000 meq | Freq: Once | INTRAVENOUS | Status: AC
  Administered 2020-04-27: 25 meq via INTRAVENOUS

## 2020-04-27 MED ORDER — ORAL CARE MOUTH RINSE
15.0000 mL | OROMUCOSAL | Status: DC
  Administered 2020-04-27 – 2020-04-28 (×7): 15 mL via OROMUCOSAL

## 2020-04-27 MED ORDER — MIDAZOLAM HCL (PF) 10 MG/2ML IJ SOLN
INTRAMUSCULAR | Status: AC
  Filled 2020-04-27: qty 2

## 2020-04-27 MED ORDER — PHENYLEPHRINE 40 MCG/ML (10ML) SYRINGE FOR IV PUSH (FOR BLOOD PRESSURE SUPPORT)
PREFILLED_SYRINGE | INTRAVENOUS | Status: AC
  Filled 2020-04-27: qty 10

## 2020-04-27 MED ORDER — ARTIFICIAL TEARS OPHTHALMIC OINT
TOPICAL_OINTMENT | OPHTHALMIC | Status: AC
  Filled 2020-04-27: qty 3.5

## 2020-04-27 MED ORDER — METOPROLOL TARTRATE 12.5 MG HALF TABLET
12.5000 mg | ORAL_TABLET | Freq: Two times a day (BID) | ORAL | Status: DC
  Administered 2020-04-29 (×2): 12.5 mg via ORAL
  Filled 2020-04-27 (×3): qty 1

## 2020-04-27 MED ORDER — NITROGLYCERIN IN D5W 200-5 MCG/ML-% IV SOLN: 0.0000 ug/min | INTRAVENOUS | Status: DC

## 2020-04-27 MED ORDER — EPINEPHRINE HCL 5 MG/250ML IV SOLN IN NS: 0.0000 ug/min | INTRAVENOUS | Status: DC

## 2020-04-27 MED ORDER — ALBUMIN HUMAN 5 % IV SOLN: INTRAVENOUS | Status: DC | PRN

## 2020-04-27 MED ORDER — SODIUM CHLORIDE 0.45 % IV SOLN: INTRAVENOUS | Status: DC | PRN

## 2020-04-27 MED ORDER — PROPOFOL 10 MG/ML IV BOLUS
INTRAVENOUS | Status: AC
  Filled 2020-04-27: qty 20

## 2020-04-27 MED ORDER — VANCOMYCIN HCL 1000 MG IV SOLR
INTRAVENOUS | Status: DC | PRN
  Administered 2020-04-27: 2000 mg via TOPICAL
  Administered 2020-04-27: 1000 mg via TOPICAL

## 2020-04-27 MED ORDER — OXYCODONE HCL 5 MG PO TABS
5.0000 mg | ORAL_TABLET | ORAL | Status: DC | PRN
  Administered 2020-04-28: 10 mg via ORAL
  Filled 2020-04-27: qty 2

## 2020-04-27 MED ORDER — ALBUMIN HUMAN 5 % IV SOLN
250.0000 mL | INTRAVENOUS | Status: AC | PRN
  Administered 2020-04-27 – 2020-04-28 (×4): 12.5 g via INTRAVENOUS
  Filled 2020-04-27 (×2): qty 250

## 2020-04-27 MED ORDER — STERILE WATER FOR INJECTION IJ SOLN
INTRAMUSCULAR | Status: AC
  Filled 2020-04-27: qty 10

## 2020-04-27 MED ORDER — PLATELET RICH PLASMA OPTIME
Status: DC | PRN
  Administered 2020-04-27: 10 mL

## 2020-04-27 MED ORDER — LACTATED RINGERS IV SOLN
500.0000 mL | Freq: Once | INTRAVENOUS | Status: AC | PRN
  Administered 2020-04-28: 500 mL via INTRAVENOUS

## 2020-04-27 MED ORDER — PROTAMINE SULFATE 10 MG/ML IV SOLN
INTRAVENOUS | Status: DC | PRN
  Administered 2020-04-27: 240 mg via INTRAVENOUS

## 2020-04-27 MED ORDER — BUPIVACAINE HCL (PF) 0.5 % IJ SOLN
INTRAMUSCULAR | Status: AC
  Filled 2020-04-27: qty 30

## 2020-04-27 MED ORDER — SODIUM CHLORIDE 0.9 % IV SOLN: 250.0000 mL | INTRAVENOUS | Status: DC

## 2020-04-27 MED ORDER — PLATELET POOR PLASMA OPTIME
Status: DC | PRN
  Administered 2020-04-27: 10 mL

## 2020-04-27 MED ORDER — LACTATED RINGERS IV SOLN: INTRAVENOUS | Status: DC

## 2020-04-27 MED ORDER — MAGNESIUM SULFATE 4 GM/100ML IV SOLN
4.0000 g | Freq: Once | INTRAVENOUS | Status: AC
  Administered 2020-04-27: 4 g via INTRAVENOUS
  Filled 2020-04-27: qty 100

## 2020-04-27 MED ORDER — TRAMADOL HCL 50 MG PO TABS
50.0000 mg | ORAL_TABLET | ORAL | Status: DC | PRN
  Administered 2020-04-28 – 2020-04-29 (×2): 50 mg via ORAL
  Administered 2020-04-29: 100 mg via ORAL
  Administered 2020-04-29: 50 mg via ORAL
  Filled 2020-04-27: qty 2
  Filled 2020-04-27 (×3): qty 1

## 2020-04-27 MED ORDER — ASPIRIN EC 325 MG PO TBEC: 325.0000 mg | DELAYED_RELEASE_TABLET | Freq: Every day | ORAL | Status: DC

## 2020-04-27 MED ORDER — MIDAZOLAM HCL 5 MG/5ML IJ SOLN
INTRAMUSCULAR | Status: DC | PRN
  Administered 2020-04-27 (×2): 2 mg via INTRAVENOUS
  Administered 2020-04-27: 1 mg via INTRAVENOUS

## 2020-04-27 MED ORDER — ROSUVASTATIN CALCIUM 20 MG PO TABS
40.0000 mg | ORAL_TABLET | Freq: Every day | ORAL | Status: DC
  Administered 2020-04-28 – 2020-05-10 (×13): 40 mg via ORAL
  Filled 2020-04-27 (×13): qty 2

## 2020-04-27 MED ORDER — ROCURONIUM BROMIDE 10 MG/ML (PF) SYRINGE
PREFILLED_SYRINGE | INTRAVENOUS | Status: AC
  Filled 2020-04-27: qty 20

## 2020-04-27 MED ORDER — METOPROLOL TARTRATE 25 MG/10 ML ORAL SUSPENSION
12.5000 mg | Freq: Two times a day (BID) | ORAL | Status: DC
  Administered 2020-04-27: 12.5 mg
  Filled 2020-04-27: qty 5

## 2020-04-27 MED ORDER — ACETAMINOPHEN 160 MG/5ML PO SOLN: 650.0000 mg | Freq: Once | ORAL | Status: AC

## 2020-04-27 MED ORDER — CHLORHEXIDINE GLUCONATE CLOTH 2 % EX PADS
6.0000 | MEDICATED_PAD | Freq: Every day | CUTANEOUS | Status: DC
  Administered 2020-04-27 – 2020-04-30 (×4): 6 via TOPICAL

## 2020-04-27 MED ORDER — SODIUM CHLORIDE 0.9 % IV SOLN: INTRAVENOUS | Status: DC

## 2020-04-27 MED ORDER — HEPARIN SODIUM (PORCINE) 1000 UNIT/ML IJ SOLN
INTRAMUSCULAR | Status: AC
  Filled 2020-04-27: qty 1

## 2020-04-27 MED ORDER — METOCLOPRAMIDE HCL 5 MG/ML IJ SOLN
10.0000 mg | Freq: Four times a day (QID) | INTRAMUSCULAR | Status: DC
  Administered 2020-04-27 – 2020-04-30 (×13): 10 mg via INTRAVENOUS
  Filled 2020-04-27 (×12): qty 2

## 2020-04-27 MED ORDER — EPHEDRINE 5 MG/ML INJ
INTRAVENOUS | Status: AC
  Filled 2020-04-27: qty 10

## 2020-04-27 MED ORDER — PROPOFOL 10 MG/ML IV BOLUS
INTRAVENOUS | Status: DC | PRN
  Administered 2020-04-27: 20 mg via INTRAVENOUS

## 2020-04-27 MED ORDER — PROTAMINE SULFATE 10 MG/ML IV SOLN
INTRAVENOUS | Status: AC
  Filled 2020-04-27: qty 25

## 2020-04-27 MED ORDER — BUPIVACAINE LIPOSOME 1.3 % IJ SUSP
INTRAMUSCULAR | Status: DC | PRN
  Administered 2020-04-27: 50 mL

## 2020-04-27 MED ORDER — SODIUM CHLORIDE 0.9% FLUSH: 3.0000 mL | INTRAVENOUS | Status: DC | PRN

## 2020-04-27 MED ORDER — SODIUM CHLORIDE 0.9% FLUSH
3.0000 mL | Freq: Two times a day (BID) | INTRAVENOUS | Status: DC
  Administered 2020-04-28 – 2020-04-30 (×5): 3 mL via INTRAVENOUS

## 2020-04-27 MED ORDER — SODIUM BICARBONATE 8.4 % IV SOLN
INTRAVENOUS | Status: AC
  Administered 2020-04-27: 50 meq via INTRAVENOUS
  Filled 2020-04-27: qty 50

## 2020-04-27 MED ORDER — HEPARIN SODIUM (PORCINE) 1000 UNIT/ML IJ SOLN
INTRAMUSCULAR | Status: DC | PRN
  Administered 2020-04-27: 24000 [IU] via INTRAVENOUS

## 2020-04-27 MED ORDER — POTASSIUM CHLORIDE 10 MEQ/100ML IV SOLN: 10.0000 meq | INTRAVENOUS | Status: DC

## 2020-04-27 SURGICAL SUPPLY — 102 items
ADAPTER CARDIO PERF ANTE/RETRO (ADAPTER) ×4 IMPLANT
ADH SKN CLS APL DERMABOND .7 (GAUZE/BANDAGES/DRESSINGS) ×3
ADPR PRFSN 84XANTGRD RTRGD (ADAPTER) ×3
APPLIER CLIP 9.375 SM OPEN (CLIP) ×4
APR CLP SM 9.3 20 MLT OPN (CLIP) ×3
BAG DECANTER FOR FLEXI CONT (MISCELLANEOUS) ×4 IMPLANT
BLADE CLIPPER SURG (BLADE) ×4 IMPLANT
BLADE STERNUM SYSTEM 6 (BLADE) ×4 IMPLANT
BLADE SURG 15 STRL LF DISP TIS (BLADE) ×3 IMPLANT
BLADE SURG 15 STRL SS (BLADE) ×8
BNDG ELASTIC 4X5.8 VLCR STR LF (GAUZE/BANDAGES/DRESSINGS) ×5 IMPLANT
BNDG ELASTIC 6X5.8 VLCR STR LF (GAUZE/BANDAGES/DRESSINGS) ×4 IMPLANT
BNDG GAUZE ELAST 4 BULKY (GAUZE/BANDAGES/DRESSINGS) ×5 IMPLANT
CANISTER SUCT 3000ML PPV (MISCELLANEOUS) ×4 IMPLANT
CANISTER WOUNDNEG PRESSURE 500 (CANNISTER) ×1 IMPLANT
CANNULA DUAL 20GA X18 (CANNULA) ×2 IMPLANT
CANNULA NON VENT 20FR 12 (CANNULA) ×1 IMPLANT
CATH CPB KIT HENDRICKSON (MISCELLANEOUS) ×4 IMPLANT
CATH ROBINSON RED A/P 18FR (CATHETERS) ×8 IMPLANT
CLIP APPLIE 9.375 SM OPEN (CLIP) ×3 IMPLANT
CLIP RETRACTION 3.0MM CORONARY (MISCELLANEOUS) ×5 IMPLANT
COVER MAYO STAND STRL (DRAPES) ×4 IMPLANT
DEFOGGER ANTIFOG KIT (MISCELLANEOUS) ×1 IMPLANT
DERMABOND ADVANCED (GAUZE/BANDAGES/DRESSINGS) ×1
DERMABOND ADVANCED .7 DNX12 (GAUZE/BANDAGES/DRESSINGS) ×3 IMPLANT
DRAIN CHANNEL 28F RND 3/8 FF (WOUND CARE) ×12 IMPLANT
DRAPE CARDIOVASCULAR INCISE (DRAPES) ×4
DRAPE EXTREMITY T 121X128X90 (DISPOSABLE) ×4 IMPLANT
DRAPE HALF SHEET 40X57 (DRAPES) ×4 IMPLANT
DRAPE SLUSH/WARMER DISC (DRAPES) ×4 IMPLANT
DRAPE SRG 135X102X78XABS (DRAPES) ×3 IMPLANT
DRESSING PREVENA PLUS CUSTOM (GAUZE/BANDAGES/DRESSINGS) IMPLANT
DRSG AQUACEL AG ADV 3.5X14 (GAUZE/BANDAGES/DRESSINGS) ×4 IMPLANT
DRSG PREVENA PLUS CUSTOM (GAUZE/BANDAGES/DRESSINGS) ×4
ELECT BLADE 4.0 EZ CLEAN MEGAD (MISCELLANEOUS) ×4
ELECT CAUTERY BLADE 6.4 (BLADE) ×4 IMPLANT
ELECT REM PT RETURN 9FT ADLT (ELECTROSURGICAL) ×8
ELECTRODE BLDE 4.0 EZ CLN MEGD (MISCELLANEOUS) IMPLANT
ELECTRODE REM PT RTRN 9FT ADLT (ELECTROSURGICAL) ×6 IMPLANT
FELT TEFLON 1X6 (MISCELLANEOUS) ×7 IMPLANT
GAUZE SPONGE 4X4 12PLY STRL (GAUZE/BANDAGES/DRESSINGS) ×9 IMPLANT
GAUZE SPONGE 4X4 12PLY STRL LF (GAUZE/BANDAGES/DRESSINGS) ×3 IMPLANT
GEL ULTRASOUND 20GR AQUASONIC (MISCELLANEOUS) ×4 IMPLANT
GLOVE BIO SURGEON STRL SZ 6.5 (GLOVE) ×6 IMPLANT
GLOVE NEODERM STRL 7.5  LF PF (GLOVE) ×9
GLOVE NEODERM STRL 7.5 LF PF (GLOVE) ×9 IMPLANT
GLOVE SURG NEODERM 7.5  LF PF (GLOVE) ×3
GOWN STRL REUS W/ TWL LRG LVL3 (GOWN DISPOSABLE) ×12 IMPLANT
GOWN STRL REUS W/TWL LRG LVL3 (GOWN DISPOSABLE) ×48
HEMOSTAT POWDER SURGIFOAM 1G (HEMOSTASIS) ×8 IMPLANT
INSERT FOGARTY XLG (MISCELLANEOUS) ×1 IMPLANT
INSERT SUTURE HOLDER (MISCELLANEOUS) ×4 IMPLANT
KIT APPLICATOR RATIO 11:1 (KITS) ×1 IMPLANT
KIT BASIN OR (CUSTOM PROCEDURE TRAY) ×4 IMPLANT
KIT SUCTION CATH 14FR (SUCTIONS) ×4 IMPLANT
KIT TURNOVER KIT B (KITS) ×4 IMPLANT
KIT VASOVIEW HEMOPRO 2 VH 4000 (KITS) ×4 IMPLANT
MARKER GRAFT CORONARY BYPASS (MISCELLANEOUS) ×12 IMPLANT
NDL 18GX1X1/2 (RX/OR ONLY) (NEEDLE) ×3 IMPLANT
NEEDLE 18GX1X1/2 (RX/OR ONLY) (NEEDLE) ×8 IMPLANT
NS IRRIG 1000ML POUR BTL (IV SOLUTION) ×20 IMPLANT
PACK E OPEN HEART (SUTURE) ×4 IMPLANT
PACK OPEN HEART (CUSTOM PROCEDURE TRAY) ×4 IMPLANT
PACK PLATELET PROCEDURE 60 (MISCELLANEOUS) ×1 IMPLANT
PACK SPY-PHI (KITS) ×1 IMPLANT
PAD ARMBOARD 7.5X6 YLW CONV (MISCELLANEOUS) ×8 IMPLANT
PAD ELECT DEFIB RADIOL ZOLL (MISCELLANEOUS) ×4 IMPLANT
PENCIL BUTTON HOLSTER BLD 10FT (ELECTRODE) ×5 IMPLANT
POSITIONER HEAD DONUT 9IN (MISCELLANEOUS) ×4 IMPLANT
POWDER SURGICEL 3.0 GRAM (HEMOSTASIS) ×4 IMPLANT
PUNCH AORTIC ROTATE 4.5MM 8IN (MISCELLANEOUS) ×1 IMPLANT
SEALANT SURG COSEAL 8ML (VASCULAR PRODUCTS) ×1 IMPLANT
SET CARDIOPLEGIA MPS 5001102 (MISCELLANEOUS) ×1 IMPLANT
SHEARS HARMONIC 9CM CVD (BLADE) ×1 IMPLANT
STAPLER VISISTAT 35W (STAPLE) ×2 IMPLANT
STOPCOCK 4 WAY LG BORE MALE ST (IV SETS) ×1 IMPLANT
SUPPORT HEART JANKE-BARRON (MISCELLANEOUS) ×4 IMPLANT
SUT BONE WAX W31G (SUTURE) ×4 IMPLANT
SUT MNCRL AB 3-0 PS2 18 (SUTURE) ×8 IMPLANT
SUT MNCRL AB 4-0 PS2 18 (SUTURE) ×1 IMPLANT
SUT PDS AB 1 CTX 36 (SUTURE) ×8 IMPLANT
SUT PROLENE 3 0 SH DA (SUTURE) ×5 IMPLANT
SUT PROLENE 6 0 C 1 30 (SUTURE) ×16 IMPLANT
SUT PROLENE 7 0 BV1 MDA (SUTURE) ×1 IMPLANT
SUT PROLENE BLUE 7 0 (SUTURE) ×4 IMPLANT
SUT SILK  1 MH (SUTURE) ×4
SUT SILK 1 MH (SUTURE) IMPLANT
SUT STEEL 6MS V (SUTURE) ×4 IMPLANT
SUT STEEL SZ 6 DBL 3X14 BALL (SUTURE) ×4 IMPLANT
SUT VIC AB 2-0 CT1 27 (SUTURE) ×8
SUT VIC AB 2-0 CT1 TAPERPNT 27 (SUTURE) IMPLANT
SYR 30ML LL (SYRINGE) ×5 IMPLANT
SYR 3ML LL SCALE MARK (SYRINGE) ×4 IMPLANT
SYSTEM SAHARA CHEST DRAIN ATS (WOUND CARE) ×4 IMPLANT
TOWEL GREEN STERILE (TOWEL DISPOSABLE) ×4 IMPLANT
TOWEL GREEN STERILE FF (TOWEL DISPOSABLE) ×4 IMPLANT
TRAY CATH LUMEN 1 20CM STRL (SET/KITS/TRAYS/PACK) ×1 IMPLANT
TRAY FOLEY SLVR 16FR TEMP STAT (SET/KITS/TRAYS/PACK) ×4 IMPLANT
TUBING ART PRESS 48 MALE/FEM (TUBING) ×2 IMPLANT
TUBING LAP HI FLOW INSUFFLATIO (TUBING) ×4 IMPLANT
UNDERPAD 30X36 HEAVY ABSORB (UNDERPADS AND DIAPERS) ×5 IMPLANT
WATER STERILE IRR 1000ML POUR (IV SOLUTION) ×8 IMPLANT

## 2020-04-27 NOTE — Op Note (Signed)
CARDIOTHORACIC SURGERY OPERATIVE NOTE  Date of Procedure: 04/27/2020  Preoperative Diagnosis: Severe 3-vessel Coronary Artery Disease with unstable angina  Postoperative Diagnosis: Same  Procedure:    Coronary Artery Bypass Grafting x 5  Left Internal Mammary Artery to Distal Left Anterior Descending Coronary Artery; Saphenous Vein Graft to right posterior Descending Coronary Artery and posterior lateral artery as a sequenced graft; left radial artery graft to ramus intermedius and first obtuse Marginal Branch of Left Circumflex Coronary Artery as a sequenced graft; Endoscopic Vein Harvest from right thigh Open left radial artery harvesting Completion graft surveillance with indocyanine green fluorescence imaging   Surgeon: B.  Murvin Natal, MD  Assistant: T.  Harriet Pho, PA-C  Anesthesia: General  Operative Findings:  Preserved left ventricular systolic function  Good quality left internal mammary artery conduit  Good quality saphenous vein conduit and left radial artery conduit  Good quality target vessels for grafting; duplicated LAD system    BRIEF CLINICAL NOTE AND INDICATIONS FOR SURGERY  74 year old lady experienced exertional pain recently.  This was assessed with a CT coronary angiogram which demonstrated or suggested diffuse CAD.  This was confirmed with left heart catheterization.  She presents for CABG having undergone a thorough screening examination   DETAILS OF THE OPERATIVE PROCEDURE  Preparation:  The patient is brought to the operating room on the above mentioned date and central monitoring was established by the anesthesia team including placement of Swan-Ganz catheter and radial arterial line. The patient is placed in the supine position on the operating table.  Intravenous antibiotics are administered. General endotracheal anesthesia is induced uneventfully. A Foley catheter is placed.  Baseline transesophageal echocardiogram was performed.  Findings were  notable for preserved left ventricular function  The patient's chest, abdomen, both groins, the left upper extremity, and both lower extremities are prepared and draped in a sterile manner. A time out procedure is performed.   Surgical Approach and Conduit Harvest:  Attention is first turned to the left forearm where an incision was made overlying a palpable left radial pulse.  Open left radial artery harvesting is undertaken with a harmonic scalpel.  Once the vessel is removed the forearm incision is closed in layers and the arm is retucked at the side. A median sternotomy incision was performed and the left internal mammary artery is dissected from the chest wall and prepared for bypass grafting. The left internal mammary artery is notably good quality conduit. Simultaneously, the greater saphenous vein is obtained from the patient's right thigh using endoscopic vein harvest technique. The saphenous vein is notably good quality conduit. After removal of the saphenous vein, the small surgical incisions in the lower extremity are closed with absorbable suture. Following systemic heparinization, the left internal mammary artery was transected distally noted to have excellent flow.   Extracorporeal Cardiopulmonary Bypass and Myocardial Protection:  The pericardium is opened. The ascending aorta is normal in appearance. The ascending aorta and the right atrium are cannulated for cardiopulmonary bypass.  Adequate heparinization is verified.    The entire pre-bypass portion of the operation was notable for stable hemodynamics.  Cardiopulmonary bypass was begun and the surface of the heart is inspected. Distal target vessels are selected for coronary artery bypass grafting. A cardioplegia cannula is placed in the ascending aorta.    The patient is allowed to cool passively to 34C systemic temperature.  The aortic cross clamp is applied and cold blood cardioplegia is delivered initially in an antegrade  fashion through the aortic root.  Iced saline slush is applied for topical hypothermia.  The initial cardioplegic arrest is rapid with early diastolic arrest.  Repeat doses of cardioplegia are administered intermittently throughout the entire cross clamp portion of the operation through the aortic root and through subsequently placed vein grafts in order to maintain completely flat electrocardiogram.  Coronary Artery Bypass Grafting:   The posterior descending branch and the posterior lateral artery branch of the right coronary artery were grafted using a reversed saphenous vein graft in a sequenced fashion.  At the site of distal anastomoses the target vessels were good quality and measured approximately 1.5 mm in diameter.  The first obtuse marginal branch of the left circumflex coronary artery and the ramus intermedius vessel were grafted using the left radial artery graft in a sequenced fashion.  At the site of distal anastomosis the target vessel was good quality and measured approximately 1.5 mm in diameter.  The distal left anterior coronary artery was grafted with the left internal mammary artery in an end-to-side fashion.  At the site of distal anastomosis the target vessel was good quality and measured approximately 1.5 mm in diameter. Anastomotic patency and runoff was confirmed with indocyanine green fluorescence imaging (SPY).  The proximal graft anastomoses were placed directly to the ascending aorta prior to removal of the aortic cross clamp.  A hotshot dose of cardioplegia was given down the aortic root and the aortic cross-clamp was removed subsequently   Procedure Completion:  All proximal and distal coronary anastomoses were inspected for hemostasis and appropriate graft orientation. Epicardial pacing wires are fixed to the right ventricular outflow tract and to the right atrial appendage. The patient is rewarmed to 37C temperature. The patient is weaned and disconnected from  cardiopulmonary bypass.  The patient's rhythm at separation from bypass was normal sinus.  The patient was weaned from cardiopulmonary bypass with low-dose inotropic support.  Followup transesophageal echocardiogram performed after separation from bypass revealed no changes from the preoperative exam.  The aortic and venous cannula were removed uneventfully. Protamine was administered to reverse the anticoagulation. The mediastinum and pleural space were inspected for hemostasis and irrigated with saline solution. The mediastinum and both pleural spaces were drained using fluted chest tubes placed through separate stab incisions inferiorly.  The soft tissues anterior to the aorta were reapproximated loosely. The sternum is closed with double strength sternal wire. The soft tissues anterior to the sternum were closed in multiple layers and the skin is closed with a running subcuticular skin closure.  The post-bypass portion of the operation was notable for stable rhythm and hemodynamics.     Disposition:  The patient tolerated the procedure well and is transported to the surgical intensive care in stable condition. There are no intraoperative complications. All sponge instrument and needle counts are verified correct at completion of the operation.    Jayme Cloud, MD 04/27/2020 2:22 PM

## 2020-04-27 NOTE — Progress Notes (Signed)
Dr. Servando Snare paged regarding patient's pre-extubation attempt ABG showing respiratory acidosis. RN given verbal order to increase vent RR and retake ABG in an hour.Patient failed vital capacity and NIF test.

## 2020-04-27 NOTE — Progress Notes (Signed)
Placed pt back on full support due to ABG results, will attempt to wean again around midnight.

## 2020-04-27 NOTE — Transfer of Care (Signed)
Immediate Anesthesia Transfer of Care Note  Patient: Jessyka Austria Simien  Procedure(s) Performed: CORONARY ARTERY BYPASS GRAFTING (CABG) TIMES FIVE USING LEFT INTERNAL MAMMARY ARTERY, LEFT HARVESTED RADIAL ARTERY, RIGHT GREATER SAPHENOUS VEIN HARVESTED ENDOSCOPICALLY. (N/A Chest) LEFT RADIAL ARTERY HARVEST (Left Arm Lower) TRANSESOPHAGEAL ECHOCARDIOGRAM (TEE) (N/A ) INDOCYANINE GREEN FLUORESCENCE IMAGING (ICG) (N/A )  Patient Location: ICU  Anesthesia Type:General  Level of Consciousness: sedated and Patient remains intubated per anesthesia plan  Airway & Oxygen Therapy: Patient remains intubated per anesthesia plan and Patient placed on Ventilator (see vital sign flow sheet for setting)  Post-op Assessment: Report given to RN and Post -op Vital signs reviewed and stable  Post vital signs: Reviewed and stable  Last Vitals:  Vitals Value Taken Time  BP    Temp 35.9 C 04/27/20 1440  Pulse 89 04/27/20 1440  Resp 12 04/27/20 1440  SpO2 99 % 04/27/20 1440  Vitals shown include unvalidated device data.  Last Pain:  Vitals:   04/27/20 0350  TempSrc: Oral  PainSc:          Complications: No complications documented.

## 2020-04-27 NOTE — Progress Notes (Signed)
Pt placed back on full vent support. Pt unable to do NIF and VC was 359ml. RN notified MD.

## 2020-04-27 NOTE — Anesthesia Postprocedure Evaluation (Signed)
Anesthesia Post Note  Patient: Katrina Castro  Procedure(s) Performed: CORONARY ARTERY BYPASS GRAFTING (CABG) TIMES FIVE USING LEFT INTERNAL MAMMARY ARTERY, LEFT HARVESTED RADIAL ARTERY, RIGHT GREATER SAPHENOUS VEIN HARVESTED ENDOSCOPICALLY. (N/A Chest) LEFT RADIAL ARTERY HARVEST (Left Arm Lower) TRANSESOPHAGEAL ECHOCARDIOGRAM (TEE) (N/A ) INDOCYANINE GREEN FLUORESCENCE IMAGING (ICG) (N/A )     Patient location during evaluation: SICU Anesthesia Type: General Level of consciousness: responds to stimulation, sedated and patient remains intubated per anesthesia plan Pain management: pain level controlled Vital Signs Assessment: post-procedure vital signs reviewed and stable Respiratory status: patient on ventilator - see flowsheet for VS and patient remains intubated per anesthesia plan (weaning vent) Cardiovascular status: stable (still requiring Epi support) Postop Assessment: no apparent nausea or vomiting Anesthetic complications: no   No complications documented.  Last Vitals:  Vitals:   04/27/20 1700 04/27/20 1736  BP: (!) 81/53   Pulse: 80 80  Resp: 17 17  Temp: (!) 35.4 C (!) 35.3 C  SpO2: 100% 100%    Last Pain:  Vitals:   04/27/20 1500  TempSrc: Core  PainSc:                  Ailynn Gow,E. Kutter Schnepf

## 2020-04-27 NOTE — Anesthesia Procedure Notes (Signed)
Procedure Name: Intubation Date/Time: 04/27/2020 9:02 AM Performed by: Candis Shine, CRNA Pre-anesthesia Checklist: Patient identified, Emergency Drugs available, Suction available and Patient being monitored Patient Re-evaluated:Patient Re-evaluated prior to induction Oxygen Delivery Method: Circle System Utilized Preoxygenation: Pre-oxygenation with 100% oxygen Induction Type: IV induction Ventilation: Mask ventilation without difficulty and Oral airway inserted - appropriate to patient size Laryngoscope Size: Mac and 3 Grade View: Grade I Tube type: Oral Tube size: 7.5 mm Number of attempts: 1 Airway Equipment and Method: Stylet and Oral airway Placement Confirmation: ETT inserted through vocal cords under direct vision,  positive ETCO2 and breath sounds checked- equal and bilateral Secured at: 21 cm Tube secured with: Tape Dental Injury: Teeth and Oropharynx as per pre-operative assessment

## 2020-04-27 NOTE — Anesthesia Procedure Notes (Signed)
Arterial Line Insertion Start/End11/03/2020 7:50 AM Performed by: Candis Shine, CRNA, CRNA  Patient location: Pre-op. Preanesthetic checklist: patient identified, IV checked, site marked, risks and benefits discussed, surgical consent, monitors and equipment checked, pre-op evaluation, timeout performed and anesthesia consent Lidocaine 1% used for infiltration Right, radial was placed Catheter size: 20 G Hand hygiene performed  and maximum sterile barriers used   Attempts: 1 Procedure performed without using ultrasound guided technique. Following insertion, dressing applied and Biopatch. Post procedure assessment: normal and unchanged  Patient tolerated the procedure well with no immediate complications.

## 2020-04-27 NOTE — Brief Op Note (Signed)
04/25/2020 - 04/27/2020  12:54 PM  PATIENT:  Katrina Castro  74 y.o. female  PRE-OPERATIVE DIAGNOSIS:  CAD  POST-OPERATIVE DIAGNOSIS:  CAD  PROCEDURE:  Procedure(s): CORONARY ARTERY BYPASS GRAFTING (CABG) TIMES FIVE USING LEFT INTERNAL MAMMARY ARTERY, LEFT HARVESTED RADIAL ARTERY, RIGHT GREATER SAPHENOUS VEIN HARVESTED ENDOSCOPICALLY. (N/A) LEFT RADIAL ARTERY HARVEST (Left) TRANSESOPHAGEAL ECHOCARDIOGRAM (TEE) (N/A) INDOCYANINE GREEN FLUORESCENCE IMAGING (ICG) (N/A)  SVG harvest time 50 minutes, 10 minute prep LIMA to LAD SVG to PL and PDA Radial to OM1 and Ramus  SURGEON:  Surgeon(s) and Role:    * [name], MD - Primary  PHYSICIAN ASSISTANT:  Nicholes Rough, PA-C   ANESTHESIA:   general  EBL:  Per anes   BLOOD ADMINISTERED:none  DRAINS: ROUTINE   LOCAL MEDICATIONS USED:  BUPIVICAINE   SPECIMEN:  No Specimen  DISPOSITION OF SPECIMEN:  PATHOLOGY  COUNTS:  YES  DICTATION: .Dragon Dictation  PLAN OF CARE: Admit to inpatient   PATIENT DISPOSITION:  ICU - intubated and hemodynamically stable.   Delay start of Pharmacological VTE agent (>24hrs) due to surgical blood loss or risk of bleeding: yes

## 2020-04-27 NOTE — H&P (Signed)
History and Physical Interval Note:  04/27/2020 7:59 AM  Katrina Castro  has presented today for surgery, with the diagnosis of CAD.  The various methods of treatment have been discussed with the patient and family. After consideration of risks, benefits and other options for treatment, the patient has consented to  Procedure(s) with comments: CORONARY ARTERY BYPASS GRAFTING (CABG) (N/A) - POSSIBLE BIMA possible RADIAL ARTERY HARVEST (Left) TRANSESOPHAGEAL ECHOCARDIOGRAM (TEE) (N/A) INDOCYANINE GREEN FLUORESCENCE IMAGING (ICG) (N/A) MAZE (N/A) as a surgical intervention.  The patient's history has been reviewed, patient examined, no change in status, stable for surgery.  I have reviewed the patient's chart and labs.  Questions were answered to the patient's satisfaction.     Katrina Castro

## 2020-04-27 NOTE — Progress Notes (Signed)
Paged Gerhardt MD per  Regarding pt's ABG during this second attempt at extubation, pt passed the NIV but is still acidotic. MD ordered one amp of bicarb, return pt to full ventilation mode, and recheck ABG in an hour.

## 2020-04-27 NOTE — Anesthesia Procedure Notes (Signed)
Central Venous Catheter Insertion Performed by: Annye Asa, MD, anesthesiologist Start/End11/03/2020 7:29 AM, 04/27/2020 7:41 AM Patient location: Pre-op. Preanesthetic checklist: patient identified, IV checked, risks and benefits discussed, surgical consent, monitors and equipment checked, pre-op evaluation, timeout performed and anesthesia consent Position: Trendelenburg Lidocaine 1% used for infiltration and patient sedated Hand hygiene performed , maximum sterile barriers used  and Seldinger technique used Catheter size: 8.5 Fr PA cath was placed.Sheath introducer Swan type:thermodilution Procedure performed using ultrasound guided technique. Ultrasound Notes:anatomy identified, needle tip was noted to be adjacent to the nerve/plexus identified, no ultrasound evidence of intravascular and/or intraneural injection and image(s) printed for medical record Attempts: 1 Following insertion, line sutured, dressing applied and Biopatch. Post procedure assessment: blood return through all ports, free fluid flow and no air  Additional procedure comments: PA catheter:  Routine monitors. Timeout, sterile prep, drape, FBP R neck.  Trendelenburg position.  1% Lido local, finder and trocar carotid,1st pass, removed, pressure held and then RIJ.  Cordis placed over J wire. PA catheter in easily.  Sterile dressing applied.  Patient tolerated well, VSS.  Jenita Seashore, MD.

## 2020-04-27 NOTE — Progress Notes (Signed)
Report called to CRNA in [place]. Order to stop heparin drip on call to OR. Patient informed of transfer to OR. Will continue to monitor.

## 2020-04-27 NOTE — Progress Notes (Signed)
Patient ID: Katrina Castro, female   DOB: 1946/02/15, 74 y.o.   MRN: [id number] EVENING ROUNDS NOTE :     [address]       [place] [address]             [phone]                 Day of Surgery Procedure(s) (LRB): CORONARY ARTERY BYPASS GRAFTING (CABG) TIMES FIVE USING LEFT INTERNAL MAMMARY ARTERY, LEFT HARVESTED RADIAL ARTERY, RIGHT GREATER SAPHENOUS VEIN HARVESTED ENDOSCOPICALLY. (N/A) LEFT RADIAL ARTERY HARVEST (Left) TRANSESOPHAGEAL ECHOCARDIOGRAM (TEE) (N/A) INDOCYANINE GREEN FLUORESCENCE IMAGING (ICG) (N/A)  Total Length of Stay:  LOS: 2 days  BP 113/68 (BP Location: Left Arm)   Pulse 68   Temp 97.6 F (36.4 C) (Oral)   Resp 13   Ht 5\' 3"  (1.6 m)   Wt 67.1 kg   SpO2 99%   BMI 26.20 kg/m   .Intake/Output      11/09 0701 - 11/10 0700 11/10 0701 - 11/11 0700   P.O. 120    I.V. (mL/kg) 79.7 (1.2) 2300 (34.3)   Blood  330   IV Piggyback 0 450   Total Intake(mL/kg) 199.7 (3) 3080 (45.9)   Urine (mL/kg/hr) 400 (0.2) 1055 (1.7)   Blood  700   Total Output 400 1755   Net -200.3 +1325        Urine Occurrence 1 x      . sodium chloride 10 mL/hr at 04/27/20 1452  . [START ON 04/28/2020] sodium chloride    . sodium chloride    . albumin human 12.5 g (04/27/20 1554)  . cefUROXime (ZINACEF)  IV    . dexmedetomidine (PRECEDEX) IV infusion 0.7 mcg/kg/hr (04/27/20 1505)  . epinephrine    . famotidine (PEPCID) IV 20 mg (04/27/20 1452)  . insulin 7 Units/hr (04/27/20 1430)  . lactated ringers    . lactated ringers    . lactated ringers    . magnesium sulfate 4 g (04/27/20 1544)  . nitroGLYCERIN 20 mcg/min (04/27/20 1504)  . potassium chloride 10 mEq (04/27/20 1554)  . vancomycin       Lab Results  Component Value Date   WBC 14.8 (H) 04/27/2020   HGB 11.1 (L) 04/27/2020   HCT 35.9 (L) 04/27/2020   PLT 191 04/27/2020   GLUCOSE 206 (H) 04/27/2020   CHOL 138 04/26/2020   TRIG 93 04/26/2020   HDL 37 (L) 04/26/2020   LDLCALC 82 04/26/2020    ALT 22 03/25/2017   AST 21 03/25/2017   NA 141 04/27/2020   K 4.4 04/27/2020   CL 104 04/27/2020   CREATININE 0.40 (L) 04/27/2020   BUN 13 04/27/2020   CO2 23 04/27/2020   TSH 5.542 (H) 04/25/2020   INR 1.4 (H) 04/27/2020   HGBA1C 7.5 (H) 04/25/2020   Early postop Not awake yet  bp 100 ci 1.68 On epi 0.5 Not bleeding   Grace Isaac MD  Beeper [phone] Office [phone] 04/27/2020 4:11 PM

## 2020-04-28 ENCOUNTER — Encounter (HOSPITAL_COMMUNITY): Payer: Self-pay | Admitting: Cardiothoracic Surgery

## 2020-04-28 ENCOUNTER — Inpatient Hospital Stay (HOSPITAL_COMMUNITY): Payer: Medicare Other

## 2020-04-28 DIAGNOSIS — J9601 Acute respiratory failure with hypoxia: Secondary | ICD-10-CM

## 2020-04-28 LAB — MAGNESIUM
Magnesium: 2.6 mg/dL — ABNORMAL HIGH (ref 1.7–2.4)
Magnesium: 2.6 mg/dL — ABNORMAL HIGH (ref 1.7–2.4)

## 2020-04-28 LAB — BASIC METABOLIC PANEL
Anion gap: 6 (ref 5–15)
Anion gap: 10 (ref 5–15)
BUN: 9 mg/dL (ref 8–23)
BUN: 11 mg/dL (ref 8–23)
CO2: 24 mmol/L (ref 22–32)
CO2: 21 mmol/L — ABNORMAL LOW (ref 22–32)
Calcium: 7.7 mg/dL — ABNORMAL LOW (ref 8.9–10.3)
Calcium: 8.2 mg/dL — ABNORMAL LOW (ref 8.9–10.3)
Chloride: 111 mmol/L (ref 98–111)
Chloride: 107 mmol/L (ref 98–111)
Creatinine, Ser: 0.6 mg/dL (ref 0.44–1.00)
Creatinine, Ser: 0.59 mg/dL (ref 0.44–1.00)
GFR, Estimated: >60 mL/min (ref >60)
GFR, Estimated: >60 mL/min (ref >60)
Glucose, Bld: 157 mg/dL — ABNORMAL HIGH (ref 70–99)
Glucose, Bld: 149 mg/dL — ABNORMAL HIGH (ref 70–99)
Potassium: 4.1 mmol/L (ref 3.5–5.1)
Potassium: 4 mmol/L (ref 3.5–5.1)
Sodium: 141 mmol/L (ref 135–145)
Sodium: 138 mmol/L (ref 135–145)

## 2020-04-28 LAB — POCT I-STAT 7, (LYTES, BLD GAS, ICA,H+H)
Acid-base deficit: 5 mmol/L — ABNORMAL HIGH (ref 0.0–2.0)
Acid-base deficit: 3 mmol/L — ABNORMAL HIGH (ref 0.0–2.0)
Acid-base deficit: 2 mmol/L (ref 0.0–2.0)
Bicarbonate: 21.4 mmol/L (ref 20.0–28.0)
Bicarbonate: 22 mmol/L (ref 20.0–28.0)
Bicarbonate: 22.9 mmol/L (ref 20.0–28.0)
Calcium, Ion: 1.07 mmol/L — ABNORMAL LOW (ref 1.15–1.40)
Calcium, Ion: 1.09 mmol/L — ABNORMAL LOW (ref 1.15–1.40)
Calcium, Ion: 1.18 mmol/L (ref 1.15–1.40)
HCT: 28 % — ABNORMAL LOW (ref 36.0–46.0)
HCT: 26 % — ABNORMAL LOW (ref 36.0–46.0)
HCT: 28 % — ABNORMAL LOW (ref 36.0–46.0)
Hemoglobin: 9.5 g/dL — ABNORMAL LOW (ref 12.0–15.0)
Hemoglobin: 8.8 g/dL — ABNORMAL LOW (ref 12.0–15.0)
Hemoglobin: 9.5 g/dL — ABNORMAL LOW (ref 12.0–15.0)
O2 Saturation: 99 %
O2 Saturation: 99 %
O2 Saturation: 99 %
Patient temperature: 36.6
Patient temperature: 36.8
Potassium: 3.7 mmol/L (ref 3.5–5.1)
Potassium: 3.5 mmol/L (ref 3.5–5.1)
Potassium: 3.6 mmol/L (ref 3.5–5.1)
Sodium: 144 mmol/L (ref 135–145)
Sodium: 143 mmol/L (ref 135–145)
Sodium: 141 mmol/L (ref 135–145)
TCO2: 23 mmol/L (ref 22–32)
TCO2: 23 mmol/L (ref 22–32)
TCO2: 24 mmol/L (ref 22–32)
pCO2 arterial: 42.5 mmHg (ref 32.0–48.0)
pCO2 arterial: 37.6 mmHg (ref 32.0–48.0)
pCO2 arterial: 39.1 mmHg (ref 32.0–48.0)
pH, Arterial: 7.31 — ABNORMAL LOW (ref 7.350–7.450)
pH, Arterial: 7.374 (ref 7.350–7.450)
pH, Arterial: 7.375 (ref 7.350–7.450)
pO2, Arterial: 170 mmHg — ABNORMAL HIGH (ref 83.0–108.0)
pO2, Arterial: 126 mmHg — ABNORMAL HIGH (ref 83.0–108.0)
pO2, Arterial: 154 mmHg — ABNORMAL HIGH (ref 83.0–108.0)

## 2020-04-28 LAB — CBC
HCT: 33.4 % — ABNORMAL LOW (ref 36.0–46.0)
HCT: 32.5 % — ABNORMAL LOW (ref 36.0–46.0)
Hemoglobin: 10.5 g/dL — ABNORMAL LOW (ref 12.0–15.0)
Hemoglobin: 10.1 g/dL — ABNORMAL LOW (ref 12.0–15.0)
MCH: 26.6 pg (ref 26.0–34.0)
MCH: 26.5 pg (ref 26.0–34.0)
MCHC: 31.4 g/dL (ref 30.0–36.0)
MCHC: 31.1 g/dL (ref 30.0–36.0)
MCV: 84.8 fL (ref 80.0–100.0)
MCV: 85.3 fL (ref 80.0–100.0)
Platelets: 243 10*3/uL (ref 150–400)
Platelets: 193 10*3/uL (ref 150–400)
RBC: 3.94 MIL/uL (ref 3.87–5.11)
RBC: 3.81 MIL/uL — ABNORMAL LOW (ref 3.87–5.11)
RDW: 14.5 % (ref 11.5–15.5)
RDW: 14.8 % (ref 11.5–15.5)
WBC: 15.5 10*3/uL — ABNORMAL HIGH (ref 4.0–10.5)
WBC: 15.9 10*3/uL — ABNORMAL HIGH (ref 4.0–10.5)
nRBC: 0 % (ref 0.0–0.2)
nRBC: 0 % (ref 0.0–0.2)

## 2020-04-28 LAB — GLUCOSE, CAPILLARY
Glucose-Capillary: 106 mg/dL — ABNORMAL HIGH (ref 70–99)
Glucose-Capillary: 120 mg/dL — ABNORMAL HIGH (ref 70–99)
Glucose-Capillary: 122 mg/dL — ABNORMAL HIGH (ref 70–99)
Glucose-Capillary: 128 mg/dL — ABNORMAL HIGH (ref 70–99)
Glucose-Capillary: 133 mg/dL — ABNORMAL HIGH (ref 70–99)
Glucose-Capillary: 138 mg/dL — ABNORMAL HIGH (ref 70–99)
Glucose-Capillary: 143 mg/dL — ABNORMAL HIGH (ref 70–99)
Glucose-Capillary: 147 mg/dL — ABNORMAL HIGH (ref 70–99)
Glucose-Capillary: 148 mg/dL — ABNORMAL HIGH (ref 70–99)
Glucose-Capillary: 148 mg/dL — ABNORMAL HIGH (ref 70–99)
Glucose-Capillary: 149 mg/dL — ABNORMAL HIGH (ref 70–99)
Glucose-Capillary: 153 mg/dL — ABNORMAL HIGH (ref 70–99)
Glucose-Capillary: 160 mg/dL — ABNORMAL HIGH (ref 70–99)
Glucose-Capillary: 160 mg/dL — ABNORMAL HIGH (ref 70–99)
Glucose-Capillary: 161 mg/dL — ABNORMAL HIGH (ref 70–99)

## 2020-04-28 MED ORDER — PHENYLEPHRINE HCL-NACL 10-0.9 MG/250ML-% IV SOLN: 0.0000 ug/min | INTRAVENOUS | Status: DC

## 2020-04-28 MED ORDER — ORAL CARE MOUTH RINSE
15.0000 mL | Freq: Two times a day (BID) | OROMUCOSAL | Status: DC
  Administered 2020-04-29 – 2020-05-10 (×20): 15 mL via OROMUCOSAL

## 2020-04-28 MED ORDER — THIAMINE HCL 100 MG/ML IJ SOLN
Freq: Once | INTRAVENOUS | Status: AC
  Filled 2020-04-28: qty 1000

## 2020-04-28 MED ORDER — PHENYLEPHRINE HCL-NACL 20-0.9 MG/250ML-% IV SOLN: 30.0000 ug/min | INTRAVENOUS | Status: DC

## 2020-04-28 MED ORDER — INSULIN ASPART 100 UNIT/ML ~~LOC~~ SOLN
0.0000 [IU] | SUBCUTANEOUS | Status: DC
  Administered 2020-04-28 (×2): 2 [IU] via SUBCUTANEOUS
  Administered 2020-04-28: 4 [IU] via SUBCUTANEOUS
  Administered 2020-04-29 (×2): 2 [IU] via SUBCUTANEOUS

## 2020-04-28 MED ORDER — ISOSORBIDE DINITRATE 10 MG PO TABS
10.0000 mg | ORAL_TABLET | Freq: Three times a day (TID) | ORAL | Status: DC
  Administered 2020-04-28 – 2020-04-30 (×8): 10 mg via ORAL
  Filled 2020-04-28 (×10): qty 1

## 2020-04-28 MED ORDER — COLCHICINE 0.3 MG HALF TABLET
0.3000 mg | ORAL_TABLET | Freq: Two times a day (BID) | ORAL | Status: DC
  Administered 2020-04-28 – 2020-05-05 (×15): 0.3 mg via ORAL
  Filled 2020-04-28 (×16): qty 1

## 2020-04-28 NOTE — Addendum Note (Signed)
Addendum  created 04/28/20 0618 by Josephine Igo, CRNA   Order list changed

## 2020-04-28 NOTE — Progress Notes (Signed)
RN stated to hold off on attempting to wean again due to lab values being out of range.

## 2020-04-28 NOTE — Progress Notes (Signed)
EVENING ROUNDS NOTE :     [address]       [place] [address]             [phone]                 1 Day Post-Op Procedure(s) (LRB): CORONARY ARTERY BYPASS GRAFTING (CABG) TIMES FIVE USING LEFT INTERNAL MAMMARY ARTERY, LEFT HARVESTED RADIAL ARTERY, RIGHT GREATER SAPHENOUS VEIN HARVESTED ENDOSCOPICALLY. (N/A) LEFT RADIAL ARTERY HARVEST (Left) TRANSESOPHAGEAL ECHOCARDIOGRAM (TEE) (N/A) INDOCYANINE GREEN FLUORESCENCE IMAGING (ICG) (N/A)   Total Length of Stay:  LOS: 3 days  Events:   Extubated today No events    BP (!) 109/56   Pulse 89   Temp 98.3 F (36.8 C) (Oral)   Resp (!) 21   Ht 5\' 3"  (1.6 m)   Wt 67.1 kg   SpO2 96%   BMI 26.20 kg/m   PAP: (20-41)/(9-26) 38/9 CO:  [3.1 L/min-4.2 L/min] 4 L/min CI:  [1.8 L/min/m2-2.5 L/min/m2] 2.4 L/min/m2  Vent Mode: PSV;CPAP FiO2 (%):  [40 %] 40 % Set Rate:  [4 bmp-14 bmp] 4 bmp Vt Set:  [410 mL-510 mL] 410 mL PEEP:  [5 cmH20] 5 cmH20 Pressure Support:  [10 cmH20] 10 cmH20 Plateau Pressure:  [8 cmH20-17 cmH20] 8 cmH20  . sodium chloride Stopped (04/28/20 1236)  . sodium chloride    . sodium chloride Stopped (04/28/20 0034)  . cefUROXime (ZINACEF)  IV 1.5 g (04/28/20 1626)  . dexmedetomidine (PRECEDEX) IV infusion Stopped (04/28/20 0928)  . epinephrine Stopped (04/28/20 1019)  . lactated ringers    . lactated ringers    . phenylephrine (NEO-SYNEPHRINE) Adult infusion Stopped (04/28/20 1445)    I/O last 3 completed shifts: In: 5978.4 [I.V.:3429.5; Blood:330; NG/GT:120; IV Piggyback:2098.9] Out: 3165 [RNHAF:7903; Emesis/NG output:45; Blood:700; Chest Tube:500]   CBC Latest Ref Rng & Units 04/28/2020 04/28/2020 04/28/2020  WBC 4.0 - 10.5 K/uL 15.9(H) - -  Hemoglobin 12.0 - 15.0 g/dL 10.1(L) 9.5(L) 8.8(L)  Hematocrit 36 - 46 % 32.5(L) 28.0(L) 26.0(L)  Platelets 150 - 400 K/uL 193 - -    BMP Latest Ref Rng & Units 04/28/2020 04/28/2020 04/28/2020  Glucose 70 - 99 mg/dL 149(H) - -  BUN 8 - 23  mg/dL 11 - -  Creatinine 0.44 - 1.00 mg/dL 0.59 - -  BUN/Creat Ratio 12 - 28 - - -  Sodium 135 - 145 mmol/L 138 141 143  Potassium 3.5 - 5.1 mmol/L 4.0 3.6 3.5  Chloride 98 - 111 mmol/L 107 - -  CO2 22 - 32 mmol/L 21(L) - -  Calcium 8.9 - 10.3 mg/dL 8.2(L) - -    ABG    Component Value Date/Time   PHART 7.375 04/28/2020 1013   PCO2ART 39.1 04/28/2020 1013   PO2ART 154 (H) 04/28/2020 1013   HCO3 22.9 04/28/2020 1013   TCO2 24 04/28/2020 1013   ACIDBASEDEF 2.0 04/28/2020 1013   O2SAT 99.0 04/28/2020 [place], MD 04/28/2020 5:04 PM

## 2020-04-28 NOTE — Progress Notes (Signed)
1 Day Post-Op Procedure(s) (LRB): CORONARY ARTERY BYPASS GRAFTING (CABG) TIMES FIVE USING LEFT INTERNAL MAMMARY ARTERY, LEFT HARVESTED RADIAL ARTERY, RIGHT GREATER SAPHENOUS VEIN HARVESTED ENDOSCOPICALLY. (N/A) LEFT RADIAL ARTERY HARVEST (Left) TRANSESOPHAGEAL ECHOCARDIOGRAM (TEE) (N/A) INDOCYANINE GREEN FLUORESCENCE IMAGING (ICG) (N/A) Subjective: intubated  Objective: Vital signs in last 24 hours: Temp:  [95.5 F (35.3 C)-97.9 F (36.6 C)] 97.7 F (36.5 C) (11/11 0825) Pulse Rate:  [67-92] 88 (11/11 0825) Cardiac Rhythm: Atrial paced (11/11 0400) Resp:  [9-26] 23 (11/11 0825) BP: (76-152)/(51-73) 97/58 (11/11 0700) SpO2:  [98 %-100 %] 100 % (11/11 0825) Arterial Line BP: (96-140)/(42-66) 115/55 (11/11 0700) FiO2 (%):  [40 %-50 %] 40 % (11/11 0825)  Hemodynamic parameters for last 24 hours: PAP: (25-41)/(11-26) 30/16 CO:  [3.1 L/min-3.4 L/min] 3.4 L/min CI:  [1.8 L/min/m2-2 L/min/m2] 2 L/min/m2  Intake/Output from previous day: 11/10 0701 - 11/11 0700 In: 5898.7 [I.V.:3349.8; Blood:330; NG/GT:120; IV Piggyback:2098.9] Out: 2965 [Urine:1720; Emesis/NG output:45; Blood:700; Chest Tube:500] Intake/Output this shift: Total I/O In: -  Out: 120 [Urine:60; Chest Tube:60]  General appearance: alert and cooperative Neurologic: intact Heart: regular rate and rhythm, S1, S2 normal, no murmur, click, rub or gallop Lungs: clear to auscultation bilaterally Abdomen: soft, non-tender; bowel sounds normal; no masses,  no organomegaly Extremities: extremities normal, atraumatic, no cyanosis or edema Wound: dressed  Lab Results: Recent Labs    04/27/20 2109 04/27/20 2152 04/28/20 0123 04/28/20 0300  WBC 16.6*  --   --  15.5*  HGB 11.0*   < > 9.5* 10.5*  HCT 34.9*   < > 28.0* 33.4*  PLT 217  --   --  243   < > = values in this interval not displayed.   BMET:  Recent Labs    04/27/20 2109 04/27/20 2152 04/28/20 0123 04/28/20 0300  NA 139   < > 144 141  K 4.6   < > 3.7 4.1   CL 112*  --   --  111  CO2 21*  --   --  24  GLUCOSE 205*  --   --  157*  BUN 10  --   --  9  CREATININE 0.64  --   --  0.60  CALCIUM 7.5*  --   --  7.7*   < > = values in this interval not displayed.    PT/INR:  Recent Labs    04/27/20 1508  LABPROT 17.0*  INR 1.4*   ABG    Component Value Date/Time   PHART 7.310 (L) 04/28/2020 0123   HCO3 21.4 04/28/2020 0123   TCO2 23 04/28/2020 0123   ACIDBASEDEF 5.0 (H) 04/28/2020 0123   O2SAT 99.0 04/28/2020 0123   CBG (last 3)  Recent Labs    04/28/20 0612 04/28/20 0701 04/28/20 0803  GLUCAP 120* 147* 160*    Assessment/Plan: S/P Procedure(s) (LRB): CORONARY ARTERY BYPASS GRAFTING (CABG) TIMES FIVE USING LEFT INTERNAL MAMMARY ARTERY, LEFT HARVESTED RADIAL ARTERY, RIGHT GREATER SAPHENOUS VEIN HARVESTED ENDOSCOPICALLY. (N/A) LEFT RADIAL ARTERY HARVEST (Left) TRANSESOPHAGEAL ECHOCARDIOGRAM (TEE) (N/A) INDOCYANINE GREEN FLUORESCENCE IMAGING (ICG) (N/A) extubate  Transition off NTG to isosorbide Gentle resuscitation   LOS: 3 days    [name] 04/28/2020

## 2020-04-28 NOTE — Progress Notes (Signed)
Called Gerhardt due to ABG results, pt remains acidotic. MD ordered 1 amp bicarb, recheck ABG, and attempt to wean until the morning.

## 2020-04-28 NOTE — Progress Notes (Signed)
NAME:  Katrina Castro, MRN:  [id number], DOB:  Feb 17, 1946, LOS: 3 ADMISSION DATE:  04/25/2020, CONSULTATION DATE:  04/28/2020 REFERRING MD: Rosaria Ferries, CHIEF COMPLAINT: Hypoxia post bypass  HPI/course in hospital  74 year old woman who underwent elective aortocoronary bypass for coronary artery disease.  She presented with high risk unstable angina on 11/8 and underwent coronary angiography which revealed multivessel coronary disease amenable to surgical intervention.  Underwent five-vessel aortocoronary bypass yesterday with LIMA and radial artery graft mild RV dysfunction when separating from cardiopulmonary bypass requiring initiation of epinephrine.  Extubation overnight held due to metabolic acidosis.  Past Medical History   Past Medical History:  Diagnosis Date  . Arthritis   . Concussion    2015  . Depression   . Diabetes ([place])    type 2  . Dysphagia   . Dysrhythmia    afib  . GERD (gastroesophageal reflux disease)   . Headache   . History of bronchitis   . Hypertension   . Seasonal allergies   . Toenail fungus      Past Surgical History:  Procedure Laterality Date  . APPENDECTOMY    . BACK SURGERY     x2  . BOTOX INJECTION N/A 06/06/2016   Procedure: BOTOX INJECTION;  Surgeon: Rogene Houston, MD;  [facility];  Service: Endoscopy;  Laterality: N/A;  . CHOLECYSTECTOMY    . COLONOSCOPY N/A 11/05/2012   Procedure: COLONOSCOPY;  Surgeon: Rogene Houston, MD;  [facility];  Service: Endoscopy;  Laterality: N/A;  1030  . COLONOSCOPY WITH PROPOFOL N/A 05/01/2019   Procedure: COLONOSCOPY WITH PROPOFOL;  Surgeon: Rogene Houston, MD;  [facility];  Service: Endoscopy;  Laterality: N/A;  11:20am  . ESOPHAGEAL DILATION N/A 07/06/2015   Procedure: ESOPHAGEAL DILATION;  Surgeon: Rogene Houston, MD;  [facility];  Service: Endoscopy;  Laterality: N/A;  . ESOPHAGEAL DILATION N/A 06/06/2016   Procedure: ESOPHAGEAL DILATION;   Surgeon: Rogene Houston, MD;  [facility];  Service: Endoscopy;  Laterality: N/A;  . ESOPHAGOGASTRODUODENOSCOPY N/A 02/12/2014   Procedure: ESOPHAGOGASTRODUODENOSCOPY (EGD);  Surgeon: Rogene Houston, MD;  [facility];  Service: Endoscopy;  Laterality: N/A;  150  . ESOPHAGOGASTRODUODENOSCOPY N/A 07/06/2015   Procedure: ESOPHAGOGASTRODUODENOSCOPY (EGD);  Surgeon: Rogene Houston, MD;  [facility];  Service: Endoscopy;  Laterality: N/A;  1:25 - moved to 1/18 @ 10:30 - Ann to notify pt  . ESOPHAGOGASTRODUODENOSCOPY (EGD) WITH ESOPHAGEAL DILATION N/A 07/25/2012   Procedure: ESOPHAGOGASTRODUODENOSCOPY (EGD) WITH ESOPHAGEAL DILATION;  Surgeon: Rogene Houston, MD;  [facility];  Service: Endoscopy;  Laterality: N/A;  325-rescheduled to [place] notified pt  . ESOPHAGOGASTRODUODENOSCOPY (EGD) WITH PROPOFOL N/A 06/06/2016   Procedure: ESOPHAGOGASTRODUODENOSCOPY (EGD) WITH PROPOFOL;  Surgeon: Rogene Houston, MD;  [facility];  Service: Endoscopy;  Laterality: N/A;  . EYE SURGERY     cataracts removed  . Foot surgeries Bilateral    hammer toes  . LEFT HEART CATH AND CORONARY ANGIOGRAPHY N/A 04/26/2020   Procedure: LEFT HEART CATH AND CORONARY ANGIOGRAPHY;  Surgeon: Martinique, Peter M, MD;  Location: [facility];  Service: Cardiovascular;  Laterality: N/A;  . Venia Minks DILATION N/A 02/12/2014   Procedure: Venia Minks DILATION;  Surgeon: Rogene Houston, MD;  [facility];  Service: Endoscopy;  Laterality: N/A;  . POLYPECTOMY  05/01/2019   Procedure: POLYPECTOMY;  Surgeon: Rogene Houston, MD;  [facility];  Service: Endoscopy;;  colon   . Right knee arthroscopy     x2  . TONSILLECTOMY    . TOTAL ABDOMINAL HYSTERECTOMY    . TOTAL KNEE ARTHROPLASTY Right 04/03/2017   Procedure: RIGHT TOTAL KNEE ARTHROPLASTY;  Surgeon: Latanya Maudlin, MD;  Location: [facility];  Service: Orthopedics;  Laterality: Right;      Interim  history/subjective:  This morning, patient is awake following commands acknowledges chest pain on deep inspiration.  Objective   Blood pressure (!) 89/50, pulse 87, temperature 98.4 F (36.9 C), resp. rate 18, height 5\' 3"  (1.6 m), weight 67.1 kg, SpO2 93 %. PAP: (20-41)/(9-26) 21/13 CO:  [3.1 L/min-4.2 L/min] 4 L/min CI:  [1.8 L/min/m2-2.5 L/min/m2] 2.3 L/min/m2  Vent Mode: PSV;CPAP FiO2 (%):  [40 %-50 %] 40 % Set Rate:  [4 bmp-14 bmp] 4 bmp Vt Set:  [410 mL-510 mL] 410 mL PEEP:  [5 cmH20] 5 cmH20 Pressure Support:  [10 cmH20] 10 cmH20 Plateau Pressure:  [8 cmH20-17 cmH20] 8 cmH20   Intake/Output Summary (Last 24 hours) at 04/28/2020 1111 Last data filed at 04/28/2020 1000 Gross per 24 hour  Intake 4659.08 ml  Output 2950 ml  Net 1709.08 ml   Filed Weights   04/25/20 2053 04/27/20 0330  Weight: 67.2 kg 67.1 kg    Examination: Physical Exam Constitutional:      General: She is in acute distress.     Interventions: She is intubated.  Eyes:     Pupils: Pupils are equal, round, and reactive to light.  Neck:     Comments: 7.5 ET tube in place.  OG tube in place Cardiovascular:     Rate and Rhythm: Normal rate and regular rhythm.     Heart sounds: S1 normal and S2 normal. Friction rub present.  Pulmonary:     Effort: She is intubated.     Breath sounds: Normal breath sounds.  Chest:     Comments: Midline sternotomy incision.  Minimal chest tube drainage. Abdominal:     General: Abdomen is flat.     Palpations: Abdomen is soft.  Genitourinary:    Comments: Clear urine in Foley catheter Musculoskeletal:     Right lower leg: No edema.     Left lower leg: No edema.  Skin:    General: Skin is warm.  Neurological:     General: No focal deficit present.     Mental Status: She is alert.     Comments: No focal neurological deficits      Ancillary tests (personally reviewed)  CBC: Recent Labs  Lab 04/26/20 0250 04/26/20 0250 04/27/20 0409 04/27/20 0920  04/27/20 1225 04/27/20 1240 04/27/20 1508 04/27/20 1823 04/27/20 2109 04/27/20 2152 04/27/20 2302 04/28/20 0123 04/28/20 0300  WBC 9.7  --  6.7  --   --   --  14.8*  --  16.6*  --   --   --  15.5*  HGB 12.0   < > 11.3*   < > 7.8*   < > 11.1*   < > 11.0* 10.9* 11.2* 9.5* 10.5*  HCT 39.7   < > 36.5   < > 24.2*   < > 35.9*   < > 34.9* 32.0* 33.0* 28.0* 33.4*  MCV 83.9  --  84.1  --   --   --  84.9  --  85.5  --   --   --  84.8  PLT 199   < > 281  --  210  --  191  --  217  --   --   --  243   < > = values in this interval not displayed.    Basic Metabolic Panel: Recent Labs  Lab 04/25/20 2058 04/25/20 2058 04/26/20 0145 04/26/20 0145 04/27/20 0409 04/27/20 0920 04/27/20 1240 04/27/20 1240 04/27/20 1304 04/27/20 1337 04/27/20 1341 04/27/20 1458 04/27/20 2109 04/27/20 2152 04/27/20 2302 04/28/20 0123 04/28/20 0300  NA 138   < > 141   < > 140   < > 137   < > 138   < > 141   < > 139 142 142 144 141  K 3.7   < > 3.6   < > 3.8   < > 5.2*   < > 4.9   < > 4.4   < > 4.6 3.9 3.9 3.7 4.1  CL 104   < > 108   < > 108   < > 102  --  103  --  104  --  112*  --   --   --  111  CO2 24  --  23  --  23  --   --   --   --   --   --   --  21*  --   --   --  24  GLUCOSE 161*   < > 134*   < > 156*   < > 160*  --  190*  --  206*  --  205*  --   --   --  157*  BUN 10   < > 11   < > 15   < > 11  --  12  --  13  --  10  --   --   --  9  CREATININE 0.82   < > 0.74   < > 0.69   < > 0.30*  --  0.40*  --  0.40*  --  0.64  --   --   --  0.60  CALCIUM 9.7  --  9.7  --  9.5  --   --   --   --   --   --   --  7.5*  --   --   --  7.7*  MG  --   --   --   --   --   --   --   --   --   --   --   --  2.9*  --   --   --  2.6*   < > = values in this interval not displayed.   GFR: Estimated Creatinine Clearance: 57.6 mL/min (by C-G formula based on SCr of 0.6 mg/dL). Recent Labs  Lab 04/27/20 0409 04/27/20 1508 04/27/20 2109 04/28/20 0300  WBC 6.7 14.8* 16.6* 15.5*    Liver Function Tests: No results  for input(s): AST, ALT, ALKPHOS, BILITOT, PROT, ALBUMIN in the last 168 hours. No results for input(s): LIPASE, AMYLASE in the last 168 hours. No results for input(s): AMMONIA in the last 168 hours.  ABG    Component Value Date/Time   PHART 7.310 (L) 04/28/2020 0123   PCO2ART 42.5 04/28/2020 0123   PO2ART 170 (H) 04/28/2020 0123   HCO3 21.4 04/28/2020 0123   TCO2 23 04/28/2020 0123   ACIDBASEDEF 5.0 (H) 04/28/2020 0123   O2SAT 99.0 04/28/2020 0123     Coagulation Profile: Recent Labs  Lab 04/25/20 2341 04/26/20 0250 04/27/20 1508  INR 1.1  1.2 1.4*    Cardiac Enzymes: No results for input(s): CKTOTAL, CKMB, CKMBINDEX, TROPONINI in the last 168 hours.  HbA1C: Hgb A1c MFr Bld  Date/Time Value Ref Range Status  04/25/2020 11:41 PM 7.5 (H) 4.8 - 5.6 % Final    Comment:    (NOTE) Pre diabetes:          5.7%-6.4%  Diabetes:              >6.4%  Glycemic control for   <7.0% adults with diabetes   04/25/2020 09:40 PM 7.5 (H) 4.8 - 5.6 % Final    Comment:    (NOTE) Pre diabetes:          5.7%-6.4%  Diabetes:              >6.4%  Glycemic control for   <7.0% adults with diabetes     CBG: Recent Labs  Lab 04/28/20 0701 04/28/20 0803 04/28/20 0919 04/28/20 1011 04/28/20 1100  GLUCAP 147* 160* 143* 128* 106*   Chest x-ray 11/11 (personally reviewed): Clear lung fields bilaterally chest tubes and ET tube in place.   Assessment & Plan:  Critically ill due to acute hypoxic respiratory failure require mechanical ventilation following bypass surgery Critically ill due RV dysfunction requiring titration of epinephrine Coronary artery disease status post CABG x5 Hypertension Type 2 diabetes  Plan:  -ABG has normalized and patient has passed SBT.  Will extubate. -Once extubated, wean epinephrine to off to maintain normal cardiac output and remove PA and arterial catheters. -Commence mobilization -Continue perioperative pain control, encourage oral  analgesics. -Once off pressors consider initiating diuresis likely tomorrow.  CRITICAL CARE Performed by: Kipp Brood   Total critical care time: 40 minutes  Critical care time was exclusive of separately billable procedures and treating other patients.  Critical care was necessary to treat or prevent imminent or life-threatening deterioration.  Critical care was time spent personally by me on the following activities: development of treatment plan with patient and/or surrogate as well as nursing, discussions with consultants, evaluation of patient's response to treatment, examination of patient, obtaining history from patient or surrogate, ordering and performing treatments and interventions, ordering and review of laboratory studies, ordering and review of radiographic studies, pulse oximetry, re-evaluation of patient's condition and participation in multidisciplinary rounds.  Kipp Brood, MD [facility] ICU Physician [place]  Pager: [phone] Mobile: [phone] After hours: [phone].    04/28/2020, 11:11 AM

## 2020-04-28 NOTE — Procedures (Signed)
Extubation Procedure Note  Patient Details:   Name: Katrina Castro DOB: 1946-06-11 MRN: [id number]   Airway Documentation:    Vent end date: 04/28/20 Vent end time: 0919   Evaluation  O2 sats: stable throughout Complications: No apparent complications Patient did tolerate procedure well. Bilateral Breath Sounds: Clear, Diminished   Pt extubated to 4L [place] per rapid wean protocol. Dr Coy Saunas, CCM did not want NIF and VC. Dr. Coy Saunas notified that patient did not have cuff leak. No stridor noted. Pt able to voice her name.  [name] 04/28/2020, 9:20 AM

## 2020-04-28 NOTE — Progress Notes (Signed)
At this time unable to start PIV on the patient due to the amount of swelling and tightness to the right arm. Recommending the nurse to run all medications through the Central line at this time. The patients Left arm is restricted.

## 2020-04-29 ENCOUNTER — Inpatient Hospital Stay (HOSPITAL_COMMUNITY): Payer: Medicare Other

## 2020-04-29 DIAGNOSIS — Z951 Presence of aortocoronary bypass graft: Secondary | ICD-10-CM

## 2020-04-29 DIAGNOSIS — I200 Unstable angina: Secondary | ICD-10-CM | POA: Diagnosis not present

## 2020-04-29 DIAGNOSIS — G4700 Insomnia, unspecified: Secondary | ICD-10-CM | POA: Diagnosis not present

## 2020-04-29 LAB — BASIC METABOLIC PANEL
Anion gap: 9 (ref 5–15)
BUN: 13 mg/dL (ref 8–23)
CO2: 21 mmol/L — ABNORMAL LOW (ref 22–32)
Calcium: 8.4 mg/dL — ABNORMAL LOW (ref 8.9–10.3)
Chloride: 108 mmol/L (ref 98–111)
Creatinine, Ser: 0.63 mg/dL (ref 0.44–1.00)
GFR, Estimated: >60 mL/min (ref >60)
Glucose, Bld: 154 mg/dL — ABNORMAL HIGH (ref 70–99)
Potassium: 3.9 mmol/L (ref 3.5–5.1)
Sodium: 138 mmol/L (ref 135–145)

## 2020-04-29 LAB — CBC
HCT: 32.4 % — ABNORMAL LOW (ref 36.0–46.0)
Hemoglobin: 10.2 g/dL — ABNORMAL LOW (ref 12.0–15.0)
MCH: 27.3 pg (ref 26.0–34.0)
MCHC: 31.5 g/dL (ref 30.0–36.0)
MCV: 86.6 fL (ref 80.0–100.0)
Platelets: 241 10*3/uL (ref 150–400)
RBC: 3.74 MIL/uL — ABNORMAL LOW (ref 3.87–5.11)
RDW: 15.3 % (ref 11.5–15.5)
WBC: 20.4 10*3/uL — ABNORMAL HIGH (ref 4.0–10.5)
nRBC: 0 % (ref 0.0–0.2)

## 2020-04-29 LAB — GLUCOSE, CAPILLARY
Glucose-Capillary: 152 mg/dL — ABNORMAL HIGH (ref 70–99)
Glucose-Capillary: 225 mg/dL — ABNORMAL HIGH (ref 70–99)
Glucose-Capillary: 175 mg/dL — ABNORMAL HIGH (ref 70–99)

## 2020-04-29 MED ORDER — KETOROLAC TROMETHAMINE 15 MG/ML IJ SOLN
7.5000 mg | Freq: Four times a day (QID) | INTRAMUSCULAR | Status: DC
  Administered 2020-04-29 – 2020-04-30 (×4): 7.5 mg via INTRAVENOUS
  Filled 2020-04-29 (×4): qty 1

## 2020-04-29 MED ORDER — SODIUM CHLORIDE 0.9% FLUSH: 10.0000 mL | INTRAVENOUS | Status: DC | PRN

## 2020-04-29 MED ORDER — SODIUM CHLORIDE 0.9% FLUSH
10.0000 mL | Freq: Two times a day (BID) | INTRAVENOUS | Status: DC
  Administered 2020-04-29 – 2020-04-30 (×3): 10 mL

## 2020-04-29 MED ORDER — CLOPIDOGREL BISULFATE 75 MG PO TABS
75.0000 mg | ORAL_TABLET | Freq: Every day | ORAL | Status: DC
  Administered 2020-04-29 – 2020-05-10 (×12): 75 mg via ORAL
  Filled 2020-04-29 (×12): qty 1

## 2020-04-29 MED ORDER — LIP MEDEX EX OINT
TOPICAL_OINTMENT | CUTANEOUS | Status: DC | PRN
  Filled 2020-04-29 (×2): qty 7

## 2020-04-29 MED ORDER — FUROSEMIDE 10 MG/ML IJ SOLN
40.0000 mg | Freq: Two times a day (BID) | INTRAMUSCULAR | Status: DC
  Administered 2020-04-29 – 2020-04-30 (×3): 40 mg via INTRAVENOUS
  Filled 2020-04-29 (×3): qty 4

## 2020-04-29 MED ORDER — ASPIRIN EC 81 MG PO TBEC
81.0000 mg | DELAYED_RELEASE_TABLET | Freq: Every day | ORAL | Status: DC
  Administered 2020-04-29 – 2020-05-10 (×12): 81 mg via ORAL
  Filled 2020-04-29 (×12): qty 1

## 2020-04-29 MED ORDER — INSULIN ASPART 100 UNIT/ML ~~LOC~~ SOLN
0.0000 [IU] | Freq: Three times a day (TID) | SUBCUTANEOUS | Status: DC
  Administered 2020-04-29: 2 [IU] via SUBCUTANEOUS
  Administered 2020-04-29: 4 [IU] via SUBCUTANEOUS
  Administered 2020-04-29: 8 [IU] via SUBCUTANEOUS
  Administered 2020-04-30 – 2020-05-01 (×4): 2 [IU] via SUBCUTANEOUS
  Administered 2020-05-01: 4 [IU] via SUBCUTANEOUS
  Administered 2020-05-03 – 2020-05-05 (×4): 2 [IU] via SUBCUTANEOUS
  Administered 2020-05-05: 4 [IU] via SUBCUTANEOUS
  Administered 2020-05-06 (×2): 2 [IU] via SUBCUTANEOUS
  Administered 2020-05-07: 4 [IU] via SUBCUTANEOUS
  Administered 2020-05-07: 2 [IU] via SUBCUTANEOUS
  Administered 2020-05-08: 4 [IU] via SUBCUTANEOUS
  Administered 2020-05-08: 8 [IU] via SUBCUTANEOUS
  Administered 2020-05-09 – 2020-05-10 (×3): 2 [IU] via SUBCUTANEOUS

## 2020-04-29 MED ORDER — ZOLPIDEM TARTRATE 5 MG PO TABS
5.0000 mg | ORAL_TABLET | Freq: Every evening | ORAL | Status: DC | PRN
  Administered 2020-04-30: 5 mg via ORAL
  Filled 2020-04-29: qty 1

## 2020-04-29 MED FILL — Magnesium Sulfate Inj 50%: INTRAMUSCULAR | Qty: 10 | Status: AC

## 2020-04-29 MED FILL — Heparin Sodium (Porcine) Inj 1000 Unit/ML: INTRAMUSCULAR | Qty: 30 | Status: AC

## 2020-04-29 MED FILL — Heparin Sodium (Porcine) Inj 1000 Unit/ML: INTRAMUSCULAR | Qty: 10 | Status: AC

## 2020-04-29 MED FILL — Potassium Chloride Inj 2 mEq/ML: INTRAVENOUS | Qty: 40 | Status: AC

## 2020-04-29 MED FILL — Sodium Bicarbonate IV Soln 8.4%: INTRAVENOUS | Qty: 50 | Status: AC

## 2020-04-29 MED FILL — Sodium Chloride IV Soln 0.9%: INTRAVENOUS | Qty: 2000 | Status: AC

## 2020-04-29 MED FILL — Mannitol IV Soln 20%: INTRAVENOUS | Qty: 500 | Status: AC

## 2020-04-29 MED FILL — Electrolyte-R (PH 7.4) Solution: INTRAVENOUS | Qty: 5000 | Status: AC

## 2020-04-29 NOTE — Progress Notes (Signed)
Ms. Ostenson daughter, [name], advised that she would like to speak with a Education officer, museum about options for Ms. Tomaszewski after discharge.  Tammy thinks that she might need help with Ms. Doss at home or possibly rehab.  Voicemail left for [employer] to that affect.

## 2020-04-29 NOTE — Progress Notes (Signed)
2 Days Post-Op Procedure(s) (LRB): CORONARY ARTERY BYPASS GRAFTING (CABG) TIMES FIVE USING LEFT INTERNAL MAMMARY ARTERY, LEFT HARVESTED RADIAL ARTERY, RIGHT GREATER SAPHENOUS VEIN HARVESTED ENDOSCOPICALLY. (N/A) LEFT RADIAL ARTERY HARVEST (Left) TRANSESOPHAGEAL ECHOCARDIOGRAM (TEE) (N/A) INDOCYANINE GREEN FLUORESCENCE IMAGING (ICG) (N/A) Subjective: No complaints  Objective: Vital signs in last 24 hours: Temp:  [97.2 F (36.2 C)-99.1 F (37.3 C)] 97.2 F (36.2 C) (11/12 0409) Pulse Rate:  [80-105] 105 (11/12 0700) Cardiac Rhythm: Normal sinus rhythm;Atrial paced (11/11 2000) Resp:  [0-31] 16 (11/12 0700) BP: (89-144)/(46-125) 131/83 (11/12 0700) SpO2:  [72 %-100 %] 92 % (11/12 0700) Arterial Line BP: (104-129)/(44-57) 111/45 (11/11 1230) FiO2 (%):  [40 %] 40 % (11/11 0825) Weight:  [74.1 kg] 74.1 kg (11/12 0409)  Hemodynamic parameters for last 24 hours: PAP: (20-40)/(9-19) 38/9 CO:  [4 L/min-4.2 L/min] 4 L/min CI:  [2.3 L/min/m2-2.5 L/min/m2] 2.4 L/min/m2  Intake/Output from previous day: 11/11 0701 - 11/12 0700 In: 1319 [I.V.:882; NG/GT:60; IV Piggyback:377] Out: 1502 [Urine:642; Chest Tube:860] Intake/Output this shift: No intake/output data recorded.  General appearance: alert and cooperative Neurologic: intact Heart: regular rate and rhythm, S1, S2 normal, no murmur, click, rub or gallop Lungs: clear to auscultation bilaterally Abdomen: soft, non-tender; bowel sounds normal; no masses,  no organomegaly Extremities: edema mild Wound: c/d/i  Lab Results: Recent Labs    04/28/20 1614 04/29/20 0436  WBC 15.9* 20.4*  HGB 10.1* 10.2*  HCT 32.5* 32.4*  PLT 193 241   BMET:  Recent Labs    04/28/20 1614 04/29/20 0436  NA 138 138  K 4.0 3.9  CL 107 108  CO2 21* 21*  GLUCOSE 149* 154*  BUN 11 13  CREATININE 0.59 0.63  CALCIUM 8.2* 8.4*    PT/INR:  Recent Labs    04/27/20 1508  LABPROT 17.0*  INR 1.4*   ABG    Component Value Date/Time   PHART  7.375 04/28/2020 1013   HCO3 22.9 04/28/2020 1013   TCO2 24 04/28/2020 1013   ACIDBASEDEF 2.0 04/28/2020 1013   O2SAT 99.0 04/28/2020 1013   CBG (last 3)  Recent Labs    04/28/20 1926 04/28/20 2329 04/29/20 0717  GLUCAP 160* 161* 152*    Assessment/Plan: S/P Procedure(s) (LRB): CORONARY ARTERY BYPASS GRAFTING (CABG) TIMES FIVE USING LEFT INTERNAL MAMMARY ARTERY, LEFT HARVESTED RADIAL ARTERY, RIGHT GREATER SAPHENOUS VEIN HARVESTED ENDOSCOPICALLY. (N/A) LEFT RADIAL ARTERY HARVEST (Left) TRANSESOPHAGEAL ECHOCARDIOGRAM (TEE) (N/A) INDOCYANINE GREEN FLUORESCENCE IMAGING (ICG) (N/A) Mobilize Diuresis PT/OT   LOS: 4 days    [name] 04/29/2020

## 2020-04-29 NOTE — Hospital Course (Addendum)
History of Present Illness:      This is a 74 year old female with a past medical history of paroxsymal atrial fibrillation, type II diabetes mellitus, hypertension, esophageal dysmotility who presented to [facility] ED with complaints of chest pain on 04/25/2020. Per patient, she has chest pain mostly with exertion, it sometimes radiates to her left shoulder, she sometimes has left arm numbness. She also has shortness of breath and diaphoresis with her chest pain. She had an echo done 03/17/2020 that showed LV EF 55-60%, mild MV regurgitation, no pericardial effusion. She then on 04/22/2000 had a CCTA that showed occluded proximal LAD and ostial portion of D1.  She underwent a cardiac catheterization today which showed proximal to mid LAD 75% stenosis, first Diagonal 90% stenosis, OM1 with 95% stenosis, RPDA 90% stenosed, and first RPL with 90% stenosis. Dr. Orvan Seen has been consulted for the consideration of coronary artery bypass grafting surgery. Patient's vital signs are stable at the time of my exam. She recently had an episode of chest pain and felt anxious. She is emotional this afternoon.  Hospital Course:  On 04/27/2020 Ms. Katrina Castro underwent a Coronary Artery Bypass Grafting x 5 with Dr. Orvan Seen. She tolerated the procedure well and was transferred to the surgical ICU in stable condition. She remained intubated POD 1. She was neurologically intact. She was extubaed POD 2. We began to mobilize the patient and initiated a diuretic regimen for fluid overload. Critical care was following along with [place]. She was off pressor support and ambulating. We consulted PT/OT for mobilization.  She is maintaining normal sinus rhythm on aspirin and beta-blocker.  She has required diuresis for volume overload.  Blood sugars have been stable and she has been resumed on her preoperative Amaryl and Metformin.  She did have a leukocytosis which is being monitored.  Most recent white blood cell count on 04/30/2020 is  20.1.  Central line and Foley catheter have been removed.  She does have a mild expected acute blood loss anemia which is stable.  Renal function has remained within normal limits.  She has had some difficulty with diarrhea.  Laxatives have been discontinued and she has been started on Imodium.  A gastrointestinal PCR panel has been ordered.Her blood pressure remained more stable on the decreased dose of Isordil. We increased her metoprolol to 50mg  BID due to a few brief episodes of atrial fibrillation overnight. Her BP had been slowly rising which we continued to trend. Her epicardial pacing wires were removed. She had an episode of atrial fibrillation which she has had in the past. She was started on iv Amio. She was transitioned to PO Amio on 11/17. She has a positive result for a UTI therefore she was started on Rocephin. Her culture was pending and we continued to follow.

## 2020-04-29 NOTE — Progress Notes (Signed)
      [address]       [place] [address]             [phone]     POD # 2 CABG   BP (!) 145/59   Pulse 94   Temp 98.3 F (36.8 C) (Oral)   Resp (!) 22   Ht 5\' 3"  (1.6 m)   Wt 74.1 kg   SpO2 93%   BMI 28.94 kg/m  RA 96% sat  Intake/Output Summary (Last 24 hours) at 04/29/2020 1658 Last data filed at 04/29/2020 1600 Gross per 24 hour  Intake 859.88 ml  Output 2067 ml  Net -1207.12 ml   CBG elevated- on SSI  Doing well POD # 2  Remo Lipps C. Roxan Hockey, MD [facility] [phone]

## 2020-04-29 NOTE — Progress Notes (Signed)
NAME:  Katrina Castro, MRN:  [id number], DOB:  02/02/1946, LOS: 4 ADMISSION DATE:  04/25/2020, CONSULTATION DATE:  04/28/2020 REFERRING MD: Rosaria Ferries, CHIEF COMPLAINT: Hypoxia post bypass  HPI/course in hospital  74 year old woman who underwent elective aortocoronary bypass for coronary artery disease.  She presented with high risk unstable angina on 11/8 and underwent coronary angiography which revealed multivessel coronary disease amenable to surgical intervention.  Underwent five-vessel aortocoronary bypass yesterday with LIMA and radial artery graft mild RV dysfunction when separating from cardiopulmonary bypass requiring initiation of epinephrine.  Extubation overnight held due to metabolic acidosis.  Past Medical History   Past Medical History:  Diagnosis Date  . Arthritis   . Concussion    2015  . Depression   . Diabetes ([place])    type 2  . Dysphagia   . Dysrhythmia    afib  . GERD (gastroesophageal reflux disease)   . Headache   . History of bronchitis   . Hypertension   . Seasonal allergies   . Toenail fungus      Past Surgical History:  Procedure Laterality Date  . APPENDECTOMY    . BACK SURGERY     x2  . BOTOX INJECTION N/A 06/06/2016   Procedure: BOTOX INJECTION;  Surgeon: Rogene Houston, MD;  [facility];  Service: Endoscopy;  Laterality: N/A;  . CHOLECYSTECTOMY    . COLONOSCOPY N/A 11/05/2012   Procedure: COLONOSCOPY;  Surgeon: Rogene Houston, MD;  [facility];  Service: Endoscopy;  Laterality: N/A;  1030  . COLONOSCOPY WITH PROPOFOL N/A 05/01/2019   Procedure: COLONOSCOPY WITH PROPOFOL;  Surgeon: Rogene Houston, MD;  [facility];  Service: Endoscopy;  Laterality: N/A;  11:20am  . CORONARY ARTERY BYPASS GRAFT N/A 04/27/2020   Procedure: CORONARY ARTERY BYPASS GRAFTING (CABG) TIMES FIVE USING LEFT INTERNAL MAMMARY ARTERY, LEFT HARVESTED RADIAL ARTERY, RIGHT GREATER SAPHENOUS VEIN HARVESTED ENDOSCOPICALLY.;  Surgeon:  [name], MD;  Location: [place];  Service: Open Heart Surgery;  Laterality: N/A;  . ESOPHAGEAL DILATION N/A 07/06/2015   Procedure: ESOPHAGEAL DILATION;  Surgeon: Rogene Houston, MD;  [facility];  Service: Endoscopy;  Laterality: N/A;  . ESOPHAGEAL DILATION N/A 06/06/2016   Procedure: ESOPHAGEAL DILATION;  Surgeon: Rogene Houston, MD;  [facility];  Service: Endoscopy;  Laterality: N/A;  . ESOPHAGOGASTRODUODENOSCOPY N/A 02/12/2014   Procedure: ESOPHAGOGASTRODUODENOSCOPY (EGD);  Surgeon: Rogene Houston, MD;  [facility];  Service: Endoscopy;  Laterality: N/A;  150  . ESOPHAGOGASTRODUODENOSCOPY N/A 07/06/2015   Procedure: ESOPHAGOGASTRODUODENOSCOPY (EGD);  Surgeon: Rogene Houston, MD;  [facility];  Service: Endoscopy;  Laterality: N/A;  1:25 - moved to 1/18 @ 10:30 - Ann to notify pt  . ESOPHAGOGASTRODUODENOSCOPY (EGD) WITH ESOPHAGEAL DILATION N/A 07/25/2012   Procedure: ESOPHAGOGASTRODUODENOSCOPY (EGD) WITH ESOPHAGEAL DILATION;  Surgeon: Rogene Houston, MD;  [facility];  Service: Endoscopy;  Laterality: N/A;  325-rescheduled to [place] notified pt  . ESOPHAGOGASTRODUODENOSCOPY (EGD) WITH PROPOFOL N/A 06/06/2016   Procedure: ESOPHAGOGASTRODUODENOSCOPY (EGD) WITH PROPOFOL;  Surgeon: Rogene Houston, MD;  [facility];  Service: Endoscopy;  Laterality: N/A;  . EYE SURGERY     cataracts removed  . Foot surgeries Bilateral    hammer toes  . LEFT HEART CATH AND CORONARY ANGIOGRAPHY N/A 04/26/2020   Procedure: LEFT HEART CATH AND CORONARY ANGIOGRAPHY;  Surgeon: Martinique, Peter M, MD;  Location: [facility];  Service:  Cardiovascular;  Laterality: N/A;  . MALONEY DILATION N/A 02/12/2014   Procedure: Venia Minks DILATION;  Surgeon: Rogene Houston, MD;  [facility];  Service: Endoscopy;  Laterality: N/A;  . POLYPECTOMY  05/01/2019   Procedure: POLYPECTOMY;  Surgeon: Rogene Houston, MD;  [facility];   Service: Endoscopy;;  colon   . RADIAL ARTERY HARVEST Left 04/27/2020   Procedure: LEFT RADIAL ARTERY HARVEST;  Surgeon: [name], MD;  Location: [place];  Service: Open Heart Surgery;  Laterality: Left;  . Right knee arthroscopy     x2  . TEE WITHOUT CARDIOVERSION N/A 04/27/2020   Procedure: TRANSESOPHAGEAL ECHOCARDIOGRAM (TEE);  Surgeon: [name], MD;  Location: [place];  Service: Open Heart Surgery;  Laterality: N/A;  . TONSILLECTOMY    . TOTAL ABDOMINAL HYSTERECTOMY    . TOTAL KNEE ARTHROPLASTY Right 04/03/2017   Procedure: RIGHT TOTAL KNEE ARTHROPLASTY;  Surgeon: Latanya Maudlin, MD;  Location: [facility];  Service: Orthopedics;  Laterality: Right;      Interim history/subjective:  Denies any significant complaints at present Denies any pain or discomfort Was able to ambulate this morning Did not sleep well last night-usually requires something to help with sleep-usually uses Xanax at home  Objective   Blood pressure 130/70, pulse (!) 108, temperature 98.2 F (36.8 C), temperature source Oral, resp. rate 11, height 5\' 3"  (1.6 m), weight 74.1 kg, SpO2 93 %. PAP: (20-40)/(9-14) 38/9 CO:  [4 L/min] 4 L/min CI:  [2.3 L/min/m2-2.4 L/min/m2] 2.4 L/min/m2      Intake/Output Summary (Last 24 hours) at 04/29/2020 0852 Last data filed at 04/29/2020 0600 Gross per 24 hour  Intake 1066.36 ml  Output 1382 ml  Net -315.64 ml   Filed Weights   04/25/20 2053 04/27/20 0330 04/29/20 0409  Weight: 67.2 kg 67.1 kg 74.1 kg    Examination: Elderly lady, does not appear to be in distress Sitting in chair Decreased air movement bilaterally, no added sounds appreciated S1-S2 appreciated, dressings from sternotomy noted Abdomen is soft No edema   Chest x-ray reviewed by myself with hypoventilated lung fields, chest tubes in place.  Ancillary tests (personally reviewed)  CBC: Recent Labs  Lab 04/27/20 1508 04/27/20 1823 04/27/20 2109 04/27/20 2152 04/28/20 0300  04/28/20 0856 04/28/20 1013 04/28/20 1614 04/29/20 0436  WBC 14.8*  --  16.6*  --  15.5*  --   --  15.9* 20.4*  HGB 11.1*   < > 11.0*   < > 10.5* 8.8* 9.5* 10.1* 10.2*  HCT 35.9*   < > 34.9*   < > 33.4* 26.0* 28.0* 32.5* 32.4*  MCV 84.9  --  85.5  --  84.8  --   --  85.3 86.6  PLT 191  --  217  --  243  --   --  193 241   < > = values in this interval not displayed.    Basic Metabolic Panel: Recent Labs  Lab 04/27/20 0409 04/27/20 0920 04/27/20 1341 04/27/20 1458 04/27/20 2109 04/27/20 2152 04/28/20 0300 04/28/20 0856 04/28/20 1013 04/28/20 1614 04/29/20 0436  NA 140   < > 141   < > 139   < > 141 143 141 138 138  K 3.8   < > 4.4   < > 4.6   < > 4.1 3.5 3.6 4.0 3.9  CL 108   < > 104  --  112*  --  111  --   --  107 108  CO2 23  --   --   --  21*  --  24  --   --  21* 21*  GLUCOSE 156*   < > 206*  --  205*  --  157*  --   --  149* 154*  BUN 15   < > 13  --  10  --  9  --   --  11 13  CREATININE 0.69   < > 0.40*  --  0.64  --  0.60  --   --  0.59 0.63  CALCIUM 9.5  --   --   --  7.5*  --  7.7*  --   --  8.2* 8.4*  MG  --   --   --   --  2.9*  --  2.6*  --   --  2.6*  --    < > = values in this interval not displayed.   GFR: Estimated Creatinine Clearance: 60.4 mL/min (by C-G formula based on SCr of 0.63 mg/dL). Recent Labs  Lab 04/27/20 2109 04/28/20 0300 04/28/20 1614 04/29/20 0436  WBC 16.6* 15.5* 15.9* 20.4*    Liver Function Tests: No results for input(s): AST, ALT, ALKPHOS, BILITOT, PROT, ALBUMIN in the last 168 hours. No results for input(s): LIPASE, AMYLASE in the last 168 hours. No results for input(s): AMMONIA in the last 168 hours.  ABG    Component Value Date/Time   PHART 7.375 04/28/2020 1013   PCO2ART 39.1 04/28/2020 1013   PO2ART 154 (H) 04/28/2020 1013   HCO3 22.9 04/28/2020 1013   TCO2 24 04/28/2020 1013   ACIDBASEDEF 2.0 04/28/2020 1013   O2SAT 99.0 04/28/2020 1013     Coagulation Profile: Recent Labs  Lab 04/25/20 2341 04/26/20 0250  04/27/20 1508  INR 1.1 1.2 1.4*    Cardiac Enzymes: No results for input(s): CKTOTAL, CKMB, CKMBINDEX, TROPONINI in the last 168 hours.  HbA1C: Hgb A1c MFr Bld  Date/Time Value Ref Range Status  04/25/2020 11:41 PM 7.5 (H) 4.8 - 5.6 % Final    Comment:    (NOTE) Pre diabetes:          5.7%-6.4%  Diabetes:              >6.4%  Glycemic control for   <7.0% adults with diabetes   04/25/2020 09:40 PM 7.5 (H) 4.8 - 5.6 % Final    Comment:    (NOTE) Pre diabetes:          5.7%-6.4%  Diabetes:              >6.4%  Glycemic control for   <7.0% adults with diabetes     CBG: Recent Labs  Lab 04/28/20 1315 04/28/20 1404 04/28/20 1926 04/28/20 2329 04/29/20 0717  GLUCAP 138* 148* 160* 161* 152*   Chest x-ray 11/11 (personally reviewed): Clear lung fields bilaterally chest tubes and ET tube in place.   Assessment & Plan:  Status post bypass surgery CABG x5 Hypertension Type 2 diabetes History of biventricular dysfunction  She is off pressors Ambulating as tolerated -Encourage ambulation Pulmonary toileting-use of incentive spirometer  Chest tube remains in place, does not have significant pain or discomfort  Insomnia -Ambien 5 mg nightly as needed for insomnia    We will sign off at present Call as needed  Sherrilyn Rist, MD [facility] Pager: [phone]

## 2020-04-29 NOTE — Progress Notes (Addendum)
Inpatient Diabetes Program Recommendations  AACE/ADA: New Consensus Statement on Inpatient Glycemic Control (2015)  Target Ranges:  Prepandial:   less than 140 mg/dL      Peak postprandial:   less than 180 mg/dL (1-2 hours)      Critically ill patients:  140 - 180 mg/dL   Lab Results  Component Value Date   GLUCAP 225 (H) 04/29/2020   HGBA1C 7.5 (H) 04/25/2020    Review of Glycemic Control Results for Katrina Castro, Katrina Castro (MRN [id number]) as of 04/29/2020 13:38  Ref. Range 04/28/2020 19:26 04/28/2020 23:29 04/29/2020 07:17 04/29/2020 11:29  Glucose-Capillary Latest Ref Range: 70 - 99 mg/dL 160 (H) 161 (H) 152 (H) 225 (H)   Diabetes history: Type 2 DM Outpatient Diabetes medications: Amaryl 2 mg QAM, Metformin 500 mg BID Current orders for Inpatient glycemic control: Novolog 0-24 units TID & HS  Inpatient Diabetes Program Recommendations:    Consider adding Levemir 8 units QD. Will plan to see patient.   Addendum: Spoke with patient regarding outpatient diabetes management. Verified home medications.  Reviewed patient's current A1c of 7.5%. Explained what a A1c is and what it measures. Also reviewed goal A1c with patient, importance of good glucose control @ home, and blood sugar goals. Reviewed patho of DM, current inpatient trends, impact of glycemic control and healing post op, vascular changes and comorbidities.  Patient has a meter at home and was checking 3 times per day. Denies issues with hypoglycemia. Encouraged to continue checking at same frequency and reviewed when to contact PCP.  Admits to drinking sweet tea as outpatient. Motivated to find alternatives. Reviewed plate method, and CHO mindfulness. Will place OP Edu. Patient has no further questions at this time.   Thanks, Bronson Curb, MSN, RNC-OB Diabetes Coordinator [phone] (8a-5p)

## 2020-04-30 ENCOUNTER — Inpatient Hospital Stay (HOSPITAL_COMMUNITY): Payer: Medicare Other

## 2020-04-30 LAB — CBC
HCT: 31.7 % — ABNORMAL LOW (ref 36.0–46.0)
Hemoglobin: 9.8 g/dL — ABNORMAL LOW (ref 12.0–15.0)
MCH: 26.7 pg (ref 26.0–34.0)
MCHC: 30.9 g/dL (ref 30.0–36.0)
MCV: 86.4 fL (ref 80.0–100.0)
Platelets: 274 10*3/uL (ref 150–400)
RBC: 3.67 MIL/uL — ABNORMAL LOW (ref 3.87–5.11)
RDW: 15.5 % (ref 11.5–15.5)
WBC: 20.1 10*3/uL — ABNORMAL HIGH (ref 4.0–10.5)
nRBC: 0.1 % (ref 0.0–0.2)

## 2020-04-30 LAB — BPAM RBC
Blood Product Expiration Date: 202111292359
Blood Product Expiration Date: 202111302359
Blood Product Expiration Date: 202111302359
Blood Product Expiration Date: 202111302359
ISSUE DATE / TIME: 202111100950
ISSUE DATE / TIME: 202111100950
Unit Type and Rh: 6200
Unit Type and Rh: 6200
Unit Type and Rh: 6200
Unit Type and Rh: 6200

## 2020-04-30 LAB — TYPE AND SCREEN
ABO/RH(D): A POS
Antibody Screen: NEGATIVE
Unit division: 0
Unit division: 0
Unit division: 0
Unit division: 0

## 2020-04-30 LAB — BASIC METABOLIC PANEL
Anion gap: 7 (ref 5–15)
BUN: 21 mg/dL (ref 8–23)
CO2: 27 mmol/L (ref 22–32)
Calcium: 8.5 mg/dL — ABNORMAL LOW (ref 8.9–10.3)
Chloride: 104 mmol/L (ref 98–111)
Creatinine, Ser: 0.73 mg/dL (ref 0.44–1.00)
GFR, Estimated: >60 mL/min (ref >60)
Glucose, Bld: 108 mg/dL — ABNORMAL HIGH (ref 70–99)
Potassium: 3.4 mmol/L — ABNORMAL LOW (ref 3.5–5.1)
Sodium: 138 mmol/L (ref 135–145)

## 2020-04-30 LAB — GLUCOSE, CAPILLARY
Glucose-Capillary: 144 mg/dL — ABNORMAL HIGH (ref 70–99)
Glucose-Capillary: 103 mg/dL — ABNORMAL HIGH (ref 70–99)
Glucose-Capillary: 120 mg/dL — ABNORMAL HIGH (ref 70–99)
Glucose-Capillary: 133 mg/dL — ABNORMAL HIGH (ref 70–99)
Glucose-Capillary: 121 mg/dL — ABNORMAL HIGH (ref 70–99)
Glucose-Capillary: 101 mg/dL — ABNORMAL HIGH (ref 70–99)

## 2020-04-30 MED ORDER — GLIMEPIRIDE 2 MG PO TABS
2.0000 mg | ORAL_TABLET | Freq: Every day | ORAL | Status: DC
  Administered 2020-05-02 – 2020-05-10 (×9): 2 mg via ORAL
  Filled 2020-04-30 (×11): qty 1

## 2020-04-30 MED ORDER — ~~LOC~~ CARDIAC SURGERY, PATIENT & FAMILY EDUCATION: Freq: Once | Status: AC

## 2020-04-30 MED ORDER — ALUM & MAG HYDROXIDE-SIMETH 200-200-20 MG/5ML PO SUSP
15.0000 mL | Freq: Four times a day (QID) | ORAL | Status: DC | PRN
  Administered 2020-05-03 – 2020-05-06 (×3): 15 mL via ORAL
  Filled 2020-04-30 (×3): qty 30

## 2020-04-30 MED ORDER — FUROSEMIDE 40 MG PO TABS
40.0000 mg | ORAL_TABLET | Freq: Two times a day (BID) | ORAL | Status: DC
  Administered 2020-04-30 – 2020-05-01 (×2): 40 mg via ORAL
  Filled 2020-04-30 (×2): qty 1

## 2020-04-30 MED ORDER — POTASSIUM CHLORIDE 10 MEQ/50ML IV SOLN
10.0000 meq | INTRAVENOUS | Status: AC
  Administered 2020-04-30 (×3): 10 meq via INTRAVENOUS
  Filled 2020-04-30 (×3): qty 50

## 2020-04-30 MED ORDER — METFORMIN HCL 500 MG PO TABS
500.0000 mg | ORAL_TABLET | Freq: Two times a day (BID) | ORAL | Status: DC
  Administered 2020-04-30 – 2020-05-05 (×10): 500 mg via ORAL
  Filled 2020-04-30 (×10): qty 1

## 2020-04-30 MED ORDER — SODIUM CHLORIDE 0.9% FLUSH
3.0000 mL | INTRAVENOUS | Status: DC | PRN
  Administered 2020-05-04: 3 mL via INTRAVENOUS

## 2020-04-30 MED ORDER — METOPROLOL TARTRATE 25 MG PO TABS
25.0000 mg | ORAL_TABLET | Freq: Two times a day (BID) | ORAL | Status: DC
  Administered 2020-04-30 – 2020-05-02 (×6): 25 mg via ORAL
  Filled 2020-04-30 (×6): qty 1

## 2020-04-30 MED ORDER — POTASSIUM CHLORIDE CRYS ER 20 MEQ PO TBCR
20.0000 meq | EXTENDED_RELEASE_TABLET | Freq: Two times a day (BID) | ORAL | Status: DC
  Administered 2020-04-30 – 2020-05-01 (×3): 20 meq via ORAL
  Filled 2020-04-30 (×3): qty 1

## 2020-04-30 MED ORDER — OXYBUTYNIN CHLORIDE ER 10 MG PO TB24
10.0000 mg | ORAL_TABLET | Freq: Every day | ORAL | Status: DC | PRN
  Administered 2020-04-30: 10 mg via ORAL
  Filled 2020-04-30 (×3): qty 1

## 2020-04-30 MED ORDER — SODIUM CHLORIDE 0.9 % IV SOLN: 250.0000 mL | INTRAVENOUS | Status: DC | PRN

## 2020-04-30 MED ORDER — SODIUM CHLORIDE 0.9% FLUSH
3.0000 mL | Freq: Two times a day (BID) | INTRAVENOUS | Status: DC
  Administered 2020-04-30 – 2020-05-10 (×19): 3 mL via INTRAVENOUS

## 2020-04-30 NOTE — Evaluation (Signed)
Physical Therapy Evaluation Patient Details Name: Katrina Castro MRN: [id number] DOB: 01-Sep-1945 Today's Date: 04/30/2020   History of Present Illness  Pt is a 74 y.o. female admitted and s/p CABG x5 on 04/27/20 due to recent unstable angina with cath findings of multivessel disease. PMH includes DM, depression, arthritis, HTN, R TKA (2018).    Clinical Impression  Pt presents with an overall decrease in functional mobility secondary to above. PTA, pt independent, working, and lives alone; daughter plans to stay with pt at d/c to provide assist. Educ on sternal precautions, positioning, therex, and importance of mobility. Today, pt able to initiate OOB mobility with RW; limited by dizziness/pallor/sweating with standing activity (see BP values below; RN aware). Expect pt to progress well with mobility. Pt would benefit from continued acute PT services to maximize functional mobility and independence prior to d/c with HHPT services.  HR 79, SpO2 95% on RA  Supine BP 169/88 Post-transfer BP 102/65 Seated w/ legs recliner BP 104/60     Follow Up Recommendations Home health PT;Supervision for mobility/OOB    Equipment Recommendations  Rolling walker with 5" wheels;3in1 (PT) (TBD- rolling walker vs. rollator)    Recommendations for Other Services       Precautions / Restrictions Precautions Precautions: Sternal;Fall;Other (comment) Precaution Comments: Pacing wires, sternal wound vac      Mobility  Bed Mobility Overal bed mobility: Needs Assistance Bed Mobility: Supine to Sit     Supine to sit: Supervision     General bed mobility comments: Increased time and effort scooting to EOB, only requiring assist for line management; pt able to state, "aren't I not supposed to push through my arms much" (in reference to sternal precautions), good use of LEs to assist    Transfers Overall transfer level: Needs assistance Equipment used: None;Rolling walker (2 wheeled) Transfers:  Sit to/from [facility] Sit to Stand: Min guard Stand pivot transfers: Min guard       General transfer comment: Initial stand and pivot to [facility] without DME (urgency to void), reaching to rail for UE support, min guard for balance; additional stand from [facility] to RW with min guard  Ambulation/Gait Ambulation/Gait assistance: Min guard Gait Distance (Feet): 4 Feet Assistive device: Rolling walker (2 wheeled) Gait Pattern/deviations: Step-to pattern;Trunk flexed Gait velocity: Decreased   General Gait Details: Steps to recliner with RW and min guard, assist for line management; further mobility limited by pt c/o dizziness with pallor and sweating, BP down to 102/65 (RN aware)  Stairs            Wheelchair Mobility    Modified Rankin (Stroke Patients Only)       Balance Overall balance assessment: Needs assistance   Sitting balance-Leahy Scale: Good       Standing balance-Leahy Scale: Fair Standing balance comment: Can static stand and take steps without UE support; dynamic stability improved with UE support                             Pertinent Vitals/Pain Pain Assessment: Faces Faces Pain Scale: Hurts a little bit Pain Location: Generalized, sternal incision Pain Descriptors / Indicators: Guarding;Discomfort;Tiring;Sore Pain Intervention(s): Limited activity within patient's tolerance;Monitored during session    [place] expects to be discharged to:: Private residence Living Arrangements: Alone Available Help at Discharge: Family;Available 24 hours/day Type of Home: House Home Access: Stairs to enter Entrance Stairs-Rails: Right Entrance Stairs-Number of Steps: 3-4 Home Layout: One  level Home Equipment: None Additional Comments: Daughter plans to stay with pt to provide help at d/c    Prior Function Level of Independence: Independent         Comments: Drives. Stands for showers. 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; used to drive lift truck for [employer]        Extremity/Trunk Assessment   Upper Extremity Assessment Upper Extremity Assessment: Generalized weakness    Lower Extremity Assessment Lower Extremity Assessment: Generalized weakness       Communication   Communication: Expressive difficulties  Cognition Arousal/Alertness: Awake/alert;Lethargic;Suspect due to medications Behavior During Therapy: Flat affect Overall Cognitive Status: No family/caregiver present to determine baseline cognitive functioning                                 General Comments: Flat affect, becoming more lethargic once upright with c/o dizziness requiring return to sit (suspect due to meds); initially appropriately conversant, following comands and answering questiosn; at times difficult to understand (pt reports daughter bringing rest of her teeth)      General Comments General comments (skin integrity, edema, etc.): Pt asking about d/c'ing home with eva walker, educ on likely not covered by insurance, difficulty fitting in home and does not fold down to fit in car; will likely benefit from RW or rollator for home use    Exercises     Assessment/Plan    PT Assessment Patient needs continued PT services  PT Problem List Decreased strength;Decreased activity tolerance;Decreased balance;Decreased mobility;Decreased cognition;Decreased knowledge of use of DME;Decreased knowledge of precautions;Cardiopulmonary status limiting activity;Pain       PT Treatment Interventions DME instruction;Gait training;Stair training;Functional mobility training;Therapeutic activities;Therapeutic exercise;Balance training;Patient/family education    PT Goals (Current goals can be found in the Care Plan section)  Acute Rehab PT Goals Patient Stated Goal: Return home with help from daughter PT Goal Formulation: With patient Time For Goal Achievement:  05/14/20 Potential to Achieve Goals: Good    Frequency Min 3X/week   Barriers to discharge        Co-evaluation               AM-PAC PT "6 Clicks" Mobility  Outcome Measure Help needed turning from your back to your side while in a flat bed without using bedrails?: A Little Help needed moving from lying on your back to sitting on the side of a flat bed without using bedrails?: A Little Help needed moving to and from a bed to a chair (including a wheelchair)?: A Little Help needed standing up from a chair using your arms (e.g., wheelchair or bedside chair)?: A Little Help needed to walk in hospital room?: A Little Help needed climbing 3-5 steps with a railing? : A Little 6 Click Score: 18    End of Session Equipment Utilized During Treatment: Gait belt Activity Tolerance: Treatment limited secondary to medical complications (Comment) (symptomatic hypotension) Patient left: in chair;with call bell/phone within reach;with chair alarm set Nurse Communication: Mobility status (symptoms/BP) PT Visit Diagnosis: Other abnormalities of gait and mobility (R26.89);Muscle weakness (generalized) (M62.81)    Time: 4196-2229 PT Time Calculation (min) (ACUTE ONLY): 25 min   Charges:   PT Evaluation $PT Eval Moderate Complexity: 1 Mod PT Treatments $Therapeutic Activity: 8-22 mins   Mabeline Caras, PT, DPT Acute Rehabilitation Services  Pager [phone] Office [phone]  Derry Lory 04/30/2020, 12:34  PM   

## 2020-04-30 NOTE — Progress Notes (Signed)
Pt transferred to floor, VSS, assisted to bed.  Telemetry set up and oriented patient to room.

## 2020-04-30 NOTE — Progress Notes (Signed)
3 Days Post-Op Procedure(s) (LRB): CORONARY ARTERY BYPASS GRAFTING (CABG) TIMES FIVE USING LEFT INTERNAL MAMMARY ARTERY, LEFT HARVESTED RADIAL ARTERY, RIGHT GREATER SAPHENOUS VEIN HARVESTED ENDOSCOPICALLY. (N/A) LEFT RADIAL ARTERY HARVEST (Left) TRANSESOPHAGEAL ECHOCARDIOGRAM (TEE) (N/A) INDOCYANINE GREEN FLUORESCENCE IMAGING (ICG) (N/A) Subjective: C/o of discomfort after "large bowel movement"  Objective: Vital signs in last 24 hours: Temp:  [97.6 F (36.4 C)-98.7 F (37.1 C)] 98.7 F (37.1 C) (11/13 0735) Pulse Rate:  [89-117] 90 (11/13 0700) Cardiac Rhythm: Sinus bradycardia (11/12 2000) Resp:  [10-24] 13 (11/13 0700) BP: (114-168)/(55-88) 136/74 (11/13 0700) SpO2:  [88 %-98 %] 92 % (11/13 0700) Weight:  [72.2 kg] 72.2 kg (11/13 0500)  Hemodynamic parameters for last 24 hours:    Intake/Output from previous day: 11/12 0701 - 11/13 0700 In: 410 [P.O.:360; IV Piggyback:50] Out: 2605 [Urine:2085; Chest Tube:520] Intake/Output this shift: No intake/output data recorded.  General appearance: alert, cooperative and no distress Neurologic: intact and flat affect Heart: regular rate and rhythm Lungs: diminished breath sounds bibasilar Abdomen: normal findings: soft, non-tender Wound: VAC in place  Lab Results: Recent Labs    04/29/20 0436 04/30/20 0428  WBC 20.4* 20.1*  HGB 10.2* 9.8*  HCT 32.4* 31.7*  PLT 241 274   BMET:  Recent Labs    04/29/20 0436 04/30/20 0428  NA 138 138  K 3.9 3.4*  CL 108 104  CO2 21* 27  GLUCOSE 154* 108*  BUN 13 21  CREATININE 0.63 0.73  CALCIUM 8.4* 8.5*    PT/INR:  Recent Labs    04/27/20 1508  LABPROT 17.0*  INR 1.4*   ABG    Component Value Date/Time   PHART 7.375 04/28/2020 1013   HCO3 22.9 04/28/2020 1013   TCO2 24 04/28/2020 1013   ACIDBASEDEF 2.0 04/28/2020 1013   O2SAT 99.0 04/28/2020 1013   CBG (last 3)  Recent Labs    04/29/20 2215 04/30/20 0340 04/30/20 0624  GLUCAP 144* 103* 120*     Assessment/Plan: S/P Procedure(s) (LRB): CORONARY ARTERY BYPASS GRAFTING (CABG) TIMES FIVE USING LEFT INTERNAL MAMMARY ARTERY, LEFT HARVESTED RADIAL ARTERY, RIGHT GREATER SAPHENOUS VEIN HARVESTED ENDOSCOPICALLY. (N/A) LEFT RADIAL ARTERY HARVEST (Left) TRANSESOPHAGEAL ECHOCARDIOGRAM (TEE) (N/A) INDOCYANINE GREEN FLUORESCENCE IMAGING (ICG) (N/A) Plan for transfer to step-down: see transfer orders  POD # 3 CV- in Sr  On ASA, beta blocker RESP- continue IS RENAL- creatinine OK  Still volume overloaded- continue diuresis  Hypokalemia- supplement K ENDO- CBG trending better  Resume amaryl, metformin, continue SSI Anemia secondary to ABL- stable, follow Leukocytosis- WBC elevated, stable, afebrile  Dc central line and Foley, monitor transfer to 4E   LOS: 5 days    Katrina Castro 04/30/2020

## 2020-04-30 NOTE — Evaluation (Signed)
Occupational Therapy Evaluation Patient Details Name: Katrina Castro MRN: [id number] DOB: 1945-12-23 Today's Date: 04/30/2020    History of Present Illness Pt is a 74 y.o. female admitted and s/p CABG x5 on 04/27/20 due to recent unstable angina with cath findings of multivessel disease. PMH includes DM, depression, arthritis, HTN, R TKA (2018).   Clinical Impression   Patient admitted for the above diagnosis and procedure.  She presents with fatigue, sternal soreness, fair stand balance, generalized weakness, decreased activity tolerance and flat affect.  All of which limit her ADL and mobility independence compared to her stated prior level of function.  See eval for details, but she independent with no need for an assistive device.  CIR is looking at her.  She believes she can transition home with home health and initial 24 hour supervision from her daughter for a week.  OT to follow in the acute setting to ensure a safe transition home vs higher level rehab.  Sternal precautions reviewed.      Follow Up Recommendations  Home health OT;Supervision/Assistance - 24 hour    Equipment Recommendations  Tub/shower seat    Recommendations for Other Services       Precautions / Restrictions Precautions Precautions: Sternal;Fall Precaution Comments: Pacing wires, sternal wound vac Restrictions Weight Bearing Restrictions: No      Mobility Bed Mobility Overal bed mobility: Needs Assistance Bed Mobility: Supine to Sit;Sit to Supine     Supine to sit: Min guard Sit to supine: Min guard        Transfers Overall transfer level: Needs assistance Equipment used: Rolling walker (2 wheeled) Transfers: Sit to/from Stand Sit to Stand: Min guard         General transfer comment: patient able to take a few steps towards HOB.  Declined recliner - was up for a few hours earlier.    Balance Overall balance assessment: Needs assistance Sitting-balance support: No upper extremity  supported;Feet supported Sitting balance-Leahy Scale: Good     Standing balance support: Bilateral upper extremity supported Standing balance-Leahy Scale: Fair                             ADL either performed or assessed with clinical judgement   ADL Overall ADL's : Needs assistance/impaired Eating/Feeding: Independent;Sitting   Grooming: Wash/dry hands;Wash/dry face;Sitting;Min guard           Upper Body Dressing : Minimal assistance;Sitting   Lower Body Dressing: Minimal assistance;Sit to/from stand               Functional mobility during ADLs: Min guard;Rolling walker       Vision Baseline Vision/History: No visual deficits Patient Visual Report: No change from baseline       Perception     Praxis      Pertinent Vitals/Pain Faces Pain Scale: Hurts a little bit Pain Location: Generalized, sternal incision Pain Descriptors / Indicators: Guarding;Sore Pain Intervention(s): Monitored during session     Hand Dominance Right   Extremity/Trunk Assessment Upper Extremity Assessment Upper Extremity Assessment: Generalized weakness   Lower Extremity Assessment Lower Extremity Assessment: Defer to PT evaluation   Cervical / Trunk Assessment Cervical / Trunk Assessment: Kyphotic   Communication Communication Communication: No difficulties   Cognition Arousal/Alertness: Awake/alert Behavior During Therapy: WFL for tasks assessed/performed;Flat affect Overall Cognitive Status: Within Functional Limits for tasks assessed  General Comments   VSS    Exercises     Shoulder Instructions      Home Living Family/patient expects to be discharged to:: Private residence Living Arrangements: Alone Available Help at Discharge: Family;Available 24 hours/day Type of Home: House Home Access: Stairs to enter [employer] of Steps: 3-4 Entrance Stairs-Rails: Right Home Layout: One level      Bathroom Shower/Tub: Teacher, early years/pre: Standard     Home Equipment: None   Additional Comments: Daughter plans to stay with pt to provide help at d/c for about a week      Prior Functioning/Environment Level of Independence: Independent        Comments: Drives. Stands for showers. 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  Requires no assist for meals, home management, meds, groceries.        OT Problem List: Decreased strength;Decreased activity tolerance;Impaired balance (sitting and/or standing);Decreased knowledge of use of DME or AE;Pain      OT Treatment/Interventions: Self-care/ADL training;Therapeutic exercise;DME and/or AE instruction;Balance training;Therapeutic activities    OT Goals(Current goals can be found in the care plan section) Acute Rehab OT Goals Patient Stated Goal: Return home with help from daughter OT Goal Formulation: With patient Time For Goal Achievement: 05/14/20 Potential to Achieve Goals: Good ADL Goals Pt Will Perform Grooming: with modified independence;standing Pt Will Perform Lower Body Bathing: with modified independence;sit to/from stand Pt Will Perform Lower Body Dressing: with modified independence;sit to/from stand Pt Will Transfer to Toilet: with modified independence;regular height toilet;ambulating  OT Frequency: Min 2X/week   Barriers to D/C: Decreased caregiver support          Co-evaluation              AM-PAC OT "6 Clicks" Daily Activity     Outcome Measure Help from another person eating meals?: None Help from another person taking care of personal grooming?: A Little Help from another person toileting, which includes using toliet, bedpan, or urinal?: A Little Help from another person bathing (including washing, rinsing, drying)?: A Lot Help from another person to put on and taking off regular upper body clothing?: A Little Help from another person to put on and taking off  regular lower body clothing?: A Little 6 Click Score: 18   End of Session Equipment Utilized During Treatment: Rolling walker Nurse Communication: Mobility status  Activity Tolerance: Patient limited by fatigue Patient left: in bed;with call bell/phone within reach;with bed alarm set  OT Visit Diagnosis: Unsteadiness on feet (R26.81);Muscle weakness (generalized) (M62.81);Pain Pain - part of body:  (sternal)                Time: 2122-4825 OT Time Calculation (min): 23 min Charges:  OT General Charges $OT Visit: 1 Visit OT Evaluation $OT Eval Moderate Complexity: 1 Mod  04/30/2020  Rich, OTR/L  Acute Rehabilitation Services  Office:  [place] 04/30/2020, 5:32 PM

## 2020-04-30 NOTE — Progress Notes (Signed)
Inpatient Rehab Admissions Coordinator:   Met with patient at bedside to discuss potential CIR admission. Pt. Stated interest but wanted to work with PT/OT to determine how much she can do for herself before making a decision about CIR. PT note now in, with recs for HH PT. Pt. Is currently supervision-min guard. I will not pursue for CIR admission.   Laura Staley, MS, CCC-SLP Rehab Admissions Coordinator  [phone] (celll) [phone] (office)   

## 2020-04-30 NOTE — Progress Notes (Signed)
Patient's daughter, [name], has called to get update and  would like to to get an update from the MD.

## 2020-05-01 ENCOUNTER — Inpatient Hospital Stay (HOSPITAL_COMMUNITY): Payer: Medicare Other

## 2020-05-01 LAB — CBC
HCT: 34.5 % — ABNORMAL LOW (ref 36.0–46.0)
Hemoglobin: 10.9 g/dL — ABNORMAL LOW (ref 12.0–15.0)
MCH: 26.6 pg (ref 26.0–34.0)
MCHC: 31.6 g/dL (ref 30.0–36.0)
MCV: 84.1 fL (ref 80.0–100.0)
Platelets: 303 10*3/uL (ref 150–400)
RBC: 4.1 MIL/uL (ref 3.87–5.11)
RDW: 15.4 % (ref 11.5–15.5)
WBC: 15.7 10*3/uL — ABNORMAL HIGH (ref 4.0–10.5)
nRBC: 0 % (ref 0.0–0.2)

## 2020-05-01 LAB — BASIC METABOLIC PANEL
Anion gap: 10 (ref 5–15)
BUN: 21 mg/dL (ref 8–23)
CO2: 24 mmol/L (ref 22–32)
Calcium: 8.5 mg/dL — ABNORMAL LOW (ref 8.9–10.3)
Chloride: 103 mmol/L (ref 98–111)
Creatinine, Ser: 0.72 mg/dL (ref 0.44–1.00)
GFR, Estimated: >60 mL/min (ref >60)
Glucose, Bld: 190 mg/dL — ABNORMAL HIGH (ref 70–99)
Potassium: 4 mmol/L (ref 3.5–5.1)
Sodium: 137 mmol/L (ref 135–145)

## 2020-05-01 LAB — GLUCOSE, CAPILLARY
Glucose-Capillary: 118 mg/dL — ABNORMAL HIGH (ref 70–99)
Glucose-Capillary: 170 mg/dL — ABNORMAL HIGH (ref 70–99)
Glucose-Capillary: 141 mg/dL — ABNORMAL HIGH (ref 70–99)
Glucose-Capillary: 117 mg/dL — ABNORMAL HIGH (ref 70–99)

## 2020-05-01 MED ORDER — ISOSORBIDE DINITRATE 10 MG PO TABS
5.0000 mg | ORAL_TABLET | Freq: Three times a day (TID) | ORAL | Status: DC
  Administered 2020-05-01 – 2020-05-10 (×27): 5 mg via ORAL
  Filled 2020-05-01 (×27): qty 1

## 2020-05-01 MED ORDER — LOPERAMIDE HCL 1 MG/7.5ML PO SUSP
2.0000 mg | ORAL | Status: DC | PRN
  Administered 2020-05-01 – 2020-05-05 (×5): 2 mg via ORAL
  Filled 2020-05-01 (×9): qty 15

## 2020-05-01 NOTE — Progress Notes (Addendum)
[address]       [place] [address]             [phone]      4 Days Post-Op Procedure(s) (LRB): CORONARY ARTERY BYPASS GRAFTING (CABG) TIMES FIVE USING LEFT INTERNAL MAMMARY ARTERY, LEFT HARVESTED RADIAL ARTERY, RIGHT GREATER SAPHENOUS VEIN HARVESTED ENDOSCOPICALLY. (N/A) LEFT RADIAL ARTERY HARVEST (Left) TRANSESOPHAGEAL ECHOCARDIOGRAM (TEE) (N/A) INDOCYANINE GREEN FLUORESCENCE IMAGING (ICG) (N/A) Subjective: Unhappy with food, some soreness  Objective: Vital signs in last 24 hours: Temp:  [97.8 F (36.6 C)-98.4 F (36.9 C)] 98.4 F (36.9 C) (11/14 0400) Pulse Rate:  [62-100] 96 (11/14 0400) Cardiac Rhythm: Normal sinus rhythm (11/13 1900) Resp:  [16-21] 21 (11/14 0632) BP: (91-169)/(57-88) 118/57 (11/14 0400) SpO2:  [91 %-98 %] 98 % (11/14 0400) Weight:  [68.5 kg] 68.5 kg (11/14 0632)  Hemodynamic parameters for last 24 hours:    Intake/Output from previous day: 11/13 0701 - 11/14 0700 In: 780 [P.O.:680; IV Piggyback:100] Out: 940 [Urine:930; Chest Tube:10] Intake/Output this shift: No intake/output data recorded.  General appearance: alert, cooperative and no distress Heart: regular rate and rhythm Lungs: dim in right>left base Abdomen: benign Extremities: minor edema Wound: incis healing well(evh), provena in place for chest incision  Lab Results: Recent Labs    04/29/20 0436 04/30/20 0428  WBC 20.4* 20.1*  HGB 10.2* 9.8*  HCT 32.4* 31.7*  PLT 241 274   BMET:  Recent Labs    04/29/20 0436 04/30/20 0428  NA 138 138  K 3.9 3.4*  CL 108 104  CO2 21* 27  GLUCOSE 154* 108*  BUN 13 21  CREATININE 0.63 0.73  CALCIUM 8.4* 8.5*    PT/INR: No results for input(s): LABPROT, INR in the last 72 hours. ABG    Component Value Date/Time   PHART 7.375 04/28/2020 1013   HCO3 22.9 04/28/2020 1013   TCO2 24 04/28/2020 1013   ACIDBASEDEF 2.0 04/28/2020 1013   O2SAT 99.0 04/28/2020 1013   CBG (last 3)  Recent Labs     04/30/20 1611 04/30/20 2056 05/01/20 0635  GLUCAP 121* 101* 118*    Meds Scheduled Meds: . acetaminophen  1,000 mg Oral Q6H   Or  . acetaminophen (TYLENOL) oral liquid 160 mg/5 mL  1,000 mg Per Tube Q6H  . aspirin EC  81 mg Oral Daily  . clopidogrel  75 mg Oral Daily  . colchicine  0.3 mg Oral BID  . docusate sodium  200 mg Oral Daily  . furosemide  40 mg Oral BID  . glimepiride  2 mg Oral Q breakfast  . insulin aspart  0-24 Units Subcutaneous TID AC & HS  . isosorbide dinitrate  10 mg Oral TID  . mouth rinse  15 mL Mouth Rinse BID  . metFORMIN  500 mg Oral BID WC  . metoprolol tartrate  25 mg Oral BID  . pantoprazole  40 mg Oral Daily  . potassium chloride  20 mEq Oral BID  . rosuvastatin  40 mg Oral Daily  . sodium chloride flush  3 mL Intravenous Q12H   Continuous Infusions: . sodium chloride     PRN Meds:.sodium chloride, alum & mag hydroxide-simeth, lip balm, ondansetron (ZOFRAN) IV, oxybutynin, oxyCODONE, sodium chloride flush, traMADol, zolpidem  Xrays DG Chest Port 1 View  Result Date: 04/30/2020 CLINICAL DATA:  Status post coronary artery bypass grafting. Atelectasis. EXAM: PORTABLE CHEST 1 VIEW COMPARISON:  April 29, 2020. FINDINGS: Cordis tip is in the superior  vena cava. Chest tubes and mediastinal drain unchanged in position. No pneumothorax. There is atelectatic change in the lung base regions. No consolidation. Heart size and pulmonary vascularity within normal limits. No adenopathy. No bone lesions. IMPRESSION: Tube and catheter positions as described without pneumothorax. Stable bibasilar atelectatic change. No new opacity evident. Stable cardiac silhouette. Electronically Signed   By: Lowella Grip III M.D.   On: 04/30/2020 08:28    Assessment/Plan: S/P Procedure(s) (LRB): CORONARY ARTERY BYPASS GRAFTING (CABG) TIMES FIVE USING LEFT INTERNAL MAMMARY ARTERY, LEFT HARVESTED RADIAL ARTERY, RIGHT GREATER SAPHENOUS VEIN HARVESTED ENDOSCOPICALLY.  (N/A) LEFT RADIAL ARTERY HARVEST (Left) TRANSESOPHAGEAL ECHOCARDIOGRAM (TEE) (N/A) INDOCYANINE GREEN FLUORESCENCE IMAGING (ICG) (N/A)  POD#4 1 afeb, BP well controlled except a couple HTN readings, holding nitrate at times for low BP, will reduce to 5 mg 2 sats good on RA 3 sinus rhythm 4 labs are pending from this am 5 BS controlled- home meds have been resumed 6 weight almost at baseline, UOP not accurate, she says she voided a lot- will d/c lasix 7 CXR- + basilar atx 8 push rehab/pulm toilet as able   LOS: 6 days    John Giovanni PA-C Pager [phone] 05/01/2020 Patient seen and examined, agree with above Very upset and crying- C/o diarrhea with 6 BM this AM- not on laxatives currently  Will order Imodium  Remo Lipps C. Roxan Hockey, MD [facility] [phone]

## 2020-05-01 NOTE — Progress Notes (Signed)
Mobility Specialist: Progress Note   05/01/20 1217  Mobility  Activity Refused mobility   Pt refused mobility due to BM frequency. Will f/u tomorrow.   [facility] Ramari Bray Mobility Specialist

## 2020-05-02 LAB — GASTROINTESTINAL PANEL BY PCR, STOOL (REPLACES STOOL CULTURE)

## 2020-05-02 LAB — CBC
HCT: 32 % — ABNORMAL LOW (ref 36.0–46.0)
Hemoglobin: 10.3 g/dL — ABNORMAL LOW (ref 12.0–15.0)
MCH: 27.4 pg (ref 26.0–34.0)
MCHC: 32.2 g/dL (ref 30.0–36.0)
MCV: 85.1 fL (ref 80.0–100.0)
Platelets: 210 10*3/uL (ref 150–400)
RBC: 3.76 MIL/uL — ABNORMAL LOW (ref 3.87–5.11)
RDW: 15.3 % (ref 11.5–15.5)
WBC: 12.8 10*3/uL — ABNORMAL HIGH (ref 4.0–10.5)
nRBC: 0 % (ref 0.0–0.2)

## 2020-05-02 LAB — BASIC METABOLIC PANEL
Anion gap: 10 (ref 5–15)
BUN: 13 mg/dL (ref 8–23)
CO2: 23 mmol/L (ref 22–32)
Calcium: 8.4 mg/dL — ABNORMAL LOW (ref 8.9–10.3)
Chloride: 104 mmol/L (ref 98–111)
Creatinine, Ser: 0.64 mg/dL (ref 0.44–1.00)
GFR, Estimated: >60 mL/min (ref >60)
Glucose, Bld: 130 mg/dL — ABNORMAL HIGH (ref 70–99)
Potassium: 3.8 mmol/L (ref 3.5–5.1)
Sodium: 137 mmol/L (ref 135–145)

## 2020-05-02 LAB — GLUCOSE, CAPILLARY
Glucose-Capillary: 117 mg/dL — ABNORMAL HIGH (ref 70–99)
Glucose-Capillary: 118 mg/dL — ABNORMAL HIGH (ref 70–99)
Glucose-Capillary: 98 mg/dL (ref 70–99)
Glucose-Capillary: 94 mg/dL (ref 70–99)

## 2020-05-02 MED ORDER — TRAMADOL HCL 50 MG PO TABS
50.0000 mg | ORAL_TABLET | Freq: Four times a day (QID) | ORAL | Status: DC | PRN
  Administered 2020-05-05 – 2020-05-09 (×4): 50 mg via ORAL
  Filled 2020-05-02 (×5): qty 1

## 2020-05-02 MED ORDER — TRAMADOL HCL 50 MG PO TABS: 50.0000 mg | ORAL_TABLET | ORAL | Status: DC | PRN

## 2020-05-02 NOTE — Progress Notes (Signed)
Mobility Specialist: Progress Note   05/02/20 1545  Mobility  Activity Refused mobility   Pt refused mobility due to feeling nauseated. Will f/u tomorrow.   [facility] Kenzli Barritt Mobility Specialist

## 2020-05-02 NOTE — Progress Notes (Signed)
Physical Therapy Treatment Patient Details Name: Katrina Castro MRN: [id number] DOB: [birth year] Today's Date: 05/02/2020    History of Present Illness Pt is a 74 y.o. female admitted and s/p CABG x5 on 04/27/20 due to recent unstable angina with cath findings of multivessel disease. PMH includes DM, depression, arthritis, HTN, R TKA (2018).    PT Comments    Pt was limited by feelings of light-headedness in which she described as she felt her head "was just not right" today. Her BP was monitored with changes in position (noted below in General Comments), but it was taken on her R ankle. Her symptoms did not improve or worsen with changes in position. Focused remainder of session on educating pt and her family member on sternal precautions. Provided sternal precautions handout. Pt was able to state that she is not supposed to reach overhead or pull with her UEs. However, she demonstrated poor learning as she required cues and assistance to maintain her precautions when performing mobility tasks. Will continue to follow acutely.    Follow Up Recommendations  Home health PT;Supervision for mobility/OOB     Equipment Recommendations  Rolling walker with 5" wheels;3in1 (PT) (TBD rollator vs RW)    Recommendations for Other Services       Precautions / Restrictions Precautions Precautions: Sternal;Fall;Other (comment) Precaution Comments: Pacing wires, sternal wound vac, reviewed sternal precuations and provided handout Restrictions Weight Bearing Restrictions: No    Mobility  Bed Mobility Overal bed mobility: Needs Assistance Bed Mobility: Sit to Supine;Rolling;Sidelying to Sit Rolling: Min assist Sidelying to sit: Min assist   Sit to supine: Mod assist   General bed mobility comments: Increased time and cues to not pull with hands on bed rails to maintain sternal precuations. Cued pt to utilize abdominal muscles to ascend trunk. ModA to manage trunk and LEs back to supine as pt  would tend to reach back with L UE despite cues to not do so.  Transfers Overall transfer level: Needs assistance Equipment used: Rolling walker (2 wheeled) Transfers: Sit to/from Stand Sit to Stand: Min assist         General transfer comment: Pt agreed to one sit to stand transfer to assess changes in BP. Extra time and unsteadiness noted with powering up to stand. Required assistance to initiate.  Ambulation/Gait             General Gait Details: Held off on gait due to pt's request and symptoms this date   Stairs             Wheelchair Mobility    Modified Rankin (Stroke Patients Only)       Balance Overall balance assessment: Needs assistance Sitting-balance support: No upper extremity supported;Feet supported Sitting balance-Leahy Scale: Good Sitting balance - Comments: Pt able to maintain static sitting balance EOB with supervision.   Standing balance support: Bilateral upper extremity supported Standing balance-Leahy Scale: Poor Standing balance comment: B UE on RW for static standing, with unsteadiness noted but no overt LOB.                            Cognition Arousal/Alertness: Awake/alert Behavior During Therapy: Anxious Overall Cognitive Status: Within Functional Limits for tasks assessed                                 General Comments: Pt anxious in regards to her head "  not feeling right" this date and reported anxiety with ambulating. Pt unclear with changes in symptoms and required questions repeated several times to get a definitive answer,      Exercises      General Comments General comments (skin integrity, edema, etc.): BP taken on R ankle this date: 158/77 start of session supine with HOB elevated, 183/114 sitting EOB, 167/120 after several min sitting EOB, 196/107 standing, 169/72 once returned to supine with HOB elevated end of session. Symptoms remained consistent and did not worsen/improve throughout  session.      Pertinent Vitals/Pain Pain Assessment: Faces Faces Pain Scale: Hurts a little bit Pain Location: Generalized, sternal incision Pain Descriptors / Indicators: Guarding;Sore Pain Intervention(s): Limited activity within patient's tolerance;Monitored during session;Repositioned    Home Living                      Prior Function            PT Goals (current goals can now be found in the care plan section) Acute Rehab PT Goals Patient Stated Goal: to get better PT Goal Formulation: With patient Time For Goal Achievement: 05/14/20 Potential to Achieve Goals: Good Progress towards PT goals: Not progressing toward goals - comment (limited by light-headed feeling this date)    Frequency    Min 3X/week      PT Plan Current plan remains appropriate    Co-evaluation              AM-PAC PT "6 Clicks" Mobility   Outcome Measure  Help needed turning from your back to your side while in a flat bed without using bedrails?: A Little Help needed moving from lying on your back to sitting on the side of a flat bed without using bedrails?: A Little Help needed moving to and from a bed to a chair (including a wheelchair)?: A Little Help needed standing up from a chair using your arms (e.g., wheelchair or bedside chair)?: A Little Help needed to walk in hospital room?: A Little Help needed climbing 3-5 steps with a railing? : A Little 6 Click Score: 18    End of Session   Activity Tolerance: Treatment limited secondary to medical complications (Comment) (light headedness) Patient left: in bed;with call bell/phone within reach;with bed alarm set;with family/visitor present (daughter arrived at end of session) Nurse Communication: Mobility status;Other (comment) (BP changes and symptoms, yellow phlegm) PT Visit Diagnosis: Other abnormalities of gait and mobility (R26.89);Muscle weakness (generalized) (M62.81);Unsteadiness on feet (R26.81)     Time:  1443-1540 PT Time Calculation (min) (ACUTE ONLY): 32 min  Charges:  $Therapeutic Activity: 23-37 mins                     Moishe Spice, PT, DPT Acute Rehabilitation Services  Pager: [phone] Office: [place] 05/02/2020, 3:39 PM

## 2020-05-02 NOTE — Progress Notes (Addendum)
Occupational Therapy Treatment Patient Details Name: Katrina Castro MRN: [id number] DOB: June 01, 1946 Today's Date: 05/02/2020    History of present illness Pt is a 74 y.o. female admitted and s/p CABG x5 on 04/27/20 due to recent unstable angina with cath findings of multivessel disease. PMH includes DM, depression, arthritis, HTN, R TKA (2018).   OT comments  Upon arrival, pt supine in bed and reporting she walked earlier this AM and became "woozy headed". Focused session on education for precautions and compensatory techniques. Pt reporting she does not recall any sternal precautions. Provided review of precautions and pt repeating back to therapist. Pt performing figure four method to adjust socks at bed level. Pt declining out of bed activity. Continue to recommend dc to home with HHOT and will continue to follow acutely as admitted.  HR 80s, SpO2 100% on RA, and BP supine 145/80 (100)   Follow Up Recommendations  Home health OT;Supervision/Assistance - 24 hour    Equipment Recommendations  Tub/shower seat    Recommendations for Other Services      Precautions / Restrictions Precautions Precautions: Sternal;Fall;Other (comment) Precaution Comments: Pacing wires, sternal wound vac. Pt reporting she doesn't recall sternal precautions. Reviewed and had pt repeat back. Restrictions Weight Bearing Restrictions: No       Mobility Bed Mobility                  Transfers                      Balance                                           ADL either performed or assessed with clinical judgement   ADL Overall ADL's : Needs assistance/impaired                     Lower Body Dressing: Minimal assistance;Bed level Lower Body Dressing Details (indicate cue type and reason): Providing education on LB dressing techniques. Pt demonsatrating understanding by performing figure four method to adjust socks while at bed level.                General ADL Comments: Pt reporting increased fatigue and feeling swimmy headed from mobility earlier this AM. Focused session on compensatory techniques for ADLs and education on precautions. Pt reporting she doesn't remember any of the sternal precautions. Reviewing no pushing/pulling, no bending forward, and keeping BUEs in the tube.      Vision       Perception     Praxis      Cognition Arousal/Alertness: Awake/alert Behavior During Therapy: Flat affect Overall Cognitive Status: Within Functional Limits for tasks assessed                                 General Comments: Pt reporting she feels very fatigued from mobility this morning and had a "woozy" spell. towards end of session, pt closing her eyes and "falling asleep"        Exercises     Shoulder Instructions       General Comments HR 80s, SpO2 100% on RA, and BP supine 145/80 (100)    Pertinent Vitals/ Pain       Pain Assessment: Faces Faces Pain Scale: Hurts a little bit Pain Location: Generalized,  sternal incision Pain Descriptors / Indicators: Guarding;Sore Pain Intervention(s): Monitored during session  Home Living                                          Prior Functioning/Environment              Frequency  Min 2X/week        Progress Toward Goals  OT Goals(current goals can now be found in the care plan section)  Progress towards OT goals: Progressing toward goals  Acute Rehab OT Goals Patient Stated Goal: Return home with help from daughter OT Goal Formulation: With patient Time For Goal Achievement: 05/14/20 Potential to Achieve Goals: Good ADL Goals Pt Will Perform Grooming: with modified independence;standing Pt Will Perform Lower Body Bathing: with modified independence;sit to/from stand Pt Will Perform Lower Body Dressing: with modified independence;sit to/from stand Pt Will Transfer to Toilet: with modified independence;regular height  toilet;ambulating  Plan Discharge plan remains appropriate    Co-evaluation                 AM-PAC OT "6 Clicks" Daily Activity     Outcome Measure   Help from another person eating meals?: None Help from another person taking care of personal grooming?: A Little Help from another person toileting, which includes using toliet, bedpan, or urinal?: A Little Help from another person bathing (including washing, rinsing, drying)?: A Lot Help from another person to put on and taking off regular upper body clothing?: A Little Help from another person to put on and taking off regular lower body clothing?: A Little 6 Click Score: 18    End of Session    OT Visit Diagnosis: Unsteadiness on feet (R26.81);Muscle weakness (generalized) (M62.81);Pain Pain - part of body:  (sternal)   Activity Tolerance Patient limited by fatigue   Patient Left in bed;with call bell/phone within reach;with bed alarm set   Nurse Communication Mobility status        Time: 7782-4235 OT Time Calculation (min): 13 min  Charges: OT General Charges $OT Visit: 1 Visit OT Treatments $Self Care/Home Management : 8-22 mins  [place], OTR/L Acute Rehab Pager: [phone] Office: [place] 05/02/2020, 12:29 PM

## 2020-05-02 NOTE — Progress Notes (Signed)
[address]       [place] [address]             [phone]      5 Days Post-Op Procedure(s) (LRB): CORONARY ARTERY BYPASS GRAFTING (CABG) TIMES FIVE USING LEFT INTERNAL MAMMARY ARTERY, LEFT HARVESTED RADIAL ARTERY, RIGHT GREATER SAPHENOUS VEIN HARVESTED ENDOSCOPICALLY. (N/A) LEFT RADIAL ARTERY HARVEST (Left) TRANSESOPHAGEAL ECHOCARDIOGRAM (TEE) (N/A) INDOCYANINE GREEN FLUORESCENCE IMAGING (ICG) (N/A) Subjective: Feels okay this morning. She does have some issues with hearing voices in her room and wanting to switch rooms.   Objective: Vital signs in last 24 hours: Temp:  [98.3 F (36.8 C)-99.4 F (37.4 C)] 99.1 F (37.3 C) (11/15 0750) Pulse Rate:  [80-103] 95 (11/15 0418) Cardiac Rhythm: Sinus tachycardia (11/15 0700) Resp:  [18-20] 18 (11/15 0750) BP: (111-145)/(57-104) 145/73 (11/15 0750) SpO2:  [93 %-100 %] 100 % (11/15 0750) Weight:  [71.8 kg] 71.8 kg (11/15 0418)  Intake/Output from previous day: 11/14 0701 - 11/15 0700 In: 350 [P.O.:350] Out: 850 [Urine:850] Intake/Output this shift: No intake/output data recorded.  General appearance: alert, cooperative and no distress Heart: regular rate and rhythm, S1, S2 normal, no murmur, click, rub or gallop Lungs: clear to auscultation bilaterally Abdomen: soft, non-tender; bowel sounds normal; no masses,  no organomegaly Extremities: extremities normal, atraumatic, no cyanosis or edema Wound: clean and dry with prevena in place  Lab Results: Recent Labs    05/01/20 1122 05/02/20 0354  WBC 15.7* 12.8*  HGB 10.9* 10.3*  HCT 34.5* 32.0*  PLT 303 210   BMET:  Recent Labs    05/01/20 1122 05/02/20 0354  NA 137 137  K 4.0 3.8  CL 103 104  CO2 24 23  GLUCOSE 190* 130*  BUN 21 13  CREATININE 0.72 0.64  CALCIUM 8.5* 8.4*    PT/INR: No results for input(s): LABPROT, INR in the last 72 hours. ABG    Component Value Date/Time   PHART 7.375 04/28/2020 1013   HCO3 22.9 04/28/2020 1013     TCO2 24 04/28/2020 1013   ACIDBASEDEF 2.0 04/28/2020 1013   O2SAT 99.0 04/28/2020 1013   CBG (last 3)  Recent Labs    05/01/20 1632 05/01/20 2133 05/02/20 0657  GLUCAP 141* 117* 117*    Assessment/Plan: S/P Procedure(s) (LRB): CORONARY ARTERY BYPASS GRAFTING (CABG) TIMES FIVE USING LEFT INTERNAL MAMMARY ARTERY, LEFT HARVESTED RADIAL ARTERY, RIGHT GREATER SAPHENOUS VEIN HARVESTED ENDOSCOPICALLY. (N/A) LEFT RADIAL ARTERY HARVEST (Left) TRANSESOPHAGEAL ECHOCARDIOGRAM (TEE) (N/A) INDOCYANINE GREEN FLUORESCENCE IMAGING (ICG) (N/A)  1. CV-NSR in the 90s to ST in the low 100s. BP better this morning. Continue asa and lopressor, statin allergy 2. Pulm- tolerating room air with good oxygen saturation. Small left pleural effusion on CXR.  3. Renal-creatinine 0.64, electrolytes okay 4. H and H 10.3/32.0, expected acute blood loss anemia 5. Endo- blood glucose well controlled on current regimen 6. Continue isordil for radial artery harvest. Reduced to 5mg  over the weekend due to hypotension. 7. Diarrhea- improved. Continue Imodium as needed.  8. Hallucinations? She states she feels unsafe in her room due to voices she hears at times. She is requesting another room. I told her this would be difficult based on the number of patients currently in the hospital. Avoid narcotics, use tramadol for pain.   Plan: Continue medical management. Encouraged ambulation in the halls. Lasix discontinued, she remains fluid balance negative. Continue use of incentive spirometer. She may be able to discharge in the next few days.  LOS: 7 days    Katrina Castro 05/02/2020

## 2020-05-02 NOTE — Progress Notes (Signed)
Nurse contacted Nicholes Rough PA-C as daughter, who is at bedside, was wondering when a surgeon would come see her mother. I informed daughter that Dr. Orvan Seen is unavailable;however, one of his surgical partners would be by later. Daughter is also concerned that patient has had some confusion post op. I explained may be related to delirium (no history of dementia) and should improve with time. She also has weakness and dizziness with ambulation at times;she has not exhibited any hypotension or ortho stasis. H and H this am stable at 10.3 and 32. Ambien that was ordered PRN was stopped and Ultram was decreased to 50 mg Q 6 hours PRN. She is not on Oxycodone.

## 2020-05-02 NOTE — Progress Notes (Signed)
CARDIAC REHAB PHASE I   PRE:  Rate/Rhythm: 105 ST    BP: sitting 165/74 leg    SaO2: 100 RA  MODE:  Ambulation: 130 ft   POST:  Rate/Rhythm: 110 ST    BP: sitting 166/80 in leg     SaO2: 99 RA  Pt feeling well. Moved to EOB and stood independently with verbal cues. To BSC for BM and we assisted in clean up. Pt talkative during encounter, no c/o. Pt ambulated hall with rollator, assist x2 standby initially. Pt began having leg weakness with slight buckling after 120 ft. Had to sit in hall at 130 ft due to slow responsiveness to questions and weak legs. Continued to feel weak and diaphoretic sitting therefore pushed rollator back to room and to recliner. Pt moved to recliner, BP in leg the same as before getting up, 166/80, however pt continued to feel poorly. Flat affect, quiet, c/o weakness and dizziness. Eventually moved to bed and pt immediately fell asleep. VSS. Notified RN and also discussed pt with her daughter who is working on d/c plans. She sts she can be with her at d/c but plans to work a few days till then.  [place], ACSM 05/02/2020 10:17 AM

## 2020-05-03 LAB — GLUCOSE, CAPILLARY
Glucose-Capillary: 83 mg/dL (ref 70–99)
Glucose-Capillary: 116 mg/dL — ABNORMAL HIGH (ref 70–99)
Glucose-Capillary: 138 mg/dL — ABNORMAL HIGH (ref 70–99)
Glucose-Capillary: 147 mg/dL — ABNORMAL HIGH (ref 70–99)

## 2020-05-03 MED ORDER — METOPROLOL TARTRATE 50 MG PO TABS
50.0000 mg | ORAL_TABLET | Freq: Two times a day (BID) | ORAL | Status: DC
  Administered 2020-05-03 – 2020-05-10 (×14): 50 mg via ORAL
  Filled 2020-05-03 (×14): qty 1

## 2020-05-03 MED ORDER — GUAIFENESIN ER 600 MG PO TB12
600.0000 mg | ORAL_TABLET | Freq: Two times a day (BID) | ORAL | Status: DC
  Administered 2020-05-03 – 2020-05-10 (×14): 600 mg via ORAL
  Filled 2020-05-03 (×15): qty 1

## 2020-05-03 MED ORDER — ACETAMINOPHEN 325 MG PO TABS
650.0000 mg | ORAL_TABLET | Freq: Four times a day (QID) | ORAL | Status: DC | PRN
  Administered 2020-05-07 – 2020-05-08 (×4): 650 mg via ORAL
  Filled 2020-05-03 (×5): qty 2

## 2020-05-03 MED ORDER — AMIODARONE HCL IN DEXTROSE 360-4.14 MG/200ML-% IV SOLN
30.0000 mg/h | INTRAVENOUS | Status: DC
  Administered 2020-05-04: 30 mg/h via INTRAVENOUS
  Filled 2020-05-03 (×2): qty 200

## 2020-05-03 MED ORDER — METOPROLOL TARTRATE 25 MG PO TABS
37.5000 mg | ORAL_TABLET | Freq: Two times a day (BID) | ORAL | Status: DC
  Administered 2020-05-03: 37.5 mg via ORAL
  Filled 2020-05-03: qty 1

## 2020-05-03 MED ORDER — GLUCERNA SHAKE PO LIQD
237.0000 mL | Freq: Three times a day (TID) | ORAL | Status: DC
  Administered 2020-05-03 (×3): 237 mL via ORAL

## 2020-05-03 MED ORDER — AMIODARONE HCL IN DEXTROSE 360-4.14 MG/200ML-% IV SOLN
60.0000 mg/h | INTRAVENOUS | Status: AC
  Administered 2020-05-03: 60 mg/h via INTRAVENOUS
  Filled 2020-05-03: qty 200

## 2020-05-03 MED ORDER — AMIODARONE LOAD VIA INFUSION
150.0000 mg | Freq: Once | INTRAVENOUS | Status: AC
  Administered 2020-05-03: 150 mg via INTRAVENOUS
  Filled 2020-05-03: qty 83.34

## 2020-05-03 NOTE — Progress Notes (Signed)
CARDIAC REHAB PHASE I   PRE:  Rate/Rhythm: 82 SR    BP: lying 145/65, sitting 162/86, standing 145/69    SaO2: 97 RA  MODE:  Ambulation: 240 ft   POST:  Rate/Rhythm: 91 SR    BP: sitting 127/80, 133/76     SaO2: 99 RA   Diaphoretic even before getting up. Pt without significant orthostasis initially. Walked with rollator, fairly steady, gait belt, assist x2 with slight steadying. Pt able to walk farther today but becomes weak and not as responsive. Declined sitting, wanted to get back to room. Processing is impaired, pt focusing on insignificant things while trying to get to recliner to rest. BP 127/80, pt exhausted and flat.  Left in recliner. Pts IS was in closet. Practiced x2, did not reach 250 mL. Pt sts she can't do anymore right now. RN to remind her. Not ready for d/c. 1130-1209  [place], ACSM 05/03/2020 12:02 PM

## 2020-05-03 NOTE — Progress Notes (Signed)
Amiodarone Drug - Drug Interaction Consult Note  Recommendations: - On colchicine 0.3mg  BID monitor for colchicine toxicity  - Monitor HR with metoprolol and amiodarone interaction  - Monitor glucose with glimepiride   Amiodarone is metabolized by the cytochrome P450 system and therefore has the potential to cause many drug interactions. Amiodarone has an average plasma half-life of 50 days (range 20 to 100 days).   There is potential for drug interactions to occur several weeks or months after stopping treatment and the onset of drug interactions may be slow after initiating amiodarone.   [x]  Statins: Increased risk of myopathy. Simvastatin- restrict dose to 20mg  daily. Other statins: counsel patients to report any muscle pain or weakness immediately.  []  Anticoagulants: Amiodarone can increase anticoagulant effect. Consider warfarin dose reduction. Patients should be monitored closely and the dose of anticoagulant altered accordingly, remembering that amiodarone levels take several weeks to stabilize.  []  Antiepileptics: Amiodarone can increase plasma concentration of phenytoin, the dose should be reduced. Note that small changes in phenytoin dose can result in large changes in levels. Monitor patient and counsel on signs of toxicity.  [x]  Beta blockers: increased risk of bradycardia, AV block and myocardial depression. Sotalol - avoid concomitant use.  []   Calcium channel blockers (diltiazem and verapamil): increased risk of bradycardia, AV block and myocardial depression.  []   Cyclosporine: Amiodarone increases levels of cyclosporine. Reduced dose of cyclosporine is recommended.  []  Digoxin dose should be halved when amiodarone is started.  []  Diuretics: increased risk of cardiotoxicity if hypokalemia occurs.  [x]  Oral hypoglycemic agents (glyburide, glipizide, glimepiride): increased risk of hypoglycemia. Patient's glucose levels should be monitored closely when initiating amiodarone  therapy.   []  Drugs that prolong the QT interval:  Torsades de pointes risk may be increased with concurrent use - avoid if possible.  Monitor QTc, also keep magnesium/potassium WNL if concurrent therapy can't be avoided. Marland Kitchen Antibiotics: e.g. fluoroquinolones, erythromycin. . Antiarrhythmics: e.g. quinidine, procainamide, disopyramide, sotalol. . Antipsychotics: e.g. phenothiazines, haloperidol.  . Lithium, tricyclic antidepressants, and methadone. Thank You,   Cristela Felt, PharmD Clinical Pharmacist  05/03/2020 6:49 PM

## 2020-05-03 NOTE — Progress Notes (Addendum)
[address]       [place] [address]             [phone]      6 Days Post-Op Procedure(s) (LRB): CORONARY ARTERY BYPASS GRAFTING (CABG) TIMES FIVE USING LEFT INTERNAL MAMMARY ARTERY, LEFT HARVESTED RADIAL ARTERY, RIGHT GREATER SAPHENOUS VEIN HARVESTED ENDOSCOPICALLY. (N/A) LEFT RADIAL ARTERY HARVEST (Left) TRANSESOPHAGEAL ECHOCARDIOGRAM (TEE) (N/A) INDOCYANINE GREEN FLUORESCENCE IMAGING (ICG) (N/A) Subjective: Feels okay this morning. Asking when she can go home.  Objective: Vital signs in last 24 hours: Temp:  [97.8 F (36.6 C)-99.1 F (37.3 C)] 98.3 F (36.8 C) (11/16 0313) Pulse Rate:  [79-93] 92 (11/16 0313) Cardiac Rhythm: Normal sinus rhythm (11/15 1900) Resp:  [18-24] 22 (11/16 0313) BP: (132-159)/(61-73) 159/71 (11/16 0313) SpO2:  [100 %] 100 % (11/16 0313) Weight:  [65.9 kg] 65.9 kg (11/16 0500)     Intake/Output from previous day: 11/15 0701 - 11/16 0700 In: 600 [P.O.:600] Out: 900 [Urine:900] Intake/Output this shift: No intake/output data recorded.  General appearance: alert, cooperative and no distress Heart: regular rate and rhythm, S1, S2 normal, no murmur, click, rub or gallop Lungs: clear to auscultation bilaterally Abdomen: soft, non-tender; bowel sounds normal; no masses,  no organomegaly Extremities: extremities normal, atraumatic, no cyanosis or edema Wound: clean and dry, prevena removed today  Lab Results: Recent Labs    05/01/20 1122 05/02/20 0354  WBC 15.7* 12.8*  HGB 10.9* 10.3*  HCT 34.5* 32.0*  PLT 303 210   BMET:  Recent Labs    05/01/20 1122 05/02/20 0354  NA 137 137  K 4.0 3.8  CL 103 104  CO2 24 23  GLUCOSE 190* 130*  BUN 21 13  CREATININE 0.72 0.64  CALCIUM 8.5* 8.4*    PT/INR: No results for input(s): LABPROT, INR in the last 72 hours. ABG    Component Value Date/Time   PHART 7.375 04/28/2020 1013   HCO3 22.9 04/28/2020 1013   TCO2 24 04/28/2020 1013   ACIDBASEDEF 2.0 04/28/2020  1013   O2SAT 99.0 04/28/2020 1013   CBG (last 3)  Recent Labs    05/02/20 1626 05/02/20 2108 05/03/20 0611  GLUCAP 98 94 83    Assessment/Plan: S/P Procedure(s) (LRB): CORONARY ARTERY BYPASS GRAFTING (CABG) TIMES FIVE USING LEFT INTERNAL MAMMARY ARTERY, LEFT HARVESTED RADIAL ARTERY, RIGHT GREATER SAPHENOUS VEIN HARVESTED ENDOSCOPICALLY. (N/A) LEFT RADIAL ARTERY HARVEST (Left) TRANSESOPHAGEAL ECHOCARDIOGRAM (TEE) (N/A) INDOCYANINE GREEN FLUORESCENCE IMAGING (ICG) (N/A)   1. CV-NSR in the 90s. BP slowly climbing this morning. Continue asa and lopressor, statin allergy. Will continue to titrate her BB for optimal BP and HR control.  2. Pulm- tolerating room air with good oxygen saturation. Small left pleural effusion on CXR.  3. Renal-creatinine 0.64, electrolytes okay 4. H and H 10.3/32.0, expected acute blood loss anemia 5. Endo- blood glucose well controlled on current regimen 6. Continue isordil for radial artery harvest. Reduced to 5mg  over the weekend due to hypotension. 7. Diarrhea- improved. Continue Imodium as needed. 8. Confusion/Delrium-seems to have improved this morning. She did have an episode of vertigo yesterday while walking in the halls and she claims her BP was high during this episode. Encouraged PO intake and fluids-she did not eat or drink much yesterday. Added glucerna for supplementation.   Plan: Discontinue EPW today. Continue to monitor BP/HR closely. Ambulate in the halls today. Encouraged PO intake and fluid intake. Blood glucose has been well controlled. Making good progress. Anticipate discharge Thursday if  she remains stable. Spoke with Daughter, [name], and gave her an update on the patient.     LOS: 8 days    Elgie Collard 05/03/2020   I have seen and examined the patient and agree with the assessment and plan as outlined.  Seems to be doing better.  Didn't sleep much last night but confusion has resolved.  Diarrhea reportedly improving.  Mostly  NSR w/ a couple brief runs PAF.  Will increase metoprolol to preop dose and monitor.  Possibly ready for d/c home in 1-2 days  Rexene Alberts, MD 05/03/2020 8:59 AM

## 2020-05-03 NOTE — Care Management Important Message (Signed)
Important Message  Patient Details  Name: BIRDA DIDONATO MRN: [id number] Date of Birth: 1945/10/12   Medicare Important Message Given:  Yes     Shelda Altes 05/03/2020, 8:59 AM

## 2020-05-03 NOTE — Progress Notes (Signed)
EPW removed per protocol. VSS. Tips intact. Patient educated about one hour bedrest. Will continue to monitor closely.   Gailen Shelter RN

## 2020-05-03 NOTE — TOC Initial Note (Signed)
Transition of Care (TOC) - Initial/Assessment Note  Marvetta Gibbons RN, BSN Transitions of Care Unit 4E- RN Case Manager See Treatment Team for direct phone #    Patient Details  Name: Katrina Castro MRN: [id number] Date of Birth: 09/25/1945  [facility] of [facility] CM/SW Contact:    Dawayne Patricia, RN Phone Number: 05/03/2020, 4:40 PM  Clinical Narrative:                 Pt s/p CABG from home, daughter to assist on discharge. Orders placed for HHPT/OT- CM spoke with patient and daughter at bedside to discuss transition of care needs- list provided for [facility] choice- Per CMS guidelines from medicare.gov website with star ratings (copy placed in shadow chart)- pt agreeable to services and per choice would like to use [facility] with [facility] as a backup. Also discussed DME needs- pt need 3n1 and rollator for home.  Address phone # and PCP all confirmed in epic along with daughter's phone as backup  Call made to [employer] for DME referral- 3n1 and rollator to be delivered to room prior to discharge.   Call made to [facility] with [facility] for HHPT/OT referral- pending return call to see if they can accept.  Will f/u in am    Expected Discharge Plan: [place] Barriers to Discharge: Continued Medical Work up   Patient Goals and CMS Choice Patient states their goals for this hospitalization and ongoing recovery are:: return home and recover CMS Medicare.gov Compare Post Acute Care list provided to:: Patient Choice offered to / list presented to : Patient, Adult Children  Expected Discharge Plan and Services Expected Discharge Plan: [place]   Discharge Planning Services: CM Consult Post Acute Care Choice: Home Health, Durable Medical Equipment Living arrangements for the past 2 months: Single Family Home                 DME Arranged: 3-N-1, Walker rolling with seat DME Agency: [facility] Date DME Agency Contacted: 05/03/20 Time DME Agency  Contacted: 67 Representative spoke with at DME Agency: Adela Lank HH Arranged: PT, OT   Date [place] Agency Contacted: 05/03/20      Prior Living Arrangements/Services Living arrangements for the past 2 months: [place] with:: Self, Adult Children Patient language and need for interpreter reviewed:: Yes Do you feel safe going back to the place where you live?: Yes      Need for Family Participation in Patient Care: Yes (Comment) Care giver support system in place?: Yes (comment)   Criminal Activity/Legal Involvement Pertinent to Current Situation/Hospitalization: No - Comment as needed  Activities of Daily Living Home Assistive Devices/Equipment: None ADL Screening (condition at time of admission) Patient's cognitive ability adequate to safely complete daily activities?: Yes Is the patient deaf or have difficulty hearing?: No Does the patient have difficulty seeing, even when wearing glasses/contacts?: No Does the patient have difficulty concentrating, remembering, or making decisions?: No Patient able to express need for assistance with ADLs?: Yes Does the patient have difficulty dressing or bathing?: No Independently performs ADLs?: Yes (appropriate for developmental age) Does the patient have difficulty walking or climbing stairs?: No Weakness of Legs: None Weakness of Arms/Hands: None  Permission Sought/Granted Permission sought to share information with : Facility Art therapist granted to share information with : Yes, Verbal Permission Granted     Permission granted to share info w AGENCY: HH/DME        Emotional  Assessment Appearance:: Appears stated age Attitude/Demeanor/Rapport: Engaged Affect (typically observed): Appropriate Orientation: : Oriented to Self, Oriented to Place, Oriented to  Time, Oriented to Situation Alcohol / Substance Use: Not Applicable Psych Involvement: No (comment)  Admission diagnosis:  Unstable angina (HCC)  [I20.0] Occlusion of proximal portion of left anterior descending (LAD) coronary artery (HCC) [I24.0] S/P CABG x 5 [Z95.1] Patient Active Problem List   Diagnosis Date Noted  . S/P CABG x 5 04/27/2020  . Unstable angina ([place]) 04/25/2020  . Rectal bleeding 04/16/2019  . Hx of total knee arthroplasty, right 04/03/2017  . Other dysphagia 05/30/2016  . New onset a-fib ([place]) 05/18/2016  . Hypokalemia 05/18/2016  . Diabetes ([place]) 05/18/2016  . Atrial fibrillation ([place]) 05/18/2016  . Achalasia 05/15/2016  . Migraine without aura and without status migrainosus, not intractable 05/10/2015  . GERD (gastroesophageal reflux disease) 01/07/2012  . Dysphagia 11/26/2011   PCP:  Glenda Chroman, MD Pharmacy:   [facility], [place] [address] [place] [address] Phone: [phone] Fax: [phone]     Social Determinants of Health (SDOH) Interventions    Readmission Risk Interventions Readmission Risk Prevention Plan 05/03/2020  Transportation Screening Complete  PCP or Specialist Appt within 5-7 Days Complete  Home Care Screening Complete  Medication Review (RN CM) Complete  Some recent data might be hidden

## 2020-05-03 NOTE — Progress Notes (Signed)
      [address]       [place] [address]             [phone]       Called to the bedside for patient complaint of burning in her chest/throat and a "lump in her throat". She has maalox and a PPI ordered. Her vitals are stable at this time. Patient states that she did not eat breakfast this morning and took all her pills around 9am including her PPI. I doubt this has anything to do with her EPW pull.   Recommended trying to eat a few crackers before her breakfast arrives and using maalox PRN. I suspect the burning will subside after eating. Nursing updated and asked to call if any new symptoms arise.   Nicholes Rough, PA-C

## 2020-05-03 NOTE — Progress Notes (Signed)
Mobility Specialist: Progress Note   05/03/20 1709  Mobility  Activity Ambulated in hall  Level of Assistance Minimal assist, patient does 75% or more  Assistive Device Four wheel walker  Distance Ambulated (ft) 350 ft  Mobility Response Tolerated well  Mobility performed by Mobility specialist  Bed Position High-fowlers  $Mobility charge 1 Mobility   Pre-Mobility: 95 HR, 160/59 BP, 95% SpO2 Post-Mobility: 103 HR, 183/63 BP, 95% SpO2  Pt asx during ambulation. I communicated the importance of continued ambulation to the pt with RN present in room. RN present in room during vitals and is aware of high BP.   [facility] Dubberly Mobility Specialist

## 2020-05-03 NOTE — Progress Notes (Signed)
Patient noted to be in afib with rate in the 130's. On-call MD notified. New orders for amiodarone IV received. Will continue to monitor the patient closely.   Gailen Shelter RN

## 2020-05-04 ENCOUNTER — Inpatient Hospital Stay (HOSPITAL_COMMUNITY): Payer: Medicare Other

## 2020-05-04 LAB — URINALYSIS, COMPLETE (UACMP) WITH MICROSCOPIC
Bilirubin Urine: NEGATIVE
Glucose, UA: NEGATIVE mg/dL
Ketones, ur: 20 mg/dL — AB
Nitrite: NEGATIVE
Protein, ur: NEGATIVE mg/dL
Specific Gravity, Urine: 1.013 (ref 1.005–1.030)
pH: 7 (ref 5.0–8.0)

## 2020-05-04 LAB — GLUCOSE, CAPILLARY
Glucose-Capillary: 127 mg/dL — ABNORMAL HIGH (ref 70–99)
Glucose-Capillary: 117 mg/dL — ABNORMAL HIGH (ref 70–99)
Glucose-Capillary: 100 mg/dL — ABNORMAL HIGH (ref 70–99)
Glucose-Capillary: 87 mg/dL (ref 70–99)

## 2020-05-04 MED ORDER — AMIODARONE HCL 200 MG PO TABS
400.0000 mg | ORAL_TABLET | Freq: Two times a day (BID) | ORAL | Status: DC
  Administered 2020-05-04 – 2020-05-05 (×4): 400 mg via ORAL
  Filled 2020-05-04 (×4): qty 2

## 2020-05-04 MED ORDER — SODIUM CHLORIDE 0.9 % IV SOLN
1.0000 g | INTRAVENOUS | Status: DC
  Administered 2020-05-04 – 2020-05-05 (×2): 1 g via INTRAVENOUS
  Filled 2020-05-04: qty 1
  Filled 2020-05-04: qty 10

## 2020-05-04 MED ORDER — BOOST / RESOURCE BREEZE PO LIQD CUSTOM
1.0000 | Freq: Three times a day (TID) | ORAL | Status: DC
  Administered 2020-05-04 – 2020-05-10 (×10): 1 via ORAL

## 2020-05-04 NOTE — TOC Progression Note (Signed)
Transition of Care (TOC) - Progression Note  Valentina Gu, BSN Transitions of Care Unit 4E- RN Case Manager See Treatment Team for direct phone #    Patient Details  Name: Katrina Castro MRN: [id number] Date of Birth: May 10, 1946  [facility]) CM/SW Contact  Dahlia Client Romeo Rabon, RN Phone Number: 05/04/2020, 11:52 AM  Clinical Narrative:    Follow up done on [facility] referral with [facility], per Junie Panning [facility] will accept referral this AM for [facility] needs in [facility] for HHPT/OT-  TOC will follow for any further transition of care needs   Expected Discharge Plan: [place] Barriers to Discharge: Continued Medical Work up  Expected Discharge Plan and Services Expected Discharge Plan: [place]   Discharge Planning Services: CM Consult Post Acute Care Choice: Home Health, Durable Medical Equipment Living arrangements for the past 2 months: Single Family Home                 DME Arranged: 3-N-1, Walker rolling with seat DME Agency: [employer] Date DME Agency Contacted: 05/03/20 Time DME Agency Contacted: 79 Representative spoke with at DME Agency: [place]: PT, OT [place] Agency: [place] ([place]) Date [place]: 05/04/20 Time [place]: 1130 Representative spoke with at [place]: [place] Interventions    Readmission Risk Interventions Readmission Risk Prevention Plan 05/03/2020  Transportation Screening Complete  PCP or Specialist Appt within 5-7 Days Complete  Home Care Screening Complete  Medication Review (RN CM) Complete  Some recent data might be hidden

## 2020-05-04 NOTE — Discharge Summary (Signed)
[address]       [place] [address]             [phone]      Physician Discharge Summary  Patient ID: Katrina Castro MRN: [id number] DOB/AGE: 12-15-1945 74 y.o.  Admit date: 04/25/2020 Discharge date: 05/10/2020  Admission Diagnoses:  Patient Active Problem List   Diagnosis Date Noted  . Unstable angina ([place]) 04/25/2020  . Rectal bleeding 04/16/2019  . Hx of total knee arthroplasty, right 04/03/2017  . Other dysphagia 05/30/2016  . New onset a-fib ([place]) 05/18/2016  . Hypokalemia 05/18/2016  . Diabetes ([place]) 05/18/2016  . Atrial fibrillation ([place]) 05/18/2016  . Achalasia 05/15/2016  . Migraine without aura and without status migrainosus, not intractable 05/10/2015  . GERD (gastroesophageal reflux disease) 01/07/2012  . Dysphagia 11/26/2011    Discharge Diagnoses:  Active Problems:   Unstable angina (HCC)   S/P CABG x 5   Discharged Condition: good  History of Present Illness:      This is a 74 year old female with a past medical history of paroxsymal atrial fibrillation, type II diabetes mellitus, hypertension, esophageal dysmotility who presented to [facility] with complaints of chest pain on 04/25/2020. Per patient, she has chest pain mostly with exertion, it sometimes radiates to her left shoulder, she sometimes has left arm numbness. She also has shortness of breath and diaphoresis with her chest pain. She had an echo done 03/17/2020 that showed LV EF 55-60%, mild MV regurgitation, no pericardial effusion. She then on 04/22/2000 had a CCTA that showed occluded proximal LAD and ostial portion of D1.  She underwent a cardiac catheterization today which showed proximal to mid LAD 75% stenosis, first Diagonal 90% stenosis, OM1 with 95% stenosis, RPDA 90% stenosed, and first RPL with 90% stenosis. Dr. Orvan Seen has been consulted for the consideration of coronary artery bypass grafting surgery. Patient's vital signs are stable at the time of my  exam. She recently had an episode of chest pain and felt anxious. She is emotional this afternoon.  Hospital Course:  On 04/27/2020 Ms. Katrina Castro underwent a Coronary Artery Bypass Grafting x 5 with Dr. Orvan Seen. She tolerated the procedure well and was transferred to the surgical ICU in stable condition. She remained intubated POD 1. She was neurologically intact. She was extubaed POD 2. We began to mobilize the patient and initiated a diuretic regimen for fluid overload. Critical care was following along with [place]. She was off pressor support and ambulating. We consulted PT/OT for mobilization.  She is maintaining normal sinus rhythm on aspirin and beta-blocker.  She has required diuresis for volume overload.  Blood sugars have been stable and she has been resumed on her preoperative Amaryl and Metformin.  She did have a leukocytosis which is being monitored.  Most recent white blood cell count on 04/30/2020 is 20.1.  Central line and Foley catheter have been removed.  She does have a mild expected acute blood loss anemia which is stable.  Renal function has remained within normal limits.  She has had some difficulty with diarrhea.  Laxatives have been discontinued and she has been started on Imodium.  A gastrointestinal PCR panel has been ordered.Her blood pressure remained more stable on the decreased dose of Isordil. We increased her metoprolol to 50mg  BID due to a few brief episodes of atrial fibrillation overnight. Her BP had been slowly rising which we continued to trend. Her epicardial pacing wires were removed. She  had an episode of atrial fibrillation which she has had in the past. She was started on iv Amio. She was transitioned to PO Amio on 11/17. She has a positive result for a UTI therefore she was started on Rocephin. Her culture was pending and we continued to follow. She grew pseudomonas on her urine culture so her antibiotic was switched to Maxipime. She will require 5 days of treatment IV  since there is no oral alternative. She continued to have several bouts of diarrhea per day and therefore we got GI involved for further evaluation. Their initial impression was that the patient was having a recurrence of C. difficile infections since she had had this in the past. GI service recommended stopping the Lomotil and Imodium. C. difficile antigen test was ordered. However, the patient did not have any further loose stools over the next 48 hours so this sample was not collected. Her last dose of antibiotics was 11/22 at 22:00. Therefore, discharge took place the following morning. She was ambulating with limited assistance, incontinence issues continued and she was encouraged to reach out to a urologist if needed, she was off oxygen with good oxygen saturation, and she was ready for discharge home. Her metformin was stopped due to diarrhea, her blood glucose has been mostly controlled. She is encouraged to continue monitoring at home and make an appointment with her diabetes specialist for follow-up.     Consults: GI  Significant Diagnostic Studies:  Left Main  Ost LM lesion is 25% stenosed.  Left Anterior Descending  Prox LAD to Mid LAD lesion is 75% stenosed. The lesion is severely calcified.  First Diagonal Branch  1st Diag lesion is 90% stenosed.  Left Circumflex  Ost Cx to Prox Cx lesion is 80% stenosed.  First Obtuse Marginal Branch  1st Mrg lesion is 95% stenosed.  Second Obtuse Marginal Branch  Vessel is small in size.  Right Coronary Artery  Right Posterior Descending Artery  RPDA lesion is 90% stenosed.  First Right Posterolateral Branch  1st RPL lesion is 90% stenosed.  Intervention  No interventions have been documented. Wall Motion  Resting               Left Heart  Left Ventricle The left ventricular size is normal. The left ventricular systolic function is normal. LV end diastolic pressure is normal. The left ventricular ejection fraction is 55-65% by  visual estimate. No regional wall motion abnormalities.  Coronary Diagrams  Diagnostic Dominance: Right  Intervention     Treatments:  CARDIOTHORACIC SURGERY OPERATIVE NOTE   Date of Procedure:    04/27/2020   Preoperative Diagnosis:      Severe 3-vessel Coronary Artery Disease with unstable angina   Postoperative Diagnosis:    Same   Procedure:         Coronary Artery Bypass Grafting x 5             Left Internal Mammary Artery to Distal Left Anterior Descending Coronary Artery; Saphenous Vein Graft to right posterior Descending Coronary Artery and posterior lateral artery as a sequenced graft; left radial artery graft to ramus intermedius and first obtuse Marginal Branch of Left Circumflex Coronary Artery as a sequenced graft; Endoscopic Vein Harvest from right thigh Open left radial artery harvesting Completion graft surveillance with indocyanine green fluorescence imaging     Surgeon:        B.  Murvin Natal, MD   Assistant:       T.  Harriet Pho, PA-C  Anesthesia:    General   Operative Findings: ? Preserved left ventricular systolic function ? Good quality left internal mammary artery conduit ? Good quality saphenous vein conduit and left radial artery conduit ? Good quality target vessels for grafting; duplicated LAD system       BRIEF CLINICAL NOTE AND INDICATIONS FOR SURGERY   74 year old lady experienced exertional pain recently.  This was assessed with a CT coronary angiogram which demonstrated or suggested diffuse CAD.  This was confirmed with left heart catheterization.  She presents for CABG having undergone a thorough screening examination  Discharge Exam: Blood pressure (!) 121/57, pulse 67, temperature 98.3 F (36.8 C), temperature source Oral, resp. rate 19, height 5\' 3"  (1.6 m), weight 64.6 kg, SpO2 99 %.   General appearance: alert, cooperative and no distress Heart: regular rate and rhythm, S1, S2 normal, no murmur, click, rub or gallop Lungs:  clear to auscultation bilaterally Abdomen: soft, non-tender; bowel sounds normal; no masses,  no organomegaly Extremities: extremities normal, atraumatic, no cyanosis or edema Wound: clean and dry   Disposition: Discharge disposition: 01-Home or Self Care       Discharge Instructions    Ambulatory referral to Nutrition and Diabetic Education   Complete by: As directed    Remove staples   Complete by: As directed      Allergies as of 05/10/2020      Reactions   Levofloxacin Anaphylaxis, Rash   Cardizem [diltiazem] Other (See Comments)   Patient states that she could not think, felt that she was in the twilight zone. Arm discomfort.   Sulfa Antibiotics Itching   Diazepam Nausea Only   Lipitor [atorvastatin] Other (See Comments)   Caused her body to be "out of whack"  - elevated sugar also.      Medication List    STOP taking these medications   acyclovir 400 MG tablet Commonly known as: ZOVIRAX   docusate sodium 100 MG capsule Commonly known as: Colace   levocetirizine 5 MG tablet Commonly known as: XYZAL   metFORMIN 500 MG tablet Commonly known as: GLUCOPHAGE   rivaroxaban 20 MG Tabs tablet Commonly known as: XARELTO     TAKE these medications   acetaminophen 325 MG tablet Commonly known as: TYLENOL Take 2 tablets (650 mg total) by mouth every 6 (six) hours as needed for mild pain.   ALPRAZolam 0.5 MG tablet Commonly known as: XANAX Take 0.25 mg by mouth at bedtime as needed for anxiety.   amiodarone 200 MG tablet Commonly known as: PACERONE Take 1 tablet (200 mg total) by mouth daily.   amoxicillin 500 MG capsule Commonly known as: AMOXIL Take 1,000 mg by mouth 2 (two) times daily. Before dental visits   aspirin 81 MG EC tablet Take 1 tablet (81 mg total) by mouth daily. Swallow whole.   cetirizine 10 MG tablet Commonly known as: ZYRTEC Take 10 mg by mouth daily.   clopidogrel 75 MG tablet Commonly known as: PLAVIX Take 1 tablet (75 mg  total) by mouth daily.   glimepiride 2 MG tablet Commonly known as: AMARYL Take 2 mg by mouth daily with breakfast.   isosorbide dinitrate 5 MG tablet Commonly known as: ISORDIL Take 1 tablet (5 mg total) by mouth 3 (three) times daily. What changed:   medication strength  how much to take  when to take this   metoprolol tartrate 50 MG tablet Commonly known as: LOPRESSOR Take 50 mg by mouth 2 (two) times daily.   oxybutynin  10 MG 24 hr tablet Commonly known as: DITROPAN-XL Take 10 mg by mouth daily.   pantoprazole 40 MG tablet Commonly known as: PROTONIX Take 40 mg by mouth daily.   rosuvastatin 40 MG tablet Commonly known as: CRESTOR Take 1 tablet (40 mg total) by mouth daily. What changed:   medication strength  how much to take   traMADol 50 MG tablet Commonly known as: ULTRAM Take 1 tablet (50 mg total) by mouth every 8 (eight) hours as needed for moderate pain.   Zinc Oxide 12.8 % ointment Commonly known as: TRIPLE PASTE Apply topically as needed for irritation.            Durable Medical Equipment  (From admission, onward)         Start     Ordered   05/03/20 1612  For home use only DME 3 n 1  Once        05/03/20 1611   05/03/20 1612  For home use only DME 4 wheeled rolling walker with seat  Once       Comments: Post op  Question:  Patient needs a walker to treat with the following condition  Answer:  Weakness   05/03/20 1611          Follow-up Information    Verta Ellen., NP Follow up.   Specialty: Cardiology Why: Levell July, PA-C 11/29 @2pm  [facility])  Contact information: [place] [address] [phone]        Glenda Chroman, MD. Call in 1 day(s).   Specialty: Internal Medicine Contact information: [place] [address] [phone]        Fay Records, MD .   Specialty: Cardiology Contact information: [address] [address] [phone]         [name], MD Follow up.   Specialty: Cardiothoracic Surgery Why: Your routine follow-up appointment is on 11/29 at 11:00am. Please arrive at 10:30am for a chest xray which is located at [facility] which is on the first floor of our building Contact information: [address] [place] [address] [place], [facility] Follow up.   Why: rollator and 3n1 arranged- to be delivered to room prior to discharge Contact information: [place] [address] [phone]        [facility] Follow up.   Specialty: Home Health Services Why: HHPT/OT arranged- they will contact you on discharge to schedule home visits             The patient has been discharged on:   1.Beta Blocker:  Yes [ yes  ]                              No   [   ]                              If No, reason:  2.Ace Inhibitor/ARB: Yes [   ]                                     No  [  no  ]  If No, reason: hypotension at times  3.Statin:   Yes [  yes ]                  No  [   ]                  If No, reason:  4.Ecasa:  Yes  [  yes ]                  No   [   ]                  If No, reason:    Signed: Elgie Collard 05/10/2020, 8:18 AM

## 2020-05-04 NOTE — Progress Notes (Addendum)
      [address]       [place] [address]             [phone]      7 Days Post-Op Procedure(s) (LRB): CORONARY ARTERY BYPASS GRAFTING (CABG) TIMES FIVE USING LEFT INTERNAL MAMMARY ARTERY, LEFT HARVESTED RADIAL ARTERY, RIGHT GREATER SAPHENOUS VEIN HARVESTED ENDOSCOPICALLY. (N/A) LEFT RADIAL ARTERY HARVEST (Left) TRANSESOPHAGEAL ECHOCARDIOGRAM (TEE) (N/A) INDOCYANINE GREEN FLUORESCENCE IMAGING (ICG) (N/A) Subjective: Feels okay this morning. Pain has been well controlled.   Objective: Vital signs in last 24 hours: Temp:  [97.5 F (36.4 C)-98.2 F (36.8 C)] 97.7 F (36.5 C) (11/17 0400) Pulse Rate:  [69-96] 78 (11/17 0400) Cardiac Rhythm: Atrial fibrillation (11/16 1900) Resp:  [16-20] 17 (11/17 0400) BP: (115-159)/(63-81) 154/65 (11/17 0400) SpO2:  [95 %-100 %] 96 % (11/17 0400) Weight:  [65.7 kg] 65.7 kg (11/17 0500)     Intake/Output from previous day: 11/16 0701 - 11/17 0700 In: 240 [P.O.:240] Out: -  Intake/Output this shift: No intake/output data recorded.  General appearance: alert, cooperative and no distress Heart: regular rate and rhythm, S1, S2 normal, no murmur, click, rub or gallop Lungs: clear to auscultation bilaterally Abdomen: soft, non-tender; bowel sounds normal; no masses,  no organomegaly Extremities: extremities normal, atraumatic, no cyanosis or edema Wound: clean and dry  Lab Results: Recent Labs    05/01/20 1122 05/02/20 0354  WBC 15.7* 12.8*  HGB 10.9* 10.3*  HCT 34.5* 32.0*  PLT 303 210   BMET:  Recent Labs    05/01/20 1122 05/02/20 0354  NA 137 137  K 4.0 3.8  CL 103 104  CO2 24 23  GLUCOSE 190* 130*  BUN 21 13  CREATININE 0.72 0.64  CALCIUM 8.5* 8.4*    PT/INR: No results for input(s): LABPROT, INR in the last 72 hours. ABG    Component Value Date/Time   PHART 7.375 04/28/2020 1013   HCO3 22.9 04/28/2020 1013   TCO2 24 04/28/2020 1013   ACIDBASEDEF 2.0 04/28/2020 1013   O2SAT 99.0 04/28/2020  1013   CBG (last 3)  Recent Labs    05/03/20 1707 05/03/20 2107 05/04/20 0623  GLUCAP 138* 147* 127*    Assessment/Plan: S/P Procedure(s) (LRB): CORONARY ARTERY BYPASS GRAFTING (CABG) TIMES FIVE USING LEFT INTERNAL MAMMARY ARTERY, LEFT HARVESTED RADIAL ARTERY, RIGHT GREATER SAPHENOUS VEIN HARVESTED ENDOSCOPICALLY. (N/A) LEFT RADIAL ARTERY HARVEST (Left) TRANSESOPHAGEAL ECHOCARDIOGRAM (TEE) (N/A) INDOCYANINE GREEN FLUORESCENCE IMAGING (ICG) (N/A)  1.CV- atrial fibrillation last night, she states she had a hx of afib.  Amio started IV, no in NSR.  BP has been stable Continue lopressor 50mg  BID.  2. Pulm- tolerating room air with good oxygen saturation. CXR stable with tiny left pleural effusion. 3. Renal-0.64, electrolytes okay, no new labs 4. ID- positive UA, culture pending. Start Rocephin and will d/c if culture unremarkable. She does have several abx allergies 5. Endo- blood glucose well controlled 6. H and H stable, 10.3/32.0 stable 7. GI- diarrhea with Glucerna, will try boost breeze this morning with close monitoring of blood glucose. No eating much at this point. GERD symptoms-better this morning but still present. Nausea improved.  Plan: Watch urine culture closely. Will transition her Amio to oral and d/c drip 2 hours after. Encouraged use of incentive spirometer. Encourage ambulation in the halls. Spoke with daughter [name] this morning about the plan for the day.    LOS: 9 days    [facility] 05/04/2020

## 2020-05-04 NOTE — Discharge Instructions (Signed)
Discharge Instructions:  1. You may shower, please wash incisions daily with soap and water and keep dry.  If you wish to cover wounds with dressing you may do so but please keep clean and change daily.  No tub baths or swimming until incisions have completely healed.  If your incisions become red or develop any drainage please call our office at [phone]  2. No Driving until cleared by Dr. Orvan Seen office and you are no longer using narcotic pain medications  3. Monitor your weight daily.. Please use the same scale and weigh at same time... If you gain 3-5 lbs in 48 hours with associated lower extremity swelling, please contact our office at [phone]  4. Fever of 101.5 for at least 24 hours with no source, please contact our office at [phone]  5. Activity- up as tolerated, please walk at least 3 times per day.  Avoid strenuous activity, no lifting, pushing, or pulling with your arms over 8-10 lbs for a minimum of 6 weeks  6. If any questions or concerns arise, please do not hesitate to contact our office at [phone]

## 2020-05-04 NOTE — Progress Notes (Signed)
Mobility Specialist: Progress Note   05/04/20 1634  Mobility  Activity Ambulated in hall  Level of Assistance Modified independent, requires aide device or extra time  Assistive Device Four wheel walker  Distance Ambulated (ft) 350 ft  Mobility Response Tolerated well  Mobility performed by Mobility specialist  Bed Position Semi-fowlers  $Mobility charge 1 Mobility   Pre-Mobility: 71 HR, 135/65 BP, 94% SpO2 Post-Mobility: 75 HR, 117/64 BP, 96% SpO2  Pt was asx during ambulation. This was pt's third walk of the [facility].   [facility] Mobility Specialist

## 2020-05-04 NOTE — Progress Notes (Signed)
Occupational Therapy Treatment Patient Details Name: Katrina [facility] MRN: [id number] DOB: Sep 27, 1945 Today's Date: 05/04/2020    History of present illness Pt is a 74 y.o. female admitted and s/p CABG x5 on 04/27/20 due to recent unstable angina with cath findings of multivessel disease. PMH includes DM, depression, arthritis, HTN, R TKA (2018).   OT comments  Patient and daughter in the room.  OT reviewed all sternal precautions.  Educated on DME for bathing and dressing.  Daughter is ordering a shower seat and patient has a suction cup grab bar at home.  Patient stating she is able to bathe and dress herself with setup and supervision as needed.  Patient able to perform light grooming task sink side, and is able to mobilize to the toilet with 4WRW.  Patient anticipates discharging home with initial 24 hour SBA from family and [facility] services.  All further needs can be met at the next level of care.  Patient is walking the halls multiple times each day.    Follow Up Recommendations  Home health OT;Supervision/Assistance - 24 hour    Equipment Recommendations  Tub/shower seat    Recommendations for Other Services      Precautions / Restrictions Precautions Precautions: Sternal;Fall Precaution Comments: Pacing wires, sternal wound vac, reviewed sternal precautions and provided handout Restrictions Weight Bearing Restrictions: No       Mobility Bed Mobility Overal bed mobility: Needs Assistance Bed Mobility: Sidelying to Sit;Sit to Sidelying Rolling: Supervision Sidelying to sit: Supervision     Sit to sidelying: Supervision General bed mobility comments: Pt required mod VCs to keep her arms in "the tube" during back to bed transfers.  Transfers Overall transfer level: Needs assistance Equipment used: 4-wheeled walker Transfers: Sit to/from Stand Sit to Stand: Min guard Stand pivot transfers: Min guard       General transfer comment: Pt performed with poor hand placement  despite cues to push from B knees to stay in "The Tube".  Pt required cues for locking and unlocking rollator brakes but remains internally distracted so little to no carryover with this safety technique during session.    Balance Overall balance assessment: Needs assistance Sitting-balance support: No upper extremity supported;Feet supported Sitting balance-Leahy Scale: Good Sitting balance - Comments: Pt able to maintain static sitting balance EOB with supervision.   Standing balance support: Bilateral upper extremity supported Standing balance-Leahy Scale: Poor Standing balance comment: B UE on RW for static standing, with unsteadiness noted but no overt LOB.                           ADL either performed or assessed with clinical judgement   ADL       Grooming: Wash/dry hands;Wash/dry face;Standing;Supervision/safety               Lower Body Dressing: Supervision/safety;Sitting/lateral leans   Toilet Transfer: Supervision/safety;RW;Ambulation                                     Cognition Arousal/Alertness: Awake/alert Behavior During Therapy: Flat affect Overall Cognitive Status: Within Functional Limits for tasks assessed                                 General Comments: Pt reports she does not feel good and required education and encouragement from her daughter  and PTA to participate in PT session.        Exercises Other Exercises Other Exercises: IS x 10 reps.  252m~ x 10 reps.  Cues for technique but noted with poor inspiration.   Shoulder Instructions       General Comments      Pertinent Vitals/ Pain       Pain Assessment: Faces Faces Pain Scale: Hurts a little bit Pain Location: Generalized, sternal incision Pain Descriptors / Indicators: Guarding Pain Intervention(s): Monitored during session   Progress Toward Goals  OT Goals(current goals can now be found in the care plan section)  Progress towards OT  goals: Goals met/education completed, patient discharged from OT  Acute Rehab OT Goals Patient Stated Goal: I'm hoping to get back home on Friday. OT Goal Formulation: With patient Time For Goal Achievement: 05/14/20  Plan Discharge plan remains appropriate    Co-evaluation                 AM-PAC OT "6 Clicks" Daily Activity     Outcome Measure   Help from another person eating meals?: None Help from another person taking care of personal grooming?: None Help from another person toileting, which includes using toliet, bedpan, or urinal?: A Little Help from another person bathing (including washing, rinsing, drying)?: A Little Help from another person to put on and taking off regular upper body clothing?: A Little Help from another person to put on and taking off regular lower body clothing?: A Little 6 Click Score: 20    End of Session Equipment Utilized During Treatment: Rolling walker  OT Visit Diagnosis: Unsteadiness on feet (R26.81);Muscle weakness (generalized) (M62.81);Pain   Activity Tolerance Patient limited by fatigue   Patient Left in bed;with call bell/phone within reach;with family/visitor present   Nurse Communication Mobility status        Time: 158[address]-62[address]OT Time Calculation (min): 13 min  Charges: OT General Charges $OT Visit: 1 Visit OT Treatments $Self Care/Home Management : 8-22 mins  05/04/2020  Rich, OTR/L  Acute Rehabilitation Services  Office:  [place]11/17/2021, 3:25 PM

## 2020-05-04 NOTE — Progress Notes (Signed)
Physical Therapy Treatment Patient Details Name: Katrina Castro MRN: [id number] DOB: [birth year] Today's Date: 05/04/2020    History of Present Illness Pt is a 74 y.o. female admitted and s/p CABG x5 on 04/27/20 due to recent unstable angina with cath findings of multivessel disease. PMH includes DM, depression, arthritis, HTN, R TKA (2018).    PT Comments    Pt seated on commode on arrival.  Pt reports," Not feeling well." but agreeable to PT session.  She was able to negotiate x 3 stairs with minor balance impairment.  Plan for HHPT with DME listed below.  Her use of IS remains limited and required education on use, technique and frequency. Will continue to follow during acute hospitalization.     Follow Up Recommendations  Home health PT;Supervision for mobility/OOB     Equipment Recommendations  3in1 (PT);Other (comment) (rollator.)    Recommendations for Other Services       Precautions / Restrictions Precautions Precautions: Sternal;Fall;Other (comment) Precaution Comments: Pacing wires, sternal wound vac, reviewed sternal precautions and provided handout Restrictions Weight Bearing Restrictions: No    Mobility  Bed Mobility Overal bed mobility: Needs Assistance Bed Mobility: Sit to Sidelying;Rolling Rolling: Supervision       Sit to sidelying: Min guard General bed mobility comments: Pt required mod VCs to keep her arms in "the tube" during back to bed transfers.  Transfers Overall transfer level: Needs assistance Equipment used: 4-wheeled walker Transfers: Sit to/from Stand   Stand pivot transfers: Min guard       General transfer comment: Pt performed with poor hand placement despite cues to push from B knees to stay in "The Tube".  Pt required cues for locking and unlocking rollator brakes but remains internally distracted so little to no carryover with this safety technique during session.  Ambulation/Gait Ambulation/Gait assistance: Supervision Gait  Distance (Feet): 160 Feet Assistive device: 4-wheeled walker       General Gait Details: Cues for pacing, posture and Rollator safety.  Pt taking hand off rollator several times to adjust her mask.   Stairs Stairs: Yes Stairs assistance: Min guard Stair Management: Step to pattern Number of Stairs: 3 General stair comments: Pt had no issued ascending but minor LOB descending but able to self correct.  Min guard for safety.   Wheelchair Mobility    Modified Rankin (Stroke Patients Only)       Balance Overall balance assessment: Needs assistance Sitting-balance support: No upper extremity supported;Feet supported Sitting balance-Leahy Scale: Good Sitting balance - Comments: Pt able to maintain static sitting balance EOB with supervision.     Standing balance-Leahy Scale: Poor Standing balance comment: B UE on RW for static standing, with unsteadiness noted but no overt LOB.                            Cognition Arousal/Alertness: Awake/alert Behavior During Therapy: Anxious;Agitated Overall Cognitive Status: Within Functional Limits for tasks assessed                                 General Comments: Pt reports she does not feel good and required education and encouragement from her daughter and PTA to participate in PT session.      Exercises Other Exercises Other Exercises: IS x 10 reps.  280ml~ x 10 reps.  Cues for technique but noted with poor inspiration.    General Comments  Pertinent Vitals/Pain Pain Assessment: Faces Faces Pain Scale: Hurts a little bit Pain Location: Generalized, sternal incision ( also burning when she urinates ) Pain Descriptors / Indicators: Guarding;Sore Pain Intervention(s): Monitored during session;Repositioned    Home Living                      Prior Function            PT Goals (current goals can now be found in the care plan section) Acute Rehab PT Goals Patient Stated Goal: to  get better Potential to Achieve Goals: Good Progress towards PT goals: Progressing toward goals    Frequency    Min 3X/week      PT Plan Current plan remains appropriate    Co-evaluation              AM-PAC PT "6 Clicks" Mobility   Outcome Measure  Help needed turning from your back to your side while in a flat bed without using bedrails?: A Little Help needed moving from lying on your back to sitting on the side of a flat bed without using bedrails?: A Little Help needed moving to and from a bed to a chair (including a wheelchair)?: A Little Help needed standing up from a chair using your arms (e.g., wheelchair or bedside chair)?: A Little Help needed to walk in hospital room?: A Little Help needed climbing 3-5 steps with a railing? : A Little 6 Click Score: 18    End of Session Equipment Utilized During Treatment: Gait belt Activity Tolerance: Patient limited by fatigue (and " not feeling well.")   Nurse Communication: Mobility status PT Visit Diagnosis: Other abnormalities of gait and mobility (R26.89);Muscle weakness (generalized) (M62.81);Unsteadiness on feet (R26.81)     Time: 7939-0300 PT Time Calculation (min) (ACUTE ONLY): 17 min  Charges:  $Gait Training: 8-22 mins                     Erasmo Leventhal , PTA Acute Rehabilitation Services Pager [phone] Office [phone]     [name] 05/04/2020, 2:31 PM

## 2020-05-04 NOTE — Progress Notes (Signed)
CARDIAC REHAB PHASE I   PRE:  Rate/Rhythm: 86 SR    BP: sitting 139/65    SaO2: 95 RA  MODE:  Ambulation: 260 ft   POST:  Rate/Rhythm: 93 SR    BP: sitting 131/74     SaO2: 93 RA  Pt up in recliner, worried about many things. Able to stand and walk with rollator. Generally steady but has moments of swaying. At 200 ft pt staggered and I made her sit to rest as she does not conserve energy well and has to be told to rest. Rest x4 min then ambulated back to room and bed with many verbal cues for lines.  BP stable. Practiced IS, inspired 200 mL, poor mechanics. Gave up after 2 attempts. Encouraged more use today, also x2 more walks and more PO intake. C/o nausea. [id number]  [place], ACSM 05/04/2020 10:30 AM

## 2020-05-04 NOTE — Progress Notes (Signed)
PT Cancellation Note  Patient Details Name: Katrina Castro MRN: [id number] DOB: 09/24/1945   Cancelled Treatment:    Reason Eval/Treat Not Completed: (P) Other (comment) (Pt currently working with cardiac rehab, will f/u per POC.)   Riot Waterworth J Stann Mainland 05/04/2020, 10:04 AM  Erasmo Leventhal , PTA Acute Rehabilitation Services Pager [phone] Office [phone]

## 2020-05-05 LAB — BASIC METABOLIC PANEL
Anion gap: 8 (ref 5–15)
BUN: 12 mg/dL (ref 8–23)
CO2: 24 mmol/L (ref 22–32)
Calcium: 8.5 mg/dL — ABNORMAL LOW (ref 8.9–10.3)
Chloride: 107 mmol/L (ref 98–111)
Creatinine, Ser: 0.67 mg/dL (ref 0.44–1.00)
GFR, Estimated: >60 mL/min (ref >60)
Glucose, Bld: 114 mg/dL — ABNORMAL HIGH (ref 70–99)
Potassium: 3.4 mmol/L — ABNORMAL LOW (ref 3.5–5.1)
Sodium: 139 mmol/L (ref 135–145)

## 2020-05-05 LAB — GLUCOSE, CAPILLARY
Glucose-Capillary: 122 mg/dL — ABNORMAL HIGH (ref 70–99)
Glucose-Capillary: 126 mg/dL — ABNORMAL HIGH (ref 70–99)
Glucose-Capillary: 162 mg/dL — ABNORMAL HIGH (ref 70–99)
Glucose-Capillary: 67 mg/dL — ABNORMAL LOW (ref 70–99)
Glucose-Capillary: 72 mg/dL (ref 70–99)

## 2020-05-05 LAB — CBC
HCT: 33.9 % — ABNORMAL LOW (ref 36.0–46.0)
Hemoglobin: 10.9 g/dL — ABNORMAL LOW (ref 12.0–15.0)
MCH: 27.1 pg (ref 26.0–34.0)
MCHC: 32.2 g/dL (ref 30.0–36.0)
MCV: 84.3 fL (ref 80.0–100.0)
Platelets: 554 10*3/uL — ABNORMAL HIGH (ref 150–400)
RBC: 4.02 MIL/uL (ref 3.87–5.11)
RDW: 15.7 % — ABNORMAL HIGH (ref 11.5–15.5)
WBC: 11.8 10*3/uL — ABNORMAL HIGH (ref 4.0–10.5)
nRBC: 0 % (ref 0.0–0.2)

## 2020-05-05 MED ORDER — ZINC OXIDE 12.8 % EX OINT
TOPICAL_OINTMENT | CUTANEOUS | Status: DC | PRN
  Filled 2020-05-05: qty 56.7

## 2020-05-05 MED ORDER — SODIUM CHLORIDE 0.9 % IV SOLN
2.0000 g | Freq: Two times a day (BID) | INTRAVENOUS | Status: AC
  Administered 2020-05-05 – 2020-05-09 (×10): 2 g via INTRAVENOUS
  Filled 2020-05-05 (×11): qty 2

## 2020-05-05 MED ORDER — POTASSIUM CHLORIDE CRYS ER 20 MEQ PO TBCR
30.0000 meq | EXTENDED_RELEASE_TABLET | Freq: Two times a day (BID) | ORAL | Status: AC
  Administered 2020-05-05 (×2): 30 meq via ORAL
  Filled 2020-05-05 (×2): qty 1

## 2020-05-05 MED ORDER — LOPERAMIDE HCL 1 MG/7.5ML PO SUSP
2.0000 mg | ORAL | Status: DC | PRN
  Administered 2020-05-05 – 2020-05-06 (×4): 2 mg via ORAL
  Filled 2020-05-05 (×7): qty 15

## 2020-05-05 MED ORDER — DIPHENOXYLATE-ATROPINE 2.5-0.025 MG PO TABS
1.0000 | ORAL_TABLET | Freq: Once | ORAL | Status: AC
  Administered 2020-05-05: 1 via ORAL
  Filled 2020-05-05: qty 1

## 2020-05-05 NOTE — Progress Notes (Signed)
CARDIAC REHAB PHASE I   Came to ambulate however pt with many c/os, esp diarrhea. To [facility] urgently and assisted with clean up. Pt declined ambulation. Practiced IS x2 and I also gave her flutter valve that was at sink which she practiced. Encouraged her to ambulate with MS later today.  [place], ACSM 05/05/2020 2:09 PM

## 2020-05-05 NOTE — Progress Notes (Signed)
Pharmacy note: 1. Discussed abx change with pt and daughter Tammy via phone to treat UTI (Rocephin doesn't cover PSA).  2. Extensive discussion about diarrhea which has been somewhat chronic PTA. Pt on metformin PTA and pt and daughter unaware this has a high incidence of exacerbating diarrhea.  - d/c metformin and recommended f/u with outpatient MD to change to other diabetes treatment. Daughter Tammy understands.  - Colchicine BID post-op has drug interaction with amiodarone and Glimepiride which increases colchicine levels in the body causing more diarrhea.  3. Discussed both Xyzal and Cetirizine on home med list described as "taking." Daughter [name] was going to mother's home to go through and make another list of her medications. Discussed taking one or the other, not both.  Ms Lesperance would get somewhat confused during out discussion and thought she was going home and could just take her antibiotic home. She wanted her diarrhea fixed immediately. I discussed that this may take a day or two for medication changes to take effect and for her diarrhea to slow. She has gluteal pain from her diarrhea and I can order some diaper rash cream. I assured her that her "pee-pee:" that hurts should hopefully start to feed better today with proper antibiotics.   Katrina Castro, PharmD, BCPS Clinical Staff Pharmacist [email or web address]

## 2020-05-05 NOTE — Progress Notes (Addendum)
[address]       [place] [address]             [phone]      8 Days Post-Op Procedure(s) (LRB): CORONARY ARTERY BYPASS GRAFTING (CABG) TIMES FIVE USING LEFT INTERNAL MAMMARY ARTERY, LEFT HARVESTED RADIAL ARTERY, RIGHT GREATER SAPHENOUS VEIN HARVESTED ENDOSCOPICALLY. (N/A) LEFT RADIAL ARTERY HARVEST (Left) TRANSESOPHAGEAL ECHOCARDIOGRAM (TEE) (N/A) INDOCYANINE GREEN FLUORESCENCE IMAGING (ICG) (N/A) Subjective: Diarrhea this morning. Eating what she can but afraid to eat because she feels its makes her diarrhea worse.   Objective: Vital signs in last 24 hours: Temp:  [97.5 F (36.4 C)-98.1 F (36.7 C)] 98.1 F (36.7 C) (11/18 0343) Pulse Rate:  [59-81] 59 (11/18 0606) Cardiac Rhythm: Normal sinus rhythm (11/17 2031) Resp:  [16-20] 18 (11/18 0606) BP: (117-134)/(59-73) 123/72 (11/18 0343) SpO2:  [70 %-97 %] 70 % (11/18 0606) Weight:  [64.6 kg] 64.6 kg (11/18 0606)     Intake/Output from previous day: No intake/output data recorded. Intake/Output this shift: No intake/output data recorded.  General appearance: alert, cooperative and no distress Heart: regular rate and rhythm, S1, S2 normal, no murmur, click, rub or gallop Lungs: clear to auscultation bilaterally Abdomen: soft, non-tender; bowel sounds normal; no masses,  no organomegaly Extremities: extremities normal, atraumatic, no cyanosis or edema Wound: clean and dry  Lab Results: No results for input(s): WBC, HGB, HCT, PLT in the last 72 hours. BMET: No results for input(s): NA, K, CL, CO2, GLUCOSE, BUN, CREATININE, CALCIUM in the last 72 hours.  PT/INR: No results for input(s): LABPROT, INR in the last 72 hours. ABG    Component Value Date/Time   PHART 7.375 04/28/2020 1013   HCO3 22.9 04/28/2020 1013   TCO2 24 04/28/2020 1013   ACIDBASEDEF 2.0 04/28/2020 1013   O2SAT 99.0 04/28/2020 1013   CBG (last 3)  Recent Labs    05/04/20 1627 05/04/20 2132 05/05/20 0603  GLUCAP 100*  87 122*    Assessment/Plan: S/P Procedure(s) (LRB): CORONARY ARTERY BYPASS GRAFTING (CABG) TIMES FIVE USING LEFT INTERNAL MAMMARY ARTERY, LEFT HARVESTED RADIAL ARTERY, RIGHT GREATER SAPHENOUS VEIN HARVESTED ENDOSCOPICALLY. (N/A) LEFT RADIAL ARTERY HARVEST (Left) TRANSESOPHAGEAL ECHOCARDIOGRAM (TEE) (N/A) INDOCYANINE GREEN FLUORESCENCE IMAGING (ICG) (N/A)  1.CV-hx of atrial fibrillation, now NSR on Amio PO. BP has been stable, Continue lopressor 50mg  BID.  2. Pulm- tolerating room air with good oxygen saturation. CXR stable with tiny left pleural effusion. 3. Renal-0.64, electrolytes okay, no new labs 4. ID- positive UA, culture pending. Start Rocephin and will d/c if culture unremarkable. She does have several abx allergies 5. Endo- blood glucose well controlled 6. H and H stable, no new labs 7. GI- diarrhea, on imodium. She had a GI doctor that she follows with outpatient.  No eating much at this point. GERD symptoms-better this morning but still present. Nausea improved. BRAT diet today  Plan: Continue to work on [employer] and ambulation. Continued diarrhea, imodium ordered. Will get labs today. Hopefully home tomorrow.     LOS: 10 days    Katrina Castro 05/05/2020  Patient examined.  Surgical incisions healing well.  Surgical incisions healing well Still having diarrhea despite stopping colchicine and treating with Imodium.  1 dose of Lomotil ordered this p.m. I reviewed discharge instructions with the patient on wound care activities and diet.  I provided emotional support as she is still very anxious about her health.  Goals for the next 3 days are IV Maxipime for her  Pseudomonas UTI, treat and resolve diarrhea, continue with PT ambulation and prepare for discharge  patient examined and medical record reviewed,agree with above note. Katrina Castro 05/05/2020

## 2020-05-05 NOTE — Progress Notes (Signed)
Mobility Specialist - Progress Note   05/05/20 1543  Mobility  Activity Refused mobility   Informed by RN pt is refusing mobility.  Pricilla Handler Mobility Specialist Mobility Specialist Phone: [phone]

## 2020-05-05 NOTE — Progress Notes (Addendum)
      [address]       [place] [address]             [phone]       Spoke with pharmacy this morning. Urine culture grew out pseudomonas, therefore her antibiotics were changed to Maxipime. IV only available and she will need for 5 days.  Colchicine discontinued. Diarrhea a little better today (only 3 bouts) but still present. Will continue imodium as needed. Tammy (daughter) was updated on the plan of care.   Nicholes Rough, PA-C

## 2020-05-06 LAB — HEPATIC FUNCTION PANEL
ALT: 22 U/L (ref 0–44)
AST: 25 U/L (ref 15–41)
Albumin: 2.5 g/dL — ABNORMAL LOW (ref 3.5–5.0)
Alkaline Phosphatase: 83 U/L (ref 38–126)
Bilirubin, Direct: 0.1 mg/dL (ref 0.0–0.2)
Indirect Bilirubin: 0.3 mg/dL (ref 0.3–0.9)
Total Bilirubin: 0.4 mg/dL (ref 0.3–1.2)
Total Protein: 5.8 g/dL — ABNORMAL LOW (ref 6.5–8.1)

## 2020-05-06 LAB — BASIC METABOLIC PANEL
Anion gap: 10 (ref 5–15)
BUN: 11 mg/dL (ref 8–23)
CO2: 20 mmol/L — ABNORMAL LOW (ref 22–32)
Calcium: 8.6 mg/dL — ABNORMAL LOW (ref 8.9–10.3)
Chloride: 108 mmol/L (ref 98–111)
Creatinine, Ser: 0.65 mg/dL (ref 0.44–1.00)
GFR, Estimated: >60 mL/min (ref >60)
Glucose, Bld: 101 mg/dL — ABNORMAL HIGH (ref 70–99)
Potassium: 4.4 mmol/L (ref 3.5–5.1)
Sodium: 138 mmol/L (ref 135–145)

## 2020-05-06 LAB — AMYLASE: Amylase: 15 U/L — ABNORMAL LOW (ref 28–100)

## 2020-05-06 LAB — GLUCOSE, CAPILLARY
Glucose-Capillary: 101 mg/dL — ABNORMAL HIGH (ref 70–99)
Glucose-Capillary: 151 mg/dL — ABNORMAL HIGH (ref 70–99)
Glucose-Capillary: 63 mg/dL — ABNORMAL LOW (ref 70–99)
Glucose-Capillary: 126 mg/dL — ABNORMAL HIGH (ref 70–99)
Glucose-Capillary: 136 mg/dL — ABNORMAL HIGH (ref 70–99)

## 2020-05-06 LAB — LIPASE, BLOOD: Lipase: 22 U/L (ref 11–51)

## 2020-05-06 LAB — URINE CULTURE: Culture: >=100000 — AB

## 2020-05-06 MED ORDER — AMIODARONE HCL 200 MG PO TABS
200.0000 mg | ORAL_TABLET | Freq: Two times a day (BID) | ORAL | Status: DC
  Administered 2020-05-06: 200 mg via ORAL
  Filled 2020-05-06: qty 1

## 2020-05-06 MED ORDER — DIPHENOXYLATE-ATROPINE 2.5-0.025 MG PO TABS: 1.0000 | ORAL_TABLET | Freq: Four times a day (QID) | ORAL | Status: DC | PRN

## 2020-05-06 MED ORDER — AMIODARONE HCL 200 MG PO TABS
200.0000 mg | ORAL_TABLET | Freq: Every day | ORAL | Status: DC
  Administered 2020-05-07 – 2020-05-10 (×4): 200 mg via ORAL
  Filled 2020-05-06 (×4): qty 1

## 2020-05-06 MED ORDER — GERHARDT'S BUTT CREAM
TOPICAL_CREAM | Freq: Four times a day (QID) | CUTANEOUS | Status: DC
  Administered 2020-05-06 – 2020-05-09 (×4): 1 via TOPICAL
  Filled 2020-05-06: qty 1

## 2020-05-06 NOTE — Progress Notes (Signed)
Mobility Specialist - Progress Note   05/06/20 1501  Mobility  Activity Ambulated in hall  Level of Assistance Standby assist, set-up cues, supervision of patient - no hands on  Assistive Device Four wheel walker  Distance Ambulated (ft) 450 ft  Mobility Response Tolerated well  Mobility performed by Mobility specialist  $Mobility charge 1 Mobility    Pre-mobility: 67 HR During mobility: 75 HR Post-mobility: 69 HR  Pt asx throughout ambulation.   Pricilla Handler Mobility Specialist Mobility Specialist Phone: [phone]

## 2020-05-06 NOTE — Progress Notes (Signed)
Physical Therapy Treatment Patient Details Name: Katrina Castro MRN: [id number] DOB: July 11, 1945 Today's Date: 05/06/2020    History of Present Illness Pt is a 74 y.o. female admitted and s/p CABG x5 on 04/27/20 due to recent unstable angina with cath findings of multivessel disease. PMH includes DM, depression, arthritis, HTN, R TKA (2018).    PT Comments    Patient reports continued severe diahrreah. Reports she went 11 times last night. GI now involved. Patient having stomach discomfort, but is agreeable to bed exercises. She performed rolling and side lying to sit with min assist due to sternal precautions. Patient will continue to benefit from skilled PT while here to improve strength and functional independence for return home.       Follow Up Recommendations  Home health PT;Supervision for mobility/OOB     Equipment Recommendations  3in1 (PT);Other (comment)    Recommendations for Other Services       Precautions / Restrictions Precautions Precautions: Sternal;Fall Restrictions Weight Bearing Restrictions: No    Mobility  Bed Mobility Overal bed mobility: Needs Assistance Bed Mobility: Sidelying to Sit Rolling: Min guard Sidelying to sit: Min assist       General bed mobility comments: Requires min assist for raising trunk to seated position due to sternal precautions.  Transfers                 General transfer comment: not assessed, patient declines at this time  Ambulation/Gait             General Gait Details: patient declines ambulation at this time, due to severe diarreah. She walked earlier with cardiac rehab   Stairs             Wheelchair Mobility    Modified Rankin (Stroke Patients Only)       Balance Overall balance assessment: Needs assistance Sitting-balance support: Feet supported Sitting balance-Leahy Scale: Good Sitting balance - Comments: Pt able to maintain static sitting balance EOB with supervision.                                     Cognition Arousal/Alertness: Awake/alert Behavior During Therapy: WFL for tasks assessed/performed Overall Cognitive Status: Within Functional Limits for tasks assessed                                 General Comments: Patient continues to have severe diarreah. Now has GI involved.      Exercises Other Exercises Other Exercises: BLE strengthening exercises: ap, heel slides, SLR, hip abd x 10 reps    General Comments        Pertinent Vitals/Pain Pain Assessment: Faces Faces Pain Scale: Hurts little more Pain Location: abdomen Pain Descriptors / Indicators: Grimacing;Guarding Pain Intervention(s): Monitored during session;Limited activity within patient's tolerance    Home Living                      Prior Function            PT Goals (current goals can now be found in the care plan section) Acute Rehab PT Goals Patient Stated Goal: to get abdominal issues solved PT Goal Formulation: With patient Time For Goal Achievement: 05/14/20 Potential to Achieve Goals: Good Progress towards PT goals: Progressing toward goals    Frequency    Min 3X/week  PT Plan Current plan remains appropriate    Co-evaluation              AM-PAC PT "6 Clicks" Mobility   Outcome Measure  Help needed turning from your back to your side while in a flat bed without using bedrails?: A Little Help needed moving from lying on your back to sitting on the side of a flat bed without using bedrails?: A Little Help needed moving to and from a bed to a chair (including a wheelchair)?: A Little Help needed standing up from a chair using your arms (e.g., wheelchair or bedside chair)?: A Little Help needed to walk in hospital room?: A Little Help needed climbing 3-5 steps with a railing? : A Little 6 Click Score: 18    End of Session   Activity Tolerance: Patient limited by pain;Other (comment) (diarrhea) Patient left:  in bed;with call bell/phone within reach;with family/visitor present Nurse Communication: Mobility status PT Visit Diagnosis: Muscle weakness (generalized) (M62.81)     Time: 1300-1315 PT Time Calculation (min) (ACUTE ONLY): 15 min  Charges:  $Therapeutic Exercise: 8-22 mins                     Winfrey Chillemi, PT, GCS 05/06/20,1:26 PM

## 2020-05-06 NOTE — Progress Notes (Signed)
Inpatient Diabetes Program Recommendations  AACE/ADA: New Consensus Statement on Inpatient Glycemic Control (2015)  Target Ranges:  Prepandial:   less than 140 mg/dL      Peak postprandial:   less than 180 mg/dL (1-2 hours)      Critically ill patients:  140 - 180 mg/dL   Lab Results  Component Value Date   GLUCAP 151 (H) 05/06/2020   HGBA1C 7.5 (H) 04/25/2020    Review of Glycemic Control Results for BRIANE, BIRDEN (MRN [id number]) as of 05/06/2020 11:44  Ref. Range 05/05/2020 21:23 05/05/2020 23:40 05/06/2020 06:13 05/06/2020 10:48  Glucose-Capillary Latest Ref Range: 70 - 99 mg/dL 67 (L) 126 (H) 101 (H) 151 (H)   Diabetes history: Type 2 DM Outpatient Diabetes medications: Amaryl 2 mg QAM, Metformin 500 mg BID Current orders for Inpatient glycemic control: Novolog 0-24 units TID & HS, Amaryl 2 mg QAM  Noted mild hypoglycemia of 67 mg/dL following 4 units of Novolog. Additionally, patient given Novolog 2 units three hours following previous CBG placing patient at higher risk of hypoglycemia. Please ensure patient given correction within one hour of last CBG.   Thanks, Bronson Curb, MSN, RNC-OB Diabetes Coordinator [phone] (8a-5p)

## 2020-05-06 NOTE — Consult Note (Signed)
Referring Provider:[facility] Primary Care Physician:  Glenda Chroman, MD Primary Gastroenterologist: Althia Forts (Dr. Laural Golden)  Reason for Consultation:  Diarrhea  HPI: Katrina Castro is a 74 y.o. female with CABG x 5 on 11/10, past C. difficile infection (2020) and remote cholecystectomy presenting for consultation of diarrhea.  Patient reports diarrhea which started 1 week ago.  She has been having at least 5-10 liquid stools per day, and up to 14 liquid stools per day per her report.  She also reports rectal irritation as a result.  She had a darker bowel movement today but denies melena or hematochezia.  She has rectal discomfort from persistent diarrhea.  Also reports diffuse abdominal pain.  Reports nausea but denies vomiting.  Denies any dysphagia or GERD.  Denies family history of colon cancer or gastrointestinal malignancy.  Colonoscopy 04/2019: One small polyp in the proximal sigmoid colon. External hemorrhoids.  EGD 05/2016: A single non-bleeding erosion at the gastroesophageal junction. Low-grade of narrowing Schatzki ring. Dilated. Abnormal esophageal motility/spastic distal segment. Injected with botulinum toxin. 3cm HH.  Past Medical History:  Diagnosis Date  . Arthritis   . Concussion    2015  . Depression   . Diabetes ([place])    type 2  . Dysphagia   . Dysrhythmia    afib  . GERD (gastroesophageal reflux disease)   . Headache   . History of bronchitis   . Hypertension   . Seasonal allergies   . Toenail fungus     Past Surgical History:  Procedure Laterality Date  . APPENDECTOMY    . BACK SURGERY     x2  . BOTOX INJECTION N/A 06/06/2016   Procedure: BOTOX INJECTION;  Surgeon: Rogene Houston, MD;  [facility];  Service: Endoscopy;  Laterality: N/A;  . CHOLECYSTECTOMY    . COLONOSCOPY N/A 11/05/2012   Procedure: COLONOSCOPY;  Surgeon: Rogene Houston, MD;  [facility];  Service: Endoscopy;  Laterality: N/A;  1030  . COLONOSCOPY WITH PROPOFOL  N/A 05/01/2019   Procedure: COLONOSCOPY WITH PROPOFOL;  Surgeon: Rogene Houston, MD;  [facility];  Service: Endoscopy;  Laterality: N/A;  11:20am  . CORONARY ARTERY BYPASS GRAFT N/A 04/27/2020   Procedure: CORONARY ARTERY BYPASS GRAFTING (CABG) TIMES FIVE USING LEFT INTERNAL MAMMARY ARTERY, LEFT HARVESTED RADIAL ARTERY, RIGHT GREATER SAPHENOUS VEIN HARVESTED ENDOSCOPICALLY.;  Surgeon: [name], MD;  Location: [facility];  Service: Open Heart Surgery;  Laterality: N/A;  . ESOPHAGEAL DILATION N/A 07/06/2015   Procedure: ESOPHAGEAL DILATION;  Surgeon: Rogene Houston, MD;  [facility];  Service: Endoscopy;  Laterality: N/A;  . ESOPHAGEAL DILATION N/A 06/06/2016   Procedure: ESOPHAGEAL DILATION;  Surgeon: Rogene Houston, MD;  [facility];  Service: Endoscopy;  Laterality: N/A;  . ESOPHAGOGASTRODUODENOSCOPY N/A 02/12/2014   Procedure: ESOPHAGOGASTRODUODENOSCOPY (EGD);  Surgeon: Rogene Houston, MD;  [facility];  Service: Endoscopy;  Laterality: N/A;  150  . ESOPHAGOGASTRODUODENOSCOPY N/A 07/06/2015   Procedure: ESOPHAGOGASTRODUODENOSCOPY (EGD);  Surgeon: Rogene Houston, MD;  [facility];  Service: Endoscopy;  Laterality: N/A;  1:25 - moved to 1/18 @ 10:30 - Ann to notify pt  . ESOPHAGOGASTRODUODENOSCOPY (EGD) WITH ESOPHAGEAL DILATION N/A 07/25/2012   Procedure: ESOPHAGOGASTRODUODENOSCOPY (EGD) WITH ESOPHAGEAL DILATION;  Surgeon: Rogene Houston, MD;  [facility];  Service: Endoscopy;  Laterality: N/A;  325-rescheduled to [place] notified pt  . ESOPHAGOGASTRODUODENOSCOPY (EGD) WITH PROPOFOL N/A 06/06/2016   Procedure:  ESOPHAGOGASTRODUODENOSCOPY (EGD) WITH PROPOFOL;  Surgeon: Rogene Houston, MD;  [facility];  Service: Endoscopy;  Laterality: N/A;  . EYE SURGERY     cataracts removed  . Foot surgeries Bilateral    hammer toes  . LEFT HEART CATH AND CORONARY ANGIOGRAPHY N/A 04/26/2020   Procedure: LEFT HEART CATH  AND CORONARY ANGIOGRAPHY;  Surgeon: Martinique, Peter M, MD;  Location: [facility];  Service: Cardiovascular;  Laterality: N/A;  . Venia Minks DILATION N/A 02/12/2014   Procedure: Venia Minks DILATION;  Surgeon: Rogene Houston, MD;  [facility];  Service: Endoscopy;  Laterality: N/A;  . POLYPECTOMY  05/01/2019   Procedure: POLYPECTOMY;  Surgeon: Rogene Houston, MD;  [facility];  Service: Endoscopy;;  colon   . RADIAL ARTERY HARVEST Left 04/27/2020   Procedure: LEFT RADIAL ARTERY HARVEST;  Surgeon: [name], MD;  Location: [facility];  Service: Open Heart Surgery;  Laterality: Left;  . Right knee arthroscopy     x2  . TEE WITHOUT CARDIOVERSION N/A 04/27/2020   Procedure: TRANSESOPHAGEAL ECHOCARDIOGRAM (TEE);  Surgeon: [name], MD;  Location: [facility];  Service: Open Heart Surgery;  Laterality: N/A;  . TONSILLECTOMY    . TOTAL ABDOMINAL HYSTERECTOMY    . TOTAL KNEE ARTHROPLASTY Right 04/03/2017   Procedure: RIGHT TOTAL KNEE ARTHROPLASTY;  Surgeon: Latanya Maudlin, MD;  Location: [facility];  Service: Orthopedics;  Laterality: Right;    Prior to Admission medications   Medication Sig Start Date End Date Taking? Authorizing Provider  acyclovir (ZOVIRAX) 400 MG tablet Take 400 mg by mouth daily. 11/02/19  Yes [provider]  ALPRAZolam Duanne Moron) 0.5 MG tablet Take 0.25 mg by mouth at bedtime as needed for anxiety.  04/01/19  Yes [provider]  cetirizine (ZYRTEC) 10 MG tablet Take 10 mg by mouth daily.   Yes [provider]  glimepiride (AMARYL) 2 MG tablet Take 2 mg by mouth daily with breakfast.    Yes [provider]  isosorbide dinitrate (ISORDIL) 10 MG tablet Take 1 tablet (10 mg total) by mouth 2 (two) times daily. 03/15/20  Yes Verta Ellen., NP  levocetirizine (XYZAL) 5 MG tablet Take 5 mg by mouth daily.    Yes [provider]  metFORMIN (GLUCOPHAGE) 500 MG tablet Take 500 mg by mouth 2 (two) times daily  with a meal.  04/15/19  Yes [provider]  metoprolol (LOPRESSOR) 50 MG tablet Take 50 mg by mouth 2 (two) times daily.    Yes [provider]  oxybutynin (DITROPAN-XL) 10 MG 24 hr tablet Take 10 mg by mouth daily.   Yes [provider]  pantoprazole (PROTONIX) 40 MG tablet Take 40 mg by mouth daily.   Yes [provider]  rivaroxaban (XARELTO) 20 MG TABS tablet Take 20 mg by mouth every other day.   Yes [provider]  rosuvastatin (CRESTOR) 10 MG tablet Take 1 tablet (10 mg total) by mouth daily. 04/05/20 03/31/21 Yes Fay Records, MD  amoxicillin (AMOXIL) 500 MG capsule Take 1,000 mg by mouth 2 (two) times daily. Before dental visits 01/25/20   [provider]  docusate sodium (COLACE) 100 MG capsule Take 2 capsules (200 mg total) by mouth at bedtime. Patient not taking: Reported on 04/25/2020 05/01/19   Rogene Houston, MD    Scheduled Meds: . Derrill Memo ON 05/07/2020] amiodarone  200 mg Oral Daily  . aspirin EC  81 mg Oral Daily  .  clopidogrel  75 mg Oral Daily  . feeding supplement  1 Container Oral TID BM  . glimepiride  2 mg Oral Q breakfast  . guaiFENesin  600 mg Oral BID  . insulin aspart  0-24 Units Subcutaneous TID AC & HS  . isosorbide dinitrate  5 mg Oral TID  . mouth rinse  15 mL Mouth Rinse BID  . metoprolol tartrate  50 mg Oral BID  . pantoprazole  40 mg Oral Daily  . rosuvastatin  40 mg Oral Daily  . sodium chloride flush  3 mL Intravenous Q12H   Continuous Infusions: . sodium chloride    . ceFEPime (MAXIPIME) IV 2 g (05/06/20 0936)   PRN Meds:.sodium chloride, acetaminophen, alum & mag hydroxide-simeth, diphenoxylate-atropine, lip balm, loperamide HCl, ondansetron (ZOFRAN) IV, oxybutynin, sodium chloride flush, traMADol, Zinc Oxide  Allergies as of 04/25/2020 - Review Complete 04/25/2020  Allergen Reaction Noted  . Levofloxacin Anaphylaxis and Rash 04/03/2017  . Cardizem [diltiazem] Other (See Comments)  08/17/2015  . Lipitor [atorvastatin] Other (See Comments) 10/12/2019  . Sulfa antibiotics Itching 07/13/2014  . Diazepam Nausea Only 04/03/2017    Family History  Problem Relation Age of Onset  . Stroke Father   . Diabetes Mother   . Diabetes Sister   . Colon cancer Neg Hx     Social History   Socioeconomic History  . Marital status: Divorced    Spouse name: Not on file  . Number of children: Not on file  . Years of education: Not on file  . Highest education level: Not on file  Occupational History  . Not on file  Tobacco Use  . Smoking status: Never Smoker  . Smokeless tobacco: Never Used  Vaping Use  . Vaping Use: Never used  Substance and Sexual Activity  . Alcohol use: No    Alcohol/week: 0.0 standard drinks    Comment: socially   . Drug use: No  . Sexual activity: Not Currently    Partners: Male    Birth control/protection: None  Other Topics Concern  . Not on file  Social History Narrative  . Not on file   Social Determinants of Health   Financial Resource Strain:   . Difficulty of Paying Living Expenses: Not on file  Food Insecurity:   . Worried About Charity fundraiser in the Last Year: Not on file  . Ran Out of Food in the Last Year: Not on file  Transportation Needs:   . Lack of Transportation (Medical): Not on file  . Lack of Transportation (Non-Medical): Not on file  Physical Activity:   . Days of Exercise per Week: Not on file  . Minutes of Exercise per Session: Not on file  Stress:   . Feeling of Stress : Not on file  Social Connections:   . Frequency of Communication with Friends and Family: Not on file  . Frequency of Social Gatherings with Friends and Family: Not on file  . Attends Religious Services: Not on file  . Active Member of Clubs or Organizations: Not on file  . Attends Archivist Meetings: Not on file  . Marital Status: Not on file  Intimate Partner Violence:   . Fear of Current or Ex-Partner: Not on file  .  Emotionally Abused: Not on file  . Physically Abused: Not on file  . Sexually Abused: Not on file    Review of Systems: Review of Systems  Constitutional: Negative for chills and fever.  HENT: Negative for hearing  loss and tinnitus.   Eyes: Negative for pain and redness.  Respiratory: Negative for cough and shortness of breath.   Cardiovascular: Negative for chest pain and palpitations.  Gastrointestinal: Positive for abdominal pain, diarrhea and nausea. Negative for blood in stool, constipation, heartburn, melena and vomiting.  Genitourinary: Positive for dysuria. Negative for flank pain.  Musculoskeletal: Negative for falls and joint pain.  Skin: Negative for itching and rash.  Neurological: Negative for seizures and loss of consciousness.  Endo/Heme/Allergies: Negative for polydipsia. Does not bruise/bleed easily.  Psychiatric/Behavioral: Negative for substance abuse. The patient is not nervous/anxious.     Physical Exam: Vital signs: Vitals:   05/06/20 0953 05/06/20 1009  BP: (!) 121/59 (!) 137/91  Pulse: 78 76  Resp: 15 14  Temp: 97.8 F (36.6 C) 97.8 F (36.6 C)  SpO2: 97% 98%   Last BM Date: 05/05/20 Physical Exam Vitals reviewed.  Constitutional:      General: She is not in acute distress. HENT:     Head: Normocephalic and atraumatic.     Nose: Nose normal. No congestion.     Mouth/Throat:     Mouth: Mucous membranes are moist.     Pharynx: Oropharynx is clear.  Eyes:     General: No scleral icterus.    Extraocular Movements: Extraocular movements intact.     Conjunctiva/sclera: Conjunctivae normal.  Cardiovascular:     Rate and Rhythm: Normal rate and regular rhythm.     Pulses: Normal pulses.  Pulmonary:     Effort: Pulmonary effort is normal. No respiratory distress.     Breath sounds: Normal breath sounds.  Abdominal:     General: Bowel sounds are normal. There is no distension.     Palpations: Abdomen is soft. There is no mass.     Tenderness:  There is no abdominal tenderness. There is no guarding or rebound.     Hernia: No hernia is present.  Musculoskeletal:        General: No swelling or tenderness.     Cervical back: Normal range of motion and neck supple.  Skin:    General: Skin is warm and dry.  Neurological:     General: No focal deficit present.     Mental Status: She is oriented to person, place, and time. She is lethargic.  Psychiatric:        Mood and Affect: Mood normal.        Behavior: Behavior normal. Behavior is cooperative.     GI:  Lab Results: Recent Labs    05/05/20 0950  WBC 11.8*  HGB 10.9*  HCT 33.9*  PLT 554*   BMET Recent Labs    05/05/20 0950 05/06/20 0202  NA 139 138  K 3.4* 4.4  CL 107 108  CO2 24 20*  GLUCOSE 114* 101*  BUN 12 11  CREATININE 0.67 0.65  CALCIUM 8.5* 8.6*   LFT Recent Labs    05/06/20 0950  PROT 5.8*  ALBUMIN 2.5*  AST 25  ALT 22  ALKPHOS 83  BILITOT 0.4  BILIDIR 0.1  IBILI 0.3   PT/INR No results for input(s): LABPROT, INR in the last 72 hours.   Studies/Results: No results found.  Impression: Diarrhea x 1 week, not improving on antidiarrheals.  History of C. difficile infection.  Concern for recurrent C. difficile based on clinical presentation.  GI pathogen panel 05/01/2020 negative.  No recent C diff testing.  Plan: C. difficile stool testing.  If positive, treat with oral vancomycin.  If negative, we will add cholestyramine twice daily.  [facility] will follow.   LOS: 11 days   Salley Slaughter  PA-C 05/06/2020, 3:10 PM  Contact #  [phone]

## 2020-05-06 NOTE — Progress Notes (Signed)
CARDIAC REHAB PHASE I   PRE:  Rate/Rhythm: 80 SR    BP: sitting 121/59    SaO2: 93 RA  MODE:  Ambulation: 470 ft   POST:  Rate/Rhythm: 83 SR    BP: sitting 137/91     SaO2: 96 RA  Pt feeling better today. Able to get up with mod assist and walk with rollator in hall, no assist needed. Talkative, no c/o with walk (besides ongoing diarrhea). On return, return to bed as she began feeling hot. Encouraged x2 more walks and IS and recliner today. Progressing. [id number]   [place], ACSM 05/06/2020 10:26 AM

## 2020-05-06 NOTE — Progress Notes (Addendum)
[address]       [place] [address]             [phone]      9 Days Post-Op Procedure(s) (LRB): CORONARY ARTERY BYPASS GRAFTING (CABG) TIMES FIVE USING LEFT INTERNAL MAMMARY ARTERY, LEFT HARVESTED RADIAL ARTERY, RIGHT GREATER SAPHENOUS VEIN HARVESTED ENDOSCOPICALLY. (N/A) LEFT RADIAL ARTERY HARVEST (Left) TRANSESOPHAGEAL ECHOCARDIOGRAM (TEE) (N/A) INDOCYANINE GREEN FLUORESCENCE IMAGING (ICG) (N/A) Subjective: She continues to have diarrhea despite imodium and lomotil administration.   Objective: Vital signs in last 24 hours: Temp:  [97.6 F (36.4 C)-98.5 F (36.9 C)] 97.6 F (36.4 C) (11/19 0429) Pulse Rate:  [64-81] 74 (11/19 0429) Cardiac Rhythm: Normal sinus rhythm (11/18 1900) Resp:  [16-18] 18 (11/19 0429) BP: (122-154)/(55-85) 127/58 (11/19 0429) SpO2:  [96 %-100 %] 99 % (11/19 0429) Weight:  [65.4 kg] 65.4 kg (11/19 0429)     Intake/Output from previous day: 11/18 0701 - 11/19 0700 In: 110 [I.V.:10; IV Piggyback:100] Out: -  Intake/Output this shift: No intake/output data recorded.  General appearance: alert, cooperative and no distress Heart: regular rate and rhythm, S1, S2 normal, no murmur, click, rub or gallop Lungs: clear to auscultation bilaterally Abdomen: soft, tender in the center with palpation.  Extremities: extremities normal, atraumatic, no cyanosis or edema Wound: clean and dry  Lab Results: Recent Labs    05/05/20 0950  WBC 11.8*  HGB 10.9*  HCT 33.9*  PLT 554*   BMET:  Recent Labs    05/05/20 0950 05/06/20 0202  NA 139 138  K 3.4* 4.4  CL 107 108  CO2 24 20*  GLUCOSE 114* 101*  BUN 12 11  CREATININE 0.67 0.65  CALCIUM 8.5* 8.6*    PT/INR: No results for input(s): LABPROT, INR in the last 72 hours. ABG    Component Value Date/Time   PHART 7.375 04/28/2020 1013   HCO3 22.9 04/28/2020 1013   TCO2 24 04/28/2020 1013   ACIDBASEDEF 2.0 04/28/2020 1013   O2SAT 99.0 04/28/2020 1013   CBG (last 3)   Recent Labs    05/05/20 2123 05/05/20 2340 05/06/20 0613  GLUCAP 67* 126* 101*    Assessment/Plan: S/P Procedure(s) (LRB): CORONARY ARTERY BYPASS GRAFTING (CABG) TIMES FIVE USING LEFT INTERNAL MAMMARY ARTERY, LEFT HARVESTED RADIAL ARTERY, RIGHT GREATER SAPHENOUS VEIN HARVESTED ENDOSCOPICALLY. (N/A) LEFT RADIAL ARTERY HARVEST (Left) TRANSESOPHAGEAL ECHOCARDIOGRAM (TEE) (N/A) INDOCYANINE GREEN FLUORESCENCE IMAGING (ICG) (N/A)   1.CV-hx of atrial fibrillation, now NSR on Amio PO.BP has been stable, Continue lopressor 50mg  BID.  2. Pulm- tolerating room air with good oxygen saturation. CXR stable with tiny left pleural effusion. 3. Renal-0.65, electrolytes okay, no new labs 4. ID- positive UA, culture positive for pseudomonas-on Maxipime x 5 days 5. Endo- blood glucose well controlled 6. H and H stable, no new labs 7. GI- diarrhea, on imodium. She had a GI doctor that she follows with outpatient.  No eating much at this point. GERD symptoms-better this morning but still present. Nausea improved. BRAT diet today, will try yogert to help microbiome. GI panel sent 11/14 negative for all organisms.   Plan: Biggest issue at this point is her diarrhea. She states she had 6 bouts overnight and 12 bouts total yesterday. She describes the diarrhea as watery and greasy. We have stopped her metformin and colchicine. She does remain on maxipime for her UTI but her diarrhea started before her antibiotics. She continues on a BRAT diet and still wont eat much because she  is afraid she will have diarrhea. I have asked GI to see since she will be there through the weekend. Recovering well from her CABG.    LOS: 11 days    Katrina Castro 05/06/2020  reduce amiodarone to 200 mg bid to help with diarrhea and wait  for rec from GI- she has had prob with diarrhea pre- hospitalization  patient examined and medical record reviewed,agree with above note. Katrina Castro 05/06/2020

## 2020-05-07 LAB — GLUCOSE, CAPILLARY
Glucose-Capillary: 92 mg/dL (ref 70–99)
Glucose-Capillary: 124 mg/dL — ABNORMAL HIGH (ref 70–99)
Glucose-Capillary: 61 mg/dL — ABNORMAL LOW (ref 70–99)
Glucose-Capillary: 167 mg/dL — ABNORMAL HIGH (ref 70–99)

## 2020-05-07 NOTE — Progress Notes (Addendum)
      [address]       [place] [address]             [phone]      10 Days Post-Op Procedure(s) (LRB): CORONARY ARTERY BYPASS GRAFTING (CABG) TIMES FIVE USING LEFT INTERNAL MAMMARY ARTERY, LEFT HARVESTED RADIAL ARTERY, RIGHT GREATER SAPHENOUS VEIN HARVESTED ENDOSCOPICALLY. (N/A) LEFT RADIAL ARTERY HARVEST (Left) TRANSESOPHAGEAL ECHOCARDIOGRAM (TEE) (N/A) INDOCYANINE GREEN FLUORESCENCE IMAGING (ICG) (N/A) Subjective: No further loose stools since yesterday. C.Diff toxin has not been sent.  She has walked in the hall today. Complains of urinary incontinence.   Objective: Vital signs in last 24 hours: Temp:  [97.5 F (36.4 C)-98.3 F (36.8 C)] 98 F (36.7 C) (11/20 0748) Pulse Rate:  [49-82] 77 (11/20 0748) Cardiac Rhythm: Normal sinus rhythm (11/20 0816) Resp:  [13-20] 20 (11/20 0800) BP: (95-152)/(51-82) 95/82 (11/20 0748) SpO2:  [96 %-100 %] 96 % (11/20 0748) Weight:  [65.2 kg] 65.2 kg (11/20 0350)     Intake/Output from previous day: 11/19 0701 - 11/20 0700 In: -  Out: 472 [Urine:472] Intake/Output this shift: No intake/output data recorded.  General appearance: alert, cooperative and no distress Heart: regular rate and rhythm Lungs: clear to auscultation bilaterally Abdomen: soft mild tenderness Extremities: extremities normal, atraumatic, no cyanosis or edema Wound: clean and dry  Lab Results: Recent Labs    05/05/20 0950  WBC 11.8*  HGB 10.9*  HCT 33.9*  PLT 554*   BMET:  Recent Labs    05/05/20 0950 05/06/20 0202  NA 139 138  K 3.4* 4.4  CL 107 108  CO2 24 20*  GLUCOSE 114* 101*  BUN 12 11  CREATININE 0.67 0.65  CALCIUM 8.5* 8.6*    PT/INR: No results for input(s): LABPROT, INR in the last 72 hours. ABG    Component Value Date/Time   PHART 7.375 04/28/2020 1013   HCO3 22.9 04/28/2020 1013   TCO2 24 04/28/2020 1013   ACIDBASEDEF 2.0 04/28/2020 1013   O2SAT 99.0 04/28/2020 1013   CBG (last 3)  Recent Labs     05/06/20 1751 05/06/20 2142 05/07/20 0619  GLUCAP 126* 136* 92    Assessment/Plan: S/P Procedure(s) (LRB): CORONARY ARTERY BYPASS GRAFTING (CABG) TIMES FIVE USING LEFT INTERNAL MAMMARY ARTERY, LEFT HARVESTED RADIAL ARTERY, RIGHT GREATER SAPHENOUS VEIN HARVESTED ENDOSCOPICALLY. (N/A) LEFT RADIAL ARTERY HARVEST (Left) TRANSESOPHAGEAL ECHOCARDIOGRAM (TEE) (N/A) INDOCYANINE GREEN FLUORESCENCE IMAGING (ICG) (N/A)   1.CV-hx of atrial fibrillation, remains in NSR on Amio PO.BP has been stable, Continue lopressor 50mg  BID.  2. Pulm- tolerating room air with good oxygen saturation.  3. Renal-0.65, electrolytes okay, no new labs 4. ID- positive UA, culture positive for pseudomonas-on Maxipime x 5 days 5. Endo- blood glucose well controlled 6. H and H stable, no new labs 7. GI- diarrhea resolved as of yesterday. Appreciate consult from GI. Recurrent C.Diff infection suspected but no further stools since test ordered.     LOS: 12 days    [name], [place] [phone] 05/07/2020   Agree with above Appreciate GI recs, awaiting C.diff study  Lajuana Matte

## 2020-05-07 NOTE — Progress Notes (Signed)
CARDIAC REHAB PHASE I   PRE:  Rate/Rhythm: 82 SR  BP:  Supine:   Sitting: 114/62  Standing:    SaO2: would not register  MODE:  Ambulation: 370 ft   POST:  Rate/Rhythm: 89 SR  BP:  Supine:   Sitting: 139/60  Standing:    SaO2: 97%RA 0906-1000 Pt c/o being wet upon entering room. Helped pt get cleaned up and put on new depends. Pt c/o feeling a little dizzy today. BP stable. Pt walked 370 ft on RA with gait belt use, rollator and asst x 1. Did not need to sit and rest during walk. Requested to go to bed after walk. Bed alarm put on. Encouraged walks with Mobility specialist and staff.   Graylon Good, RN BSN  05/07/2020 9:54 AM

## 2020-05-07 NOTE — Progress Notes (Signed)
Mobility Specialist - Progress Note   05/07/20 1444  Mobility  Activity Refused mobility   Refused mobility due to feeling stressed and recently getting into an argument w/ her daughter. Pt c/o lower extremity cramping as well.   Pricilla Handler Mobility Specialist Mobility Specialist Phone: [phone]

## 2020-05-08 LAB — GLUCOSE, CAPILLARY
Glucose-Capillary: 92 mg/dL (ref 70–99)
Glucose-Capillary: 207 mg/dL — ABNORMAL HIGH (ref 70–99)
Glucose-Capillary: 162 mg/dL — ABNORMAL HIGH (ref 70–99)
Glucose-Capillary: 67 mg/dL — ABNORMAL LOW (ref 70–99)

## 2020-05-08 NOTE — Plan of Care (Signed)

## 2020-05-08 NOTE — Progress Notes (Addendum)
      Katrina Castro [address]       [place] [address]             [phone]      11 Days Post-Op Procedure(s) (LRB): CORONARY ARTERY BYPASS GRAFTING (CABG) TIMES FIVE USING LEFT INTERNAL MAMMARY ARTERY, LEFT HARVESTED RADIAL ARTERY, RIGHT GREATER SAPHENOUS VEIN HARVESTED ENDOSCOPICALLY. (N/A) LEFT RADIAL ARTERY HARVEST (Left) TRANSESOPHAGEAL ECHOCARDIOGRAM (TEE) (N/A) INDOCYANINE GREEN FLUORESCENCE IMAGING (ICG) (N/A) Subjective: No further loose stools since Friday 11/19 so C.Diff toxin has not been sent.  Says she is having a lot of gas.  She complains of nausea and poor appetite. She did not walk in the hall yesterday.  Objective: Vital signs in last 24 hours: Temp:  [97.6 F (36.4 C)-98.3 F (36.8 C)] 97.6 F (36.4 C) (11/21 0920) Pulse Rate:  [60-83] 80 (11/21 0920) Cardiac Rhythm: Normal sinus rhythm (11/21 0751) Resp:  [16-20] 16 (11/21 0920) BP: (103-138)/(53-74) 122/64 (11/21 0920) SpO2:  [94 %-98 %] 97 % (11/21 0920) Weight:  [64.7 kg] 64.7 kg (11/21 0417)     Intake/Output from previous day: 11/20 0701 - 11/21 0700 In: 220 [P.O.:120; IV Piggyback:100] Out: -  Intake/Output this shift: Total I/O In: 240 [P.O.:240] Out: -   General appearance: alert, cooperative and no distress Heart: regular rate and rhythm Lungs: clear to auscultation bilaterally Abdomen: soft mild tenderness in lower abdomen. Extremities: extremities normal, atraumatic, no cyanosis or edema Wound: clean and dry  Lab Results: Recent Labs    05/05/20 0950  WBC 11.8*  HGB 10.9*  HCT 33.9*  PLT 554*   BMET:  Recent Labs    05/05/20 0950 05/06/20 0202  NA 139 138  K 3.4* 4.4  CL 107 108  CO2 24 20*  GLUCOSE 114* 101*  BUN 12 11  CREATININE 0.67 0.65  CALCIUM 8.5* 8.6*    PT/INR: No results for input(s): LABPROT, INR in the last 72 hours. ABG    Component Value Date/Time   PHART 7.375 04/28/2020 1013   HCO3 22.9 04/28/2020 1013   TCO2 24 04/28/2020 1013    ACIDBASEDEF 2.0 04/28/2020 1013   O2SAT 99.0 04/28/2020 1013   CBG (last 3)  Recent Labs    05/07/20 1648 05/07/20 2123 05/08/20 0629  GLUCAP 61* 167* 92    Assessment/Plan: S/P Procedure(s) (LRB): CORONARY ARTERY BYPASS GRAFTING (CABG) TIMES FIVE USING LEFT INTERNAL MAMMARY ARTERY, LEFT HARVESTED RADIAL ARTERY, RIGHT GREATER SAPHENOUS VEIN HARVESTED ENDOSCOPICALLY. (N/A) LEFT RADIAL ARTERY HARVEST (Left) TRANSESOPHAGEAL ECHOCARDIOGRAM (TEE) (N/A) INDOCYANINE GREEN FLUORESCENCE IMAGING (ICG) (N/A)   1.CV-hx of atrial fibrillation, remains in NSR on Amio PO.BP has been stable, Continue lopressor 50mg  BID.  2. Pulm- tolerating room air with good oxygen saturation.  3. Renal function has been stable since surgery.  Repeat labs in a.m. 4. ID- positive UA, culture positive for pseudomonas-on day 4/5 of Maxipime 5. Endo- blood glucose well controlled 6. GI- diarrhea resolved as of Friday afternoon. Appreciate consult from GI. Recurrent C.Diff infection was suspected but no further stools since C.Diff test ordered.  Having some nausea without vomiting.  Abdominal exam unchanged. Suspect Sx related to ABX, will complete course tomorrow.     LOS: 13 days    [name], [place] [phone] 05/08/2020   Doing well. Diarrhea is resolved. Patient remains on antibiotics for her positive urinalysis. Dispel planning.  Katrina Castro

## 2020-05-08 NOTE — Progress Notes (Signed)
Mobility Specialist - Progress Note   05/08/20 1327  Mobility  Activity Ambulated in hall  Level of Assistance Standby assist, set-up cues, supervision of patient - no hands on  Assistive Device Four wheel walker  Distance Ambulated (ft) 490 ft  Mobility Response Tolerated well  Mobility performed by Mobility specialist  $Mobility charge 1 Mobility     Pre-mobility: 73 HR During mobility: 87 HR Post-mobility: 78 HR  Pt asx throughout ambulation.   Pricilla Handler Mobility Specialist Mobility Specialist Phone: [phone]

## 2020-05-09 LAB — COMPREHENSIVE METABOLIC PANEL
ALT: 17 U/L (ref 0–44)
AST: 21 U/L (ref 15–41)
Albumin: 2.4 g/dL — ABNORMAL LOW (ref 3.5–5.0)
Alkaline Phosphatase: 75 U/L (ref 38–126)
Anion gap: 11 (ref 5–15)
BUN: 8 mg/dL (ref 8–23)
CO2: 23 mmol/L (ref 22–32)
Calcium: 8.9 mg/dL (ref 8.9–10.3)
Chloride: 103 mmol/L (ref 98–111)
Creatinine, Ser: 0.68 mg/dL (ref 0.44–1.00)
GFR, Estimated: >60 mL/min (ref >60)
Glucose, Bld: 186 mg/dL — ABNORMAL HIGH (ref 70–99)
Potassium: 3.9 mmol/L (ref 3.5–5.1)
Sodium: 137 mmol/L (ref 135–145)
Total Bilirubin: 0.5 mg/dL (ref 0.3–1.2)
Total Protein: 5.5 g/dL — ABNORMAL LOW (ref 6.5–8.1)

## 2020-05-09 LAB — CBC
HCT: 34.4 % — ABNORMAL LOW (ref 36.0–46.0)
Hemoglobin: 10.8 g/dL — ABNORMAL LOW (ref 12.0–15.0)
MCH: 27 pg (ref 26.0–34.0)
MCHC: 31.4 g/dL (ref 30.0–36.0)
MCV: 86 fL (ref 80.0–100.0)
Platelets: 575 10*3/uL — ABNORMAL HIGH (ref 150–400)
RBC: 4 MIL/uL (ref 3.87–5.11)
RDW: 16.2 % — ABNORMAL HIGH (ref 11.5–15.5)
WBC: 12.9 10*3/uL — ABNORMAL HIGH (ref 4.0–10.5)
nRBC: 0 % (ref 0.0–0.2)

## 2020-05-09 LAB — GLUCOSE, CAPILLARY: Glucose-Capillary: 123 mg/dL — ABNORMAL HIGH (ref 70–99)

## 2020-05-09 NOTE — Progress Notes (Signed)
On my return pt c/o 9/10 left side pain, wraps around from chest to back and to shoulder. Pt initially stated there was no change in position and breathing but later stated it was worse with movement. RN notified, doing EKG. Applied 2L O2. Pt asking for pain meds, which is unusual for her. HR 74 SR, SaO2 100 2L.  Yves Dill CES, ACSM 2:23 PM 05/09/2020 [phone]

## 2020-05-09 NOTE — Progress Notes (Signed)
Chart reviewed.  No further diarrhea since 11/19.  C. difficile testing not completed as a result.  If diarrhea recurs, recommend C. difficile testing.  [facility] will sign off.  Please contact us if we can be of any further assistance during this hospital stay.

## 2020-05-09 NOTE — Progress Notes (Signed)
      [address]       ,[place] [address]             [phone]      12 Days Post-Op Procedure(s) (LRB): CORONARY ARTERY BYPASS GRAFTING (CABG) TIMES FIVE USING LEFT INTERNAL MAMMARY ARTERY, LEFT HARVESTED RADIAL ARTERY, RIGHT GREATER SAPHENOUS VEIN HARVESTED ENDOSCOPICALLY. (N/A) LEFT RADIAL ARTERY HARVEST (Left) TRANSESOPHAGEAL ECHOCARDIOGRAM (TEE) (N/A) INDOCYANINE GREEN FLUORESCENCE IMAGING (ICG) (N/A) Subjective: Feels okay this morning. She did have some incontinence yesterday which she gets embarrassed about.    Objective: Vital signs in last 24 hours: Temp:  [97.6 F (36.4 C)-98.7 F (37.1 C)] 98 F (36.7 C) (11/22 0808) Pulse Rate:  [66-83] 83 (11/22 0808) Cardiac Rhythm: Normal sinus rhythm (11/22 0758) Resp:  [15-20] 19 (11/22 0808) BP: (102-196)/(51-91) 196/91 (11/22 0808) SpO2:  [96 %-100 %] 100 % (11/22 0808) Weight:  [64.6 kg] 64.6 kg (11/22 0552)     Intake/Output from previous day: 11/21 0701 - 11/22 0700 In: 890 [P.O.:790; IV Piggyback:100] Out: 400 [Urine:400] Intake/Output this shift: No intake/output data recorded.  General appearance: alert, cooperative and no distress Heart: regular rate and rhythm, S1, S2 normal, no murmur, click, rub or gallop Lungs: clear to auscultation bilaterally Abdomen: soft, non-tender; bowel sounds normal; no masses,  no organomegaly Extremities: extremities normal, atraumatic, no cyanosis or edema Wound: clean and dry, staples in place  Lab Results: Recent Labs    05/09/20 0139  WBC 12.9*  HGB 10.8*  HCT 34.4*  PLT 575*   BMET:  Recent Labs    05/09/20 0139  NA 137  K 3.9  CL 103  CO2 23  GLUCOSE 186*  BUN 8  CREATININE 0.68  CALCIUM 8.9    PT/INR: No results for input(s): LABPROT, INR in the last 72 hours. ABG    Component Value Date/Time   PHART 7.375 04/28/2020 1013   HCO3 22.9 04/28/2020 1013   TCO2 24 04/28/2020 1013   ACIDBASEDEF 2.0 04/28/2020 1013   O2SAT 99.0  04/28/2020 1013   CBG (last 3)  Recent Labs    05/08/20 1318 05/08/20 1720 05/08/20 2137  GLUCAP 207* 162* 67*    Assessment/Plan: S/P Procedure(s) (LRB): CORONARY ARTERY BYPASS GRAFTING (CABG) TIMES FIVE USING LEFT INTERNAL MAMMARY ARTERY, LEFT HARVESTED RADIAL ARTERY, RIGHT GREATER SAPHENOUS VEIN HARVESTED ENDOSCOPICALLY. (N/A) LEFT RADIAL ARTERY HARVEST (Left) TRANSESOPHAGEAL ECHOCARDIOGRAM (TEE) (N/A) INDOCYANINE GREEN FLUORESCENCE IMAGING (ICG) (N/A)  1.CV-hx ofatrial fibrillation, remains in NSR on Amio PO.BP has been stable,Continue lopressor 50mg  BID.  2. Pulm- tolerating room air with good oxygen saturation.  3. Renal function has been stable since surgery.  Electrolytes okay 4. ID- positive UA, culture positive for pseudomonas-on day 5/5 of Maxipime 5. Endo- blood glucose well controlled 6. GI- diarrhea resolved as of Friday afternoon. Appreciate GI consult.  7. H and H stable.   Plan: Last dose of antibiotics at 22:00 tonight. Will be able to discharge tomorrow morning. Daughter, [name] updated this morning. May need a snack at night to prevent hypoglycemia before bed. If incontinence continues may need f/u with urology.     LOS: 14 days    Elgie Collard 05/09/2020

## 2020-05-09 NOTE — Plan of Care (Signed)

## 2020-05-09 NOTE — Progress Notes (Signed)
Mobility Specialist: Progress Note   05/09/20 1711  Mobility  Activity Ambulated in hall  Level of Assistance Contact guard assist, steadying assist  Assistive Device Four wheel walker  Distance Ambulated (ft) 214 ft  Mobility Response Tolerated fair  Mobility performed by Mobility specialist  Bed Position Semi-fowlers  $Mobility charge 1 Mobility   Pre-Mobility: 76 HR, 100% SpO2 Post-Mobility: 78 HR, 151/60 BP, 97% SpO2  Pt received pain medication prior to ambulation. Pt said she felt a little "whoozy" but was willing to participate. During ambulation pt's R knee gave slightly a few times. Pt states her knee was cramping. I assisted pt back to room. Pt's R knee started giving more as we got back to the room, RN notified. Pt struggled to follow commands when told to turn around and when getting back in the bed after returning to room, RN notified.   [facility] Ladawna Walgren Mobility Specialist

## 2020-05-09 NOTE — Progress Notes (Signed)
Pt declined ambulation right now. sts she feels so bad, no sleep. Seems significantly depressed. Encouraged ambulation, will f/u in 30 min. Yves Dill CES, ACSM 1:08 PM 05/09/2020

## 2020-05-09 NOTE — Progress Notes (Signed)
Inpatient Diabetes Program Recommendations  AACE/ADA: New Consensus Statement on Inpatient Glycemic Control (2015)  Target Ranges:  Prepandial:   less than 140 mg/dL      Peak postprandial:   less than 180 mg/dL (1-2 hours)      Critically ill patients:  140 - 180 mg/dL   Lab Results  Component Value Date   GLUCAP 67 (L) 05/08/2020   HGBA1C 7.5 (H) 04/25/2020    Review of Glycemic Control Results for Katrina Castro, Katrina Castro (MRN [id number]) as of 05/09/2020 13:36  Ref. Range 05/07/2020 21:23 05/08/2020 06:29 05/08/2020 13:18 05/08/2020 17:20 05/08/2020 21:37  Glucose-Capillary Latest Ref Range: 70 - 99 mg/dL 167 (H) 92 207 (H) 162 (H) 67 (L)   Diabetes history: DM 2 Outpatient Diabetes medications:  Metformin 500 mg bid,  Amaryl 2 mg daily Current orders for Inpatient glycemic control:  Amaryl 2 mg daily Novolog 0-24 units tid with meals and HS  Inpatient Diabetes Program Recommendations:    Note mild low on 11/21- May consider reducing Novolog correction to moderate tid with meals and HS.    Thanks,  Adah Perl, RN, BC-ADM Inpatient Diabetes Coordinator Pager [phone] (8a-5p)

## 2020-05-09 NOTE — Progress Notes (Signed)
CBG at 2137 was 67. Apple juice given with peanut butter and cracker. Recheck blood sugar was 135. Will continue to monitor.

## 2020-05-10 ENCOUNTER — Other Ambulatory Visit: Payer: Self-pay | Admitting: Cardiothoracic Surgery

## 2020-05-10 DIAGNOSIS — Z951 Presence of aortocoronary bypass graft: Secondary | ICD-10-CM

## 2020-05-10 LAB — GLUCOSE, CAPILLARY
Glucose-Capillary: 176 mg/dL — ABNORMAL HIGH (ref 70–99)
Glucose-Capillary: 108 mg/dL — ABNORMAL HIGH (ref 70–99)

## 2020-05-10 MED ORDER — ZINC OXIDE 12.8 % EX OINT: TOPICAL_OINTMENT | CUTANEOUS | 0 refills | Status: DC | PRN

## 2020-05-10 MED ORDER — ROSUVASTATIN CALCIUM 40 MG PO TABS
40.0000 mg | ORAL_TABLET | Freq: Every day | ORAL | 1 refills | Status: DC
Start: 2020-05-10 — End: 2020-11-25

## 2020-05-10 MED ORDER — ASPIRIN 81 MG PO TBEC
81.0000 mg | DELAYED_RELEASE_TABLET | Freq: Every day | ORAL | 11 refills | Status: DC
Start: 2020-05-10 — End: 2021-07-21

## 2020-05-10 MED ORDER — ACETAMINOPHEN 325 MG PO TABS
650.0000 mg | ORAL_TABLET | Freq: Four times a day (QID) | ORAL | Status: DC | PRN
Start: 2020-05-10 — End: 2021-01-04

## 2020-05-10 MED ORDER — TRAMADOL HCL 50 MG PO TABS: 50.0000 mg | ORAL_TABLET | Freq: Three times a day (TID) | ORAL | 0 refills | Status: DC | PRN

## 2020-05-10 MED ORDER — AMIODARONE HCL 200 MG PO TABS
200.0000 mg | ORAL_TABLET | Freq: Every day | ORAL | 1 refills | Status: DC
Start: 2020-05-10 — End: 2020-07-15

## 2020-05-10 MED ORDER — CLOPIDOGREL BISULFATE 75 MG PO TABS
75.0000 mg | ORAL_TABLET | Freq: Every day | ORAL | 1 refills | Status: DC
Start: 2020-05-10 — End: 2020-07-15

## 2020-05-10 MED ORDER — ISOSORBIDE DINITRATE 5 MG PO TABS
5.0000 mg | ORAL_TABLET | Freq: Three times a day (TID) | ORAL | 0 refills | Status: DC
Start: 2020-05-10 — End: 2020-08-31

## 2020-05-10 NOTE — Progress Notes (Signed)
Discussed IS, sternal precautions, exercise, diet, and CRPII with pt and daughter. Receptive. Will refer to [place]. Highly encouraged more activity and increased eating at home.  [phone] [place], ACSM 11:23 AM 05/10/2020

## 2020-05-10 NOTE — Progress Notes (Signed)
Pt discharged from unit. Medication/discharge instruction given to pt and daughter. Sutures and staples removed per order .   Phoebe Sharps, RN

## 2020-05-10 NOTE — Care Management Important Message (Signed)
Important Message  Patient Details  Name: ANNALESE STINER MRN: [id number] Date of Birth: [birth year]   Medicare Important Message Given:  Yes     Shelda Altes 05/10/2020, 11:39 AM

## 2020-05-10 NOTE — TOC Transition Note (Signed)
Transition of Care (TOC) - CM/SW Discharge Note Marvetta Gibbons RN, BSN Transitions of Care Unit 4E- RN Case Manager See Treatment Team for direct phone #    Patient Details  Name: EMILIE CARP MRN: [id number] Date of Birth: Nov 30, 1945  Transition of Care [facility]) CM/SW Contact:  Dawayne Patricia, RN Phone Number: 05/10/2020, 2:31 PM   Clinical Narrative:    Pt stable for transition home today after completing IV abx, pt has DME for home that was delivered last week. HH has been set up with [facility] [place] as per arrangements last week- Erin with [facility] aware of discharge today for start of care.   Final next level of care: [place] Barriers to Discharge: Continued Medical Work up   Patient Goals and CMS Choice Patient states their goals for this hospitalization and ongoing recovery are:: return home and recover CMS Medicare.gov Compare Post Acute Care list provided to:: Patient Choice offered to / list presented to : Patient, Adult Children  Discharge Placement                 Home with [facility]      Discharge Plan and Services   Discharge Planning Services: CM Consult Post Acute Care Choice: Home Health, Durable Medical Equipment          DME Arranged: 3-N-1, Walker rolling with seat DME Agency: [employer] Date DME Agency Contacted: 05/03/20 Time DME Agency Contacted: 42 Representative spoke with at DME Agency: [place]: PT, OT [place] Agency: [place] ([place]) Date [place]: 05/04/20 Time [place]: 1130 Representative spoke with at [place]: [place] ([place]) Interventions     Readmission Risk Interventions Readmission Risk Prevention Plan 05/03/2020  Transportation Screening Complete  PCP or Specialist Appt within 5-7 Days Complete  Home Care Screening Complete  Medication Review (RN CM) Complete  Some recent data might be hidden

## 2020-05-10 NOTE — Progress Notes (Signed)
      [address]       [place], [address]             [phone]      13 Days Post-Op Procedure(s) (LRB): CORONARY ARTERY BYPASS GRAFTING (CABG) TIMES FIVE USING LEFT INTERNAL MAMMARY ARTERY, LEFT HARVESTED RADIAL ARTERY, RIGHT GREATER SAPHENOUS VEIN HARVESTED ENDOSCOPICALLY. (N/A) LEFT RADIAL ARTERY HARVEST (Left) TRANSESOPHAGEAL ECHOCARDIOGRAM (TEE) (N/A) INDOCYANINE GREEN FLUORESCENCE IMAGING (ICG) (N/A) Subjective: Feels good this morning.   Objective: Vital signs in last 24 hours: Temp:  [97.5 F (36.4 C)-98.3 F (36.8 C)] 98.3 F (36.8 C) (11/23 0421) Pulse Rate:  [63-83] 67 (11/23 0600) Cardiac Rhythm: Normal sinus rhythm (11/22 1901) Resp:  [11-24] 19 (11/23 0600) BP: (121-196)/(57-91) 121/57 (11/23 0421) SpO2:  [92 %-100 %] 99 % (11/23 0600)    Intake/Output from previous day: 11/22 0701 - 11/23 0700 In: 240 [P.O.:240] Out: 100 [Urine:100] Intake/Output this shift: No intake/output data recorded.  General appearance: alert, cooperative and no distress Heart: regular rate and rhythm, S1, S2 normal, no murmur, click, rub or gallop Lungs: clear to auscultation bilaterally Abdomen: soft, non-tender; bowel sounds normal; no masses,  no organomegaly Extremities: extremities normal, atraumatic, no cyanosis or edema Wound: clean and dry  Lab Results: Recent Labs    05/09/20 0139  WBC 12.9*  HGB 10.8*  HCT 34.4*  PLT 575*   BMET:  Recent Labs    05/09/20 0139  NA 137  K 3.9  CL 103  CO2 23  GLUCOSE 186*  BUN 8  CREATININE 0.68  CALCIUM 8.9    PT/INR: No results for input(s): LABPROT, INR in the last 72 hours. ABG    Component Value Date/Time   PHART 7.375 04/28/2020 1013   HCO3 22.9 04/28/2020 1013   TCO2 24 04/28/2020 1013   ACIDBASEDEF 2.0 04/28/2020 1013   O2SAT 99.0 04/28/2020 1013   CBG (last 3)  Recent Labs    05/09/20 2140 05/10/20 0059 05/10/20 0605  GLUCAP 123* 176* 108*    Assessment/Plan: S/P  Procedure(s) (LRB): CORONARY ARTERY BYPASS GRAFTING (CABG) TIMES FIVE USING LEFT INTERNAL MAMMARY ARTERY, LEFT HARVESTED RADIAL ARTERY, RIGHT GREATER SAPHENOUS VEIN HARVESTED ENDOSCOPICALLY. (N/A) LEFT RADIAL ARTERY HARVEST (Left) TRANSESOPHAGEAL ECHOCARDIOGRAM (TEE) (N/A) INDOCYANINE GREEN FLUORESCENCE IMAGING (ICG) (N/A)  1.CV-hx ofatrial fibrillation, remains in NSR on Amio PO.BP has been stable,Continue lopressor 50mg  BID.  2. Pulm- tolerating room air with good oxygen saturation.  3. Renalfunction has been stable since surgery. Electrolytes okay 4. ID- positive UA, culture positive for pseudomonas-onday 5/5 of Maxipime 5. Endo- blood glucose well controlled 6. GI- diarrhea resolved as ofFriday afternoon. Appreciate GI consult.  7. H and H stable.   Plan: Last day of antibiotics today. Will discharge today. Discussed with daughter, Katrina Castro   LOS: 15 days    Katrina Castro 05/10/2020

## 2020-05-10 NOTE — Progress Notes (Signed)
PT Cancellation Note  Patient Details Name: Katrina Castro MRN: [id number] DOB: 1946/02/23   Cancelled Treatment:    Reason Eval/Treat Not Completed: (P) Patient declined, no reason specified (Pt adamantly refused PT intervention.  Pt reports. " Not today! I have too much going on." Provided education and encouragement and she continues to decline.)   Cason Luffman Eli Hose 05/10/2020, 10:15 AM  Erasmo Leventhal , PTA Acute Rehabilitation Services Pager [phone] Office [phone]

## 2020-05-11 DIAGNOSIS — R531 Weakness: Secondary | ICD-10-CM | POA: Diagnosis not present

## 2020-05-11 DIAGNOSIS — R1310 Dysphagia, unspecified: Secondary | ICD-10-CM | POA: Diagnosis not present

## 2020-05-11 DIAGNOSIS — I25110 Atherosclerotic heart disease of native coronary artery with unstable angina pectoris: Secondary | ICD-10-CM | POA: Diagnosis not present

## 2020-05-11 DIAGNOSIS — K2289 Other specified disease of esophagus: Secondary | ICD-10-CM | POA: Diagnosis not present

## 2020-05-11 DIAGNOSIS — E785 Hyperlipidemia, unspecified: Secondary | ICD-10-CM | POA: Diagnosis not present

## 2020-05-11 DIAGNOSIS — E119 Type 2 diabetes mellitus without complications: Secondary | ICD-10-CM | POA: Diagnosis not present

## 2020-05-11 DIAGNOSIS — I499 Cardiac arrhythmia, unspecified: Secondary | ICD-10-CM | POA: Diagnosis not present

## 2020-05-11 DIAGNOSIS — M1990 Unspecified osteoarthritis, unspecified site: Secondary | ICD-10-CM | POA: Diagnosis not present

## 2020-05-11 DIAGNOSIS — I480 Paroxysmal atrial fibrillation: Secondary | ICD-10-CM | POA: Diagnosis not present

## 2020-05-11 DIAGNOSIS — I10 Essential (primary) hypertension: Secondary | ICD-10-CM | POA: Diagnosis not present

## 2020-05-11 DIAGNOSIS — F32A Depression, unspecified: Secondary | ICD-10-CM | POA: Diagnosis not present

## 2020-05-11 DIAGNOSIS — Z48812 Encounter for surgical aftercare following surgery on the circulatory system: Secondary | ICD-10-CM | POA: Diagnosis not present

## 2020-05-11 LAB — GLUCOSE, CAPILLARY
Glucose-Capillary: 136 mg/dL — ABNORMAL HIGH (ref 70–99)
Glucose-Capillary: 118 mg/dL — ABNORMAL HIGH (ref 70–99)
Glucose-Capillary: 154 mg/dL — ABNORMAL HIGH (ref 70–99)
Glucose-Capillary: 106 mg/dL — ABNORMAL HIGH (ref 70–99)
Glucose-Capillary: 126 mg/dL — ABNORMAL HIGH (ref 70–99)

## 2020-05-12 ENCOUNTER — Emergency Department (HOSPITAL_COMMUNITY): Payer: Medicare Other

## 2020-05-12 ENCOUNTER — Inpatient Hospital Stay (HOSPITAL_COMMUNITY)
Admission: EM | Admit: 2020-05-12 | Discharge: 2020-05-17 | DRG: 372 | Disposition: A | Discharge status: Home Health | Payer: Medicare Other | Attending: Family Medicine | Admitting: Family Medicine

## 2020-05-12 ENCOUNTER — Other Ambulatory Visit: Payer: Self-pay

## 2020-05-12 ENCOUNTER — Encounter (HOSPITAL_COMMUNITY): Payer: Self-pay | Admitting: Emergency Medicine

## 2020-05-12 ENCOUNTER — Inpatient Hospital Stay (HOSPITAL_COMMUNITY): Payer: Medicare Other

## 2020-05-12 DIAGNOSIS — R110 Nausea: Secondary | ICD-10-CM

## 2020-05-12 DIAGNOSIS — K8500 Idiopathic acute pancreatitis without necrosis or infection: Secondary | ICD-10-CM | POA: Diagnosis not present

## 2020-05-12 DIAGNOSIS — K8689 Other specified diseases of pancreas: Secondary | ICD-10-CM | POA: Diagnosis not present

## 2020-05-12 DIAGNOSIS — Z87892 Personal history of anaphylaxis: Secondary | ICD-10-CM

## 2020-05-12 DIAGNOSIS — R509 Fever, unspecified: Secondary | ICD-10-CM | POA: Diagnosis present

## 2020-05-12 DIAGNOSIS — Z833 Family history of diabetes mellitus: Secondary | ICD-10-CM

## 2020-05-12 DIAGNOSIS — I959 Hypotension, unspecified: Secondary | ICD-10-CM | POA: Diagnosis not present

## 2020-05-12 DIAGNOSIS — Z7982 Long term (current) use of aspirin: Secondary | ICD-10-CM | POA: Diagnosis not present

## 2020-05-12 DIAGNOSIS — E119 Type 2 diabetes mellitus without complications: Secondary | ICD-10-CM | POA: Diagnosis present

## 2020-05-12 DIAGNOSIS — Z882 Allergy status to sulfonamides status: Secondary | ICD-10-CM

## 2020-05-12 DIAGNOSIS — R112 Nausea with vomiting, unspecified: Secondary | ICD-10-CM | POA: Diagnosis not present

## 2020-05-12 DIAGNOSIS — Z888 Allergy status to other drugs, medicaments and biological substances status: Secondary | ICD-10-CM | POA: Diagnosis not present

## 2020-05-12 DIAGNOSIS — E876 Hypokalemia: Secondary | ICD-10-CM | POA: Diagnosis not present

## 2020-05-12 DIAGNOSIS — R0902 Hypoxemia: Secondary | ICD-10-CM | POA: Diagnosis not present

## 2020-05-12 DIAGNOSIS — R1013 Epigastric pain: Secondary | ICD-10-CM

## 2020-05-12 DIAGNOSIS — Z20822 Contact with and (suspected) exposure to covid-19: Secondary | ICD-10-CM | POA: Diagnosis present

## 2020-05-12 DIAGNOSIS — I2510 Atherosclerotic heart disease of native coronary artery without angina pectoris: Secondary | ICD-10-CM | POA: Diagnosis not present

## 2020-05-12 DIAGNOSIS — A0472 Enterocolitis due to Clostridium difficile, not specified as recurrent: Secondary | ICD-10-CM | POA: Diagnosis present

## 2020-05-12 DIAGNOSIS — K863 Pseudocyst of pancreas: Secondary | ICD-10-CM | POA: Diagnosis not present

## 2020-05-12 DIAGNOSIS — E860 Dehydration: Secondary | ICD-10-CM | POA: Diagnosis not present

## 2020-05-12 DIAGNOSIS — Z79899 Other long term (current) drug therapy: Secondary | ICD-10-CM | POA: Diagnosis not present

## 2020-05-12 DIAGNOSIS — Z7902 Long term (current) use of antithrombotics/antiplatelets: Secondary | ICD-10-CM | POA: Diagnosis not present

## 2020-05-12 DIAGNOSIS — R531 Weakness: Secondary | ICD-10-CM

## 2020-05-12 DIAGNOSIS — J9811 Atelectasis: Secondary | ICD-10-CM | POA: Diagnosis not present

## 2020-05-12 DIAGNOSIS — K862 Cyst of pancreas: Secondary | ICD-10-CM | POA: Diagnosis not present

## 2020-05-12 DIAGNOSIS — Z881 Allergy status to other antibiotic agents status: Secondary | ICD-10-CM

## 2020-05-12 DIAGNOSIS — K224 Dyskinesia of esophagus: Secondary | ICD-10-CM | POA: Diagnosis not present

## 2020-05-12 DIAGNOSIS — E1165 Type 2 diabetes mellitus with hyperglycemia: Secondary | ICD-10-CM | POA: Diagnosis not present

## 2020-05-12 DIAGNOSIS — K760 Fatty (change of) liver, not elsewhere classified: Secondary | ICD-10-CM | POA: Diagnosis not present

## 2020-05-12 DIAGNOSIS — K859 Acute pancreatitis without necrosis or infection, unspecified: Secondary | ICD-10-CM | POA: Diagnosis not present

## 2020-05-12 DIAGNOSIS — R630 Anorexia: Secondary | ICD-10-CM | POA: Diagnosis not present

## 2020-05-12 DIAGNOSIS — Z87440 Personal history of urinary (tract) infections: Secondary | ICD-10-CM

## 2020-05-12 DIAGNOSIS — M1990 Unspecified osteoarthritis, unspecified site: Secondary | ICD-10-CM | POA: Diagnosis not present

## 2020-05-12 DIAGNOSIS — I480 Paroxysmal atrial fibrillation: Secondary | ICD-10-CM | POA: Diagnosis not present

## 2020-05-12 DIAGNOSIS — I10 Essential (primary) hypertension: Secondary | ICD-10-CM | POA: Diagnosis present

## 2020-05-12 DIAGNOSIS — Z951 Presence of aortocoronary bypass graft: Secondary | ICD-10-CM | POA: Diagnosis not present

## 2020-05-12 DIAGNOSIS — R109 Unspecified abdominal pain: Secondary | ICD-10-CM | POA: Diagnosis not present

## 2020-05-12 DIAGNOSIS — K219 Gastro-esophageal reflux disease without esophagitis: Secondary | ICD-10-CM | POA: Diagnosis not present

## 2020-05-12 DIAGNOSIS — R000 Tachycardia, unspecified: Secondary | ICD-10-CM | POA: Diagnosis not present

## 2020-05-12 DIAGNOSIS — F32A Depression, unspecified: Secondary | ICD-10-CM | POA: Diagnosis not present

## 2020-05-12 DIAGNOSIS — R197 Diarrhea, unspecified: Secondary | ICD-10-CM | POA: Diagnosis not present

## 2020-05-12 LAB — COMPREHENSIVE METABOLIC PANEL
ALT: 15 U/L (ref 0–44)
AST: 16 U/L (ref 15–41)
Albumin: 3.3 g/dL — ABNORMAL LOW (ref 3.5–5.0)
Alkaline Phosphatase: 100 U/L (ref 38–126)
Anion gap: 14 (ref 5–15)
BUN: 14 mg/dL (ref 8–23)
CO2: 21 mmol/L — ABNORMAL LOW (ref 22–32)
Calcium: 9.2 mg/dL (ref 8.9–10.3)
Chloride: 99 mmol/L (ref 98–111)
Creatinine, Ser: 0.65 mg/dL (ref 0.44–1.00)
GFR, Estimated: >60 mL/min (ref >60)
Glucose, Bld: 218 mg/dL — ABNORMAL HIGH (ref 70–99)
Potassium: 3.7 mmol/L (ref 3.5–5.1)
Sodium: 134 mmol/L — ABNORMAL LOW (ref 135–145)
Total Bilirubin: 0.4 mg/dL (ref 0.3–1.2)
Total Protein: 7 g/dL (ref 6.5–8.1)

## 2020-05-12 LAB — RESP PANEL BY RT-PCR (FLU A&B, COVID) ARPGX2
Influenza A by PCR: NEGATIVE
Influenza B by PCR: NEGATIVE
SARS Coronavirus 2 by RT PCR: NEGATIVE

## 2020-05-12 LAB — URINALYSIS, ROUTINE W REFLEX MICROSCOPIC
Bilirubin Urine: NEGATIVE
Glucose, UA: 50 mg/dL — AB
Ketones, ur: 20 mg/dL — AB
Leukocytes,Ua: NEGATIVE
Nitrite: NEGATIVE
Protein, ur: 30 mg/dL — AB
Specific Gravity, Urine: 1.024 (ref 1.005–1.030)
pH: 5 (ref 5.0–8.0)

## 2020-05-12 LAB — CBC
HCT: 41.2 % (ref 36.0–46.0)
Hemoglobin: 12.9 g/dL (ref 12.0–15.0)
MCH: 27.4 pg (ref 26.0–34.0)
MCHC: 31.3 g/dL (ref 30.0–36.0)
MCV: 87.5 fL (ref 80.0–100.0)
Platelets: 693 10*3/uL — ABNORMAL HIGH (ref 150–400)
RBC: 4.71 MIL/uL (ref 3.87–5.11)
RDW: 16.9 % — ABNORMAL HIGH (ref 11.5–15.5)
WBC: 28.3 10*3/uL — ABNORMAL HIGH (ref 4.0–10.5)
nRBC: 0 % (ref 0.0–0.2)

## 2020-05-12 LAB — MAGNESIUM: Magnesium: 2.2 mg/dL (ref 1.7–2.4)

## 2020-05-12 LAB — LIPASE, BLOOD: Lipase: 12 U/L (ref 11–51)

## 2020-05-12 LAB — HEMOGLOBIN A1C
Hgb A1c MFr Bld: 6.4 % — ABNORMAL HIGH (ref 4.8–5.6)
Mean Plasma Glucose: 136.98 mg/dL

## 2020-05-12 LAB — TROPONIN I (HIGH SENSITIVITY): Troponin I (High Sensitivity): 13 ng/L (ref <18)

## 2020-05-12 LAB — GLUCOSE, CAPILLARY: Glucose-Capillary: 98 mg/dL (ref 70–99)

## 2020-05-12 MED ORDER — ASPIRIN EC 81 MG PO TBEC
81.0000 mg | DELAYED_RELEASE_TABLET | Freq: Every day | ORAL | Status: DC
  Administered 2020-05-12 – 2020-05-17 (×6): 81 mg via ORAL
  Filled 2020-05-12 (×5): qty 1

## 2020-05-12 MED ORDER — IOHEXOL 9 MG/ML PO SOLN
ORAL | Status: AC
  Filled 2020-05-12: qty 1000

## 2020-05-12 MED ORDER — ROSUVASTATIN CALCIUM 20 MG PO TABS
40.0000 mg | ORAL_TABLET | Freq: Every day | ORAL | Status: DC
  Administered 2020-05-12 – 2020-05-17 (×6): 40 mg via ORAL
  Filled 2020-05-12 (×5): qty 2

## 2020-05-12 MED ORDER — CLOPIDOGREL BISULFATE 75 MG PO TABS
75.0000 mg | ORAL_TABLET | Freq: Every day | ORAL | Status: DC
  Administered 2020-05-12 – 2020-05-17 (×6): 75 mg via ORAL
  Filled 2020-05-12 (×6): qty 1

## 2020-05-12 MED ORDER — SODIUM CHLORIDE 0.9 % IV SOLN: INTRAVENOUS | Status: DC

## 2020-05-12 MED ORDER — RACEPINEPHRINE HCL 2.25 % IN NEBU: 0.5000 mL | INHALATION_SOLUTION | Freq: Once | RESPIRATORY_TRACT | Status: DC

## 2020-05-12 MED ORDER — INSULIN ASPART 100 UNIT/ML ~~LOC~~ SOLN: 0.0000 [IU] | Freq: Three times a day (TID) | SUBCUTANEOUS | Status: DC

## 2020-05-12 MED ORDER — ONDANSETRON HCL 4 MG PO TABS: 4.0000 mg | ORAL_TABLET | Freq: Four times a day (QID) | ORAL | Status: DC | PRN

## 2020-05-12 MED ORDER — SODIUM CHLORIDE 0.9 % IV BOLUS
1000.0000 mL | Freq: Once | INTRAVENOUS | Status: AC
  Administered 2020-05-12: 1000 mL via INTRAVENOUS

## 2020-05-12 MED ORDER — ONDANSETRON HCL 4 MG/2ML IJ SOLN
4.0000 mg | Freq: Once | INTRAMUSCULAR | Status: AC
  Administered 2020-05-12: 4 mg via INTRAVENOUS
  Filled 2020-05-12: qty 2

## 2020-05-12 MED ORDER — OXYBUTYNIN CHLORIDE ER 5 MG PO TB24
10.0000 mg | ORAL_TABLET | Freq: Every day | ORAL | Status: DC
  Administered 2020-05-12 – 2020-05-17 (×6): 10 mg via ORAL
  Filled 2020-05-12 (×5): qty 2

## 2020-05-12 MED ORDER — ISOSORBIDE DINITRATE 5 MG PO TABS
5.0000 mg | ORAL_TABLET | Freq: Three times a day (TID) | ORAL | Status: DC
  Administered 2020-05-12 – 2020-05-17 (×14): 5 mg via ORAL
  Filled 2020-05-12 (×21): qty 1

## 2020-05-12 MED ORDER — LORATADINE 10 MG PO TABS
10.0000 mg | ORAL_TABLET | Freq: Every day | ORAL | Status: DC
  Administered 2020-05-12 – 2020-05-17 (×6): 10 mg via ORAL
  Filled 2020-05-12 (×5): qty 1

## 2020-05-12 MED ORDER — ONDANSETRON HCL 4 MG/2ML IJ SOLN: 4.0000 mg | Freq: Four times a day (QID) | INTRAMUSCULAR | Status: DC | PRN

## 2020-05-12 MED ORDER — METOPROLOL TARTRATE 50 MG PO TABS
50.0000 mg | ORAL_TABLET | Freq: Two times a day (BID) | ORAL | Status: DC
  Administered 2020-05-12 – 2020-05-17 (×11): 50 mg via ORAL
  Filled 2020-05-12 (×10): qty 1

## 2020-05-12 MED ORDER — ENOXAPARIN SODIUM 40 MG/0.4ML ~~LOC~~ SOLN
40.0000 mg | SUBCUTANEOUS | Status: DC
  Administered 2020-05-12 – 2020-05-16 (×5): 40 mg via SUBCUTANEOUS
  Filled 2020-05-12 (×5): qty 0.4

## 2020-05-12 MED ORDER — IOHEXOL 300 MG/ML  SOLN
100.0000 mL | Freq: Once | INTRAMUSCULAR | Status: AC | PRN
  Administered 2020-05-12: 100 mL via INTRAVENOUS

## 2020-05-12 MED ORDER — SODIUM CHLORIDE 0.9 % IV BOLUS
500.0000 mL | Freq: Once | INTRAVENOUS | Status: AC
  Administered 2020-05-12: 500 mL via INTRAVENOUS

## 2020-05-12 MED ORDER — ACETAMINOPHEN 325 MG PO TABS
650.0000 mg | ORAL_TABLET | Freq: Four times a day (QID) | ORAL | Status: DC | PRN
  Administered 2020-05-13 – 2020-05-14 (×2): 650 mg via ORAL
  Filled 2020-05-12 (×2): qty 2

## 2020-05-12 MED ORDER — AMIODARONE HCL 200 MG PO TABS
200.0000 mg | ORAL_TABLET | Freq: Every day | ORAL | Status: DC
  Administered 2020-05-12 – 2020-05-17 (×6): 200 mg via ORAL
  Filled 2020-05-12 (×5): qty 1

## 2020-05-12 MED ORDER — TRAMADOL HCL 50 MG PO TABS
50.0000 mg | ORAL_TABLET | Freq: Three times a day (TID) | ORAL | Status: DC | PRN
  Administered 2020-05-12 – 2020-05-16 (×5): 50 mg via ORAL
  Filled 2020-05-12 (×5): qty 1

## 2020-05-12 MED ORDER — PANTOPRAZOLE SODIUM 40 MG PO TBEC
40.0000 mg | DELAYED_RELEASE_TABLET | Freq: Every day | ORAL | Status: DC
  Administered 2020-05-12 – 2020-05-17 (×6): 40 mg via ORAL
  Filled 2020-05-12 (×5): qty 1

## 2020-05-12 MED ORDER — ONDANSETRON HCL 4 MG/2ML IJ SOLN
4.0000 mg | Freq: Four times a day (QID) | INTRAMUSCULAR | Status: DC | PRN
  Administered 2020-05-14 – 2020-05-16 (×2): 4 mg via INTRAVENOUS
  Filled 2020-05-12 (×2): qty 2

## 2020-05-12 MED ORDER — INSULIN ASPART 100 UNIT/ML ~~LOC~~ SOLN
0.0000 [IU] | Freq: Three times a day (TID) | SUBCUTANEOUS | Status: DC
  Administered 2020-05-14 – 2020-05-15 (×3): 1 [IU] via SUBCUTANEOUS
  Administered 2020-05-15 – 2020-05-16 (×2): 2 [IU] via SUBCUTANEOUS
  Administered 2020-05-16: 1 [IU] via SUBCUTANEOUS

## 2020-05-12 MED ORDER — ALPRAZOLAM 0.25 MG PO TABS
0.2500 mg | ORAL_TABLET | Freq: Every evening | ORAL | Status: DC | PRN
  Administered 2020-05-13 – 2020-05-16 (×4): 0.25 mg via ORAL
  Filled 2020-05-12 (×4): qty 1

## 2020-05-12 MED ORDER — ZINC OXIDE 40 % EX PSTE
PASTE | CUTANEOUS | Status: DC | PRN
  Filled 2020-05-12: qty 57

## 2020-05-12 NOTE — ED Provider Notes (Signed)
[facility] EMERGENCY DEPARTMENT Provider Note   CSN: [id number] Arrival date & time: 05/12/20  0326   Time seen 4:00 AM  History Chief Complaint  Patient presents with  . Emesis   Level 5 caveat for confusion  Katrina Castro is a 74 y.o. female.  HPI   When I go in the room patient states she has been having nausea and vomiting however her daughter who is at bedside states it is not true.  She states that 2 weeks ago on the 24th patient had 5 vessel bypass.  Her hospital stay was prolonged due to diarrhea.  She was having 8-12 episodes a day for about 6 days.  She then developed a urinary tract infection that was only treatable by IV antibiotics which she finished the evening before she was discharged.  Her last episode of diarrhea was the 19th.  She was discharged home on the 22nd.  She had not had a bowel movement since the 19 and after she got home.  Since she has been home patient has had marked decreased appetite and saying that she felt bad.  She complained of weakness.  She started having some abdominal discomfort so her daughter gave her MiraLAX on the 24th around 2 PM.  Patient slept and daughter noted she looked very pale.  She woke up and her face looked red and her temperature was 100.8.  Daughter gave her some Tylenol.  At 7 PM she had a normal bowel movement.  Patient went back to sleep and did not eat.  Daughter states about 1:30 AM she woke up choking trying to cough up phlegm.  Her face was red and the patient complained of being extremely weak.  She did have one bowel movement that was "orange like spaghetti sauce", but not blood.  She told her daughter she was "too weak to move".  Her temperature at that time was 100.7.  PCP Glenda Chroman, MD   Past Medical History:  Diagnosis Date  . Arthritis   . Concussion    2015  . Depression   . Diabetes ([place])    type 2  . Dysphagia   . Dysrhythmia    afib  . GERD (gastroesophageal reflux disease)   . Headache   .  History of bronchitis   . Hypertension   . Seasonal allergies   . Toenail fungus     Patient Active Problem List   Diagnosis Date Noted  . S/P CABG x 5 04/27/2020  . Unstable angina ([place]) 04/25/2020  . Rectal bleeding 04/16/2019  . Hx of total knee arthroplasty, right 04/03/2017  . Other dysphagia 05/30/2016  . New onset a-fib ([place]) 05/18/2016  . Hypokalemia 05/18/2016  . Diabetes ([place]) 05/18/2016  . Atrial fibrillation ([place]) 05/18/2016  . Achalasia 05/15/2016  . Migraine without aura and without status migrainosus, not intractable 05/10/2015  . GERD (gastroesophageal reflux disease) 01/07/2012  . Dysphagia 11/26/2011    Past Surgical History:  Procedure Laterality Date  . APPENDECTOMY    . BACK SURGERY     x2  . BOTOX INJECTION N/A 06/06/2016   Procedure: BOTOX INJECTION;  Surgeon: Rogene Houston, MD;  [facility];  Service: Endoscopy;  Laterality: N/A;  . CHOLECYSTECTOMY    . COLONOSCOPY N/A 11/05/2012   Procedure: COLONOSCOPY;  Surgeon: Rogene Houston, MD;  [facility];  Service: Endoscopy;  Laterality: N/A;  1030  . COLONOSCOPY WITH PROPOFOL N/A 05/01/2019   Procedure: COLONOSCOPY WITH  PROPOFOL;  Surgeon: Rogene Houston, MD;  [facility];  Service: Endoscopy;  Laterality: N/A;  11:20am  . CORONARY ARTERY BYPASS GRAFT N/A 04/27/2020   Procedure: CORONARY ARTERY BYPASS GRAFTING (CABG) TIMES FIVE USING LEFT INTERNAL MAMMARY ARTERY, LEFT HARVESTED RADIAL ARTERY, RIGHT GREATER SAPHENOUS VEIN HARVESTED ENDOSCOPICALLY.;  Surgeon: [name], MD;  Location: [place];  Service: Open Heart Surgery;  Laterality: N/A;  . ESOPHAGEAL DILATION N/A 07/06/2015   Procedure: ESOPHAGEAL DILATION;  Surgeon: Rogene Houston, MD;  [facility];  Service: Endoscopy;  Laterality: N/A;  . ESOPHAGEAL DILATION N/A 06/06/2016   Procedure: ESOPHAGEAL DILATION;  Surgeon: Rogene Houston, MD;  [facility];  Service: Endoscopy;  Laterality:  N/A;  . ESOPHAGOGASTRODUODENOSCOPY N/A 02/12/2014   Procedure: ESOPHAGOGASTRODUODENOSCOPY (EGD);  Surgeon: Rogene Houston, MD;  [facility];  Service: Endoscopy;  Laterality: N/A;  150  . ESOPHAGOGASTRODUODENOSCOPY N/A 07/06/2015   Procedure: ESOPHAGOGASTRODUODENOSCOPY (EGD);  Surgeon: Rogene Houston, MD;  [facility];  Service: Endoscopy;  Laterality: N/A;  1:25 - moved to 1/18 @ 10:30 - Ann to notify pt  . ESOPHAGOGASTRODUODENOSCOPY (EGD) WITH ESOPHAGEAL DILATION N/A 07/25/2012   Procedure: ESOPHAGOGASTRODUODENOSCOPY (EGD) WITH ESOPHAGEAL DILATION;  Surgeon: Rogene Houston, MD;  [facility];  Service: Endoscopy;  Laterality: N/A;  325-rescheduled to [place] notified pt  . ESOPHAGOGASTRODUODENOSCOPY (EGD) WITH PROPOFOL N/A 06/06/2016   Procedure: ESOPHAGOGASTRODUODENOSCOPY (EGD) WITH PROPOFOL;  Surgeon: Rogene Houston, MD;  [facility];  Service: Endoscopy;  Laterality: N/A;  . EYE SURGERY     cataracts removed  . Foot surgeries Bilateral    hammer toes  . LEFT HEART CATH AND CORONARY ANGIOGRAPHY N/A 04/26/2020   Procedure: LEFT HEART CATH AND CORONARY ANGIOGRAPHY;  Surgeon: Martinique, Peter M, MD;  Location: [facility];  Service: Cardiovascular;  Laterality: N/A;  . Venia Minks DILATION N/A 02/12/2014   Procedure: Venia Minks DILATION;  Surgeon: Rogene Houston, MD;  [facility];  Service: Endoscopy;  Laterality: N/A;  . POLYPECTOMY  05/01/2019   Procedure: POLYPECTOMY;  Surgeon: Rogene Houston, MD;  [facility];  Service: Endoscopy;;  colon   . RADIAL ARTERY HARVEST Left 04/27/2020   Procedure: LEFT RADIAL ARTERY HARVEST;  Surgeon: [name], MD;  Location: [place];  Service: Open Heart Surgery;  Laterality: Left;  . Right knee arthroscopy     x2  . TEE WITHOUT CARDIOVERSION N/A 04/27/2020   Procedure: TRANSESOPHAGEAL ECHOCARDIOGRAM (TEE);  Surgeon: [name], MD;  Location: [place];  Service: Open Heart  Surgery;  Laterality: N/A;  . TONSILLECTOMY    . TOTAL ABDOMINAL HYSTERECTOMY    . TOTAL KNEE ARTHROPLASTY Right 04/03/2017   Procedure: RIGHT TOTAL KNEE ARTHROPLASTY;  Surgeon: Latanya Maudlin, MD;  Location: [facility];  Service: Orthopedics;  Laterality: Right;     OB History   No obstetric history on file.     Family History  Problem Relation Age of Onset  . Stroke Father   . Diabetes Mother   . Diabetes Sister   . Colon cancer Neg Hx     Social History   Tobacco Use  . Smoking status: Never Smoker  . Smokeless tobacco: Never Used  Vaping Use  . Vaping Use: Never used  Substance Use Topics  . Alcohol use: No    Alcohol/week: 0.0 standard drinks    Comment: socially   . Drug use: No  Home Medications Prior to Admission medications   Medication Sig Start Date End Date Taking? Authorizing Provider  acetaminophen (TYLENOL) 325 MG tablet Take 2 tablets (650 mg total) by mouth every 6 (six) hours as needed for mild pain. 05/10/20   Elgie Collard, PA-C  ALPRAZolam Duanne Moron) 0.5 MG tablet Take 0.25 mg by mouth at bedtime as needed for anxiety.  04/01/19   [provider]  amiodarone (PACERONE) 200 MG tablet Take 1 tablet (200 mg total) by mouth daily. 05/10/20   Elgie Collard, PA-C  amoxicillin (AMOXIL) 500 MG capsule Take 1,000 mg by mouth 2 (two) times daily. Before dental visits 01/25/20   [provider]  aspirin EC 81 MG EC tablet Take 1 tablet (81 mg total) by mouth daily. Swallow whole. 05/10/20   Elgie Collard, PA-C  cetirizine (ZYRTEC) 10 MG tablet Take 10 mg by mouth daily.    [provider]  clopidogrel (PLAVIX) 75 MG tablet Take 1 tablet (75 mg total) by mouth daily. 05/10/20   Elgie Collard, PA-C  glimepiride (AMARYL) 2 MG tablet Take 2 mg by mouth daily with breakfast.     [provider]  isosorbide dinitrate (ISORDIL) 5 MG tablet Take 1 tablet (5 mg total) by mouth 3 (three) times daily. 05/10/20   Elgie Collard, PA-C   metoprolol (LOPRESSOR) 50 MG tablet Take 50 mg by mouth 2 (two) times daily.     [provider]  oxybutynin (DITROPAN-XL) 10 MG 24 hr tablet Take 10 mg by mouth daily.    [provider]  pantoprazole (PROTONIX) 40 MG tablet Take 40 mg by mouth daily.    [provider]  rosuvastatin (CRESTOR) 40 MG tablet Take 1 tablet (40 mg total) by mouth daily. 05/10/20   Elgie Collard, PA-C  traMADol (ULTRAM) 50 MG tablet Take 1 tablet (50 mg total) by mouth every 8 (eight) hours as needed for moderate pain. 05/10/20   Elgie Collard, PA-C  Zinc Oxide (TRIPLE PASTE) 12.8 % ointment Apply topically as needed for irritation. 05/10/20   Elgie Collard, PA-C    Allergies    Levofloxacin, Cardizem [diltiazem], Sulfa antibiotics, Diazepam, and Lipitor [atorvastatin]  Review of Systems   Review of Systems  Unable to perform ROS: Mental status change    Physical Exam Updated Vital Signs BP (!) 130/55   Pulse 91   Temp 99.3 F (37.4 C) (Rectal)   Resp 20   Ht 5\' 3"  (1.6 m)   Wt 64.6 kg   SpO2 98%   BMI 25.23 kg/m   Physical Exam Vitals and nursing note reviewed.  Constitutional:      Appearance: Normal appearance. She is normal weight.  HENT:     Head: Normocephalic and atraumatic.     Right Ear: External ear normal.     Left Ear: External ear normal.  Eyes:     Extraocular Movements: Extraocular movements intact.     Pupils: Pupils are equal, round, and reactive to light.     Comments: Pale conjunctiva  Cardiovascular:     Rate and Rhythm: Normal rate and regular rhythm.     Pulses: Normal pulses.     Heart sounds: Normal heart sounds.  Pulmonary:     Effort: Pulmonary effort is normal. No respiratory distress.     Breath sounds: Normal breath sounds.  Abdominal:     General: Bowel sounds are normal.     Palpations: Abdomen is soft.  Tenderness: There is no abdominal tenderness.  Musculoskeletal:     Cervical back: Normal range of motion.     Right  lower leg: No edema.     Left lower leg: No edema.  Skin:    General: Skin is warm and dry.     Coloration: Skin is pale.     Comments: Surgical sites do not look infected.  Neurological:     General: No focal deficit present.     Mental Status: She is alert.     Cranial Nerves: No cranial nerve deficit.  Psychiatric:        Mood and Affect: Affect is flat.        Speech: Speech is delayed.        Behavior: Behavior is slowed.     ED Results / Procedures / Treatments   Labs (all labs ordered are listed, but only abnormal results are displayed) Results for orders placed or performed during the hospital encounter of 05/12/20  Culture, blood (routine x 2)   Specimen: BLOOD RIGHT HAND  Result Value Ref Range   Specimen Description BLOOD RIGHT HAND    Special Requests      BOTTLES DRAWN AEROBIC ONLY Blood Culture results may not be optimal due to an inadequate volume of blood received in culture bottles Performed at [facility], [address], [place], [place] [address]    Culture PENDING    Report Status PENDING   Culture, blood (routine x 2)   Specimen: BLOOD RIGHT HAND  Result Value Ref Range   Specimen Description BLOOD RIGHT HAND    Special Requests      BOTTLES DRAWN AEROBIC AND ANAEROBIC Blood Culture results may not be optimal due to an inadequate volume of blood received in culture bottles Performed at [facility], [address], [place], [place] [address]    Culture PENDING    Report Status PENDING   Lipase, blood  Result Value Ref Range   Lipase 12 11 - 51 U/L  Comprehensive metabolic panel  Result Value Ref Range   Sodium 134 (L) 135 - 145 mmol/L   Potassium 3.7 3.5 - 5.1 mmol/L   Chloride 99 98 - 111 mmol/L   CO2 21 (L) 22 - 32 mmol/L   Glucose, Bld 218 (H) 70 - 99 mg/dL   BUN 14 8 - 23 mg/dL   Creatinine, Ser 0.65 0.44 - 1.00 mg/dL   Calcium 9.2 8.9 - 10.3 mg/dL   Total Protein 7.0 6.5 - 8.1 g/dL   Albumin 3.3 (L) 3.5 - 5.0 g/dL   AST 16 15 - 41 U/L    ALT 15 0 - 44 U/L   Alkaline Phosphatase 100 38 - 126 U/L   Total Bilirubin 0.4 0.3 - 1.2 mg/dL   GFR, Estimated >60 >60 mL/min   Anion gap 14 5 - 15  CBC  Result Value Ref Range   WBC 28.3 (H) 4.0 - 10.5 K/uL   RBC 4.71 3.87 - 5.11 MIL/uL   Hemoglobin 12.9 12.0 - 15.0 g/dL   HCT 41.2 36 - 46 %   MCV 87.5 80.0 - 100.0 fL   MCH 27.4 26.0 - 34.0 pg   MCHC 31.3 30.0 - 36.0 g/dL   RDW 16.9 (H) 11.5 - 15.5 %   Platelets 693 (H) 150 - 400 K/uL   nRBC 0.0 0.0 - 0.2 %   Laboratory interpretation all normal except hyperglycemia, leukocytosis    EKG EKG Interpretation  Date/Time:  Thursday  May 12 2020 03:32:24 EST Ventricular Rate:  115 PR Interval:    QRS Duration: 70 QT Interval:  371 QTC Calculation: 514 R Axis:   1 Text Interpretation: Sinus tachycardia Nonspecific T abnrm, anterolateral leads Prolonged QT interval Since last tracing rate faster 09 May 2020 Confirmed by Rolland Porter [phone]) on 05/12/2020 5:01:54 AM   Radiology DG Chest Port 1 View  Result Date: 05/12/2020 CLINICAL DATA:  Cough, nausea and vomiting for 2 days. Recent bypass surgery EXAM: PORTABLE CHEST 1 VIEW COMPARISON:  05/04/2020 FINDINGS: Postoperative changes in the mediastinum. Shallow inspiration with linear atelectasis in the lung bases. Heart size is normal. No pleural effusions. No pneumothorax. Mediastinal contours appear intact. Degenerative changes in the spine and shoulders. Surgical clips in the right upper quadrant. IMPRESSION: Shallow inspiration with linear atelectasis in the lung bases. Electronically Signed   By: Lucienne Capers M.D.   On: 05/12/2020 05:18    Procedures Procedures (including critical care time)  Medications Ordered in ED Medications  sodium chloride 0.9 % bolus 500 mL (0 mLs Intravenous Stopped 05/12/20 0430)  ondansetron (ZOFRAN) injection 4 mg (4 mg Intravenous Given 05/12/20 0401)  sodium chloride 0.9 % bolus 1,000 mL (0 mLs Intravenous Stopped 05/12/20 0643)   ondansetron (ZOFRAN) injection 4 mg (4 mg Intravenous Given 05/12/20 0649)  sodium chloride 0.9 % bolus 1,000 mL (1,000 mLs Intravenous New Bag/Given 05/12/20 3013)    ED Course  I have reviewed the triage vital signs and the nursing notes.  Pertinent labs & imaging results that were available during my care of the patient were reviewed by me and considered in my medical decision making (see chart for details).    MDM Rules/Calculators/A&P                         Patient was given IV fluids.  She was given IV Zofran for her complaints of nausea although her daughter states she really was not vomiting.  Laboratory testing was ordered.  Recheck at 6:40 AM patient is abdomen remains soft.  Patient states her abdomen is just sore however she still has nausea.  Daughter states she was not having nausea and vomiting but patient said she was.  I will talk to the hospitalist about admission, she probably needs more IV fluids before she goes back home.  I am not sure why her white blood cell count is so high.  She was given more IV fluids, she has not produced a urine sample yet.  7:12 AM Dr. Darrick Meigs, hospitalist will admit.  He wants in and out cath to be done to obtain her urine.  Final Clinical Impression(s) / ED Diagnoses Final diagnoses:  Dehydration  Nausea  Weakness  Loss of appetite    Rx / DC Orders  Plan admission  Rolland Porter, MD, Barbette Or, MD 05/12/20 [phone]

## 2020-05-12 NOTE — ED Notes (Signed)
CT staff provided pt oral contrast. Pt tolerating well at this time.   Lab also at bedside attempting to obtain am blood draw.

## 2020-05-12 NOTE — H&P (Addendum)
TRH H&P    Patient Demographics:    Katrina Castro, is a 74 y.o. female  MRN: [id number]  DOB - Aug 09, 1945  Admit Date - 05/12/2020  Referring MD/NP/PA: Dr. Rolland Porter  Outpatient Primary MD for the patient is Glenda Chroman, MD  Patient coming from: Home  Chief complaint-abdominal pain, fever   HPI:    Katrina Castro  is a 74 y.o. female, with history of paroxysmal atrial fibrillation, diabetes mellitus type 2, hypertension, esophageal dysmotility who recently underwent CABG x5 on 04/27/2020.  Hospital course was complicated by diarrhea, UTI, patient was treated with IV cefepime for Pseudomonas UTI.  For diarrhea she was started on Imodium and Lomotil which was stopped.  C. difficile antigen was ordered however the sample could not be collected patient did not have bowel movement.  Patient was discharged home on 05/10/2020. Since patient came home patient has had poor appetite, also had fever with temperature 100.8, she did have 2 loose bowel movement yesterday which she describes as orange color stool. She also had nausea and vomiting at home.  She complains of abdominal pain.  She also complains of chest pain.  Patient came to ED for further evaluation. Denies shortness of breath. Denies dysuria In the ED lab work revealed leukocytosis with WBC 28,000.  She is afebrile    Review of systems:    In addition to the HPI above,   All other systems reviewed and are negative.    Past History of the following :    Past Medical History:  Diagnosis Date  . Arthritis   . Concussion    2015  . Depression   . Diabetes ([place])    type 2  . Dysphagia   . Dysrhythmia    afib  . GERD (gastroesophageal reflux disease)   . Headache   . History of bronchitis   . Hypertension   . Seasonal allergies   . Toenail fungus       Past Surgical History:  Procedure Laterality Date  . APPENDECTOMY    . BACK  SURGERY     x2  . BOTOX INJECTION N/A 06/06/2016   Procedure: BOTOX INJECTION;  Surgeon: Rogene Houston, MD;  [facility];  Service: Endoscopy;  Laterality: N/A;  . CHOLECYSTECTOMY    . COLONOSCOPY N/A 11/05/2012   Procedure: COLONOSCOPY;  Surgeon: Rogene Houston, MD;  [facility];  Service: Endoscopy;  Laterality: N/A;  1030  . COLONOSCOPY WITH PROPOFOL N/A 05/01/2019   Procedure: COLONOSCOPY WITH PROPOFOL;  Surgeon: Rogene Houston, MD;  [facility];  Service: Endoscopy;  Laterality: N/A;  11:20am  . CORONARY ARTERY BYPASS GRAFT N/A 04/27/2020   Procedure: CORONARY ARTERY BYPASS GRAFTING (CABG) TIMES FIVE USING LEFT INTERNAL MAMMARY ARTERY, LEFT HARVESTED RADIAL ARTERY, RIGHT GREATER SAPHENOUS VEIN HARVESTED ENDOSCOPICALLY.;  Surgeon: [name], MD;  Location: [place];  Service: Open Heart Surgery;  Laterality: N/A;  . ESOPHAGEAL DILATION N/A 07/06/2015   Procedure: ESOPHAGEAL DILATION;  Surgeon: Rogene Houston, MD;  [facility];  Service: Endoscopy;  Laterality: N/A;  . ESOPHAGEAL DILATION N/A 06/06/2016   Procedure: ESOPHAGEAL DILATION;  Surgeon: Rogene Houston, MD;  [facility];  Service: Endoscopy;  Laterality: N/A;  . ESOPHAGOGASTRODUODENOSCOPY N/A 02/12/2014   Procedure: ESOPHAGOGASTRODUODENOSCOPY (EGD);  Surgeon: Rogene Houston, MD;  [facility];  Service: Endoscopy;  Laterality: N/A;  150  . ESOPHAGOGASTRODUODENOSCOPY N/A 07/06/2015   Procedure: ESOPHAGOGASTRODUODENOSCOPY (EGD);  Surgeon: Rogene Houston, MD;  [facility];  Service: Endoscopy;  Laterality: N/A;  1:25 - moved to 1/18 @ 10:30 - Ann to notify pt  . ESOPHAGOGASTRODUODENOSCOPY (EGD) WITH ESOPHAGEAL DILATION N/A 07/25/2012   Procedure: ESOPHAGOGASTRODUODENOSCOPY (EGD) WITH ESOPHAGEAL DILATION;  Surgeon: Rogene Houston, MD;  [facility];  Service: Endoscopy;  Laterality: N/A;  325-rescheduled to [place] notified pt  .  ESOPHAGOGASTRODUODENOSCOPY (EGD) WITH PROPOFOL N/A 06/06/2016   Procedure: ESOPHAGOGASTRODUODENOSCOPY (EGD) WITH PROPOFOL;  Surgeon: Rogene Houston, MD;  [facility];  Service: Endoscopy;  Laterality: N/A;  . EYE SURGERY     cataracts removed  . Foot surgeries Bilateral    hammer toes  . LEFT HEART CATH AND CORONARY ANGIOGRAPHY N/A 04/26/2020   Procedure: LEFT HEART CATH AND CORONARY ANGIOGRAPHY;  Surgeon: Martinique, Peter M, MD;  Location: [place] [facility];  Service: Cardiovascular;  Laterality: N/A;  . Venia Minks DILATION N/A 02/12/2014   Procedure: Venia Minks DILATION;  Surgeon: Rogene Houston, MD;  [facility];  Service: Endoscopy;  Laterality: N/A;  . POLYPECTOMY  05/01/2019   Procedure: POLYPECTOMY;  Surgeon: Rogene Houston, MD;  [facility];  Service: Endoscopy;;  colon   . RADIAL ARTERY HARVEST Left 04/27/2020   Procedure: LEFT RADIAL ARTERY HARVEST;  Surgeon: [name], MD;  Location: [place];  Service: Open Heart Surgery;  Laterality: Left;  . Right knee arthroscopy     x2  . TEE WITHOUT CARDIOVERSION N/A 04/27/2020   Procedure: TRANSESOPHAGEAL ECHOCARDIOGRAM (TEE);  Surgeon: [name], MD;  Location: [place];  Service: Open Heart Surgery;  Laterality: N/A;  . TONSILLECTOMY    . TOTAL ABDOMINAL HYSTERECTOMY    . TOTAL KNEE ARTHROPLASTY Right 04/03/2017   Procedure: RIGHT TOTAL KNEE ARTHROPLASTY;  Surgeon: Latanya Maudlin, MD;  Location: [facility];  Service: Orthopedics;  Laterality: Right;      Social History:      Social History   Tobacco Use  . Smoking status: Never Smoker  . Smokeless tobacco: Never Used  Substance Use Topics  . Alcohol use: No    Alcohol/week: 0.0 standard drinks    Comment: socially        Family History :     Family History  Problem Relation Age of Onset  . Stroke Father   . Diabetes Mother   . Diabetes Sister   . Colon cancer Neg Hx       Home Medications:   Prior to Admission medications    Medication Sig Start Date End Date Taking? Authorizing Provider  acetaminophen (TYLENOL) 325 MG tablet Take 2 tablets (650 mg total) by mouth every 6 (six) hours as needed for mild pain. 05/10/20   Elgie Collard, PA-C  ALPRAZolam Duanne Moron) 0.5 MG tablet Take 0.25 mg by mouth at bedtime as needed for anxiety.  04/01/19   [provider]  amiodarone (PACERONE) 200 MG tablet Take 1 tablet (200 mg total) by mouth daily. 05/10/20   Elgie Collard,  PA-C  amoxicillin (AMOXIL) 500 MG capsule Take 1,000 mg by mouth 2 (two) times daily. Before dental visits 01/25/20   [provider]  aspirin EC 81 MG EC tablet Take 1 tablet (81 mg total) by mouth daily. Swallow whole. 05/10/20   Elgie Collard, PA-C  cetirizine (ZYRTEC) 10 MG tablet Take 10 mg by mouth daily.    [provider]  clopidogrel (PLAVIX) 75 MG tablet Take 1 tablet (75 mg total) by mouth daily. 05/10/20   Elgie Collard, PA-C  glimepiride (AMARYL) 2 MG tablet Take 2 mg by mouth daily with breakfast.     [provider]  isosorbide dinitrate (ISORDIL) 5 MG tablet Take 1 tablet (5 mg total) by mouth 3 (three) times daily. 05/10/20   Elgie Collard, PA-C  metoprolol (LOPRESSOR) 50 MG tablet Take 50 mg by mouth 2 (two) times daily.     [provider]  oxybutynin (DITROPAN-XL) 10 MG 24 hr tablet Take 10 mg by mouth daily.    [provider]  pantoprazole (PROTONIX) 40 MG tablet Take 40 mg by mouth daily.    [provider]  rosuvastatin (CRESTOR) 40 MG tablet Take 1 tablet (40 mg total) by mouth daily. 05/10/20   Elgie Collard, PA-C  traMADol (ULTRAM) 50 MG tablet Take 1 tablet (50 mg total) by mouth every 8 (eight) hours as needed for moderate pain. 05/10/20   Elgie Collard, PA-C  Zinc Oxide (TRIPLE PASTE) 12.8 % ointment Apply topically as needed for irritation. 05/10/20   Elgie Collard, PA-C     Allergies:     Allergies  Allergen Reactions  . Levofloxacin Anaphylaxis and Rash   . Cardizem [Diltiazem] Other (See Comments)    Patient states that she could not think, felt that she was in the twilight zone. Arm discomfort.  . Sulfa Antibiotics Itching  . Diazepam Nausea Only  . Lipitor [Atorvastatin] Other (See Comments)    Caused her body to be "out of whack"  - elevated sugar also.     Physical Exam:   Vitals  Blood pressure (!) 138/59, pulse 96, temperature 99.3 F (37.4 C), temperature source Rectal, resp. rate 17, height 5\' 3"  (1.6 m), weight 64.6 kg, SpO2 97 %.  1.  General: Appears in no acute distress  2. Psychiatric: Alert, oriented x3, intact insight and judgment  3. Neurologic: Cranial nerves II through XII grossly intact, no focal deficit noted  4. HEENMT:  Atraumatic normocephalic, extraocular muscles are intact  5. Respiratory : Clear to auscultation bilaterally, no wheezing or crackles auscultated  6. Cardiovascular : S1-S2, regular, no murmur auscultated  7. Gastrointestinal:  Abdomen is soft, mild epigastric tenderness to palpation, no rigidity or guarding  8. Skin:  No rashes noted  9.Musculoskeletal:  Midline scar noted in chest wall, no redness or  discharge noted.    Data Review:    CBC Recent Labs  Lab 05/05/20 0950 05/09/20 0139 05/12/20 0358  WBC 11.8* 12.9* 28.3*  HGB 10.9* 10.8* 12.9  HCT 33.9* 34.4* 41.2  PLT 554* 575* 693*  MCV 84.3 86.0 87.5  MCH 27.1 27.0 27.4  MCHC 32.2 31.4 31.3  RDW 15.7* 16.2* 16.9*   ------------------------------------------------------------------------------------------------------------------  Results for orders placed or performed during the hospital encounter of 05/12/20 (from the past 48 hour(s))  Urinalysis, Routine w reflex microscopic Urine, Clean Catch     Status: Abnormal   Collection Time: 05/12/20  3:40 AM  Result Value Ref Range  Color, Urine YELLOW YELLOW   APPearance CLEAR CLEAR   Specific Gravity, Urine 1.024 1.005 - 1.030   pH 5.0 5.0 - 8.0   Glucose,  UA 50 (A) NEGATIVE mg/dL   Hgb urine dipstick SMALL (A) NEGATIVE   Bilirubin Urine NEGATIVE NEGATIVE   Ketones, ur 20 (A) NEGATIVE mg/dL   Protein, ur 30 (A) NEGATIVE mg/dL   Nitrite NEGATIVE NEGATIVE   Leukocytes,Ua NEGATIVE NEGATIVE   RBC / HPF 0-5 0 - 5 RBC/hpf   WBC, UA 0-5 0 - 5 WBC/hpf   Bacteria, UA RARE (A) NONE SEEN   Squamous Epithelial / LPF 0-5 0 - 5    Comment: Performed at [facility], [address] [place], [place] [address]  Lipase, blood     Status: None   Collection Time: 05/12/20  3:58 AM  Result Value Ref Range   Lipase 12 11 - 51 U/L    Comment: Performed at [facility], [address] [place], [place] [address]  Comprehensive metabolic panel     Status: Abnormal   Collection Time: 05/12/20  3:58 AM  Result Value Ref Range   Sodium 134 (L) 135 - 145 mmol/L   Potassium 3.7 3.5 - 5.1 mmol/L   Chloride 99 98 - 111 mmol/L   CO2 21 (L) 22 - 32 mmol/L   Glucose, Bld 218 (H) 70 - 99 mg/dL    Comment: Glucose reference range applies only to samples taken after fasting for at least 8 hours.   BUN 14 8 - 23 mg/dL   Creatinine, Ser 0.65 0.44 - 1.00 mg/dL   Calcium 9.2 8.9 - 10.3 mg/dL   Total Protein 7.0 6.5 - 8.1 g/dL   Albumin 3.3 (L) 3.5 - 5.0 g/dL   AST 16 15 - 41 U/L   ALT 15 0 - 44 U/L   Alkaline Phosphatase 100 38 - 126 U/L   Total Bilirubin 0.4 0.3 - 1.2 mg/dL   GFR, Estimated >60 >60 mL/min    Comment: (NOTE) Calculated using the CKD-EPI Creatinine Equation (2021)    Anion gap 14 5 - 15    Comment: Performed at [facility], [address], [place], [place] [address]  CBC     Status: Abnormal   Collection Time: 05/12/20  3:58 AM  Result Value Ref Range   WBC 28.3 (H) 4.0 - 10.5 K/uL   RBC 4.71 3.87 - 5.11 MIL/uL   Hemoglobin 12.9 12.0 - 15.0 g/dL   HCT 41.2 36 - 46 %   MCV 87.5 80.0 - 100.0 fL   MCH 27.4 26.0 - 34.0 pg   MCHC 31.3 30.0 - 36.0 g/dL   RDW 16.9 (H) 11.5 - 15.5 %   Platelets 693 (H) 150 - 400 K/uL   nRBC 0.0 0.0 - 0.2 %     Comment: Performed at [facility], [address], [place], [place] [address]  Culture, blood (routine x 2)     Status: None (Preliminary result)   Collection Time: 05/12/20  5:07 AM   Specimen: BLOOD RIGHT HAND  Result Value Ref Range   Specimen Description BLOOD RIGHT HAND    Special Requests      BOTTLES DRAWN AEROBIC ONLY Blood Culture results may not be optimal due to an inadequate volume of blood received in culture bottles Performed at [facility] [address], [place], [place] [address]    Culture PENDING    Report Status PENDING   Culture, blood (routine x 2)  Status: None (Preliminary result)   Collection Time: 05/12/20  5:23 AM   Specimen: BLOOD RIGHT HAND  Result Value Ref Range   Specimen Description BLOOD RIGHT HAND    Special Requests      BOTTLES DRAWN AEROBIC AND ANAEROBIC Blood Culture results may not be optimal due to an inadequate volume of blood received in culture bottles Performed at [facility], [address], [place], [place] [address]    Culture PENDING    Report Status PENDING   Resp Panel by RT-PCR (Flu A&B, Covid) Nasopharyngeal Swab     Status: None   Collection Time: 05/12/20  6:17 AM   Specimen: Nasopharyngeal Swab; Nasopharyngeal(NP) swabs in vial transport medium  Result Value Ref Range   SARS Coronavirus 2 by RT PCR NEGATIVE NEGATIVE    Comment: (NOTE) SARS-CoV-2 target nucleic acids are NOT DETECTED.  The SARS-CoV-2 RNA is generally detectable in upper respiratory specimens during the acute phase of infection. The lowest concentration of SARS-CoV-2 viral copies this assay can detect is 138 copies/mL. A negative result does not preclude SARS-Cov-2 infection and should not be used as the sole basis for treatment or other patient management decisions. A negative result may occur with  improper specimen collection/handling, submission of specimen other than nasopharyngeal swab, presence of viral mutation(s) within the areas targeted by  this assay, and inadequate number of viral copies(<138 copies/mL). A negative result must be combined with clinical observations, patient history, and epidemiological information. The expected result is Negative.  Fact Sheet for Patients:  EntrepreneurPulse.com.au  Fact Sheet for Healthcare Providers:  IncredibleEmployment.be  This test is no t yet approved or cleared by the [place] FDA and  has been authorized for detection and/or diagnosis of SARS-CoV-2 by FDA under an Emergency Use Authorization (EUA). This EUA will remain  in effect (meaning this test can be used) for the duration of the COVID-19 declaration under Section 564(b)(1) of the Act, 21 U.S.C.section 360bbb-3(b)(1), unless the authorization is terminated  or revoked sooner.       Influenza A by PCR NEGATIVE NEGATIVE   Influenza B by PCR NEGATIVE NEGATIVE    Comment: (NOTE) The Xpert Xpress SARS-CoV-2/FLU/RSV plus assay is intended as an aid in the diagnosis of influenza from Nasopharyngeal swab specimens and should not be used as a sole basis for treatment. Nasal washings and aspirates are unacceptable for Xpert Xpress SARS-CoV-2/FLU/RSV testing.  Fact Sheet for Patients: EntrepreneurPulse.com.au  Fact Sheet for Healthcare Providers: IncredibleEmployment.be  This test is not yet approved or cleared by the [place] FDA and has been authorized for detection and/or diagnosis of SARS-CoV-2 by FDA under an Emergency Use Authorization (EUA). This EUA will remain in effect (meaning this test can be used) for the duration of the COVID-19 declaration under Section 564(b)(1) of the Act, 21 U.S.C. section 360bbb-3(b)(1), unless the authorization is terminated or revoked.  Performed at [facility], [address], [place], [place] [address]     Chemistries  Recent Labs  Lab 05/05/20 [phone] 05/06/20 0202 05/06/20 0950 05/09/20 0139  05/12/20 0358  NA 139 138  --  137 134*  K 3.4* 4.4  --  3.9 3.7  CL 107 108  --  103 99  CO2 24 20*  --  23 21*  GLUCOSE 114* 101*  --  186* 218*  BUN 12 11  --  8 14  CREATININE 0.67 0.65  --  0.68 0.65  CALCIUM 8.5* 8.6*  --  8.9 9.2  AST  --   --  25 21 16   ALT  --   --  22 17 15   ALKPHOS  --   --  83 75 100  BILITOT  --   --  0.4 0.5 0.4   ------------------------------------------------------------------------------------------------------------------  ------------------------------------------------------------------------------------------------------------------ GFR: Estimated Creatinine Clearance: 56.7 mL/min (by C-G formula based on SCr of 0.65 mg/dL). Liver Function Tests: Recent Labs  Lab 05/06/20 0950 05/09/20 0139 05/12/20 0358  AST 25 21 16   ALT 22 17 15   ALKPHOS 83 75 100  BILITOT 0.4 0.5 0.4  PROT 5.8* 5.5* 7.0  ALBUMIN 2.5* 2.4* 3.3*   Recent Labs  Lab 05/06/20 0950 05/12/20 0358  LIPASE 22 12  AMYLASE 15*  --     Recent Labs  Lab 05/09/20 1707 05/09/20 2140 05/10/20 0059 05/10/20 0605 05/10/20 1209  GLUCAP 106* 123* 176* 108* 126*     --------------------------------------------------------------------------------------------------------------- Urine analysis:    Component Value Date/Time   COLORURINE YELLOW 05/12/2020 0340   APPEARANCEUR CLEAR 05/12/2020 0340   LABSPEC 1.024 05/12/2020 0340   PHURINE 5.0 05/12/2020 0340   GLUCOSEU 50 (A) 05/12/2020 0340   HGBUR SMALL (A) 05/12/2020 0340   BILIRUBINUR NEGATIVE 05/12/2020 0340   KETONESUR 20 (A) 05/12/2020 0340   PROTEINUR 30 (A) 05/12/2020 0340   NITRITE NEGATIVE 05/12/2020 0340   LEUKOCYTESUR NEGATIVE 05/12/2020 0340      Imaging Results:    DG Chest Port 1 View  Result Date: 05/12/2020 CLINICAL DATA:  Cough, nausea and vomiting for 2 days. Recent bypass surgery EXAM: PORTABLE CHEST 1 VIEW COMPARISON:  05/04/2020 FINDINGS: Postoperative changes in the mediastinum. Shallow  inspiration with linear atelectasis in the lung bases. Heart size is normal. No pleural effusions. No pneumothorax. Mediastinal contours appear intact. Degenerative changes in the spine and shoulders. Surgical clips in the right upper quadrant. IMPRESSION: Shallow inspiration with linear atelectasis in the lung bases. Electronically Signed   By: Lucienne Capers M.D.   On: 05/12/2020 05:18    My personal review of EKG: Rhythm NSR, nonspecific T wave changes in anterolateral leads, QT prolongation   Assessment & Plan:    Active Problems:   Fever   1. Abdominal pain-patient had diarrhea during previous hospitalization and was prescribed Imodium/Lomotil.  Stool for C. difficile PCR could not be obtained as patient was unable to provide sample before discharge.  Now presenting with nausea and abdominal pain.  She also had fever at home though she is afebrile in the ED.  WBC is found to be elevated 28,000 which is up from previous WBC of 12.9 on 05/09/2020.  Will obtain CT abdomen/pelvis to rule out underlying colitis.  Start IV normal saline at 75 mill per hour. 2. Chest pain-likely musculoskeletal from midline scar in the chest wall from recent CABG.  EKG still shows anterolateral T wave changes.  Will obtain troponin x2.   3. CAD s/p CABG-continue home medication including aspirin, Plavix, Lipitor, isosorbide.  Continue metoprolol. 4. Diabetes mellitus type 2-hold glimepiride, start sensitive sliding scale insulin with NovoLog. 5. Paroxysmal atrial fibrillation-continue amiodarone, currently in normal sinus rhythm.  Patient's Xarelto was discontinued at the time of discharge after she had CABG 2 weeks ago. 6. Recent UTI-patient recently had Pseudomonas UTI, which was treated in the hospital with 5-day course of Maxipime.  Will obtain urine culture. 7. Prolonged QTc interval-we will check serum magnesium.  Continue monitoring on telemetry.    DVT Prophylaxis-   Lovenox   AM Labs Ordered, also  please review Full Orders  Family Communication: Admission,  patients condition and plan of care including tests being ordered have been discussed with the patient  who indicate understanding and agree with the plan and Code Status.  Code Status: Full code  Admission status: Inpatient :The appropriate admission status for this patient is INPATIENT. Inpatient status is judged to be reasonable and necessary in order to provide the required intensity of service to ensure the patient's safety. The patient's presenting symptoms, physical exam findings, and initial radiographic and laboratory data in the context of their chronic comorbidities is felt to place them at high risk for further clinical deterioration. Furthermore, it is not anticipated that the patient will be medically stable for discharge from the hospital within 2 midnights of admission. The following factors support the admission status of inpatient.     The patient's presenting symptoms include fever, abdominal pain The worrisome physical exam findings include abdominal tenderness. The initial radiographic and laboratory data are worrisome because of leukocytosis. The chronic co-morbidities include CAD, diabetes mellitus type 2.       * I certify that at the point of admission it is my clinical judgment that the patient will require inpatient hospital care spanning beyond 2 midnights from the point of admission due to high intensity of service, high risk for further deterioration and high frequency of surveillance required.*  Time spent in minutes : 60 minutes   Caylei Sperry S Dhruva Orndoff M.D

## 2020-05-12 NOTE — ED Triage Notes (Signed)
[facility] - pt c/o N/V x 2 days. Recently d/c'd from [facility] post CABG

## 2020-05-12 NOTE — ED Notes (Signed)
Per other ED RN performing in and out cath, pt noted to have small reddened area, stage 1 to right upper buttock. mepilex dressing applied. Pt tolerated well.

## 2020-05-12 NOTE — ED Notes (Signed)
Pt daughter [name] notified of admission and bed number per Pt request. Tammy contact information is [phone].

## 2020-05-13 ENCOUNTER — Inpatient Hospital Stay (HOSPITAL_COMMUNITY): Payer: Medicare Other

## 2020-05-13 DIAGNOSIS — R197 Diarrhea, unspecified: Secondary | ICD-10-CM | POA: Diagnosis not present

## 2020-05-13 DIAGNOSIS — K859 Acute pancreatitis without necrosis or infection, unspecified: Secondary | ICD-10-CM | POA: Diagnosis not present

## 2020-05-13 DIAGNOSIS — R110 Nausea: Secondary | ICD-10-CM | POA: Diagnosis not present

## 2020-05-13 DIAGNOSIS — R109 Unspecified abdominal pain: Secondary | ICD-10-CM

## 2020-05-13 LAB — COMPREHENSIVE METABOLIC PANEL
ALT: 14 U/L (ref 0–44)
AST: 19 U/L (ref 15–41)
Albumin: 2.6 g/dL — ABNORMAL LOW (ref 3.5–5.0)
Alkaline Phosphatase: 84 U/L (ref 38–126)
Anion gap: 10 (ref 5–15)
BUN: 9 mg/dL (ref 8–23)
CO2: 21 mmol/L — ABNORMAL LOW (ref 22–32)
Calcium: 8.5 mg/dL — ABNORMAL LOW (ref 8.9–10.3)
Chloride: 107 mmol/L (ref 98–111)
Creatinine, Ser: 0.49 mg/dL (ref 0.44–1.00)
GFR, Estimated: >60 mL/min (ref >60)
Glucose, Bld: 85 mg/dL (ref 70–99)
Potassium: 3.5 mmol/L (ref 3.5–5.1)
Sodium: 138 mmol/L (ref 135–145)
Total Bilirubin: 0.4 mg/dL (ref 0.3–1.2)
Total Protein: 5.6 g/dL — ABNORMAL LOW (ref 6.5–8.1)

## 2020-05-13 LAB — GLUCOSE, CAPILLARY
Glucose-Capillary: 115 mg/dL — ABNORMAL HIGH (ref 70–99)
Glucose-Capillary: 69 mg/dL — ABNORMAL LOW (ref 70–99)
Glucose-Capillary: 75 mg/dL (ref 70–99)
Glucose-Capillary: 91 mg/dL (ref 70–99)

## 2020-05-13 LAB — CBC
HCT: 34.7 % — ABNORMAL LOW (ref 36.0–46.0)
Hemoglobin: 10.5 g/dL — ABNORMAL LOW (ref 12.0–15.0)
MCH: 27.3 pg (ref 26.0–34.0)
MCHC: 30.3 g/dL (ref 30.0–36.0)
MCV: 90.1 fL (ref 80.0–100.0)
Platelets: 543 10*3/uL — ABNORMAL HIGH (ref 150–400)
RBC: 3.85 MIL/uL — ABNORMAL LOW (ref 3.87–5.11)
RDW: 17.1 % — ABNORMAL HIGH (ref 11.5–15.5)
WBC: 14 10*3/uL — ABNORMAL HIGH (ref 4.0–10.5)
nRBC: 0 % (ref 0.0–0.2)

## 2020-05-13 LAB — TROPONIN I (HIGH SENSITIVITY)
Troponin I (High Sensitivity): 9 ng/L (ref <18)
Troponin I (High Sensitivity): 9 ng/L (ref <18)

## 2020-05-13 LAB — CLOSTRIDIUM DIFFICILE BY PCR, REFLEXED: Toxigenic C. Difficile by PCR: POSITIVE — AB

## 2020-05-13 LAB — C DIFFICILE QUICK SCREEN W PCR REFLEX
C Diff antigen: POSITIVE — AB
C Diff toxin: NEGATIVE

## 2020-05-13 MED ORDER — DEXTROSE 50 % IV SOLN
INTRAVENOUS | Status: AC
  Administered 2020-05-13: 50 mL via INTRAVENOUS
  Filled 2020-05-13: qty 50

## 2020-05-13 MED ORDER — LACTATED RINGERS IV SOLN
INTRAVENOUS | Status: DC
  Administered 2020-05-14: 75 mL/h via INTRAVENOUS

## 2020-05-13 MED ORDER — GADOBUTROL 1 MMOL/ML IV SOLN
6.0000 mL | Freq: Once | INTRAVENOUS | Status: AC | PRN
  Administered 2020-05-13: 6 mL via INTRAVENOUS

## 2020-05-13 MED ORDER — DEXTROSE 50 % IV SOLN: 1.0000 | Freq: Once | INTRAVENOUS | Status: AC

## 2020-05-13 NOTE — Progress Notes (Signed)
Per patient and night shift rn, pt has not had any diarrhea since yesterday. No BM during the night. Patient states she is not in any pain or discomfort. Chest pain resolved. No new complaints at this time. Called daughter, [name] per pt request. Will continue to monitor.

## 2020-05-13 NOTE — Progress Notes (Signed)
[facility]  PROGRESS NOTE  Katrina Castro KVQ:[id number] DOB: 1946-02-08 DOA: 05/12/2020 PCP: Glenda Chroman, MD   Brief HPI:    74 y.o. female, with history of paroxysmal atrial fibrillation, diabetes mellitus type 2, hypertension, esophageal dysmotility who recently underwent CABG x5 on 04/27/2020.  Hospital course was complicated by diarrhea, UTI, patient was treated with IV cefepime for Pseudomonas UTI.  For diarrhea she was started on Imodium and Lomotil which was stopped.  C. difficile antigen was ordered however the sample could not be collected patient did not have bowel movement.  Patient was discharged home on 05/10/2020. Since patient came home patient has had poor appetite, also had fever with temperature 100.8, she did have 2 loose bowel movement yesterday which she describes as orange color stool. She also had nausea and vomiting at home.  She complains of abdominal pain.  She also complains of chest pain.  Patient came to ED for further evaluation.   Subjective   Patient seen and examined, MRI abdomen done this morning shows pancreatic inflammation.  Lipase is normal.  Patient states that she had multiple loose stools yesterday evening but no BM since 7 PM last night.   Assessment/Plan:     1. Abdominal pain-MRI abdomen shows pancreatic inflammation, would not rule out mass.  Recommend repeat MRI in 6 to 12 weeks.  Lipase is normal.  Will consult GI for further evaluation and recommendations.  Patient is eating her food without any complaints. 2. Chest pain-likely musculoskeletal, troponin x2 is unremarkable. 3. CAD s/p CABG-continue home medication including aspirin, Plavix, Lipitor, isosorbide, metoprolol. 4. Diabetes mellitus type 2-continue sliding scale insulin with NovoLog. 5. Paroxysmal atrial fibrillation-continue amiodarone.  Xarelto was discontinued time of discharge after patient had CABG 2 weeks ago. 6. Recent UTI-patient had recent Pseudomonas UTI, which  was treated with IV cefepime for 5 days.  Repeat urine culture is currently pending. 7. Leukocytosis-resolved, today WBC is 14,000.  It was 28,000 yesterday.  Unclear etiology.     COVID-19 Labs  No results for input(s): DDIMER, FERRITIN, LDH, CRP in the last 72 hours.  Lab Results  Component Value Date   SARSCOV2NAA NEGATIVE 05/12/2020   [place] NEGATIVE 04/25/2020   [place] NEGATIVE 04/29/2019     Scheduled medications:   . amiodarone  200 mg Oral Daily  . aspirin EC  81 mg Oral Daily  . clopidogrel  75 mg Oral Daily  . enoxaparin (LOVENOX) injection  40 mg Subcutaneous Q24H  . insulin aspart  0-9 Units Subcutaneous TID WC  . isosorbide dinitrate  5 mg Oral TID  . loratadine  10 mg Oral Daily  . metoprolol tartrate  50 mg Oral BID  . oxybutynin  10 mg Oral Daily  . pantoprazole  40 mg Oral Daily  . rosuvastatin  40 mg Oral Daily         CBG: Recent Labs  Lab 05/10/20 1209 05/12/20 1130 05/13/20 0053 05/13/20 0922 05/13/20 1132  GLUCAP 126* 98 75 69* 91    SpO2: 95 % O2 Flow Rate (L/min): 3 L/min    CBC: Recent Labs  Lab 05/09/20 0139 05/12/20 0358 05/13/20 0456  WBC 12.9* 28.3* 14.0*  HGB 10.8* 12.9 10.5*  HCT 34.4* 41.2 34.7*  MCV 86.0 87.5 90.1  PLT 575* 693* 543*    Basic Metabolic Panel: Recent Labs  Lab 05/09/20 0139 05/12/20 0358 05/12/20 0926 05/13/20 0456  NA 137 134*  --  138  K 3.9 3.7  --  3.5  CL 103 99  --  107  CO2 23 21*  --  21*  GLUCOSE 186* 218*  --  85  BUN 8 14  --  9  CREATININE 0.68 0.65  --  0.49  CALCIUM 8.9 9.2  --  8.5*  MG  --   --  2.2  --      Liver Function Tests: Recent Labs  Lab 05/09/20 0139 05/12/20 0358 05/13/20 0456  AST 21 16 19   ALT 17 15 14   ALKPHOS 75 100 84  BILITOT 0.5 0.4 0.4  PROT 5.5* 7.0 5.6*  ALBUMIN 2.4* 3.3* 2.6*     Antibiotics: Anti-infectives (From admission, onward)   None       DVT prophylaxis: Lovenox  Code Status: Full code  Family  Communication: No family at bedside   Consultants:    Procedures:      Objective   Vitals:   05/12/20 2114 05/13/20 0426 05/13/20 1111 05/13/20 1436  BP: 140/63 (!) 139/49 (!) 167/87 (!) 115/45  Pulse: 96 78 76 66  Resp: 17 16 18 18   Temp: 98.6 F (37 C) 98 F (36.7 C) 98.6 F (37 C) 97.8 F (36.6 C)  TempSrc: Oral  Oral Oral  SpO2: 100% 99% 100% 95%  Weight:      Height:        Intake/Output Summary (Last 24 hours) at 05/13/2020 1616 Last data filed at 05/13/2020 1300 Gross per 24 hour  Intake 1011.55 ml  Output 900 ml  Net 111.55 ml    11/24 1901 - 11/26 0700 In: 2271.6 [I.V.:771.6] Out: 900 [Urine:900]  Filed Weights   05/12/20 0329 05/12/20 1011  Weight: 64.6 kg 62.2 kg    Physical Examination:    General-appears in no acute distress  Heart-S1-S2, regular, no murmur auscultated  Lungs-clear to auscultation bilaterally, no wheezing or crackles auscultated  Abdomen-soft, mild tenderness in epigastric region  Extremities-no edema in the lower extremities  Neuro-alert, oriented x3, no focal deficit noted   Status is: Inpatient  Dispo: The patient is from: Home              Anticipated d/c is to: Home              Anticipated d/c date is: 05/14/2020              Patient currently not medically stable for discharge  Barrier to discharge-evaluation for epigastric pain      Data Reviewed:   Recent Results (from the past 240 hour(s))  Urine Culture     Status: Abnormal   Collection Time: 05/04/20  2:43 AM   Specimen: Urine, Clean Catch  Result Value Ref Range Status   Specimen Description URINE, CLEAN CATCH  Final   Special Requests   Final    NONE Performed at [facility], [address], [place], [place] [address]    Culture >=100,000 COLONIES/mL PSEUDOMONAS AERUGINOSA (A)  Final   Report Status 05/06/2020 FINAL  Final   Organism ID, Bacteria PSEUDOMONAS AERUGINOSA (A)  Final      Susceptibility   Pseudomonas aeruginosa  - MIC*    CEFTAZIDIME 2 SENSITIVE Sensitive     CIPROFLOXACIN 0.5 SENSITIVE Sensitive     GENTAMICIN <=1 SENSITIVE Sensitive     IMIPENEM 0.5 SENSITIVE Sensitive     PIP/TAZO <=4 SENSITIVE Sensitive     CEFEPIME 1 SENSITIVE Sensitive     * >=100,000 COLONIES/mL PSEUDOMONAS AERUGINOSA  Culture, blood (routine x 2)  Status: None (Preliminary result)   Collection Time: 05/12/20  5:07 AM   Specimen: BLOOD RIGHT HAND  Result Value Ref Range Status   Specimen Description BLOOD RIGHT HAND  Final   Special Requests   Final    BOTTLES DRAWN AEROBIC ONLY Blood Culture results may not be optimal due to an inadequate volume of blood received in culture bottles   Culture   Final    NO GROWTH 1 DAY Performed at [facility], [address], [place], [place] [address]    Report Status PENDING  Incomplete  Culture, blood (routine x 2)     Status: None (Preliminary result)   Collection Time: 05/12/20  5:23 AM   Specimen: BLOOD RIGHT HAND  Result Value Ref Range Status   Specimen Description BLOOD RIGHT HAND  Final   Special Requests   Final    BOTTLES DRAWN AEROBIC AND ANAEROBIC Blood Culture results may not be optimal due to an inadequate volume of blood received in culture bottles   Culture   Final    NO GROWTH 1 DAY Performed at [facility], [address] [place], [place] [address]    Report Status PENDING  Incomplete  Resp Panel by RT-PCR (Flu A&B, Covid) Nasopharyngeal Swab     Status: None   Collection Time: 05/12/20  6:17 AM   Specimen: Nasopharyngeal Swab; Nasopharyngeal(NP) swabs in vial transport medium  Result Value Ref Range Status   SARS Coronavirus 2 by RT PCR NEGATIVE NEGATIVE Final    Comment: (NOTE) SARS-CoV-2 target nucleic acids are NOT DETECTED.  The SARS-CoV-2 RNA is generally detectable in upper respiratory specimens during the acute phase of infection. The lowest concentration of SARS-CoV-2 viral copies this assay can detect is 138 copies/mL. A negative result  does not preclude SARS-Cov-2 infection and should not be used as the sole basis for treatment or other patient management decisions. A negative result may occur with  improper specimen collection/handling, submission of specimen other than nasopharyngeal swab, presence of viral mutation(s) within the areas targeted by this assay, and inadequate number of viral copies(<138 copies/mL). A negative result must be combined with clinical observations, patient history, and epidemiological information. The expected result is Negative.  Fact Sheet for Patients:  EntrepreneurPulse.com.au  Fact Sheet for Healthcare Providers:  IncredibleEmployment.be  This test is no t yet approved or cleared by the [place] FDA and  has been authorized for detection and/or diagnosis of SARS-CoV-2 by FDA under an Emergency Use Authorization (EUA). This EUA will remain  in effect (meaning this test can be used) for the duration of the COVID-19 declaration under Section 564(b)(1) of the Act, 21 U.S.C.section 360bbb-3(b)(1), unless the authorization is terminated  or revoked sooner.       Influenza A by PCR NEGATIVE NEGATIVE Final   Influenza B by PCR NEGATIVE NEGATIVE Final    Comment: (NOTE) The Xpert Xpress SARS-CoV-2/FLU/RSV plus assay is intended as an aid in the diagnosis of influenza from Nasopharyngeal swab specimens and should not be used as a sole basis for treatment. Nasal washings and aspirates are unacceptable for Xpert Xpress SARS-CoV-2/FLU/RSV testing.  Fact Sheet for Patients: EntrepreneurPulse.com.au  Fact Sheet for Healthcare Providers: IncredibleEmployment.be  This test is not yet approved or cleared by the [place] FDA and has been authorized for detection and/or diagnosis of SARS-CoV-2 by FDA under an Emergency Use Authorization (EUA). This EUA will remain in effect (meaning this test can be used) for  the duration of the COVID-19 declaration  under Section 564(b)(1) of the Act, 21 U.S.C. section 360bbb-3(b)(1), unless the authorization is terminated or revoked.  Performed at [facility], [address] [place], [place] [address]     Recent Labs  Lab 05/12/20 [phone]  LIPASE 12   BNP (last 3 results) Recent Labs    04/25/20 2341  BNP 34.7     Studies:  MR ABDOMEN W WO CONTRAST  Result Date: 05/13/2020 CLINICAL DATA:  Pancreatic lesion on CT EXAM: MRI ABDOMEN WITHOUT AND WITH CONTRAST TECHNIQUE: Multiplanar multisequence MR imaging of the abdomen was performed both before and after the administration of intravenous contrast. CONTRAST:  49mL GADAVIST GADOBUTROL 1 MMOL/ML IV SOLN COMPARISON:  CT abdomen/pelvis dated 05/12/2020 FINDINGS: Motion degraded images. Lower chest: Moderate left and small right pleural effusions. Associated lower lobe opacities, likely atelectasis. Hepatobiliary: Moderate to severe hepatic steatosis. Postcontrast evaluation of the liver is severely motion degraded, but there are no suspicious/enhancing hepatic lesions. Status post cholecystectomy. No intrahepatic ductal dilatation. Dilated common duct, measuring 11 mm (series 21/image 5), and mildly tapering at the ampulla, likely postsurgical. No choledocholithiasis is seen. Pancreas: Bilobed cystic lesion along the pancreatic body, measuring approximately 1.9 x 2.8 x 5.0 cm (series 7/image 22; series 21/image 9). No convincing enhancement following contrast administration, noting motion degradation. This appearance favors a benign pseudocyst. No pancreatic atrophy or ductal dilatation. Mild peripancreatic edema/inflammatory changes along the pancreatic tail (series 7/image 20), suggesting mild acute pancreatitis, although poorly evaluated due to motion degradation. Spleen:  Within normal limits. Adrenals/Urinary Tract:  Adrenal glands are within normal limits. Kidneys are within normal limits.  No hydronephrosis.  Stomach/Bowel: Stomach and visualized bowel are grossly unremarkable. Vascular/Lymphatic:  No evidence of abdominal aortic aneurysm. Other: Mild upper abdominal ascites along the pancreatic tail, as noted above. Musculoskeletal: No focal osseous lesions. IMPRESSION: Mild peripancreatic edema/inflammatory changes along the pancreatic tail, suggesting mild acute pancreatitis, although poorly evaluated due to motion degradation. Given the focal appearance and the normal laboratory evaluation, follow-up MR abdomen with/without contrast is suggested in 6-12 weeks to exclude an underlying lesion. 5.0 cm bilobed cystic lesion along the pancreatic body, favoring a benign pseudocyst. However, given limitations of the current evaluation, attention at the time of follow-up is suggested. Common duct measures 11 mm, likely postsurgical. No choledocholithiasis is seen. Moderate to severe hepatic steatosis. Moderate left and small right pleural effusions. Associated lower lobe opacities, likely atelectasis. Electronically Signed   By: Julian Hy M.D.   On: 05/13/2020 11:46   CT ABDOMEN PELVIS W CONTRAST  Result Date: 05/12/2020 CLINICAL DATA:  Nausea, vomiting, and fever for 2 days, recently discharged from hospital post CABG; past history diabetes mellitus, hypertension, appendectomy, hysterectomy, cholecystectomy, GERD EXAM: CT ABDOMEN AND PELVIS WITH CONTRAST TECHNIQUE: Multidetector CT imaging of the abdomen and pelvis was performed using the standard protocol following bolus administration of intravenous contrast. Sagittal and coronal MPR images reconstructed from axial data set. CONTRAST:  122mL OMNIPAQUE IOHEXOL 300 MG/ML SOLN IV. Dilute oral contrast. COMPARISON:  07/27/2010 FINDINGS: Lower chest: Small BILATERAL pleural effusions and bibasilar atelectasis greater on LEFT. Postsurgical changes of CABG. Minimal pericardial fluid. Hepatobiliary: Gallbladder surgically absent. Diffuse hepatic steatosis. No focal  hepatic mass lesion. Pancreas: Bilobed cystic mass identified at body of pancreas, smaller component more superiorly extending towards lesser sac 2.2 x 1.5 x 1.7 cm and slightly more caudally 3.1 x 2.4 x 3.1 cm. This appears new since 2012. Remainder of pancreas normal without additional mass, calcification or ductal dilatation. Edema is seen  adjacent to the distal pancreatic tail extending to the medial margin of spleen and adjacent to the proximal descending thoracic:. Spleen: Unremarkable Adrenals/Urinary Tract: Adrenal glands, kidneys, ureters, and bladder normal appearance Stomach/Bowel: Appendix surgically absent. Minimal sigmoid diverticulosis without evidence of diverticulitis. Questionable rectal wall thickening versus artifact from underdistention. Stomach decompressed. Remaining bowel loops unremarkable. Vascular/Lymphatic: Scattered mild atherosclerotic calcifications aorta, iliac arteries, and coronary arteries. Aorta normal caliber. Circumaortic LEFT renal vein. No adenopathy. Reproductive: Uterus surgically absent with nonvisualization of ovaries Other: No free air or free fluid.  No hernia. Musculoskeletal: Bones demineralized. IMPRESSION: Bilobed cystic mass at body of pancreas 3.1 x 2.4 x 3.1 cm and 2.2 x 1.5 x 1.7 cm, new since 2012. Differential diagnosis includes pseudocyst, cystic pancreatic neoplasm, necrotic pancreatic neoplasm, abscess; recommend follow-up non emergent MR imaging with and without contrast to characterize. Mild edema along distal pancreatic tail extending to splenic hilum, could represent distal pancreatitis, recommend correlation with serum lipase. Minimal sigmoid diverticulosis without evidence of diverticulitis. Questionable rectal wall thickening versus artifact from underdistention, recommend correlation with digital rectal exam and proctoscopy. Hepatic steatosis. Small BILATERAL pleural effusions and bibasilar atelectasis greater on LEFT. Aortic Atherosclerosis  (ICD10-I70.0). Electronically Signed   By: Lavonia Dana M.D.   On: 05/12/2020 12:34   MR 3D Recon At Scanner  Result Date: 05/13/2020 CLINICAL DATA:  Pancreatic lesion on CT EXAM: MRI ABDOMEN WITHOUT AND WITH CONTRAST TECHNIQUE: Multiplanar multisequence MR imaging of the abdomen was performed both before and after the administration of intravenous contrast. CONTRAST:  63mL GADAVIST GADOBUTROL 1 MMOL/ML IV SOLN COMPARISON:  CT abdomen/pelvis dated 05/12/2020 FINDINGS: Motion degraded images. Lower chest: Moderate left and small right pleural effusions. Associated lower lobe opacities, likely atelectasis. Hepatobiliary: Moderate to severe hepatic steatosis. Postcontrast evaluation of the liver is severely motion degraded, but there are no suspicious/enhancing hepatic lesions. Status post cholecystectomy. No intrahepatic ductal dilatation. Dilated common duct, measuring 11 mm (series 21/image 5), and mildly tapering at the ampulla, likely postsurgical. No choledocholithiasis is seen. Pancreas: Bilobed cystic lesion along the pancreatic body, measuring approximately 1.9 x 2.8 x 5.0 cm (series 7/image 22; series 21/image 9). No convincing enhancement following contrast administration, noting motion degradation. This appearance favors a benign pseudocyst. No pancreatic atrophy or ductal dilatation. Mild peripancreatic edema/inflammatory changes along the pancreatic tail (series 7/image 20), suggesting mild acute pancreatitis, although poorly evaluated due to motion degradation. Spleen:  Within normal limits. Adrenals/Urinary Tract:  Adrenal glands are within normal limits. Kidneys are within normal limits.  No hydronephrosis. Stomach/Bowel: Stomach and visualized bowel are grossly unremarkable. Vascular/Lymphatic:  No evidence of abdominal aortic aneurysm. Other: Mild upper abdominal ascites along the pancreatic tail, as noted above. Musculoskeletal: No focal osseous lesions. IMPRESSION: Mild peripancreatic  edema/inflammatory changes along the pancreatic tail, suggesting mild acute pancreatitis, although poorly evaluated due to motion degradation. Given the focal appearance and the normal laboratory evaluation, follow-up MR abdomen with/without contrast is suggested in 6-12 weeks to exclude an underlying lesion. 5.0 cm bilobed cystic lesion along the pancreatic body, favoring a benign pseudocyst. However, given limitations of the current evaluation, attention at the time of follow-up is suggested. Common duct measures 11 mm, likely postsurgical. No choledocholithiasis is seen. Moderate to severe hepatic steatosis. Moderate left and small right pleural effusions. Associated lower lobe opacities, likely atelectasis. Electronically Signed   By: Julian Hy M.D.   On: 05/13/2020 11:46   DG Chest Port 1 View  Result Date: 05/12/2020 CLINICAL DATA:  Cough, nausea and vomiting for  2 days. Recent bypass surgery EXAM: PORTABLE CHEST 1 VIEW COMPARISON:  05/04/2020 FINDINGS: Postoperative changes in the mediastinum. Shallow inspiration with linear atelectasis in the lung bases. Heart size is normal. No pleural effusions. No pneumothorax. Mediastinal contours appear intact. Degenerative changes in the spine and shoulders. Surgical clips in the right upper quadrant. IMPRESSION: Shallow inspiration with linear atelectasis in the lung bases. Electronically Signed   By: Lucienne Capers M.D.   On: 05/12/2020 05:18       [place]   [facility] If 7PM-7AM, please contact night-coverage at [email or web address], Office  [phone]   05/13/2020, 4:16 PM  LOS: 1 day

## 2020-05-13 NOTE — Plan of Care (Signed)

## 2020-05-13 NOTE — Consult Note (Addendum)
Maylon Peppers, M.D. Gastroenterology & Hepatology                                           Patient Name: Katrina Castro Account #: @[id number] @   MRN: [id number] Admission Date: 05/12/2020 Date of Evaluation:  05/13/2020 Time of Evaluation: 7:04 PM   Referring Physician: Oswald Hillock, MD  Chief Complaint:  Pancreatitis and diarrhea  HPI:  This is a 74 y.o. female with past medical history of depression, diabetes, A. fib, GERD, hypertension, previous history of C. difficile and recent diagnosis of severe coronary artery disease status post recent CABG x5 vessels on 04/27/2020, who came to the hospital after presenting worsening diarrhea.  Patient was found to have imaging alterations concerning for acute pancreatitis for which gastroenterology was also consulted.  Patient is confused and is not the best historian.  However, daughter is at bedside and provides most of the history.  The patient was recently hospitalized at [facility] after being found to have exertional chest pain, found to have significant coronary artery disease for which she underwent coronary artery bypass grafting x5 on 04/27/2020.  Her hospital stay was complicated by a UTI due to Pseudomonas that was treated with Maxipime, treatment for this was started on 05/04/2020.  However, the patient had complained of diarrhea since 05/01/2020 during her hospitalization.  She presented multiple episodes of watery bowel movements without blood.  The daughter states that she had multiple bowel movements at that time and she also had fecal incontinence.  Since her symptoms persistent during her hospital stay, a GI pathogen panel was ordered which was negative.  The patient was given Imodium with eventual improvement of her symptoms on the day of discharge from the hospital (05/10/2020).  However, the daughter states that for the last 2 days she has presented recurrent episodes of watery diarrhea in multiple occasions, along  with urgency and incontinence.  Notably, the patient received cefuroxime for surgical prophylaxis on 04/27/2020.  The patient reports having "abdominal pain and cramping mostly in the lower abdomen but also some gurgling in her left upper quadrant and back".  Daughter states that she was previously complaining of abdominal pain in her epigastric area.  The daughter states that she has noticed her mother is also confused and she has noticed she has been forgetful.  Denies having any nausea or vomiting, fever, chills, melena or hematochezia.  No weight loss.  Regarding The evaluation during the current hospitalization, the patient was found to have severely elevated leukocytosis of 28,000 with normal hemoglobin of 12.9 and platelets elevated of 693, CMP was remarkable for normal aminotransferases, total bilirubin and alkaline phosphatase, with hypoalbuminemia of 3.3, sodium 134 rest of electrolytes were within normal limits, as well as renal function.  Lipase was checked and was normal, it was 12.  Due to this a CT of the abdomen and pelvis with IV contrast was performed which showed a bilobed cystic mass in the body of the pancreas (lesser sac of 2.2 cm and larger sac of 3.1 cm), without any masses, calcification or ductal dilation, but presence of edema in the distal pancreatic tail.  There was no presence of biliary ductal dilation.  Due to this, an MRI with and without IV contrast was performed which showed again a bilobed cystic lesion in the pancreas measuring 2.8 x 5.0 cm without enhancement, possibly a  pseudocyst, no presence of pancreatic atrophy or ductal dilation, there was also presence of mild peripancreatic edema in the pancreatic tail; the test was affected by motion.  I personally reviewed these images.  Patient denies smoking or alcohol intake in the past.  Past Medical History: SEE CHRONIC ISSSUES: Past Medical History:  Diagnosis Date  . Arthritis   . Concussion    2015  . Depression    . Diabetes ([place])    type 2  . Dysphagia   . Dysrhythmia    afib  . GERD (gastroesophageal reflux disease)   . Headache   . History of bronchitis   . Hypertension   . Seasonal allergies   . Toenail fungus    Past Surgical History:  Past Surgical History:  Procedure Laterality Date  . APPENDECTOMY    . BACK SURGERY     x2  . BOTOX INJECTION N/A 06/06/2016   Procedure: BOTOX INJECTION;  Surgeon: Rogene Houston, MD;  [facility];  Service: Endoscopy;  Laterality: N/A;  . CHOLECYSTECTOMY    . COLONOSCOPY N/A 11/05/2012   Procedure: COLONOSCOPY;  Surgeon: Rogene Houston, MD;  [facility];  Service: Endoscopy;  Laterality: N/A;  1030  . COLONOSCOPY WITH PROPOFOL N/A 05/01/2019   Procedure: COLONOSCOPY WITH PROPOFOL;  Surgeon: Rogene Houston, MD;  [facility];  Service: Endoscopy;  Laterality: N/A;  11:20am  . CORONARY ARTERY BYPASS GRAFT N/A 04/27/2020   Procedure: CORONARY ARTERY BYPASS GRAFTING (CABG) TIMES FIVE USING LEFT INTERNAL MAMMARY ARTERY, LEFT HARVESTED RADIAL ARTERY, RIGHT GREATER SAPHENOUS VEIN HARVESTED ENDOSCOPICALLY.;  Surgeon: [name], MD;  Location: [place];  Service: Open Heart Surgery;  Laterality: N/A;  . ESOPHAGEAL DILATION N/A 07/06/2015   Procedure: ESOPHAGEAL DILATION;  Surgeon: Rogene Houston, MD;  [facility];  Service: Endoscopy;  Laterality: N/A;  . ESOPHAGEAL DILATION N/A 06/06/2016   Procedure: ESOPHAGEAL DILATION;  Surgeon: Rogene Houston, MD;  [facility];  Service: Endoscopy;  Laterality: N/A;  . ESOPHAGOGASTRODUODENOSCOPY N/A 02/12/2014   Procedure: ESOPHAGOGASTRODUODENOSCOPY (EGD);  Surgeon: Rogene Houston, MD;  [facility];  Service: Endoscopy;  Laterality: N/A;  150  . ESOPHAGOGASTRODUODENOSCOPY N/A 07/06/2015   Procedure: ESOPHAGOGASTRODUODENOSCOPY (EGD);  Surgeon: Rogene Houston, MD;  [facility];  Service: Endoscopy;  Laterality: N/A;  1:25 - moved to  1/18 @ 10:30 - Ann to notify pt  . ESOPHAGOGASTRODUODENOSCOPY (EGD) WITH ESOPHAGEAL DILATION N/A 07/25/2012   Procedure: ESOPHAGOGASTRODUODENOSCOPY (EGD) WITH ESOPHAGEAL DILATION;  Surgeon: Rogene Houston, MD;  [facility];  Service: Endoscopy;  Laterality: N/A;  325-rescheduled to [place] notified pt  . ESOPHAGOGASTRODUODENOSCOPY (EGD) WITH PROPOFOL N/A 06/06/2016   Procedure: ESOPHAGOGASTRODUODENOSCOPY (EGD) WITH PROPOFOL;  Surgeon: Rogene Houston, MD;  [facility];  Service: Endoscopy;  Laterality: N/A;  . EYE SURGERY     cataracts removed  . Foot surgeries Bilateral    hammer toes  . LEFT HEART CATH AND CORONARY ANGIOGRAPHY N/A 04/26/2020   Procedure: LEFT HEART CATH AND CORONARY ANGIOGRAPHY;  Surgeon: Martinique, Peter M, MD;  Location: [place] [facility];  Service: Cardiovascular;  Laterality: N/A;  . Venia Minks DILATION N/A 02/12/2014   Procedure: Venia Minks DILATION;  Surgeon: Rogene Houston, MD;  [facility];  Service: Endoscopy;  Laterality: N/A;  . POLYPECTOMY  05/01/2019   Procedure: POLYPECTOMY;  Surgeon: Rogene Houston, MD;  [facility];  Service:  Endoscopy;;  colon   . RADIAL ARTERY HARVEST Left 04/27/2020   Procedure: LEFT RADIAL ARTERY HARVEST;  Surgeon: [name], MD;  Location: [place];  Service: Open Heart Surgery;  Laterality: Left;  . Right knee arthroscopy     x2  . TEE WITHOUT CARDIOVERSION N/A 04/27/2020   Procedure: TRANSESOPHAGEAL ECHOCARDIOGRAM (TEE);  Surgeon: [name], MD;  Location: [place];  Service: Open Heart Surgery;  Laterality: N/A;  . TONSILLECTOMY    . TOTAL ABDOMINAL HYSTERECTOMY    . TOTAL KNEE ARTHROPLASTY Right 04/03/2017   Procedure: RIGHT TOTAL KNEE ARTHROPLASTY;  Surgeon: Latanya Maudlin, MD;  Location: [facility];  Service: Orthopedics;  Laterality: Right;   Family History:  Family History  Problem Relation Age of Onset  . Stroke Father   . Diabetes Mother   . Diabetes Sister   . Colon  cancer Neg Hx    Social History:  Social History   Tobacco Use  . Smoking status: Never Smoker  . Smokeless tobacco: Never Used  Vaping Use  . Vaping Use: Never used  Substance Use Topics  . Alcohol use: No    Alcohol/week: 0.0 standard drinks    Comment: socially   . Drug use: No    Home Medications:  Prior to Admission medications   Medication Sig Start Date End Date Taking? Authorizing Provider  acetaminophen (TYLENOL) 325 MG tablet Take 2 tablets (650 mg total) by mouth every 6 (six) hours as needed for mild pain. 05/10/20  Yes Conte, Tessa N, PA-C  ALPRAZolam Duanne Moron) 0.5 MG tablet Take 0.25 mg by mouth at bedtime as needed for anxiety.  04/01/19  Yes [provider]  amiodarone (PACERONE) 200 MG tablet Take 1 tablet (200 mg total) by mouth daily. 05/10/20  Yes Elgie Collard, PA-C  aspirin EC 81 MG EC tablet Take 1 tablet (81 mg total) by mouth daily. Swallow whole. 05/10/20  Yes Conte, Tessa N, PA-C  cetirizine (ZYRTEC) 10 MG tablet Take 10 mg by mouth daily.   Yes [provider]  clopidogrel (PLAVIX) 75 MG tablet Take 1 tablet (75 mg total) by mouth daily. 05/10/20  Yes Conte, Tessa N, PA-C  glimepiride (AMARYL) 2 MG tablet Take 2 mg by mouth daily with breakfast.    Yes [provider]  isosorbide dinitrate (ISORDIL) 5 MG tablet Take 1 tablet (5 mg total) by mouth 3 (three) times daily. 05/10/20  Yes Conte, Tessa N, PA-C  metoprolol (LOPRESSOR) 50 MG tablet Take 50 mg by mouth 2 (two) times daily.    Yes [provider]  oxybutynin (DITROPAN-XL) 10 MG 24 hr tablet Take 10 mg by mouth daily.   Yes [provider]  pantoprazole (PROTONIX) 40 MG tablet Take 40 mg by mouth daily.   Yes [provider]  rosuvastatin (CRESTOR) 40 MG tablet Take 1 tablet (40 mg total) by mouth daily. 05/10/20  Yes Conte, Tessa N, PA-C  traMADol (ULTRAM) 50 MG tablet Take 1 tablet (50 mg total) by mouth every 8 (eight) hours as needed for moderate  pain. 05/10/20  Yes Conte, Tessa N, PA-C  Zinc Oxide (TRIPLE PASTE) 12.8 % ointment Apply topically as needed for irritation. 05/10/20  Yes Conte, Tessa N, PA-C  amoxicillin (AMOXIL) 500 MG capsule Take 1,000 mg by mouth 2 (two) times daily. Before dental visits Patient not taking: Reported on 05/12/2020 01/25/20   [provider]    Inpatient Medications:  Current Facility-Administered Medications:  .  acetaminophen (TYLENOL)  tablet 650 mg, 650 mg, Oral, Q6H PRN, Oswald Hillock, MD, 650 mg at 05/13/20 1125 .  ALPRAZolam (XANAX) tablet 0.25 mg, 0.25 mg, Oral, QHS PRN, Darrick Meigs, Marge Duncans, MD, 0.25 mg at 05/13/20 1118 .  amiodarone (PACERONE) tablet 200 mg, 200 mg, Oral, Daily, Darrick Meigs, Marge Duncans, MD, 200 mg at 05/13/20 1118 .  aspirin EC tablet 81 mg, 81 mg, Oral, Daily, Oswald Hillock, MD, 81 mg at 05/13/20 1119 .  clopidogrel (PLAVIX) tablet 75 mg, 75 mg, Oral, Daily, Oswald Hillock, MD, 75 mg at 05/13/20 1118 .  enoxaparin (LOVENOX) injection 40 mg, 40 mg, Subcutaneous, Q24H, Darrick Meigs, Marge Duncans, MD, 40 mg at 05/13/20 1611 .  insulin aspart (novoLOG) injection 0-9 Units, 0-9 Units, Subcutaneous, TID WC, Lama, Gagan S, MD .  isosorbide dinitrate (ISORDIL) tablet 5 mg, 5 mg, Oral, TID, Darrick Meigs, Marge Duncans, MD, 5 mg at 05/13/20 1729 .  lactated ringers infusion, , Intravenous, Continuous, Montez Morita, Quillian Quince, MD, Last Rate: 75 mL/hr at 05/13/20 1537, New Bag at 05/13/20 1537 .  loratadine (CLARITIN) tablet 10 mg, 10 mg, Oral, Daily, Darrick Meigs, Marge Duncans, MD, 10 mg at 05/13/20 1120 .  metoprolol tartrate (LOPRESSOR) tablet 50 mg, 50 mg, Oral, BID, Darrick Meigs, Marge Duncans, MD, 50 mg at 05/13/20 1118 .  ondansetron (ZOFRAN) tablet 4 mg, 4 mg, Oral, Q6H PRN **OR** ondansetron (ZOFRAN) injection 4 mg, 4 mg, Intravenous, Q6H PRN, Darrick Meigs, Marge Duncans, MD .  oxybutynin (DITROPAN-XL) 24 hr tablet 10 mg, 10 mg, Oral, Daily, Darrick Meigs, Marge Duncans, MD, 10 mg at 05/13/20 1118 .  pantoprazole (PROTONIX) EC tablet 40 mg, 40 mg, Oral, Daily, Darrick Meigs, Marge Duncans, MD, 40 mg at 05/13/20 1119 .  rosuvastatin (CRESTOR) tablet 40 mg, 40 mg, Oral, Daily, Darrick Meigs, Marge Duncans, MD, 40 mg at 05/13/20 1119 .  traMADol (ULTRAM) tablet 50 mg, 50 mg, Oral, Q8H PRN, Oswald Hillock, MD, 50 mg at 05/13/20 0458 .  Zinc Oxide 40 % PSTE, , Topical, PRN, Darrick Meigs, Marge Duncans, MD Allergies: Levofloxacin, Cardizem [diltiazem], Sulfa antibiotics, Diazepam, and Lipitor [atorvastatin]  Complete Review of Systems: GENERAL: negative for malaise, night sweats HEENT: No changes in hearing or vision, no nose bleeds or other nasal problems. NECK: Negative for lumps, goiter, pain and significant neck swelling RESPIRATORY: Negative for cough, wheezing CARDIOVASCULAR: Negative for chest pain, leg swelling, palpitations, orthopnea GI: SEE HPI MUSCULOSKELETAL: Negative for joint pain or swelling, back pain, and muscle pain. SKIN: Negative for lesions, rash PSYCH: Negative for sleep disturbance, mood disorder and recent psychosocial stressors. HEMATOLOGY Negative for prolonged bleeding, bruising easily, and swollen nodes. ENDOCRINE: Negative for cold or heat intolerance, polyuria, polydipsia and goiter. NEURO: negative for tremor, gait imbalance, syncope and seizures. The remainder of the review of systems is noncontributory.  Physical Exam: BP (!) 115/45 (BP Location: Left Leg)   Pulse 66   Temp 97.8 F (36.6 C) (Oral)   Resp 18   Ht 5' 3"  (1.6 m)   Wt 62.2 kg   SpO2 95%   BMI 24.29 kg/m  GENERAL: The patient is AO x3 but is forgetful and repeats information constantly , in no acute distress. HEENT: Head is normocephalic and atraumatic. EOMI are intact. Mouth is well hydrated and without lesions. NECK: Supple. No masses LUNGS: Clear to auscultation. No presence of rhonchi/wheezing/rales. Adequate chest expansion HEART: RRR, normal s1 and s2. ABDOMEN: mildly tender upon palpation of her abdomen diffusely, no guarding, no peritoneal signs, and nondistended. BS +. No masses. EXTREMITIES:  Without any cyanosis, clubbing, rash, lesions or edema. NEUROLOGIC: AOx3, no focal motor deficit. SKIN: no jaundice, no rash  Laboratory Data CBC:     Component Value Date/Time   WBC 14.0 (H) 05/13/2020 0456   RBC 3.85 (L) 05/13/2020 0456   HGB 10.5 (L) 05/13/2020 0456   HGB 12.3 03/31/2020 1208   HCT 34.7 (L) 05/13/2020 0456   HCT 38.2 03/31/2020 1208   PLT 543 (H) 05/13/2020 0456   PLT 190 03/31/2020 1208   MCV 90.1 05/13/2020 0456   MCV 80 03/31/2020 1208   MCH 27.3 05/13/2020 0456   MCHC 30.3 05/13/2020 0456   RDW 17.1 (H) 05/13/2020 0456   RDW 13.1 03/31/2020 1208   LYMPHSABS 3.9 03/25/2017 1146   MONOABS 1.0 03/25/2017 1146   EOSABS 0.1 03/25/2017 1146   BASOSABS 0.0 03/25/2017 1146   COAG:  Lab Results  Component Value Date   INR 1.4 (H) 04/27/2020   INR 1.2 04/26/2020   INR 1.1 04/25/2020    BMP:  BMP Latest Ref Rng & Units 05/13/2020 05/12/2020 05/09/2020  Glucose 70 - 99 mg/dL 85 218(H) 186(H)  BUN 8 - 23 mg/dL 9 14 8   Creatinine 0.44 - 1.00 mg/dL 0.49 0.65 0.68  BUN/Creat Ratio 12 - 28 - - -  Sodium 135 - 145 mmol/L 138 134(L) 137  Potassium 3.5 - 5.1 mmol/L 3.5 3.7 3.9  Chloride 98 - 111 mmol/L 107 99 103  CO2 22 - 32 mmol/L 21(L) 21(L) 23  Calcium 8.9 - 10.3 mg/dL 8.5(L) 9.2 8.9    HEPATIC:  Hepatic Function Latest Ref Rng & Units 05/13/2020 05/12/2020 05/09/2020  Total Protein 6.5 - 8.1 g/dL 5.6(L) 7.0 5.5(L)  Albumin 3.5 - 5.0 g/dL 2.6(L) 3.3(L) 2.4(L)  AST 15 - 41 U/L 19 16 21   ALT 0 - 44 U/L 14 15 17   Alk Phosphatase 38 - 126 U/L 84 100 75  Total Bilirubin 0.3 - 1.2 mg/dL 0.4 0.4 0.5  Bilirubin, Direct 0.0 - 0.2 mg/dL - - -    CARDIAC:  Lab Results  Component Value Date   TROPONINI <0.03 05/19/2016     Imaging: I personally reviewed and interpreted the available imaging.  Assessment & Plan:  74 y.o. female with past medical history of depression, diabetes, A. fib, GERD, hypertension, previous history of C. difficile and recent  diagnosis of severe coronary artery disease status post recent CABG x5 vessels on 04/27/2020, admitted to hospital for new onset diarrhea.  The patient had previous episodes of diarrhea throughout her life that were self-limited but she has presented persistent watery diarrhea after her surgical procedure.  Given the timeline of her symptoms, it is important to rule out an infectious etiology as the cause of her diarrhea and exposure to antibiotics.  Disease, will check C. difficile and GI pathogen panel.  If these tests are negative, we will need to consider other causes of diarrhea and we will also consider checking stool small gap.  In the other hand, the patient was found to have imaging alterations suggestive of pancreatitis as there is a possible pseudocyst with fat stranding in the tail of the pancreas.  He did not have any elevation of her lipase but presented abdominal pain.  Her abdominal pain had atypical characteristics and the patient is a poor historian which limits the information obtained.  Nevertheless, we will investigate further for causes of pancreatitis with triglyceride levels.  Episode not rrlated to alcohol or stones. She will need to switch to  lactated Ringer for IV fluid resuscitation and advance diet as tolerated.  I do not consider the cause of her diarrhea is directly related to her possible pancreatitis as she does not have any signs of atrophy and her symptoms were acute in nature.  Both the daughter and patient understood and agreed.  # Acute diarrhea # Possible acute pancreatitis # Pancreatic pseudocyst - Check C. Diff and GI pathogen panel  - Avoid antidiarrheals for now - LR @ 75 cc/h - CLD, advance diet as tolerated - Pain control with morphine, avoid NSAIDs  Harvel Quale, MD Gastroenterology and Hepatology [facility]   Note: Occasional unusual wording and randomly placed punctuation marks may result from the use  of speech recognition technology to transcribe this document

## 2020-05-14 DIAGNOSIS — R197 Diarrhea, unspecified: Secondary | ICD-10-CM | POA: Diagnosis not present

## 2020-05-14 DIAGNOSIS — K863 Pseudocyst of pancreas: Secondary | ICD-10-CM | POA: Diagnosis not present

## 2020-05-14 DIAGNOSIS — R110 Nausea: Secondary | ICD-10-CM | POA: Diagnosis not present

## 2020-05-14 DIAGNOSIS — K8500 Idiopathic acute pancreatitis without necrosis or infection: Secondary | ICD-10-CM

## 2020-05-14 DIAGNOSIS — R109 Unspecified abdominal pain: Secondary | ICD-10-CM | POA: Diagnosis not present

## 2020-05-14 LAB — COMPREHENSIVE METABOLIC PANEL
ALT: 13 U/L (ref 0–44)
AST: 21 U/L (ref 15–41)
Albumin: 2.6 g/dL — ABNORMAL LOW (ref 3.5–5.0)
Alkaline Phosphatase: 90 U/L (ref 38–126)
Anion gap: 7 (ref 5–15)
BUN: 8 mg/dL (ref 8–23)
CO2: 25 mmol/L (ref 22–32)
Calcium: 8.5 mg/dL — ABNORMAL LOW (ref 8.9–10.3)
Chloride: 105 mmol/L (ref 98–111)
Creatinine, Ser: 0.52 mg/dL (ref 0.44–1.00)
GFR, Estimated: >60 mL/min (ref >60)
Glucose, Bld: 183 mg/dL — ABNORMAL HIGH (ref 70–99)
Potassium: 3 mmol/L — ABNORMAL LOW (ref 3.5–5.1)
Sodium: 137 mmol/L (ref 135–145)
Total Bilirubin: <0.1 mg/dL — ABNORMAL LOW (ref 0.3–1.2)
Total Protein: 5.6 g/dL — ABNORMAL LOW (ref 6.5–8.1)

## 2020-05-14 LAB — GASTROINTESTINAL PANEL BY PCR, STOOL (REPLACES STOOL CULTURE)

## 2020-05-14 LAB — URINE CULTURE: Culture: NO GROWTH

## 2020-05-14 LAB — CBC
HCT: 35.7 % — ABNORMAL LOW (ref 36.0–46.0)
Hemoglobin: 10.9 g/dL — ABNORMAL LOW (ref 12.0–15.0)
MCH: 27.2 pg (ref 26.0–34.0)
MCHC: 30.5 g/dL (ref 30.0–36.0)
MCV: 89 fL (ref 80.0–100.0)
Platelets: 531 10*3/uL — ABNORMAL HIGH (ref 150–400)
RBC: 4.01 MIL/uL (ref 3.87–5.11)
RDW: 16.8 % — ABNORMAL HIGH (ref 11.5–15.5)
WBC: 8.4 10*3/uL (ref 4.0–10.5)
nRBC: 0 % (ref 0.0–0.2)

## 2020-05-14 LAB — GLUCOSE, CAPILLARY
Glucose-Capillary: 90 mg/dL (ref 70–99)
Glucose-Capillary: 131 mg/dL — ABNORMAL HIGH (ref 70–99)

## 2020-05-14 LAB — TRIGLYCERIDES: Triglycerides: 131 mg/dL (ref <150)

## 2020-05-14 MED ORDER — VANCOMYCIN 50 MG/ML ORAL SOLUTION
500.0000 mg | Freq: Four times a day (QID) | ORAL | Status: DC
  Administered 2020-05-14 – 2020-05-17 (×11): 500 mg via ORAL
  Filled 2020-05-14 (×17): qty 10

## 2020-05-14 MED ORDER — ALUM & MAG HYDROXIDE-SIMETH 200-200-20 MG/5ML PO SUSP
30.0000 mL | ORAL | Status: DC | PRN
  Administered 2020-05-16: 30 mL via ORAL
  Filled 2020-05-14: qty 30

## 2020-05-14 MED ORDER — ISOSORBIDE DINITRATE 10 MG PO TABS
ORAL_TABLET | ORAL | Status: AC
  Filled 2020-05-14: qty 1

## 2020-05-14 NOTE — Progress Notes (Addendum)
[facility]  PROGRESS NOTE  Katrina Castro MBT:[id number] DOB: 1945-12-28 DOA: 05/12/2020 PCP: Glenda Chroman, MD   Brief HPI:    74 y.o. female, with history of paroxysmal atrial fibrillation, diabetes mellitus type 2, hypertension, esophageal dysmotility who recently underwent CABG x5 on 04/27/2020.  Hospital course was complicated by diarrhea, UTI, patient was treated with IV cefepime for Pseudomonas UTI.  For diarrhea she was started on Imodium and Lomotil which was stopped.  C. difficile antigen was ordered however the sample could not be collected patient did not have bowel movement.  Patient was discharged home on 05/10/2020. Since patient came home patient has had poor appetite, also had fever with temperature 100.8, she did have 2 loose bowel movement yesterday which she describes as orange color stool. She also had nausea and vomiting at home.  She complains of abdominal pain.  She also complains of chest pain.  Patient came to ED for further evaluation.   Subjective   Patient seen and examined, appreciate GI consultation.  CT PCR negative for toxin, positive for antigen.  Patient started on vancomycin per GI recommendation.   Assessment/Plan:     1. Abdominal pain-MRI abdomen shows pancreatic inflammation, could not rule out mass.  Recommend repeat MRI in 6 to 12 weeks.  Lipase is normal.  GI has been consulted. 2. ?  C. difficile colitis-patient has positive antigen however negative C. difficile toxin.  Patient started on vancomycin 500 mg p.o. every 6 hours as per GI recommendation. 3. Chest pain-likely musculoskeletal, troponin x2 is unremarkable. 4. CAD s/p CABG-continue home medication including aspirin, Plavix, Lipitor, isosorbide, metoprolol. 5. Diabetes mellitus type 2-continue sliding scale insulin with NovoLog. 6. Paroxysmal atrial fibrillation-continue amiodarone.  Xarelto was discontinued time of discharge after patient had CABG 2 weeks ago. 7. Recent  UTI-patient had recent Pseudomonas UTI, which was treated with IV cefepime for 5 days.  Repeat urine culture showed no growth. 8. Leukocytosis-resolved, WBC is 14,000.  It was 28,000 on day of admission.       COVID-19 Labs  No results for input(s): DDIMER, FERRITIN, LDH, CRP in the last 72 hours.  Lab Results  Component Value Date   SARSCOV2NAA NEGATIVE 05/12/2020   [place] NEGATIVE 04/25/2020   [place] NEGATIVE 04/29/2019     Scheduled medications:   . amiodarone  200 mg Oral Daily  . aspirin EC  81 mg Oral Daily  . clopidogrel  75 mg Oral Daily  . enoxaparin (LOVENOX) injection  40 mg Subcutaneous Q24H  . insulin aspart  0-9 Units Subcutaneous TID WC  . isosorbide dinitrate  5 mg Oral TID  . loratadine  10 mg Oral Daily  . metoprolol tartrate  50 mg Oral BID  . oxybutynin  10 mg Oral Daily  . pantoprazole  40 mg Oral Daily  . rosuvastatin  40 mg Oral Daily  . vancomycin  500 mg Oral QID         CBG: Recent Labs  Lab 05/13/20 0922 05/13/20 1132 05/13/20 1729 05/14/20 0849 05/14/20 1109  GLUCAP 69* 91 115* 90 131*    SpO2: 100 % O2 Flow Rate (L/min): 3 L/min    CBC: Recent Labs  Lab 05/09/20 0139 05/12/20 0358 05/13/20 0456  WBC 12.9* 28.3* 14.0*  HGB 10.8* 12.9 10.5*  HCT 34.4* 41.2 34.7*  MCV 86.0 87.5 90.1  PLT 575* 693* 543*    Basic Metabolic Panel: Recent Labs  Lab 05/09/20 0139 05/12/20 0358 05/12/20 0926 05/13/20 0456  NA  137 134*  --  138  K 3.9 3.7  --  3.5  CL 103 99  --  107  CO2 23 21*  --  21*  GLUCOSE 186* 218*  --  85  BUN 8 14  --  9  CREATININE 0.68 0.65  --  0.49  CALCIUM 8.9 9.2  --  8.5*  MG  --   --  2.2  --      Liver Function Tests: Recent Labs  Lab 05/09/20 0139 05/12/20 0358 05/13/20 0456  AST 21 16 19   ALT 17 15 14   ALKPHOS 75 100 84  BILITOT 0.5 0.4 0.4  PROT 5.5* 7.0 5.6*  ALBUMIN 2.4* 3.3* 2.6*     Antibiotics: Anti-infectives (From admission, onward)   Start     Dose/Rate Route  Frequency Ordered Stop   05/14/20 1400  vancomycin (VANCOCIN) 50 mg/mL oral solution 500 mg        500 mg Oral 4 times daily 05/14/20 1121 05/24/20 1359       DVT prophylaxis: Lovenox  Code Status: Full code  Family Communication: No family at bedside   Consultants:  Gastroenterology  Procedures:      Objective   Vitals:   05/13/20 0426 05/13/20 1111 05/13/20 1436 05/14/20 0457  BP: (!) 139/49 (!) 167/87 (!) 115/45 (!) 133/50  Pulse: 78 76 66 75  Resp: 16 18 18 18   Temp: 98 F (36.7 C) 98.6 F (37 C) 97.8 F (36.6 C) 98 F (36.7 C)  TempSrc:  Oral Oral   SpO2: 99% 100% 95% 100%  Weight:      Height:        Intake/Output Summary (Last 24 hours) at 05/14/2020 1449 Last data filed at 05/14/2020 0900 Gross per 24 hour  Intake 416.84 ml  Output 300 ml  Net 116.84 ml    11/25 1901 - 11/27 0700 In: 1187.7 [P.O.:240; I.V.:947.7] Out: 300 [Urine:300]  Filed Weights   05/12/20 0329 05/12/20 1011  Weight: 64.6 kg 62.2 kg    Physical Examination:    General-appears in no acute distress  Heart-S1-S2, regular, no murmur auscultated  Lungs-clear to auscultation bilaterally, no wheezing or crackles auscultated  Abdomen-soft, nontender, no organomegaly  Extremities-no edema in the lower extremities  Neuro-alert, oriented x3, no focal deficit noted   Status is: Inpatient  Dispo: The patient is from: Home              Anticipated d/c is to: Home              Anticipated d/c date is: 05/16/2020              Patient currently not medically stable for discharge  Barrier to discharge-evaluation for epigastric pain      Data Reviewed:   Recent Results (from the past 240 hour(s))  Urine culture     Status: None   Collection Time: 05/12/20  4:53 AM   Specimen: Urine, Clean Catch  Result Value Ref Range Status   Specimen Description   Final    URINE, CLEAN CATCH Performed at [facility], [address] [place], [place] [address]    Special  Requests   Final    NONE Performed at [facility], [address], [place], [place] [address]    Culture   Final    NO GROWTH Performed at [facility], [place] [address], [place],  [address]    Report Status 05/14/2020 FINAL  Final  Culture,  blood (routine x 2)     Status: None (Preliminary result)   Collection Time: 05/12/20  5:07 AM   Specimen: BLOOD RIGHT HAND  Result Value Ref Range Status   Specimen Description BLOOD RIGHT HAND  Final   Special Requests   Final    BOTTLES DRAWN AEROBIC ONLY Blood Culture results may not be optimal due to an inadequate volume of blood received in culture bottles   Culture   Final    NO GROWTH 2 DAYS Performed at [facility], [address], [place], [place] [address]    Report Status PENDING  Incomplete  Culture, blood (routine x 2)     Status: None (Preliminary result)   Collection Time: 05/12/20  5:23 AM   Specimen: BLOOD RIGHT HAND  Result Value Ref Range Status   Specimen Description BLOOD RIGHT HAND  Final   Special Requests   Final    BOTTLES DRAWN AEROBIC AND ANAEROBIC Blood Culture results may not be optimal due to an inadequate volume of blood received in culture bottles   Culture   Final    NO GROWTH 2 DAYS Performed at [facility], [address] [place], [place] [address]    Report Status PENDING  Incomplete  Resp Panel by RT-PCR (Flu A&B, Covid) Nasopharyngeal Swab     Status: None   Collection Time: 05/12/20  6:17 AM   Specimen: Nasopharyngeal Swab; Nasopharyngeal(NP) swabs in vial transport medium  Result Value Ref Range Status   SARS Coronavirus 2 by RT PCR NEGATIVE NEGATIVE Final    Comment: (NOTE) SARS-CoV-2 target nucleic acids are NOT DETECTED.  The SARS-CoV-2 RNA is generally detectable in upper respiratory specimens during the acute phase of infection. The lowest concentration of SARS-CoV-2 viral copies this assay can detect is 138 copies/mL. A negative result does not preclude SARS-Cov-2 infection  and should not be used as the sole basis for treatment or other patient management decisions. A negative result may occur with  improper specimen collection/handling, submission of specimen other than nasopharyngeal swab, presence of viral mutation(s) within the areas targeted by this assay, and inadequate number of viral copies(<138 copies/mL). A negative result must be combined with clinical observations, patient history, and epidemiological information. The expected result is Negative.  Fact Sheet for Patients:  EntrepreneurPulse.com.au  Fact Sheet for Healthcare Providers:  IncredibleEmployment.be  This test is no t yet approved or cleared by the [place] FDA and  has been authorized for detection and/or diagnosis of SARS-CoV-2 by FDA under an Emergency Use Authorization (EUA). This EUA will remain  in effect (meaning this test can be used) for the duration of the COVID-19 declaration under Section 564(b)(1) of the Act, 21 U.S.C.section 360bbb-3(b)(1), unless the authorization is terminated  or revoked sooner.       Influenza A by PCR NEGATIVE NEGATIVE Final   Influenza B by PCR NEGATIVE NEGATIVE Final    Comment: (NOTE) The Xpert Xpress SARS-CoV-2/FLU/RSV plus assay is intended as an aid in the diagnosis of influenza from Nasopharyngeal swab specimens and should not be used as a sole basis for treatment. Nasal washings and aspirates are unacceptable for Xpert Xpress SARS-CoV-2/FLU/RSV testing.  Fact Sheet for Patients: EntrepreneurPulse.com.au  Fact Sheet for Healthcare Providers: IncredibleEmployment.be  This test is not yet approved or cleared by the [place] FDA and has been authorized for detection and/or diagnosis of SARS-CoV-2 by FDA under an Emergency Use Authorization (EUA). This EUA will remain in effect (meaning this test can be used)  for the duration of the COVID-19 declaration  under Section 564(b)(1) of the Act, 21 U.S.C. section 360bbb-3(b)(1), unless the authorization is terminated or revoked.  Performed at [facility], [address] [place], [place] [address]   C Difficile Quick Screen w PCR reflex     Status: Abnormal   Collection Time: 05/13/20  4:19 PM   Specimen: STOOL  Result Value Ref Range Status   C Diff antigen POSITIVE (A) NEGATIVE Final   C Diff toxin NEGATIVE NEGATIVE Final   C Diff interpretation Results are indeterminate. See PCR results.  Final    Comment: Performed at [facility], [address], [place], [place] [address]  C. Diff by PCR, Reflexed     Status: Abnormal   Collection Time: 05/13/20  4:19 PM  Result Value Ref Range Status   Toxigenic C. Difficile by PCR POSITIVE (A) NEGATIVE Final    Comment: Positive for toxigenic C. difficile with little to no toxin production. Only treat if clinical presentation suggests symptomatic illness. Performed at [facility], [place] [address], [place], [place] [address]   Gastrointestinal Panel by PCR , Stool     Status: None   Collection Time: 05/13/20  4:20 PM   Specimen: STOOL  Result Value Ref Range Status   Campylobacter species NOT DETECTED NOT DETECTED Final   Plesimonas shigelloides NOT DETECTED NOT DETECTED Final   Salmonella species NOT DETECTED NOT DETECTED Final   Yersinia enterocolitica NOT DETECTED NOT DETECTED Final   Vibrio species NOT DETECTED NOT DETECTED Final   Vibrio cholerae NOT DETECTED NOT DETECTED Final   Enteroaggregative E coli (EAEC) NOT DETECTED NOT DETECTED Final   Enteropathogenic E coli (EPEC) NOT DETECTED NOT DETECTED Final   Enterotoxigenic E coli (ETEC) NOT DETECTED NOT DETECTED Final   Shiga like toxin producing E coli (STEC) NOT DETECTED NOT DETECTED Final   Shigella/Enteroinvasive E coli (EIEC) NOT DETECTED NOT DETECTED Final   Cryptosporidium NOT DETECTED NOT DETECTED Final   Cyclospora cayetanensis NOT DETECTED NOT DETECTED Final   Entamoeba  histolytica NOT DETECTED NOT DETECTED Final   Giardia lamblia NOT DETECTED NOT DETECTED Final   Adenovirus F40/41 NOT DETECTED NOT DETECTED Final   Astrovirus NOT DETECTED NOT DETECTED Final   Norovirus GI/GII NOT DETECTED NOT DETECTED Final   Rotavirus A NOT DETECTED NOT DETECTED Final   Sapovirus (I, II, IV, and V) NOT DETECTED NOT DETECTED Final    Comment: Performed at [facility], [address] [place], [place] [address]    Recent Labs  Lab 05/12/20 0358  LIPASE 12   BNP (last 3 results) Recent Labs    04/25/20 2341  BNP 34.7     Studies:  MR ABDOMEN W WO CONTRAST  Result Date: 05/13/2020 CLINICAL DATA:  Pancreatic lesion on CT EXAM: MRI ABDOMEN WITHOUT AND WITH CONTRAST TECHNIQUE: Multiplanar multisequence MR imaging of the abdomen was performed both before and after the administration of intravenous contrast. CONTRAST:  52mL GADAVIST GADOBUTROL 1 MMOL/ML IV SOLN COMPARISON:  CT abdomen/pelvis dated 05/12/2020 FINDINGS: Motion degraded images. Lower chest: Moderate left and small right pleural effusions. Associated lower lobe opacities, likely atelectasis. Hepatobiliary: Moderate to severe hepatic steatosis. Postcontrast evaluation of the liver is severely motion degraded, but there are no suspicious/enhancing hepatic lesions. Status post cholecystectomy. No intrahepatic ductal dilatation. Dilated common duct, measuring 11 mm (series 21/image 5), and mildly tapering at the ampulla, likely postsurgical. No choledocholithiasis is seen. Pancreas: Bilobed cystic lesion along the pancreatic body,  measuring approximately 1.9 x 2.8 x 5.0 cm (series 7/image 22; series 21/image 9). No convincing enhancement following contrast administration, noting motion degradation. This appearance favors a benign pseudocyst. No pancreatic atrophy or ductal dilatation. Mild peripancreatic edema/inflammatory changes along the pancreatic tail (series 7/image 20), suggesting mild acute  pancreatitis, although poorly evaluated due to motion degradation. Spleen:  Within normal limits. Adrenals/Urinary Tract:  Adrenal glands are within normal limits. Kidneys are within normal limits.  No hydronephrosis. Stomach/Bowel: Stomach and visualized bowel are grossly unremarkable. Vascular/Lymphatic:  No evidence of abdominal aortic aneurysm. Other: Mild upper abdominal ascites along the pancreatic tail, as noted above. Musculoskeletal: No focal osseous lesions. IMPRESSION: Mild peripancreatic edema/inflammatory changes along the pancreatic tail, suggesting mild acute pancreatitis, although poorly evaluated due to motion degradation. Given the focal appearance and the normal laboratory evaluation, follow-up MR abdomen with/without contrast is suggested in 6-12 weeks to exclude an underlying lesion. 5.0 cm bilobed cystic lesion along the pancreatic body, favoring a benign pseudocyst. However, given limitations of the current evaluation, attention at the time of follow-up is suggested. Common duct measures 11 mm, likely postsurgical. No choledocholithiasis is seen. Moderate to severe hepatic steatosis. Moderate left and small right pleural effusions. Associated lower lobe opacities, likely atelectasis. Electronically Signed   By: Julian Hy M.D.   On: 05/13/2020 11:46   MR 3D Recon At Scanner  Result Date: 05/13/2020 CLINICAL DATA:  Pancreatic lesion on CT EXAM: MRI ABDOMEN WITHOUT AND WITH CONTRAST TECHNIQUE: Multiplanar multisequence MR imaging of the abdomen was performed both before and after the administration of intravenous contrast. CONTRAST:  48mL GADAVIST GADOBUTROL 1 MMOL/ML IV SOLN COMPARISON:  CT abdomen/pelvis dated 05/12/2020 FINDINGS: Motion degraded images. Lower chest: Moderate left and small right pleural effusions. Associated lower lobe opacities, likely atelectasis. Hepatobiliary: Moderate to severe hepatic steatosis. Postcontrast evaluation of the liver is severely motion  degraded, but there are no suspicious/enhancing hepatic lesions. Status post cholecystectomy. No intrahepatic ductal dilatation. Dilated common duct, measuring 11 mm (series 21/image 5), and mildly tapering at the ampulla, likely postsurgical. No choledocholithiasis is seen. Pancreas: Bilobed cystic lesion along the pancreatic body, measuring approximately 1.9 x 2.8 x 5.0 cm (series 7/image 22; series 21/image 9). No convincing enhancement following contrast administration, noting motion degradation. This appearance favors a benign pseudocyst. No pancreatic atrophy or ductal dilatation. Mild peripancreatic edema/inflammatory changes along the pancreatic tail (series 7/image 20), suggesting mild acute pancreatitis, although poorly evaluated due to motion degradation. Spleen:  Within normal limits. Adrenals/Urinary Tract:  Adrenal glands are within normal limits. Kidneys are within normal limits.  No hydronephrosis. Stomach/Bowel: Stomach and visualized bowel are grossly unremarkable. Vascular/Lymphatic:  No evidence of abdominal aortic aneurysm. Other: Mild upper abdominal ascites along the pancreatic tail, as noted above. Musculoskeletal: No focal osseous lesions. IMPRESSION: Mild peripancreatic edema/inflammatory changes along the pancreatic tail, suggesting mild acute pancreatitis, although poorly evaluated due to motion degradation. Given the focal appearance and the normal laboratory evaluation, follow-up MR abdomen with/without contrast is suggested in 6-12 weeks to exclude an underlying lesion. 5.0 cm bilobed cystic lesion along the pancreatic body, favoring a benign pseudocyst. However, given limitations of the current evaluation, attention at the time of follow-up is suggested. Common duct measures 11 mm, likely postsurgical. No choledocholithiasis is seen. Moderate to severe hepatic steatosis. Moderate left and small right pleural effusions. Associated lower lobe opacities, likely atelectasis.  Electronically Signed   By: Julian Hy M.D.   On: 05/13/2020 11:46       Roshaun Pound  [place]   [facility] If 7PM-7AM, please contact night-coverage at [email or web address], Office  [phone]   05/14/2020, 2:49 PM  LOS: 2 days

## 2020-05-14 NOTE — Progress Notes (Signed)
Maylon Peppers, M.D. Gastroenterology & Hepatology   Interval History: Patient states feeling miserable as she had multiple episode of diarrhea yesterday, close to 4 watery bowel movements without any blood.  Denies having any nausea or vomiting but she has presented abdominal pain in her lower abdomen described as cramping.  Has not presented any fever chills. Patient was able to tolerate diet. Notably, C. difficile testing came back positive for antigen but negative for toxin, PCR is pending.  Inpatient Medications:  Current Facility-Administered Medications:  .  acetaminophen (TYLENOL) tablet 650 mg, 650 mg, Oral, Q6H PRN, Oswald Hillock, MD, 650 mg at 05/13/20 1125 .  ALPRAZolam Duanne Moron) tablet 0.25 mg, 0.25 mg, Oral, QHS PRN, Oswald Hillock, MD, 0.25 mg at 05/13/20 2136 .  amiodarone (PACERONE) tablet 200 mg, 200 mg, Oral, Daily, Darrick Meigs, Marge Duncans, MD, 200 mg at 05/13/20 1118 .  aspirin EC tablet 81 mg, 81 mg, Oral, Daily, Oswald Hillock, MD, 81 mg at 05/13/20 1119 .  clopidogrel (PLAVIX) tablet 75 mg, 75 mg, Oral, Daily, Oswald Hillock, MD, 75 mg at 05/13/20 1118 .  enoxaparin (LOVENOX) injection 40 mg, 40 mg, Subcutaneous, Q24H, Darrick Meigs, Marge Duncans, MD, 40 mg at 05/13/20 1611 .  insulin aspart (novoLOG) injection 0-9 Units, 0-9 Units, Subcutaneous, TID WC, Lama, Gagan S, MD .  isosorbide dinitrate (ISORDIL) tablet 5 mg, 5 mg, Oral, TID, Darrick Meigs, Marge Duncans, MD, 5 mg at 05/13/20 2136 .  lactated ringers infusion, , Intravenous, Continuous, Montez Morita, Quillian Quince, MD, Last Rate: 75 mL/hr at 05/13/20 1537, New Bag at 05/13/20 1537 .  loratadine (CLARITIN) tablet 10 mg, 10 mg, Oral, Daily, Darrick Meigs, Marge Duncans, MD, 10 mg at 05/13/20 1120 .  metoprolol tartrate (LOPRESSOR) tablet 50 mg, 50 mg, Oral, BID, Darrick Meigs, Marge Duncans, MD, 50 mg at 05/13/20 2136 .  ondansetron (ZOFRAN) tablet 4 mg, 4 mg, Oral, Q6H PRN **OR** ondansetron (ZOFRAN) injection 4 mg, 4 mg, Intravenous, Q6H PRN, Darrick Meigs, Marge Duncans, MD .  oxybutynin  (DITROPAN-XL) 24 hr tablet 10 mg, 10 mg, Oral, Daily, Darrick Meigs, Marge Duncans, MD, 10 mg at 05/13/20 1118 .  pantoprazole (PROTONIX) EC tablet 40 mg, 40 mg, Oral, Daily, Darrick Meigs, Marge Duncans, MD, 40 mg at 05/13/20 1119 .  rosuvastatin (CRESTOR) tablet 40 mg, 40 mg, Oral, Daily, Darrick Meigs, Marge Duncans, MD, 40 mg at 05/13/20 1119 .  traMADol (ULTRAM) tablet 50 mg, 50 mg, Oral, Q8H PRN, Oswald Hillock, MD, 50 mg at 05/13/20 0458 .  Zinc Oxide 40 % PSTE, , Topical, PRN, Oswald Hillock, MD   I/O    Intake/Output Summary (Last 24 hours) at 05/14/2020 0824 Last data filed at 05/13/2020 2143 Gross per 24 hour  Intake 416.84 ml  Output 300 ml  Net 116.84 ml     Physical Exam: Temp:  [97.8 F (36.6 C)-98.6 F (37 C)] 98 F (36.7 C) (11/27 0457) Pulse Rate:  [66-76] 75 (11/27 0457) Resp:  [18] 18 (11/27 0457) BP: (115-167)/(45-87) 133/50 (11/27 0457) SpO2:  [95 %-100 %] 100 % (11/27 0457)  Temp (24hrs), Avg:98.1 F (36.7 C), Min:97.8 F (36.6 C), Max:98.6 F (37 C) GENERAL: The patient is AO x3 but is forgetful and repeats information constantly , in no acute distress. HEENT: Head is normocephalic and atraumatic. EOMI are intact. Mouth is well hydrated and without lesions. NECK: Supple. No masses LUNGS: Clear to auscultation. No presence of rhonchi/wheezing/rales. Adequate chest expansion HEART: RRR, normal s1 and s2. ABDOMEN: mildly tender upon palpation of the  lower abdomen, no guarding, no peritoneal signs, and nondistended. BS +. No masses. EXTREMITIES: Without any cyanosis, clubbing, rash, lesions or edema. NEUROLOGIC: AOx3, no focal motor deficit. SKIN: no jaundice, no rash  Laboratory Data: CBC:     Component Value Date/Time   WBC 14.0 (H) 05/13/2020 0456   RBC 3.85 (L) 05/13/2020 0456   HGB 10.5 (L) 05/13/2020 0456   HGB 12.3 03/31/2020 1208   HCT 34.7 (L) 05/13/2020 0456   HCT 38.2 03/31/2020 1208   PLT 543 (H) 05/13/2020 0456   PLT 190 03/31/2020 1208   MCV 90.1 05/13/2020 0456   MCV 80  03/31/2020 1208   MCH 27.3 05/13/2020 0456   MCHC 30.3 05/13/2020 0456   RDW 17.1 (H) 05/13/2020 0456   RDW 13.1 03/31/2020 1208   LYMPHSABS 3.9 03/25/2017 1146   MONOABS 1.0 03/25/2017 1146   EOSABS 0.1 03/25/2017 1146   BASOSABS 0.0 03/25/2017 1146   COAG:  Lab Results  Component Value Date   INR 1.4 (H) 04/27/2020   INR 1.2 04/26/2020   INR 1.1 04/25/2020    BMP:  BMP Latest Ref Rng & Units 05/13/2020 05/12/2020 05/09/2020  Glucose 70 - 99 mg/dL 85 218(H) 186(H)  BUN 8 - 23 mg/dL _0 Creatinine 0.44 - 1.00 mg/dL 0.49 0.65 0.68  BUN/Creat Ratio 12 - 28 - - -  Sodium 135 - 145 mmol/L 138 134(L) 137  Potassium 3.5 - 5.1 mmol/L 3.5 3.7 3.9  Chloride 98 - 111 mmol/L 107 99 103  CO2 22 - 32 mmol/L 21(L) 21(L) 23  Calcium 8.9 - 10.3 mg/dL 8.5(L) 9.2 8.9    HEPATIC:  Hepatic Function Latest Ref Rng & Units 05/13/2020 05/12/2020 05/09/2020  Total Protein 6.5 - 8.1 g/dL 5.6(L) 7.0 5.5(L)  Albumin 3.5 - 5.0 g/dL 2.6(L) 3.3(L) 2.4(L)  AST 15 - 41 U/L _1 ALT 0 - 44 U/L _2 Alk Phosphatase 38 - 126 U/L 84 100 75  Total Bilirubin 0.3 - 1.2 mg/dL 0.4 0.4 0.5  Bilirubin, Direct 0.0 - 0.2 mg/dL - - -    CARDIAC:  Lab Results  Component Value Date   TROPONINI <0.03 05/19/2016      Imaging: I personally reviewed and interpreted the available labs, imaging and endoscopic files.   Assessment/Plan: 74 y.o. female with past medical history of depression, diabetes, A. fib, GERD, hypertension, previous history of C. difficile and recent diagnosis of severe coronary artery disease status post recent CABG x5 vessels on 04/27/2020, admitted to hospital for new onset diarrhea.  The patient had previous episodes of diarrhea throughout her life that were self-limited but she has presented persistent watery diarrhea after her surgical procedure.  Given the timeline of her symptoms, it is important to rule out an infectious etiology as the cause of her diarrhea and exposure to  antibiotics.  The patient had positive antigen testing for C. difficile but negative toxin, will wait for PCR to come back but for now we will recommend starting her on vancomycin 500 mg every 6 hours as she met criteria for severe disease (leukocytosis of 28,000).  We will also wait for GI pathogen panel to come back.  In the other hand, the patient was found to have imaging alterations suggestive of pancreatitis as there is a possible pseudocyst with fat stranding in the tail of the pancreas.  He did not have any elevation of her lipase but presented abdominal pain.  Her abdominal pain had  atypical characteristics and the patient is a poor historian which limits the information obtained.    Other causes such as hypertriglyceridemia have been ruled out with recent testing. Episode not related to alcohol or stones. Will continue lactated Ringer for IV fluid resuscitation and advance diet as tolerated.  I do not consider the cause of her diarrhea is directly related to her possible pancreatitis as she does not have any signs of atrophy and her symptoms were acute in nature.    # Acute diarrhea, possible C. Diff infection # Possible acute pancreatitis # Pancreatic pseudocyst - Follow up C. Diff PCR and GI pathogen panel  - Vancomycin 500 mg q6h PO - Avoid antidiarrheals for now - LR @ 75 cc/h - advance diet as tolerated - Pain control with morphine, avoid NSAIDs  Maylon Peppers, MD Gastroenterology and Hepatology [facility]  Note: Occasional unusual wording and randomly placed punctuation marks may result from the use of speech recognition technology to transcribe this document

## 2020-05-15 DIAGNOSIS — K859 Acute pancreatitis without necrosis or infection, unspecified: Secondary | ICD-10-CM | POA: Diagnosis not present

## 2020-05-15 DIAGNOSIS — K863 Pseudocyst of pancreas: Secondary | ICD-10-CM | POA: Diagnosis not present

## 2020-05-15 DIAGNOSIS — K8500 Idiopathic acute pancreatitis without necrosis or infection: Secondary | ICD-10-CM | POA: Diagnosis not present

## 2020-05-15 DIAGNOSIS — R197 Diarrhea, unspecified: Secondary | ICD-10-CM | POA: Diagnosis not present

## 2020-05-15 DIAGNOSIS — R109 Unspecified abdominal pain: Secondary | ICD-10-CM | POA: Diagnosis not present

## 2020-05-15 DIAGNOSIS — R110 Nausea: Secondary | ICD-10-CM | POA: Diagnosis not present

## 2020-05-15 LAB — GLUCOSE, CAPILLARY
Glucose-Capillary: 105 mg/dL — ABNORMAL HIGH (ref 70–99)
Glucose-Capillary: 124 mg/dL — ABNORMAL HIGH (ref 70–99)
Glucose-Capillary: 153 mg/dL — ABNORMAL HIGH (ref 70–99)

## 2020-05-15 MED ORDER — POTASSIUM CHLORIDE 10 MEQ/100ML IV SOLN
10.0000 meq | INTRAVENOUS | Status: AC
  Administered 2020-05-15 (×3): 10 meq via INTRAVENOUS
  Filled 2020-05-15: qty 100

## 2020-05-15 NOTE — Progress Notes (Deleted)
Cardiology Office Note  Date: 05/15/2020   ID: Katrina Castro, DOB [birth year], MRN [id number]  PCP:  Glenda Chroman, MD  Cardiologist:  Dorris Carnes, MD Electrophysiologist:  None   Chief Complaint: status post CABG   History of Present Illness: Katrina Castro is a 74 y.o. female with a history of CAD status post CABG x 5. DM2, Atrial fibrillation, USAP.  Last encounter with Dr. Harrington Challenger on 03/31/2020 with complaints of shortness of breath.  She is still been having spells with left-sided chest pain with exertion that was sharp radiating to left shoulder lasting several minutes accompanied by diaphoresis.  She was started on isosorbide 10 mg p.o. twice daily.  She continued to have these spells and had cut back on activity due to exacerbating symptoms.  Stated the isosorbide did not help much.  CT angiogram with FFR was ordered.  This was abnormal.  She was contacted and instructed to present to [facility] emergency department.  She underwent a cardiac catheterization demonstrating ostial left main lesion 25%, proximal LAD to mid LAD 75%, first diagonal lesion 90%, ostial circumflex to proximal circumflex 80%, first marginal lesion 95%, RPDA lesion 90%, first RPL lesion 90%.  She was referred to CTS for possible bypass.  On 04/27/2020 underwent 5 vessel bypass including: LIMA to distal LAD, SVG to RPDA  and PLA as a sequenced graft, left radial artery graft to ramus intermedius, and first obtuse marginal branch of LCx as a sequenced graft. She was discharged 05/09/2020. Hospital stay was prolonged due to diarrhea and UTI.  She presented to [facility] emergency department on 05/12/2020. Had complained of extreme weakness, marked decrease in appetite and feeling bad. Complained of some abdominal discomfort and was given Miralax by her daughter Her temperature was increased at 100.8. She was admitted for diarrhea, pancreatitis, and possible pseudocyst of the tail of the pancreas.  Past Medical  History:  Diagnosis Date  . Arthritis   . Concussion    2015  . Depression   . Diabetes ([place])    type 2  . Dysphagia   . Dysrhythmia    afib  . GERD (gastroesophageal reflux disease)   . Headache   . History of bronchitis   . Hypertension   . Seasonal allergies   . Toenail fungus     Past Surgical History:  Procedure Laterality Date  . APPENDECTOMY    . BACK SURGERY     x2  . BOTOX INJECTION N/A 06/06/2016   Procedure: BOTOX INJECTION;  Surgeon: Rogene Houston, MD;  [facility];  Service: Endoscopy;  Laterality: N/A;  . CHOLECYSTECTOMY    . COLONOSCOPY N/A 11/05/2012   Procedure: COLONOSCOPY;  Surgeon: Rogene Houston, MD;  [facility];  Service: Endoscopy;  Laterality: N/A;  1030  . COLONOSCOPY WITH PROPOFOL N/A 05/01/2019   Procedure: COLONOSCOPY WITH PROPOFOL;  Surgeon: Rogene Houston, MD;  [facility];  Service: Endoscopy;  Laterality: N/A;  11:20am  . CORONARY ARTERY BYPASS GRAFT N/A 04/27/2020   Procedure: CORONARY ARTERY BYPASS GRAFTING (CABG) TIMES FIVE USING LEFT INTERNAL MAMMARY ARTERY, LEFT HARVESTED RADIAL ARTERY, RIGHT GREATER SAPHENOUS VEIN HARVESTED ENDOSCOPICALLY.;  Surgeon: [name], MD;  Location: [place];  Service: Open Heart Surgery;  Laterality: N/A;  . ESOPHAGEAL DILATION N/A 07/06/2015   Procedure: ESOPHAGEAL DILATION;  Surgeon: Rogene Houston, MD;  [facility];  Service: Endoscopy;  Laterality: N/A;  . ESOPHAGEAL DILATION N/A  06/06/2016   Procedure: ESOPHAGEAL DILATION;  Surgeon: Rogene Houston, MD;  [facility];  Service: Endoscopy;  Laterality: N/A;  . ESOPHAGOGASTRODUODENOSCOPY N/A 02/12/2014   Procedure: ESOPHAGOGASTRODUODENOSCOPY (EGD);  Surgeon: Rogene Houston, MD;  [facility];  Service: Endoscopy;  Laterality: N/A;  150  . ESOPHAGOGASTRODUODENOSCOPY N/A 07/06/2015   Procedure: ESOPHAGOGASTRODUODENOSCOPY (EGD);  Surgeon: Rogene Houston, MD;  [facility];   Service: Endoscopy;  Laterality: N/A;  1:25 - moved to 1/18 @ 10:30 - Ann to notify pt  . ESOPHAGOGASTRODUODENOSCOPY (EGD) WITH ESOPHAGEAL DILATION N/A 07/25/2012   Procedure: ESOPHAGOGASTRODUODENOSCOPY (EGD) WITH ESOPHAGEAL DILATION;  Surgeon: Rogene Houston, MD;  [facility];  Service: Endoscopy;  Laterality: N/A;  325-rescheduled to [place] notified pt  . ESOPHAGOGASTRODUODENOSCOPY (EGD) WITH PROPOFOL N/A 06/06/2016   Procedure: ESOPHAGOGASTRODUODENOSCOPY (EGD) WITH PROPOFOL;  Surgeon: Rogene Houston, MD;  [facility];  Service: Endoscopy;  Laterality: N/A;  . EYE SURGERY     cataracts removed  . Foot surgeries Bilateral    hammer toes  . LEFT HEART CATH AND CORONARY ANGIOGRAPHY N/A 04/26/2020   Procedure: LEFT HEART CATH AND CORONARY ANGIOGRAPHY;  Surgeon: Martinique, Peter M, MD;  Location: [place] [facility];  Service: Cardiovascular;  Laterality: N/A;  . Venia Minks DILATION N/A 02/12/2014   Procedure: Venia Minks DILATION;  Surgeon: Rogene Houston, MD;  [facility];  Service: Endoscopy;  Laterality: N/A;  . POLYPECTOMY  05/01/2019   Procedure: POLYPECTOMY;  Surgeon: Rogene Houston, MD;  [facility];  Service: Endoscopy;;  colon   . RADIAL ARTERY HARVEST Left 04/27/2020   Procedure: LEFT RADIAL ARTERY HARVEST;  Surgeon: [name], MD;  Location: [place];  Service: Open Heart Surgery;  Laterality: Left;  . Right knee arthroscopy     x2  . TEE WITHOUT CARDIOVERSION N/A 04/27/2020   Procedure: TRANSESOPHAGEAL ECHOCARDIOGRAM (TEE);  Surgeon: [name], MD;  Location: [place];  Service: Open Heart Surgery;  Laterality: N/A;  . TONSILLECTOMY    . TOTAL ABDOMINAL HYSTERECTOMY    . TOTAL KNEE ARTHROPLASTY Right 04/03/2017   Procedure: RIGHT TOTAL KNEE ARTHROPLASTY;  Surgeon: Latanya Maudlin, MD;  Location: [facility];  Service: Orthopedics;  Laterality: Right;    No current facility-administered medications for this visit.   No current  outpatient medications on file.   Facility-Administered Medications Ordered in Other Visits  Medication Dose Route Frequency Provider Last Rate Last Admin  . acetaminophen (TYLENOL) tablet 650 mg  650 mg Oral Q6H PRN Oswald Hillock, MD   650 mg at 05/14/20 1826  . ALPRAZolam Duanne Moron) tablet 0.25 mg  0.25 mg Oral QHS PRN Oswald Hillock, MD   0.25 mg at 05/14/20 2218  . alum & mag hydroxide-simeth (MAALOX/MYLANTA) 200-200-20 MG/5ML suspension 30 mL  30 mL Oral Q4H PRN Oswald Hillock, MD      . amiodarone (PACERONE) tablet 200 mg  200 mg Oral Daily Oswald Hillock, MD   200 mg at 05/15/20 0928  . aspirin EC tablet 81 mg  81 mg Oral Daily Oswald Hillock, MD   81 mg at 05/15/20 0998  . clopidogrel (PLAVIX) tablet 75 mg  75 mg Oral Daily Oswald Hillock, MD   75 mg at 05/15/20 0928  . enoxaparin (LOVENOX) injection 40 mg  40 mg Subcutaneous Q24H Oswald Hillock, MD   40 mg at 05/15/20 1419  . insulin aspart (novoLOG) injection  0-9 Units  0-9 Units Subcutaneous TID WC Oswald Hillock, MD   2 Units at 05/15/20 1806  . isosorbide dinitrate (ISORDIL) tablet 5 mg  5 mg Oral TID Oswald Hillock, MD   5 mg at 05/15/20 1805  . lactated ringers infusion   Intravenous Continuous Harvel Quale, MD 75 mL/hr at 05/14/20 2217 75 mL/hr at 05/14/20 2217  . loratadine (CLARITIN) tablet 10 mg  10 mg Oral Daily Oswald Hillock, MD   10 mg at 05/15/20 0928  . metoprolol tartrate (LOPRESSOR) tablet 50 mg  50 mg Oral BID Oswald Hillock, MD   50 mg at 05/15/20 9629  . ondansetron (ZOFRAN) tablet 4 mg  4 mg Oral Q6H PRN Oswald Hillock, MD       Or  . ondansetron [facility]) injection 4 mg  4 mg Intravenous Q6H PRN Oswald Hillock, MD   4 mg at 05/14/20 1305  . oxybutynin (DITROPAN-XL) 24 hr tablet 10 mg  10 mg Oral Daily Oswald Hillock, MD   10 mg at 05/15/20 0927  . pantoprazole (PROTONIX) EC tablet 40 mg  40 mg Oral Daily Oswald Hillock, MD   40 mg at 05/15/20 0928  . rosuvastatin (CRESTOR) tablet 40 mg  40 mg Oral Daily Oswald Hillock,  MD   40 mg at 05/15/20 0928  . traMADol (ULTRAM) tablet 50 mg  50 mg Oral Q8H PRN Oswald Hillock, MD   50 mg at 05/15/20 0721  . vancomycin (VANCOCIN) 50 mg/mL oral solution 500 mg  500 mg Oral QID Oswald Hillock, MD   500 mg at 05/15/20 1806  . Zinc Oxide 40 % PSTE   Topical PRN Oswald Hillock, MD   Given at 05/14/20 1305   Allergies:  Levofloxacin, Cardizem [diltiazem], Sulfa antibiotics, Diazepam, and Lipitor [atorvastatin]   Social History: The patient  reports that she has never smoked. She has never used smokeless tobacco. She reports that she does not drink alcohol and does not use drugs.   Family History: The patient's family history includes Diabetes in her mother and sister; Stroke in her father.   ROS:  Please see the history of present illness. Otherwise, complete review of systems is positive for {NONE DEFAULTED:18576::"none"}.  All other systems are reviewed and negative.   Physical Exam: VS:  There were no vitals taken for this visit., BMI There is no height or weight on file to calculate BMI.  Wt Readings from Last 3 Encounters:  05/12/20 137 lb 2 oz (62.2 kg)  05/09/20 142 lb 8 oz (64.6 kg)  03/31/20 148 lb 3.2 oz (67.2 kg)    General: Patient appears comfortable at rest. HEENT: Conjunctiva and lids normal, oropharynx clear with moist mucosa. Neck: Supple, no elevated JVP or carotid bruits, no thyromegaly. Lungs: Clear to auscultation, nonlabored breathing at rest. Cardiac: Regular rate and rhythm, no S3 or significant systolic murmur, no pericardial rub. Abdomen: Soft, nontender, no hepatomegaly, bowel sounds present, no guarding or rebound. Extremities: No pitting edema, distal pulses 2+. Skin: Warm and dry. Musculoskeletal: No kyphosis. Neuropsychiatric: Alert and oriented x3, affect grossly appropriate.  ECG:  {EKG/Telemetry Strips Reviewed:[id number]}  Recent Labwork: 04/25/2020: B Natriuretic Peptide 34.7; TSH 5.542 05/12/2020: Magnesium 2.2 05/14/2020: ALT 13;  AST 21; BUN 8; Creatinine, Ser 0.52; Hemoglobin 10.9; Platelets 531; Potassium 3.0; Sodium 137     Component Value Date/Time   CHOL 138 04/26/2020 0145   CHOL 217 (H) 03/31/2020 1208  TRIG 131 05/14/2020 0827   HDL 37 (L) 04/26/2020 0145   HDL 38 (L) 03/31/2020 1208   CHOLHDL 3.7 04/26/2020 0145   VLDL 19 04/26/2020 0145   LDLCALC 82 04/26/2020 0145   LDLCALC 144 (H) 03/31/2020 1208    Other Studies Reviewed Today:  04/27/2020 Coronary Artery Bypass Grafting x 5 Left Internal Mammary Artery to Distal Left Anterior Descending Coronary Artery; Saphenous Vein Graft to right posterior Descending Coronary Artery and posterior lateral artery as a sequenced graft; left radial artery graft to ramus intermedius and first obtuse Marginal Branch of Left Circumflex Coronary Artery as a sequenced graft; Endoscopic Vein Harvest from right thigh Open left radial artery harvesting    04/26/2020 LEFT HEART CATH AND CORONARY ANGIOGRAPHY  Conclusion    Ost LM lesion is 25% stenosed.  Prox LAD to Mid LAD lesion is 75% stenosed.  1st Diag lesion is 90% stenosed.  Ost Cx to Prox Cx lesion is 80% stenosed.  1st Mrg lesion is 95% stenosed.  RPDA lesion is 90% stenosed.  1st RPL lesion is 90% stenosed.  The left ventricular systolic function is normal.  LV end diastolic pressure is normal.  The left ventricular ejection fraction is 55-65% by visual estimate.   1. Severe 3 vessel obstructive CAD 2. Normal LV function 3. Normal LVEDP  Plan: would consider revascularization with CABG.  Diagnostic Dominance: Right   CT FFR 04/22/2020 Narrative & Impression  EXAM: CT FFR ANALYSIS  CLINICAL DATA:  74 year old female with abnormal CCTA.  FINDINGS: FFRct analysis was performed on the original cardiac CT angiogram dataset. Diagrammatic representation of the FFRct analysis is provided in a separate PDF document in PACS. This dictation was created using the PDF document and an  interactive 3D model of the results. 3D model is not available in the EMR/PACS. Normal FFR range is >0.80.  1. Left Main: 0.98.  2. LAD: Occluded at the proximal segment. 3. D1: Occluded at the ostium. 4. LCX: Very small nondominant artery. 5. RCA: Proximal: 0.98.  Distal: 0.97.  IMPRESSION: 1. CT FFR analysis showed occluded proximal LAD and ostial portion of D1. Consider cardiac catheterization for further evaluation.       Echocardiogram 03/17/2020 1. Left ventricular ejection fraction, by estimation, is 55 to 60%. The left ventricle has normal function. The left ventricle has no regional wall motion abnormalities. There is mild left ventricular hypertrophy. Left ventricular diastolic parameters are indeterminate. 2. Right ventricular systolic function is normal. The right ventricular size is normal. Tricuspid regurgitation signal is inadequate for assessing PA pressure. 3. The mitral valve is grossly normal. Mild mitral valve regurgitation. 4. The aortic valve is tricuspid. Aortic valve regurgitation is not visualized. 5. The inferior vena cava is normal in size with <50% respiratory variability, suggesting right atrial pressure of 8 mmHg  Echocardiogram 05/18/16: Normal left ventricular systolic function, LVEF 16-96%, grade 1 diastolic dysfunction. The left atrium was normal in size.   Carotid doppler 04/28/2020 Right Carotid: Velocities in the right ICA are consistent with a 1-39% stenosis. Left Carotid: Velocities in the left ICA are consistent with a 1-39% stenosis. Vertebrals: Bilateral vertebral arteries demonstrate antegrade flow. Right Upper Extremity: Unable to obtain allens test due to tr band. Left Upper Extremity: Doppler waveform obliterate with left radial compression. Doppler waveform obliterate with left ulnar compression    Nuclear stress test 07/10/2019:   There was no ST segment deviation noted during stress.  Defect 1: There is a medium  defect of moderate severity  present in the mid inferoseptal, apical septal and apical inferior location. This is likely due to soft tissue attenuation as regional wall motion appears grossly normal.  This is a low risk study.  Nuclear stress EF: 62%.     Assessment and Plan:  No diagnosis found.   Medication Adjustments/Labs and Tests Ordered: Current medicines are reviewed at length with the patient today.  Concerns regarding medicines are outlined above.   Disposition: Follow-up with ***  Signed, Levell July, NP 05/15/2020 7:19 PM    [place] [facility] [address], [place], [place] [address] Phone: [phone]; Fax: [phone]

## 2020-05-15 NOTE — Progress Notes (Signed)
Katrina Castro, M.D. Gastroenterology & Hepatology   Interval History: Patient reports feeling much better today. States that after starting antibiotic she has not move her bowels since yesterday.  Denies having any abdominal pain but states she feels occasionally some discomfort in her lower abdomen.  Has tolerated diet adequately, denies any nausea, vomiting, fever, chills, melena or hematochezia. Notably, her C. difficile testing came back positive for antigen but negative for the toxin, PCR came back positive.  She is currently on vancomycin 500 mg every 6 hours oral.  Inpatient Medications:  Current Facility-Administered Medications:  .  acetaminophen (TYLENOL) tablet 650 mg, 650 mg, Oral, Q6H PRN, Oswald Hillock, MD, 650 mg at 05/14/20 1826 .  ALPRAZolam (XANAX) tablet 0.25 mg, 0.25 mg, Oral, QHS PRN, Oswald Hillock, MD, 0.25 mg at 05/14/20 2218 .  alum & mag hydroxide-simeth (MAALOX/MYLANTA) 200-200-20 MG/5ML suspension 30 mL, 30 mL, Oral, Q4H PRN, Darrick Meigs, Marge Duncans, MD .  amiodarone (PACERONE) tablet 200 mg, 200 mg, Oral, Daily, Darrick Meigs, Marge Duncans, MD, 200 mg at 05/15/20 0928 .  aspirin EC tablet 81 mg, 81 mg, Oral, Daily, Oswald Hillock, MD, 81 mg at 05/15/20 0928 .  clopidogrel (PLAVIX) tablet 75 mg, 75 mg, Oral, Daily, Oswald Hillock, MD, 75 mg at 05/15/20 0928 .  enoxaparin (LOVENOX) injection 40 mg, 40 mg, Subcutaneous, Q24H, Darrick Meigs, Marge Duncans, MD, 40 mg at 05/14/20 1306 .  insulin aspart (novoLOG) injection 0-9 Units, 0-9 Units, Subcutaneous, TID WC, Oswald Hillock, MD, 1 Units at 05/15/20 [phone] .  isosorbide dinitrate (ISORDIL) tablet 5 mg, 5 mg, Oral, TID, Darrick Meigs, Marge Duncans, MD, 5 mg at 05/14/20 2219 .  lactated ringers infusion, , Intravenous, Continuous, Montez Morita, Quillian Quince, MD, Last Rate: 75 mL/hr at 05/14/20 2217, 75 mL/hr at 05/14/20 2217 .  loratadine (CLARITIN) tablet 10 mg, 10 mg, Oral, Daily, Darrick Meigs, Marge Duncans, MD, 10 mg at 05/15/20 0928 .  metoprolol tartrate (LOPRESSOR) tablet 50 mg, 50  mg, Oral, BID, Darrick Meigs, Marge Duncans, MD, 50 mg at 05/15/20 0928 .  ondansetron (ZOFRAN) tablet 4 mg, 4 mg, Oral, Q6H PRN **OR** ondansetron (ZOFRAN) injection 4 mg, 4 mg, Intravenous, Q6H PRN, Oswald Hillock, MD, 4 mg at 05/14/20 1305 .  oxybutynin (DITROPAN-XL) 24 hr tablet 10 mg, 10 mg, Oral, Daily, Darrick Meigs, Marge Duncans, MD, 10 mg at 05/15/20 0927 .  pantoprazole (PROTONIX) EC tablet 40 mg, 40 mg, Oral, Daily, Oswald Hillock, MD, 40 mg at 05/15/20 0928 .  potassium chloride 10 mEq in 100 mL IVPB, 10 mEq, Intravenous, Q1 Hr x 3, Lama, Gagan S, MD, Last Rate: 100 mL/hr at 05/15/20 1122, 10 mEq at 05/15/20 1122 .  rosuvastatin (CRESTOR) tablet 40 mg, 40 mg, Oral, Daily, Oswald Hillock, MD, 40 mg at 05/15/20 0928 .  traMADol (ULTRAM) tablet 50 mg, 50 mg, Oral, Q8H PRN, Oswald Hillock, MD, 50 mg at 05/15/20 0721 .  vancomycin (VANCOCIN) 50 mg/mL oral solution 500 mg, 500 mg, Oral, QID, Darrick Meigs, Marge Duncans, MD, 500 mg at 05/15/20 0928 .  Zinc Oxide 40 % PSTE, , Topical, PRN, Oswald Hillock, MD, Given at 05/14/20 1305   I/O    Intake/Output Summary (Last 24 hours) at 05/15/2020 1214 Last data filed at 05/15/2020 0900 Gross per 24 hour  Intake 480 ml  Output 1500 ml  Net -1020 ml     Physical Exam: Temp:  [97.7 F (36.5 C)-97.8 F (36.6 C)] 97.8 F (36.6 C) (11/28 0451) Pulse Rate:  [  70-72] 72 (11/28 0451) Resp:  [16-18] 18 (11/28 0451) BP: (121-133)/(50-54) 121/50 (11/28 0451) SpO2:  [95 %-98 %] 95 % (11/28 0451)  Temp (24hrs), Avg:97.8 F (36.6 C), Min:97.7 F (36.5 C), Max:97.8 F (36.6 C) GENERAL: The patient is AO x3, in no acute distress. HEENT: Head is normocephalic and atraumatic. EOMI are intact. Mouth is well hydrated and without lesions. NECK: Supple. No masses LUNGS: Clear to auscultation. No presence of rhonchi/wheezing/rales. Adequate chest expansion HEART: RRR, normal s1 and s2. ABDOMEN:soft, non tender, no guarding, no peritoneal signs, and nondistended. BS +. No masses. EXTREMITIES: Without  any cyanosis, clubbing, rash, lesions or edema. NEUROLOGIC: AOx3, no focal motor deficit. SKIN: no jaundice, no rash  Laboratory Data: CBC:     Component Value Date/Time   WBC 8.4 05/14/2020 1429   RBC 4.01 05/14/2020 1429   HGB 10.9 (L) 05/14/2020 1429   HGB 12.3 03/31/2020 1208   HCT 35.7 (L) 05/14/2020 1429   HCT 38.2 03/31/2020 1208   PLT 531 (H) 05/14/2020 1429   PLT 190 03/31/2020 1208   MCV 89.0 05/14/2020 1429   MCV 80 03/31/2020 1208   MCH 27.2 05/14/2020 1429   MCHC 30.5 05/14/2020 1429   RDW 16.8 (H) 05/14/2020 1429   RDW 13.1 03/31/2020 1208   LYMPHSABS 3.9 03/25/2017 1146   MONOABS 1.0 03/25/2017 1146   EOSABS 0.1 03/25/2017 1146   BASOSABS 0.0 03/25/2017 1146   COAG:  Lab Results  Component Value Date   INR 1.4 (H) 04/27/2020   INR 1.2 04/26/2020   INR 1.1 04/25/2020    BMP:  BMP Latest Ref Rng & Units 05/14/2020 05/13/2020 05/12/2020  Glucose 70 - 99 mg/dL 183(H) 85 218(H)  BUN 8 - 23 mg/dL 8 9 14   Creatinine 0.44 - 1.00 mg/dL 0.52 0.49 0.65  BUN/Creat Ratio 12 - 28 - - -  Sodium 135 - 145 mmol/L 137 138 134(L)  Potassium 3.5 - 5.1 mmol/L 3.0(L) 3.5 3.7  Chloride 98 - 111 mmol/L 105 107 99  CO2 22 - 32 mmol/L 25 21(L) 21(L)  Calcium 8.9 - 10.3 mg/dL 8.5(L) 8.5(L) 9.2    HEPATIC:  Hepatic Function Latest Ref Rng & Units 05/14/2020 05/13/2020 05/12/2020  Total Protein 6.5 - 8.1 g/dL 5.6(L) 5.6(L) 7.0  Albumin 3.5 - 5.0 g/dL 2.6(L) 2.6(L) 3.3(L)  AST 15 - 41 U/L 21 19 16   ALT 0 - 44 U/L 13 14 15   Alk Phosphatase 38 - 126 U/L 90 84 100  Total Bilirubin 0.3 - 1.2 mg/dL <0.1(L) 0.4 0.4  Bilirubin, Direct 0.0 - 0.2 mg/dL - - -    CARDIAC:  Lab Results  Component Value Date   TROPONINI <0.03 05/19/2016      Imaging: I personally reviewed and interpreted the available labs, imaging and endoscopic files.   Assessment/Plan: 74 y.o.femalewithpast medicalhistory of depression, diabetes, A. fib, GERD, hypertension, previous history of C.  difficile and recent diagnosis of severe coronary artery disease status post recent CABG x5 vessels on 04/27/2020, admitted to hospital for new onset diarrhea. The patient had previous episodes of diarrhea throughout her life that were self-limited but she has presented persistent watery diarrheaafter her surgical procedure. The patient had an unusual timeline of her symptoms, but testing during current hospitalization came back positive for C. difficile (both antigen and PCR were positive, although toxin was negative).  Due to this, the patient has been on vancomycin 500 mg every 6 hours given the fact that she had met criteria for  severe disease (had leukocytosis of more than 15,000).  Clinically, she has presented adequate response to the antibiotic, with resolution of her diarrhea and improvement of their abdominal pain.   On the other hand, the patient was found to have imaging alterations suggestive of pancreatitis as there is a possible pseudocyst with fat stranding in the tail of the pancreas. She did not have any elevation of her lipase but presented abdominal pain. Her abdominal pain had atypical characteristics and the patient is a poor historian which limits the information obtained. Other causes such as hypertriglyceridemia have been ruled out with recent testing.Episode not related to alcohol or stones based on imaging. Patient has received adequate intravenous resuscitation with fluids and will need to stop lactated Ringer as she is currently tolerating diet. I do not consider the cause of her diarrhea is directly related to her possible pancreatitis as she does not have any signs of atrophy and her symptoms were acute in nature. She will need an outpatient CT pancreas to rule out malignancy as a cause of her acute pancreatitis, but I consider this is less likely as she has not presented any intrapancreatic ductal dilation or pancreatic atrophy.  # C. Diff infection # Acute  pancreatitis # Pancreatic pseudocyst - Vancomycin 500 mg q6h PO for 10 days - Avoid antidiarrheals - Stop LR - advance diet as tolerated - Pain control with low dose opiates if needed - Will require CT pancreas as outpatient  Katrina Peppers, MD Gastroenterology and Hepatology [facility]  Note: Occasional unusual wording and randomly placed punctuation marks may result from the use of speech recognition technology to transcribe this document

## 2020-05-15 NOTE — Progress Notes (Signed)
[facility]  PROGRESS NOTE  Katrina Castro:[id number] DOB: [birth year] DOA: 05/12/2020 PCP: Glenda Chroman, MD   Brief HPI:    74 y.o. female, with history of paroxysmal atrial fibrillation, diabetes mellitus type 2, hypertension, esophageal dysmotility who recently underwent CABG x5 on 04/27/2020.  Hospital course was complicated by diarrhea, UTI, patient was treated with IV cefepime for Pseudomonas UTI.  For diarrhea she was started on Imodium and Lomotil which was stopped.  C. difficile antigen was ordered however the sample could not be collected patient did not have bowel movement.  Patient was discharged home on 05/10/2020. Since patient came home patient has had poor appetite, also had fever with temperature 100.8, she did have 2 loose bowel movement yesterday which she describes as orange color stool. She also had nausea and vomiting at home.  She complains of abdominal pain.  She also complains of chest pain.  Patient came to ED for further evaluation.   Subjective   Patient seen and examined, started on p.o. vancomycin.  C. difficile PCR is positive with little toxin production.   Assessment/Plan:     1. Abdominal pain-MRI abdomen shows pancreatic inflammation, could not rule out mass.  Recommend repeat MRI in 6 to 12 weeks.  Lipase is normal.  GI following. 2. ?  C. difficile colitis-patient has positive antigen however negative C. difficile toxin.  Patient started on vancomycin 500 mg p.o. every 6 hours as per GI recommendation. 3. Chest pain-likely musculoskeletal, troponin x2 is unremarkable. 4. Hypokalemia-potassium is 3.0, will replace potassium and follow BMP in am. 5. CAD s/p CABG-continue home medication including aspirin, Plavix, Lipitor, isosorbide, metoprolol. 6. Diabetes mellitus type 2-continue sliding scale insulin with NovoLog. 7. Paroxysmal atrial fibrillation-continue amiodarone.  Xarelto was discontinued time of discharge after patient had CABG 2  weeks ago. 8. Recent UTI-patient had recent Pseudomonas UTI, which was treated with IV cefepime for 5 days.  Repeat urine culture showed no growth. 9. Leukocytosis-resolved, WBC is 14,000.  It was 28,000 on day of admission.       COVID-19 Labs  No results for input(s): DDIMER, FERRITIN, LDH, CRP in the last 72 hours.  Lab Results  Component Value Date   SARSCOV2NAA NEGATIVE 05/12/2020   [place] NEGATIVE 04/25/2020   [place] NEGATIVE 04/29/2019     Scheduled medications:   . amiodarone  200 mg Oral Daily  . aspirin EC  81 mg Oral Daily  . clopidogrel  75 mg Oral Daily  . enoxaparin (LOVENOX) injection  40 mg Subcutaneous Q24H  . insulin aspart  0-9 Units Subcutaneous TID WC  . isosorbide dinitrate  5 mg Oral TID  . loratadine  10 mg Oral Daily  . metoprolol tartrate  50 mg Oral BID  . oxybutynin  10 mg Oral Daily  . pantoprazole  40 mg Oral Daily  . rosuvastatin  40 mg Oral Daily  . vancomycin  500 mg Oral QID         CBG: Recent Labs  Lab 05/13/20 1729 05/14/20 0849 05/14/20 1109 05/15/20 0109 05/15/20 0846  GLUCAP 115* 90 131* 105* 124*    SpO2: 95 % O2 Flow Rate (L/min): 3 L/min    CBC: Recent Labs  Lab 05/09/20 0139 05/12/20 0358 05/13/20 0456 05/14/20 1429  WBC 12.9* 28.3* 14.0* 8.4  HGB 10.8* 12.9 10.5* 10.9*  HCT 34.4* 41.2 34.7* 35.7*  MCV 86.0 87.5 90.1 89.0  PLT 575* 693* 543* 531*    Basic Metabolic Panel: Recent Labs  Lab 05/09/20 0139 05/12/20 0358 05/12/20 0926 05/13/20 0456 05/14/20 1429  NA 137 134*  --  138 137  K 3.9 3.7  --  3.5 3.0*  CL 103 99  --  107 105  CO2 23 21*  --  21* 25  GLUCOSE 186* 218*  --  85 183*  BUN 8 14  --  9 8  CREATININE 0.68 0.65  --  0.49 0.52  CALCIUM 8.9 9.2  --  8.5* 8.5*  MG  --   --  2.2  --   --      Liver Function Tests: Recent Labs  Lab 05/09/20 0139 05/12/20 0358 05/13/20 0456 05/14/20 1429  AST 21 16 19 21   ALT 17 15 14 13   ALKPHOS 75 100 84 90  BILITOT 0.5 0.4  0.4 <0.1*  PROT 5.5* 7.0 5.6* 5.6*  ALBUMIN 2.4* 3.3* 2.6* 2.6*     Antibiotics: Anti-infectives (From admission, onward)   Start     Dose/Rate Route Frequency Ordered Stop   05/14/20 1400  vancomycin (VANCOCIN) 50 mg/mL oral solution 500 mg        500 mg Oral 4 times daily 05/14/20 1121 05/24/20 1359       DVT prophylaxis: Lovenox  Code Status: Full code  Family Communication: No family at bedside   Consultants:  Gastroenterology  Procedures:      Objective   Vitals:   05/14/20 0457 05/14/20 1652 05/14/20 2047 05/15/20 0451  BP: (!) 133/50 (!) 133/54 (!) 129/52 (!) 121/50  Pulse: 75 70 72 72  Resp: 18 18 16 18   Temp: 98 F (36.7 C) 97.8 F (36.6 C) 97.7 F (36.5 C) 97.8 F (36.6 C)  TempSrc:   Oral   SpO2: 100% 98% 96% 95%  Weight:      Height:        Intake/Output Summary (Last 24 hours) at 05/15/2020 1151 Last data filed at 05/15/2020 0900 Gross per 24 hour  Intake 480 ml  Output 1500 ml  Net -1020 ml    11/26 1901 - 11/28 0700 In: 480 [P.O.:480] Out: 1800 [Urine:1800]  Filed Weights   05/12/20 0329 05/12/20 1011  Weight: 64.6 kg 62.2 kg    Physical Examination:    General-appears in no acute distress  Heart-S1-S2, regular, no murmur auscultated  Lungs-clear to auscultation bilaterally, no wheezing or crackles auscultated  Abdomen-soft, nontender, no organomegaly  Extremities-no edema in the lower extremities  Neuro-alert, oriented x3, no focal deficit noted   Status is: Inpatient  Dispo: The patient is from: Home              Anticipated d/c is to: Home              Anticipated d/c date is: 05/16/2020              Patient currently not medically stable for discharge  Barrier to discharge-evaluation for epigastric pain      Data Reviewed:   Recent Results (from the past 240 hour(s))  Urine culture     Status: None   Collection Time: 05/12/20  4:53 AM   Specimen: Urine, Clean Catch  Result Value Ref Range Status    Specimen Description   Final    URINE, CLEAN CATCH Performed at [facility], [address] [place], [place] [address]    Special Requests   Final    NONE Performed at [facility], [address] [place], [place] [address]    Culture  Final    NO GROWTH Performed at [facility], [place] [address], [place], [place] [address]    Report Status 05/14/2020 FINAL  Final  Culture, blood (routine x 2)     Status: None (Preliminary result)   Collection Time: 05/12/20  5:07 AM   Specimen: BLOOD RIGHT HAND  Result Value Ref Range Status   Specimen Description BLOOD RIGHT HAND  Final   Special Requests   Final    BOTTLES DRAWN AEROBIC ONLY Blood Culture results may not be optimal due to an inadequate volume of blood received in culture bottles   Culture   Final    NO GROWTH 3 DAYS Performed at [facility], [address], [place], [place] [address]    Report Status PENDING  Incomplete  Culture, blood (routine x 2)     Status: None (Preliminary result)   Collection Time: 05/12/20  5:23 AM   Specimen: BLOOD RIGHT HAND  Result Value Ref Range Status   Specimen Description BLOOD RIGHT HAND  Final   Special Requests   Final    BOTTLES DRAWN AEROBIC AND ANAEROBIC Blood Culture results may not be optimal due to an inadequate volume of blood received in culture bottles   Culture   Final    NO GROWTH 3 DAYS Performed at [facility], [address] [place], [place] [address]    Report Status PENDING  Incomplete  Resp Panel by RT-PCR (Flu A&B, Covid) Nasopharyngeal Swab     Status: None   Collection Time: 05/12/20  6:17 AM   Specimen: Nasopharyngeal Swab; Nasopharyngeal(NP) swabs in vial transport medium  Result Value Ref Range Status   SARS Coronavirus 2 by RT PCR NEGATIVE NEGATIVE Final    Comment: (NOTE) SARS-CoV-2 target nucleic acids are NOT DETECTED.  The SARS-CoV-2 RNA is generally detectable in upper respiratory specimens during the acute phase of infection. The  lowest concentration of SARS-CoV-2 viral copies this assay can detect is 138 copies/mL. A negative result does not preclude SARS-Cov-2 infection and should not be used as the sole basis for treatment or other patient management decisions. A negative result may occur with  improper specimen collection/handling, submission of specimen other than nasopharyngeal swab, presence of viral mutation(s) within the areas targeted by this assay, and inadequate number of viral copies(<138 copies/mL). A negative result must be combined with clinical observations, patient history, and epidemiological information. The expected result is Negative.  Fact Sheet for Patients:  EntrepreneurPulse.com.au  Fact Sheet for Healthcare Providers:  IncredibleEmployment.be  This test is no t yet approved or cleared by the [place] FDA and  has been authorized for detection and/or diagnosis of SARS-CoV-2 by FDA under an Emergency Use Authorization (EUA). This EUA will remain  in effect (meaning this test can be used) for the duration of the COVID-19 declaration under Section 564(b)(1) of the Act, 21 U.S.C.section 360bbb-3(b)(1), unless the authorization is terminated  or revoked sooner.       Influenza A by PCR NEGATIVE NEGATIVE Final   Influenza B by PCR NEGATIVE NEGATIVE Final    Comment: (NOTE) The Xpert Xpress SARS-CoV-2/FLU/RSV plus assay is intended as an aid in the diagnosis of influenza from Nasopharyngeal swab specimens and should not be used as a sole basis for treatment. Nasal washings and aspirates are unacceptable for Xpert Xpress SARS-CoV-2/FLU/RSV testing.  Fact Sheet for Patients: EntrepreneurPulse.com.au  Fact Sheet for Healthcare Providers: IncredibleEmployment.be  This test is not yet approved or cleared by the [place] FDA  and has been authorized for detection and/or diagnosis of SARS-CoV-2 by FDA under  an Emergency Use Authorization (EUA). This EUA will remain in effect (meaning this test can be used) for the duration of the COVID-19 declaration under Section 564(b)(1) of the Act, 21 U.S.C. section 360bbb-3(b)(1), unless the authorization is terminated or revoked.  Performed at [facility], [address], [place], [place] [address]   C Difficile Quick Screen w PCR reflex     Status: Abnormal   Collection Time: 05/13/20  4:19 PM   Specimen: STOOL  Result Value Ref Range Status   C Diff antigen POSITIVE (A) NEGATIVE Final   C Diff toxin NEGATIVE NEGATIVE Final   C Diff interpretation Results are indeterminate. See PCR results.  Final    Comment: Performed at [facility], [address] [place], [place] [address]  C. Diff by PCR, Reflexed     Status: Abnormal   Collection Time: 05/13/20  4:19 PM  Result Value Ref Range Status   Toxigenic C. Difficile by PCR POSITIVE (A) NEGATIVE Final    Comment: Positive for toxigenic C. difficile with little to no toxin production. Only treat if clinical presentation suggests symptomatic illness. Performed at [facility], [place] [address], [place], [place] [address]   Gastrointestinal Panel by PCR , Stool     Status: None   Collection Time: 05/13/20  4:20 PM   Specimen: STOOL  Result Value Ref Range Status   Campylobacter species NOT DETECTED NOT DETECTED Final   Plesimonas shigelloides NOT DETECTED NOT DETECTED Final   Salmonella species NOT DETECTED NOT DETECTED Final   Yersinia enterocolitica NOT DETECTED NOT DETECTED Final   Vibrio species NOT DETECTED NOT DETECTED Final   Vibrio cholerae NOT DETECTED NOT DETECTED Final   Enteroaggregative E coli (EAEC) NOT DETECTED NOT DETECTED Final   Enteropathogenic E coli (EPEC) NOT DETECTED NOT DETECTED Final   Enterotoxigenic E coli (ETEC) NOT DETECTED NOT DETECTED Final   Shiga like toxin producing E coli (STEC) NOT DETECTED NOT DETECTED Final   Shigella/Enteroinvasive E coli (EIEC) NOT  DETECTED NOT DETECTED Final   Cryptosporidium NOT DETECTED NOT DETECTED Final   Cyclospora cayetanensis NOT DETECTED NOT DETECTED Final   Entamoeba histolytica NOT DETECTED NOT DETECTED Final   Giardia lamblia NOT DETECTED NOT DETECTED Final   Adenovirus F40/41 NOT DETECTED NOT DETECTED Final   Astrovirus NOT DETECTED NOT DETECTED Final   Norovirus GI/GII NOT DETECTED NOT DETECTED Final   Rotavirus A NOT DETECTED NOT DETECTED Final   Sapovirus (I, II, IV, and V) NOT DETECTED NOT DETECTED Final    Comment: Performed at [facility], [address] [place], [place] [address]    Recent Labs  Lab 05/12/20 0358  LIPASE 12   BNP (last 3 results) Recent Labs    04/25/20 2341  BNP 34.7     Studies:  No results found.     Oswald Hillock   [facility] If 7PM-7AM, please contact night-coverage at [email or web address], Office  [phone]   05/15/2020, 11:51 AM  LOS: 3 days

## 2020-05-16 ENCOUNTER — Ambulatory Visit: Payer: Medicare Other | Admitting: Family Medicine

## 2020-05-16 ENCOUNTER — Ambulatory Visit: Payer: Self-pay | Admitting: Cardiothoracic Surgery

## 2020-05-16 DIAGNOSIS — R509 Fever, unspecified: Secondary | ICD-10-CM | POA: Diagnosis not present

## 2020-05-16 DIAGNOSIS — E860 Dehydration: Secondary | ICD-10-CM | POA: Diagnosis not present

## 2020-05-16 DIAGNOSIS — A0472 Enterocolitis due to Clostridium difficile, not specified as recurrent: Secondary | ICD-10-CM | POA: Diagnosis not present

## 2020-05-16 DIAGNOSIS — R1013 Epigastric pain: Secondary | ICD-10-CM | POA: Diagnosis not present

## 2020-05-16 DIAGNOSIS — R109 Unspecified abdominal pain: Secondary | ICD-10-CM | POA: Diagnosis not present

## 2020-05-16 LAB — GLUCOSE, CAPILLARY
Glucose-Capillary: 115 mg/dL — ABNORMAL HIGH (ref 70–99)
Glucose-Capillary: 117 mg/dL — ABNORMAL HIGH (ref 70–99)
Glucose-Capillary: 117 mg/dL — ABNORMAL HIGH (ref 70–99)
Glucose-Capillary: 133 mg/dL — ABNORMAL HIGH (ref 70–99)
Glucose-Capillary: 137 mg/dL — ABNORMAL HIGH (ref 70–99)
Glucose-Capillary: 140 mg/dL — ABNORMAL HIGH (ref 70–99)
Glucose-Capillary: 194 mg/dL — ABNORMAL HIGH (ref 70–99)

## 2020-05-16 LAB — URINALYSIS, ROUTINE W REFLEX MICROSCOPIC
Bilirubin Urine: NEGATIVE
Glucose, UA: NEGATIVE mg/dL
Hgb urine dipstick: NEGATIVE
Ketones, ur: NEGATIVE mg/dL
Leukocytes,Ua: NEGATIVE
Nitrite: NEGATIVE
Protein, ur: NEGATIVE mg/dL
Specific Gravity, Urine: 1.004 — ABNORMAL LOW (ref 1.005–1.030)
pH: 7 (ref 5.0–8.0)

## 2020-05-16 LAB — COMPREHENSIVE METABOLIC PANEL
ALT: 12 U/L (ref 0–44)
AST: 19 U/L (ref 15–41)
Albumin: 2.4 g/dL — ABNORMAL LOW (ref 3.5–5.0)
Alkaline Phosphatase: 95 U/L (ref 38–126)
Anion gap: 8 (ref 5–15)
BUN: <5 mg/dL — ABNORMAL LOW (ref 8–23)
CO2: 26 mmol/L (ref 22–32)
Calcium: 8.4 mg/dL — ABNORMAL LOW (ref 8.9–10.3)
Chloride: 104 mmol/L (ref 98–111)
Creatinine, Ser: 0.49 mg/dL (ref 0.44–1.00)
GFR, Estimated: >60 mL/min (ref >60)
Glucose, Bld: 183 mg/dL — ABNORMAL HIGH (ref 70–99)
Potassium: 3.5 mmol/L (ref 3.5–5.1)
Sodium: 138 mmol/L (ref 135–145)
Total Bilirubin: 0.2 mg/dL — ABNORMAL LOW (ref 0.3–1.2)
Total Protein: 5.3 g/dL — ABNORMAL LOW (ref 6.5–8.1)

## 2020-05-16 NOTE — Progress Notes (Signed)
    Subjective: Diarrhea resolved. Last BM 2 days ago. +flatus. Mild abdominal cramping but no pain. No distension. No N/V. Tolerating heart healthy diet.   Objective: Vital signs in last 24 hours: Temp:  [97.9 F (36.6 C)-98.2 F (36.8 C)] 97.9 F (36.6 C) (11/29 0456) Pulse Rate:  [73-74] 73 (11/29 0456) Resp:  [18] 18 (11/29 0456) BP: (115-127)/(49-57) 115/57 (11/29 0456) SpO2:  [94 %-97 %] 95 % (11/29 0456) Last BM Date: 05/12/20 General:   Alert and oriented, pleasant Head:  Normocephalic and atraumatic. Abdomen:  Bowel sounds present, soft, non-tender, non-distended. No HSM or hernias noted. No rebound or guarding. No masses appreciated . Extremities:  Without  edema. Neurologic:  Alert and  oriented x4 Psych:  Alert and cooperative. Normal mood and affect.  Intake/Output from previous day: 11/28 0701 - 11/29 0700 In: 1993.2 [P.O.:960; I.V.:750; IV Piggyback:283.2] Out: 1700 [Urine:1700] Intake/Output this shift: No intake/output data recorded.  Lab Results: Recent Labs    05/14/20 1429  WBC 8.4  HGB 10.9*  HCT 35.7*  PLT 531*   BMET Recent Labs    05/14/20 1429  NA 137  K 3.0*  CL 105  CO2 25  GLUCOSE 183*  BUN 8  CREATININE 0.52  CALCIUM 8.5*   LFT Recent Labs    05/14/20 1429  PROT 5.6*  ALBUMIN 2.6*  AST 21  ALT 13  ALKPHOS 38  BILITOT <0.1*    Assessment: 74 year old female with history of CABG X5 several weeks ago, developing diarrhea while inpatient in [place] but no Cdiff testing as diarrhea had resolved, presenting this admission with worsening diarrhea. Found to have Cdiff with positive antigen, negative toxin, positive PCR, now treated with high dose vanc 500 mg QID starting 11/27 in light of severe disease (leukocytosis of 28,000).    Cdiff: prior episode in Feb 2020. Clinically with significant improvement, resolution of leukocytosis, and resolution of diarrhea with no BM for 2 days per patient; she is without signs/symptoms of  megacolon. Abdomen soft, good bowel sounds, +flatus. Continue with treatment for total of 10 days.   Incidentally noted to have imaging suggestive of pancreatitis (lipase 12) but without typical abdominal pain, and cystic mass at body of pancreas. MRI favoring pseudocyst. Castro follow-up imaging as outpatient.   Plan: Complete 10 day course of vancomycin Monitor for abdominal distension, signs/symptoms of megacolon Avoid anti-diarrheal agents Outpatient dedicated imaging of pancreas (repeat MRI)  Heart healthy diet Repeat BMP: hypokalemia several days ago   Katrina Needs, PhD, ANP-BC [facility]    LOS: 4 days    05/16/2020, 8:08 AM

## 2020-05-16 NOTE — Progress Notes (Signed)
[facility]  PROGRESS NOTE  Katrina Castro MAU:[id number] DOB: 04-06-1946 DOA: 05/12/2020 PCP: Glenda Chroman, MD   Brief HPI:    74 y.o. female, with history of paroxysmal atrial fibrillation, diabetes mellitus type 2, hypertension, esophageal dysmotility who recently underwent CABG x5 on 04/27/2020.  Hospital course was complicated by diarrhea, UTI, patient was treated with IV cefepime for Pseudomonas UTI.  For diarrhea she was started on Imodium and Lomotil which was stopped.  C. difficile antigen was ordered however the sample could not be collected patient did not have bowel movement.  Patient was discharged home on 05/10/2020. Since patient came home patient has had poor appetite, also had fever with temperature 100.8, she did have 2 loose bowel movement yesterday which she describes as orange color stool. She also had nausea and vomiting at home.  She complains of abdominal pain.  She also complains of chest pain.  Patient came to ED for further evaluation.   Subjective   Patient seen and examined, denies abdominal pain.  No BM for past 2 days.  Patient is currently on p.o. vancomycin.   Assessment/Plan:     1. Abdominal pain-MRI abdomen shows pancreatic inflammation, could not rule out mass.  Recommend repeat MRI in 6 to 12 weeks.  Lipase is normal.  GI following. 2. ?  C. difficile colitis-patient has positive antigen however negative C. difficile toxin.  Patient started on vancomycin 500 mg p.o. every 6 hours as per GI recommendation.  Will complete 10 days of vancomycin. 3. Chest pain-likely musculoskeletal, troponin x2 is unremarkable. 4. Hypokalemia-potassium was 3.5 this morning.  Follow BMP in am. 5. CAD s/p CABG-continue home medication including aspirin, Plavix, Lipitor, isosorbide, metoprolol. 6. Diabetes mellitus type 2-continue sliding scale insulin with NovoLog. 7. Paroxysmal atrial fibrillation-continue amiodarone.  Xarelto was discontinued time of discharge  after patient had CABG 73 weeks ago. 8. Recent UTI-patient had recent Pseudomonas UTI, which was treated with IV cefepime for 5 days.  Repeat urine culture showed no growth. 9. Leukocytosis-resolved, WBC is 8000.  It was 28,000 on day of admission.       COVID-19 Labs  No results for input(s): DDIMER, FERRITIN, LDH, CRP in the last 72 hours.  Lab Results  Component Value Date   SARSCOV2NAA NEGATIVE 05/12/2020   [place] NEGATIVE 04/25/2020   [place] NEGATIVE 04/29/2019     Scheduled medications:   . amiodarone  200 mg Oral Daily  . aspirin EC  81 mg Oral Daily  . clopidogrel  75 mg Oral Daily  . enoxaparin (LOVENOX) injection  40 mg Subcutaneous Q24H  . insulin aspart  0-9 Units Subcutaneous TID WC  . isosorbide dinitrate  5 mg Oral TID  . loratadine  10 mg Oral Daily  . metoprolol tartrate  50 mg Oral BID  . oxybutynin  10 mg Oral Daily  . pantoprazole  40 mg Oral Daily  . rosuvastatin  40 mg Oral Daily  . vancomycin  500 mg Oral QID         CBG: Recent Labs  Lab 05/15/20 2358 05/16/20 0607 05/16/20 0756 05/16/20 1144 05/16/20 1733  GLUCAP 133* 117* 115* 194* 137*    SpO2: 97 % O2 Flow Rate (L/min): 3 L/min    CBC: Recent Labs  Lab 05/12/20 0358 05/13/20 0456 05/14/20 1429  WBC 28.3* 14.0* 8.4  HGB 12.9 10.5* 10.9*  HCT 41.2 34.7* 35.7*  MCV 87.5 90.1 89.0  PLT 693* 543* 531*    Basic Metabolic Panel: Recent  Labs  Lab 05/12/20 0358 05/12/20 0926 05/13/20 0456 05/14/20 1429 05/16/20 1443  NA 134*  --  138 137 138  K 3.7  --  3.5 3.0* 3.5  CL 99  --  107 105 104  CO2 21*  --  21* 25 26  GLUCOSE 218*  --  85 183* 183*  BUN 14  --  9 8 <5*  CREATININE 0.65  --  0.49 0.52 0.49  CALCIUM 9.2  --  8.5* 8.5* 8.4*  MG  --  2.2  --   --   --      Liver Function Tests: Recent Labs  Lab 05/12/20 0358 05/13/20 0456 05/14/20 1429 05/16/20 1443  AST 16 19 21 19   ALT 15 14 13 12   ALKPHOS 100 84 90 95  BILITOT 0.4 0.4 <0.1* 0.2*   PROT 7.0 5.6* 5.6* 5.3*  ALBUMIN 3.3* 2.6* 2.6* 2.4*     Antibiotics: Anti-infectives (From admission, onward)   Start     Dose/Rate Route Frequency Ordered Stop   05/14/20 1400  vancomycin (VANCOCIN) 50 mg/mL oral solution 500 mg        500 mg Oral 4 times daily 05/14/20 1121 05/24/20 1359       DVT prophylaxis: Lovenox  Code Status: Full code  Family Communication: No family at bedside   Consultants:  Gastroenterology  Procedures:      Objective   Vitals:   05/15/20 2100 05/15/20 2133 05/16/20 0456 05/16/20 1351  BP: (!) 121/49  (!) 115/57 (!) 149/59  Pulse: 74  73 72  Resp: 18  18 18   Temp:  98.2 F (36.8 C) 97.9 F (36.6 C) 98.3 F (36.8 C)  TempSrc:  Oral Oral Oral  SpO2: 94%  95% 97%  Weight:      Height:        Intake/Output Summary (Last 24 hours) at 05/16/2020 1805 Last data filed at 05/16/2020 1011 Gross per 24 hour  Intake 1230 ml  Output 1550 ml  Net -320 ml    11/27 1901 - 11/29 0700 In: 1993.2 [P.O.:960; I.V.:750] Out: 2300 [Urine:2300]  Filed Weights   05/12/20 0329 05/12/20 1011  Weight: 64.6 kg 62.2 kg    Physical Examination:    General-appears in no acute distress  Heart-S1-S2, regular, no murmur auscultated  Lungs-clear to auscultation bilaterally, no wheezing or crackles auscultated  Abdomen-soft, nontender, no organomegaly  Extremities-no edema in the lower extremities  Neuro-alert, oriented x3, no focal deficit noted   Status is: Inpatient  Dispo: The patient is from: Home              Anticipated d/c is to: Home              Anticipated d/c date is: 05/16/2020              Patient currently not medically stable for discharge  Barrier to discharge-evaluation for epigastric pain      Data Reviewed:   Recent Results (from the past 240 hour(s))  Urine culture     Status: None   Collection Time: 05/12/20  4:53 AM   Specimen: Urine, Clean Catch  Result Value Ref Range Status   Specimen Description    Final    URINE, CLEAN CATCH Performed at [facility], [address], [place], [place] [address]    Special Requests   Final    NONE Performed at [facility], [address] [place], [place] [address]    Culture  Final    NO GROWTH Performed at [facility], [place] [address], [place], [place] [address]    Report Status 05/14/2020 FINAL  Final  Culture, blood (routine x 2)     Status: None (Preliminary result)   Collection Time: 05/12/20  5:07 AM   Specimen: BLOOD RIGHT HAND  Result Value Ref Range Status   Specimen Description BLOOD RIGHT HAND  Final   Special Requests   Final    BOTTLES DRAWN AEROBIC ONLY Blood Culture results may not be optimal due to an inadequate volume of blood received in culture bottles   Culture   Final    NO GROWTH 4 DAYS Performed at [facility], [address], [place], [place] [address]    Report Status PENDING  Incomplete  Culture, blood (routine x 2)     Status: None (Preliminary result)   Collection Time: 05/12/20  5:23 AM   Specimen: BLOOD RIGHT HAND  Result Value Ref Range Status   Specimen Description BLOOD RIGHT HAND  Final   Special Requests   Final    BOTTLES DRAWN AEROBIC AND ANAEROBIC Blood Culture results may not be optimal due to an inadequate volume of blood received in culture bottles   Culture   Final    NO GROWTH 4 DAYS Performed at [facility], [address], [place], [place] [address]    Report Status PENDING  Incomplete  Resp Panel by RT-PCR (Flu A&B, Covid) Nasopharyngeal Swab     Status: None   Collection Time: 05/12/20  6:17 AM   Specimen: Nasopharyngeal Swab; Nasopharyngeal(NP) swabs in vial transport medium  Result Value Ref Range Status   SARS Coronavirus 2 by RT PCR NEGATIVE NEGATIVE Final    Comment: (NOTE) SARS-CoV-2 target nucleic acids are NOT DETECTED.  The SARS-CoV-2 RNA is generally detectable in upper respiratory specimens during the acute phase of infection. The lowest concentration of SARS-CoV-2  viral copies this assay can detect is 138 copies/mL. A negative result does not preclude SARS-Cov-2 infection and should not be used as the sole basis for treatment or other patient management decisions. A negative result may occur with  improper specimen collection/handling, submission of specimen other than nasopharyngeal swab, presence of viral mutation(s) within the areas targeted by this assay, and inadequate number of viral copies(<138 copies/mL). A negative result must be combined with clinical observations, patient history, and epidemiological information. The expected result is Negative.  Fact Sheet for Patients:  EntrepreneurPulse.com.au  Fact Sheet for Healthcare Providers:  IncredibleEmployment.be  This test is no t yet approved or cleared by the [place] FDA and  has been authorized for detection and/or diagnosis of SARS-CoV-2 by FDA under an Emergency Use Authorization (EUA). This EUA will remain  in effect (meaning this test can be used) for the duration of the COVID-19 declaration under Section 564(b)(1) of the Act, 21 U.S.C.section 360bbb-3(b)(1), unless the authorization is terminated  or revoked sooner.       Influenza A by PCR NEGATIVE NEGATIVE Final   Influenza B by PCR NEGATIVE NEGATIVE Final    Comment: (NOTE) The Xpert Xpress SARS-CoV-2/FLU/RSV plus assay is intended as an aid in the diagnosis of influenza from Nasopharyngeal swab specimens and should not be used as a sole basis for treatment. Nasal washings and aspirates are unacceptable for Xpert Xpress SARS-CoV-2/FLU/RSV testing.  Fact Sheet for Patients: EntrepreneurPulse.com.au  Fact Sheet for Healthcare Providers: IncredibleEmployment.be  This test is not yet approved or cleared by the [place] FDA  and has been authorized for detection and/or diagnosis of SARS-CoV-2 by FDA under an Emergency Use Authorization  (EUA). This EUA will remain in effect (meaning this test can be used) for the duration of the COVID-19 declaration under Section 564(b)(1) of the Act, 21 U.S.C. section 360bbb-3(b)(1), unless the authorization is terminated or revoked.  Performed at [facility], [address] [place], [place] [address]   C Difficile Quick Screen w PCR reflex     Status: Abnormal   Collection Time: 05/13/20  4:19 PM   Specimen: STOOL  Result Value Ref Range Status   C Diff antigen POSITIVE (A) NEGATIVE Final   C Diff toxin NEGATIVE NEGATIVE Final   C Diff interpretation Results are indeterminate. See PCR results.  Final    Comment: Performed at [facility], [address], [place], [place] [address]  C. Diff by PCR, Reflexed     Status: Abnormal   Collection Time: 05/13/20  4:19 PM  Result Value Ref Range Status   Toxigenic C. Difficile by PCR POSITIVE (A) NEGATIVE Final    Comment: Positive for toxigenic C. difficile with little to no toxin production. Only treat if clinical presentation suggests symptomatic illness. Performed at [facility], [place] [address] [place], [place] [address]   Gastrointestinal Panel by PCR , Stool     Status: None   Collection Time: 05/13/20  4:20 PM   Specimen: STOOL  Result Value Ref Range Status   Campylobacter species NOT DETECTED NOT DETECTED Final   Plesimonas shigelloides NOT DETECTED NOT DETECTED Final   Salmonella species NOT DETECTED NOT DETECTED Final   Yersinia enterocolitica NOT DETECTED NOT DETECTED Final   Vibrio species NOT DETECTED NOT DETECTED Final   Vibrio cholerae NOT DETECTED NOT DETECTED Final   Enteroaggregative E coli (EAEC) NOT DETECTED NOT DETECTED Final   Enteropathogenic E coli (EPEC) NOT DETECTED NOT DETECTED Final   Enterotoxigenic E coli (ETEC) NOT DETECTED NOT DETECTED Final   Shiga like toxin producing E coli (STEC) NOT DETECTED NOT DETECTED Final   Shigella/Enteroinvasive E coli (EIEC) NOT DETECTED NOT DETECTED Final    Cryptosporidium NOT DETECTED NOT DETECTED Final   Cyclospora cayetanensis NOT DETECTED NOT DETECTED Final   Entamoeba histolytica NOT DETECTED NOT DETECTED Final   Giardia lamblia NOT DETECTED NOT DETECTED Final   Adenovirus F40/41 NOT DETECTED NOT DETECTED Final   Astrovirus NOT DETECTED NOT DETECTED Final   Norovirus GI/GII NOT DETECTED NOT DETECTED Final   Rotavirus A NOT DETECTED NOT DETECTED Final   Sapovirus (I, II, IV, and V) NOT DETECTED NOT DETECTED Final    Comment: Performed at [facility], [address] [place],  [address]    Recent Labs  Lab 05/12/20 0358  LIPASE 12   BNP (last 3 results) Recent Labs    04/25/20 2341  BNP 34.7    [place]   [facility] If 7PM-7AM, please contact night-coverage at [email or web address], Office  [phone]   05/16/2020, 6:05 PM  LOS: 4 days

## 2020-05-17 ENCOUNTER — Telehealth: Payer: Self-pay | Admitting: Gastroenterology

## 2020-05-17 DIAGNOSIS — R1013 Epigastric pain: Secondary | ICD-10-CM | POA: Diagnosis not present

## 2020-05-17 DIAGNOSIS — R630 Anorexia: Secondary | ICD-10-CM

## 2020-05-17 DIAGNOSIS — A0472 Enterocolitis due to Clostridium difficile, not specified as recurrent: Secondary | ICD-10-CM | POA: Diagnosis not present

## 2020-05-17 LAB — BASIC METABOLIC PANEL
Anion gap: 9 (ref 5–15)
BUN: <5 mg/dL — ABNORMAL LOW (ref 8–23)
CO2: 28 mmol/L (ref 22–32)
Calcium: 8.6 mg/dL — ABNORMAL LOW (ref 8.9–10.3)
Chloride: 102 mmol/L (ref 98–111)
Creatinine, Ser: 0.5 mg/dL (ref 0.44–1.00)
GFR, Estimated: >60 mL/min (ref >60)
Glucose, Bld: 128 mg/dL — ABNORMAL HIGH (ref 70–99)
Potassium: 3.6 mmol/L (ref 3.5–5.1)
Sodium: 139 mmol/L (ref 135–145)

## 2020-05-17 LAB — CULTURE, BLOOD (ROUTINE X 2)
Culture: NO GROWTH
Culture: NO GROWTH

## 2020-05-17 LAB — GLUCOSE, CAPILLARY
Glucose-Capillary: 116 mg/dL — ABNORMAL HIGH (ref 70–99)
Glucose-Capillary: 135 mg/dL — ABNORMAL HIGH (ref 70–99)
Glucose-Capillary: 187 mg/dL — ABNORMAL HIGH (ref 70–99)

## 2020-05-17 MED ORDER — VANCOMYCIN HCL 250 MG PO CAPS: 500.0000 mg | ORAL_CAPSULE | Freq: Four times a day (QID) | ORAL | 0 refills | Status: AC

## 2020-05-17 NOTE — TOC Transition Note (Signed)
Transition of Care [facility]) - CM/SW Discharge Note   Patient Details  Name: Katrina Castro MRN: [id number] Date of Birth: 1945-08-17  Transition of Care [facility]) CM/SW Contact:  Salome Arnt, LCSW Phone Number: 05/17/2020, 11:36 AM   Clinical Narrative:  Pt d/c today. Linda with [facility] notified. Pt will have home health PT, OT, and RN services. She is aware that [facility] will call her to schedule visits. Information on AVS. Pt states her daughter will be picking her up today. No other needs reported.      Final next level of care: [place] Barriers to Discharge: Barriers Resolved   Patient Goals and CMS Choice        Discharge Placement                  Name of family member notified: pt only Patient and family notified of of transfer: 05/17/20  Discharge Plan and Services                          HH Arranged: RN, PT, OT [facility] Agency: [place] ([place]) Date [place]: 05/17/20 Time [place]: [place] Representative spoke with at [place]: [place] Determinants of Health ([place]) Interventions     Readmission Risk Interventions Readmission Risk Prevention Plan 05/03/2020  Transportation Screening Complete  PCP or Specialist Appt within 5-7 Days Complete  Home Care Screening Complete  Medication Review (RN CM) Complete  Some recent data might be hidden

## 2020-05-17 NOTE — Discharge Summary (Signed)
Physician Discharge Summary  Katrina Castro VQX:[id number] DOB: December 05, 1945 DOA: 05/12/2020  PCP: Glenda Chroman, MD  Admit date: 05/12/2020 Discharge date: 05/17/2020  Time spent: 50 minutes  Recommendations for Outpatient Follow-up:  1. Follow-up PCP in 2 weeks   Discharge Diagnoses:  Active Problems:   Fever   Discharge Condition: Stable  Diet recommendation: Heart healthy diet  Filed Weights   05/12/20 0329 05/12/20 1011  Weight: 64.6 kg 62.2 kg    History of present illness:  74 y.o.female,with history of paroxysmal atrial fibrillation, diabetes mellitus type 2, hypertension, esophageal dysmotility who recently underwent CABG x5 on 04/27/2020.Hospital course was complicated by diarrhea, UTI, patient was treated with IV cefepime for Pseudomonas UTI. For diarrhea she was started on Imodium and Lomotil which was stopped. C. difficile antigen was ordered however the sample could not be collected patient did not have bowel movement. Patient was discharged home on 05/10/2020. Since patient came home patient has had poor appetite, also had fever with temperature 100.8, she did have 2 loose bowel movement yesterday which she describes as orange color stool. She also had nausea and vomiting at home. She complains of abdominal pain. She also complains of chest pain.Patient came to ED for further evaluation.  Hospital Course:   1. Abdominal pain-MRI abdomen shows pancreatic inflammation, could not rule out mass.  Recommend repeat MRI in 6 to 12 weeks.  Lipase is normal.    GI saw the patient, no further intervention at this time.  Patient to follow-up after GI, Dr. Laural Golden in 4 to 6 weeks. 2. ?  C. difficile colitis-patient has positive antigen however negative C. difficile toxin.  Patient started on vancomycin 500 mg p.o. every 6 hours as per GI recommendation.  Will complete 10 days of vancomycin. 3. Chest pain-likely musculoskeletal, troponin x2 is  unremarkable. 4. Hypokalemia-replete 5. CAD s/p CABG-continue home medication including aspirin, Plavix, Lipitor, isosorbide, metoprolol. 6. Diabetes mellitus type 2-continue home regimen. 7. Paroxysmal atrial fibrillation-continue amiodarone.  Xarelto was discontinued time of discharge after patient had CABG 2 weeks ago. 8. Recent UTI-patient had recent Pseudomonas UTI, which was treated with IV cefepime for 5 days.  Repeat urine culture showed no growth. 9. Leukocytosis-resolved, WBC is 8000.  It was 28,000 on day of admission.    Procedures:    Consultations:    Discharge Exam: Vitals:   05/17/20 0512 05/17/20 0842  BP: 138/66 134/61  Pulse: 78 75  Resp: 20 18  Temp: 98.7 F (37.1 C)   SpO2: 96% 100%    General: Appears in no acute distress Cardiovascular: S1-S2, regular Respiratory: Clear to auscultation bilaterally  Discharge Instructions   Discharge Instructions    Diet - low sodium heart healthy   Complete by: As directed    Increase activity slowly   Complete by: As directed    No wound care   Complete by: As directed      Allergies as of 05/17/2020      Reactions   Levofloxacin Anaphylaxis, Rash   Cardizem [diltiazem] Other (See Comments)   Patient states that she could not think, felt that she was in the twilight zone. Arm discomfort.   Sulfa Antibiotics Itching   Diazepam Nausea Only   Lipitor [atorvastatin] Other (See Comments)   Caused her body to be "out of whack"  - elevated sugar also.      Medication List    STOP taking these medications   amoxicillin 500 MG capsule Commonly known as: AMOXIL  TAKE these medications   acetaminophen 325 MG tablet Commonly known as: TYLENOL Take 2 tablets (650 mg total) by mouth every 6 (six) hours as needed for mild pain.   ALPRAZolam 0.5 MG tablet Commonly known as: XANAX Take 0.25 mg by mouth at bedtime as needed for anxiety.   amiodarone 200 MG tablet Commonly known as: PACERONE Take 1  tablet (200 mg total) by mouth daily.   aspirin 81 MG EC tablet Take 1 tablet (81 mg total) by mouth daily. Swallow whole.   cetirizine 10 MG tablet Commonly known as: ZYRTEC Take 10 mg by mouth daily.   clopidogrel 75 MG tablet Commonly known as: PLAVIX Take 1 tablet (75 mg total) by mouth daily.   glimepiride 2 MG tablet Commonly known as: AMARYL Take 2 mg by mouth daily with breakfast.   isosorbide dinitrate 5 MG tablet Commonly known as: ISORDIL Take 1 tablet (5 mg total) by mouth 3 (three) times daily.   metoprolol tartrate 50 MG tablet Commonly known as: LOPRESSOR Take 50 mg by mouth 2 (two) times daily.   oxybutynin 10 MG 24 hr tablet Commonly known as: DITROPAN-XL Take 10 mg by mouth daily.   pantoprazole 40 MG tablet Commonly known as: PROTONIX Take 40 mg by mouth daily.   rosuvastatin 40 MG tablet Commonly known as: CRESTOR Take 1 tablet (40 mg total) by mouth daily.   traMADol 50 MG tablet Commonly known as: ULTRAM Take 1 tablet (50 mg total) by mouth every 8 (eight) hours as needed for moderate pain.   vancomycin 250 MG capsule Commonly known as: VANCOCIN Take 2 capsules (500 mg total) by mouth 4 (four) times daily for 7 days. Through 05/24/2020   Zinc Oxide 12.8 % ointment Commonly known as: TRIPLE PASTE Apply topically as needed for irritation.      Allergies  Allergen Reactions  . Levofloxacin Anaphylaxis and Rash  . Cardizem [Diltiazem] Other (See Comments)    Patient states that she could not think, felt that she was in the twilight zone. Arm discomfort.  . Sulfa Antibiotics Itching  . Diazepam Nausea Only  . Lipitor [Atorvastatin] Other (See Comments)    Caused her body to be "out of whack"  - elevated sugar also.    Follow-up Information    [facility].   Specialty: Home Health Services Why: Will contact you to schedule home health visits.                The results of significant diagnostics  from this hospitalization (including imaging, microbiology, ancillary and laboratory) are listed below for reference.    Significant Diagnostic Studies: DG Chest 1 View  Result Date: 04/29/2020 CLINICAL DATA:  Status post extubation EXAM: CHEST  1 VIEW COMPARISON:  April 28, 2020 FINDINGS: Nasogastric tube and endotracheal tube have been removed. Swan-Ganz catheter is been removed. Cordis tip is in the superior vena cava. There is a mediastinal drain as well as chest tubes on each side, stable in position. No pneumothorax. There is bibasilar atelectasis. No consolidation. Heart size and pulmonary vascularity are normal. No adenopathy. No bone lesions. IMPRESSION: Tube and catheter positions as described without pneumothorax. Bibasilar atelectasis. No new opacity. Stable cardiac silhouette. Electronically Signed   By: Lowella Grip III M.D.   On: 04/29/2020 07:59   DG Chest 2 View  Result Date: 05/01/2020 CLINICAL DATA:  Status post recent coronary artery bypass grafting. Atelectatic change. EXAM: CHEST - 2 VIEW COMPARISON:  April 30, 2020 FINDINGS: Cordis tip as well as bilateral chest tubes and mediastinal drain have been removed. Temporary pacemaker wires are attached to the right heart. There is no demonstrable pneumothorax. There is bibasilar atelectasis with small left pleural effusion. No consolidation or edema evident. Heart size and pulmonary vascular normal. Patient is status post coronary artery bypass grafting. No adenopathy. No bone lesions. IMPRESSION: No evident pneumothorax. Bibasilar atelectasis with small left pleural effusion. No consolidation. Stable cardiac silhouette. Electronically Signed   By: Lowella Grip III M.D.   On: 05/01/2020 09:49   DG Chest 2 View  Result Date: 04/25/2020 CLINICAL DATA:  Chest pain for 1 day, reflux, hypertension EXAM: CHEST - 2 VIEW COMPARISON:  03/24/2020 FINDINGS: Frontal and lateral views of the chest demonstrate an unremarkable  cardiac silhouette. Small hiatal hernia. No airspace disease, effusion, or pneumothorax. Mild bibasilar scarring unchanged. No acute bony abnormalities. IMPRESSION: 1. No acute intrathoracic process. 2. Hiatal hernia. Electronically Signed   By: Randa Ngo M.D.   On: 04/25/2020 21:12   MR ABDOMEN W WO CONTRAST  Result Date: 05/13/2020 CLINICAL DATA:  Pancreatic lesion on CT EXAM: MRI ABDOMEN WITHOUT AND WITH CONTRAST TECHNIQUE: Multiplanar multisequence MR imaging of the abdomen was performed both before and after the administration of intravenous contrast. CONTRAST:  9mL GADAVIST GADOBUTROL 1 MMOL/ML IV SOLN COMPARISON:  CT abdomen/pelvis dated 05/12/2020 FINDINGS: Motion degraded images. Lower chest: Moderate left and small right pleural effusions. Associated lower lobe opacities, likely atelectasis. Hepatobiliary: Moderate to severe hepatic steatosis. Postcontrast evaluation of the liver is severely motion degraded, but there are no suspicious/enhancing hepatic lesions. Status post cholecystectomy. No intrahepatic ductal dilatation. Dilated common duct, measuring 11 mm (series 21/image 5), and mildly tapering at the ampulla, likely postsurgical. No choledocholithiasis is seen. Pancreas: Bilobed cystic lesion along the pancreatic body, measuring approximately 1.9 x 2.8 x 5.0 cm (series 7/image 22; series 21/image 9). No convincing enhancement following contrast administration, noting motion degradation. This appearance favors a benign pseudocyst. No pancreatic atrophy or ductal dilatation. Mild peripancreatic edema/inflammatory changes along the pancreatic tail (series 7/image 20), suggesting mild acute pancreatitis, although poorly evaluated due to motion degradation. Spleen:  Within normal limits. Adrenals/Urinary Tract:  Adrenal glands are within normal limits. Kidneys are within normal limits.  No hydronephrosis. Stomach/Bowel: Stomach and visualized bowel are grossly unremarkable. Vascular/Lymphatic:   No evidence of abdominal aortic aneurysm. Other: Mild upper abdominal ascites along the pancreatic tail, as noted above. Musculoskeletal: No focal osseous lesions. IMPRESSION: Mild peripancreatic edema/inflammatory changes along the pancreatic tail, suggesting mild acute pancreatitis, although poorly evaluated due to motion degradation. Given the focal appearance and the normal laboratory evaluation, follow-up MR abdomen with/without contrast is suggested in 6-12 weeks to exclude an underlying lesion. 5.0 cm bilobed cystic lesion along the pancreatic body, favoring a benign pseudocyst. However, given limitations of the current evaluation, attention at the time of follow-up is suggested. Common duct measures 11 mm, likely postsurgical. No choledocholithiasis is seen. Moderate to severe hepatic steatosis. Moderate left and small right pleural effusions. Associated lower lobe opacities, likely atelectasis. Electronically Signed   By: Julian Hy M.D.   On: 05/13/2020 11:46   CT ABDOMEN PELVIS W CONTRAST  Result Date: 05/12/2020 CLINICAL DATA:  Nausea, vomiting, and fever for 2 days, recently discharged from hospital post CABG; past history diabetes mellitus, hypertension, appendectomy, hysterectomy, cholecystectomy, GERD EXAM: CT ABDOMEN AND PELVIS WITH CONTRAST TECHNIQUE: Multidetector CT imaging of the abdomen and pelvis was performed using the standard protocol following bolus  administration of intravenous contrast. Sagittal and coronal MPR images reconstructed from axial data set. CONTRAST:  128mL OMNIPAQUE IOHEXOL 300 MG/ML SOLN IV. Dilute oral contrast. COMPARISON:  07/27/2010 FINDINGS: Lower chest: Small BILATERAL pleural effusions and bibasilar atelectasis greater on LEFT. Postsurgical changes of CABG. Minimal pericardial fluid. Hepatobiliary: Gallbladder surgically absent. Diffuse hepatic steatosis. No focal hepatic mass lesion. Pancreas: Bilobed cystic mass identified at body of pancreas, smaller  component more superiorly extending towards lesser sac 2.2 x 1.5 x 1.7 cm and slightly more caudally 3.1 x 2.4 x 3.1 cm. This appears new since 2012. Remainder of pancreas normal without additional mass, calcification or ductal dilatation. Edema is seen adjacent to the distal pancreatic tail extending to the medial margin of spleen and adjacent to the proximal descending thoracic:. Spleen: Unremarkable Adrenals/Urinary Tract: Adrenal glands, kidneys, ureters, and bladder normal appearance Stomach/Bowel: Appendix surgically absent. Minimal sigmoid diverticulosis without evidence of diverticulitis. Questionable rectal wall thickening versus artifact from underdistention. Stomach decompressed. Remaining bowel loops unremarkable. Vascular/Lymphatic: Scattered mild atherosclerotic calcifications aorta, iliac arteries, and coronary arteries. Aorta normal caliber. Circumaortic LEFT renal vein. No adenopathy. Reproductive: Uterus surgically absent with nonvisualization of ovaries Other: No free air or free fluid.  No hernia. Musculoskeletal: Bones demineralized. IMPRESSION: Bilobed cystic mass at body of pancreas 3.1 x 2.4 x 3.1 cm and 2.2 x 1.5 x 1.7 cm, new since 2012. Differential diagnosis includes pseudocyst, cystic pancreatic neoplasm, necrotic pancreatic neoplasm, abscess; recommend follow-up non emergent MR imaging with and without contrast to characterize. Mild edema along distal pancreatic tail extending to splenic hilum, could represent distal pancreatitis, recommend correlation with serum lipase. Minimal sigmoid diverticulosis without evidence of diverticulitis. Questionable rectal wall thickening versus artifact from underdistention, recommend correlation with digital rectal exam and proctoscopy. Hepatic steatosis. Small BILATERAL pleural effusions and bibasilar atelectasis greater on LEFT. Aortic Atherosclerosis (ICD10-I70.0). Electronically Signed   By: Lavonia Dana M.D.   On: 05/12/2020 12:34   CARDIAC  CATHETERIZATION  Result Date: 04/26/2020  Ost LM lesion is 25% stenosed.  Prox LAD to Mid LAD lesion is 75% stenosed.  1st Diag lesion is 90% stenosed.  Ost Cx to Prox Cx lesion is 80% stenosed.  1st Mrg lesion is 95% stenosed.  RPDA lesion is 90% stenosed.  1st RPL lesion is 90% stenosed.  The left ventricular systolic function is normal.  LV end diastolic pressure is normal.  The left ventricular ejection fraction is 55-65% by visual estimate.  1. Severe 3 vessel obstructive CAD 2. Normal LV function 3. Normal LVEDP Plan: would consider revascularization with CABG.   MR 3D Recon At Scanner  Result Date: 05/13/2020 CLINICAL DATA:  Pancreatic lesion on CT EXAM: MRI ABDOMEN WITHOUT AND WITH CONTRAST TECHNIQUE: Multiplanar multisequence MR imaging of the abdomen was performed both before and after the administration of intravenous contrast. CONTRAST:  66mL GADAVIST GADOBUTROL 1 MMOL/ML IV SOLN COMPARISON:  CT abdomen/pelvis dated 05/12/2020 FINDINGS: Motion degraded images. Lower chest: Moderate left and small right pleural effusions. Associated lower lobe opacities, likely atelectasis. Hepatobiliary: Moderate to severe hepatic steatosis. Postcontrast evaluation of the liver is severely motion degraded, but there are no suspicious/enhancing hepatic lesions. Status post cholecystectomy. No intrahepatic ductal dilatation. Dilated common duct, measuring 11 mm (series 21/image 5), and mildly tapering at the ampulla, likely postsurgical. No choledocholithiasis is seen. Pancreas: Bilobed cystic lesion along the pancreatic body, measuring approximately 1.9 x 2.8 x 5.0 cm (series 7/image 22; series 21/image 9). No convincing enhancement following contrast administration, noting motion degradation. This appearance  favors a benign pseudocyst. No pancreatic atrophy or ductal dilatation. Mild peripancreatic edema/inflammatory changes along the pancreatic tail (series 7/image 20), suggesting mild acute  pancreatitis, although poorly evaluated due to motion degradation. Spleen:  Within normal limits. Adrenals/Urinary Tract:  Adrenal glands are within normal limits. Kidneys are within normal limits.  No hydronephrosis. Stomach/Bowel: Stomach and visualized bowel are grossly unremarkable. Vascular/Lymphatic:  No evidence of abdominal aortic aneurysm. Other: Mild upper abdominal ascites along the pancreatic tail, as noted above. Musculoskeletal: No focal osseous lesions. IMPRESSION: Mild peripancreatic edema/inflammatory changes along the pancreatic tail, suggesting mild acute pancreatitis, although poorly evaluated due to motion degradation. Given the focal appearance and the normal laboratory evaluation, follow-up MR abdomen with/without contrast is suggested in 6-12 weeks to exclude an underlying lesion. 5.0 cm bilobed cystic lesion along the pancreatic body, favoring a benign pseudocyst. However, given limitations of the current evaluation, attention at the time of follow-up is suggested. Common duct measures 11 mm, likely postsurgical. No choledocholithiasis is seen. Moderate to severe hepatic steatosis. Moderate left and small right pleural effusions. Associated lower lobe opacities, likely atelectasis. Electronically Signed   By: Julian Hy M.D.   On: 05/13/2020 11:46   CT CORONARY MORPH W/CTA COR W/SCORE W/CA W/CM &/OR WO/CM  Addendum Date: 04/21/2020   ADDENDUM REPORT: 04/21/2020 15:58 CLINICAL DATA:  74 year old female with h/o hyperlipidemia, paroxysmal atrial fibrillation, esophageal dysmotility, DM 2, hypertension and exertional chest pain. EXAM: Cardiac/Coronary  CTA TECHNIQUE: The patient was scanned on a [employer]. FINDINGS: A 100 kV prospective scan was triggered in the descending thoracic aorta at 111 HU's. Axial non-contrast 3 mm slices were carried out through the heart. The data set was analyzed on a dedicated work station and scored using the [employer]. Gantry rotation  speed was 250 msecs and collimation was .6 mm. 50 mg of PO Metoprolol and 0.8 mg of sl NTG was given. The 3D data set was reconstructed in 5% intervals of the 67-82 % of the R-R cycle. Diastolic phases were analyzed on a dedicated work station using MPR, MIP and VRT modes. The patient received 80 cc of contrast. Aorta: Normal size. Minimal atherosclerotic plaque, no calcifications. No dissection. Aortic Valve:  Trileaflet.  No calcifications. Coronary Arteries:  Normal coronary origin.  Right dominance. RCA is a very large dominant artery that gives rise to PDA and PLA. There is mild calcified plaque in the ostial portion with stenosis 25-49%. Mid and distal RCA have only luminal irregularities. PDA and PLA have small lumen and mild diffuse disease. Left main is a moderate caliber artery that gives rise to LAD and LCX arteries. Left main has moderate mixed, predominantly calcified plaque that extends into the proximal LAD with stenosis 50-69%. LAD is a medium caliber artery that gives rise to one diagonal artery. Proximal LAD has a long heavily calcified plaque with suspicion for a focal stenosis > 70%. After this stenosis the lumen of mid and distal LAD is very small. D1 is a medium caliber artery that originated in the calcified segment of the proximal LAD that extends into the ostial portion of D1 and is suspicious for > 70% stenosis. LCX is a very small lumen non-dominant artery. Other findings: Normal pulmonary vein drainage into a large left atrium. Normal left atrial appendage without a thrombus. Normal size of the pulmonary artery. IMPRESSION: 1. Coronary calcium score of 122. This was 7 percentile for age and sex matched control. 2. Normal coronary origin with right dominance. 3. CAD-RADS 4  Severe stenosis in the distal left main and proximal LAD. (70-99% or > 50% left main). CT FFR is recommended. Consider symptom-guided anti-ischemic pharmacotherapy as well as risk factor modification per guideline  directed care. Electronically Signed   By: Ena Dawley   On: 04/21/2020 15:58   Result Date: 04/21/2020 EXAM: OVER-READ INTERPRETATION  CT CHEST The following report is an over-read performed by radiologist Dr. Vinnie Langton of [facility], [place] on 04/21/2020. This over-read does not include interpretation of cardiac or coronary anatomy or pathology. The coronary calcium score/coronary CTA interpretation by the cardiologist is attached. COMPARISON:  None. FINDINGS: Aortic atherosclerosis. Moderate-sized hiatal hernia. Within the visualized portions of the thorax there are no suspicious appearing pulmonary nodules or masses, there is no acute consolidative airspace disease, no pleural effusions, no pneumothorax and no lymphadenopathy. Visualized portions of the upper abdomen are unremarkable. There are no aggressive appearing lytic or blastic lesions noted in the visualized portions of the skeleton. IMPRESSION: 1.  Aortic Atherosclerosis (ICD10-I70.0). Electronically Signed: By: Vinnie Langton M.D. On: 04/21/2020 14:52   DG Chest Port 1 View  Result Date: 05/12/2020 CLINICAL DATA:  Cough, nausea and vomiting for 2 days. Recent bypass surgery EXAM: PORTABLE CHEST 1 VIEW COMPARISON:  05/04/2020 FINDINGS: Postoperative changes in the mediastinum. Shallow inspiration with linear atelectasis in the lung bases. Heart size is normal. No pleural effusions. No pneumothorax. Mediastinal contours appear intact. Degenerative changes in the spine and shoulders. Surgical clips in the right upper quadrant. IMPRESSION: Shallow inspiration with linear atelectasis in the lung bases. Electronically Signed   By: Lucienne Capers M.D.   On: 05/12/2020 05:18   DG Chest Port 1 View  Result Date: 05/04/2020 CLINICAL DATA:  Pleural effusion EXAM: PORTABLE CHEST 1 VIEW COMPARISON:  05/02/2019 FINDINGS: Elevation of the right hemidiaphragm is unchanged. Tiny left pleural effusion is present . Bibasilar atelectasis is  present. No pneumothorax. Median sternotomy has been performed. Cardiac size within normal limits. No acute bone abnormality. IMPRESSION: Tiny left pleural effusion.  Bibasilar atelectasis. Electronically Signed   By: Fidela Salisbury MD   On: 05/04/2020 05:42   DG Chest Port 1 View  Result Date: 04/30/2020 CLINICAL DATA:  Status post coronary artery bypass grafting. Atelectasis. EXAM: PORTABLE CHEST 1 VIEW COMPARISON:  April 29, 2020. FINDINGS: Cordis tip is in the superior vena cava. Chest tubes and mediastinal drain unchanged in position. No pneumothorax. There is atelectatic change in the lung base regions. No consolidation. Heart size and pulmonary vascularity within normal limits. No adenopathy. No bone lesions. IMPRESSION: Tube and catheter positions as described without pneumothorax. Stable bibasilar atelectatic change. No new opacity evident. Stable cardiac silhouette. Electronically Signed   By: Lowella Grip III M.D.   On: 04/30/2020 08:28   DG Chest Port 1 View  Result Date: 04/28/2020 CLINICAL DATA:  Bypass surgery. EXAM: PORTABLE CHEST 1 VIEW COMPARISON:  04/27/2020 FINDINGS: The endotracheal tube, NG tube, right IJ Swan-Ganz catheter, bilateral chest tubes and mediastinal drain tubes are stable. No complicating features. Streaky areas of subsegmental atelectasis but no edema, effusions or pneumothorax. IMPRESSION: Stable support apparatus. Streaky areas of subsegmental atelectasis. Electronically Signed   By: Marijo Sanes M.D.   On: 04/28/2020 06:37   DG Chest Port 1 View  Result Date: 04/27/2020 CLINICAL DATA:  Status post coronary bypass grafting EXAM: PORTABLE CHEST 1 VIEW COMPARISON:  04/25/2020 FINDINGS: Postsurgical changes are now seen consistent with the given clinical history. Endotracheal tube, gastric catheter, Swan-Ganz catheter and pericardial drain are  noted in satisfactory position. Bilateral thoracostomy tubes are seen. Inspiratory effort is poor although no  pneumothorax is seen. Minimal right midlung atelectasis is noted. No bony abnormality is seen. IMPRESSION: Tubes and lines in satisfactory position. Minimal right midlung atelectasis. Electronically Signed   By: Inez Catalina M.D.   On: 04/27/2020 14:56   CT CORONARY FRACTIONAL FLOW RESERVE DATA PREP  Result Date: 04/22/2020 EXAM: CT FFR ANALYSIS CLINICAL DATA:  74 year old female with abnormal CCTA. FINDINGS: FFRct analysis was performed on the original cardiac CT angiogram dataset. Diagrammatic representation of the FFRct analysis is provided in a separate PDF document in PACS. This dictation was created using the PDF document and an interactive 3D model of the results. 3D model is not available in the EMR/PACS. Normal FFR range is >0.80. 1. Left Main: 0.98. 2. LAD: Occluded at the proximal segment. 3. D1: Occluded at the ostium. 4. LCX: Very small nondominant artery. 5. RCA: Proximal: 0.98.  Distal: 0.97. IMPRESSION: 1. CT FFR analysis showed occluded proximal LAD and ostial portion of D1. Consider cardiac catheterization for further evaluation. Electronically Signed   By: Ena Dawley   On: 04/22/2020 11:27   ECHO INTRAOPERATIVE TEE  Result Date: 04/27/2020  *INTRAOPERATIVE TRANSESOPHAGEAL REPORT *  Patient Name:   [name] Date of Exam: 04/27/2020 Medical Rec #:  [id number]        Height:       63.0 in Accession #:    [id number]       Weight:       147.9 lb Date of Birth:  Mar 28, 1946        BSA:          1.70 m Patient Age:    62 years         BP:           113/68 mmHg Patient Gender: F                HR:           68 bpm. Exam Location:  Anesthesiology Transesophogeal exam was perform intraoperatively during surgical procedure. Patient was closely monitored under general anesthesia during the entirety of examination. Indications:     CAD Sonographer:     Dustin Flock RDCS Performing Phys: [id number] VVOHYWV Z ATKINS Diagnosing Phys: Annye Asa MD Complications: No known complications  during this procedure. POST-OP IMPRESSIONS Limited post-CPB exam: - Left Ventricle: has moderately reduced systolic function, with an ejection fraction of 30-40%. This continued to improve over time off of CPB. There are no regional wall motion abnormalities. - Aortic Valve: The aortic valve appears unchanged from pre-bypass images. - Mitral Valve: The mitral valve appears essentially unchanged from pre-bypass images. There is mild regurgitation. - Tricuspid Valve: The tricuspid valve appears unchanged from pre-bypass images. There is no TR. PRE-OP FINDINGS  Left Ventricle: The left ventricle has normal systolic function, with an ejection fraction of 60-65%, measured at 60%. The cavity size was normal. There is no increase in left ventricular wall thickness. No evidence of left ventricular regional wall motion abnormalities. Right Ventricle: The right ventricle has normal systolic function. The cavity was normal. There is no increase in right ventricular wall thickness. Right ventricular systolic pressure is normal. Catheter present in the right ventricle. Left Atrium: Left atrial size was normal in size. The left atrial appendage is well visualized and there is no evidence of thrombus present. Left atrial appendage velocity is normal at greater than 40 cm/s. Right Atrium: Right  atrial size was normal in size. Catheter present in the right atrium. Interatrial Septum: No atrial level shunt detected by color flow Doppler. Pericardium: There is no evidence of pericardial effusion. Mitral Valve: The mitral valve is normal in structure. No thickening of the mitral valve leaflets. No calcification of the mitral valve leaflets. Mitral valve regurgitation is trivial by color flow Doppler. Pulmonary venous flow is normal. There is no evidence of mitral stenosis, with peak gradient 3 mmHg, mean gradient 1 mmHg. Tricuspid Valve: The tricuspid valve was normal in structure. Tricuspid valve regurgitation was not visualized by  color flow Doppler. Leaflet motion is normal. There is no evidence of tricuspid valve vegetation. Aortic Valve: The aortic valve is tricuspid. Aortic valve regurgitation was not visualized by color flow Doppler. There is no evidence of aortic valve stenosis, with peak gradient 8 mmHg. There is no stenosis of the aortic valve. Pulmonic Valve: The pulmonic valve was normal in structure, with normal leaflet excursion. No evidence of pulmonic stenosis. Pulmonic valve regurgitation is trivial, around the PA catheter, by color flow Doppler. Aorta: The aortic root, ascending aorta and aortic arch are essentially normal in size and structure. Pulmonary Artery: Gordy Councilman catheter present on the left. The pulmonary artery is of normal size. Venous: The inferior vena cava is normal in size with greater than 50% respiratory variability, suggesting right atrial pressure of 3 mmHg. +--------------+-------++ LEFT VENTRICLE        +--------------+-------++ PLAX 2D               +--------------+-------++ LVIDd:        3.80 cm +--------------+-------++ LVIDs:        2.60 cm +--------------+-------++ LV SV:        37 ml   +--------------+-------++ LV SV Index:  21.44   +--------------+-------++                       +--------------+-------++ +-------------+-----------++ AORTIC VALVE             +-------------+-----------++ AV Vmax:     145.00 cm/s +-------------+-----------++ AV Peak Grad:8.4 mmHg    +-------------+-----------++ +-------------+---------++ MITRAL VALVE           +-------------+---------++ MV Peak grad:3.2 mmHg  +-------------+---------++ MV Mean grad:1.0 mmHg  +-------------+---------++ MV Vmax:     0.90 m/s  +-------------+---------++ MV Vmean:    55.2 cm/s +-------------+---------++ MV VTI:      0.33 m    +-------------+---------++  Annye Asa MD Electronically signed by Annye Asa MD Signature Date/Time: 04/27/2020/3:36:51 PM    Final     VAS US DOPPLER PRE CABG  Result Date: 04/28/2020 PREOPERATIVE VASCULAR EVALUATION  Indications:      Pre-CABG. Risk Factors:     Hypertension, Diabetes. Comparison Study: no prior Performing Technologist: Abram Sander RVS  Examination Guidelines: A complete evaluation includes B-mode imaging, spectral Doppler, color Doppler, and power Doppler as needed of all accessible portions of each vessel. Bilateral testing is considered an integral part of a complete examination. Limited examinations for reoccurring indications may be performed as noted.  Right Carotid Findings: +----------+--------+--------+--------+------------+--------+           PSV cm/sEDV cm/sStenosisDescribe    Comments +----------+--------+--------+--------+------------+--------+ CCA Prox  81      17              heterogenous         +----------+--------+--------+--------+------------+--------+ CCA Distal73      19  heterogenous         +----------+--------+--------+--------+------------+--------+ ICA Prox  71      22      1-39%   heterogenous         +----------+--------+--------+--------+------------+--------+ ICA Distal99      25                                   +----------+--------+--------+--------+------------+--------+ ECA       122     6                                    +----------+--------+--------+--------+------------+--------+ Portions of this table do not appear on this page. +----------+--------+-------+--------+------------+           PSV cm/sEDV cmsDescribeArm Pressure +----------+--------+-------+--------+------------+ Subclavian97                                  +----------+--------+-------+--------+------------+ +---------+--------+--+--------+--+---------+ VertebralPSV cm/s42EDV cm/s17Antegrade +---------+--------+--+--------+--+---------+ Left Carotid Findings: +----------+--------+--------+--------+------------+--------+           PSV cm/sEDV  cm/sStenosisDescribe    Comments +----------+--------+--------+--------+------------+--------+ CCA Prox  88      17              heterogenous         +----------+--------+--------+--------+------------+--------+ CCA Distal87      22              heterogenous         +----------+--------+--------+--------+------------+--------+ ICA Prox  105     26      1-39%   heterogenous         +----------+--------+--------+--------+------------+--------+ ICA Distal107     30                                   +----------+--------+--------+--------+------------+--------+ ECA       103                                          +----------+--------+--------+--------+------------+--------+ +----------+--------+--------+--------+------------+ SubclavianPSV cm/sEDV cm/sDescribeArm Pressure +----------+--------+--------+--------+------------+           116                                  +----------+--------+--------+--------+------------+ +---------+--------+--+--------+--+---------+ VertebralPSV cm/s55EDV cm/s17Antegrade +---------+--------+--+--------+--+---------+  ABI Findings: +--------+------------------+-----+---------+--------+ Right   Rt Pressure (mmHg)IndexWaveform Comment  +--------+------------------+-----+---------+--------+ Brachial                       triphasic         +--------+------------------+-----+---------+--------+ +--------+------------------+-----+---------+-------+ Left    Lt Pressure (mmHg)IndexWaveform Comment +--------+------------------+-----+---------+-------+ Brachial                       triphasic        +--------+------------------+-----+---------+-------+  Right Doppler Findings: +--------+--------+-----+---------+--------+ Site    PressureIndexDoppler  Comments +--------+--------+-----+---------+--------+ Brachial             triphasic         +--------+--------+-----+---------+--------+ Radial                triphasic         +--------+--------+-----+---------+--------+  Ulnar                triphasic         +--------+--------+-----+---------+--------+  Left Doppler Findings: +--------+--------+-----+---------+--------+ Site    PressureIndexDoppler  Comments +--------+--------+-----+---------+--------+ Brachial             triphasic         +--------+--------+-----+---------+--------+ Radial               triphasic         +--------+--------+-----+---------+--------+ Ulnar                triphasic         +--------+--------+-----+---------+--------+ Technologist Notes: Largest radial: .192x.855 smallest radial: .164x1.07. Largest radial:.210 x.384 smallest radial:.186x1.29.  Summary: Right Carotid: Velocities in the right ICA are consistent with a 1-39% stenosis. Left Carotid: Velocities in the left ICA are consistent with a 1-39% stenosis. Vertebrals: Bilateral vertebral arteries demonstrate antegrade flow. Right Upper Extremity: Unable to obtain allens test due to tr band. Left Upper Extremity: Doppler waveform obliterate with left radial compression. Doppler waveform obliterate with left ulnar compression.  Electronically signed by Harold Barban MD on 04/28/2020 at 7:33:11 AM.    Final     Microbiology: Recent Results (from the past 240 hour(s))  Urine culture     Status: None   Collection Time: 05/12/20  4:53 AM   Specimen: Urine, Clean Catch  Result Value Ref Range Status   Specimen Description   Final    URINE, CLEAN CATCH Performed at [facility], [address], [place], [place] [address]    Special Requests   Final    NONE Performed at [facility], [address], [place], [place] [address]    Culture   Final    NO GROWTH Performed at [facility], [place] [address], [place], [place] [address]    Report Status 05/14/2020 FINAL  Final  Culture, blood (routine x 2)     Status: None (Preliminary result)   Collection Time: 05/12/20  5:07 AM    Specimen: BLOOD RIGHT HAND  Result Value Ref Range Status   Specimen Description BLOOD RIGHT HAND  Final   Special Requests   Final    BOTTLES DRAWN AEROBIC ONLY Blood Culture results may not be optimal due to an inadequate volume of blood received in culture bottles   Culture   Final    NO GROWTH 4 DAYS Performed at [facility], [address], [place], [place] [address]    Report Status PENDING  Incomplete  Culture, blood (routine x 2)     Status: None (Preliminary result)   Collection Time: 05/12/20  5:23 AM   Specimen: BLOOD RIGHT HAND  Result Value Ref Range Status   Specimen Description BLOOD RIGHT HAND  Final   Special Requests   Final    BOTTLES DRAWN AEROBIC AND ANAEROBIC Blood Culture results may not be optimal due to an inadequate volume of blood received in culture bottles   Culture   Final    NO GROWTH 4 DAYS Performed at [facility], [address] [place], [place] [address]    Report Status PENDING  Incomplete  Resp Panel by RT-PCR (Flu A&B, Covid) Nasopharyngeal Swab     Status: None   Collection Time: 05/12/20  6:17 AM   Specimen: Nasopharyngeal Swab; Nasopharyngeal(NP) swabs in vial transport medium  Result Value Ref Range Status   SARS Coronavirus 2 by RT PCR NEGATIVE NEGATIVE Final    Comment: (  NOTE) SARS-CoV-2 target nucleic acids are NOT DETECTED.  The SARS-CoV-2 RNA is generally detectable in upper respiratory specimens during the acute phase of infection. The lowest concentration of SARS-CoV-2 viral copies this assay can detect is 138 copies/mL. A negative result does not preclude SARS-Cov-2 infection and should not be used as the sole basis for treatment or other patient management decisions. A negative result may occur with  improper specimen collection/handling, submission of specimen other than nasopharyngeal swab, presence of viral mutation(s) within the areas targeted by this assay, and inadequate number of viral copies(<138 copies/mL). A negative  result must be combined with clinical observations, patient history, and epidemiological information. The expected result is Negative.  Fact Sheet for Patients:  EntrepreneurPulse.com.au  Fact Sheet for Healthcare Providers:  IncredibleEmployment.be  This test is no t yet approved or cleared by the [place] FDA and  has been authorized for detection and/or diagnosis of SARS-CoV-2 by FDA under an Emergency Use Authorization (EUA). This EUA will remain  in effect (meaning this test can be used) for the duration of the COVID-19 declaration under Section 564(b)(1) of the Act, 21 U.S.C.section 360bbb-3(b)(1), unless the authorization is terminated  or revoked sooner.       Influenza A by PCR NEGATIVE NEGATIVE Final   Influenza B by PCR NEGATIVE NEGATIVE Final    Comment: (NOTE) The Xpert Xpress SARS-CoV-2/FLU/RSV plus assay is intended as an aid in the diagnosis of influenza from Nasopharyngeal swab specimens and should not be used as a sole basis for treatment. Nasal washings and aspirates are unacceptable for Xpert Xpress SARS-CoV-2/FLU/RSV testing.  Fact Sheet for Patients: EntrepreneurPulse.com.au  Fact Sheet for Healthcare Providers: IncredibleEmployment.be  This test is not yet approved or cleared by the [place] FDA and has been authorized for detection and/or diagnosis of SARS-CoV-2 by FDA under an Emergency Use Authorization (EUA). This EUA will remain in effect (meaning this test can be used) for the duration of the COVID-19 declaration under Section 564(b)(1) of the Act, 21 U.S.C. section 360bbb-3(b)(1), unless the authorization is terminated or revoked.  Performed at [facility], [address] [place], [place] [address]   C Difficile Quick Screen w PCR reflex     Status: Abnormal   Collection Time: 05/13/20  4:19 PM   Specimen: STOOL  Result Value Ref Range Status   C Diff antigen  POSITIVE (A) NEGATIVE Final   C Diff toxin NEGATIVE NEGATIVE Final   C Diff interpretation Results are indeterminate. See PCR results.  Final    Comment: Performed at [facility] [address] [place], [place] [address]  C. Diff by PCR, Reflexed     Status: Abnormal   Collection Time: 05/13/20  4:19 PM  Result Value Ref Range Status   Toxigenic C. Difficile by PCR POSITIVE (A) NEGATIVE Final    Comment: Positive for toxigenic C. difficile with little to no toxin production. Only treat if clinical presentation suggests symptomatic illness. Performed at [facility], [place] [address] [place], [place] [address]   Gastrointestinal Panel by PCR , Stool     Status: None   Collection Time: 05/13/20  4:20 PM   Specimen: STOOL  Result Value Ref Range Status   Campylobacter species NOT DETECTED NOT DETECTED Final   Plesimonas shigelloides NOT DETECTED NOT DETECTED Final   Salmonella species NOT DETECTED NOT DETECTED Final   Yersinia enterocolitica NOT DETECTED NOT DETECTED Final   Vibrio species NOT DETECTED NOT DETECTED Final   Vibrio cholerae  NOT DETECTED NOT DETECTED Final   Enteroaggregative E coli (EAEC) NOT DETECTED NOT DETECTED Final   Enteropathogenic E coli (EPEC) NOT DETECTED NOT DETECTED Final   Enterotoxigenic E coli (ETEC) NOT DETECTED NOT DETECTED Final   Shiga like toxin producing E coli (STEC) NOT DETECTED NOT DETECTED Final   Shigella/Enteroinvasive E coli (EIEC) NOT DETECTED NOT DETECTED Final   Cryptosporidium NOT DETECTED NOT DETECTED Final   Cyclospora cayetanensis NOT DETECTED NOT DETECTED Final   Entamoeba histolytica NOT DETECTED NOT DETECTED Final   Giardia lamblia NOT DETECTED NOT DETECTED Final   Adenovirus F40/41 NOT DETECTED NOT DETECTED Final   Astrovirus NOT DETECTED NOT DETECTED Final   Norovirus GI/GII NOT DETECTED NOT DETECTED Final   Rotavirus A NOT DETECTED NOT DETECTED Final   Sapovirus (I, II, IV, and V) NOT DETECTED NOT DETECTED Final     Comment: Performed at [facility], [address] [place], [place] [address]     Labs: Basic Metabolic Panel: Recent Labs  Lab 05/12/20 0358 05/12/20 0926 05/13/20 0456 05/14/20 1429 05/16/20 1443 05/17/20 0537  NA 134*  --  138 137 138 139  K 3.7  --  3.5 3.0* 3.5 3.6  CL 99  --  107 105 104 102  CO2 21*  --  21* 25 26 28   GLUCOSE 218*  --  85 183* 183* 128*  BUN 14  --  9 8 <5* <5*  CREATININE 0.65  --  0.49 0.52 0.49 0.50  CALCIUM 9.2  --  8.5* 8.5* 8.4* 8.6*  MG  --  2.2  --   --   --   --    Liver Function Tests: Recent Labs  Lab 05/12/20 0358 05/13/20 0456 05/14/20 1429 05/16/20 1443  AST 16 19 21 19   ALT 15 14 13 12   ALKPHOS 100 84 90 95  BILITOT 0.4 0.4 <0.1* 0.2*  PROT 7.0 5.6* 5.6* 5.3*  ALBUMIN 3.3* 2.6* 2.6* 2.4*   Recent Labs  Lab 05/12/20 0358  LIPASE 12   No results for input(s): AMMONIA in the last 168 hours. CBC: Recent Labs  Lab 05/12/20 0358 05/13/20 0456 05/14/20 1429  WBC 28.3* 14.0* 8.4  HGB 12.9 10.5* 10.9*  HCT 41.2 34.7* 35.7*  MCV 87.5 90.1 89.0  PLT 693* 543* 531*    BNP (last 3 results) Recent Labs    04/25/20 2341  BNP 34.7    ProBNP (last 3 results) No results for input(s): PROBNP in the last 8760 hours.  CBG: Recent Labs  Lab 05/16/20 0756 05/16/20 1144 05/16/20 1733 05/17/20 0028 05/17/20 0513  GLUCAP 115* 194* 137* 135* 116*       Signed:  Oswald Hillock MD.  [facility] 05/17/2020, 9:20 AM

## 2020-05-17 NOTE — Telephone Encounter (Signed)
Per Roseanne Kaufman ,NP - please arrange a hospital  follow up with NUR in 4-6 weeks.

## 2020-05-17 NOTE — Telephone Encounter (Signed)
Please arrange hospital follow-up in 4-6 weeks with Dr. Laural Golden. Thanks!

## 2020-05-18 DIAGNOSIS — Z299 Encounter for prophylactic measures, unspecified: Secondary | ICD-10-CM | POA: Diagnosis not present

## 2020-05-18 DIAGNOSIS — I4891 Unspecified atrial fibrillation: Secondary | ICD-10-CM | POA: Diagnosis not present

## 2020-05-18 DIAGNOSIS — E1142 Type 2 diabetes mellitus with diabetic polyneuropathy: Secondary | ICD-10-CM | POA: Diagnosis not present

## 2020-05-18 DIAGNOSIS — E1165 Type 2 diabetes mellitus with hyperglycemia: Secondary | ICD-10-CM | POA: Diagnosis not present

## 2020-05-19 ENCOUNTER — Ambulatory Visit (INDEPENDENT_AMBULATORY_CARE_PROVIDER_SITE_OTHER): Payer: Medicare Other | Admitting: Family Medicine

## 2020-05-19 ENCOUNTER — Encounter: Payer: Self-pay | Admitting: Family Medicine

## 2020-05-19 VITALS — BP 110/64 | HR 72 | Ht 63.0 in | Wt 142.8 lb

## 2020-05-19 DIAGNOSIS — E782 Mixed hyperlipidemia: Secondary | ICD-10-CM

## 2020-05-19 DIAGNOSIS — E1159 Type 2 diabetes mellitus with other circulatory complications: Secondary | ICD-10-CM

## 2020-05-19 DIAGNOSIS — I4891 Unspecified atrial fibrillation: Secondary | ICD-10-CM | POA: Diagnosis not present

## 2020-05-19 DIAGNOSIS — I2510 Atherosclerotic heart disease of native coronary artery without angina pectoris: Secondary | ICD-10-CM | POA: Diagnosis not present

## 2020-05-19 DIAGNOSIS — Z951 Presence of aortocoronary bypass graft: Secondary | ICD-10-CM | POA: Diagnosis not present

## 2020-05-19 NOTE — Progress Notes (Addendum)
Cardiology Office Note  Date: 05/19/2020   ID: Katrina Castro, DOB October 14, 1945, MRN [id number]  PCP:  Glenda Chroman, MD  Cardiologist:  Dorris Carnes, MD Electrophysiologist:  None   Chief Complaint: Hospital follow up status post CABG   History of Present Illness: Katrina Castro is a 74 y.o. female with a history of CAD status post CABG x 5. DM2, Atrial fibrillation, USAP.  Last encounter with Dr. Harrington Challenger on 03/31/2020 with complaints of shortness of breath.  She had been having spells with left-sided chest pain with exertion that was sharp radiating to left shoulder lasting several minutes accompanied by diaphoresis.  She was started on isosorbide 10 mg p.o. twice daily.  She continued to have these spells and had cut back on activity due to exacerbating symptoms.  Stated the isosorbide did not help much.  CT angiogram with FFR was ordered by Dr Harrington Challenger  This was abnormal.  She was contacted and instructed to present to [name] emergency department.  She underwent a cardiac catheterization demonstrating ostial left main lesion 25%, proximal LAD to mid LAD 75%, first diagonal lesion 90%, ostial circumflex to proximal circumflex 80%, first marginal lesion 95%, RPDA lesion 90%, first RPL lesion 90%.  She was referred to CTS for possible bypass.  On 04/27/2020 underwent 5 vessel bypass including: LIMA to distal LAD, SVG to RPDA  and PLA as a sequenced graft, left radial artery graft to ramus intermedius, and first obtuse marginal branch of LCx as a sequenced graft. She was discharged 05/09/2020. Hospital stay was prolonged due to diarrhea and UTI.  She presented to [facility] emergency department on 05/12/2020. Had complained of extreme weakness, marked decrease in appetite and feeling bad. Complained of some abdominal discomfort and was given Miralax by her daughter Her temperature was increased at 100.8. She was admitted for diarrhea, pancreatitis, and possible pseudocyst of the tail of the  pancreas.  She is here status post 5 vessel CABG on 04/27/2020.  States she feels very weak and has no energy at all.  States she feels like she can barely function.  Denies any anginal symptoms.  Does complain of activity intolerance, fatigue, and some dyspnea on exertion.  Denies any palpitations or arrhythmias, orthostatic symptoms, CVA or TIA-like symptoms, PND, orthopnea, bleeding.  Denies any claudication-like symptoms, DVT or PE-like symptoms.  Her median sternotomy site as well as left radial artery site are healing well without signs of infection.  Her right saphenous vein graft harvesting site is clean and dry.  She states she is frustrated that no one had found her problem earlier.  She requested a copy of her cardiac catheterization report.  She asked me to contact her daughter regarding the visit today.  Past Medical History:  Diagnosis Date  . Arthritis   . Concussion    2015  . Depression   . Diabetes ([place])    type 2  . Dysphagia   . Dysrhythmia    afib  . GERD (gastroesophageal reflux disease)   . Headache   . History of bronchitis   . Hypertension   . Seasonal allergies   . Toenail fungus     Past Surgical History:  Procedure Laterality Date  . APPENDECTOMY    . BACK SURGERY     x2  . BOTOX INJECTION N/A 06/06/2016   Procedure: BOTOX INJECTION;  Surgeon: Rogene Houston, MD;  [facility];  Service: Endoscopy;  Laterality: N/A;  . CHOLECYSTECTOMY    .  COLONOSCOPY N/A 11/05/2012   Procedure: COLONOSCOPY;  Surgeon: Rogene Houston, MD;  [facility];  Service: Endoscopy;  Laterality: N/A;  1030  . COLONOSCOPY WITH PROPOFOL N/A 05/01/2019   Procedure: COLONOSCOPY WITH PROPOFOL;  Surgeon: Rogene Houston, MD;  [facility];  Service: Endoscopy;  Laterality: N/A;  11:20am  . CORONARY ARTERY BYPASS GRAFT N/A 04/27/2020   Procedure: CORONARY ARTERY BYPASS GRAFTING (CABG) TIMES FIVE USING LEFT INTERNAL MAMMARY ARTERY, LEFT HARVESTED RADIAL  ARTERY, RIGHT GREATER SAPHENOUS VEIN HARVESTED ENDOSCOPICALLY.;  Surgeon: [name], MD;  Location: [place];  Service: Open Heart Surgery;  Laterality: N/A;  . ESOPHAGEAL DILATION N/A 07/06/2015   Procedure: ESOPHAGEAL DILATION;  Surgeon: Rogene Houston, MD;  [facility];  Service: Endoscopy;  Laterality: N/A;  . ESOPHAGEAL DILATION N/A 06/06/2016   Procedure: ESOPHAGEAL DILATION;  Surgeon: Rogene Houston, MD;  [facility];  Service: Endoscopy;  Laterality: N/A;  . ESOPHAGOGASTRODUODENOSCOPY N/A 02/12/2014   Procedure: ESOPHAGOGASTRODUODENOSCOPY (EGD);  Surgeon: Rogene Houston, MD;  [facility];  Service: Endoscopy;  Laterality: N/A;  150  . ESOPHAGOGASTRODUODENOSCOPY N/A 07/06/2015   Procedure: ESOPHAGOGASTRODUODENOSCOPY (EGD);  Surgeon: Rogene Houston, MD;  [facility];  Service: Endoscopy;  Laterality: N/A;  1:25 - moved to 1/18 @ 10:30 - Ann to notify pt  . ESOPHAGOGASTRODUODENOSCOPY (EGD) WITH ESOPHAGEAL DILATION N/A 07/25/2012   Procedure: ESOPHAGOGASTRODUODENOSCOPY (EGD) WITH ESOPHAGEAL DILATION;  Surgeon: Rogene Houston, MD;  [facility];  Service: Endoscopy;  Laterality: N/A;  325-rescheduled to [place] notified pt  . ESOPHAGOGASTRODUODENOSCOPY (EGD) WITH PROPOFOL N/A 06/06/2016   Procedure: ESOPHAGOGASTRODUODENOSCOPY (EGD) WITH PROPOFOL;  Surgeon: Rogene Houston, MD;  [facility];  Service: Endoscopy;  Laterality: N/A;  . EYE SURGERY     cataracts removed  . Foot surgeries Bilateral    hammer toes  . LEFT HEART CATH AND CORONARY ANGIOGRAPHY N/A 04/26/2020   Procedure: LEFT HEART CATH AND CORONARY ANGIOGRAPHY;  Surgeon: Martinique, Peter M, MD;  Location: [place] [facility];  Service: Cardiovascular;  Laterality: N/A;  . Venia Minks DILATION N/A 02/12/2014   Procedure: Venia Minks DILATION;  Surgeon: Rogene Houston, MD;  [facility];  Service: Endoscopy;  Laterality: N/A;  . POLYPECTOMY  05/01/2019    Procedure: POLYPECTOMY;  Surgeon: Rogene Houston, MD;  [facility];  Service: Endoscopy;;  colon   . RADIAL ARTERY HARVEST Left 04/27/2020   Procedure: LEFT RADIAL ARTERY HARVEST;  Surgeon: [name], MD;  Location: [place];  Service: Open Heart Surgery;  Laterality: Left;  . Right knee arthroscopy     x2  . TEE WITHOUT CARDIOVERSION N/A 04/27/2020   Procedure: TRANSESOPHAGEAL ECHOCARDIOGRAM (TEE);  Surgeon: [name], MD;  Location: [place];  Service: Open Heart Surgery;  Laterality: N/A;  . TONSILLECTOMY    . TOTAL ABDOMINAL HYSTERECTOMY    . TOTAL KNEE ARTHROPLASTY Right 04/03/2017   Procedure: RIGHT TOTAL KNEE ARTHROPLASTY;  Surgeon: Latanya Maudlin, MD;  Location: [facility];  Service: Orthopedics;  Laterality: Right;    Current Outpatient Medications  Medication Sig Dispense Refill  . acetaminophen (TYLENOL) 325 MG tablet Take 2 tablets (650 mg total) by mouth every 6 (six) hours as needed for mild pain.    Marland Kitchen ALPRAZolam (XANAX) 0.5 MG tablet Take 0.25 mg by mouth at bedtime as needed for anxiety.     Marland Kitchen amiodarone (PACERONE) 200 MG tablet Take  1 tablet (200 mg total) by mouth daily. 30 tablet 1  . aspirin EC 81 MG EC tablet Take 1 tablet (81 mg total) by mouth daily. Swallow whole. 30 tablet 11  . cetirizine (ZYRTEC) 10 MG tablet Take 10 mg by mouth daily.    . clopidogrel (PLAVIX) 75 MG tablet Take 1 tablet (75 mg total) by mouth daily. 30 tablet 1  . glimepiride (AMARYL) 2 MG tablet Take 2 mg by mouth daily with breakfast.     . isosorbide dinitrate (ISORDIL) 5 MG tablet Take 1 tablet (5 mg total) by mouth 3 (three) times daily. 90 tablet 0  . metoprolol (LOPRESSOR) 50 MG tablet Take 50 mg by mouth 2 (two) times daily.     Marland Kitchen oxybutynin (DITROPAN-XL) 10 MG 24 hr tablet Take 10 mg by mouth daily.    . pantoprazole (PROTONIX) 40 MG tablet Take 40 mg by mouth daily.    . rosuvastatin (CRESTOR) 40 MG tablet Take 1 tablet (40 mg total) by mouth daily. 30 tablet 1  .  traMADol (ULTRAM) 50 MG tablet Take 1 tablet (50 mg total) by mouth every 8 (eight) hours as needed for moderate pain. 30 tablet 0  . vancomycin (VANCOCIN) 250 MG capsule Take 2 capsules (500 mg total) by mouth 4 (four) times daily for 7 days. Through 05/24/2020 56 capsule 0  . Zinc Oxide (TRIPLE PASTE) 12.8 % ointment Apply topically as needed for irritation. 56.7 g 0   No current facility-administered medications for this visit.   Allergies:  Levofloxacin, Cardizem [diltiazem], Sulfa antibiotics, Diazepam, and Lipitor [atorvastatin]   Social History: The patient  reports that she has never smoked. She has never used smokeless tobacco. She reports that she does not drink alcohol and does not use drugs.   Family History: The patient's family history includes Diabetes in her mother and sister; Stroke in her father.   ROS:  Please see the history of present illness. Otherwise, complete review of systems is positive for none.  All other systems are reviewed and negative.   Physical Exam: VS:  BP 110/64   Pulse 72   Ht 5\' 3"  (1.6 m)   Wt 142 lb 12.8 oz (64.8 kg)   BMI 25.30 kg/m , BMI Body mass index is 25.3 kg/m.  Wt Readings from Last 3 Encounters:  05/19/20 142 lb 12.8 oz (64.8 kg)  05/12/20 137 lb 2 oz (62.2 kg)  05/09/20 142 lb 8 oz (64.6 kg)    General: Patient appears comfortable at rest. Neck: Supple, no elevated JVP or carotid bruits, no thyromegaly. Lungs: Clear to auscultation, nonlabored breathing at rest. Cardiac: Regular rate and rhythm, no S3 or significant systolic murmur, no pericardial rub. Extremities: No pitting edema, distal pulses 2+. Skin: Warm and dry.  Median sternotomy scar along with chest tube insertion sites and left radial artery harvesting site and right lower saphenous vein graft harvesting site are clean and dry. Musculoskeletal: No kyphosis. Neuropsychiatric: Alert and oriented x3, affect grossly appropriate.  ECG:  EKG 05/12/2020 showed sinus  tachycardia with a rate of 115, nonspecific T wave abnormality in anterior lateral leads.  Prolonged QT interval with QT/QTC of 371/514  Recent Labwork: 04/25/2020: B Natriuretic Peptide 34.7; TSH 5.542 05/12/2020: Magnesium 2.2 05/14/2020: Hemoglobin 10.9; Platelets 531 05/16/2020: ALT 12; AST 19 05/17/2020: BUN <5; Creatinine, Ser 0.50; Potassium 3.6; Sodium 139     Component Value Date/Time   CHOL 138 04/26/2020 0145   CHOL 217 (H) 03/31/2020 1208  TRIG 131 05/14/2020 0827   HDL 37 (L) 04/26/2020 0145   HDL 38 (L) 03/31/2020 1208   CHOLHDL 3.7 04/26/2020 0145   VLDL 19 04/26/2020 0145   LDLCALC 82 04/26/2020 0145   LDLCALC 144 (H) 03/31/2020 1208    Other Studies Reviewed Today:  04/27/2020 Coronary Artery Bypass Grafting x 5 Left Internal Mammary Artery to Distal Left Anterior Descending Coronary Artery; Saphenous Vein Graft to right posterior Descending Coronary Artery and posterior lateral artery as a sequenced graft; left radial artery graft to ramus intermedius and first obtuse Marginal Branch of Left Circumflex Coronary Artery as a sequenced graft; Endoscopic Vein Harvest from right thigh Open left radial artery harvesting    04/26/2020 LEFT HEART CATH AND CORONARY ANGIOGRAPHY  Conclusion    Ost LM lesion is 25% stenosed.  Prox LAD to Mid LAD lesion is 75% stenosed.  1st Diag lesion is 90% stenosed.  Ost Cx to Prox Cx lesion is 80% stenosed.  1st Mrg lesion is 95% stenosed.  RPDA lesion is 90% stenosed.  1st RPL lesion is 90% stenosed.  The left ventricular systolic function is normal.  LV end diastolic pressure is normal.  The left ventricular ejection fraction is 55-65% by visual estimate.   1. Severe 3 vessel obstructive CAD 2. Normal LV function 3. Normal LVEDP  Plan: would consider revascularization with CABG.  Diagnostic Dominance: Right   CT FFR 04/22/2020 Narrative & Impression  EXAM: CT FFR ANALYSIS  CLINICAL DATA:   74 year old female with abnormal CCTA.  FINDINGS: FFRct analysis was performed on the original cardiac CT angiogram dataset. Diagrammatic representation of the FFRct analysis is provided in a separate PDF document in PACS. This dictation was created using the PDF document and an interactive 3D model of the results. 3D model is not available in the EMR/PACS. Normal FFR range is >0.80.  1. Left Main: 0.98.  2. LAD: Occluded at the proximal segment. 3. D1: Occluded at the ostium. 4. LCX: Very small nondominant artery. 5. RCA: Proximal: 0.98.  Distal: 0.97.  IMPRESSION: 1. CT FFR analysis showed occluded proximal LAD and ostial portion of D1. Consider cardiac catheterization for further evaluation.       Echocardiogram 03/17/2020 1. Left ventricular ejection fraction, by estimation, is 55 to 60%. The left ventricle has normal function. The left ventricle has no regional wall motion abnormalities. There is mild left ventricular hypertrophy. Left ventricular diastolic parameters are indeterminate. 2. Right ventricular systolic function is normal. The right ventricular size is normal. Tricuspid regurgitation signal is inadequate for assessing PA pressure. 3. The mitral valve is grossly normal. Mild mitral valve regurgitation. 4. The aortic valve is tricuspid. Aortic valve regurgitation is not visualized. 5. The inferior vena cava is normal in size with <50% respiratory variability, suggesting right atrial pressure of 8 mmHg  Echocardiogram 05/18/16: Normal left ventricular systolic function, LVEF 20-35%, grade 1 diastolic dysfunction. The left atrium was normal in size.   Carotid doppler 04/28/2020 Right Carotid: Velocities in the right ICA are consistent with a 1-39% stenosis. Left Carotid: Velocities in the left ICA are consistent with a 1-39% stenosis. Vertebrals: Bilateral vertebral arteries demonstrate antegrade flow. Right Upper Extremity: Unable to obtain allens test due  to tr band. Left Upper Extremity: Doppler waveform obliterate with left radial compression. Doppler waveform obliterate with left ulnar compression    Nuclear stress test 07/10/2019:   There was no ST segment deviation noted during stress.  Defect 1: There is a medium defect of moderate severity  present in the mid inferoseptal, apical septal and apical inferior location. This is likely due to soft tissue attenuation as regional wall motion appears grossly normal.  This is a low risk study.  Nuclear stress EF: 62%.     Assessment and Plan:  1. CAD in native artery   2. Status post coronary artery bypass grafts x 5   3. Atrial fibrillation, unspecified type ([place])   4. Type 2 diabetes mellitus with other circulatory complication, without long-term current use of insulin ([place])   5. Mixed hyperlipidemia    1. CAD in native artery History of unstable angina and dyspnea with low risk stress test in January.  Recent CT with FFR showed occluded LAD at proximal segment, D1 occluded at ostium.  She was referred for cardiac catheterization.  Cardiac catheterization on 04/26/2020 showed ostial LM lesion 25%, proximal LAD to mid LAD 75%, first diagonal 90%, ostial circumflex to proximal circumflex 80%, first marginal lesion 95%, RPDA lesion 90%, first RPL 90%.  She is status post 5 vessel CABG.  Continue aspirin 81 mg daily, Plavix 75 mg daily, isosorbide dinitrate 5 mg 3 times daily.  Metoprolol 50 mg p.o. twice daily.  2. Status post coronary artery bypass grafts x 5 Patient had coronary artery bypass grafting x5 with LIMA to distal LAD, SVG to right posterior descending artery, posterolateral artery sequenced graft, left radial artery graft to ramus intermedius, first obtuse marginal of left circumflex artery in sequence graft.  Op report above.  She has a follow-up with Dr. Orvan Seen on December 6 at 3:30 PM.  States she feels she has no energy.  She is frustrated that her problem was not found  earlier per her statement today.  3. Atrial fibrillation, unspecified type [facility]) Patient had postop atrial fibrillation.  Continue amiodarone 200 mg p.o. daily.  Pulse today is regular at 72.  4. Type 2 diabetes mellitus with other circulatory complication, without long-term current use of insulin (HCC) Continue glimepiride 2 mg daily.  5. Mixed hyperlipidemia Continue Crestor 40 mg p.o. daily.  Medication Adjustments/Labs and Tests Ordered: Current medicines are reviewed at length with the patient today.  Concerns regarding medicines are outlined above.   Disposition: Follow-up with Dr. Harrington Challenger or APP 3 months  Signed, Levell July, NP 05/19/2020 11:35 AM    [facility] [place], [place],  [address] Phone: [phone]; Fax: [phone]

## 2020-05-19 NOTE — Patient Instructions (Signed)
Medication Instructions:  Your physician recommends that you continue on your current medications as directed. Please refer to the Current Medication list given to you today.   Labwork: None  Testing/Procedures: None  Follow-Up: Your physician recommends that you schedule a follow-up appointment in: 3 months  Any Other Special Instructions Will Be Listed Below (If Applicable).     If you need a refill on your cardiac medications before your next appointment, please call your pharmacy.   

## 2020-05-23 ENCOUNTER — Ambulatory Visit (INDEPENDENT_AMBULATORY_CARE_PROVIDER_SITE_OTHER): Payer: Self-pay | Admitting: Cardiothoracic Surgery

## 2020-05-23 ENCOUNTER — Other Ambulatory Visit: Payer: Self-pay

## 2020-05-23 ENCOUNTER — Encounter: Payer: Self-pay | Admitting: Cardiothoracic Surgery

## 2020-05-23 ENCOUNTER — Ambulatory Visit
Admission: RE | Admit: 2020-05-23 | Discharge: 2020-05-23 | Disposition: A | Discharge status: Home / Self Care | Payer: Medicare Other | Source: Ambulatory Visit | Attending: Cardiothoracic Surgery | Admitting: Cardiothoracic Surgery

## 2020-05-23 VITALS — BP 124/78 | HR 77 | Resp 18 | Ht 63.0 in | Wt 135.0 lb

## 2020-05-23 DIAGNOSIS — Z951 Presence of aortocoronary bypass graft: Secondary | ICD-10-CM

## 2020-05-23 DIAGNOSIS — J90 Pleural effusion, not elsewhere classified: Secondary | ICD-10-CM | POA: Diagnosis not present

## 2020-05-23 DIAGNOSIS — J9811 Atelectasis: Secondary | ICD-10-CM | POA: Diagnosis not present

## 2020-05-24 DIAGNOSIS — Z299 Encounter for prophylactic measures, unspecified: Secondary | ICD-10-CM | POA: Diagnosis not present

## 2020-05-24 DIAGNOSIS — B029 Zoster without complications: Secondary | ICD-10-CM | POA: Diagnosis not present

## 2020-05-24 NOTE — Progress Notes (Signed)
[address]       [place] [address]             [facility] OFFICE NOTE  Referring Provider is Fay Records, MD Primary Cardiologist is Dorris Carnes, MD PCP is Glenda Chroman, MD   HPI: 74 yo lady s/p CABG x 5 04/27/20. Presents for 1st visit outpatient after surgery. She was treated for UTI with IV abx and was actually [facility]admitted to [facility] for presumed C diff. She has been doing fine from a cardiovascular standpoint. Denies CP or SOB.     Current Outpatient Medications  Medication Sig Dispense Refill  . acetaminophen (TYLENOL) 325 MG tablet Take 2 tablets (650 mg total) by mouth every 6 (six) hours as needed for mild pain.    Marland Kitchen ALPRAZolam (XANAX) 0.5 MG tablet Take 0.25 mg by mouth at bedtime as needed for anxiety.     Marland Kitchen amiodarone (PACERONE) 200 MG tablet Take 1 tablet (200 mg total) by mouth daily. 30 tablet 1  . aspirin EC 81 MG EC tablet Take 1 tablet (81 mg total) by mouth daily. Swallow whole. 30 tablet 11  . cetirizine (ZYRTEC) 10 MG tablet Take 10 mg by mouth daily.    . clopidogrel (PLAVIX) 75 MG tablet Take 1 tablet (75 mg total) by mouth daily. 30 tablet 1  . glimepiride (AMARYL) 2 MG tablet Take 2 mg by mouth daily with breakfast.     . isosorbide dinitrate (ISORDIL) 5 MG tablet Take 1 tablet (5 mg total) by mouth 3 (three) times daily. 90 tablet 0  . metoprolol (LOPRESSOR) 50 MG tablet Take 50 mg by mouth 2 (two) times daily.     Marland Kitchen oxybutynin (DITROPAN-XL) 10 MG 24 hr tablet Take 10 mg by mouth daily.    . pantoprazole (PROTONIX) 40 MG tablet Take 40 mg by mouth daily.    . rosuvastatin (CRESTOR) 40 MG tablet Take 1 tablet (40 mg total) by mouth daily. 30 tablet 1  . traMADol (ULTRAM) 50 MG tablet Take 1 tablet (50 mg total) by mouth every 8 (eight) hours as needed for moderate pain. 30 tablet 0  . vancomycin (VANCOCIN) 250 MG capsule Take 2 capsules (500 mg total) by mouth 4 (four) times daily for 7 days. Through  05/24/2020 56 capsule 0  . Zinc Oxide (TRIPLE PASTE) 12.8 % ointment Apply topically as needed for irritation. 56.7 g 0   No current facility-administered medications for this visit.      Physical Exam:   BP 124/78 (BP Location: Right Arm, Patient Position: Sitting)   Pulse 77   Resp 18   Ht 5\' 3"  (1.6 m)   Wt 61.2 kg   SpO2 95% Comment: RA with mask on  BMI 23.91 kg/m   General:  Well-appearing, NAD  Chest:   cta  CV:   rrr  Incisions:  Well-healed  Abdomen:  sntnd  Extremities:  No edema  Diagnostic Tests:  cxr with clear lung fields   Impression:  Doing well from surgical perspective.  Plan:  F/u with [place] as needed F/u with cardiology as scheduled.  I spent in excess of 20 minutes during the conduct of this office consultation and >50% of this time involved direct face-to-face encounter with the patient for counseling and/or coordination of their care.  Level 2                 10 minutes  Level 3                 15 minutes Level 4                 25 minutes Level 5                 40 minutes  B. Murvin Natal, MD 05/24/2020 10:59 AM

## 2020-05-26 DIAGNOSIS — B029 Zoster without complications: Secondary | ICD-10-CM | POA: Diagnosis not present

## 2020-05-26 DIAGNOSIS — Z299 Encounter for prophylactic measures, unspecified: Secondary | ICD-10-CM | POA: Diagnosis not present

## 2020-05-26 DIAGNOSIS — B0229 Other postherpetic nervous system involvement: Secondary | ICD-10-CM | POA: Diagnosis not present

## 2020-05-27 ENCOUNTER — Telehealth: Payer: Self-pay | Admitting: Internal Medicine

## 2020-05-27 NOTE — Telephone Encounter (Signed)
Returned call to patient.  Adv no lotions creams powders on sternal incision.  Nothing but soap and water.  She said it is dry.  Otherwise ok.

## 2020-05-27 NOTE — Telephone Encounter (Signed)
Katrina Castro is calling wanting to know if it is okay for her to put cream on her incision site from heart surgery. Please advise.

## 2020-06-01 DIAGNOSIS — Z48812 Encounter for surgical aftercare following surgery on the circulatory system: Secondary | ICD-10-CM | POA: Diagnosis not present

## 2020-06-02 DIAGNOSIS — E1142 Type 2 diabetes mellitus with diabetic polyneuropathy: Secondary | ICD-10-CM | POA: Diagnosis not present

## 2020-06-02 DIAGNOSIS — Z299 Encounter for prophylactic measures, unspecified: Secondary | ICD-10-CM | POA: Diagnosis not present

## 2020-06-02 DIAGNOSIS — E1165 Type 2 diabetes mellitus with hyperglycemia: Secondary | ICD-10-CM | POA: Diagnosis not present

## 2020-06-02 DIAGNOSIS — I739 Peripheral vascular disease, unspecified: Secondary | ICD-10-CM | POA: Diagnosis not present

## 2020-06-07 ENCOUNTER — Ambulatory Visit: Payer: Medicare Other | Admitting: Orthotics

## 2020-06-15 ENCOUNTER — Other Ambulatory Visit: Payer: Self-pay

## 2020-06-15 ENCOUNTER — Encounter: Payer: Self-pay | Admitting: Family Medicine

## 2020-06-15 ENCOUNTER — Ambulatory Visit (INDEPENDENT_AMBULATORY_CARE_PROVIDER_SITE_OTHER): Payer: Medicare Other | Admitting: Family Medicine

## 2020-06-15 VITALS — BP 110/60 | HR 63 | Resp 16 | Ht 63.0 in | Wt 140.8 lb

## 2020-06-15 DIAGNOSIS — I2510 Atherosclerotic heart disease of native coronary artery without angina pectoris: Secondary | ICD-10-CM

## 2020-06-15 DIAGNOSIS — E782 Mixed hyperlipidemia: Secondary | ICD-10-CM

## 2020-06-15 DIAGNOSIS — I4891 Unspecified atrial fibrillation: Secondary | ICD-10-CM

## 2020-06-15 DIAGNOSIS — Z951 Presence of aortocoronary bypass graft: Secondary | ICD-10-CM | POA: Diagnosis not present

## 2020-06-15 DIAGNOSIS — E1159 Type 2 diabetes mellitus with other circulatory complications: Secondary | ICD-10-CM | POA: Diagnosis not present

## 2020-06-15 MED ORDER — NAPROXEN 500 MG PO TABS: 500.0000 mg | ORAL_TABLET | Freq: Two times a day (BID) | ORAL | 0 refills | Status: DC

## 2020-06-15 NOTE — Patient Instructions (Addendum)
Medication Instructions:  Take Naproxen 500 mg twice a day for 5 days   Try over the counter Lidocaine 0.5 % cream to site  *If you need a refill on your cardiac medications before your next appointment, please call your pharmacy*   Lab Work: None today If you have labs (blood work) drawn today and your tests are completely normal, you will receive your results only by: Marland Kitchen MyChart Message (if you have MyChart) OR . A paper copy in the mail If you have any lab test that is abnormal or we need to change your treatment, we will call you to review the results.   Testing/Procedures: None today   Follow-Up: At [facility], you and your health needs are our priority.  As part of our continuing mission to provide you with exceptional heart care, we have created designated Provider Care Teams.  These Care Teams include your primary Cardiologist (physician) and Advanced Practice Providers (APPs -  Physician Assistants and Nurse Practitioners) who all work together to provide you with the care you need, when you need it.  We recommend signing up for the patient portal called "MyChart".  Sign up information is provided on this After Visit Summary.  MyChart is used to connect with patients for Virtual Visits (Telemedicine).  Patients are able to view lab/test results, encounter notes, upcoming appointments, etc.  Non-urgent messages can be sent to your provider as well.   To learn more about what you can do with MyChart, go to [email or web address].    Your next appointment:  Keep 08/31/2020 apt with Dr.Ross      Thank you for choosing [facility] !

## 2020-06-15 NOTE — Progress Notes (Signed)
Cardiology Office Note  Date: 06/15/2020   ID: Katrina Castro, DOB [birth year], MRN [id number]  PCP:  Katrina Specking, MD  Cardiologist:  Katrina Pates, MD Electrophysiologist:  None   Chief Complaint: Complaining of burning and itching at median sternotomy incision site.  History of Present Illness: Katrina Castro is a 74 y.o. female with a history of CAD status post CABG x 5. DM2, Atrial fibrillation, USAP.  Last encounter with Dr. Tenny Craw on 03/31/2020 with complaints of shortness of breath.  She had been having spells with left-sided chest pain with exertion that was sharp radiating to left shoulder lasting several minutes accompanied by diaphoresis.  She was started on isosorbide 10 mg p.o. twice daily.  She continued to have these spells and had cut back on activity due to exacerbating symptoms.  Stated the isosorbide did not help much.  CT angiogram with FFR was ordered by Dr Tenny Craw  This was abnormal.  She was contacted and instructed to present to [facility] emergency department.  She underwent a cardiac catheterization demonstrating ostial left main lesion 25%, proximal LAD to mid LAD 75%, first diagonal lesion 90%, ostial circumflex to proximal circumflex 80%, first marginal lesion 95%, RPDA lesion 90%, first RPL lesion 90%.  She was referred to CTS for possible bypass.  On 04/27/2020 underwent 5 vessel bypass including: LIMA to distal LAD, SVG to RPDA  and PLA as a sequenced graft, left radial artery graft to ramus intermedius, and first obtuse marginal branch of LCx as a sequenced graft. She was discharged 05/09/2020. Hospital stay was prolonged due to diarrhea and UTI.  She presented to [facility] emergency department on 05/12/2020. Had complained of extreme weakness, marked decrease in appetite and feeling bad. Complained of some abdominal discomfort and was given Miralax by her daughter Her temperature was increased at 100.8. She was admitted for diarrhea, pancreatitis, and possible  pseudocyst of the tail of the pancreas.   She was here last visit post 5 vessel CABG on 04/27/2020.  Stated she felt very weak and had no energy at all.  States she felt like she could barely function.  Denied any anginal symptoms.  Did complain of activity intolerance, fatigue, and some dyspnea on exertion.  Denied any palpitations or arrhythmias, orthostatic symptoms, CVA or TIA-like symptoms, PND, orthopnea, bleeding.  Denied any claudication-like symptoms, DVT or PE-like symptoms.  Her median sternotomy site as well as left radial artery site were healing well without signs of infection.  Her right saphenous vein graft harvesting site was clean and dry.  She stated she was frustrated that no one had found her problem earlier.  She requested a copy of her cardiac catheterization report.  She asked me to contact her daughter regarding the visit.  She is here today with complaints of itching and burning at the median sternotomy site.  States this been going on for over a week or 2.  She states she called on December 10 asking about whether she could put cream on her incision site from the heart surgery.  Registered nurse Mrs. Wilson advised no lotions or creams or powders.  She continues to complain of burning itching sensation.  Incision site is well-healed without any redness, swelling, or signs of infection.  Also complaining of some pain in her right upper anterior chest just below her clavicle which is aggravated by deep palpation.  She states this has been hurting for several days.  She is wondering if she may have  pulled something.  Otherwise she denies any anginal symptoms, exertional symptoms.  States she has been walking her dog and pulling her trash can to the curb and back to her house without any difficulties.  Past Medical History:  Diagnosis Date  . Arthritis   . Concussion    2015  . Depression   . Diabetes ([place])    type 2  . Dysphagia   . Dysrhythmia    afib  . GERD  (gastroesophageal reflux disease)   . Headache   . History of bronchitis   . Hypertension   . Seasonal allergies   . Toenail fungus     Past Surgical History:  Procedure Laterality Date  . APPENDECTOMY    . BACK SURGERY     x2  . BOTOX INJECTION N/A 06/06/2016   Procedure: BOTOX INJECTION;  Surgeon: Rogene Houston, MD;  [facility];  Service: Endoscopy;  Laterality: N/A;  . CHOLECYSTECTOMY    . COLONOSCOPY N/A 11/05/2012   Procedure: COLONOSCOPY;  Surgeon: Rogene Houston, MD;  [facility];  Service: Endoscopy;  Laterality: N/A;  1030  . COLONOSCOPY WITH PROPOFOL N/A 05/01/2019   Procedure: COLONOSCOPY WITH PROPOFOL;  Surgeon: Rogene Houston, MD;  [facility];  Service: Endoscopy;  Laterality: N/A;  11:20am  . CORONARY ARTERY BYPASS GRAFT N/A 04/27/2020   Procedure: CORONARY ARTERY BYPASS GRAFTING (CABG) TIMES FIVE USING LEFT INTERNAL MAMMARY ARTERY, LEFT HARVESTED RADIAL ARTERY, RIGHT GREATER SAPHENOUS VEIN HARVESTED ENDOSCOPICALLY.;  Surgeon: [name], MD;  Location: [place];  Service: Open Heart Surgery;  Laterality: N/A;  . ESOPHAGEAL DILATION N/A 07/06/2015   Procedure: ESOPHAGEAL DILATION;  Surgeon: Rogene Houston, MD;  [facility];  Service: Endoscopy;  Laterality: N/A;  . ESOPHAGEAL DILATION N/A 06/06/2016   Procedure: ESOPHAGEAL DILATION;  Surgeon: Rogene Houston, MD;  [facility];  Service: Endoscopy;  Laterality: N/A;  . ESOPHAGOGASTRODUODENOSCOPY N/A 02/12/2014   Procedure: ESOPHAGOGASTRODUODENOSCOPY (EGD);  Surgeon: Rogene Houston, MD;  [facility];  Service: Endoscopy;  Laterality: N/A;  150  . ESOPHAGOGASTRODUODENOSCOPY N/A 07/06/2015   Procedure: ESOPHAGOGASTRODUODENOSCOPY (EGD);  Surgeon: Rogene Houston, MD;  [facility];  Service: Endoscopy;  Laterality: N/A;  1:25 - moved to 1/18 @ 10:30 - Ann to notify pt  . ESOPHAGOGASTRODUODENOSCOPY (EGD) WITH ESOPHAGEAL DILATION N/A  07/25/2012   Procedure: ESOPHAGOGASTRODUODENOSCOPY (EGD) WITH ESOPHAGEAL DILATION;  Surgeon: Rogene Houston, MD;  [facility];  Service: Endoscopy;  Laterality: N/A;  325-rescheduled to [place] notified pt  . ESOPHAGOGASTRODUODENOSCOPY (EGD) WITH PROPOFOL N/A 06/06/2016   Procedure: ESOPHAGOGASTRODUODENOSCOPY (EGD) WITH PROPOFOL;  Surgeon: Rogene Houston, MD;  [facility];  Service: Endoscopy;  Laterality: N/A;  . EYE SURGERY     cataracts removed  . Foot surgeries Bilateral    hammer toes  . LEFT HEART CATH AND CORONARY ANGIOGRAPHY N/A 04/26/2020   Procedure: LEFT HEART CATH AND CORONARY ANGIOGRAPHY;  Surgeon: Martinique, Peter M, MD;  Location: [place] [facility];  Service: Cardiovascular;  Laterality: N/A;  . Venia Minks DILATION N/A 02/12/2014   Procedure: Venia Minks DILATION;  Surgeon: Rogene Houston, MD;  [facility];  Service: Endoscopy;  Laterality: N/A;  . POLYPECTOMY  05/01/2019   Procedure: POLYPECTOMY;  Surgeon: Rogene Houston, MD;  [facility];  Service: Endoscopy;;  colon   . RADIAL ARTERY HARVEST Left 04/27/2020   Procedure: LEFT RADIAL ARTERY  HARVEST;  Surgeon: [name], MD;  Location: [place];  Service: Open Heart Surgery;  Laterality: Left;  . Right knee arthroscopy     x2  . TEE WITHOUT CARDIOVERSION N/A 04/27/2020   Procedure: TRANSESOPHAGEAL ECHOCARDIOGRAM (TEE);  Surgeon: [name], MD;  Location: [place];  Service: Open Heart Surgery;  Laterality: N/A;  . TONSILLECTOMY    . TOTAL ABDOMINAL HYSTERECTOMY    . TOTAL KNEE ARTHROPLASTY Right 04/03/2017   Procedure: RIGHT TOTAL KNEE ARTHROPLASTY;  Surgeon: Latanya Maudlin, MD;  Location: [facility];  Service: Orthopedics;  Laterality: Right;    Current Outpatient Medications  Medication Sig Dispense Refill  . acetaminophen (TYLENOL) 325 MG tablet Take 2 tablets (650 mg total) by mouth every 6 (six) hours as needed for mild pain.    Marland Kitchen ALPRAZolam (XANAX) 0.5 MG tablet Take  0.25 mg by mouth at bedtime as needed for anxiety.     Marland Kitchen amiodarone (PACERONE) 200 MG tablet Take 1 tablet (200 mg total) by mouth daily. 30 tablet 1  . aspirin EC 81 MG EC tablet Take 1 tablet (81 mg total) by mouth daily. Swallow whole. 30 tablet 11  . cetirizine (ZYRTEC) 10 MG tablet Take 10 mg by mouth daily.    . clopidogrel (PLAVIX) 75 MG tablet Take 1 tablet (75 mg total) by mouth daily. 30 tablet 1  . glimepiride (AMARYL) 2 MG tablet Take 2 mg by mouth daily with breakfast.     . isosorbide dinitrate (ISORDIL) 5 MG tablet Take 1 tablet (5 mg total) by mouth 3 (three) times daily. 90 tablet 0  . metoprolol (LOPRESSOR) 50 MG tablet Take 50 mg by mouth 2 (two) times daily.     . naproxen (NAPROSYN) 500 MG tablet Take 1 tablet (500 mg total) by mouth 2 (two) times daily with a meal. 10 tablet 0  . oxybutynin (DITROPAN-XL) 10 MG 24 hr tablet Take 10 mg by mouth daily.    . pantoprazole (PROTONIX) 40 MG tablet Take 40 mg by mouth daily.    . rosuvastatin (CRESTOR) 40 MG tablet Take 1 tablet (40 mg total) by mouth daily. 30 tablet 1  . valACYclovir (VALTREX) 1000 MG tablet Take 1,000 mg by mouth 3 (three) times daily as needed.    . Zinc Oxide (TRIPLE PASTE) 12.8 % ointment Apply topically as needed for irritation. 56.7 g 0   No current facility-administered medications for this visit.   Allergies:  Levofloxacin, Cardizem [diltiazem], Sulfa antibiotics, Diazepam, and Lipitor [atorvastatin]   Social History: The patient  reports that she has never smoked. She has never used smokeless tobacco. She reports that she does not drink alcohol and does not use drugs.   Family History: The patient's family history includes Diabetes in her mother and sister; Stroke in her father.   ROS:  Please see the history of present illness. Otherwise, complete review of systems is positive for none.  All other systems are reviewed and negative.   Physical Exam: VS:  BP 110/60   Pulse 63   Resp 16   Ht 5\' 3"   (1.6 m)   Wt 140 lb 12.8 oz (63.9 kg)   SpO2 94%   BMI 24.94 kg/m , BMI Body mass index is 24.94 kg/m.  Wt Readings from Last 3 Encounters:  06/15/20 140 lb 12.8 oz (63.9 kg)  05/23/20 135 lb (61.2 kg)  05/19/20 142 lb 12.8 oz (64.8 kg)    General: Patient appears comfortable at rest. Neck: Supple,  no elevated JVP or carotid bruits, no thyromegaly. Lungs: Clear to auscultation, nonlabored breathing at rest. Cardiac: Regular rate and rhythm, no S3 or significant systolic murmur, no pericardial rub. Extremities: No pitting edema, distal pulses 2+. Skin: Warm and dry.  Median sternotomy scar along with chest tube insertion sites and left radial artery harvesting site and right lower saphenous vein graft harvesting site are clean and dry. Musculoskeletal: No kyphosis. Neuropsychiatric: Alert and oriented x3, affect grossly appropriate.  ECG:  EKG 05/12/2020 showed sinus tachycardia with a rate of 115, nonspecific T wave abnormality in anterior lateral leads.  Prolonged QT interval with QT/QTC of 371/514  Recent Labwork: 04/25/2020: B Natriuretic Peptide 34.7; TSH 5.542 05/12/2020: Magnesium 2.2 05/14/2020: Hemoglobin 10.9; Platelets 531 05/16/2020: ALT 12; AST 19 05/17/2020: BUN <5; Creatinine, Ser 0.50; Potassium 3.6; Sodium 139     Component Value Date/Time   CHOL 138 04/26/2020 0145   CHOL 217 (H) 03/31/2020 1208   TRIG 131 05/14/2020 0827   HDL 37 (L) 04/26/2020 0145   HDL 38 (L) 03/31/2020 1208   CHOLHDL 3.7 04/26/2020 0145   VLDL 19 04/26/2020 0145   LDLCALC 82 04/26/2020 0145   LDLCALC 144 (H) 03/31/2020 1208    Other Studies Reviewed Today:  04/27/2020 Coronary Artery Bypass Grafting x 5 Left Internal Mammary Artery to Distal Left Anterior Descending Coronary Artery; Saphenous Vein Graft to right posterior Descending Coronary Artery and posterior lateral artery as a sequenced graft; left radial artery graft to ramus intermedius and first obtuse Marginal Branch of Left  Circumflex Coronary Artery as a sequenced graft; Endoscopic Vein Harvest from right thigh Open left radial artery harvesting    04/26/2020 LEFT HEART CATH AND CORONARY ANGIOGRAPHY  Conclusion    Ost LM lesion is 25% stenosed.  Prox LAD to Mid LAD lesion is 75% stenosed.  1st Diag lesion is 90% stenosed.  Ost Cx to Prox Cx lesion is 80% stenosed.  1st Mrg lesion is 95% stenosed.  RPDA lesion is 90% stenosed.  1st RPL lesion is 90% stenosed.  The left ventricular systolic function is normal.  LV end diastolic pressure is normal.  The left ventricular ejection fraction is 55-65% by visual estimate.   1. Severe 3 vessel obstructive CAD 2. Normal LV function 3. Normal LVEDP  Plan: would consider revascularization with CABG.  Diagnostic Dominance: Right   CT FFR 04/22/2020 Narrative & Impression  EXAM: CT FFR ANALYSIS  CLINICAL DATA:  74 year old female with abnormal CCTA.  FINDINGS: FFRct analysis was performed on the original cardiac CT angiogram dataset. Diagrammatic representation of the FFRct analysis is provided in a separate PDF document in PACS. This dictation was created using the PDF document and an interactive 3D model of the results. 3D model is not available in the EMR/PACS. Normal FFR range is >0.80.  1. Left Main: 0.98.  2. LAD: Occluded at the proximal segment. 3. D1: Occluded at the ostium. 4. LCX: Very small nondominant artery. 5. RCA: Proximal: 0.98.  Distal: 0.97.  IMPRESSION: 1. CT FFR analysis showed occluded proximal LAD and ostial portion of D1. Consider cardiac catheterization for further evaluation.       Echocardiogram 03/17/2020 1. Left ventricular ejection fraction, by estimation, is 55 to 60%. The left ventricle has normal function. The left ventricle has no regional wall motion abnormalities. There is mild left ventricular hypertrophy. Left ventricular diastolic parameters are indeterminate. 2. Right ventricular  systolic function is normal. The right ventricular size is normal. Tricuspid regurgitation signal is inadequate  for assessing PA pressure. 3. The mitral valve is grossly normal. Mild mitral valve regurgitation. 4. The aortic valve is tricuspid. Aortic valve regurgitation is not visualized. 5. The inferior vena cava is normal in size with <50% respiratory variability, suggesting right atrial pressure of 8 mmHg  Echocardiogram 05/18/16: Normal left ventricular systolic function, LVEF [id number], grade 1 diastolic dysfunction. The left atrium was normal in size.   Carotid doppler 04/28/2020 Right Carotid: Velocities in the right ICA are consistent with a 1-39% stenosis. Left Carotid: Velocities in the left ICA are consistent with a 1-39% stenosis. Vertebrals: Bilateral vertebral arteries demonstrate antegrade flow. Right Upper Extremity: Unable to obtain allens test due to tr band. Left Upper Extremity: Doppler waveform obliterate with left radial compression. Doppler waveform obliterate with left ulnar compression    Nuclear stress test 07/10/2019:   There was no ST segment deviation noted during stress.  Defect 1: There is a medium defect of moderate severity present in the mid inferoseptal, apical septal and apical inferior location. This is likely due to soft tissue attenuation as regional wall motion appears grossly normal.  This is a low risk study.  Nuclear stress EF: 62%.     Assessment and Plan:   1. CAD in native artery History of unstable angina and dyspnea with low risk stress test in January.  Recent CT with FFR showed occluded LAD at proximal segment, D1 occluded at ostium.  She was referred for cardiac catheterization.  Cardiac catheterization on 04/26/2020 showed ostial LM lesion 25%, proximal LAD to mid LAD 75%, first diagonal 90%, ostial circumflex to proximal circumflex 80%, first marginal lesion 95%, RPDA lesion 90%, first RPL 90%.  She is status post 5 vessel CABG.   Continue aspirin 81 mg daily, Plavix 75 mg daily, isosorbide dinitrate 5 mg 3 times daily.  Metoprolol 50 mg p.o. twice daily.  2. Status post coronary artery bypass grafts x 5 Patient had coronary artery bypass grafting x5 with LIMA to distal LAD, SVG to right posterior descending artery, posterolateral artery sequenced graft, left radial artery graft to ramus intermedius, first obtuse marginal of left circumflex artery in sequence graft.  Op report above.  Median sternotomy site is well-healed without redness, swelling, infection.  Patient states the incision site has been burning and itching.  Advised her to go to the pharmacy and purchase OTC lidocaine cream and apply to site as needed for burning sensation.  Also complaining of left anterior chest wall pain aggravated by deep palpation.  Will prescribe naproxen 500 mg p.o. twice daily x5 days to relieve her chest wall pain.  3. Atrial fibrillation, unspecified type [facility]) Patient had postop atrial fibrillation.  Continue amiodarone 200 mg p.o. daily.  Pulse 63 today   4. Type 2 diabetes mellitus with other circulatory complication, without long-term current use of insulin (HCC) Continue glimepiride 2 mg daily.  5. Mixed hyperlipidemia Continue Crestor 40 mg p.o. daily.  Medication Adjustments/Labs and Tests Ordered: Current medicines are reviewed at length with the patient today.  Concerns regarding medicines are outlined above.   Disposition: Follow-up with Dr. Harrington Challenger at scheduled appointment on August 31, 2020 at [address] [place] July, NP 06/15/2020 4:14 PM    [facility] [place], [place], [place] [address] Phone: [phone]; Fax: [phone]

## 2020-06-16 ENCOUNTER — Telehealth: Payer: Self-pay | Admitting: *Deleted

## 2020-06-16 ENCOUNTER — Telehealth (INDEPENDENT_AMBULATORY_CARE_PROVIDER_SITE_OTHER): Payer: Self-pay | Admitting: Internal Medicine

## 2020-06-16 NOTE — Telephone Encounter (Signed)
Patient called the office regarding her upcoming appointment on 06/22/20 for 6 week follow up - patient refused to see Dr Levon Hedger - wanted to reschedule with Dr Karilyn Cota - I advised patient that Dr Cathie Beams next available appointment would be on 10/11/20 - wanted to be put on Dr Patty Sermons cancellation list

## 2020-06-16 NOTE — Telephone Encounter (Signed)
Spoke with Katrina Castro regarding a burning/itching sensation she is experiencing in the lower portion of her median sternotomy incision. Per pt she was seen by Rennis Harding, NP yesterday who prescribed her Naproxen. Pt states she only took one pill and it did not do anything for her. Advised pt to take the medication consistently as prescribed. Also advised pt to get OTC lidocaine cream that was also suggested to her during that visit. Pt verbalized understanding. No further questions at this time.

## 2020-06-17 DIAGNOSIS — E785 Hyperlipidemia, unspecified: Secondary | ICD-10-CM | POA: Diagnosis not present

## 2020-06-17 DIAGNOSIS — I10 Essential (primary) hypertension: Secondary | ICD-10-CM | POA: Diagnosis not present

## 2020-06-22 ENCOUNTER — Ambulatory Visit (INDEPENDENT_AMBULATORY_CARE_PROVIDER_SITE_OTHER): Payer: Medicare Other | Admitting: Gastroenterology

## 2020-07-11 ENCOUNTER — Encounter: Payer: Medicare Other | Attending: Cardiothoracic Surgery | Admitting: Nutrition

## 2020-07-11 ENCOUNTER — Other Ambulatory Visit: Payer: Self-pay

## 2020-07-11 DIAGNOSIS — E1169 Type 2 diabetes mellitus with other specified complication: Secondary | ICD-10-CM | POA: Insufficient documentation

## 2020-07-11 DIAGNOSIS — Z951 Presence of aortocoronary bypass graft: Secondary | ICD-10-CM | POA: Diagnosis not present

## 2020-07-11 DIAGNOSIS — E118 Type 2 diabetes mellitus with unspecified complications: Secondary | ICD-10-CM

## 2020-07-11 DIAGNOSIS — E782 Mixed hyperlipidemia: Secondary | ICD-10-CM

## 2020-07-11 NOTE — Progress Notes (Signed)
Medical Nutrition Therapy:  Appt start time: 1300 end time:  1400.   Assessment:  Primary concerns today: Diabetes Type 2 , CAD and Hyperlipidemia. She had CABG x 5 in November 2021..  DX 11.8 and E78.0  History of pancreatitis and possible pseudocyst on the tail of the pancreas per cardiology notes.  Takes Glimperide 5 mg BID. She is widowed but has a boyfriend. She doesn't like to cook much and they eat out some. Eats usually 2 meals per day. Doesn't eat meals on time. Walks her dog a little bit. NO other routine exericise. Has been tired and fatigued since her heart surgery.  Hasn't been testing blood sugars much. PCP Dr. Woody Seller.  Lab Results  Component Value Date   HGBA1C 6.4 (H) 05/12/2020   CMP Latest Ref Rng & Units 05/17/2020 05/16/2020 05/14/2020  Glucose 70 - 99 mg/dL 128(H) 183(H) 183(H)  BUN 8 - 23 mg/dL <5(L) <5(L) 8  Creatinine 0.44 - 1.00 mg/dL 0.50 0.49 0.52  Sodium 135 - 145 mmol/L 139 138 137  Potassium 3.5 - 5.1 mmol/L 3.6 3.5 3.0(L)  Chloride 98 - 111 mmol/L 102 104 105  CO2 22 - 32 mmol/L 28 26 25   Calcium 8.9 - 10.3 mg/dL 8.6(L) 8.4(L) 8.5(L)  Total Protein 6.5 - 8.1 g/dL - 5.3(L) 5.6(L)  Total Bilirubin 0.3 - 1.2 mg/dL - 0.2(L) <0.1(L)  Alkaline Phos 38 - 126 U/L - 95 90  AST 15 - 41 U/L - 19 21  ALT 0 - 44 U/L - 12 13   Lipid Panel     Component Value Date/Time   CHOL 138 04/26/2020 0145   CHOL 217 (H) 03/31/2020 1208   TRIG 131 05/14/2020 0827   HDL 37 (L) 04/26/2020 0145   HDL 38 (L) 03/31/2020 1208   CHOLHDL 3.7 04/26/2020 0145   VLDL 19 04/26/2020 0145   LDLCALC 82 04/26/2020 0145   LDLCALC 144 (H) 03/31/2020 1208   LABVLDL 35 03/31/2020 1208    Wt Readings from Last 3 Encounters:  06/15/20 140 lb 12.8 oz (63.9 kg)  05/23/20 135 lb (61.2 kg)  05/19/20 142 lb 12.8 oz (64.8 kg)   Ht Readings from Last 3 Encounters:  06/15/20 5\' 3"  (1.6 m)  05/23/20 5\' 3"  (1.6 m)  05/19/20 5\' 3"  (1.6 m)   There is no height or weight on file to calculate  BMI. @BMIFA @ Facility age limit for growth percentiles is 20 years. Facility age limit for growth percentiles is 20 years.   Preferred Learning Style:   No preference indicated   Learning Readiness:  Ready  Change in progress   MEDICATIONS:   DIETARY INTAKE:   24-hr recall:  B ( AM): 1/2 c cereal, milk 2%, 1/2 banana, black coffee and sugar Snk ( AM):  L ( PM): Skinny Popcorn 2 cups, sweet tea with sugar Snk ( PM): D ( PM): Spaghetti 1 cup, no meat, water Snk ( PM):  Beverages: water and sweet tea  Usual physical activity: ADL  Estimated energy needs: 1600-1800  calories 200 g carbohydrates 125 g protein 50 g fat  Progress Towards Goal(s):  In progress.   Nutritional Diagnosis:  NI-5.6.2 Excessive fat intake As related to CAD.  As evidenced by CABG x 5.  Knowledge deficit related to Type 2 Dm as evidenced by A1C 6.5% and on Glimperide.    Intervention:  . Nutrition and Diabetes education provided on My Plate, CHO counting, meal planning, portion sizes, timing of meals, avoiding snacks between  meals unless having a low blood sugar, target ranges for A1C and blood sugars, signs/symptoms and treatment of hyper/hypoglycemia, monitoring blood sugars, taking medications as prescribed, benefits of exercising 30 minutes per day and prevention of complications of DM.  Goals:   Test blood sugars before breakfast and before dinner Increase high fiber foods Cut out snacks-cheese and crackers and other processed snack foods. Eat meals on time Increase fresh fruits, whole grains and nutrient dense foods. Increase lean protein daily; avoid red meat. Get A1C down to 5.7% or below Talk to PCP to see if they can switch from Gliimperide to [facility] for better blood sugar control Don't skip meals Avoid fast foods and processed foods. Teaching Method Utilized:    Auditory- phone visit Handouts given during visit include:  Emailed/mailed My Plate, Diabetes  Instructions, Low Cholesterol Diet    Barriers to learning/adherence to lifestyle change: None  Demonstrated degree of understanding via:  Teach Bac Monitoring/Evaluation:  Dietary intake, exercise, blood sugars, and body weight in 1 month(s).  Recommend to change oral diabetes medication to Glipizide instead of Glimepiride for better BS control

## 2020-07-11 NOTE — Patient Instructions (Addendum)
Goals  Test blood sugars before breakfast and before dinner Increase high fiber foods Cut out snacks-cheese and crackers and other processed snack foods. Eat meals on time Increase fresh fruits, whole grains and nutrient dense foods. Increase lean protein daily; avoid red meat. Get A1C down to 5.7% or below Talk to PCP to see if they can switch from Gliimperide to [facility] for better blood sugar control Don't skip meals Avoid fast foods and processed foods.

## 2020-07-12 ENCOUNTER — Ambulatory Visit: Payer: Medicare Other | Admitting: Orthotics

## 2020-07-12 ENCOUNTER — Ambulatory Visit: Payer: Medicare Other | Admitting: Podiatry

## 2020-07-12 DIAGNOSIS — M2042 Other hammer toe(s) (acquired), left foot: Secondary | ICD-10-CM | POA: Diagnosis not present

## 2020-07-12 DIAGNOSIS — M7742 Metatarsalgia, left foot: Secondary | ICD-10-CM | POA: Diagnosis not present

## 2020-07-12 DIAGNOSIS — B351 Tinea unguium: Secondary | ICD-10-CM

## 2020-07-12 DIAGNOSIS — E1140 Type 2 diabetes mellitus with diabetic neuropathy, unspecified: Secondary | ICD-10-CM | POA: Diagnosis not present

## 2020-07-12 DIAGNOSIS — M79675 Pain in left toe(s): Secondary | ICD-10-CM | POA: Diagnosis not present

## 2020-07-12 DIAGNOSIS — M79674 Pain in right toe(s): Secondary | ICD-10-CM

## 2020-07-12 DIAGNOSIS — M7741 Metatarsalgia, right foot: Secondary | ICD-10-CM

## 2020-07-12 DIAGNOSIS — L84 Corns and callosities: Secondary | ICD-10-CM

## 2020-07-12 DIAGNOSIS — M2032 Hallux varus (acquired), left foot: Secondary | ICD-10-CM

## 2020-07-12 NOTE — Progress Notes (Signed)
Re ordered shoes different style as she didn't like the [employer] tennis shoes.

## 2020-07-13 ENCOUNTER — Encounter: Payer: Self-pay | Admitting: Nutrition

## 2020-07-14 ENCOUNTER — Telehealth: Payer: Self-pay | Admitting: Internal Medicine

## 2020-07-14 ENCOUNTER — Other Ambulatory Visit: Payer: Self-pay | Admitting: Physician Assistant

## 2020-07-14 NOTE — Progress Notes (Signed)
  Subjective:  Patient ID: Katrina Castro, female    DOB: 04-02-1946,  MRN: [id number]  Chief Complaint  Patient presents with  . Nail Problem    PT stated that she is still having some pain. Nail trim    75 y.o. female returns with the above complaint. History confirmed with patient.  Still having similar foot pain.  She has thickened elongated toenails she is unable to cut now.  She had heart attack and quadruple bypass surgery in October of last year since I last saw her Objective:  Physical Exam: warm, good capillary refill, no trophic changes or ulcerative lesions, normal DP and PT pulses.  She does have diffuse loss of protective sensation bilaterally.  Bilaterally there is thickening of all the toenails with elongation, discoloration and subungual debris Left Foot: Hallux varus and medial deviation of the digits.  Healed surgical scars on the medial foot and dorsal second and third toes.  Atrophy of the plantar metatarsal fat pad  Right Foot: Atrophy of the plantar metatarsal fat pad   Radiographs: Left foot hallux varus and medial deviation of the second and third MTPJ's with digital IPJ fusions of the second and third toes Assessment:   1. Acquired hallux varus of left foot   2. Hammertoe of left foot   3. Controlled type 2 diabetes with neuropathy ([place])   4. Metatarsalgia of both feet   5. Pain due to onychomycosis of toenails of both feet      Plan:  Patient was evaluated and treated and all questions answered.    -At this point I advised that we should continue with nonoperative treatment for now.  This is especially important in the setting of her recent heart attack.  I think she would be high at much higher risk for surgery now.  We will revisit this in the future.  Patient educated on diabetes. Discussed proper diabetic foot care and discussed risks and complications of disease. Educated patient in depth on reasons to return to the office immediately should he/she  discover anything concerning or new on the feet. All questions answered. Discussed proper shoes as well.   Discussed the etiology and treatment options for the condition in detail with the patient. Educated patient on the topical and oral treatment options for mycotic nails. Recommended debridement of the nails today. Sharp and mechanical debridement performed of all painful and mycotic nails today. Nails debrided in length and thickness using a nail nipper to level of comfort. Discussed treatment options including appropriate shoe gear. Follow up as needed for painful nails.   Return in about 3 months (around 10/10/2020).

## 2020-07-14 NOTE — Telephone Encounter (Signed)
Pt c/o medication issue:  1. Name of Medication: Plavix/clopidogrel 75mg   2. How are you currently taking this medication (dosage and times per day)? 75mg , 1 tablet daily   3. Are you having a reaction (difficulty breathing--STAT)? Still having chest pains and coughing at night  4. What is your medication issue?  Patient is currently out of the medication not sure if she should continue. This was prescribed by Dr. Olivia Mackie. She is also wanting to know if she should be seen before March by Dr. Harrington Challenger.

## 2020-07-15 ENCOUNTER — Ambulatory Visit (INDEPENDENT_AMBULATORY_CARE_PROVIDER_SITE_OTHER): Payer: Medicare Other | Admitting: Student

## 2020-07-15 ENCOUNTER — Other Ambulatory Visit: Payer: Self-pay

## 2020-07-15 ENCOUNTER — Encounter: Payer: Self-pay | Admitting: Student

## 2020-07-15 ENCOUNTER — Other Ambulatory Visit: Payer: Self-pay | Admitting: Internal Medicine

## 2020-07-15 VITALS — BP 140/68 | HR 62 | Ht 62.5 in | Wt 142.0 lb

## 2020-07-15 DIAGNOSIS — R079 Chest pain, unspecified: Secondary | ICD-10-CM

## 2020-07-15 DIAGNOSIS — I2510 Atherosclerotic heart disease of native coronary artery without angina pectoris: Secondary | ICD-10-CM

## 2020-07-15 DIAGNOSIS — I9789 Other postprocedural complications and disorders of the circulatory system, not elsewhere classified: Secondary | ICD-10-CM | POA: Diagnosis not present

## 2020-07-15 DIAGNOSIS — E782 Mixed hyperlipidemia: Secondary | ICD-10-CM | POA: Diagnosis not present

## 2020-07-15 DIAGNOSIS — I10 Essential (primary) hypertension: Secondary | ICD-10-CM | POA: Diagnosis not present

## 2020-07-15 DIAGNOSIS — Z79899 Other long term (current) drug therapy: Secondary | ICD-10-CM

## 2020-07-15 DIAGNOSIS — I4891 Unspecified atrial fibrillation: Secondary | ICD-10-CM | POA: Diagnosis not present

## 2020-07-15 MED ORDER — CLOPIDOGREL BISULFATE 75 MG PO TABS: 75.0000 mg | ORAL_TABLET | Freq: Every day | ORAL | 3 refills | Status: DC

## 2020-07-15 MED ORDER — NITROGLYCERIN 0.4 MG SL SUBL: 0.4000 mg | SUBLINGUAL_TABLET | SUBLINGUAL | 3 refills | Status: DC | PRN

## 2020-07-15 NOTE — Telephone Encounter (Signed)
Katrina Castro says she has continued chest soreness and cough especially at nite since CABG done 04/2020.   She can be seen today at 3 pm with B.Strader, PA-C

## 2020-07-15 NOTE — Progress Notes (Signed)
Cardiology Office Note    Date:  07/16/2020   ID:  Katrina Castro, DOB 1946/02/02, MRN [id number]  PCP:  Glenda Chroman, MD  Cardiologist: Dorris Carnes, MD    Chief Complaint  Patient presents with  . Follow-up    Incisional pain    History of Present Illness:    Katrina Castro is a 75 y.o. female with past medical history of CAD (s/p CABG on 04/27/2020 with LIMA-LAD, seq SVG-PDA-PL, seq left radial-RI-OM), HTN, HLD, and Type 2 DM who presents to the office today for evaluation of chest pain.   By review of notes, the patient was hospitalized for unstable angina in 04/2020 and was found to have three-vessel CAD and required CABG with details as outlined above. Her hospital course was complicated by intermittent diarrhea and a Pseudomonas UTI which was treated with antibiotic therapy. Was also diagnosed with postoperative atrial fibrillation during admission. She was hospitalized again at [facility] from 11/25 - 05/17/2020 for evaluation of abdominal pain and an MRI showed pancreatic inflammation but a mass could not be ruled out and repeat imaging was recommended in 6 to 12 weeks. Was also found to have possible C. difficile colitis and was treated with Vancomycin.  She did follow-up with Katina Dung, NP on 05/19/2020 reporting feeling very weak and fatigued. She denied any recurrent chest pain. No changes were made to her medication regimen at that time. She followed up again with Leonides Sake, NP on 06/15/2020 reporting itching and burning along her sternotomy site. She was advised to use OTC Lidocaine cream along with Naproxen 500 mg twice daily for 5 days.  In talking with the patient today, she reports still having incisional chest discomfort which is worse with coughing or deep breathing. She has been following sternal precautions. Denies any exertional pain resembling her prior angina. She denies any palpitations. Was aware of her atrial fibrillation during admission. Breathing has been at  baseline. No recent PND or lower extremity edema. She does not describe specific orthopnea but her chest hurts at night if she turns to her side and this can sometimes cause her to start coughing.   Past Medical History:  Diagnosis Date  . Arthritis   . CAD (coronary artery disease)    a. s/p CABG on 04/27/2020 with LIMA-LAD, seq SVG-PDA-PL, seq left radial-RI-OM  . Concussion    2015  . Depression   . Diabetes ([place])    type 2  . Dysphagia   . Dysrhythmia    afib  . GERD (gastroesophageal reflux disease)   . Headache   . History of bronchitis   . Hypertension   . Seasonal allergies   . Toenail fungus     Past Surgical History:  Procedure Laterality Date  . APPENDECTOMY    . BACK SURGERY     x2  . BOTOX INJECTION N/A 06/06/2016   Procedure: BOTOX INJECTION;  Surgeon: Rogene Houston, MD;  [facility];  Service: Endoscopy;  Laterality: N/A;  . CHOLECYSTECTOMY    . COLONOSCOPY N/A 11/05/2012   Procedure: COLONOSCOPY;  Surgeon: Rogene Houston, MD;  [facility];  Service: Endoscopy;  Laterality: N/A;  1030  . COLONOSCOPY WITH PROPOFOL N/A 05/01/2019   Procedure: COLONOSCOPY WITH PROPOFOL;  Surgeon: Rogene Houston, MD;  [facility];  Service: Endoscopy;  Laterality: N/A;  11:20am  . CORONARY ARTERY BYPASS GRAFT N/A 04/27/2020   Procedure: CORONARY ARTERY BYPASS GRAFTING (CABG) TIMES FIVE USING  LEFT INTERNAL MAMMARY ARTERY, LEFT HARVESTED RADIAL ARTERY, RIGHT GREATER SAPHENOUS VEIN HARVESTED ENDOSCOPICALLY.;  Surgeon: [name], MD;  Location: [place];  Service: Open Heart Surgery;  Laterality: N/A;  . ESOPHAGEAL DILATION N/A 07/06/2015   Procedure: ESOPHAGEAL DILATION;  Surgeon: Rogene Houston, MD;  [facility];  Service: Endoscopy;  Laterality: N/A;  . ESOPHAGEAL DILATION N/A 06/06/2016   Procedure: ESOPHAGEAL DILATION;  Surgeon: Rogene Houston, MD;  [facility];  Service: Endoscopy;  Laterality: N/A;  .  ESOPHAGOGASTRODUODENOSCOPY N/A 02/12/2014   Procedure: ESOPHAGOGASTRODUODENOSCOPY (EGD);  Surgeon: Rogene Houston, MD;  [facility];  Service: Endoscopy;  Laterality: N/A;  150  . ESOPHAGOGASTRODUODENOSCOPY N/A 07/06/2015   Procedure: ESOPHAGOGASTRODUODENOSCOPY (EGD);  Surgeon: Rogene Houston, MD;  [facility];  Service: Endoscopy;  Laterality: N/A;  1:25 - moved to 1/18 @ 10:30 - Ann to notify pt  . ESOPHAGOGASTRODUODENOSCOPY (EGD) WITH ESOPHAGEAL DILATION N/A 07/25/2012   Procedure: ESOPHAGOGASTRODUODENOSCOPY (EGD) WITH ESOPHAGEAL DILATION;  Surgeon: Rogene Houston, MD;  [facility];  Service: Endoscopy;  Laterality: N/A;  325-rescheduled to [place] notified pt  . ESOPHAGOGASTRODUODENOSCOPY (EGD) WITH PROPOFOL N/A 06/06/2016   Procedure: ESOPHAGOGASTRODUODENOSCOPY (EGD) WITH PROPOFOL;  Surgeon: Rogene Houston, MD;  [facility];  Service: Endoscopy;  Laterality: N/A;  . EYE SURGERY     cataracts removed  . Foot surgeries Bilateral    hammer toes  . LEFT HEART CATH AND CORONARY ANGIOGRAPHY N/A 04/26/2020   Procedure: LEFT HEART CATH AND CORONARY ANGIOGRAPHY;  Surgeon: Martinique, Peter M, MD;  Location: [place] [facility];  Service: Cardiovascular;  Laterality: N/A;  . Venia Minks DILATION N/A 02/12/2014   Procedure: Venia Minks DILATION;  Surgeon: Rogene Houston, MD;  [facility];  Service: Endoscopy;  Laterality: N/A;  . POLYPECTOMY  05/01/2019   Procedure: POLYPECTOMY;  Surgeon: Rogene Houston, MD;  [facility];  Service: Endoscopy;;  colon   . RADIAL ARTERY HARVEST Left 04/27/2020   Procedure: LEFT RADIAL ARTERY HARVEST;  Surgeon: [name], MD;  Location: [place];  Service: Open Heart Surgery;  Laterality: Left;  . Right knee arthroscopy     x2  . TEE WITHOUT CARDIOVERSION N/A 04/27/2020   Procedure: TRANSESOPHAGEAL ECHOCARDIOGRAM (TEE);  Surgeon: [name], MD;  Location: [place];  Service: Open Heart Surgery;   Laterality: N/A;  . TONSILLECTOMY    . TOTAL ABDOMINAL HYSTERECTOMY    . TOTAL KNEE ARTHROPLASTY Right 04/03/2017   Procedure: RIGHT TOTAL KNEE ARTHROPLASTY;  Surgeon: Latanya Maudlin, MD;  Location: [facility];  Service: Orthopedics;  Laterality: Right;    Current Medications: Outpatient Medications Prior to Visit  Medication Sig Dispense Refill  . acetaminophen (TYLENOL) 325 MG tablet Take 2 tablets (650 mg total) by mouth every 6 (six) hours as needed for mild pain.    Marland Kitchen ALPRAZolam (XANAX) 0.5 MG tablet Take 0.25 mg by mouth at bedtime as needed for anxiety.     Marland Kitchen aspirin EC 81 MG EC tablet Take 1 tablet (81 mg total) by mouth daily. Swallow whole. 30 tablet 11  . cetirizine (ZYRTEC) 10 MG tablet Take 10 mg by mouth daily.    Marland Kitchen glimepiride (AMARYL) 2 MG tablet Take 2 mg by mouth daily with breakfast.     . isosorbide dinitrate (ISORDIL) 5 MG tablet Take 1 tablet (5 mg total) by mouth 3 (three) times daily. 90 tablet 0  . metoprolol (  LOPRESSOR) 50 MG tablet Take 50 mg by mouth 2 (two) times daily.     Marland Kitchen oxybutynin (DITROPAN-XL) 10 MG 24 hr tablet Take 10 mg by mouth daily.    . pantoprazole (PROTONIX) 40 MG tablet Take 40 mg by mouth daily.    . rosuvastatin (CRESTOR) 40 MG tablet Take 1 tablet (40 mg total) by mouth daily. 30 tablet 1  . Zinc Oxide (TRIPLE PASTE) 12.8 % ointment Apply topically as needed for irritation. 56.7 g 0  . amiodarone (PACERONE) 200 MG tablet Take 1 tablet (200 mg total) by mouth daily. 30 tablet 1  . clopidogrel (PLAVIX) 75 MG tablet Take 1 tablet (75 mg total) by mouth daily. 30 tablet 1  . naproxen (NAPROSYN) 500 MG tablet Take 1 tablet (500 mg total) by mouth 2 (two) times daily with a meal. 10 tablet 0  . valACYclovir (VALTREX) 1000 MG tablet Take 1,000 mg by mouth 3 (three) times daily as needed.     No facility-administered medications prior to visit.     Allergies:   Levofloxacin, Cardizem [diltiazem], Sulfa antibiotics, Diazepam, and Lipitor  [atorvastatin]   Social History   Socioeconomic History  . Marital status: Divorced    Spouse name: Not on file  . Number of children: Not on file  . Years of education: Not on file  . Highest education level: Not on file  Occupational History  . Not on file  Tobacco Use  . Smoking status: Never Smoker  . Smokeless tobacco: Never Used  Vaping Use  . Vaping Use: Never used  Substance and Sexual Activity  . Alcohol use: No    Alcohol/week: 0.0 standard drinks    Comment: socially   . Drug use: No  . Sexual activity: Not Currently    Partners: Male    Birth control/protection: None  Other Topics Concern  . Not on file  Social History Narrative  . Not on file   Social Determinants of Health   Financial Resource Strain: Not on file  Food Insecurity: Not on file  Transportation Needs: Not on file  Physical Activity: Not on file  Stress: Not on file  Social Connections: Not on file     Family History:  The patient's family history includes Diabetes in her mother and sister; Stroke in her father.   Review of Systems:   Please see the history of present illness.     General:  No chills, fever, night sweats or weight changes.  Cardiovascular:  No dyspnea on exertion, edema, orthopnea, palpitations, paroxysmal nocturnal dyspnea. Positive for sternal pain.  Dermatological: No rash, lesions/masses Respiratory: No cough, dyspnea Urologic: No hematuria, dysuria Abdominal:   No nausea, vomiting, diarrhea, bright red blood per rectum, melena, or hematemesis Neurologic:  No visual changes, wkns, changes in mental status. All other systems reviewed and are otherwise negative except as noted above.   Physical Exam:    VS:  BP 140/68   Pulse 62   Ht 5' 2.5" (1.588 m)   Wt 142 lb (64.4 kg)   SpO2 99%   BMI 25.56 kg/m    General: Well developed, well nourished,female appearing in no acute distress. Head: Normocephalic, atraumatic. Neck: No carotid bruits. JVD not elevated.   Lungs: Respirations regular and unlabored, without wheezes or rales.  Heart: Regular rate and rhythm. No S3 or S4.  No murmur, no rubs, or gallops appreciated. Sternal incision appears well healing without erythema or drainage.  Abdomen: Appears non-distended. No obvious abdominal masses.  Msk:  Strength and tone appear normal for age. No obvious joint deformities or effusions. Extremities: No clubbing or cyanosis. No lower extremity edema.  Distal pedal pulses are 2+ bilaterally. Neuro: Alert and oriented X 3. Moves all extremities spontaneously. No focal deficits noted. Psych:  Responds to questions appropriately with a normal affect. Skin: No rashes or lesions noted  Wt Readings from Last 3 Encounters:  07/15/20 142 lb (64.4 kg)  06/15/20 140 lb 12.8 oz (63.9 kg)  05/23/20 135 lb (61.2 kg)     Studies/Labs Reviewed:   EKG:  EKG is ordered today. The ekg ordered today demonstrates sinus bradycardia, HR 59 with no acute ST abnormalities.   Recent Labs: 04/25/2020: B Natriuretic Peptide 34.7; TSH 5.542 05/12/2020: Magnesium 2.2 05/14/2020: Hemoglobin 10.9; Platelets 531 05/16/2020: ALT 12 05/17/2020: BUN <5; Creatinine, Ser 0.50; Potassium 3.6; Sodium 139   Lipid Panel    Component Value Date/Time   CHOL 138 04/26/2020 0145   CHOL 217 (H) 03/31/2020 1208   TRIG 131 05/14/2020 0827   HDL 37 (L) 04/26/2020 0145   HDL 38 (L) 03/31/2020 1208   CHOLHDL 3.7 04/26/2020 0145   VLDL 19 04/26/2020 0145   LDLCALC 82 04/26/2020 0145   LDLCALC 144 (H) 03/31/2020 1208    Additional studies/ records that were reviewed today include:   Echocardiogram: 02/2020 IMPRESSIONS    1. Left ventricular ejection fraction, by estimation, is 55 to 60%. The  left ventricle has normal function. The left ventricle has no regional  wall motion abnormalities. There is mild left ventricular hypertrophy.  Left ventricular diastolic parameters  are indeterminate.  2. Right ventricular systolic  function is normal. The right ventricular  size is normal. Tricuspid regurgitation signal is inadequate for assessing  PA pressure.  3. The mitral valve is grossly normal. Mild mitral valve regurgitation.  4. The aortic valve is tricuspid. Aortic valve regurgitation is not  visualized.  5. The inferior vena cava is normal in size with <50% respiratory  variability, suggesting right atrial pressure of 8 mmHg.   Cardiac Catheterization: 04/2020  Ost LM lesion is 25% stenosed.  Prox LAD to Mid LAD lesion is 75% stenosed.  1st Diag lesion is 90% stenosed.  Ost Cx to Prox Cx lesion is 80% stenosed.  1st Mrg lesion is 95% stenosed.  RPDA lesion is 90% stenosed.  1st RPL lesion is 90% stenosed.  The left ventricular systolic function is normal.  LV end diastolic pressure is normal.  The left ventricular ejection fraction is 55-65% by visual estimate.   1. Severe 3 vessel obstructive CAD 2. Normal LV function 3. Normal LVEDP  Plan: would consider revascularization with CABG.    Assessment:    1. Coronary artery disease involving native coronary artery of native heart without angina pectoris   2. Chest pain, unspecified type   3. Medication management   4. Postoperative atrial fibrillation (HCC)   5. Mixed hyperlipidemia   6. Essential hypertension      Plan:   In order of problems listed above:  1. CAD/Chest Pain - She is s/p CABG on 04/27/2020 with LIMA-LAD, seq SVG-PDA-PL, seq left radial-RI-OM. She denies any anginal symptoms but continues to have sternal pain. Reassurance was provided that it can take several months for this to improve. Will obtain a CXR today to make sure no acute changes when compared to prior imaging in 05/2020. EKG was without acute ST changes today.  - Reviewed she could take Tylenol as needed but would  try to avoid PO NSAIDS given the use of DAPT with ASA and Plavix. Was previously mentioned during prior visits and by CT Surgery to try  topical Lidocaine and we reviewed this today but would not use regularly if no change in symptoms. Will check CBC and BMET today to reassess Hgb and electrolytes. Also add TSH given reported fatigue.  - Continue ASA, Plavix, Isordil, Lopressor and Crestor.   2. Post-operative Atrial Fibrillation - She is in NSR by examination and EKG today. Denies any recent palpitations at home. Will stop Amiodarone given she is now more than 8 weeks out from surgery.   3. HLD - LDL was at 82 in 04/2020. She does have upcoming routine labs with her PCP next month and will ask for a copy to be sent to our office to make sure LDL is at goal of less than 70. Remains on Crestor 40mg  daily. Previously intolerant to Lipitor.   4. HTN - BP is at 140/68 during today's visit. Will continue to follow and can further titrate Lopressor or add Losartan if BP remains above goal.    Medication Adjustments/Labs and Tests Ordered: Current medicines are reviewed at length with the patient today.  Concerns regarding medicines are outlined above.  Medication changes, Labs and Tests ordered today are listed in the Patient Instructions below. Patient Instructions   Can purchase Aspercreme/Lidocaine gel over-the-counter to apply to the chest to see if this helps.  Medication Instructions:  STOP Amiodarone 200 mg tablets STOP Naproxen 500 mg tablets PICK UP Nitroglycerin 0.4 mg tablets from the pharmacy  *If you need a refill on your cardiac medications before your next appointment, please call your pharmacy*   Lab Work: BMET, CBC, TSH If you have labs (blood work) drawn today and your tests are completely normal, you will receive your results only by: Marland Kitchen MyChart Message (if you have MyChart) OR . A paper copy in the mail If you have any lab test that is abnormal or we need to change your treatment, we will call you to review the results.   Testing/Procedures:  Chest X-Ray A chest X-ray is a test that uses radiation to  create pictures of the organs in your chest, including the lungs, the heart, and the ribs. Chest X-rays are used to look for many health conditions, including heart failure, pneumonia, tuberculosis, rib fractures, breathing disorders, and cancer. They may be used to diagnose chest pain, constant coughing, or trouble breathing. Tell a health care provider about:  Any allergies you have.  All medicines you are taking, including vitamins, herbs, eye drops, creams, and over-the-counter medicines.  Any surgeries you have had.  Any medical conditions you have.  Whether you are pregnant or may be pregnant. What are the risks? Getting a chest X-ray is a safe procedure. However, you will be exposed to a small amount of radiation. Being exposed to too much radiation over a lifetime can increase the risk of cancer. This risk is small, but it may occur if you have many X-rays throughout your life. What happens before the procedure?  You may be asked to remove glasses, jewelry, and any other metal objects.  You will be asked to undress from the waist up. You may be given a hospital gown to wear.  You may be asked to wear a protective lead apron to protect other parts of your body from radiation. What happens during the procedure?  You will be asked to stand still as each picture is  taken. This ensures that good pictures are taken.  You will be asked to take a deep breath and hold it for a few seconds.  The X-ray machine will create a picture of your chest using a tiny burst of radiation. This is painless.  More pictures may be taken from other angles. Typically, one picture will be taken while you face the X-ray camera, and another picture will be taken from the side while you stand. If you cannot stand, you may be asked to lie down. The procedure may vary among health care providers and hospitals.   What can I expect after procedure?  The X-ray will be reviewed by your health care provider or  an X-ray specialist (radiologist).  You may return to your normal, everyday life, including diet, activities, and medicines, unless your health care provider tells you not to do that.  It is up to you to get your test results. Ask your health care provider, or the department that is doing the procedure, when your results will be ready.  Your health care provider will tell you if you need more tests or a follow-up exam. Keep all follow-up visits. This is important. Summary  A chest X-ray is a safe, painless test that uses radiation to create pictures of the organs inside your chest, including the lungs, heart, and ribs.  You will need to undress from the waist up and remove jewelry and metal objects before the procedure.  You will be exposed to a small amount of radiation during the procedure.  The X-ray machine will take one or more pictures of your chest while you remain as still as possible.  Later, a health care provider or specialist will review the test results with you. This information is not intended to replace advice given to you by your health care provider. Make sure you discuss any questions you have with your health care provider. Document Revised: 09/04/2019 Document Reviewed: 09/04/2019 Elsevier Patient Education  2021 [place]: At [facility], you and your health needs are our priority.  As part of our continuing mission to provide you with exceptional heart care, we have created designated Provider Care Teams.  These Care Teams include your primary Cardiologist (physician) and Advanced Practice Providers (APPs -  Physician Assistants and Nurse Practitioners) who all work together to provide you with the care you need, when you need it.  We recommend signing up for the patient portal called "MyChart".  Sign up information is provided on this After Visit Summary.  MyChart is used to connect with patients for Virtual Visits (Telemedicine).  Patients are  able to view lab/test results, encounter notes, upcoming appointments, etc.  Non-urgent messages can be sent to your provider as well.   To learn more about what you can do with MyChart, go to [email or web address].    Your next appointment:   Keep follow up appointment with Dorris Carnes, MD        Signed, Erma Heritage, PA-C  07/16/2020 10:28 AM    [place]. [address] [place], [place] [address] Phone: [phone] Fax: [phone]

## 2020-07-15 NOTE — Patient Instructions (Addendum)
Can purchase Aspercreme/Lidocaine gel over-the-counter to apply to the chest to see if this helps.  Medication Instructions:  STOP Amiodarone 200 mg tablets STOP Naproxen 500 mg tablets PICK UP Nitroglycerin 0.4 mg tablets from the pharmacy  *If you need a refill on your cardiac medications before your next appointment, please call your pharmacy*   Lab Work: BMET, CBC, TSH If you have labs (blood work) drawn today and your tests are completely normal, you will receive your results only by: Marland Kitchen MyChart Message (if you have MyChart) OR . A paper copy in the mail If you have any lab test that is abnormal or we need to change your treatment, we will call you to review the results.   Testing/Procedures:  Chest X-Ray A chest X-ray is a test that uses radiation to create pictures of the organs in your chest, including the lungs, the heart, and the ribs. Chest X-rays are used to look for many health conditions, including heart failure, pneumonia, tuberculosis, rib fractures, breathing disorders, and cancer. They may be used to diagnose chest pain, constant coughing, or trouble breathing. Tell a health care provider about:  Any allergies you have.  All medicines you are taking, including vitamins, herbs, eye drops, creams, and over-the-counter medicines.  Any surgeries you have had.  Any medical conditions you have.  Whether you are pregnant or may be pregnant. What are the risks? Getting a chest X-ray is a safe procedure. However, you will be exposed to a small amount of radiation. Being exposed to too much radiation over a lifetime can increase the risk of cancer. This risk is small, but it may occur if you have many X-rays throughout your life. What happens before the procedure?  You may be asked to remove glasses, jewelry, and any other metal objects.  You will be asked to undress from the waist up. You may be given a hospital gown to wear.  You may be asked to wear a protective  lead apron to protect other parts of your body from radiation. What happens during the procedure?  You will be asked to stand still as each picture is taken. This ensures that good pictures are taken.  You will be asked to take a deep breath and hold it for a few seconds.  The X-ray machine will create a picture of your chest using a tiny burst of radiation. This is painless.  More pictures may be taken from other angles. Typically, one picture will be taken while you face the X-ray camera, and another picture will be taken from the side while you stand. If you cannot stand, you may be asked to lie down. The procedure may vary among health care providers and hospitals.   What can I expect after procedure?  The X-ray will be reviewed by your health care provider or an X-ray specialist (radiologist).  You may return to your normal, everyday life, including diet, activities, and medicines, unless your health care provider tells you not to do that.  It is up to you to get your test results. Ask your health care provider, or the department that is doing the procedure, when your results will be ready.  Your health care provider will tell you if you need more tests or a follow-up exam. Keep all follow-up visits. This is important. Summary  A chest X-ray is a safe, painless test that uses radiation to create pictures of the organs inside your chest, including the lungs, heart, and ribs.  You will  need to undress from the waist up and remove jewelry and metal objects before the procedure.  You will be exposed to a small amount of radiation during the procedure.  The X-ray machine will take one or more pictures of your chest while you remain as still as possible.  Later, a health care provider or specialist will review the test results with you. This information is not intended to replace advice given to you by your health care provider. Make sure you discuss any questions you have with your  health care provider. Document Revised: 09/04/2019 Document Reviewed: 09/04/2019 Elsevier Patient Education  2021 [place]: At [facility], you and your health needs are our priority.  As part of our continuing mission to provide you with exceptional heart care, we have created designated Provider Care Teams.  These Care Teams include your primary Cardiologist (physician) and Advanced Practice Providers (APPs -  Physician Assistants and Nurse Practitioners) who all work together to provide you with the care you need, when you need it.  We recommend signing up for the patient portal called "MyChart".  Sign up information is provided on this After Visit Summary.  MyChart is used to connect with patients for Virtual Visits (Telemedicine).  Patients are able to view lab/test results, encounter notes, upcoming appointments, etc.  Non-urgent messages can be sent to your provider as well.   To learn more about what you can do with MyChart, go to [email or web address].    Your next appointment:   Keep follow up appointment with Dorris Carnes, MD

## 2020-07-16 ENCOUNTER — Encounter: Payer: Self-pay | Admitting: Student

## 2020-07-18 DIAGNOSIS — I10 Essential (primary) hypertension: Secondary | ICD-10-CM | POA: Diagnosis not present

## 2020-07-18 DIAGNOSIS — E785 Hyperlipidemia, unspecified: Secondary | ICD-10-CM | POA: Diagnosis not present

## 2020-07-19 ENCOUNTER — Other Ambulatory Visit (HOSPITAL_COMMUNITY)
Admission: RE | Admit: 2020-07-19 | Discharge: 2020-07-19 | Disposition: A | Discharge status: Home / Self Care | Payer: Medicare Other | Source: Home / Self Care | Attending: Student | Admitting: Student

## 2020-07-19 ENCOUNTER — Ambulatory Visit (HOSPITAL_COMMUNITY)
Admission: RE | Admit: 2020-07-19 | Discharge: 2020-07-19 | Disposition: A | Discharge status: Home / Self Care | Payer: Medicare Other | Source: Ambulatory Visit | Attending: Student | Admitting: Student

## 2020-07-19 ENCOUNTER — Other Ambulatory Visit: Payer: Self-pay

## 2020-07-19 DIAGNOSIS — R079 Chest pain, unspecified: Secondary | ICD-10-CM

## 2020-07-19 DIAGNOSIS — J90 Pleural effusion, not elsewhere classified: Secondary | ICD-10-CM | POA: Insufficient documentation

## 2020-07-19 DIAGNOSIS — Z951 Presence of aortocoronary bypass graft: Secondary | ICD-10-CM | POA: Insufficient documentation

## 2020-07-19 DIAGNOSIS — Z79899 Other long term (current) drug therapy: Secondary | ICD-10-CM | POA: Insufficient documentation

## 2020-07-19 DIAGNOSIS — J9811 Atelectasis: Secondary | ICD-10-CM | POA: Insufficient documentation

## 2020-07-19 LAB — BASIC METABOLIC PANEL
Anion gap: 9 (ref 5–15)
BUN: 14 mg/dL (ref 8–23)
CO2: 26 mmol/L (ref 22–32)
Calcium: 9.5 mg/dL (ref 8.9–10.3)
Chloride: 103 mmol/L (ref 98–111)
Creatinine, Ser: 0.61 mg/dL (ref 0.44–1.00)
GFR, Estimated: >60 mL/min (ref >60)
Glucose, Bld: 188 mg/dL — ABNORMAL HIGH (ref 70–99)
Potassium: 3.8 mmol/L (ref 3.5–5.1)
Sodium: 138 mmol/L (ref 135–145)

## 2020-07-19 LAB — CBC
HCT: 38 % (ref 36.0–46.0)
Hemoglobin: 11.4 g/dL — ABNORMAL LOW (ref 12.0–15.0)
MCH: 25.9 pg — ABNORMAL LOW (ref 26.0–34.0)
MCHC: 30 g/dL (ref 30.0–36.0)
MCV: 86.4 fL (ref 80.0–100.0)
Platelets: 298 10*3/uL (ref 150–400)
RBC: 4.4 MIL/uL (ref 3.87–5.11)
RDW: 14.5 % (ref 11.5–15.5)
WBC: 6.7 10*3/uL (ref 4.0–10.5)
nRBC: 0 % (ref 0.0–0.2)

## 2020-07-19 LAB — TSH: TSH: 1.899 u[IU]/mL (ref 0.350–4.500)

## 2020-07-20 ENCOUNTER — Other Ambulatory Visit: Payer: Self-pay | Admitting: *Deleted

## 2020-07-20 MED ORDER — FUROSEMIDE 20 MG PO TABS: 20.0000 mg | ORAL_TABLET | Freq: Every day | ORAL | 0 refills | Status: DC

## 2020-07-20 NOTE — Progress Notes (Signed)
Order placed for Lasix 20 mg Daily for five days.

## 2020-07-21 ENCOUNTER — Ambulatory Visit: Payer: Medicare Other | Admitting: Orthotics

## 2020-07-28 DIAGNOSIS — Z299 Encounter for prophylactic measures, unspecified: Secondary | ICD-10-CM | POA: Diagnosis not present

## 2020-07-28 DIAGNOSIS — I739 Peripheral vascular disease, unspecified: Secondary | ICD-10-CM | POA: Diagnosis not present

## 2020-07-28 DIAGNOSIS — I10 Essential (primary) hypertension: Secondary | ICD-10-CM | POA: Diagnosis not present

## 2020-07-28 DIAGNOSIS — E1165 Type 2 diabetes mellitus with hyperglycemia: Secondary | ICD-10-CM | POA: Diagnosis not present

## 2020-07-28 DIAGNOSIS — I25119 Atherosclerotic heart disease of native coronary artery with unspecified angina pectoris: Secondary | ICD-10-CM | POA: Diagnosis not present

## 2020-08-01 ENCOUNTER — Ambulatory Visit (INDEPENDENT_AMBULATORY_CARE_PROVIDER_SITE_OTHER): Payer: Medicare Other | Admitting: Orthotics

## 2020-08-01 ENCOUNTER — Other Ambulatory Visit: Payer: Self-pay

## 2020-08-01 DIAGNOSIS — M2042 Other hammer toe(s) (acquired), left foot: Secondary | ICD-10-CM

## 2020-08-01 DIAGNOSIS — M7741 Metatarsalgia, right foot: Secondary | ICD-10-CM

## 2020-08-01 DIAGNOSIS — M2032 Hallux varus (acquired), left foot: Secondary | ICD-10-CM

## 2020-08-01 DIAGNOSIS — E1140 Type 2 diabetes mellitus with diabetic neuropathy, unspecified: Secondary | ICD-10-CM

## 2020-08-01 DIAGNOSIS — M7742 Metatarsalgia, left foot: Secondary | ICD-10-CM

## 2020-08-01 DIAGNOSIS — L84 Corns and callosities: Secondary | ICD-10-CM

## 2020-08-01 NOTE — Progress Notes (Signed)

## 2020-08-04 DIAGNOSIS — I10 Essential (primary) hypertension: Secondary | ICD-10-CM | POA: Diagnosis not present

## 2020-08-04 DIAGNOSIS — Z789 Other specified health status: Secondary | ICD-10-CM | POA: Diagnosis not present

## 2020-08-04 DIAGNOSIS — D6869 Other thrombophilia: Secondary | ICD-10-CM | POA: Diagnosis not present

## 2020-08-04 DIAGNOSIS — J069 Acute upper respiratory infection, unspecified: Secondary | ICD-10-CM | POA: Diagnosis not present

## 2020-08-04 DIAGNOSIS — Z299 Encounter for prophylactic measures, unspecified: Secondary | ICD-10-CM | POA: Diagnosis not present

## 2020-08-04 DIAGNOSIS — R0789 Other chest pain: Secondary | ICD-10-CM | POA: Diagnosis not present

## 2020-08-06 ENCOUNTER — Telehealth: Payer: Self-pay | Admitting: Physician Assistant

## 2020-08-06 NOTE — Telephone Encounter (Signed)
   Pt c/o chest pain. It has been worse since she got a URI. However, it is basically the same CP that she has been having since her CABG.  She has been coughing a great deal. She saw her PCP and was given ABX, steroids, and promethazine cough medicine.   She has had a Covid test, does not know the results.   Advised that she could take Tylenol or Robitussin DM, but do not take multisystem products.  If sx do not improve, f/u with PCP on Monday.  Rosaria Ferries, PA-C 08/06/2020 1:25 PM

## 2020-08-08 ENCOUNTER — Telehealth: Payer: Self-pay | Admitting: Internal Medicine

## 2020-08-08 NOTE — Progress Notes (Signed)
Cardiology Office Note    Date:  08/09/2020   ID:  Katrina Castro, DOB 07-18-1945, MRN [id number]   PCP:  Glenda Chroman, MD   [place]  Cardiologist:  Dorris Carnes, MD  Advanced Practice Provider:  No care team member to display Electrophysiologist:  None   :[id number]}   No chief complaint on file.   History of Present Illness:  Katrina Castro is a 75 y.o. female  with past medical history of CAD (s/p CABG on 04/27/2020 with LIMA-LAD, seq SVG-PDA-PL, seq left radial-RI-OM), HTN, HLD, and Type 2 DM.  Patient had postop atrial fibrillation, Pseudomonas UTI, intermittent diarrhea.  Hospitalized at [facility] 05/12/2020 with abdominal pain and MRI showed pancreatic inflammation but mass could not be ruled out, C. difficile colitis  Patient has complained of incisional chest discomfort at several follow-up visits including 07/15/2020.  EKG that day was without acute change and in normal sinus rhythm.  Amiodarone was stopped.  X-ray shows similar bibasilar atelectasis with decreased pleural effusions the labs were stable including TSH.  Patient was added onto my schedule today because of same recurrent chest pain since CABG.  She had a URI and was given antibiotics steroids and promethazine cough medicine.  Covid test was pending.  Patient comes in for f/u. Hurting in upper left back since her URI. Was coughing a lot but has improved. Able to walk her dog 2 blocks. Tender to touch in back and chest.     Past Medical History:  Diagnosis Date  . Arthritis   . CAD (coronary artery disease)    a. s/p CABG on 04/27/2020 with LIMA-LAD, seq SVG-PDA-PL, seq left radial-RI-OM  . Concussion    2015  . Depression   . Diabetes ([place])    type 2  . Dysphagia   . Dysrhythmia    afib  . GERD (gastroesophageal reflux disease)   . Headache   . History of bronchitis   . Hypertension   . Seasonal allergies   . Toenail fungus     Past Surgical History:  Procedure  Laterality Date  . APPENDECTOMY    . BACK SURGERY     x2  . BOTOX INJECTION N/A 06/06/2016   Procedure: BOTOX INJECTION;  Surgeon: Rogene Houston, MD;  [facility];  Service: Endoscopy;  Laterality: N/A;  . CHOLECYSTECTOMY    . COLONOSCOPY N/A 11/05/2012   Procedure: COLONOSCOPY;  Surgeon: Rogene Houston, MD;  [facility];  Service: Endoscopy;  Laterality: N/A;  1030  . COLONOSCOPY WITH PROPOFOL N/A 05/01/2019   Procedure: COLONOSCOPY WITH PROPOFOL;  Surgeon: Rogene Houston, MD;  [facility];  Service: Endoscopy;  Laterality: N/A;  11:20am  . CORONARY ARTERY BYPASS GRAFT N/A 04/27/2020   Procedure: CORONARY ARTERY BYPASS GRAFTING (CABG) TIMES FIVE USING LEFT INTERNAL MAMMARY ARTERY, LEFT HARVESTED RADIAL ARTERY, RIGHT GREATER SAPHENOUS VEIN HARVESTED ENDOSCOPICALLY.;  Surgeon: [name], MD;  Location: [place];  Service: Open Heart Surgery;  Laterality: N/A;  . ESOPHAGEAL DILATION N/A 07/06/2015   Procedure: ESOPHAGEAL DILATION;  Surgeon: Rogene Houston, MD;  [facility];  Service: Endoscopy;  Laterality: N/A;  . ESOPHAGEAL DILATION N/A 06/06/2016   Procedure: ESOPHAGEAL DILATION;  Surgeon: Rogene Houston, MD;  [facility];  Service: Endoscopy;  Laterality: N/A;  . ESOPHAGOGASTRODUODENOSCOPY N/A 02/12/2014   Procedure: ESOPHAGOGASTRODUODENOSCOPY (EGD);  Surgeon: Rogene Houston, MD;  [facility];  Service: Endoscopy;  Laterality: N/A;  150  . ESOPHAGOGASTRODUODENOSCOPY N/A 07/06/2015   Procedure: ESOPHAGOGASTRODUODENOSCOPY (EGD);  Surgeon: Rogene Houston, MD;  [facility];  Service: Endoscopy;  Laterality: N/A;  1:25 - moved to 1/18 @ 10:30 - Ann to notify pt  . ESOPHAGOGASTRODUODENOSCOPY (EGD) WITH ESOPHAGEAL DILATION N/A 07/25/2012   Procedure: ESOPHAGOGASTRODUODENOSCOPY (EGD) WITH ESOPHAGEAL DILATION;  Surgeon: Rogene Houston, MD;  [facility];  Service: Endoscopy;  Laterality: N/A;   325-rescheduled to [place] notified pt  . ESOPHAGOGASTRODUODENOSCOPY (EGD) WITH PROPOFOL N/A 06/06/2016   Procedure: ESOPHAGOGASTRODUODENOSCOPY (EGD) WITH PROPOFOL;  Surgeon: Rogene Houston, MD;  [facility];  Service: Endoscopy;  Laterality: N/A;  . EYE SURGERY     cataracts removed  . Foot surgeries Bilateral    hammer toes  . LEFT HEART CATH AND CORONARY ANGIOGRAPHY N/A 04/26/2020   Procedure: LEFT HEART CATH AND CORONARY ANGIOGRAPHY;  Surgeon: Martinique, Peter M, MD;  Location: [facility];  Service: Cardiovascular;  Laterality: N/A;  . Venia Minks DILATION N/A 02/12/2014   Procedure: Venia Minks DILATION;  Surgeon: Rogene Houston, MD;  [facility];  Service: Endoscopy;  Laterality: N/A;  . POLYPECTOMY  05/01/2019   Procedure: POLYPECTOMY;  Surgeon: Rogene Houston, MD;  [facility];  Service: Endoscopy;;  colon   . RADIAL ARTERY HARVEST Left 04/27/2020   Procedure: LEFT RADIAL ARTERY HARVEST;  Surgeon: [name], MD;  Location: [place];  Service: Open Heart Surgery;  Laterality: Left;  . Right knee arthroscopy     x2  . TEE WITHOUT CARDIOVERSION N/A 04/27/2020   Procedure: TRANSESOPHAGEAL ECHOCARDIOGRAM (TEE);  Surgeon: [name], MD;  Location: [place];  Service: Open Heart Surgery;  Laterality: N/A;  . TONSILLECTOMY    . TOTAL ABDOMINAL HYSTERECTOMY    . TOTAL KNEE ARTHROPLASTY Right 04/03/2017   Procedure: RIGHT TOTAL KNEE ARTHROPLASTY;  Surgeon: Latanya Maudlin, MD;  Location: [facility];  Service: Orthopedics;  Laterality: Right;    Current Medications: Current Meds  Medication Sig  . acetaminophen (TYLENOL) 325 MG tablet Take 2 tablets (650 mg total) by mouth every 6 (six) hours as needed for mild pain.  Marland Kitchen albuterol (VENTOLIN HFA) 108 (90 Base) MCG/ACT inhaler   . ALPRAZolam (XANAX) 0.5 MG tablet Take 0.25 mg by mouth at bedtime as needed for anxiety.   Marland Kitchen amoxicillin-clavulanate (AUGMENTIN) 500-125 MG tablet Take 1 tablet by mouth 2  (two) times daily.  Marland Kitchen aspirin EC 81 MG EC tablet Take 1 tablet (81 mg total) by mouth daily. Swallow whole.  . clopidogrel (PLAVIX) 75 MG tablet Take 1 tablet (75 mg total) by mouth daily.  Marland Kitchen glipiZIDE (GLUCOTROL XL) 5 MG 24 hr tablet Take 5 mg by mouth 2 (two) times daily.  . metoprolol (LOPRESSOR) 50 MG tablet Take 50 mg by mouth 2 (two) times daily.   . nitroGLYCERIN (NITROSTAT) 0.4 MG SL tablet Place 1 tablet (0.4 mg total) under the tongue every 5 (five) minutes as needed for chest pain.  Marland Kitchen oxybutynin (DITROPAN-XL) 10 MG 24 hr tablet Take 10 mg by mouth daily.  . pantoprazole (PROTONIX) 40 MG tablet Take 40 mg by mouth daily.  . predniSONE (DELTASONE) 10 MG tablet Take 10 mg by mouth 2 (two) times daily.  . Zinc Oxide (TRIPLE PASTE) 12.8 % ointment Apply topically as needed for irritation.     Allergies:   Levofloxacin, Cardizem [diltiazem], Sulfa antibiotics, Diazepam, and Lipitor [atorvastatin]  Social History   Socioeconomic History  . Marital status: Divorced    Spouse name: Not on file  . Number of children: Not on file  . Years of education: Not on file  . Highest education level: Not on file  Occupational History  . Not on file  Tobacco Use  . Smoking status: Never Smoker  . Smokeless tobacco: Never Used  Vaping Use  . Vaping Use: Never used  Substance and Sexual Activity  . Alcohol use: No    Alcohol/week: 0.0 standard drinks    Comment: socially   . Drug use: No  . Sexual activity: Not Currently    Partners: Male    Birth control/protection: None  Other Topics Concern  . Not on file  Social History Narrative  . Not on file   Social Determinants of Health   Financial Resource Strain: Not on file  Food Insecurity: Not on file  Transportation Needs: Not on file  Physical Activity: Not on file  Stress: Not on file  Social Connections: Not on file     Family History:  The patient's family history includes Diabetes in her mother and sister; Stroke in her  father.   ROS:   Please see the history of present illness.    ROS All other systems reviewed and are negative.   PHYSICAL EXAM:   VS:  BP (!) 118/56   Pulse 62   Ht 5\' 3"  (1.6 m)   Wt 141 lb (64 kg)   SpO2 98%   BMI 24.98 kg/m   Physical Exam  GEN: Well nourished, well developed, in no acute distress  Neck: no JVD, carotid bruits, or masses Cardiac:chest and back tender to touch, RRR; no murmurs, rubs, or gallops  Respiratory:  clear to auscultation bilaterally, normal work of breathing GI: soft, nontender, nondistended, + BS Ext: without cyanosis, clubbing, or edema, Good distal pulses bilaterally Neuro:  Alert and Oriented x 3 Psych: euthymic mood, full affect  Wt Readings from Last 3 Encounters:  08/09/20 141 lb (64 kg)  07/15/20 142 lb (64.4 kg)  06/15/20 140 lb 12.8 oz (63.9 kg)      Studies/Labs Reviewed:   EKG:  EKG is not ordered today.    Recent Labs: 04/25/2020: B Natriuretic Peptide 34.7 05/12/2020: Magnesium 2.2 05/16/2020: ALT 12 07/19/2020: BUN 14; Creatinine, Ser 0.61; Hemoglobin 11.4; Platelets 298; Potassium 3.8; Sodium 138; TSH 1.899   Lipid Panel    Component Value Date/Time   CHOL 138 04/26/2020 0145   CHOL 217 (H) 03/31/2020 1208   TRIG 131 05/14/2020 0827   HDL 37 (L) 04/26/2020 0145   HDL 38 (L) 03/31/2020 1208   CHOLHDL 3.7 04/26/2020 0145   VLDL 19 04/26/2020 0145   LDLCALC 82 04/26/2020 0145   LDLCALC 144 (H) 03/31/2020 1208    Additional studies/ records that were reviewed today include:  Echocardiogram: 02/2020 IMPRESSIONS     1. Left ventricular ejection fraction, by estimation, is 55 to 60%. The  left ventricle has normal function. The left ventricle has no regional  wall motion abnormalities. There is mild left ventricular hypertrophy.  Left ventricular diastolic parameters  are indeterminate.   2. Right ventricular systolic function is normal. The right ventricular  size is normal. Tricuspid regurgitation signal is  inadequate for assessing  PA pressure.   3. The mitral valve is grossly normal. Mild mitral valve regurgitation.   4. The aortic valve is tricuspid. Aortic valve regurgitation is not  visualized.   5.  The inferior vena cava is normal in size with <50% respiratory  variability, suggesting right atrial pressure of 8 mmHg.    Cardiac Catheterization: 04/2020  Ost LM lesion is 25% stenosed.  Prox LAD to Mid LAD lesion is 75% stenosed.  1st Diag lesion is 90% stenosed.  Ost Cx to Prox Cx lesion is 80% stenosed.  1st Mrg lesion is 95% stenosed.  RPDA lesion is 90% stenosed.  1st RPL lesion is 90% stenosed.  The left ventricular systolic function is normal.  LV end diastolic pressure is normal.  The left ventricular ejection fraction is 55-65% by visual estimate.   1. Severe 3 vessel obstructive CAD 2. Normal LV function 3. Normal LVEDP   Plan: would consider revascularization with CABG.       Risk Assessment/Calculations:       ASSESSMENT:    1. Other chest pain   2. Coronary artery disease involving coronary bypass graft of native heart without angina pectoris   3. Atrial fibrillation, unspecified type ([place])   4. Essential hypertension   5. Hyperlipidemia, unspecified hyperlipidemia type      PLAN:  In order of problems listed above:  Chest pain mostly incisional which she has had since her CABG.  Recent URI treated with antibiotics, steroids, promethazine, now with back and shoulder blade pain tender to touch. M-S pain-treat with heating pad and OTC Tiger balm if needed.  CAD status post CABG 04/27/2020 EKG last office visit unchanged.  Echo normal LVEF-no angina  Postop atrial fibrillation was in normal sinus rhythm 07/15/2020 and amiodarone stopped, HR regular  Hypertension controlled  HLD on Crestor 40 mg daily previously intolerant to Lipitor.  Shared Decision Making/Informed Consent        Medication Adjustments/Labs and Tests Ordered: Current  medicines are reviewed at length with the patient today.  Concerns regarding medicines are outlined above.  Medication changes, Labs and Tests ordered today are listed in the Patient Instructions below. Patient Instructions  Medication Instructions:  Your physician recommends that you continue on your current medications as directed. Please refer to the Current Medication list given to you today.  *If you need a refill on your cardiac medications before your next appointment, please call your pharmacy*   Lab Work: NONE   If you have labs (blood work) drawn today and your tests are completely normal, you will receive your results only by: Marland Kitchen MyChart Message (if you have MyChart) OR . A paper copy in the mail If you have any lab test that is abnormal or we need to change your treatment, we will call you to review the results.   Testing/Procedures: NONE   Follow-Up: At [facility], you and your health needs are our priority.  As part of our continuing mission to provide you with exceptional heart care, we have created designated Provider Care Teams.  These Care Teams include your primary Cardiologist (physician) and Advanced Practice Providers (APPs -  Physician Assistants and Nurse Practitioners) who all work together to provide you with the care you need, when you need it.  We recommend signing up for the patient portal called "MyChart".  Sign up information is provided on this After Visit Summary.  MyChart is used to connect with patients for Virtual Visits (Telemedicine).  Patients are able to view lab/test results, encounter notes, upcoming appointments, etc.  Non-urgent messages can be sent to your provider as well.   To learn more about what you can do with MyChart, go to [email or web address].  Your next appointment:    As Planned   The format for your next appointment:   In Person  Provider:   Dorris Carnes, MD   Other Instructions Thank you for choosing [facility]!       Sumner Boast, PA-C  08/09/2020 11:18 AM    [facility] [place], [place], [place]  [address] Phone: [phone]; Fax: [phone]

## 2020-08-08 NOTE — Telephone Encounter (Signed)
Apt made for tomorrow 08/09/20 at 11 am

## 2020-08-08 NOTE — Telephone Encounter (Signed)
New message     Patient is on medication for congestion, she was put on 2x a day Amox-Clay 500 mg and 2 prednisone, and she had a inhaler and taking Robotussin DM , but she is now having tightness in chest and pains in back, she is having some concerns .  Please call her

## 2020-08-08 NOTE — Telephone Encounter (Signed)
Patient is complaining of SOB, back pain, numbness in her left arm, along with tightness in her chest.

## 2020-08-09 ENCOUNTER — Other Ambulatory Visit: Payer: Self-pay

## 2020-08-09 ENCOUNTER — Ambulatory Visit (INDEPENDENT_AMBULATORY_CARE_PROVIDER_SITE_OTHER): Payer: Medicare Other | Admitting: Physician Assistant

## 2020-08-09 ENCOUNTER — Encounter: Payer: Self-pay | Admitting: Physician Assistant

## 2020-08-09 ENCOUNTER — Telehealth: Payer: Self-pay | Admitting: Physician Assistant

## 2020-08-09 VITALS — BP 118/56 | HR 62 | Ht 63.0 in | Wt 141.0 lb

## 2020-08-09 DIAGNOSIS — I4891 Unspecified atrial fibrillation: Secondary | ICD-10-CM | POA: Diagnosis not present

## 2020-08-09 DIAGNOSIS — I25810 Atherosclerosis of coronary artery bypass graft(s) without angina pectoris: Secondary | ICD-10-CM

## 2020-08-09 DIAGNOSIS — E785 Hyperlipidemia, unspecified: Secondary | ICD-10-CM | POA: Diagnosis not present

## 2020-08-09 DIAGNOSIS — R0789 Other chest pain: Secondary | ICD-10-CM

## 2020-08-09 DIAGNOSIS — I10 Essential (primary) hypertension: Secondary | ICD-10-CM | POA: Diagnosis not present

## 2020-08-09 NOTE — Telephone Encounter (Signed)
Patient called back after seeing Ermalinda Barrios today. She forgot to ask her what she should do about her constipation she is having. She is eating lots of fiber and a using stool softerner. She has even tried Dulcolax laxative.She would like to know what she should do. She thinks her medications may be causing this.

## 2020-08-09 NOTE — Telephone Encounter (Signed)
Patient should contact her PCP concerning constipation. She didn't bring her meds in with her so not sure she is taking anything that would cause constipation. Her heart meds are not causing this. It could possibly be due to steroids but she needs to complete these. Please f/u with PCP. thanks

## 2020-08-09 NOTE — Telephone Encounter (Signed)
Patient notified and verbalized understanding. 

## 2020-08-09 NOTE — Telephone Encounter (Signed)
Contacted patient. Left a vm for call back.

## 2020-08-09 NOTE — Patient Instructions (Signed)
Medication Instructions:  Your physician recommends that you continue on your current medications as directed. Please refer to the Current Medication list given to you today.  *If you need a refill on your cardiac medications before your next appointment, please call your pharmacy*   Lab Work: NONE   If you have labs (blood work) drawn today and your tests are completely normal, you will receive your results only by:  [facility] (if you have MyChart) OR  A paper copy in the mail If you have any lab test that is abnormal or we need to change your treatment, we will call you to review the results.   Testing/Procedures: NONE   Follow-Up: At [facility], you and your health needs are our priority.  As part of our continuing mission to provide you with exceptional heart care, we have created designated Provider Care Teams.  These Care Teams include your primary Cardiologist (physician) and Advanced Practice Providers (APPs -  Physician Assistants and Nurse Practitioners) who all work together to provide you with the care you need, when you need it.  We recommend signing up for the patient portal called "MyChart".  Sign up information is provided on this After Visit Summary.  MyChart is used to connect with patients for Virtual Visits (Telemedicine).  Patients are able to view lab/test results, encounter notes, upcoming appointments, etc.  Non-urgent messages can be sent to your provider as well.   To learn more about what you can do with MyChart, go to [email or web address].    Your next appointment:    As Planned   The format for your next appointment:   In Person  Provider:   Dorris Carnes, MD   Other Instructions Thank you for choosing [facility]!

## 2020-08-10 DIAGNOSIS — E119 Type 2 diabetes mellitus without complications: Secondary | ICD-10-CM | POA: Diagnosis not present

## 2020-08-12 ENCOUNTER — Ambulatory Visit: Payer: Medicare Other

## 2020-08-12 ENCOUNTER — Other Ambulatory Visit: Payer: Self-pay

## 2020-08-12 DIAGNOSIS — M2032 Hallux varus (acquired), left foot: Secondary | ICD-10-CM

## 2020-08-16 NOTE — Progress Notes (Signed)
Pt came to pick up diabetic shoes and  didn't like the style of shoes. We will return the current shoes and reorder [id number] Size 7 Med. Pt will be contacted when shoes are ready for pick up.

## 2020-08-19 ENCOUNTER — Telehealth: Payer: Self-pay | Admitting: Podiatry

## 2020-08-19 NOTE — Telephone Encounter (Signed)
Called pt because the shoe she ordered is not available currently and I discussed with pt that if it was ok to order a new shoe that is soft on the top and navy blue and white for her to try or she could come in an pick out another shoe. We went with ordering the new shoe and I will call pt when it comes in and if she does not like it we will pick another shoe and order it before she leaves to make sure they have it in stock.

## 2020-08-30 NOTE — Progress Notes (Signed)
Cardiology Office Note   Date:  08/31/2020   ID:  Katrina Castro, DOB 15-Sep-1945, MRN [id number]  PCP:  Katrina Chroman, MD  Cardiologist:   Katrina Carnes, MD    F/U of CAD    History of Present Illness: Katrina Castro is a 75 y.o. female with a history of CAD (s/p CAVG in Nov 2021 (LIMA to LAD; SVG to PDA, PLSA; L radial to RI, OM1), HTN, HL and Type II DM   Post CABG had post op afib, UTI, diarrhea.  Hosp in Nov 2021 with C diff colitis and ? Pancreatitis   The pt was seen in Jan by B Strader   COmplained of some chest wall pain   Amiodarone stopped   She was last seen in cardiology clinic in Feb 2022 for CP   Had URI  COughing   The pt has been doing pretty good   She is active   Breathing is Ok   She has some chest wall pain but otherwise denies CP     She also has some chest pains with food   Hx of esophageal dysmotility   Has had botox injection in past   Due to see Dr Katrina Castro in April       Current Meds  Medication Sig  . acetaminophen (TYLENOL) 325 MG tablet Take 2 tablets (650 mg total) by mouth every 6 (six) hours as needed for mild pain.  Marland Kitchen albuterol (VENTOLIN HFA) 108 (90 Base) MCG/ACT inhaler   . ALPRAZolam (XANAX) 0.5 MG tablet Take 0.25 mg by mouth at bedtime as needed for anxiety.   Marland Kitchen aspirin EC 81 MG EC tablet Take 1 tablet (81 mg total) by mouth daily. Swallow whole.  . cetirizine (ZYRTEC) 10 MG tablet Take 10 mg by mouth daily.  . clopidogrel (PLAVIX) 75 MG tablet Take 1 tablet (75 mg total) by mouth daily.  Marland Kitchen glipiZIDE (GLUCOTROL XL) 5 MG 24 hr tablet Take 5 mg by mouth 2 (two) times daily.  . metoprolol (LOPRESSOR) 50 MG tablet Take 50 mg by mouth 2 (two) times daily.   . nitroGLYCERIN (NITROSTAT) 0.4 MG SL tablet Place 1 tablet (0.4 mg total) under the tongue every 5 (five) minutes as needed for chest pain.  Marland Kitchen oxybutynin (DITROPAN-XL) 10 MG 24 hr tablet Take 10 mg by mouth daily.  . pantoprazole (PROTONIX) 40 MG tablet Take 40 mg by mouth daily.  .  rosuvastatin (CRESTOR) 40 MG tablet Take 1 tablet (40 mg total) by mouth daily.  . Zinc Oxide (TRIPLE PASTE) 12.8 % ointment Apply topically as needed for irritation.     Allergies:   Levofloxacin, Cardizem [diltiazem], Sulfa antibiotics, Diazepam, and Lipitor [atorvastatin]   Past Medical History:  Diagnosis Date  . Arthritis   . CAD (coronary artery disease)    a. s/p CABG on 04/27/2020 with LIMA-LAD, seq SVG-PDA-PL, seq left radial-RI-OM  . Concussion    2015  . Depression   . Diabetes ([place])    type 2  . Dysphagia   . Dysrhythmia    afib  . GERD (gastroesophageal reflux disease)   . Headache   . History of bronchitis   . Hypertension   . Seasonal allergies   . Toenail fungus     Past Surgical History:  Procedure Laterality Date  . APPENDECTOMY    . BACK SURGERY     x2  . BOTOX INJECTION N/A 06/06/2016   Procedure: BOTOX INJECTION;  Surgeon: Rogene Houston,  MD;  [facility];  Service: Endoscopy;  Laterality: N/A;  . CHOLECYSTECTOMY    . COLONOSCOPY N/A 11/05/2012   Procedure: COLONOSCOPY;  Surgeon: Rogene Houston, MD;  [facility];  Service: Endoscopy;  Laterality: N/A;  1030  . COLONOSCOPY WITH PROPOFOL N/A 05/01/2019   Procedure: COLONOSCOPY WITH PROPOFOL;  Surgeon: Rogene Houston, MD;  [facility];  Service: Endoscopy;  Laterality: N/A;  11:20am  . CORONARY ARTERY BYPASS GRAFT N/A 04/27/2020   Procedure: CORONARY ARTERY BYPASS GRAFTING (CABG) TIMES FIVE USING LEFT INTERNAL MAMMARY ARTERY, LEFT HARVESTED RADIAL ARTERY, RIGHT GREATER SAPHENOUS VEIN HARVESTED ENDOSCOPICALLY.;  Surgeon: Katrina Olds, MD;  Location: [facility];  Service: Open Heart Surgery;  Laterality: N/A;  . ESOPHAGEAL DILATION N/A 07/06/2015   Procedure: ESOPHAGEAL DILATION;  Surgeon: Rogene Houston, MD;  [facility];  Service: Endoscopy;  Laterality: N/A;  . ESOPHAGEAL DILATION N/A 06/06/2016   Procedure: ESOPHAGEAL DILATION;  Surgeon: Rogene Houston, MD;  [facility];  Service: Endoscopy;  Laterality: N/A;  . ESOPHAGOGASTRODUODENOSCOPY N/A 02/12/2014   Procedure: ESOPHAGOGASTRODUODENOSCOPY (EGD);  Surgeon: Rogene Houston, MD;  [facility];  Service: Endoscopy;  Laterality: N/A;  150  . ESOPHAGOGASTRODUODENOSCOPY N/A 07/06/2015   Procedure: ESOPHAGOGASTRODUODENOSCOPY (EGD);  Surgeon: Rogene Houston, MD;  [facility];  Service: Endoscopy;  Laterality: N/A;  1:25 - moved to 1/18 @ 10:30 - Ann to notify pt  . ESOPHAGOGASTRODUODENOSCOPY (EGD) WITH ESOPHAGEAL DILATION N/A 07/25/2012   Procedure: ESOPHAGOGASTRODUODENOSCOPY (EGD) WITH ESOPHAGEAL DILATION;  Surgeon: Rogene Houston, MD;  [facility];  Service: Endoscopy;  Laterality: N/A;  325-rescheduled to [place] notified pt  . ESOPHAGOGASTRODUODENOSCOPY (EGD) WITH PROPOFOL N/A 06/06/2016   Procedure: ESOPHAGOGASTRODUODENOSCOPY (EGD) WITH PROPOFOL;  Surgeon: Rogene Houston, MD;  [facility];  Service: Endoscopy;  Laterality: N/A;  . EYE SURGERY     cataracts removed  . Foot surgeries Bilateral    hammer toes  . LEFT HEART CATH AND CORONARY ANGIOGRAPHY N/A 04/26/2020   Procedure: LEFT HEART CATH AND CORONARY ANGIOGRAPHY;  Surgeon: Castro, Katrina M, MD;  Location: [facility];  Service: Cardiovascular;  Laterality: N/A;  . Venia Minks DILATION N/A 02/12/2014   Procedure: Venia Minks DILATION;  Surgeon: Rogene Houston, MD;  [facility];  Service: Endoscopy;  Laterality: N/A;  . POLYPECTOMY  05/01/2019   Procedure: POLYPECTOMY;  Surgeon: Rogene Houston, MD;  [facility];  Service: Endoscopy;;  colon   . RADIAL ARTERY HARVEST Left 04/27/2020   Procedure: LEFT RADIAL ARTERY HARVEST;  Surgeon: Katrina Olds, MD;  Location: [facility];  Service: Open Heart Surgery;  Laterality: Left;  . Right knee arthroscopy     x2  . TEE WITHOUT CARDIOVERSION N/A 04/27/2020   Procedure: TRANSESOPHAGEAL ECHOCARDIOGRAM (TEE);   Surgeon: Katrina Olds, MD;  Location: [facility];  Service: Open Heart Surgery;  Laterality: N/A;  . TONSILLECTOMY    . TOTAL ABDOMINAL HYSTERECTOMY    . TOTAL KNEE ARTHROPLASTY Right 04/03/2017   Procedure: RIGHT TOTAL KNEE ARTHROPLASTY;  Surgeon: Latanya Maudlin, MD;  Location: [facility];  Service: Orthopedics;  Laterality: Right;     Social History:  The patient  reports that she has never smoked. She has never used smokeless tobacco. She reports that she does not drink alcohol and does not use drugs.   Family History:  The patient's family history includes Diabetes in  her mother and sister; Stroke in her father.    ROS:  Please see the history of present illness. All other systems are reviewed and  Negative to the above problem except as noted.    PHYSICAL EXAM: VS:  BP 122/64   Pulse 66   Ht 5\' 3"  (1.6 m)   Wt 144 lb 12.8 oz (65.7 kg)   SpO2 97%   BMI 25.65 kg/m   GEN: Well nourished, well developed, in no acute distress  HEENT: normal  Neck: no JVD, carotid bruits Cardiac: RRR; no murmurs No LE edema  Respiratory:  clear to auscultation bilaterally,  GI: soft, nontender, nondistended, + BS  No hepatomegaly  MS: no deformity Moving all extremities   Skin: warm and dry, no rash Neuro:  Strength and sensation are intact Psych: euthymic mood, full affect   EKG:  EKG is not ordered today.   Lipid Panel    Component Value Date/Time   CHOL 138 04/26/2020 0145   CHOL 217 (H) 03/31/2020 1208   TRIG 131 05/14/2020 0827   HDL 37 (L) 04/26/2020 0145   HDL 38 (L) 03/31/2020 1208   CHOLHDL 3.7 04/26/2020 0145   VLDL 19 04/26/2020 0145   LDLCALC 82 04/26/2020 0145   LDLCALC 144 (H) 03/31/2020 1208      Wt Readings from Last 3 Encounters:  08/31/20 144 lb 12.8 oz (65.7 kg)  08/09/20 141 lb (64 kg)  07/15/20 142 lb (64.4 kg)      ASSESSMENT AND PLAN:  1  CAD  Pt is s/p CABG in Nov 2021  Doing well   Volume looks good   Has minimal chest wall pain I recomm Capsaicin  cream.     Keep on ASA and plavix as she presented with unstable angina   I have also recomm that the pt start cardiac rehab  2  HTN  BP is controlled  3   HL   Will get lipids today  4  GI   Esophageal problems   Has appt with Rehman in April  It is OK to hold plavix if procedure is planned         Plan for f/u in Aug 2022     Current medicines are reviewed at length with the patient today.  The patient does not have concerns regarding medicines.  Signed, Katrina Carnes, MD  08/31/2020 1:24 PM    [facility] [place], [place], [place]  [address] Phone: [phone]; Fax: [phone]

## 2020-08-31 ENCOUNTER — Other Ambulatory Visit (HOSPITAL_COMMUNITY)
Admission: RE | Admit: 2020-08-31 | Discharge: 2020-08-31 | Disposition: A | Discharge status: Home / Self Care | Payer: Medicare Other | Source: Ambulatory Visit | Attending: Internal Medicine | Admitting: Internal Medicine

## 2020-08-31 ENCOUNTER — Encounter: Payer: Self-pay | Admitting: Internal Medicine

## 2020-08-31 ENCOUNTER — Other Ambulatory Visit: Payer: Self-pay

## 2020-08-31 ENCOUNTER — Ambulatory Visit (INDEPENDENT_AMBULATORY_CARE_PROVIDER_SITE_OTHER): Payer: Medicare Other | Admitting: Internal Medicine

## 2020-08-31 VITALS — BP 122/64 | HR 66 | Ht 63.0 in | Wt 144.8 lb

## 2020-08-31 DIAGNOSIS — I25810 Atherosclerosis of coronary artery bypass graft(s) without angina pectoris: Secondary | ICD-10-CM | POA: Diagnosis not present

## 2020-08-31 DIAGNOSIS — E785 Hyperlipidemia, unspecified: Secondary | ICD-10-CM | POA: Insufficient documentation

## 2020-08-31 LAB — HEPATIC FUNCTION PANEL
ALT: 18 U/L (ref 0–44)
AST: 23 U/L (ref 15–41)
Albumin: 4.1 g/dL (ref 3.5–5.0)
Alkaline Phosphatase: 85 U/L (ref 38–126)
Bilirubin, Direct: <0.1 mg/dL (ref 0.0–0.2)
Total Bilirubin: 0.3 mg/dL (ref 0.3–1.2)
Total Protein: 7.7 g/dL (ref 6.5–8.1)

## 2020-08-31 LAB — BASIC METABOLIC PANEL
Anion gap: 10 (ref 5–15)
BUN: 12 mg/dL (ref 8–23)
CO2: 27 mmol/L (ref 22–32)
Calcium: 9.6 mg/dL (ref 8.9–10.3)
Chloride: 104 mmol/L (ref 98–111)
Creatinine, Ser: 0.61 mg/dL (ref 0.44–1.00)
GFR, Estimated: >60 mL/min (ref >60)
Glucose, Bld: 119 mg/dL — ABNORMAL HIGH (ref 70–99)
Potassium: 3.7 mmol/L (ref 3.5–5.1)
Sodium: 141 mmol/L (ref 135–145)

## 2020-08-31 LAB — CBC
HCT: 39.7 % (ref 36.0–46.0)
Hemoglobin: 11.9 g/dL — ABNORMAL LOW (ref 12.0–15.0)
MCH: 25 pg — ABNORMAL LOW (ref 26.0–34.0)
MCHC: 30 g/dL (ref 30.0–36.0)
MCV: 83.4 fL (ref 80.0–100.0)
Platelets: 330 10*3/uL (ref 150–400)
RBC: 4.76 MIL/uL (ref 3.87–5.11)
RDW: 14.6 % (ref 11.5–15.5)
WBC: 7.4 10*3/uL (ref 4.0–10.5)
nRBC: 0 % (ref 0.0–0.2)

## 2020-08-31 LAB — LIPID PANEL
Cholesterol: 160 mg/dL (ref 0–200)
HDL: 41 mg/dL (ref >40)
LDL Cholesterol: 88 mg/dL (ref 0–99)
Total CHOL/HDL Ratio: 3.9 RATIO
Triglycerides: 156 mg/dL — ABNORMAL HIGH (ref <150)
VLDL: 31 mg/dL (ref 0–40)

## 2020-08-31 NOTE — Patient Instructions (Addendum)
Medication Instructions:  Your physician recommends that you continue on your current medications as directed. Please refer to the Current Medication list given to you today.    Try Capsaicin cream on your chest incision   *If you need a refill on your cardiac medications before your next appointment, please call your pharmacy*   Lab Work:  CBC,BMET,Lipids   If you have labs (blood work) drawn today and your tests are completely normal, you will receive your results only by: Marland Kitchen MyChart Message (if you have MyChart) OR . A paper copy in the mail If you have any lab test that is abnormal or we need to change your treatment, we will call you to review the results.   Testing/Procedures: None today   Follow-Up: At [facility], you and your health needs are our priority.  As part of our continuing mission to provide you with exceptional heart care, we have created designated Provider Care Teams.  These Care Teams include your primary Cardiologist (physician) and Advanced Practice Providers (APPs -  Physician Assistants and Nurse Practitioners) who all work together to provide you with the care you need, when you need it.  We recommend signing up for the patient portal called "MyChart".  Sign up information is provided on this After Visit Summary.  MyChart is used to connect with patients for Virtual Visits (Telemedicine).  Patients are able to view lab/test results, encounter notes, upcoming appointments, etc.  Non-urgent messages can be sent to your provider as well.   To learn more about what you can do with MyChart, go to [email or web address].    Your next appointment:   5 month(s)  The format for your next appointment:   In Person  Provider:   Dorris Carnes, MD   Other Instructions We have referred you to Cardiac rehab, they will call you to make an appointment.       Thank you for choosing [facility] !

## 2020-09-01 ENCOUNTER — Telehealth: Payer: Self-pay | Admitting: *Deleted

## 2020-09-01 DIAGNOSIS — Z951 Presence of aortocoronary bypass graft: Secondary | ICD-10-CM

## 2020-09-01 MED ORDER — EZETIMIBE 10 MG PO TABS: 10.0000 mg | ORAL_TABLET | Freq: Every day | ORAL | 3 refills | Status: DC

## 2020-09-01 NOTE — Telephone Encounter (Signed)
-----   Message from Dorris Carnes V, MD sent at 08/31/2020  5:27 PM EDT ----- Electrolytes and kidney function are OK  Liver function si OK   CBC is OK   Lipids:  LDL 8s 88   Given recent cabg , would like tighter control    WOuld keep on Crestor  Add Zetia 10 mg   Follow up lipids in 8 wiks

## 2020-09-01 NOTE — Telephone Encounter (Signed)
Pt notified and orders placed  

## 2020-09-08 ENCOUNTER — Telehealth: Payer: Self-pay | Admitting: Podiatry

## 2020-09-08 NOTE — Telephone Encounter (Signed)
Pt called this today asking about her diabetic shoes that were reordered.   Upon checking they have not shipped and I have messaged and talked with Joneen Caraway @ [employer] to see what is the hold up. He has sent a message to shipping and to his supervisor to see what is going on.  I notified pt that I am waiting to hear back still.

## 2020-09-14 DIAGNOSIS — E785 Hyperlipidemia, unspecified: Secondary | ICD-10-CM | POA: Diagnosis not present

## 2020-09-14 DIAGNOSIS — I10 Essential (primary) hypertension: Secondary | ICD-10-CM | POA: Diagnosis not present

## 2020-09-16 ENCOUNTER — Telehealth: Payer: Self-pay | Admitting: Podiatry

## 2020-09-16 NOTE — Telephone Encounter (Signed)
Left message for pt that her shoes have came in and to call to schedule an appt to pick them up.

## 2020-09-20 ENCOUNTER — Telehealth: Payer: Self-pay | Admitting: Podiatry

## 2020-09-20 NOTE — Telephone Encounter (Signed)
Pt left message stating she needed to change her appt from 4.7 to next week the 11th or 12th.  I returned call and left message for pt that I had an appt on 4.11 @ 130 or she could pick up at her appt on 4.21 with Dr Sherryle Lis.  I then called back and spoke to pt and we r/s to 4.11 @ 130.

## 2020-09-22 ENCOUNTER — Other Ambulatory Visit: Payer: Medicare Other

## 2020-09-23 NOTE — Telephone Encounter (Signed)
Orders only

## 2020-09-26 ENCOUNTER — Other Ambulatory Visit: Payer: Medicare Other

## 2020-09-26 DIAGNOSIS — H6980 Other specified disorders of Eustachian tube, unspecified ear: Secondary | ICD-10-CM | POA: Diagnosis not present

## 2020-09-26 DIAGNOSIS — J329 Chronic sinusitis, unspecified: Secondary | ICD-10-CM | POA: Diagnosis not present

## 2020-09-26 DIAGNOSIS — Z299 Encounter for prophylactic measures, unspecified: Secondary | ICD-10-CM | POA: Diagnosis not present

## 2020-09-28 ENCOUNTER — Other Ambulatory Visit: Payer: Self-pay

## 2020-09-28 ENCOUNTER — Ambulatory Visit (INDEPENDENT_AMBULATORY_CARE_PROVIDER_SITE_OTHER): Payer: Medicare Other | Admitting: Podiatry

## 2020-09-28 DIAGNOSIS — M7742 Metatarsalgia, left foot: Secondary | ICD-10-CM

## 2020-09-28 DIAGNOSIS — E1140 Type 2 diabetes mellitus with diabetic neuropathy, unspecified: Secondary | ICD-10-CM

## 2020-09-28 DIAGNOSIS — M7741 Metatarsalgia, right foot: Secondary | ICD-10-CM

## 2020-09-28 NOTE — Progress Notes (Signed)
Patient presented to the office today to pick up diabetic shoes and 3 pair diabetic custom inserts.  1 pair of inserts were put in the shoes and the shoes were fitted to the patient. The patient states they are comfortable but the big toe is at the tip of the shoes and patient stated that the shoes were to be a 7 1/2 instead of a 7.  I stated to the patient that I would order a 7 1/2 today and hopefully we can get it next week and patient would be contacted when the shoes are ready for pick up.

## 2020-10-06 ENCOUNTER — Ambulatory Visit: Payer: Medicare Other | Admitting: Podiatry

## 2020-10-10 ENCOUNTER — Telehealth: Payer: Self-pay | Admitting: Internal Medicine

## 2020-10-10 ENCOUNTER — Other Ambulatory Visit: Payer: Self-pay

## 2020-10-10 ENCOUNTER — Encounter: Payer: Self-pay | Admitting: Nutrition

## 2020-10-10 ENCOUNTER — Encounter: Payer: Medicare Other | Attending: Internal Medicine | Admitting: Nutrition

## 2020-10-10 DIAGNOSIS — Z951 Presence of aortocoronary bypass graft: Secondary | ICD-10-CM | POA: Diagnosis not present

## 2020-10-10 DIAGNOSIS — E118 Type 2 diabetes mellitus with unspecified complications: Secondary | ICD-10-CM | POA: Insufficient documentation

## 2020-10-10 DIAGNOSIS — E782 Mixed hyperlipidemia: Secondary | ICD-10-CM | POA: Diagnosis not present

## 2020-10-10 NOTE — Telephone Encounter (Signed)
STAT if HR is under 50 or over 120 (normal HR is 60-100 beats per minute)  1) What is your heart rate? 56-57    2) Do you have a log of your heart rate readings (document readings)?  No her watch said that it was 56 right now its 57  3) Do you have any other symptoms? She had a few pains in her chest earlier , but they have gone away

## 2020-10-10 NOTE — Telephone Encounter (Signed)
Pt c/o chest pain, and nausea that has been on and off since last night. No increase SOB, or sweating. States that when pain come that it is 8/10. States that it maybe indigestion. Reports that she has an appt with Dr. Laural Golden on tomorrow. Pt states that she does not want to be seen in the ER at this time but understands that if her pain becomes worse she needs to be seen in the ER. Please advise.

## 2020-10-10 NOTE — Telephone Encounter (Signed)
Left a message for patient to call our office back.  

## 2020-10-10 NOTE — Telephone Encounter (Signed)
Katrina Castro the diabetes educator called stating the pt has been having a little bit of chest pain, her heart rate is around 55-60's.   Please call [phone]

## 2020-10-10 NOTE — Telephone Encounter (Signed)
Pt has CABG in 2021 She  has appt with Dr Dorien Chihuahua   Let's see what conclusion is  Please get records from this visit

## 2020-10-10 NOTE — Progress Notes (Signed)
Medical Nutrition Therapy:  Appt start time: 1100 end time:  1130 Assessment:  Primary concerns today: Diabetes Type 2 , CAD and Hyperlipidemia. She had CABG x 5 in November 2021.. DX 11.8 and E78.0  History of pancreatitis and possible pseudocyst on the tail of the pancreas per cardiology notes. Testing blood sugar occasionally. "I have been craving skinny girl popcorn. That's all I want to eat anymore. It's like I can't get enough of it."  Concerned about her  heart rate in the  55-57's. TC to her cardiologist office and they said a nurse will call her back. Been craving popcorn and salty foods. Doesn't cook much at home anymore. Eats out a lot. 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   Takes Glipizide 5 mg BID now instead of Glimepiride and is taking Metformin 500 mg BID. Hasn't been testing blood sugars regularly. Willing to start.  Starts cardiac rehab in May 2022.  PCP Dr. Woody Seller.  Lab Results  Component Value Date   HGBA1C 6.4 (H) 05/12/2020   CMP Latest Ref Rng & Units 08/31/2020 07/19/2020 05/17/2020  Glucose 70 - 99 mg/dL 119(H) 188(H) 128(H)  BUN 8 - 23 mg/dL 12 14 <5(L)  Creatinine 0.44 - 1.00 mg/dL 0.61 0.61 0.50  Sodium 135 - 145 mmol/L 141 138 139  Potassium 3.5 - 5.1 mmol/L 3.7 3.8 3.6  Chloride 98 - 111 mmol/L 104 103 102  CO2 22 - 32 mmol/L 27 26 28   Calcium 8.9 - 10.3 mg/dL 9.6 9.5 8.6(L)  Total Protein 6.5 - 8.1 g/dL 7.7 - -  Total Bilirubin 0.3 - 1.2 mg/dL 0.3 - -  Alkaline Phos 38 - 126 U/L 85 - -  AST 15 - 41 U/L 23 - -  ALT 0 - 44 U/L 18 - -   Lipid Panel     Component Value Date/Time   CHOL 160 08/31/2020 1339   CHOL 217 (H) 03/31/2020 1208   TRIG 156 (H) 08/31/2020 1339   HDL 41 08/31/2020 1339   HDL 38 (L) 03/31/2020 1208   CHOLHDL 3.9 08/31/2020 1339   VLDL 31 08/31/2020 1339   LDLCALC 88 08/31/2020 1339   LDLCALC 144 (H) 03/31/2020 1208   LABVLDL 35 03/31/2020 1208   CBC    Component Value Date/Time   WBC 7.4 08/31/2020 1339   RBC 4.76  08/31/2020 1339   HGB 11.9 (L) 08/31/2020 1339   HGB 12.3 03/31/2020 1208   HCT 39.7 08/31/2020 1339   HCT 38.2 03/31/2020 1208   PLT 330 08/31/2020 1339   PLT 190 03/31/2020 1208   MCV 83.4 08/31/2020 1339   MCV 80 03/31/2020 1208   MCH 25.0 (L) 08/31/2020 1339   MCHC 30.0 08/31/2020 1339   RDW 14.6 08/31/2020 1339   RDW 13.1 03/31/2020 1208   LYMPHSABS 3.9 03/25/2017 1146   MONOABS 1.0 03/25/2017 1146   EOSABS 0.1 03/25/2017 1146   BASOSABS 0.0 03/25/2017 1146     Wt Readings from Last 3 Encounters:  08/31/20 144 lb 12.8 oz (65.7 kg)  08/09/20 141 lb (64 kg)  07/15/20 142 lb (64.4 kg)   Ht Readings from Last 3 Encounters:  08/31/20 5\' 3"  (1.6 m)  08/09/20 5\' 3"  (1.6 m)  07/15/20 5' 2.5" (1.588 m)   There is no height or weight on file to calculate BMI. @BMIFA @ Facility age limit for growth percentiles is 20 years. Facility age limit for growth percentiles is 20 years.   Preferred Learning Style:   No  preference indicated   Learning Readiness:  Ready  Change in progress   MEDICATIONS:   DIETARY INTAKE:   24-hr recall:  B ( AM): 1 egg, 1 slice bacon and 1/2 slice toast. Snk ( AM): Popcorn L ( PM): skipped lunch : Ate Popcorn and coffee Dinner Chicken potpie from [facility]. Doesn't cook much and just goes and picks up something to eat when she feels like it.  Snacks: Popcorn  Snk ( PM):  Beverages: water and sweet tea  Usual physical activity: ADL  Estimated energy needs: 1600-1800  calories 200 g carbohydrates 125 g protein 50 g fat  Progress Towards Goal(s):  In progress.   Nutritional Diagnosis:  NI-5.6.2 Excessive fat intake As related to CAD.  As evidenced by CABG x 5.  Knowledge deficit related to Type 2 Dm as evidenced by A1C 6.5% and on Glimperide.    Intervention:  . Nutrition and Diabetes education provided on My Plate, CHO counting, meal planning, portion sizes, timing of meals, avoiding snacks between meals unless having a low blood  sugar, target ranges for A1C and blood sugars, signs/symptoms and treatment of hyper/hypoglycemia, monitoring blood sugars, taking medications as prescribed, benefits of exercising 30 minutes per day and prevention of complications of DM. Importance of hydration 64-84 oz.. Low Salt Low Cholesterol dietGoals  Eat 2 eggs, 1 slice bread and 1 piece of fruit Cut out popcorn Increase fresh fruits and vegetables., Drink 5 bottles of water per day. Talk to Cardiologist about where your heart rate should be. Start cardiac rehab in May Start walking when MD says ok.  Teaching Method Utilized:    Auditory- phone visit Handouts given during visit include:  MyPlate    Barriers to learning/adherence to lifestyle change: None  Demonstrated degree of understanding via:  Teach Bac Monitoring/Evaluation:  Dietary intake, exercise, blood sugars, and body weight in 1 month(s).

## 2020-10-10 NOTE — Patient Instructions (Addendum)
Goals  Eat 2 eggs, 1 slice bread and 1 piece of fruit Cut out popcorn Increase fresh fruits and vegetables., Drink 5 bottles of water per day. Talk to Cardiologist about where your heart rate should be. Start cardiac rehab in May Start walking when MD says ok.

## 2020-10-11 ENCOUNTER — Encounter (INDEPENDENT_AMBULATORY_CARE_PROVIDER_SITE_OTHER): Payer: Self-pay | Admitting: Internal Medicine

## 2020-10-11 ENCOUNTER — Ambulatory Visit: Payer: Medicare Other | Admitting: Podiatry

## 2020-10-11 ENCOUNTER — Ambulatory Visit (INDEPENDENT_AMBULATORY_CARE_PROVIDER_SITE_OTHER): Payer: Medicare Other | Admitting: Internal Medicine

## 2020-10-11 VITALS — BP 142/75 | HR 56 | Temp 97.9°F | Ht 63.0 in | Wt 145.3 lb

## 2020-10-11 DIAGNOSIS — K220 Achalasia of cardia: Secondary | ICD-10-CM

## 2020-10-11 DIAGNOSIS — K219 Gastro-esophageal reflux disease without esophagitis: Secondary | ICD-10-CM

## 2020-10-11 MED ORDER — ESOMEPRAZOLE MAGNESIUM 40 MG PO CPDR: 40.0000 mg | DELAYED_RELEASE_CAPSULE | Freq: Every day | ORAL | 5 refills | Status: DC

## 2020-10-11 NOTE — Patient Instructions (Signed)
Take Nexium/esomeprazole 30 minutes before breakfast daily. Take Plavix/clopidogrel in the evening to minimize drug interaction. Please call with progress report in 2 weeks. If swallowing difficulty persists will proceed with esophagogastroduodenoscopy and Botox injection for achalasia.

## 2020-10-11 NOTE — Progress Notes (Signed)
Presenting complaint;  Heartburn and dysphagia.  Database and subjective:  Patient is 75 year old Caucasian female who is here for scheduled visit.  She has chronic GERD maintained on PPI as well as history of type III achalasia for which she underwent Botox therapy in June 06, 2016 with symptomatic relief. Patient says she underwent coronary artery bypass graft on 04/27/2020.  Soon after discharge she developed copious diarrhea and was admitted to [facility] and seen in consultation by Dr. Jenetta Downer on 05/13/2021.  Patient states diarrhea suddenly resolved and she has not had any problems. Now she presents with frequent heartburn.  She says medication is not working anymore.  She has heartburn at least 3-4 times a week.  She also complains of dysphagia.  Dysphagia started few months ago.  She has difficulty with solids particularly bread and meat..  At times she has dysphagia on drinking liquids and or water.  Her appetite is good and her weight has been stable.  Her bowels move daily while she is on a stool softener.  Current Medications: Outpatient Encounter Medications as of 10/11/2020  Medication Sig  . acetaminophen (TYLENOL) 325 MG tablet Take 2 tablets (650 mg total) by mouth every 6 (six) hours as needed for mild pain.  Marland Kitchen albuterol (VENTOLIN HFA) 108 (90 Base) MCG/ACT inhaler   . ALPRAZolam (XANAX) 0.5 MG tablet Take 0.25 mg by mouth at bedtime as needed for anxiety.   Marland Kitchen aspirin EC 81 MG EC tablet Take 1 tablet (81 mg total) by mouth daily. Swallow whole.  . cetirizine (ZYRTEC) 10 MG tablet Take 10 mg by mouth daily.  . clopidogrel (PLAVIX) 75 MG tablet Take 1 tablet (75 mg total) by mouth daily.  Marland Kitchen ezetimibe (ZETIA) 10 MG tablet Take 1 tablet (10 mg total) by mouth daily.  Marland Kitchen glipiZIDE (GLUCOTROL XL) 5 MG 24 hr tablet Take 5 mg by mouth 2 (two) times daily.  . metoprolol (LOPRESSOR) 50 MG tablet Take 50 mg by mouth 2 (two) times daily.   . nitroGLYCERIN (NITROSTAT) 0.4 MG  SL tablet Place 1 tablet (0.4 mg total) under the tongue every 5 (five) minutes as needed for chest pain.  Marland Kitchen oxybutynin (DITROPAN-XL) 10 MG 24 hr tablet Take 10 mg by mouth daily.  . pantoprazole (PROTONIX) 40 MG tablet Take 40 mg by mouth daily.  . rosuvastatin (CRESTOR) 40 MG tablet Take 1 tablet (40 mg total) by mouth daily.  . Zinc Oxide (TRIPLE PASTE) 12.8 % ointment Apply topically as needed for irritation.   No facility-administered encounter medications on file as of 10/11/2020.     Objective: Blood pressure (!) 142/75, pulse (!) 56, temperature 97.9 F (36.6 C), temperature source Oral, height 5' 3"  (1.6 m), weight 145 lb 4.8 oz (65.9 kg). Patient is alert and in no acute distress. She is wearing a mask. Conjunctiva is pink. Sclera is nonicteric Oropharyngeal mucosa is normal. She has upper dentures in place. No neck masses or thyromegaly noted. Cardiac exam with regular rhythm normal S1 and S2. No murmur or gallop noted. Lungs are clear to auscultation. Abdomen is symmetrical soft with mild midepigastric tenderness.  No organomegaly or masses. No LE edema or clubbing noted.  Labs/studies Results:  CBC Latest Ref Rng & Units 08/31/2020 07/19/2020 05/14/2020  WBC 4.0 - 10.5 K/uL 7.4 6.7 8.4  Hemoglobin 12.0 - 15.0 g/dL 11.9(L) 11.4(L) 10.9(L)  Hematocrit 36.0 - 46.0 % 39.7 38.0 35.7(L)  Platelets 150 - 400 K/uL 330 298 531(H)    CMP  Latest Ref Rng & Units 08/31/2020 07/19/2020 05/17/2020  Glucose 70 - 99 mg/dL 119(H) 188(H) 128(H)  BUN 8 - 23 mg/dL 12 14 <5(L)  Creatinine 0.44 - 1.00 mg/dL 0.61 0.61 0.50  Sodium 135 - 145 mmol/L 141 138 139  Potassium 3.5 - 5.1 mmol/L 3.7 3.8 3.6  Chloride 98 - 111 mmol/L 104 103 102  CO2 22 - 32 mmol/L 27 26 28   Calcium 8.9 - 10.3 mg/dL 9.6 9.5 8.6(L)  Total Protein 6.5 - 8.1 g/dL 7.7 - -  Total Bilirubin 0.3 - 1.2 mg/dL 0.3 - -  Alkaline Phos 38 - 126 U/L 85 - -  AST 15 - 41 U/L 23 - -  ALT 0 - 44 U/L 18 - -    Hepatic Function  Latest Ref Rng & Units 08/31/2020 05/16/2020 05/14/2020  Total Protein 6.5 - 8.1 g/dL 7.7 5.3(L) 5.6(L)  Albumin 3.5 - 5.0 g/dL 4.1 2.4(L) 2.6(L)  AST 15 - 41 U/L 23 19 21   ALT 0 - 44 U/L 18 12 13   Alk Phosphatase 38 - 126 U/L 85 95 90  Total Bilirubin 0.3 - 1.2 mg/dL 0.3 0.2(L) <0.1(L)  Bilirubin, Direct 0.0 - 0.2 mg/dL <0.1 - -      Assessment:  #1.  Chronic GERD.  Pantoprazole is not working anymore.  She will be switched to another PPI and if it does not work would consider esophagogastroduodenoscopy.  #2.  Esophageal dysphagia secondary to known type III achalasia.  She had Botox therapy back in December 2017 and relief lasted for several months.  If dysphagia does not improve with control of her heartburn would consider retreating with Botox as she is not interested in surgical intervention.  Plan:  Discontinue pantoprazole. Begin esomeprazole 40 mg by mouth 30 minutes before breakfast daily. Patient advised to take Plavix/clopidogrel in the evening to minimize drug interaction with PPI. Patient will call with progress report in 2 weeks. Office visit in 6 months. Patient reminded to chew food thoroughly and eat slowly.

## 2020-10-14 NOTE — Telephone Encounter (Signed)
Dr. Gentry Fitz note has been forwarded to Dr. Harrington Challenger.

## 2020-10-15 DIAGNOSIS — I10 Essential (primary) hypertension: Secondary | ICD-10-CM | POA: Diagnosis not present

## 2020-10-15 DIAGNOSIS — E1165 Type 2 diabetes mellitus with hyperglycemia: Secondary | ICD-10-CM | POA: Diagnosis not present

## 2020-10-15 DIAGNOSIS — G5793 Unspecified mononeuropathy of bilateral lower limbs: Secondary | ICD-10-CM | POA: Diagnosis not present

## 2020-10-19 ENCOUNTER — Telehealth: Payer: Self-pay | Admitting: Podiatry

## 2020-10-19 NOTE — Telephone Encounter (Signed)
Pt left message checking to see if shoes have came in yet.  I returned call and left message that shoes have shipped and are scheduled to arrive sometime Friday but to call to schedule an appt for possibly mon/tues or Thursday next week to pick them up.Marland KitchenMarland Kitchen

## 2020-10-24 ENCOUNTER — Telehealth: Payer: Self-pay | Admitting: Podiatry

## 2020-10-24 NOTE — Telephone Encounter (Signed)
lvm for pt that reordered  diabetic shoes and inserts are in and to call to schedule an appt to pick them up. I believe she was wanting to see Dr Sherryle Lis on same day if possible.

## 2020-10-24 NOTE — Telephone Encounter (Signed)
Pt scheduled to puds on 5.17 and will schedule with Dr Sherryle Lis upon leaving this appt.

## 2020-10-25 DIAGNOSIS — E1165 Type 2 diabetes mellitus with hyperglycemia: Secondary | ICD-10-CM | POA: Diagnosis not present

## 2020-10-25 DIAGNOSIS — R0789 Other chest pain: Secondary | ICD-10-CM | POA: Diagnosis not present

## 2020-10-25 DIAGNOSIS — Z299 Encounter for prophylactic measures, unspecified: Secondary | ICD-10-CM | POA: Diagnosis not present

## 2020-10-25 DIAGNOSIS — I10 Essential (primary) hypertension: Secondary | ICD-10-CM | POA: Diagnosis not present

## 2020-10-25 DIAGNOSIS — I4891 Unspecified atrial fibrillation: Secondary | ICD-10-CM | POA: Diagnosis not present

## 2020-10-26 ENCOUNTER — Encounter (HOSPITAL_COMMUNITY): Payer: Medicare Other

## 2020-10-27 ENCOUNTER — Encounter (HOSPITAL_COMMUNITY): Payer: Medicare Other

## 2020-11-01 ENCOUNTER — Other Ambulatory Visit: Payer: Self-pay

## 2020-11-01 ENCOUNTER — Ambulatory Visit (INDEPENDENT_AMBULATORY_CARE_PROVIDER_SITE_OTHER): Payer: Medicare Other | Admitting: *Deleted

## 2020-11-01 DIAGNOSIS — L84 Corns and callosities: Secondary | ICD-10-CM | POA: Diagnosis not present

## 2020-11-01 DIAGNOSIS — M2042 Other hammer toe(s) (acquired), left foot: Secondary | ICD-10-CM | POA: Diagnosis not present

## 2020-11-01 DIAGNOSIS — E1140 Type 2 diabetes mellitus with diabetic neuropathy, unspecified: Secondary | ICD-10-CM | POA: Diagnosis not present

## 2020-11-01 DIAGNOSIS — M2032 Hallux varus (acquired), left foot: Secondary | ICD-10-CM | POA: Diagnosis not present

## 2020-11-01 NOTE — Progress Notes (Signed)
Patient presents today to pick up diabetic shoes and insoles.  Patient was dispensed 1 pair of diabetic shoes and 3 pairs of foam casted diabetic insoles. Fit was satisfactory. Instructions for break-in and wear was reviewed and a copy was given to the patient.   Re-appointment for regularly scheduled diabetic foot care visits or if they should experience any trouble with the shoes or insoles.  

## 2020-11-10 ENCOUNTER — Telehealth (INDEPENDENT_AMBULATORY_CARE_PROVIDER_SITE_OTHER): Payer: Self-pay | Admitting: Internal Medicine

## 2020-11-10 NOTE — Telephone Encounter (Signed)
Dr. Laural Golden was made aware and has the message. He states that he is going to review this and make a decision if he wants to refer her to another physician or do botox. He will contact the patient week after next with his decision.  Patient made aware.

## 2020-11-10 NOTE — Telephone Encounter (Signed)
Patient called the office stated she has been taking the medication Dr Laural Golden prescribed and it's not working - states she is still having swallowing issues - please advise - ph# [phone]

## 2020-11-15 ENCOUNTER — Encounter (HOSPITAL_COMMUNITY): Payer: Medicare Other

## 2020-11-16 DIAGNOSIS — Z299 Encounter for prophylactic measures, unspecified: Secondary | ICD-10-CM | POA: Diagnosis not present

## 2020-11-16 DIAGNOSIS — Z789 Other specified health status: Secondary | ICD-10-CM | POA: Diagnosis not present

## 2020-11-16 DIAGNOSIS — N1831 Chronic kidney disease, stage 3a: Secondary | ICD-10-CM | POA: Diagnosis not present

## 2020-11-16 DIAGNOSIS — I739 Peripheral vascular disease, unspecified: Secondary | ICD-10-CM | POA: Diagnosis not present

## 2020-11-16 DIAGNOSIS — I10 Essential (primary) hypertension: Secondary | ICD-10-CM | POA: Diagnosis not present

## 2020-11-16 DIAGNOSIS — E1165 Type 2 diabetes mellitus with hyperglycemia: Secondary | ICD-10-CM | POA: Diagnosis not present

## 2020-11-20 ENCOUNTER — Telehealth (INDEPENDENT_AMBULATORY_CARE_PROVIDER_SITE_OTHER): Payer: Self-pay | Admitting: Internal Medicine

## 2020-11-20 NOTE — Telephone Encounter (Signed)
Patient's call returned. Based on prior studies she has a GJ outflow obstruction. If she is agreeable she will be referred to [facility] for repeat manometry FLIP study and treatment. Patient advised to call office and let us know if he can proceed.

## 2020-11-23 ENCOUNTER — Ambulatory Visit (INDEPENDENT_AMBULATORY_CARE_PROVIDER_SITE_OTHER): Payer: Medicare Other | Admitting: Internal Medicine

## 2020-11-23 ENCOUNTER — Other Ambulatory Visit (HOSPITAL_COMMUNITY)
Admission: RE | Admit: 2020-11-23 | Discharge: 2020-11-23 | Disposition: A | Discharge status: Home / Self Care | Payer: Medicare Other | Source: Ambulatory Visit | Attending: Internal Medicine | Admitting: Internal Medicine

## 2020-11-23 ENCOUNTER — Other Ambulatory Visit: Payer: Self-pay

## 2020-11-23 ENCOUNTER — Ambulatory Visit: Payer: Medicare Other | Admitting: Nutrition

## 2020-11-23 ENCOUNTER — Encounter: Payer: Self-pay | Admitting: Internal Medicine

## 2020-11-23 ENCOUNTER — Telehealth: Payer: Self-pay | Admitting: Nutrition

## 2020-11-23 VITALS — BP 120/60 | HR 68 | Ht 63.0 in | Wt 152.0 lb

## 2020-11-23 DIAGNOSIS — I4891 Unspecified atrial fibrillation: Secondary | ICD-10-CM | POA: Insufficient documentation

## 2020-11-23 DIAGNOSIS — R072 Precordial pain: Secondary | ICD-10-CM | POA: Insufficient documentation

## 2020-11-23 LAB — BASIC METABOLIC PANEL
Anion gap: 7 (ref 5–15)
BUN: 12 mg/dL (ref 8–23)
CO2: 27 mmol/L (ref 22–32)
Calcium: 9.5 mg/dL (ref 8.9–10.3)
Chloride: 105 mmol/L (ref 98–111)
Creatinine, Ser: 0.71 mg/dL (ref 0.44–1.00)
GFR, Estimated: >60 mL/min (ref >60)
Glucose, Bld: 147 mg/dL — ABNORMAL HIGH (ref 70–99)
Potassium: 3.9 mmol/L (ref 3.5–5.1)
Sodium: 139 mmol/L (ref 135–145)

## 2020-11-23 LAB — CBC WITH DIFFERENTIAL/PLATELET
Abs Immature Granulocytes: 0.03 10*3/uL (ref 0.00–0.07)
Basophils Absolute: 0.1 10*3/uL (ref 0.0–0.1)
Basophils Relative: 1 %
Eosinophils Absolute: 0.1 10*3/uL (ref 0.0–0.5)
Eosinophils Relative: 1 %
HCT: 40 % (ref 36.0–46.0)
Hemoglobin: 12.1 g/dL (ref 12.0–15.0)
Immature Granulocytes: 0 %
Lymphocytes Relative: 34 %
Lymphs Abs: 3.1 10*3/uL (ref 0.7–4.0)
MCH: 24.8 pg — ABNORMAL LOW (ref 26.0–34.0)
MCHC: 30.3 g/dL (ref 30.0–36.0)
MCV: 82 fL (ref 80.0–100.0)
Monocytes Absolute: 0.5 10*3/uL (ref 0.1–1.0)
Monocytes Relative: 6 %
Neutro Abs: 5.3 10*3/uL (ref 1.7–7.7)
Neutrophils Relative %: 58 %
Platelets: 367 10*3/uL (ref 150–400)
RBC: 4.88 MIL/uL (ref 3.87–5.11)
RDW: 15.8 % — ABNORMAL HIGH (ref 11.5–15.5)
WBC: 9.2 10*3/uL (ref 4.0–10.5)
nRBC: 0 % (ref 0.0–0.2)

## 2020-11-23 LAB — URINALYSIS, COMPLETE (UACMP) WITH MICROSCOPIC
Bacteria, UA: NONE SEEN
Bilirubin Urine: NEGATIVE
Glucose, UA: NEGATIVE mg/dL
Ketones, ur: NEGATIVE mg/dL
Leukocytes,Ua: NEGATIVE
Nitrite: NEGATIVE
Protein, ur: NEGATIVE mg/dL
Specific Gravity, Urine: 1.011 (ref 1.005–1.030)
pH: 5 (ref 5.0–8.0)

## 2020-11-23 LAB — LIPID PANEL
Cholesterol: 207 mg/dL — ABNORMAL HIGH (ref 0–200)
HDL: 49 mg/dL (ref >40)
LDL Cholesterol: 108 mg/dL — ABNORMAL HIGH (ref 0–99)
Total CHOL/HDL Ratio: 4.2 RATIO
Triglycerides: 250 mg/dL — ABNORMAL HIGH (ref <150)
VLDL: 50 mg/dL — ABNORMAL HIGH (ref 0–40)

## 2020-11-23 LAB — BRAIN NATRIURETIC PEPTIDE: B Natriuretic Peptide: 109 pg/mL — ABNORMAL HIGH (ref 0.0–100.0)

## 2020-11-23 LAB — SEDIMENTATION RATE: Sed Rate: 10 mm/hr (ref 0–22)

## 2020-11-23 NOTE — Telephone Encounter (Signed)
vm left to call and reshedule missed appt.

## 2020-11-23 NOTE — Patient Instructions (Signed)
Medication Instructions:  Your physician recommends that you continue on your current medications as directed. Please refer to the Current Medication list given to you today.  *If you need a refill on your cardiac medications before your next appointment, please call your pharmacy*   Lab Work: Your physician recommends that you return for lab work in: Today   If you have labs (blood work) drawn today and your tests are completely normal, you will receive your results only by: Marland Kitchen MyChart Message (if you have MyChart) OR . A paper copy in the mail If you have any lab test that is abnormal or we need to change your treatment, we will call you to review the results.   Testing/Procedures: NONE    Follow-Up: At [facility], you and your health needs are our priority.  As part of our continuing mission to provide you with exceptional heart care, we have created designated Provider Care Teams.  These Care Teams include your primary Cardiologist (physician) and Advanced Practice Providers (APPs -  Physician Assistants and Nurse Practitioners) who all work together to provide you with the care you need, when you need it.  We recommend signing up for the patient portal called "MyChart".  Sign up information is provided on this After Visit Summary.  MyChart is used to connect with patients for Virtual Visits (Telemedicine).  Patients are able to view lab/test results, encounter notes, upcoming appointments, etc.  Non-urgent messages can be sent to your provider as well.   To learn more about what you can do with MyChart, go to [email or web address].    Your next appointment:   2 month(s)  The format for your next appointment:   In Person  Provider:   Dorris Carnes, MD or Bernerd Pho, PA-C   Other Instructions Thank you for choosing [facility]!

## 2020-11-23 NOTE — Progress Notes (Signed)
Cardiology Office Note   Date:  11/23/2020   ID:  Katrina Castro, DOB [birth year], MRN [id number]  PCP:  Glenda Chroman, MD  Cardiologist:   Dorris Carnes, MD    F/U of CAD    History of Present Illness: Katrina Castro is a 75 y.o. female with a history of CAD (s/p CAVG in Nov 2021 (LIMA to LAD; SVG to PDA, PLSA; L radial to RI, OM1), HTN, HL and Type II DM   Post CABG had post op afib, UTI, diarrhea.  Hosp in Nov 2021 with C diff colitis and ? Pancreatitis    The pt has also had chronic chest pains    I saw the pt in March 2022   She was doing pretty good at that time   The pt presents today complaining of R sided CP   Pain worse with motion and pressing on the chest   Also has some L parasternal pain    She notes some SOB   Has problems moving because of pain with motion   Using cream on chest  Denies fever, chills     Pt says she is a little dizzy   No syncope    Current Meds  Medication Sig  . acetaminophen (TYLENOL) 325 MG tablet Take 2 tablets (650 mg total) by mouth every 6 (six) hours as needed for mild pain.  Marland Kitchen albuterol (VENTOLIN HFA) 108 (90 Base) MCG/ACT inhaler   . ALPRAZolam (XANAX) 0.5 MG tablet Take 0.25 mg by mouth at bedtime as needed for anxiety.   Marland Kitchen aspirin EC 81 MG EC tablet Take 1 tablet (81 mg total) by mouth daily. Swallow whole.  . cetirizine (ZYRTEC) 10 MG tablet Take 10 mg by mouth daily.  . clopidogrel (PLAVIX) 75 MG tablet Take 1 tablet (75 mg total) by mouth daily.  Marland Kitchen esomeprazole (NEXIUM) 40 MG capsule Take 1 capsule (40 mg total) by mouth daily before breakfast.  . ezetimibe (ZETIA) 10 MG tablet Take 1 tablet (10 mg total) by mouth daily.  Marland Kitchen glipiZIDE (GLUCOTROL XL) 5 MG 24 hr tablet Take 5 mg by mouth 2 (two) times daily.  . metoprolol (LOPRESSOR) 50 MG tablet Take 50 mg by mouth 2 (two) times daily.   . nitroGLYCERIN (NITROSTAT) 0.4 MG SL tablet Place 1 tablet (0.4 mg total) under the tongue every 5 (five) minutes as needed for chest pain.  Marland Kitchen  oxybutynin (DITROPAN-XL) 10 MG 24 hr tablet Take 10 mg by mouth daily.  . rosuvastatin (CRESTOR) 40 MG tablet Take 1 tablet (40 mg total) by mouth daily.     Allergies:   Levofloxacin, Cardizem [diltiazem], Sulfa antibiotics, Diazepam, and Lipitor [atorvastatin]   Past Medical History:  Diagnosis Date  . Arthritis   . CAD (coronary artery disease)    a. s/p CABG on 04/27/2020 with LIMA-LAD, seq SVG-PDA-PL, seq left radial-RI-OM  . Concussion    2015  . Depression   . Diabetes ([place])    type 2  . Dysphagia   . Dysrhythmia    afib  . GERD (gastroesophageal reflux disease)   . Headache   . History of bronchitis   . Hypertension   . Seasonal allergies   . Toenail fungus     Past Surgical History:  Procedure Laterality Date  . APPENDECTOMY    . BACK SURGERY     x2  . BOTOX INJECTION N/A 06/06/2016   Procedure: BOTOX INJECTION;  Surgeon: Rogene Houston, MD;  [facility];  Service: Endoscopy;  Laterality: N/A;  . CHOLECYSTECTOMY    . COLONOSCOPY N/A 11/05/2012   Procedure: COLONOSCOPY;  Surgeon: Rogene Houston, MD;  [facility];  Service: Endoscopy;  Laterality: N/A;  1030  . COLONOSCOPY WITH PROPOFOL N/A 05/01/2019   Procedure: COLONOSCOPY WITH PROPOFOL;  Surgeon: Rogene Houston, MD;  [facility];  Service: Endoscopy;  Laterality: N/A;  11:20am  . CORONARY ARTERY BYPASS GRAFT N/A 04/27/2020   Procedure: CORONARY ARTERY BYPASS GRAFTING (CABG) TIMES FIVE USING LEFT INTERNAL MAMMARY ARTERY, LEFT HARVESTED RADIAL ARTERY, RIGHT GREATER SAPHENOUS VEIN HARVESTED ENDOSCOPICALLY.;  Surgeon: [name], MD;  Location: [place];  Service: Open Heart Surgery;  Laterality: N/A;  . ESOPHAGEAL DILATION N/A 07/06/2015   Procedure: ESOPHAGEAL DILATION;  Surgeon: Rogene Houston, MD;  [facility];  Service: Endoscopy;  Laterality: N/A;  . ESOPHAGEAL DILATION N/A 06/06/2016   Procedure: ESOPHAGEAL DILATION;  Surgeon: Rogene Houston, MD;   [facility];  Service: Endoscopy;  Laterality: N/A;  . ESOPHAGOGASTRODUODENOSCOPY N/A 02/12/2014   Procedure: ESOPHAGOGASTRODUODENOSCOPY (EGD);  Surgeon: Rogene Houston, MD;  [facility];  Service: Endoscopy;  Laterality: N/A;  150  . ESOPHAGOGASTRODUODENOSCOPY N/A 07/06/2015   Procedure: ESOPHAGOGASTRODUODENOSCOPY (EGD);  Surgeon: Rogene Houston, MD;  [facility];  Service: Endoscopy;  Laterality: N/A;  1:25 - moved to 1/18 @ 10:30 - Ann to notify pt  . ESOPHAGOGASTRODUODENOSCOPY (EGD) WITH ESOPHAGEAL DILATION N/A 07/25/2012   Procedure: ESOPHAGOGASTRODUODENOSCOPY (EGD) WITH ESOPHAGEAL DILATION;  Surgeon: Rogene Houston, MD;  [facility];  Service: Endoscopy;  Laterality: N/A;  325-rescheduled to [place] notified pt  . ESOPHAGOGASTRODUODENOSCOPY (EGD) WITH PROPOFOL N/A 06/06/2016   Procedure: ESOPHAGOGASTRODUODENOSCOPY (EGD) WITH PROPOFOL;  Surgeon: Rogene Houston, MD;  [facility];  Service: Endoscopy;  Laterality: N/A;  . EYE SURGERY     cataracts removed  . Foot surgeries Bilateral    hammer toes  . LEFT HEART CATH AND CORONARY ANGIOGRAPHY N/A 04/26/2020   Procedure: LEFT HEART CATH AND CORONARY ANGIOGRAPHY;  Surgeon: Martinique, Peter M, MD;  Location: [place] [facility];  Service: Cardiovascular;  Laterality: N/A;  . Venia Minks DILATION N/A 02/12/2014   Procedure: Venia Minks DILATION;  Surgeon: Rogene Houston, MD;  [facility];  Service: Endoscopy;  Laterality: N/A;  . POLYPECTOMY  05/01/2019   Procedure: POLYPECTOMY;  Surgeon: Rogene Houston, MD;  [facility];  Service: Endoscopy;;  colon   . RADIAL ARTERY HARVEST Left 04/27/2020   Procedure: LEFT RADIAL ARTERY HARVEST;  Surgeon: [name], MD;  Location: [place];  Service: Open Heart Surgery;  Laterality: Left;  . Right knee arthroscopy     x2  . TEE WITHOUT CARDIOVERSION N/A 04/27/2020   Procedure: TRANSESOPHAGEAL ECHOCARDIOGRAM (TEE);  Surgeon: [name], MD;  Location: [place];  Service: Open Heart Surgery;  Laterality: N/A;  . TONSILLECTOMY    . TOTAL ABDOMINAL HYSTERECTOMY    . TOTAL KNEE ARTHROPLASTY Right 04/03/2017   Procedure: RIGHT TOTAL KNEE ARTHROPLASTY;  Surgeon: Latanya Maudlin, MD;  Location: [facility];  Service: Orthopedics;  Laterality: Right;     Social History:  The patient  reports that she has never smoked. She has never used smokeless tobacco. She reports that she does not drink alcohol and does not use drugs.   Family History:  The patient's family history includes Diabetes in her mother and  sister; Stroke in her father.    ROS:  Please see the history of present illness. All other systems are reviewed and  Negative to the above problem except as noted.    PHYSICAL EXAM: VS:  BP 120/60 (BP Location: Left Arm, Patient Position: Sitting, Cuff Size: Normal)   Pulse 68   Ht 5' 3"  (1.6 m)   Wt 152 lb (68.9 kg)   SpO2 96%   BMI 26.93 kg/m   GEN: Well nourished, well developed, in no acute distress  HEENT: normal  Neck: no JVD, Cardiac: RRR; no murmurs No LE edema  Chest   Extremely tender to palpation R chest , L parasternal area  Respiratory:  clear to auscultation bilaterally,  GI: soft, nontender, nondistended, + BS  No hepatomegaly  MS: no deformity Moving all extremities   Skin: warm and dry, no rash Neuro:  Strength and sensation are intact Psych: euthymic mood, full affect   EKG:  EKG is ordered today.  SR  68 bpm      Lipid Panel    Component Value Date/Time   CHOL 207 (H) 11/23/2020 1519   CHOL 217 (H) 03/31/2020 1208   TRIG 250 (H) 11/23/2020 1519   HDL 49 11/23/2020 1519   HDL 38 (L) 03/31/2020 1208   CHOLHDL 4.2 11/23/2020 1519   VLDL 50 (H) 11/23/2020 1519   LDLCALC 108 (H) 11/23/2020 1519   LDLCALC 144 (H) 03/31/2020 1208      Wt Readings from Last 3 Encounters:  11/23/20 152 lb (68.9 kg)  10/11/20 145 lb 4.8 oz (65.9 kg)  08/31/20 144 lb 12.8 oz (65.7 kg)      ASSESSMENT  AND PLAN:  1 Chest pain   Pt presents today with signif CP and some SOB  The pain she is having appears to be chest wall pain  I do not think it is cardiac    She is using Capsaicin cream some     Will check ESR , ANA       Could be associated though does not explain L parasternal pain    ? Trauma around time of surgery   But, it is more severe now than it was then   Daughter says she does do a lot of activlty     2  Dizziness  BP is OK   Will Check UA for specific gravity  Encouraged her to drink fluids     3 CAD  Pt is s/p CABG in Nov 2021  Stay on ASA and Plavix for 1 year since she had an acute event at time   4  HTN  BP is controlled  5   HL   Will get lipids today  7  GI   Esophageal problems Follows in GI    Labs:  CBC, WESR, ANA, lipids, BMET, BNP and UA   Plan for f/u in Aug 2022    Spoke with daughter   She says mom does do a lot of physical activity   Moving trash cans.  May have overexerted.    Current medicines are reviewed at length with the patient today.  The patient does not have concerns regarding medicines.  Signed, Dorris Carnes, MD  11/23/2020 5:09 PM    [facility], [place], [place]  [address] Phone: [phone]; Fax: [phone]

## 2020-11-24 LAB — ANA: Anti Nuclear Antibody (ANA): NEGATIVE

## 2020-11-25 ENCOUNTER — Telehealth: Payer: Self-pay

## 2020-11-25 DIAGNOSIS — E785 Hyperlipidemia, unspecified: Secondary | ICD-10-CM

## 2020-11-25 MED ORDER — ROSUVASTATIN CALCIUM 40 MG PO TABS: 40.0000 mg | ORAL_TABLET | Freq: Every day | ORAL | 2 refills | Status: DC

## 2020-11-25 NOTE — Telephone Encounter (Signed)
-----   Message from Dorris Carnes V, MD sent at 11/23/2020 10:40 PM EDT ----- Urine is concentrated    Drink more fluids   Marker of inflammation is normal  Fluid is minimally increased  Electrolytes and kidney function are OK   CBC is OK Lipids   LDL is higher than it wa in march   Is hs taking Zetia and Crestor ?  LDL wa 82 and 88. Recheck lipids  in 2 months.  If still high in 2 months would recomm Repatha

## 2020-11-25 NOTE — Telephone Encounter (Signed)
Pt notified and verbalized understanding.

## 2020-11-28 ENCOUNTER — Telehealth: Payer: Self-pay | Admitting: Internal Medicine

## 2020-11-28 NOTE — Telephone Encounter (Signed)
Patient called to let Dr. Harrington Challenger know the new med-rosuvastatin (CRESTOR) 40 MG tablet for cholesterol is making her feel dizzy/funny. She started off at 10mg  then Dr increased to 40mg . She would like to know what she can do.

## 2020-11-28 NOTE — Telephone Encounter (Signed)
Left message to return call 

## 2020-11-29 ENCOUNTER — Telehealth: Payer: Self-pay | Admitting: Internal Medicine

## 2020-11-29 NOTE — Telephone Encounter (Signed)
Low sodium, low fat diet mailed to patient.

## 2020-11-29 NOTE — Telephone Encounter (Signed)
I spoke with patient.She was just changed to glipizide from metformin and has had some nite sweats. She has not checked her blood sugar either. I advised her to touch base with Dr.Vyas. She will take her Crestor at bedtime and let us know how she does.

## 2020-11-29 NOTE — Telephone Encounter (Signed)
Pt would like to know if there is a diet that Dr. Harrington Challenger can recommend

## 2020-12-13 DIAGNOSIS — E7800 Pure hypercholesterolemia, unspecified: Secondary | ICD-10-CM | POA: Diagnosis not present

## 2020-12-13 DIAGNOSIS — I10 Essential (primary) hypertension: Secondary | ICD-10-CM | POA: Diagnosis not present

## 2020-12-13 DIAGNOSIS — Z299 Encounter for prophylactic measures, unspecified: Secondary | ICD-10-CM | POA: Diagnosis not present

## 2020-12-13 DIAGNOSIS — Z0000 Encounter for general adult medical examination without abnormal findings: Secondary | ICD-10-CM | POA: Diagnosis not present

## 2020-12-13 DIAGNOSIS — R5383 Other fatigue: Secondary | ICD-10-CM | POA: Diagnosis not present

## 2020-12-13 DIAGNOSIS — Z7189 Other specified counseling: Secondary | ICD-10-CM | POA: Diagnosis not present

## 2020-12-13 DIAGNOSIS — E559 Vitamin D deficiency, unspecified: Secondary | ICD-10-CM | POA: Diagnosis not present

## 2020-12-13 DIAGNOSIS — Z789 Other specified health status: Secondary | ICD-10-CM | POA: Diagnosis not present

## 2020-12-13 DIAGNOSIS — Z79899 Other long term (current) drug therapy: Secondary | ICD-10-CM | POA: Diagnosis not present

## 2020-12-13 DIAGNOSIS — E539 Vitamin B deficiency, unspecified: Secondary | ICD-10-CM | POA: Diagnosis not present

## 2020-12-15 DIAGNOSIS — E1165 Type 2 diabetes mellitus with hyperglycemia: Secondary | ICD-10-CM | POA: Diagnosis not present

## 2020-12-15 DIAGNOSIS — G5793 Unspecified mononeuropathy of bilateral lower limbs: Secondary | ICD-10-CM | POA: Diagnosis not present

## 2020-12-15 DIAGNOSIS — I10 Essential (primary) hypertension: Secondary | ICD-10-CM | POA: Diagnosis not present

## 2020-12-21 DIAGNOSIS — D509 Iron deficiency anemia, unspecified: Secondary | ICD-10-CM | POA: Diagnosis not present

## 2020-12-21 DIAGNOSIS — E559 Vitamin D deficiency, unspecified: Secondary | ICD-10-CM | POA: Diagnosis not present

## 2020-12-21 DIAGNOSIS — R5383 Other fatigue: Secondary | ICD-10-CM | POA: Diagnosis not present

## 2020-12-21 DIAGNOSIS — I10 Essential (primary) hypertension: Secondary | ICD-10-CM | POA: Diagnosis not present

## 2020-12-21 DIAGNOSIS — Z299 Encounter for prophylactic measures, unspecified: Secondary | ICD-10-CM | POA: Diagnosis not present

## 2020-12-21 DIAGNOSIS — E538 Deficiency of other specified B group vitamins: Secondary | ICD-10-CM | POA: Diagnosis not present

## 2020-12-22 ENCOUNTER — Telehealth: Payer: Self-pay | Admitting: Internal Medicine

## 2020-12-22 DIAGNOSIS — E538 Deficiency of other specified B group vitamins: Secondary | ICD-10-CM | POA: Diagnosis not present

## 2020-12-22 NOTE — Telephone Encounter (Signed)
Per phone call from pt- she's wanting to make Dr. Harrington Challenger aware that all of her lab work from Dr. Woody Seller is abnormal and she's in the "danger zone"  Please call [phone] cell

## 2020-12-22 NOTE — Telephone Encounter (Signed)
Spoke with pt and notified that we would request recent labs from Dr. Woody Seller and send to Dr. Harrington Challenger.

## 2020-12-26 ENCOUNTER — Telehealth (INDEPENDENT_AMBULATORY_CARE_PROVIDER_SITE_OTHER): Payer: Self-pay

## 2020-12-26 NOTE — Telephone Encounter (Signed)
Thanks

## 2020-12-26 NOTE — Telephone Encounter (Signed)
Patient called today stating she has been having issues with her food coming back up through her nose and mouth followed by phlegm and she gets strangled only when she eats bread. She was adamant  that she wanted to see Dr.Rehman only. When told that Dr. Laural Golden has no appointments till the fall of the year she wanted to go to the Ed to see if they would call him in as a consult. I advised he was off today and would not be the one on call even if the Ed Doctor felt a consult was needed. I advised if she felt she needed to go to the Ed then she needed to go. I offered the patient an appointment with Dr. Jenetta Downer on Thursday at 11 am. She is aware that if she felt the need to go to the hospital then to go,but other wise Dr. Jenetta Downer would see her on 12/29/2020 at 11 am. No nausea,vomiting or fevers.

## 2020-12-29 ENCOUNTER — Ambulatory Visit (INDEPENDENT_AMBULATORY_CARE_PROVIDER_SITE_OTHER): Payer: Medicare Other | Admitting: Gastroenterology

## 2020-12-29 ENCOUNTER — Encounter (INDEPENDENT_AMBULATORY_CARE_PROVIDER_SITE_OTHER): Payer: Self-pay | Admitting: Gastroenterology

## 2020-12-29 ENCOUNTER — Other Ambulatory Visit: Payer: Self-pay

## 2020-12-29 VITALS — BP 112/62 | HR 76 | Temp 98.5°F | Ht 62.5 in | Wt 152.6 lb

## 2020-12-29 DIAGNOSIS — E538 Deficiency of other specified B group vitamins: Secondary | ICD-10-CM | POA: Diagnosis not present

## 2020-12-29 DIAGNOSIS — K5900 Constipation, unspecified: Secondary | ICD-10-CM | POA: Insufficient documentation

## 2020-12-29 DIAGNOSIS — K5903 Drug induced constipation: Secondary | ICD-10-CM

## 2020-12-29 DIAGNOSIS — K220 Achalasia of cardia: Secondary | ICD-10-CM | POA: Diagnosis not present

## 2020-12-29 NOTE — Patient Instructions (Signed)
Discuss with daughter regarding referral to [facility] for possible POEM procedure (best procedure) vs injection with Botox Start taking Miralax 1 capful every day for one week. If bowel movements do not improve, increase to 1 capful every 12 hours. If after two weeks there is no improvement, increase to 1 capful every 8 hours

## 2020-12-29 NOTE — H&P (View-Only) (Signed)
Katrina Castro, M.D. Gastroenterology & Hepatology [facility] [address] [place], [place] [address]  Primary Care Physician: Glenda Chroman, MD [place] [address]  I will communicate my assessment and recommendations to the referring MD via EMR.  Problems: Type III achalasia Constipation due to iron supplementation  History of Present Illness: Katrina Castro is a 75 y.o. female past medical history of c. Difficile, coronary artery disease status post CABG, hypertension, GERD, type III achalasia, type 2 diabetes and depression, who presents for follow up of type III achalasia and constipation.  The patient was last seen on 10/11/2020. At that time, the patient was advised to take Nexium 40 mg before breakfast and to continue chewing thoroughly her food to avoid any issues with dysphagia.  The patient reports that she has been taking iron and vitamin B12 for anemia. Last Hb was 12.1 in 11/23/2020.  She states that even though she has tolerated well her iron, she has noticed that while taking she has presented worsening bloating and has had a bowel every other day which is different from her usual. She does not take any laxative, only stool softener daily. She took Dulcolax last week to have a BM as she had to strain to have bowel movements.  She also reports that for the last week she has presented episodes of regurgitation and discomfort in her mid chest. She states that for the last 6-8 months she has had dysphagia especially when eating bread or meat, which she has avoided to improve her symptoms.  However, she has noticed that more recently even regular soft food causes persistent and recurrent symptoms. She reports having significant choking spells when eating food in general -she is concerned that she may aspirate some of the food.  Occasionally she has some heartburn and belching which she does not think has improved with  the PPI. She gained some lb recently. The patient denies having any nausea, vomiting, fever, chills, hematochezia, melena, hematemesis, abdominal distention, abdominal pain, diarrhea, jaundice, pruritus.  Notably, the patient had a CABG in 04/2020 at [facility] after presenting with exertional anginal symptoms.  These have much more improved after she underwent her surgery.  She is currently on Plavix for this.  Last EGD:06/14/2016, single erosion was found with GE junction.  There was presence of Schatzki's ring at the GE junction which was dilated with a 56 [place] dilator.  There was also spasticity of the esophageal body with spastic lower esophageal sphincter.  This was injected with Botox.  There was presence of a 3 cm hiatal hernia.  Normal stomach and duodenum. Last Colonoscopy:05/01/2019, normal terminal ileum, there was a small polyp in the proximal sigmoid colon which was removed with forceps ().  Past Medical History: Past Medical History:  Diagnosis Date   Arthritis    CAD (coronary artery disease)    a. s/p CABG on 04/27/2020 with LIMA-LAD, seq SVG-PDA-PL, seq left radial-RI-OM   Concussion    2015   Depression    Diabetes ([place])    type 2   Dysphagia    Dysrhythmia    afib   GERD (gastroesophageal reflux disease)    Headache    History of bronchitis    Hypertension    Seasonal allergies    Toenail fungus     Past Surgical History: Past Surgical History:  Procedure Laterality Date   APPENDECTOMY     BACK SURGERY     x2  BOTOX INJECTION N/A 06/06/2016   Procedure: BOTOX INJECTION;  Surgeon: Rogene Houston, MD;  [facility];  Service: Endoscopy;  Laterality: N/A;   CHOLECYSTECTOMY     COLONOSCOPY N/A 11/05/2012   Procedure: COLONOSCOPY;  Surgeon: Rogene Houston, MD;  [facility];  Service: Endoscopy;  Laterality: N/A;  1030   COLONOSCOPY WITH PROPOFOL N/A 05/01/2019   Procedure: COLONOSCOPY WITH PROPOFOL;  Surgeon: Rogene Houston, MD;   [facility];  Service: Endoscopy;  Laterality: N/A;  11:20am   CORONARY ARTERY BYPASS GRAFT N/A 04/27/2020   Procedure: CORONARY ARTERY BYPASS GRAFTING (CABG) TIMES FIVE USING LEFT INTERNAL MAMMARY ARTERY, LEFT HARVESTED RADIAL ARTERY, RIGHT GREATER SAPHENOUS VEIN HARVESTED ENDOSCOPICALLY.;  Surgeon: [name], MD;  Location: [place];  Service: Open Heart Surgery;  Laterality: N/A;   ESOPHAGEAL DILATION N/A 07/06/2015   Procedure: ESOPHAGEAL DILATION;  Surgeon: Rogene Houston, MD;  [facility];  Service: Endoscopy;  Laterality: N/A;   ESOPHAGEAL DILATION N/A 06/06/2016   Procedure: ESOPHAGEAL DILATION;  Surgeon: Rogene Houston, MD;  [facility];  Service: Endoscopy;  Laterality: N/A;   ESOPHAGOGASTRODUODENOSCOPY N/A 02/12/2014   Procedure: ESOPHAGOGASTRODUODENOSCOPY (EGD);  Surgeon: Rogene Houston, MD;  [facility];  Service: Endoscopy;  Laterality: N/A;  150   ESOPHAGOGASTRODUODENOSCOPY N/A 07/06/2015   Procedure: ESOPHAGOGASTRODUODENOSCOPY (EGD);  Surgeon: Rogene Houston, MD;  [facility];  Service: Endoscopy;  Laterality: N/A;  1:25 - moved to 1/18 @ 10:30 - Ann to notify pt   ESOPHAGOGASTRODUODENOSCOPY (EGD) WITH ESOPHAGEAL DILATION N/A 07/25/2012   Procedure: ESOPHAGOGASTRODUODENOSCOPY (EGD) WITH ESOPHAGEAL DILATION;  Surgeon: Rogene Houston, MD;  [facility];  Service: Endoscopy;  Laterality: N/A;  325-rescheduled to [place] notified pt   ESOPHAGOGASTRODUODENOSCOPY (EGD) WITH PROPOFOL N/A 06/06/2016   Procedure: ESOPHAGOGASTRODUODENOSCOPY (EGD) WITH PROPOFOL;  Surgeon: Rogene Houston, MD;  [facility];  Service: Endoscopy;  Laterality: N/A;   EYE SURGERY     cataracts removed   Foot surgeries Bilateral    hammer toes   LEFT HEART CATH AND CORONARY ANGIOGRAPHY N/A 04/26/2020   Procedure: LEFT HEART CATH AND CORONARY ANGIOGRAPHY;  Surgeon: Martinique, Peter M, MD;  Location: [place] [facility];  Service:  Cardiovascular;  Laterality: N/A;   MALONEY DILATION N/A 02/12/2014   Procedure: Venia Minks DILATION;  Surgeon: Rogene Houston, MD;  [facility];  Service: Endoscopy;  Laterality: N/A;   POLYPECTOMY  05/01/2019   Procedure: POLYPECTOMY;  Surgeon: Rogene Houston, MD;  [facility];  Service: Endoscopy;;  colon    RADIAL ARTERY HARVEST Left 04/27/2020   Procedure: LEFT RADIAL ARTERY HARVEST;  Surgeon: [name], MD;  Location: [place];  Service: Open Heart Surgery;  Laterality: Left;   Right knee arthroscopy     x2   TEE WITHOUT CARDIOVERSION N/A 04/27/2020   Procedure: TRANSESOPHAGEAL ECHOCARDIOGRAM (TEE);  Surgeon: [name], MD;  Location: [place];  Service: Open Heart Surgery;  Laterality: N/A;   TONSILLECTOMY     TOTAL ABDOMINAL HYSTERECTOMY     TOTAL KNEE ARTHROPLASTY Right 04/03/2017   Procedure: RIGHT TOTAL KNEE ARTHROPLASTY;  Surgeon: Latanya Maudlin, MD;  Location: [facility];  Service: Orthopedics;  Laterality: Right;    Family History: Family History  Problem Relation Age of Onset   Stroke Father    Diabetes Mother    Diabetes Sister    Colon cancer Neg  Hx     Social History: Social History   Tobacco Use  Smoking Status Never  Smokeless Tobacco Never   Social History   Substance and Sexual Activity  Alcohol Use No   Alcohol/week: 0.0 standard drinks   Comment: socially    Social History   Substance and Sexual Activity  Drug Use No    Allergies: Allergies  Allergen Reactions   Levofloxacin Anaphylaxis and Rash   Cardizem [Diltiazem] Other (See Comments)    Patient states that she could not think, felt that she was in the twilight zone. Arm discomfort.   Sulfa Antibiotics Itching   Diazepam Nausea Only   Lipitor [Atorvastatin] Other (See Comments)    Caused her body to be "out of whack"  - elevated sugar also.    Medications: Current Outpatient Medications  Medication Sig Dispense Refill   acetaminophen (TYLENOL) 325  MG tablet Take 2 tablets (650 mg total) by mouth every 6 (six) hours as needed for mild pain.     albuterol (VENTOLIN HFA) 108 (90 Base) MCG/ACT inhaler Inhale 1 puff into the lungs every 4 (four) hours as needed.     ALPRAZolam (XANAX) 0.5 MG tablet Take 0.25 mg by mouth at bedtime as needed for anxiety.      aspirin EC 81 MG EC tablet Take 1 tablet (81 mg total) by mouth daily. Swallow whole. 30 tablet 11   cetirizine (ZYRTEC) 10 MG tablet Take 10 mg by mouth daily.     clopidogrel (PLAVIX) 75 MG tablet Take 1 tablet (75 mg total) by mouth daily. 90 tablet 3   esomeprazole (NEXIUM) 40 MG capsule Take 1 capsule (40 mg total) by mouth daily before breakfast. 30 capsule 5   ezetimibe (ZETIA) 10 MG tablet Take 1 tablet (10 mg total) by mouth daily. 90 tablet 3   glipiZIDE (GLUCOTROL XL) 5 MG 24 hr tablet Take 5 mg by mouth 2 (two) times daily.     metoprolol (LOPRESSOR) 50 MG tablet Take 50 mg by mouth 2 (two) times daily.      nitroGLYCERIN (NITROSTAT) 0.4 MG SL tablet Place 1 tablet (0.4 mg total) under the tongue every 5 (five) minutes as needed for chest pain. 25 tablet 3   oxybutynin (DITROPAN-XL) 10 MG 24 hr tablet Take 10 mg by mouth daily.     rosuvastatin (CRESTOR) 40 MG tablet Take 1 tablet (40 mg total) by mouth daily. 90 tablet 2   No current facility-administered medications for this visit.    Review of Systems: GENERAL: negative for malaise, night sweats HEENT: No changes in hearing or vision, no nose bleeds or other nasal problems. NECK: Negative for lumps, goiter, pain and significant neck swelling RESPIRATORY: Negative for cough, wheezing CARDIOVASCULAR: Negative for chest pain, leg swelling, palpitations, orthopnea GI: SEE HPI MUSCULOSKELETAL: Negative for joint pain or swelling, back pain, and muscle pain. SKIN: Negative for lesions, rash PSYCH: Negative for sleep disturbance, mood disorder and recent psychosocial stressors. HEMATOLOGY Negative for prolonged bleeding,  bruising easily, and swollen nodes. ENDOCRINE: Negative for cold or heat intolerance, polyuria, polydipsia and goiter. NEURO: negative for tremor, gait imbalance, syncope and seizures. The remainder of the review of systems is noncontributory.   Physical Exam: BP 112/62 (BP Location: Right Arm, Patient Position: Sitting, Cuff Size: Large)   Pulse 76   Temp 98.5 F (36.9 C) (Oral)   Ht 5' 2.5" (1.588 m)   Wt 152 lb 9.6 oz (69.2 kg)   BMI 27.47 kg/m  GENERAL:  The patient is AO x3, in no acute distress. HEENT: Head is normocephalic and atraumatic. EOMI are intact. Mouth is well hydrated and without lesions. NECK: Supple. No masses LUNGS: Clear to auscultation. No presence of rhonchi/wheezing/rales. Adequate chest expansion HEART: RRR, normal s1 and s2. ABDOMEN: Soft, nontender, no guarding, no peritoneal signs. There is presence of mildly distention diffusely. BS +. No masses. EXTREMITIES: Without any cyanosis, clubbing, rash, lesions or edema. NEUROLOGIC: AOx3, no focal motor deficit. SKIN: no jaundice, no rashes  Imaging/Labs: as above  I personally reviewed and interpreted the available labs, imaging and endoscopic files.  Impression and Plan: Katrina Castro is a 75 y.o. female past medical history of c. Difficile, coronary artery disease status post CABG, hypertension, GERD, type III achalasia, type 2 diabetes and depression, who presents for follow up of type III achalasia and constipation.  The patient has presented worsening constipation after starting oral iron supplementation.  I explained to her that this is a common side effect of the medication.  She is only taking stool softener daily which may not be enough to improve her bowel movement frequency.  Due to this, I advised her to start taking MiraLAX on a daily basis on top of her current stool softener.  She can uptitrate his medication as needed.  This will also improve her abdominal distention as she would benefit from  increased peristalsis.  In terms of her episodes of regurgitation of food and dysphagia, this is related to her type III achalasia.  I reviewed the records from the esophageal manometry she had performed at [facility] on February 2017 which showed findings consistent with type III achalasia as her IRP was 22 with presence of a spastic esophagus.  She had previously responded to the use of Botox which surprisingly has lasted for several years but for the last year she has presented recurrent and persistent symptoms.  I explained to her that her symptoms of heartburn are likely related to stasis of food and not that much coming from reflux of acid.  I had thorough discussion with the patient regarding the available options for treatment of her condition which include Heller myotomy, peroral endoscopic myotomy, balloon dilation with pneumatic balloon or Botox injection.  I explained to her the benefits and risk of each of the modalities and explained that overall the studies have shown that for the her type of achalasia the best evidence supporting the management with peroral endoscopic myotomy.  The patient understood and would like to discuss this with her daughter before making a decision.  If she decides to pursue POEM, I will refer her to the [facility] advanced endoscopy team to evaluate her, while if she wants to undergo a Heller myotomy she can be referred to [name] surgery.  She is not interested in a balloon dilation given the potential need for a surgical repair if there is a perforation.  Ultimately, she may pursue Botox injection which I could perform, but I explained to her that the effect of this medication may wear off over time.  I also called her daughter upon request of the patient to explain this so they can have a consensual decision regarding the next step in her care.  -Discuss with daughter regarding referral to [facility] for possible POEM procedure (best procedure) vs Heller myotomy  injection with Botox -Continue Nexium 40 mg qday -Start taking Miralax 1 capful every day for one week. If bowel movements do not improve, increase to 1 capful every  12 hours. If after two weeks there is no improvement, increase to 1 capful every 8 hours  All questions were answered.      Harvel Quale, MD Gastroenterology and Hepatology [facility]

## 2020-12-29 NOTE — Progress Notes (Signed)
Maylon Peppers, M.D. Gastroenterology & Hepatology [facility] [address] [place], [place] [address]  Primary Care Physician: Glenda Chroman, MD [place] [id number]  I will communicate my assessment and recommendations to the referring MD via EMR.  Problems: Type III achalasia Constipation due to iron supplementation  History of Present Illness: Katrina Castro is a 75 y.o. female past medical history of c. Difficile, coronary artery disease status post CABG, hypertension, GERD, type III achalasia, type 2 diabetes and depression, who presents for follow up of type III achalasia and constipation.  The patient was last seen on 10/11/2020. At that time, the patient was advised to take Nexium 40 mg before breakfast and to continue chewing thoroughly her food to avoid any issues with dysphagia.  The patient reports that she has been taking iron and vitamin B12 for anemia. Last Hb was 12.1 in 11/23/2020.  She states that even though she has tolerated well her iron, she has noticed that while taking she has presented worsening bloating and has had a bowel every other day which is different from her usual. She does not take any laxative, only stool softener daily. She took Dulcolax last week to have a BM as she had to strain to have bowel movements.  She also reports that for the last week she has presented episodes of regurgitation and discomfort in her mid chest. She states that for the last 6-8 months she has had dysphagia especially when eating bread or meat, which she has avoided to improve her symptoms.  However, she has noticed that more recently even regular soft food causes persistent and recurrent symptoms. She reports having significant choking spells when eating food in general -she is concerned that she may aspirate some of the food.  Occasionally she has some heartburn and belching which she does not think has improved with  the PPI. She gained some lb recently. The patient denies having any nausea, vomiting, fever, chills, hematochezia, melena, hematemesis, abdominal distention, abdominal pain, diarrhea, jaundice, pruritus.  Notably, the patient had a CABG in 04/2020 at [facility] after presenting with exertional anginal symptoms.  These have much more improved after she underwent her surgery.  She is currently on Plavix for this.  Last EGD:06/14/2016, single erosion was found with GE junction.  There was presence of Schatzki's ring at the GE junction which was dilated with a 56 [place] dilator.  There was also spasticity of the esophageal body with spastic lower esophageal sphincter.  This was injected with Botox.  There was presence of a 3 cm hiatal hernia.  Normal stomach and duodenum. Last Colonoscopy:05/01/2019, normal terminal ileum, there was a small polyp in the proximal sigmoid colon which was removed with forceps ().  Past Medical History: Past Medical History:  Diagnosis Date   Arthritis    CAD (coronary artery disease)    a. s/p CABG on 04/27/2020 with LIMA-LAD, seq SVG-PDA-PL, seq left radial-RI-OM   Concussion    2015   Depression    Diabetes ([place])    type 2   Dysphagia    Dysrhythmia    afib   GERD (gastroesophageal reflux disease)    Headache    History of bronchitis    Hypertension    Seasonal allergies    Toenail fungus     Past Surgical History: Past Surgical History:  Procedure Laterality Date   APPENDECTOMY     BACK SURGERY     x2  BOTOX INJECTION N/A 06/06/2016   Procedure: BOTOX INJECTION;  Surgeon: Rogene Houston, MD;  [facility];  Service: Endoscopy;  Laterality: N/A;   CHOLECYSTECTOMY     COLONOSCOPY N/A 11/05/2012   Procedure: COLONOSCOPY;  Surgeon: Rogene Houston, MD;  [facility];  Service: Endoscopy;  Laterality: N/A;  1030   COLONOSCOPY WITH PROPOFOL N/A 05/01/2019   Procedure: COLONOSCOPY WITH PROPOFOL;  Surgeon: Rogene Houston, MD;   [facility];  Service: Endoscopy;  Laterality: N/A;  11:20am   CORONARY ARTERY BYPASS GRAFT N/A 04/27/2020   Procedure: CORONARY ARTERY BYPASS GRAFTING (CABG) TIMES FIVE USING LEFT INTERNAL MAMMARY ARTERY, LEFT HARVESTED RADIAL ARTERY, RIGHT GREATER SAPHENOUS VEIN HARVESTED ENDOSCOPICALLY.;  Surgeon: [name], MD;  Location: [place];  Service: Open Heart Surgery;  Laterality: N/A;   ESOPHAGEAL DILATION N/A 07/06/2015   Procedure: ESOPHAGEAL DILATION;  Surgeon: Rogene Houston, MD;  [facility];  Service: Endoscopy;  Laterality: N/A;   ESOPHAGEAL DILATION N/A 06/06/2016   Procedure: ESOPHAGEAL DILATION;  Surgeon: Rogene Houston, MD;  [facility];  Service: Endoscopy;  Laterality: N/A;   ESOPHAGOGASTRODUODENOSCOPY N/A 02/12/2014   Procedure: ESOPHAGOGASTRODUODENOSCOPY (EGD);  Surgeon: Rogene Houston, MD;  [facility];  Service: Endoscopy;  Laterality: N/A;  150   ESOPHAGOGASTRODUODENOSCOPY N/A 07/06/2015   Procedure: ESOPHAGOGASTRODUODENOSCOPY (EGD);  Surgeon: Rogene Houston, MD;  [facility];  Service: Endoscopy;  Laterality: N/A;  1:25 - moved to 1/18 @ 10:30 - Ann to notify pt   ESOPHAGOGASTRODUODENOSCOPY (EGD) WITH ESOPHAGEAL DILATION N/A 07/25/2012   Procedure: ESOPHAGOGASTRODUODENOSCOPY (EGD) WITH ESOPHAGEAL DILATION;  Surgeon: Rogene Houston, MD;  [facility];  Service: Endoscopy;  Laterality: N/A;  325-rescheduled to [place] notified pt   ESOPHAGOGASTRODUODENOSCOPY (EGD) WITH PROPOFOL N/A 06/06/2016   Procedure: ESOPHAGOGASTRODUODENOSCOPY (EGD) WITH PROPOFOL;  Surgeon: Rogene Houston, MD;  [facility];  Service: Endoscopy;  Laterality: N/A;   EYE SURGERY     cataracts removed   Foot surgeries Bilateral    hammer toes   LEFT HEART CATH AND CORONARY ANGIOGRAPHY N/A 04/26/2020   Procedure: LEFT HEART CATH AND CORONARY ANGIOGRAPHY;  Surgeon: Martinique, Peter M, MD;  Location: [facility];  Service:  Cardiovascular;  Laterality: N/A;   MALONEY DILATION N/A 02/12/2014   Procedure: Venia Minks DILATION;  Surgeon: Rogene Houston, MD;  [facility];  Service: Endoscopy;  Laterality: N/A;   POLYPECTOMY  05/01/2019   Procedure: POLYPECTOMY;  Surgeon: Rogene Houston, MD;  [facility];  Service: Endoscopy;;  colon    RADIAL ARTERY HARVEST Left 04/27/2020   Procedure: LEFT RADIAL ARTERY HARVEST;  Surgeon: [name], MD;  Location: [facility];  Service: Open Heart Surgery;  Laterality: Left;   Right knee arthroscopy     x2   TEE WITHOUT CARDIOVERSION N/A 04/27/2020   Procedure: TRANSESOPHAGEAL ECHOCARDIOGRAM (TEE);  Surgeon: [name], MD;  Location: [place];  Service: Open Heart Surgery;  Laterality: N/A;   TONSILLECTOMY     TOTAL ABDOMINAL HYSTERECTOMY     TOTAL KNEE ARTHROPLASTY Right 04/03/2017   Procedure: RIGHT TOTAL KNEE ARTHROPLASTY;  Surgeon: Latanya Maudlin, MD;  Location[facility];  Service: Orthopedics;  Laterality: Right;    Family History: Family History  Problem Relation Age of Onset   Stroke Father    Diabetes Mother    Diabetes Sister    Colon cancer Neg  Hx     Social History: Social History   Tobacco Use  Smoking Status Never  Smokeless Tobacco Never   Social History   Substance and Sexual Activity  Alcohol Use No   Alcohol/week: 0.0 standard drinks   Comment: socially    Social History   Substance and Sexual Activity  Drug Use No    Allergies: Allergies  Allergen Reactions   Levofloxacin Anaphylaxis and Rash   Cardizem [Diltiazem] Other (See Comments)    Patient states that she could not think, felt that she was in the twilight zone. Arm discomfort.   Sulfa Antibiotics Itching   Diazepam Nausea Only   Lipitor [Atorvastatin] Other (See Comments)    Caused her body to be "out of whack"  - elevated sugar also.    Medications: Current Outpatient Medications  Medication Sig Dispense Refill   acetaminophen (TYLENOL) 325  MG tablet Take 2 tablets (650 mg total) by mouth every 6 (six) hours as needed for mild pain.     albuterol (VENTOLIN HFA) 108 (90 Base) MCG/ACT inhaler Inhale 1 puff into the lungs every 4 (four) hours as needed.     ALPRAZolam (XANAX) 0.5 MG tablet Take 0.25 mg by mouth at bedtime as needed for anxiety.      aspirin EC 81 MG EC tablet Take 1 tablet (81 mg total) by mouth daily. Swallow whole. 30 tablet 11   cetirizine (ZYRTEC) 10 MG tablet Take 10 mg by mouth daily.     clopidogrel (PLAVIX) 75 MG tablet Take 1 tablet (75 mg total) by mouth daily. 90 tablet 3   esomeprazole (NEXIUM) 40 MG capsule Take 1 capsule (40 mg total) by mouth daily before breakfast. 30 capsule 5   ezetimibe (ZETIA) 10 MG tablet Take 1 tablet (10 mg total) by mouth daily. 90 tablet 3   glipiZIDE (GLUCOTROL XL) 5 MG 24 hr tablet Take 5 mg by mouth 2 (two) times daily.     metoprolol (LOPRESSOR) 50 MG tablet Take 50 mg by mouth 2 (two) times daily.      nitroGLYCERIN (NITROSTAT) 0.4 MG SL tablet Place 1 tablet (0.4 mg total) under the tongue every 5 (five) minutes as needed for chest pain. 25 tablet 3   oxybutynin (DITROPAN-XL) 10 MG 24 hr tablet Take 10 mg by mouth daily.     rosuvastatin (CRESTOR) 40 MG tablet Take 1 tablet (40 mg total) by mouth daily. 90 tablet 2   No current facility-administered medications for this visit.    Review of Systems: GENERAL: negative for malaise, night sweats HEENT: No changes in hearing or vision, no nose bleeds or other nasal problems. NECK: Negative for lumps, goiter, pain and significant neck swelling RESPIRATORY: Negative for cough, wheezing CARDIOVASCULAR: Negative for chest pain, leg swelling, palpitations, orthopnea GI: SEE HPI MUSCULOSKELETAL: Negative for joint pain or swelling, back pain, and muscle pain. SKIN: Negative for lesions, rash PSYCH: Negative for sleep disturbance, mood disorder and recent psychosocial stressors. HEMATOLOGY Negative for prolonged bleeding,  bruising easily, and swollen nodes. ENDOCRINE: Negative for cold or heat intolerance, polyuria, polydipsia and goiter. NEURO: negative for tremor, gait imbalance, syncope and seizures. The remainder of the review of systems is noncontributory.   Physical Exam: BP 112/62 (BP Location: Right Arm, Patient Position: Sitting, Cuff Size: Large)   Pulse 76   Temp 98.5 F (36.9 C) (Oral)   Ht 5' 2.5" (1.588 m)   Wt 152 lb 9.6 oz (69.2 kg)   BMI 27.47 kg/m  GENERAL:  The patient is AO x3, in no acute distress. HEENT: Head is normocephalic and atraumatic. EOMI are intact. Mouth is well hydrated and without lesions. NECK: Supple. No masses LUNGS: Clear to auscultation. No presence of rhonchi/wheezing/rales. Adequate chest expansion HEART: RRR, normal s1 and s2. ABDOMEN: Soft, nontender, no guarding, no peritoneal signs. There is presence of mildly distention diffusely. BS +. No masses. EXTREMITIES: Without any cyanosis, clubbing, rash, lesions or edema. NEUROLOGIC: AOx3, no focal motor deficit. SKIN: no jaundice, no rashes  Imaging/Labs: as above  I personally reviewed and interpreted the available labs, imaging and endoscopic files.  Impression and Plan: Katrina Castro is a 75 y.o. female past medical history of c. Difficile, coronary artery disease status post CABG, hypertension, GERD, type III achalasia, type 2 diabetes and depression, who presents for follow up of type III achalasia and constipation.  The patient has presented worsening constipation after starting oral iron supplementation.  I explained to her that this is a common side effect of the medication.  She is only taking stool softener daily which may not be enough to improve her bowel movement frequency.  Due to this, I advised her to start taking MiraLAX on a daily basis on top of her current stool softener.  She can uptitrate his medication as needed.  This will also improve her abdominal distention as she would benefit from  increased peristalsis.  In terms of her episodes of regurgitation of food and dysphagia, this is related to her type III achalasia.  I reviewed the records from the esophageal manometry she had performed at [facility] on February 2017 which showed findings consistent with type III achalasia as her IRP was 22 with presence of a spastic esophagus.  She had previously responded to the use of Botox which surprisingly has lasted for several years but for the last year she has presented recurrent and persistent symptoms.  I explained to her that her symptoms of heartburn are likely related to stasis of food and not that much coming from reflux of acid.  I had thorough discussion with the patient regarding the available options for treatment of her condition which include Heller myotomy, peroral endoscopic myotomy, balloon dilation with pneumatic balloon or Botox injection.  I explained to her the benefits and risk of each of the modalities and explained that overall the studies have shown that for the her type of achalasia the best evidence supporting the management with peroral endoscopic myotomy.  The patient understood and would like to discuss this with her daughter before making a decision.  If she decides to pursue POEM, I will refer her to the [facility] advanced endoscopy team to evaluate her, while if she wants to undergo a Heller myotomy she can be referred to [facility] surgery.  She is not interested in a balloon dilation given the potential need for a surgical repair if there is a perforation.  Ultimately, she may pursue Botox injection which I could perform, but I explained to her that the effect of this medication may wear off over time.  I also called her daughter upon request of the patient to explain this so they can have a consensual decision regarding the next step in her care.  -Discuss with daughter regarding referral to [facility] for possible POEM procedure (best procedure) vs Heller myotomy  injection with Botox -Continue Nexium 40 mg qday -Start taking Miralax 1 capful every day for one week. If bowel movements do not improve, increase to 1 capful every  12 hours. If after two weeks there is no improvement, increase to 1 capful every 8 hours  All questions were answered.      Harvel Quale, MD Gastroenterology and Hepatology [facility]

## 2021-01-02 ENCOUNTER — Telehealth (INDEPENDENT_AMBULATORY_CARE_PROVIDER_SITE_OTHER): Payer: Self-pay

## 2021-01-02 ENCOUNTER — Encounter (INDEPENDENT_AMBULATORY_CARE_PROVIDER_SITE_OTHER): Payer: Self-pay

## 2021-01-02 NOTE — Telephone Encounter (Signed)
Patient called today stating she has made her decision she would like to get "stretched" only for now. Please advise.

## 2021-01-02 NOTE — Telephone Encounter (Signed)
I spoke with the patient today regarding her decision to proceed with Botox and balloon dilation.  I explained to her that we do not perform hydrostatic balloon dilation but will only perform dilation with CRE balloon up to 20 mm and injection of Botox in 4 quadrants.  She would like to proceed with this for now as she is fearful that she may have a bad outcome if she has any type of procedure (including POEM) prior to a year from her CABG.  She has not discussed this with her daughter who asked her to proceed with POEM but she would like to proceed with Botox injection.  She is not interested in a surgical procedure.  Hi Darius Bump,   Can you please schedule a EGD with esophageal dilation and Botox injection? Dx: Achalasia. Room: 3.  Please asked the patient to avoid any solid food 3 days prior to the procedure.  Thanks,  Maylon Peppers, MD Gastroenterology and Hepatology [facility]

## 2021-01-03 ENCOUNTER — Other Ambulatory Visit (INDEPENDENT_AMBULATORY_CARE_PROVIDER_SITE_OTHER): Payer: Self-pay

## 2021-01-03 DIAGNOSIS — K220 Achalasia of cardia: Secondary | ICD-10-CM

## 2021-01-04 ENCOUNTER — Encounter (INDEPENDENT_AMBULATORY_CARE_PROVIDER_SITE_OTHER): Payer: Self-pay

## 2021-01-05 DIAGNOSIS — E538 Deficiency of other specified B group vitamins: Secondary | ICD-10-CM | POA: Diagnosis not present

## 2021-01-09 NOTE — Patient Instructions (Signed)
Katrina Castro  01/09/2021     '@PREFPERIOPPHARMACY'$ @   Your procedure is scheduled on  01/17/2021.   Report to [facility] at  [email or web address].   Call this number if you have problems the morning of surgery:  [phone]   Remember:  Follow the diet instructions given to you by the office.    Take these medicines the morning of surgery with A SIP OF WATER          xyzal, metoprolol, ditropan, protonix.    Use your inhaler before you come and bring your rescue inhaler with you.    Do not wear jewelry, make-up or nail polish.  Do not wear lotions, powders, or perfumes, or deodorant.  Do not shave 48 hours prior to surgery.  Men may shave face and neck.  Do not bring valuables to the hospital.  [facility] is not responsible for any belongings or valuables.  Contacts, dentures or bridgework may not be worn into surgery.  Leave your suitcase in the car.  After surgery it may be brought to your room.  For patients admitted to the hospital, discharge time will be determined by your treatment team.  Patients discharged the day of surgery will not be allowed to drive home and must  have someone with them for 24 hours.    Special instructions:    DO NOT smoke tobacco or vape for 24 hours before your procedure.   Please read over the following fact sheets that you were given. Anesthesia Post-op Instructions and Care and Recovery After Surgery      Upper Endoscopy, Adult, Care After This sheet gives you information about how to care for yourself after your procedure. Your health care provider may also give you more specific instructions. If you have problems or questions, contact your health careprovider. What can I expect after the procedure? After the procedure, it is common to have: A sore throat. Mild stomach pain or discomfort. Bloating. Nausea. Follow these instructions at home:  Follow instructions from your health care provider about what to eat or drink  after your procedure. Return to your normal activities as told by your health care provider. Ask your health care provider what activities are safe for you. Take over-the-counter and prescription medicines only as told by your health care provider. If you were given a sedative during the procedure, it can affect you for several hours. Do not drive or operate machinery until your health care provider says that it is safe. Keep all follow-up visits as told by your health care provider. This is important. Contact a health care provider if you have: A sore throat that lasts longer than one day. Trouble swallowing. Get help right away if: You vomit blood or your vomit looks like coffee grounds. You have: A fever. Bloody, black, or tarry stools. A severe sore throat or you cannot swallow. Difficulty breathing. Severe pain in your chest or abdomen. Summary After the procedure, it is common to have a sore throat, mild stomach discomfort, bloating, and nausea. If you were given a sedative during the procedure, it can affect you for several hours. Do not drive or operate machinery until your health care provider says that it is safe. Follow instructions from your health care provider about what to eat or drink after your procedure. Return to your normal activities as told by your health care provider. This information is not intended to replace advice given  to you by your health care provider. Make sure you discuss any questions you have with your healthcare provider. Document Revised: 06/02/2019 Document Reviewed: 11/04/2017 Elsevier Patient Education  2022 [place]. OnabotulinumtoxinA injection (Medical Use) What is this medication? ONABOTULINUMTOXINA (o na BOTT you lye num tox in eh) is a neuro-muscular blocker. This medicine is used to treat crossed eyes, eyelid spasms, severe neck muscle spasms, ankle and toe muscle spasms, and elbow, wrist, and finger muscle spasms. It is also used to  treat excessive underarm sweating, to prevent chronic migraine headaches, and to treat loss of bladder control due toneurologic conditions such as multiple sclerosis or spinal cord injury. This medicine may be used for other purposes; ask your health care provider orpharmacist if you have questions. COMMON BRAND NAME(S): Botox What should I tell my care team before I take this medication? They need to know if you have any of these conditions: breathing problems cerebral palsy spasms difficulty urinating heart problems history of surgery where this medicine is going to be used infection at the site where this medicine is going to be used myasthenia gravis or other neurologic disease nerve or muscle disease surgery plans take medicines that treat or prevent blood clots thyroid problems an unusual or allergic reaction to botulinum toxin, albumin, other medicines, foods, dyes, or preservatives pregnant or trying to get pregnant breast-feeding How should I use this medication? This medicine is for injection into a muscle. It is given by a health careprofessional in a hospital or clinic setting. Talk to your pediatrician regarding the use of this medicine in children. While this drug may be prescribed for children as young as 4 years old for selectedconditions, precautions do apply. Overdosage: If you think you have taken too much of this medicine contact apoison control center or emergency room at once. NOTE: This medicine is only for you. Do not share this medicine with others. What if I miss a dose? This does not apply. What may interact with this medication? aminoglycoside antibiotics like gentamicin, neomycin, tobramycin muscle relaxants other botulinum toxin injections This list may not describe all possible interactions. Give your health care provider a list of all the medicines, herbs, non-prescription drugs, or dietary supplements you use. Also tell them if you smoke, drink alcohol,  or use illegaldrugs. Some items may interact with your medicine. What should I watch for while using this medication? Visit your doctor for regular check ups. This medicine will cause weakness in the muscle where it is injected. Tell your doctor if you feel unusually weak in other muscles. Get medical help right awayif you have problems with breathing, swallowing, or talking. This medicine might make your eyelids droop or make you see blurry or double. If you have weak muscles or trouble seeing do not drive a car, use machinery,or do other dangerous activities. This medicine contains albumin from human blood. It may be possible to pass an infection in this medicine, but no cases have been reported. Talk to yourdoctor about the risks and benefits of this medicine. If your activities have been limited by your condition, go back to your regularroutine slowly after treatment with this medicine. What side effects may I notice from receiving this medication? Side effects that you should report to your doctor or health care professionalas soon as possible: allergic reactions like skin rash, itching or hives, swelling of the face, lips, or tongue breathing problems changes in vision chest pain or tightness eye irritation, pain fast, irregular heartbeat infection numbness speech  problems swallowing problems unusual weakness Side effects that usually do not require medical attention (report to yourdoctor or health care professional if they continue or are bothersome): bruising or pain at site where injected drooping eyelid dry eyes or mouth headache muscles aches, pains sensitivity to light tearing This list may not describe all possible side effects. Call your doctor for medical advice about side effects. You may report side effects to FDA at1-800-FDA-1088. Where should I keep my medication? This drug is given in a hospital or clinic and will not be stored at home. NOTE: This sheet is a  summary. It may not cover all possible information. If you have questions about this medicine, talk to your doctor, pharmacist, orhealth care provider.  2022 Elsevier/Gold Standard (2017-12-09 14:21:42) https://www.asge.org/home/for-patients/patient-information/understanding-eso-dilation-updated">  Esophageal Dilatation Esophageal dilatation, also called esophageal dilation, is a procedure to widen or open a blocked or narrowed part of the esophagus. The esophagus is the part of the body that moves food and liquid from the mouth to the stomach. You may need this procedure if: You have a buildup of scar tissue in your esophagus that makes it difficult, painful, or impossible to swallow. This can be caused by gastroesophageal reflux disease (GERD). You have cancer of the esophagus. There is a problem with how food moves through your esophagus. In some cases, you may need this procedure repeated at a later time to dilatethe esophagus gradually. Tell a health care provider about: Any allergies you have. All medicines you are taking, including vitamins, herbs, eye drops, creams, and over-the-counter medicines. Any problems you or family members have had with anesthetic medicines. Any blood disorders you have. Any surgeries you have had. Any medical conditions you have. Any antibiotic medicines you are required to take before dental procedures. Whether you are pregnant or may be pregnant. What are the risks? Generally, this is a safe procedure. However, problems may occur, including: Bleeding due to a tear in the lining of the esophagus. A hole, or perforation, in the esophagus. What happens before the procedure? Ask your health care provider about: Changing or stopping your regular medicines. This is especially important if you are taking diabetes medicines or blood thinners. Taking medicines such as aspirin and ibuprofen. These medicines can thin your blood. Do not take these medicines unless  your health care provider tells you to take them. Taking over-the-counter medicines, vitamins, herbs, and supplements. Follow instructions from your health care provider about eating or drinking restrictions. Plan to have a responsible adult take you home from the hospital or clinic. Plan to have a responsible adult care for you for the time you are told after you leave the hospital or clinic. This is important. What happens during the procedure? You may be given a medicine to help you relax (sedative). A numbing medicine may be sprayed into the back of your throat, or you may gargle the medicine. Your health care provider may perform the dilatation using various surgical instruments, such as: Simple dilators. This instrument is carefully placed in the esophagus to stretch it. Guided wire bougies. This involves using an endoscope to insert a wire into the esophagus. A dilator is passed over this wire to enlarge the esophagus. Then the wire is removed. Balloon dilators. An endoscope with a small balloon is inserted into the esophagus. The balloon is inflated to stretch the esophagus and open it up. The procedure may vary among health care providers and hospitals. What can I expect after the procedure? Your blood pressure, heart  rate, breathing rate, and blood oxygen level will be monitored until you leave the hospital or clinic. Your throat may feel slightly sore and numb. This will get better over time. You will not be allowed to eat or drink until your throat is no longer numb. When you are able to drink, urinate, and sit on the edge of the bed without nausea or dizziness, you may be able to return home. Follow these instructions at home: Take over-the-counter and prescription medicines only as told by your health care provider. If you were given a sedative during the procedure, it can affect you for several hours. Do not drive or operate machinery until your health care provider says that it is  safe. Plan to have a responsible adult care for you for the time you are told. This is important. Follow instructions from your health care provider about any eating or drinking restrictions. Do not use any products that contain nicotine or tobacco, such as cigarettes, e-cigarettes, and chewing tobacco. If you need help quitting, ask your health care provider. Keep all follow-up visits. This is important. Contact a health care provider if: You have a fever. You have pain that is not relieved by medicine. Get help right away if: You have chest pain. You have trouble breathing. You have trouble swallowing. You vomit blood. You have black, tarry, or bloody stools. These symptoms may represent a serious problem that is an emergency. Do not wait to see if the symptoms will go away. Get medical help right away. Call your local emergency services (911 in the [place] Do not drive yourself to the hospital. Summary Esophageal dilatation, also called esophageal dilation, is a procedure to widen or open a blocked or narrowed part of the esophagus. Plan to have a responsible adult take you home from the hospital or clinic. For this procedure, a numbing medicine may be sprayed into the back of your throat, or you may gargle the medicine. Do not drive or operate machinery until your health care provider says that it is safe. This information is not intended to replace advice given to you by your health care provider. Make sure you discuss any questions you have with your healthcare provider. Document Revised: 10/21/2019 Document Reviewed: 10/21/2019 Elsevier Patient Education  [place] After This sheet gives you information about how to care for yourself after your procedure. Your health care provider may also give you more specific instructions. If you have problems or questions, contact your health careprovider. What can I expect after the procedure? After the  procedure, it is common to have: Tiredness. Forgetfulness about what happened after the procedure. Impaired judgment for important decisions. Nausea or vomiting. Some difficulty with balance. Follow these instructions at home: For the time period you were told by your health care provider:     Rest as needed. Do not participate in activities where you could fall or become injured. Do not drive or use machinery. Do not drink alcohol. Do not take sleeping pills or medicines that cause drowsiness. Do not make important decisions or sign legal documents. Do not take care of children on your own. Eating and drinking Follow the diet that is recommended by your health care provider. Drink enough fluid to keep your urine pale yellow. If you vomit: Drink water, juice, or soup when you can drink without vomiting. Make sure you have little or no nausea before eating solid foods. General instructions Have a responsible adult stay with you  for the time you are told. It is important to have someone help care for you until you are awake and alert. Take over-the-counter and prescription medicines only as told by your health care provider. If you have sleep apnea, surgery and certain medicines can increase your risk for breathing problems. Follow instructions from your health care provider about wearing your sleep device: Anytime you are sleeping, including during daytime naps. While taking prescription pain medicines, sleeping medicines, or medicines that make you drowsy. Avoid smoking. Keep all follow-up visits as told by your health care provider. This is important. Contact a health care provider if: You keep feeling nauseous or you keep vomiting. You feel light-headed. You are still sleepy or having trouble with balance after 24 hours. You develop a rash. You have a fever. You have redness or swelling around the IV site. Get help right away if: You have trouble breathing. You have  new-onset confusion at home. Summary For several hours after your procedure, you may feel tired. You may also be forgetful and have poor judgment. Have a responsible adult stay with you for the time you are told. It is important to have someone help care for you until you are awake and alert. Rest as told. Do not drive or operate machinery. Do not drink alcohol or take sleeping pills. Get help right away if you have trouble breathing, or if you suddenly become confused. This information is not intended to replace advice given to you by your health care provider. Make sure you discuss any questions you have with your healthcare provider. Document Revised: 02/18/2020 Document Reviewed: 05/07/2019 Elsevier Patient Education  2022 [employer].

## 2021-01-10 ENCOUNTER — Encounter (HOSPITAL_COMMUNITY): Payer: Self-pay

## 2021-01-10 ENCOUNTER — Ambulatory Visit (INDEPENDENT_AMBULATORY_CARE_PROVIDER_SITE_OTHER): Payer: Medicare Other | Admitting: Podiatry

## 2021-01-10 ENCOUNTER — Encounter (HOSPITAL_COMMUNITY)
Admission: RE | Admit: 2021-01-10 | Discharge: 2021-01-10 | Disposition: A | Discharge status: Home / Self Care | Payer: Medicare Other | Source: Ambulatory Visit | Attending: Gastroenterology | Admitting: Gastroenterology

## 2021-01-10 ENCOUNTER — Other Ambulatory Visit: Payer: Self-pay

## 2021-01-10 DIAGNOSIS — M2032 Hallux varus (acquired), left foot: Secondary | ICD-10-CM

## 2021-01-10 DIAGNOSIS — M2042 Other hammer toe(s) (acquired), left foot: Secondary | ICD-10-CM

## 2021-01-10 DIAGNOSIS — R52 Pain, unspecified: Secondary | ICD-10-CM | POA: Diagnosis not present

## 2021-01-10 DIAGNOSIS — L84 Corns and callosities: Secondary | ICD-10-CM | POA: Diagnosis not present

## 2021-01-10 DIAGNOSIS — E1140 Type 2 diabetes mellitus with diabetic neuropathy, unspecified: Secondary | ICD-10-CM

## 2021-01-10 NOTE — Patient Instructions (Signed)
Look for urea 40% cream or ointment and apply to the thickened dry skin / calluses. This can be bought over the counter, at a pharmacy or online such as [employer].  

## 2021-01-11 NOTE — Progress Notes (Signed)
  Subjective:  Patient ID: Katrina Castro, female    DOB: 02-09-1946,  MRN: [id number]  Chief Complaint  Patient presents with   Callouses    Painful corn right foot   Diabetes   Peripheral Neuropathy    75 y.o. female returns with the above complaint. History confirmed with patient.  Overall she is doing well.  She inquires about correcting the deformities on the left foot.  On the right foot she has a painful callus on the plantar third toe. Objective:  Physical Exam: warm, good capillary refill, no trophic changes or ulcerative lesions, normal DP and PT pulses.  She does have diffuse loss of protective sensation bilaterally.  Bilaterally there is thickening of all the toenails with elongation, discoloration and subungual debris Left Foot: Hallux varus and medial deviation of the digits.  Healed surgical scars on the medial foot and dorsal second and third toes.  Atrophy of the plantar metatarsal fat pad  Right Foot:  Atrophy of the plantar metatarsal fat pad callus on the plantar third toe  Radiographs: Left foot hallux varus and medial deviation of the second and third MTPJ's with digital IPJ fusions of the second and third toes Assessment:   1. Controlled type 2 diabetes with neuropathy (HCC)   2. Callus of foot   3. Pain      Plan:  Patient was evaluated and treated and all questions answered.    -We again discussed operative treatment of her hallux varus deformity as well as the hammertoes.  I think the best for waiting till she is more medically stable considering her recent heart attack in the winter.  We discussed the postoperative course that would be required for this.  She will return to see me when she is ready to schedule this.  All symptomatic hyperkeratoses were safely debrided with a sterile #15 blade to patient's level of comfort without incident. We discussed preventative and palliative care of these lesions including supportive and accommodative shoegear,  padding, prefabricated and custom molded accommodative orthoses, use of a pumice stone and lotions/creams daily.  Patient educated on diabetes. Discussed proper diabetic foot care and discussed risks and complications of disease. Educated patient in depth on reasons to return to the office immediately should he/she discover anything concerning or new on the feet. All questions answered. Discussed proper shoes as well.     Return if symptoms worsen or fail to improve.

## 2021-01-13 ENCOUNTER — Telehealth: Payer: Self-pay | Admitting: Podiatry

## 2021-01-13 DIAGNOSIS — E538 Deficiency of other specified B group vitamins: Secondary | ICD-10-CM | POA: Diagnosis not present

## 2021-01-13 NOTE — Telephone Encounter (Signed)
Patient calling to request  prescription for Urea cream 40%. Patient states that she can not find it over the counter.

## 2021-01-16 ENCOUNTER — Other Ambulatory Visit (INDEPENDENT_AMBULATORY_CARE_PROVIDER_SITE_OTHER): Payer: Self-pay

## 2021-01-16 MED ORDER — UREA 40 % EX CREA: 1.0000 "application " | TOPICAL_CREAM | Freq: Every day | CUTANEOUS | 2 refills | Status: DC

## 2021-01-16 NOTE — Addendum Note (Signed)
Addended bySherryle Lis, Nayali Talerico R on: 01/16/2021 09:59 AM   Modules accepted: Orders

## 2021-01-16 NOTE — Telephone Encounter (Signed)
Patient is calling back request  prescription for Urea cream 40%. Patient states that she can not find it over the counter.  Pt stated her foot is burning

## 2021-01-17 ENCOUNTER — Encounter (HOSPITAL_COMMUNITY)
Admission: RE | Disposition: A | Discharge status: Home / Self Care | Payer: Self-pay | Source: Home / Self Care | Attending: Gastroenterology

## 2021-01-17 ENCOUNTER — Encounter (HOSPITAL_COMMUNITY): Payer: Self-pay | Admitting: Gastroenterology

## 2021-01-17 ENCOUNTER — Ambulatory Visit (HOSPITAL_COMMUNITY)
Admission: RE | Admit: 2021-01-17 | Discharge: 2021-01-17 | Disposition: A | Discharge status: Home / Self Care | Payer: Medicare Other | Attending: Gastroenterology | Admitting: Gastroenterology

## 2021-01-17 ENCOUNTER — Ambulatory Visit (HOSPITAL_COMMUNITY): Payer: Medicare Other | Admitting: Anesthesiology

## 2021-01-17 ENCOUNTER — Other Ambulatory Visit: Payer: Self-pay

## 2021-01-17 DIAGNOSIS — Z951 Presence of aortocoronary bypass graft: Secondary | ICD-10-CM | POA: Diagnosis not present

## 2021-01-17 DIAGNOSIS — K220 Achalasia of cardia: Secondary | ICD-10-CM

## 2021-01-17 DIAGNOSIS — Z881 Allergy status to other antibiotic agents status: Secondary | ICD-10-CM | POA: Diagnosis not present

## 2021-01-17 DIAGNOSIS — Z882 Allergy status to sulfonamides status: Secondary | ICD-10-CM | POA: Diagnosis not present

## 2021-01-17 DIAGNOSIS — Z7902 Long term (current) use of antithrombotics/antiplatelets: Secondary | ICD-10-CM | POA: Diagnosis not present

## 2021-01-17 DIAGNOSIS — Z7982 Long term (current) use of aspirin: Secondary | ICD-10-CM | POA: Insufficient documentation

## 2021-01-17 DIAGNOSIS — I2510 Atherosclerotic heart disease of native coronary artery without angina pectoris: Secondary | ICD-10-CM | POA: Diagnosis not present

## 2021-01-17 DIAGNOSIS — Z888 Allergy status to other drugs, medicaments and biological substances status: Secondary | ICD-10-CM | POA: Insufficient documentation

## 2021-01-17 DIAGNOSIS — Z8619 Personal history of other infectious and parasitic diseases: Secondary | ICD-10-CM | POA: Insufficient documentation

## 2021-01-17 DIAGNOSIS — K219 Gastro-esophageal reflux disease without esophagitis: Secondary | ICD-10-CM | POA: Insufficient documentation

## 2021-01-17 DIAGNOSIS — Z833 Family history of diabetes mellitus: Secondary | ICD-10-CM | POA: Diagnosis not present

## 2021-01-17 DIAGNOSIS — Z7984 Long term (current) use of oral hypoglycemic drugs: Secondary | ICD-10-CM | POA: Diagnosis not present

## 2021-01-17 DIAGNOSIS — E119 Type 2 diabetes mellitus without complications: Secondary | ICD-10-CM | POA: Insufficient documentation

## 2021-01-17 DIAGNOSIS — I25110 Atherosclerotic heart disease of native coronary artery with unstable angina pectoris: Secondary | ICD-10-CM | POA: Diagnosis not present

## 2021-01-17 DIAGNOSIS — D649 Anemia, unspecified: Secondary | ICD-10-CM | POA: Diagnosis not present

## 2021-01-17 DIAGNOSIS — I10 Essential (primary) hypertension: Secondary | ICD-10-CM | POA: Insufficient documentation

## 2021-01-17 DIAGNOSIS — Z79899 Other long term (current) drug therapy: Secondary | ICD-10-CM | POA: Insufficient documentation

## 2021-01-17 HISTORY — PX: ESOPHAGOGASTRODUODENOSCOPY (EGD) WITH PROPOFOL: SHX5813

## 2021-01-17 HISTORY — PX: BOTOX INJECTION: SHX5754

## 2021-01-17 LAB — GLUCOSE, CAPILLARY: Glucose-Capillary: 173 mg/dL — ABNORMAL HIGH (ref 70–99)

## 2021-01-17 SURGERY — ESOPHAGOGASTRODUODENOSCOPY (EGD) WITH PROPOFOL
Anesthesia: General

## 2021-01-17 MED ORDER — ETOMIDATE 2 MG/ML IV SOLN
INTRAVENOUS | Status: AC
  Filled 2021-01-17: qty 10

## 2021-01-17 MED ORDER — DEXAMETHASONE SODIUM PHOSPHATE 4 MG/ML IJ SOLN
INTRAMUSCULAR | Status: AC
  Filled 2021-01-17: qty 2

## 2021-01-17 MED ORDER — LACTATED RINGERS IV SOLN: INTRAVENOUS | Status: DC

## 2021-01-17 MED ORDER — DEXMEDETOMIDINE (PRECEDEX) IN NS 20 MCG/5ML (4 MCG/ML) IV SYRINGE
PREFILLED_SYRINGE | INTRAVENOUS | Status: AC
  Filled 2021-01-17: qty 5

## 2021-01-17 MED ORDER — FENTANYL CITRATE (PF) 100 MCG/2ML IJ SOLN
INTRAMUSCULAR | Status: AC
  Filled 2021-01-17: qty 2

## 2021-01-17 MED ORDER — PROPOFOL 10 MG/ML IV BOLUS
INTRAVENOUS | Status: AC
  Filled 2021-01-17: qty 60

## 2021-01-17 MED ORDER — PROPOFOL 10 MG/ML IV BOLUS
INTRAVENOUS | Status: AC
  Filled 2021-01-17: qty 20

## 2021-01-17 MED ORDER — ONABOTULINUMTOXINA 100 UNITS IJ SOLR
100.0000 [IU] | Freq: Once | INTRAMUSCULAR | Status: DC
  Filled 2021-01-17: qty 100

## 2021-01-17 MED ORDER — SODIUM CHLORIDE (PF) 0.9 % IJ SOLN
INTRAMUSCULAR | Status: DC | PRN
  Administered 2021-01-17: 4 mL via SUBMUCOSAL

## 2021-01-17 MED ORDER — PROPOFOL 10 MG/ML IV BOLUS
INTRAVENOUS | Status: DC | PRN
  Administered 2021-01-17 (×2): 50 mg via INTRAVENOUS
  Administered 2021-01-17: 20 mg via INTRAVENOUS
  Administered 2021-01-17: 50 mg via INTRAVENOUS

## 2021-01-17 NOTE — Anesthesia Postprocedure Evaluation (Signed)
Anesthesia Post Note  Patient: Annamae Quain Montijo  Procedure(s) Performed: ESOPHAGOGASTRODUODENOSCOPY (EGD) WITH PROPOFOL BOTOX INJECTION  Patient location during evaluation: Short Stay Anesthesia Type: General Level of consciousness: awake and alert Pain management: pain level controlled Vital Signs Assessment: post-procedure vital signs reviewed and stable Respiratory status: spontaneous breathing Cardiovascular status: blood pressure returned to baseline and stable Postop Assessment: no apparent nausea or vomiting Anesthetic complications: no   No notable events documented.   Last Vitals:  Vitals:   01/17/21 0904  BP: (!) 152/73  Pulse: 61  Resp: 15  Temp: 36.6 C  SpO2: 99%    Last Pain:  Vitals:   01/17/21 1124  TempSrc:   PainSc: 0-No pain                 Hitesh Fouche

## 2021-01-17 NOTE — Discharge Instructions (Addendum)
Discharge patient to home (ambulatory).  Resume previous diet.  Consider evaluation by [facility] gastroenterology for POEM. Restart Plavix today.

## 2021-01-17 NOTE — Transfer of Care (Signed)
Immediate Anesthesia Transfer of Care Note  Patient: Katrina Castro  Procedure(s) Performed: ESOPHAGOGASTRODUODENOSCOPY (EGD) WITH PROPOFOL BOTOX INJECTION  Patient Location: Short Stay  Anesthesia Type:General  Level of Consciousness: awake  Airway & Oxygen Therapy: Patient Spontanous Breathing  Post-op Assessment: Report given to RN  Post vital signs: Reviewed  Last Vitals:  Vitals Value Taken Time  BP    Temp    Pulse    Resp    SpO2      Last Pain:  Vitals:   01/17/21 1124  TempSrc:   PainSc: 0-No pain         Complications: No notable events documented.

## 2021-01-17 NOTE — Interval H&P Note (Signed)
History and Physical Interval Note:  01/17/2021 10:59 AM Katrina Castro is a 75 y.o. female past medical history of c. Difficile, coronary artery disease status post CABG, hypertension, GERD, type III achalasia, type 2 diabetes and depression, who presents for follow up of type III achalasia.  Patient reports she is still having some dysphagia with different kind of food and has regurgitation of food.  Denies having any abdominal pain, nausea or vomiting.  Has presented persistent heartburn.  BP (!) 152/73   Pulse 61   Temp 97.9 F (36.6 C) (Oral)   Resp 15   SpO2 99%  GENERAL: The patient is AO x3, in no acute distress. HEENT: Head is normocephalic and atraumatic. EOMI are intact. Mouth is well hydrated and without lesions. NECK: Supple. No masses LUNGS: Clear to auscultation. No presence of rhonchi/wheezing/rales. Adequate chest expansion HEART: RRR, normal s1 and s2. ABDOMEN: Soft, nontender, no guarding, no peritoneal signs, and nondistended. BS +. No masses. EXTREMITIES: Without any cyanosis, clubbing, rash, lesions or edema. NEUROLOGIC: AOx3, no focal motor deficit. SKIN: no jaundice, no rashes   Katrina Castro  has presented today for surgery, with the diagnosis of Achalasia.  The various methods of treatment have been discussed with the patient and family. After consideration of risks, benefits and other options for treatment, the patient has consented to  Procedure(s) with comments: ESOPHAGOGASTRODUODENOSCOPY (EGD) WITH PROPOFOL (N/A) - 10:35 ESOPHAGEAL DILATION (N/A) BOTOX INJECTION (N/A) as a surgical intervention.  The patient's history has been reviewed, patient examined, no change in status, stable for surgery.  I have reviewed the patient's chart and labs.  Questions were answered to the patient's satisfaction.     [name]

## 2021-01-17 NOTE — Op Note (Signed)
[facility] Patient Name: Katrina Castro Procedure Date: 01/17/2021 11:01 AM MRN: [id number] Date of Birth: November 30, 1945 Attending MD: Maylon Peppers ,  CSN: [id number] Age: 75 Admit Type: Outpatient Procedure:                Upper GI endoscopy Indications:              For therapy of achalasia, For botulinum toxin                            injection of achalasia Providers:                Maylon Peppers, Otis Peak B. Sharon Seller, RN, Nelma Rothman, Technician Referring MD:              Medicines:                Monitored Anesthesia Care Complications:            No immediate complications. Estimated Blood Loss:     Estimated blood loss: none. Procedure:                Pre-Anesthesia Assessment:                           - Prior to the procedure, a History and Physical                            was performed, and patient medications, allergies                            and sensitivities were reviewed. The patient's                            tolerance of previous anesthesia was reviewed.                           - The risks and benefits of the procedure and the                            sedation options and risks were discussed with the                            patient. All questions were answered and informed                            consent was obtained.                           - ASA Grade Assessment: III - A patient with severe                            systemic disease.                           After obtaining informed consent, the endoscope was  passed under direct vision. Throughout the                            procedure, the patient's blood pressure, pulse, and                            oxygen saturations were monitored continuously. The                            [id number] scope was introduced through the                            mouth, and advanced to the second part of duodenum.                             The upper GI endoscopy was accomplished without                            difficulty. The patient tolerated the procedure                            well. Scope In: 11:29:57 AM Scope Out: 11:37:07 AM Total Procedure Duration: 0 hours 7 minutes 10 seconds  Findings:      The examined esophagus was normal. There was presence of scant amount of       foam but no resistance upon passage of the scope at the GE junction.       Area was successfully injected with 4 mL botulinum toxin for treatment       of achalasia (1 mL in each quadrant).      The entire examined stomach was normal.      The examined duodenum was normal. Impression:               - Normal esophagus. Injected.                           - Normal stomach.                           - Normal examined duodenum.                           - No specimens collected. Moderate Sedation:      Per Anesthesia Care Recommendation:           - Discharge patient to home (ambulatory).                           - Resume previous diet.                           - Consider evaluation by [facility] gastroenterology for                            POEM. Procedure Code(s):        --- Professional ---  [id number], Esophagogastroduodenoscopy, flexible,                            transoral; diagnostic, including collection of                            specimen(s) by brushing or washing, when performed                            (separate procedure) Diagnosis Code(s):        --- Professional ---                           K22.0, Achalasia of cardia CPT copyright 2019 [employer]. All rights reserved. The codes documented in this report are preliminary and upon coder review may  be revised to meet current compliance requirements. Maylon Peppers, MD Maylon Peppers,  01/17/2021 11:48:07 AM This report has been signed electronically. Number of Addenda: 0

## 2021-01-17 NOTE — Anesthesia Preprocedure Evaluation (Addendum)
Anesthesia Evaluation  Patient identified by MRN, date of birth, ID band Patient awake    Reviewed: Allergy & Precautions, NPO status , Patient's Chart, lab work & pertinent test results, reviewed documented beta blocker date and time   History of Anesthesia Complications Negative for: history of anesthetic complications  Airway Mallampati: II  TM Distance: >3 FB Neck ROM: Full    Dental  (+) Dental Advisory Given, Partial Upper   Pulmonary neg pulmonary ROS,    Pulmonary exam normal breath sounds clear to auscultation       Cardiovascular hypertension, Pt. on medications and Pt. on home beta blockers + angina + CAD and + CABG (04/2020)  Normal cardiovascular exam+ dysrhythmias  Rhythm:Regular Rate:Normal     Neuro/Psych  Headaches, PSYCHIATRIC DISORDERS Depression    GI/Hepatic Neg liver ROS, GERD (gastrparesis)  Medicated,  Endo/Other  diabetes, Well Controlled, Type 2, Oral Hypoglycemic Agents  Renal/GU negative Renal ROS     Musculoskeletal  (+) Arthritis ,   Abdominal   Peds  Hematology   Anesthesia Other Findings   Reproductive/Obstetrics                             Anesthesia Physical Anesthesia Plan  ASA: 3  Anesthesia Plan: General   Post-op Pain Management:    Induction: Intravenous  PONV Risk Score and Plan: 3 and Propofol infusion  Airway Management Planned: Nasal Cannula and Natural Airway  Additional Equipment:   Intra-op Plan:   Post-operative Plan: Possible Post-op intubation/ventilation  Informed Consent: I have reviewed the patients History and Physical, chart, labs and discussed the procedure including the risks, benefits and alternatives for the proposed anesthesia with the patient or authorized representative who has indicated his/her understanding and acceptance.     Dental advisory given  Plan Discussed with: CRNA and Surgeon  Anesthesia Plan  Comments:        Anesthesia Quick Evaluation

## 2021-01-18 NOTE — Telephone Encounter (Signed)
Pt called and asked if we could mail her the urea cream. He pharmacy stated that she would have to pay $60.00. Please advise

## 2021-01-19 ENCOUNTER — Encounter (HOSPITAL_COMMUNITY)
Admission: RE | Admit: 2021-01-19 | Discharge: 2021-01-19 | Disposition: A | Discharge status: Home / Self Care | Payer: Medicare Other | Source: Ambulatory Visit | Attending: Internal Medicine | Admitting: Internal Medicine

## 2021-01-19 ENCOUNTER — Other Ambulatory Visit: Payer: Self-pay

## 2021-01-19 ENCOUNTER — Encounter (HOSPITAL_COMMUNITY): Payer: Self-pay

## 2021-01-19 VITALS — BP 118/70 | HR 62 | Ht 62.5 in | Wt 156.1 lb

## 2021-01-19 DIAGNOSIS — Z79899 Other long term (current) drug therapy: Secondary | ICD-10-CM | POA: Insufficient documentation

## 2021-01-19 DIAGNOSIS — Z951 Presence of aortocoronary bypass graft: Secondary | ICD-10-CM | POA: Diagnosis not present

## 2021-01-19 DIAGNOSIS — Z7982 Long term (current) use of aspirin: Secondary | ICD-10-CM | POA: Insufficient documentation

## 2021-01-19 DIAGNOSIS — Z48812 Encounter for surgical aftercare following surgery on the circulatory system: Secondary | ICD-10-CM | POA: Insufficient documentation

## 2021-01-19 DIAGNOSIS — Z7984 Long term (current) use of oral hypoglycemic drugs: Secondary | ICD-10-CM | POA: Insufficient documentation

## 2021-01-19 LAB — GLUCOSE, CAPILLARY: Glucose-Capillary: 283 mg/dL — ABNORMAL HIGH (ref 70–99)

## 2021-01-19 NOTE — Progress Notes (Signed)
Cardiac Individual Treatment Plan  Patient Details  Name: Katrina Castro MRN: [id number] Date of Birth: 01-Apr-1946 Referring Provider:   Flowsheet Row CARDIAC REHAB PHASE II ORIENTATION from 01/19/2021 in [place]  Referring Provider Dr. Harrington Challenger       Initial Encounter Date:  Flowsheet Row CARDIAC REHAB PHASE II ORIENTATION from 01/19/2021 in [place]  Date 01/19/21       Visit Diagnosis: S/P CABG x 5  Patient's Home Medications on Admission:  Current Outpatient Medications:    acetaminophen (TYLENOL) 325 MG tablet, Take 325 mg by mouth every 6 (six) hours as needed for headache., Disp: , Rfl:    albuterol (VENTOLIN HFA) 108 (90 Base) MCG/ACT inhaler, Inhale 1 puff into the lungs every 4 (four) hours as needed for wheezing or shortness of breath. (Patient not taking: Reported on 01/19/2021), Disp: , Rfl:    ALPRAZolam (XANAX) 0.5 MG tablet, Take 0.25 mg by mouth at bedtime as needed for anxiety or sleep., Disp: , Rfl:    aspirin EC 81 MG EC tablet, Take 1 tablet (81 mg total) by mouth daily. Swallow whole., Disp: 30 tablet, Rfl: 11   Biotin w/ Vitamins C & E (HAIR SKIN & NAILS GUMMIES PO), Take 2 capsules by mouth daily., Disp: , Rfl:    Cholecalciferol (VITAMIN D) 50 MCG (2000 UT) tablet, Take 2,000 Units by mouth daily., Disp: , Rfl:    clopidogrel (PLAVIX) 75 MG tablet, Take 1 tablet (75 mg total) by mouth daily., Disp: 90 tablet, Rfl: 3   cyanocobalamin (,VITAMIN B-12,) 1000 MCG/ML injection, Inject 1 mg into the muscle every 14 (fourteen) days., Disp: , Rfl:    Cyanocobalamin (B-12 COMPLIANCE INJECTION IJ), Inject 1 Dose as directed every 14 (fourteen) days., Disp: , Rfl:    ezetimibe (ZETIA) 10 MG tablet, Take 1 tablet (10 mg total) by mouth daily., Disp: 90 tablet, Rfl: 3   ferrous gluconate (FERGON) 324 MG tablet, Take 324 mg by mouth daily., Disp: , Rfl:    glipiZIDE (GLUCOTROL XL) 5 MG 24 hr tablet, Take 5 mg by mouth 2 (two)  times daily., Disp: , Rfl:    levocetirizine (XYZAL) 5 MG tablet, Take 5 mg by mouth daily., Disp: , Rfl:    Menthol-Methyl Salicylate (MUSCLE RUB EX), Apply 1 application topically daily as needed (muscle pain). [place] gel, Disp: , Rfl:    metoprolol (LOPRESSOR) 50 MG tablet, Take 50 mg by mouth 2 (two) times daily. , Disp: , Rfl:    Multiple Vitamins-Minerals (ADULT GUMMY PO), Take 2 capsules by mouth daily., Disp: , Rfl:    nitroGLYCERIN (NITROSTAT) 0.4 MG SL tablet, Place 1 tablet (0.4 mg total) under the tongue every 5 (five) minutes as needed for chest pain., Disp: 25 tablet, Rfl: 3   oxybutynin (DITROPAN-XL) 10 MG 24 hr tablet, Take 10 mg by mouth daily., Disp: , Rfl:    pantoprazole (PROTONIX) 40 MG tablet, Take 40 mg by mouth daily., Disp: , Rfl:    rosuvastatin (CRESTOR) 40 MG tablet, Take 1 tablet (40 mg total) by mouth daily., Disp: 90 tablet, Rfl: 2   urea (CARMOL) 40 % CREA, Apply 1 application topically daily., Disp: 120 g, Rfl: 2   vitamin B-12 (CYANOCOBALAMIN) 1000 MCG tablet, Take 1,000 mcg by mouth daily. (Patient not taking: Reported on 01/19/2021), Disp: , Rfl:    Vitamin D, Ergocalciferol, (DRISDOL) 1.25 MG (50000 UNIT) CAPS capsule, Take 50,000 Units by mouth every 14 (fourteen) days., Disp: ,  Rfl:   Past Medical History: Past Medical History:  Diagnosis Date   Arthritis    CAD (coronary artery disease)    a. s/p CABG on 04/27/2020 with LIMA-LAD, seq SVG-PDA-PL, seq left radial-RI-OM   Concussion    2015   Depression    Diabetes ([place])    type 2   Dysphagia    Dysrhythmia    afib   GERD (gastroesophageal reflux disease)    Headache    History of bronchitis    Hypertension    Seasonal allergies    Toenail fungus     Tobacco Use: Social History   Tobacco Use  Smoking Status Never  Smokeless Tobacco Never    Labs: Recent Review Flowsheet Data     Labs for ITP Cardiac and Pulmonary Rehab Latest Ref Rng & Units 04/28/2020 05/12/2020 05/14/2020 08/31/2020  11/23/2020   Cholestrol 0 - 200 mg/dL - - - 160 207(H)   LDLCALC 0 - 99 mg/dL - - - 88 108(H)   HDL >40 mg/dL - - - 41 49   Trlycerides <150 mg/dL - - 131 156(H) 250(H)   Hemoglobin A1c 4.8 - 5.6 % - 6.4(H) - - -   PHART 7.350 - 7.450 7.375 - - - -   PCO2ART 32.0 - 48.0 mmHg 39.1 - - - -   HCO3 20.0 - 28.0 mmol/L 22.9 - - - -   TCO2 22 - 32 mmol/L 24 - - - -   ACIDBASEDEF 0.0 - 2.0 mmol/L 2.0 - - - -   O2SAT % 99.0 - - - -       Capillary Blood Glucose: Lab Results  Component Value Date   GLUCAP 283 (H) 01/19/2021   GLUCAP 173 (H) 01/17/2021   GLUCAP 187 (H) 05/17/2020   GLUCAP 116 (H) 05/17/2020   GLUCAP 135 (H) 05/17/2020    POCT Glucose     Row Name 01/19/21 1325             POCT Blood Glucose   Pre-Exercise 283 mg/dL                Exercise Target Goals: Exercise Program Goal: Individual exercise prescription set using results from initial 6 min walk test and THRR while considering  patient's activity barriers and safety.   Exercise Prescription Goal: Starting with aerobic activity 30 plus minutes a day, 3 days per week for initial exercise prescription. Provide home exercise prescription and guidelines that participant acknowledges understanding prior to discharge.  Activity Barriers & Risk Stratification:  Activity Barriers & Cardiac Risk Stratification - 01/19/21 1320       Activity Barriers & Cardiac Risk Stratification   Activity Barriers Arthritis;Back Problems;Right Knee Replacement;Incisional Pain    Cardiac Risk Stratification High             6 Minute Walk:  6 Minute Walk     Row Name 01/19/21 1552         6 Minute Walk   Phase Initial     Distance 1400 feet     Walk Time 6 minutes     # of Rest Breaks 0     MPH 2.65     METS 2.59     RPE 12     VO2 Peak 9.05     Symptoms No     Resting HR 62 bpm     Resting BP 118/70     Resting Oxygen Saturation  97 %     Exercise  Oxygen Saturation  during 6 min walk 97 %     Max Ex. HR  84 bpm     Max Ex. BP 140/72     2 Minute Post BP 106/64              Oxygen Initial Assessment:   Oxygen Re-Evaluation:   Oxygen Discharge (Final Oxygen Re-Evaluation):   Initial Exercise Prescription:  Initial Exercise Prescription - 01/19/21 1500       Date of Initial Exercise RX and Referring Provider   Date 01/19/21    Referring Provider Dr. Harrington Challenger    Expected Discharge Date 04/14/21      Treadmill   MPH 1.5    Grade 0    Minutes 17      NuStep   Level 1    SPM 80    Minutes 22      Prescription Details   Frequency (times per week) 3    Duration Progress to 30 minutes of continuous aerobic without signs/symptoms of physical distress      Intensity   THRR 40-80% of Max Heartrate 58-116    Ratings of Perceived Exertion 11-13    Perceived Dyspnea 0-4      Resistance Training   Training Prescription Yes    Weight 3 lbs    Reps 10-15             Perform Capillary Blood Glucose checks as needed.  Exercise Prescription Changes:   Exercise Comments:   Exercise Goals and Review:   Exercise Goals     Row Name 01/19/21 1556             Exercise Goals   Increase Physical Activity Yes       Intervention Provide advice, education, support and counseling about physical activity/exercise needs.;Develop an individualized exercise prescription for aerobic and resistive training based on initial evaluation findings, risk stratification, comorbidities and participant's personal goals.       Expected Outcomes Short Term: Attend rehab on a regular basis to increase amount of physical activity.;Long Term: Add in home exercise to make exercise part of routine and to increase amount of physical activity.;Long Term: Exercising regularly at least 3-5 days a week.       Increase Strength and Stamina Yes       Intervention Provide advice, education, support and counseling about physical activity/exercise needs.;Develop an individualized exercise prescription for  aerobic and resistive training based on initial evaluation findings, risk stratification, comorbidities and participant's personal goals.       Expected Outcomes Short Term: Increase workloads from initial exercise prescription for resistance, speed, and METs.;Short Term: Perform resistance training exercises routinely during rehab and add in resistance training at home;Long Term: Improve cardiorespiratory fitness, muscular endurance and strength as measured by increased METs and functional capacity (6MWT)       Able to understand and use rate of perceived exertion (RPE) scale Yes       Intervention Provide education and explanation on how to use RPE scale       Expected Outcomes Short Term: Able to use RPE daily in rehab to express subjective intensity level;Long Term:  Able to use RPE to guide intensity level when exercising independently       Knowledge and understanding of Target Heart Rate Range (THRR) Yes       Intervention Provide education and explanation of THRR including how the numbers were predicted and where they are located for reference  Expected Outcomes Short Term: Able to state/look up THRR;Long Term: Able to use THRR to govern intensity when exercising independently;Short Term: Able to use daily as guideline for intensity in rehab       Able to check pulse independently Yes       Intervention Provide education and demonstration on how to check pulse in carotid and radial arteries.;Review the importance of being able to check your own pulse for safety during independent exercise       Expected Outcomes Short Term: Able to explain why pulse checking is important during independent exercise;Long Term: Able to check pulse independently and accurately       Understanding of Exercise Prescription Yes       Intervention Provide education, explanation, and written materials on patient's individual exercise prescription       Expected Outcomes Short Term: Able to explain program exercise  prescription;Long Term: Able to explain home exercise prescription to exercise independently                Exercise Goals Re-Evaluation :    Discharge Exercise Prescription (Final Exercise Prescription Changes):   Nutrition:  Target Goals: Understanding of nutrition guidelines, daily intake of sodium '1500mg'$ , cholesterol '200mg'$ , calories 30% from fat and 7% or less from saturated fats, daily to have 5 or more servings of fruits and vegetables.  Biometrics:  Pre Biometrics - 01/19/21 1557       Pre Biometrics   Height 5' 2.5" (1.588 m)    Weight 156 lb 1.4 oz (70.8 kg)    Waist Circumference 43.5 inches    Hip Circumference 40.5 inches    Waist to Hip Ratio 1.07 %    BMI (Calculated) 28.08    Triceps Skinfold 28 mm    % Body Fat 43.1 %    Grip Strength 21.1 kg    Flexibility 0 in    Single Leg Stand 2.56 seconds              Nutrition Therapy Plan and Nutrition Goals:   Nutrition Assessments:  Nutrition Assessments - 01/19/21 1317       MEDFICTS Scores   Pre Score 10            MEDIFICTS Score Key: ?70 Need to make dietary changes  40-70 Heart Healthy Diet ? 40 Therapeutic Level Cholesterol Diet   Picture Your Plate Scores: D34-534 Unhealthy dietary pattern with much room for improvement. 41-50 Dietary pattern unlikely to meet recommendations for good health and room for improvement. 51-60 More healthful dietary pattern, with some room for improvement.  >60 Healthy dietary pattern, although there may be some specific behaviors that could be improved.    Nutrition Goals Re-Evaluation:   Nutrition Goals Discharge (Final Nutrition Goals Re-Evaluation):   Psychosocial: Target Goals: Acknowledge presence or absence of significant depression and/or stress, maximize coping skills, provide positive support system. Participant is able to verbalize types and ability to use techniques and skills needed for reducing stress and depression.  Initial Review  & Psychosocial Screening:  Initial Psych Review & Screening - 01/19/21 1306       Initial Review   Current issues with Current Sleep Concerns      Family Dynamics   Good Support System? No    Strains Intra-family strains    Comments She does not get along well with her daughter. Her daughter does not help her out and is not there for her when needed.      Barriers  Psychosocial barriers to participate in program The patient should benefit from training in stress management and relaxation.      Screening Interventions   Interventions Encouraged to exercise;To provide support and resources with identified psychosocial needs;Provide feedback about the scores to participant    Expected Outcomes Long Term Goal: Stressors or current issues are controlled or eliminated.;Long Term goal: The participant improves quality of Life and PHQ9 Scores as seen by post scores and/or verbalization of changes             Quality of Life Scores:  Quality of Life - 01/19/21 1308       Quality of Life   Select Quality of Life      Quality of Life Scores   Health/Function Pre 21.59 %    Socioeconomic Pre 26 %    Psych/Spiritual Pre 27.43 %    Family Pre 21 %    GLOBAL Pre 24.05 %            Scores of 19 and below usually indicate a poorer quality of life in these areas.  A difference of  2-3 points is a clinically meaningful difference.  A difference of 2-3 points in the total score of the Quality of Life Index has been associated with significant improvement in overall quality of life, self-image, physical symptoms, and general health in studies assessing change in quality of life.  PHQ-9: Recent Review Flowsheet Data     Depression screen [facility] 2/9 01/19/2021 07/11/2020   Decreased Interest 0 1   Down, Depressed, Hopeless 0 1   PHQ - 2 Score 0 2   Altered sleeping 3 0   Tired, decreased energy 0 0   Change in appetite 3 1   Feeling bad or failure about yourself  0 0   Trouble  concentrating 0 0   Moving slowly or fidgety/restless 0 0   Suicidal thoughts 0 0   PHQ-9 Score 6 3   Difficult doing work/chores Not difficult at all Somewhat difficult      Interpretation of Total Score  Total Score Depression Severity:  1-4 = Minimal depression, 5-9 = Mild depression, 10-14 = Moderate depression, 15-19 = Moderately severe depression, 20-27 = Severe depression   Psychosocial Evaluation and Intervention:  Psychosocial Evaluation - 01/19/21 1544       Psychosocial Evaluation & Interventions   Interventions Stress management education;Relaxation education;Encouraged to exercise with the program and follow exercise prescription    Comments Pt has no identifiable barriers to participating in rehab. She has no identifiable psychosocial issues. She does report problems falling asleep and staying asleep; she is prescribed xanax for this, but reports that she does not think that it helps much. She reports that she does not have a good support system. She states that she does not get along well with her daughter, and she feels like her daughter does not support her when she needs her to. Her goals while in the program are to lose 10-15 lbs and decrease her SOB with activity. She wants to get back to where she can dance again, as that is one of her hobbies. She is eager to start the progam.    Expected Outcomes The patient will continue to not have any identifiable psychosocial issues.    Continue Psychosocial Services  No Follow up required             Psychosocial Re-Evaluation:   Psychosocial Discharge (Final Psychosocial Re-Evaluation):   Vocational Rehabilitation: Provide vocational rehab  assistance to qualifying candidates.   Vocational Rehab Evaluation & Intervention:  Vocational Rehab - 01/19/21 1312       Initial Vocational Rehab Evaluation & Intervention   Assessment shows need for Vocational Rehabilitation No             Education: Education  Goals: Education classes will be provided on a weekly basis, covering required topics. Participant will state understanding/return demonstration of topics presented.  Learning Barriers/Preferences:  Learning Barriers/Preferences - 01/19/21 1312       Learning Barriers/Preferences   Learning Barriers None    Learning Preferences Skilled Demonstration;Individual Instruction             Education Topics: Hypertension, Hypertension Reduction -Define heart disease and high blood pressure. Discus how high blood pressure affects the body and ways to reduce high blood pressure.   Exercise and Your Heart -Discuss why it is important to exercise, the FITT principles of exercise, normal and abnormal responses to exercise, and how to exercise safely.   Angina -Discuss definition of angina, causes of angina, treatment of angina, and how to decrease risk of having angina.   Cardiac Medications -Review what the following cardiac medications are used for, how they affect the body, and side effects that may occur when taking the medications.  Medications include Aspirin, Beta blockers, calcium channel blockers, ACE Inhibitors, angiotensin receptor blockers, diuretics, digoxin, and antihyperlipidemics.   Congestive Heart Failure -Discuss the definition of CHF, how to live with CHF, the signs and symptoms of CHF, and how keep track of weight and sodium intake.   Heart Disease and Intimacy -Discus the effect sexual activity has on the heart, how changes occur during intimacy as we age, and safety during sexual activity.   Smoking Cessation / COPD -Discuss different methods to quit smoking, the health benefits of quitting smoking, and the definition of COPD.   Nutrition I: Fats -Discuss the types of cholesterol, what cholesterol does to the heart, and how cholesterol levels can be controlled.   Nutrition II: Labels -Discuss the different components of food labels and how to read food  label   Heart Parts/Heart Disease and PAD -Discuss the anatomy of the heart, the pathway of blood circulation through the heart, and these are affected by heart disease.   Stress I: Signs and Symptoms -Discuss the causes of stress, how stress may lead to anxiety and depression, and ways to limit stress.   Stress II: Relaxation -Discuss different types of relaxation techniques to limit stress.   Warning Signs of Stroke / TIA -Discuss definition of a stroke, what the signs and symptoms are of a stroke, and how to identify when someone is having stroke.   Knowledge Questionnaire Score:  Knowledge Questionnaire Score - 01/19/21 1309       Knowledge Questionnaire Score   Pre Score 16/24             Core Components/Risk Factors/Patient Goals at Admission:  Personal Goals and Risk Factors at Admission - 01/19/21 1312       Core Components/Risk Factors/Patient Goals on Admission    Weight Management Yes;Weight Loss    Intervention Weight Management: Develop a combined nutrition and exercise program designed to reach desired caloric intake, while maintaining appropriate intake of nutrient and fiber, sodium and fats, and appropriate energy expenditure required for the weight goal.;Weight Management: Provide education and appropriate resources to help participant work on and attain dietary goals.;Weight Management/Obesity: Establish reasonable short term and long term weight goals.;Obesity: Provide education  and appropriate resources to help participant work on and attain dietary goals.    Expected Outcomes Short Term: Continue to assess and modify interventions until short term weight is achieved;Long Term: Adherence to nutrition and physical activity/exercise program aimed toward attainment of established weight goal;Weight Maintenance: Understanding of the daily nutrition guidelines, which includes 25-35% calories from fat, 7% or less cal from saturated fats, less than '200mg'$   cholesterol, less than 1.5gm of sodium, & 5 or more servings of fruits and vegetables daily;Weight Loss: Understanding of general recommendations for a balanced deficit meal plan, which promotes 1-2 lb weight loss per week and includes a negative energy balance of [phone] kcal/d;Understanding recommendations for meals to include 15-35% energy as protein, 25-35% energy from fat, 35-60% energy from carbohydrates, less than '200mg'$  of dietary cholesterol, 20-35 gm of total fiber daily;Understanding of distribution of calorie intake throughout the day with the consumption of 4-5 meals/snacks    Improve shortness of breath with ADL's Yes    Intervention Provide education, individualized exercise plan and daily activity instruction to help decrease symptoms of SOB with activities of daily living.    Expected Outcomes Short Term: Improve cardiorespiratory fitness to achieve a reduction of symptoms when performing ADLs;Long Term: Be able to perform more ADLs without symptoms or delay the onset of symptoms    Diabetes Yes    Intervention Provide education about signs/symptoms and action to take for hypo/hyperglycemia.;Provide education about proper nutrition, including hydration, and aerobic/resistive exercise prescription along with prescribed medications to achieve blood glucose in normal ranges: Fasting glucose 65-99 mg/dL    Expected Outcomes Short Term: Participant verbalizes understanding of the signs/symptoms and immediate care of hyper/hypoglycemia, proper foot care and importance of medication, aerobic/resistive exercise and nutrition plan for blood glucose control.;Long Term: Attainment of HbA1C < 7%.    Hypertension Yes    Intervention Provide education on lifestyle modifcations including regular physical activity/exercise, weight management, moderate sodium restriction and increased consumption of fresh fruit, vegetables, and low fat dairy, alcohol moderation, and smoking cessation.;Monitor prescription  use compliance.    Expected Outcomes Short Term: Continued assessment and intervention until BP is < 140/30m HG in hypertensive participants. < 130/842mHG in hypertensive participants with diabetes, heart failure or chronic kidney disease.;Long Term: Maintenance of blood pressure at goal levels.             Core Components/Risk Factors/Patient Goals Review:    Core Components/Risk Factors/Patient Goals at Discharge (Final Review):    ITP Comments:   Comments: Patient arrived for 1st visit/orientation/education at 1230. Patient was referred to CR by Dr. RoHarrington Challengerue to CABG x5 (Z95.1). During orientation advised patient on arrival and appointment times what to wear, what to do before, during and after exercise. Reviewed attendance and class policy.  Pt is scheduled to return Cardiac Rehab on 01/23/21 at 0815. Pt was advised to come to class 15 minutes before class starts.  Discussed RPE/Dpysnea scales. Patient participated in warm up stretches. Patient was able to complete 6 minute walk test.  Telemetry:NSR. Patient was measured for the equipment. Discussed equipment safety with patient. Took patient pre-anthropometric measurements. Patient finished visit at 1430.

## 2021-01-19 NOTE — Progress Notes (Signed)
Cardiac/Pulmonary Rehab Medication Review by a Pharmacist  Does the patient  feel that his/her medications are working for him/her?  yes  Has the patient been experiencing any side effects to the medications prescribed?  no  Does the patient measure his/her own blood pressure or blood glucose at home?  yes   Does the patient have any problems obtaining medications due to transportation or finances?   no  Understanding of regimen: good Understanding of indications: good Potential of compliance: excellent  Questions asked to Determine Patient Understanding of Medication Regimen:  1. What is the name of the medication?  2. What is the medication used for?  3. When should it be taken?  4. How much should be taken?  5. How will you take it?  6. What side effects should you report?  Understanding Defined as: Excellent: All questions above are correct Good: Questions 1-4 are correct Fair: Questions 1-2 are correct  Poor: 1 or none of the above questions are correct   Pharmacist comments: Patient presents to cardiac rehab today.  We reviewed her medications. Patient concerned with elevated BS 280 today. She didn't take her glipizide for several doses. Got it refilled today. She will check BS tomorrow and follow up with MD if doesn't trend down. Patient has apple watch with HR monitor. She does check her BS and BP daily. She is active and loves to dance. Very active and ready to get back into a normal day.  Sometimes doesn't take her clopidogrel because it makes her cold. I reemphasized importance of staying compliant.  Otherwise, she is tolerating her current regimen.  Thanks for the opportunity to participate in the care of this patient,  Isac Sarna, BS [place], [place] Pharmacist Pager [phone] 01/19/2021 1:49 PM

## 2021-01-23 ENCOUNTER — Encounter (HOSPITAL_COMMUNITY)
Admission: RE | Admit: 2021-01-23 | Discharge: 2021-01-23 | Disposition: A | Discharge status: Home / Self Care | Payer: Medicare Other | Source: Ambulatory Visit | Attending: Internal Medicine | Admitting: Internal Medicine

## 2021-01-23 ENCOUNTER — Other Ambulatory Visit: Payer: Self-pay

## 2021-01-23 DIAGNOSIS — Z7982 Long term (current) use of aspirin: Secondary | ICD-10-CM | POA: Diagnosis not present

## 2021-01-23 DIAGNOSIS — Z79899 Other long term (current) drug therapy: Secondary | ICD-10-CM | POA: Diagnosis not present

## 2021-01-23 DIAGNOSIS — Z48812 Encounter for surgical aftercare following surgery on the circulatory system: Secondary | ICD-10-CM | POA: Diagnosis not present

## 2021-01-23 DIAGNOSIS — Z951 Presence of aortocoronary bypass graft: Secondary | ICD-10-CM | POA: Diagnosis not present

## 2021-01-23 DIAGNOSIS — Z7984 Long term (current) use of oral hypoglycemic drugs: Secondary | ICD-10-CM | POA: Diagnosis not present

## 2021-01-23 LAB — GLUCOSE, CAPILLARY: Glucose-Capillary: 219 mg/dL — ABNORMAL HIGH (ref 70–99)

## 2021-01-23 NOTE — Progress Notes (Signed)
Daily Session Note  Patient Details  Name: Katrina Castro MRN: [id number] Date of Birth: March 21, 1946 Referring Provider:   Flowsheet Row CARDIAC REHAB PHASE II ORIENTATION from 01/19/2021 in [place]  Referring Provider Dr. Harrington Challenger       Encounter Date: 01/23/2021  Check In:  Session Check In - 01/23/21 0815       Check-In   Supervising physician immediately available to respond to emergencies [facility] MD immediately available    Physician(s) Dr. Johnsie Cancel    Location [facility]    Staff Present Hoy Register, MS, ACSM-CEP, Exercise Physiologist;Debra Wynetta Emery, RN, BSN    Virtual Visit No    Medication changes reported     No    Fall or balance concerns reported    No    Tobacco Cessation No Change    Warm-up and Cool-down Performed as group-led instruction    Resistance Training Performed Yes    VAD Patient? No    PAD/SET Patient? No      Pain Assessment   Currently in Pain? No/denies    Pain Score 0-No pain    Multiple Pain Sites No             Capillary Blood Glucose: Results for orders placed or performed during the hospital encounter of 01/23/21 (from the past 24 hour(s))  Glucose, capillary     Status: Abnormal   Collection Time: 01/23/21  8:11 AM  Result Value Ref Range   Glucose-Capillary 219 (H) 70 - 99 mg/dL      Social History   Tobacco Use  Smoking Status Never  Smokeless Tobacco Never    Goals Met:  Independence with exercise equipment Exercise tolerated well No report of cardiac concerns or symptoms Strength training completed today  Goals Unmet:  Not Applicable  Comments: checkout time is 0915   Dr. Kathie Dike is Medical Director for [facility].

## 2021-01-24 NOTE — Progress Notes (Signed)
Cardiology Office Note   Date:  01/25/2021   ID:  Katrina Castro, DOB Dec 21, 1945, MRN [id number]  PCP:  Glenda Chroman, MD  Cardiologist:   Dorris Carnes, MD    F/U of CAD    History of Present Illness: Katrina Castro is a 75 y.o. female with a history of CAD (s/p CAVG in Nov 2021 (LIMA to LAD; SVG to PDA, PLSA; L radial to RI, OM1), HTN, HL and Type II DM   Post CABG had post op afib, UTI, diarrhea.  Hosp in Nov 2021 with C diff colitis and ? Pancreatitis    The pt has also had chronic chest pains    I saw the pt in June 2022   At that time she had diffuse complaints   R sided CP    Pain worse with motion    NOted some SOB   Notes some with moving   She was tender on palpitation at the time     I recomm blood work ALl was negative  Since seen she says she still hurts in her.  Worse when she breaths   Also says she just feels like she hasa no energy  Gives out  Also says her weight went up 3 lbs in a few days    Eats light popcorn     Current Meds  Medication Sig   acetaminophen (TYLENOL) 325 MG tablet Take 325 mg by mouth every 6 (six) hours as needed for headache.   albuterol (VENTOLIN HFA) 108 (90 Base) MCG/ACT inhaler Inhale 1 puff into the lungs every 4 (four) hours as needed for wheezing or shortness of breath.   ALPRAZolam (XANAX) 0.5 MG tablet Take 0.25 mg by mouth at bedtime as needed for anxiety or sleep.   aspirin EC 81 MG EC tablet Take 1 tablet (81 mg total) by mouth daily. Swallow whole.   Biotin w/ Vitamins C & E (HAIR SKIN & NAILS GUMMIES PO) Take 2 capsules by mouth daily.   Cholecalciferol (VITAMIN D) 50 MCG (2000 UT) tablet Take 2,000 Units by mouth daily.   clopidogrel (PLAVIX) 75 MG tablet Take 1 tablet (75 mg total) by mouth daily.   cyanocobalamin (,VITAMIN B-12,) 1000 MCG/ML injection Inject 1 mg into the muscle every 14 (fourteen) days.   Cyanocobalamin (B-12 COMPLIANCE INJECTION IJ) Inject 1 Dose as directed every 14 (fourteen) days.   ezetimibe (ZETIA)  10 MG tablet Take 1 tablet (10 mg total) by mouth daily.   ferrous gluconate (FERGON) 324 MG tablet Take 324 mg by mouth daily.   glipiZIDE (GLUCOTROL XL) 5 MG 24 hr tablet Take 5 mg by mouth 2 (two) times daily.   levocetirizine (XYZAL) 5 MG tablet Take 5 mg by mouth daily.   Menthol-Methyl Salicylate (MUSCLE RUB EX) Apply 1 application topically daily as needed (muscle pain). [place] gel   metoprolol (LOPRESSOR) 50 MG tablet Take 50 mg by mouth 2 (two) times daily.    Multiple Vitamins-Minerals (ADULT GUMMY PO) Take 2 capsules by mouth daily.   nitroGLYCERIN (NITROSTAT) 0.4 MG SL tablet Place 1 tablet (0.4 mg total) under the tongue every 5 (five) minutes as needed for chest pain.   oxybutynin (DITROPAN-XL) 10 MG 24 hr tablet Take 10 mg by mouth daily.   pantoprazole (PROTONIX) 40 MG tablet Take 40 mg by mouth daily.   rosuvastatin (CRESTOR) 40 MG tablet Take 1 tablet (40 mg total) by mouth daily.   urea (CARMOL) 40 % CREA Apply  1 application topically daily.   vitamin B-12 (CYANOCOBALAMIN) 1000 MCG tablet Take 1,000 mcg by mouth daily.   Vitamin D, Ergocalciferol, (DRISDOL) 1.25 MG (50000 UNIT) CAPS capsule Take 50,000 Units by mouth every 14 (fourteen) days.     Allergies:   Levofloxacin, Cardizem [diltiazem], Sulfa antibiotics, Diazepam, and Lipitor [atorvastatin]   Past Medical History:  Diagnosis Date   Arthritis    CAD (coronary artery disease)    a. s/p CABG on 04/27/2020 with LIMA-LAD, seq SVG-PDA-PL, seq left radial-RI-OM   Concussion    2015   Depression    Diabetes ([place])    type 2   Dysphagia    Dysrhythmia    afib   GERD (gastroesophageal reflux disease)    Headache    History of bronchitis    Hypertension    Seasonal allergies    Toenail fungus     Past Surgical History:  Procedure Laterality Date   APPENDECTOMY     BACK SURGERY     x2   BOTOX INJECTION N/A 06/06/2016   Procedure: BOTOX INJECTION;  Surgeon: Rogene Houston, MD;  [facility];   Service: Endoscopy;  Laterality: N/A;   CHOLECYSTECTOMY     COLONOSCOPY N/A 11/05/2012   Procedure: COLONOSCOPY;  Surgeon: Rogene Houston, MD;  [facility];  Service: Endoscopy;  Laterality: N/A;  1030   COLONOSCOPY WITH PROPOFOL N/A 05/01/2019   Procedure: COLONOSCOPY WITH PROPOFOL;  Surgeon: Rogene Houston, MD;  [facility];  Service: Endoscopy;  Laterality: N/A;  11:20am   CORONARY ARTERY BYPASS GRAFT N/A 04/27/2020   Procedure: CORONARY ARTERY BYPASS GRAFTING (CABG) TIMES FIVE USING LEFT INTERNAL MAMMARY ARTERY, LEFT HARVESTED RADIAL ARTERY, RIGHT GREATER SAPHENOUS VEIN HARVESTED ENDOSCOPICALLY.;  Surgeon: [name], MD;  Location: [place];  Service: Open Heart Surgery;  Laterality: N/A;   ESOPHAGEAL DILATION N/A 07/06/2015   Procedure: ESOPHAGEAL DILATION;  Surgeon: Rogene Houston, MD;  [facility];  Service: Endoscopy;  Laterality: N/A;   ESOPHAGEAL DILATION N/A 06/06/2016   Procedure: ESOPHAGEAL DILATION;  Surgeon: Rogene Houston, MD;  [facility];  Service: Endoscopy;  Laterality: N/A;   ESOPHAGOGASTRODUODENOSCOPY N/A 02/12/2014   Procedure: ESOPHAGOGASTRODUODENOSCOPY (EGD);  Surgeon: Rogene Houston, MD;  [facility];  Service: Endoscopy;  Laterality: N/A;  150   ESOPHAGOGASTRODUODENOSCOPY N/A 07/06/2015   Procedure: ESOPHAGOGASTRODUODENOSCOPY (EGD);  Surgeon: Rogene Houston, MD;  [facility];  Service: Endoscopy;  Laterality: N/A;  1:25 - moved to 1/18 @ 10:30 - Ann to notify pt   ESOPHAGOGASTRODUODENOSCOPY (EGD) WITH ESOPHAGEAL DILATION N/A 07/25/2012   Procedure: ESOPHAGOGASTRODUODENOSCOPY (EGD) WITH ESOPHAGEAL DILATION;  Surgeon: Rogene Houston, MD;  [facility];  Service: Endoscopy;  Laterality: N/A;  325-rescheduled to [place] notified pt   ESOPHAGOGASTRODUODENOSCOPY (EGD) WITH PROPOFOL N/A 06/06/2016   Procedure: ESOPHAGOGASTRODUODENOSCOPY (EGD) WITH PROPOFOL;  Surgeon: Rogene Houston, MD;   [facility];  Service: Endoscopy;  Laterality: N/A;   EYE SURGERY     cataracts removed   Foot surgeries Bilateral    hammer toes   LEFT HEART CATH AND CORONARY ANGIOGRAPHY N/A 04/26/2020   Procedure: LEFT HEART CATH AND CORONARY ANGIOGRAPHY;  Surgeon: Martinique, Peter M, MD;  Location: [place] [facility];  Service: Cardiovascular;  Laterality: N/A;   MALONEY DILATION N/A 02/12/2014   Procedure: Venia Minks DILATION;  Surgeon: Rogene Houston, MD;  [facility];  Service: Endoscopy;  Laterality: N/A;   POLYPECTOMY  05/01/2019   Procedure: POLYPECTOMY;  Surgeon: Rogene Houston, MD;  [facility];  Service: Endoscopy;;  colon    RADIAL ARTERY HARVEST Left 04/27/2020   Procedure: LEFT RADIAL ARTERY HARVEST;  Surgeon: [name], MD;  Location: [place];  Service: Open Heart Surgery;  Laterality: Left;   Right knee arthroscopy     x2   TEE WITHOUT CARDIOVERSION N/A 04/27/2020   Procedure: TRANSESOPHAGEAL ECHOCARDIOGRAM (TEE);  Surgeon: [name], MD;  Location: [place];  Service: Open Heart Surgery;  Laterality: N/A;   TONSILLECTOMY     TOTAL ABDOMINAL HYSTERECTOMY     TOTAL KNEE ARTHROPLASTY Right 04/03/2017   Procedure: RIGHT TOTAL KNEE ARTHROPLASTY;  Surgeon: Latanya Maudlin, MD;  Location: [facility];  Service: Orthopedics;  Laterality: Right;     Social History:  The patient  reports that she has never smoked. She has never used smokeless tobacco. She reports that she does not drink alcohol and does not use drugs.   Family History:  The patient's family history includes Diabetes in her mother and sister; Stroke in her father.    ROS:  Please see the history of present illness. All other systems are reviewed and  Negative to the above problem except as noted.    PHYSICAL EXAM: VS:  BP (!) 120/50 (BP Location: Left Arm, Patient Position: Sitting, Cuff Size: Normal)   Pulse 62   Ht 5' 2.5" (1.588 m)   Wt 155 lb (70.3 kg)   SpO2 98%   BMI 27.90  kg/m   GEN: Well nourished, well developed, HEENT: normal  Neck: no JVD, Cardiac: RRR S1, S2   no murmurs No LE edema  Chest   Tender to palpation mid chest   Respiratory:  clear to auscultation bilaterally,  GI: soft, nontender, nondistended, + BS  No hepatomegaly  MS: no deformity Moving all extremities   Skin: warm and dry, no rash Neuro:  Strength and sensation are intact Psych: euthymic mood, full affect   EKG:  EKG is ordered today.    Lipid Panel    Component Value Date/Time   CHOL 207 (H) 11/23/2020 1519   CHOL 217 (H) 03/31/2020 1208   TRIG 250 (H) 11/23/2020 1519   HDL 49 11/23/2020 1519   HDL 38 (L) 03/31/2020 1208   CHOLHDL 4.2 11/23/2020 1519   VLDL 50 (H) 11/23/2020 1519   LDLCALC 108 (H) 11/23/2020 1519   LDLCALC 144 (H) 03/31/2020 1208      Wt Readings from Last 3 Encounters:  01/25/21 155 lb (70.3 kg)  01/19/21 156 lb 1.4 oz (70.8 kg)  01/10/21 151 lb (68.5 kg)      ASSESSMENT AND PLAN:  1 Chest pain  Patient continues to complain of pain    Muscular   I do not think cardiac   Follow  2  Fatigue/dyspnea   Volume looks OK on exam   She just went to cardiac rehab on Monday   Will review with nursing there how she is progressing     Will get echo   Check CBC and TSH  3  CAD  Pt is s/p CABG in Nov 2021  Stay on ASA and Plavix for 1 year since she had an acute event at time   4  HTN  BP is controlled   Cut back on Metoprolol to 25 bid   See if she hs more energy  5   HL  Will get lipids today  7  GI   Esophageal problems Follows in GI    Just had injection of Botox    Follow up TBD   Current medicines are reviewed at length with the patient today.  The patient does not have concerns regarding medicines.  Signed, Dorris Carnes, MD  01/25/2021 11:37 AM    [address], [place], [place]  [address] Phone: [phone]; Fax: [phone]

## 2021-01-25 ENCOUNTER — Ambulatory Visit: Payer: Medicare Other | Admitting: Internal Medicine

## 2021-01-25 ENCOUNTER — Other Ambulatory Visit: Payer: Self-pay

## 2021-01-25 ENCOUNTER — Encounter: Payer: Self-pay | Admitting: Internal Medicine

## 2021-01-25 ENCOUNTER — Other Ambulatory Visit (HOSPITAL_COMMUNITY)
Admission: RE | Admit: 2021-01-25 | Discharge: 2021-01-25 | Disposition: A | Discharge status: Home / Self Care | Payer: Medicare Other | Source: Ambulatory Visit | Attending: Internal Medicine | Admitting: Internal Medicine

## 2021-01-25 ENCOUNTER — Telehealth: Payer: Self-pay | Admitting: Internal Medicine

## 2021-01-25 ENCOUNTER — Encounter (HOSPITAL_COMMUNITY): Payer: Medicare Other

## 2021-01-25 ENCOUNTER — Ambulatory Visit (INDEPENDENT_AMBULATORY_CARE_PROVIDER_SITE_OTHER): Payer: Medicare Other | Admitting: Internal Medicine

## 2021-01-25 VITALS — BP 120/50 | HR 62 | Ht 62.5 in | Wt 155.0 lb

## 2021-01-25 DIAGNOSIS — E785 Hyperlipidemia, unspecified: Secondary | ICD-10-CM | POA: Insufficient documentation

## 2021-01-25 DIAGNOSIS — R0602 Shortness of breath: Secondary | ICD-10-CM

## 2021-01-25 DIAGNOSIS — Z79899 Other long term (current) drug therapy: Secondary | ICD-10-CM

## 2021-01-25 LAB — BASIC METABOLIC PANEL
Anion gap: 4 — ABNORMAL LOW (ref 5–15)
BUN: 13 mg/dL (ref 8–23)
CO2: 28 mmol/L (ref 22–32)
Calcium: 9.2 mg/dL (ref 8.9–10.3)
Chloride: 104 mmol/L (ref 98–111)
Creatinine, Ser: 0.69 mg/dL (ref 0.44–1.00)
GFR, Estimated: >60 mL/min (ref >60)
Glucose, Bld: 293 mg/dL — ABNORMAL HIGH (ref 70–99)
Potassium: 4.2 mmol/L (ref 3.5–5.1)
Sodium: 136 mmol/L (ref 135–145)

## 2021-01-25 LAB — CBC
HCT: 40.9 % (ref 36.0–46.0)
Hemoglobin: 12.5 g/dL (ref 12.0–15.0)
MCH: 26 pg (ref 26.0–34.0)
MCHC: 30.6 g/dL (ref 30.0–36.0)
MCV: 85.2 fL (ref 80.0–100.0)
Platelets: 296 10*3/uL (ref 150–400)
RBC: 4.8 MIL/uL (ref 3.87–5.11)
RDW: 15.6 % — ABNORMAL HIGH (ref 11.5–15.5)
WBC: 9.2 10*3/uL (ref 4.0–10.5)
nRBC: 0 % (ref 0.0–0.2)

## 2021-01-25 LAB — BRAIN NATRIURETIC PEPTIDE: B Natriuretic Peptide: 144 pg/mL — ABNORMAL HIGH (ref 0.0–100.0)

## 2021-01-25 LAB — LIPID PANEL
Cholesterol: 106 mg/dL (ref 0–200)
HDL: 38 mg/dL — ABNORMAL LOW (ref >40)
LDL Cholesterol: 35 mg/dL (ref 0–99)
Total CHOL/HDL Ratio: 2.8 RATIO
Triglycerides: 164 mg/dL — ABNORMAL HIGH (ref <150)
VLDL: 33 mg/dL (ref 0–40)

## 2021-01-25 LAB — TSH: TSH: 1.121 u[IU]/mL (ref 0.350–4.500)

## 2021-01-25 MED ORDER — METOPROLOL TARTRATE 25 MG PO TABS: 25.0000 mg | ORAL_TABLET | Freq: Two times a day (BID) | ORAL | 3 refills | Status: DC

## 2021-01-25 NOTE — Telephone Encounter (Signed)
Left a message on pt VM to give office a call back.

## 2021-01-25 NOTE — Patient Instructions (Addendum)
Medication Instructions:   DECREASE Lopressor to 25 mg twice a day    *If you need a refill on your cardiac medications before your next appointment, please call your pharmacy*   Lab Work:  CBC,BMET,TSH,BNP,Lipids today   If you have labs (blood work) drawn today and your tests are completely normal, you will receive your results only by: [place] (if you have MyChart) OR A paper copy in the mail If you have any lab test that is abnormal or we need to change your treatment, we will call you to review the results.   Testing/Procedures: Your physician has requested that you have an echocardiogram. Echocardiography is a painless test that uses sound waves to create images of your heart. It provides your doctor with information about the size and shape of your heart and how well your heart's chambers and valves are working. This procedure takes approximately one hour. There are no restrictions for this procedure.    Follow-Up: At [facility], you and your health needs are our priority.  As part of our continuing mission to provide you with exceptional heart care, we have created designated Provider Care Teams.  These Care Teams include your primary Cardiologist (physician) and Advanced Practice Providers (APPs -  Physician Assistants and Nurse Practitioners) who all work together to provide you with the care you need, when you need it.  We recommend signing up for the patient portal called "MyChart".  Sign up information is provided on this After Visit Summary.  MyChart is used to connect with patients for Virtual Visits (Telemedicine).  Patients are able to view lab/test results, encounter notes, upcoming appointments, etc.  Non-urgent messages can be sent to your provider as well.   To learn more about what you can do with MyChart, go to [email or web address].    Your next appointment:  To be determined after your echocardiogram

## 2021-01-25 NOTE — Telephone Encounter (Signed)
Spoke to pt who stated she feels as if she's putting on weight d/t her medications. She does not know which medication it could be. Please advise.

## 2021-01-25 NOTE — Telephone Encounter (Signed)
Patient is concerned about her weight gain with meds. She would like Dr or someone to call her to let her know what she should do.

## 2021-01-26 ENCOUNTER — Encounter (HOSPITAL_COMMUNITY): Payer: Self-pay | Admitting: Gastroenterology

## 2021-01-26 ENCOUNTER — Telehealth: Payer: Self-pay | Admitting: Internal Medicine

## 2021-01-26 NOTE — Telephone Encounter (Signed)
Called and notified pt that we are waiting for Dr. Harrington Challenger to review lab results and result to nursing staff. Pt voiced understanding and stated that she was thankful for the call.

## 2021-01-26 NOTE — Telephone Encounter (Signed)
New message   Patient calling to get her lab results from yesterday

## 2021-01-27 ENCOUNTER — Ambulatory Visit: Payer: Medicare Other | Admitting: Internal Medicine

## 2021-01-27 ENCOUNTER — Other Ambulatory Visit: Payer: Self-pay

## 2021-01-27 ENCOUNTER — Encounter (HOSPITAL_COMMUNITY)
Admission: RE | Admit: 2021-01-27 | Discharge: 2021-01-27 | Disposition: A | Discharge status: Home / Self Care | Payer: Medicare Other | Source: Ambulatory Visit | Attending: Internal Medicine | Admitting: Internal Medicine

## 2021-01-27 DIAGNOSIS — Z951 Presence of aortocoronary bypass graft: Secondary | ICD-10-CM | POA: Diagnosis not present

## 2021-01-27 DIAGNOSIS — Z7984 Long term (current) use of oral hypoglycemic drugs: Secondary | ICD-10-CM | POA: Diagnosis not present

## 2021-01-27 DIAGNOSIS — Z7982 Long term (current) use of aspirin: Secondary | ICD-10-CM | POA: Diagnosis not present

## 2021-01-27 DIAGNOSIS — Z79899 Other long term (current) drug therapy: Secondary | ICD-10-CM | POA: Diagnosis not present

## 2021-01-27 DIAGNOSIS — E538 Deficiency of other specified B group vitamins: Secondary | ICD-10-CM | POA: Diagnosis not present

## 2021-01-27 DIAGNOSIS — Z48812 Encounter for surgical aftercare following surgery on the circulatory system: Secondary | ICD-10-CM | POA: Diagnosis not present

## 2021-01-27 MED ORDER — FUROSEMIDE 40 MG PO TABS: ORAL_TABLET | ORAL | 0 refills | Status: DC

## 2021-01-27 MED ORDER — POTASSIUM CHLORIDE ER 10 MEQ PO TBCR: EXTENDED_RELEASE_TABLET | ORAL | 0 refills | Status: DC

## 2021-01-27 NOTE — Telephone Encounter (Signed)
Patient called again to get her lab results from Dr Harrington Challenger

## 2021-01-27 NOTE — Telephone Encounter (Signed)
Fay Records, MD  [employer], Marikay Alar, LPN Electrolytes and kidney function are OK  CBC is OK  Thyroid function is normal  Lipids:  LDL is excellent 35   HDL fair 38    Fluid is up a little   Can try lasix 40 mg with 10 KCL every 3rd day until wt gets back to baseline  Then stop  Check BMET in 4 wks with BNP    01/27/21 Attempt to reach at 327 pm, got answering machine, left message to return call    01/27/21 340 pm , I reached patient on her cell phone and discussed lab results with her. She will take lasix 40 mg and potassium 10 meq every third day until weight is back to baseline. She will repeat bmet and bnp in 4 weeks at [facility] outpatient lab

## 2021-01-27 NOTE — Progress Notes (Signed)
Daily Session Note  Patient Details  Name: JENAE TOMASELLO MRN: [id number] Date of Birth: [birth year] Referring Provider:   Flowsheet Row CARDIAC REHAB PHASE II ORIENTATION from 01/19/2021 in [place]  Referring Provider Dr. Harrington Challenger       Encounter Date: 01/27/2021  Check In:  Session Check In - 01/27/21 0815       Check-In   Supervising physician immediately available to respond to emergencies [facility] MD immediately available    Physician(s) Dr. Harrington Challenger    Location [facility]    Staff Present Hoy Register, MS, ACSM-CEP, Exercise Physiologist;Other    Virtual Visit No    Medication changes reported     Yes    Comments metoprolol reduced to 25 mg bid    Fall or balance concerns reported    No    Tobacco Cessation No Change    Warm-up and Cool-down Performed as group-led instruction    Resistance Training Performed Yes    VAD Patient? No    PAD/SET Patient? No      Pain Assessment   Currently in Pain? No/denies    Pain Score 0-No pain    Multiple Pain Sites No             Capillary Blood Glucose: No results found for this or any previous visit (from the past 24 hour(s)).    Social History   Tobacco Use  Smoking Status Never  Smokeless Tobacco Never    Goals Met:  Independence with exercise equipment Exercise tolerated well No report of cardiac concerns or symptoms Strength training completed today  Goals Unmet:  Not Applicable  Comments: checkout time is 0915   Dr. Kathie Dike is Medical Director for [facility]monary Rehab.

## 2021-01-30 ENCOUNTER — Encounter (HOSPITAL_COMMUNITY)
Admission: RE | Admit: 2021-01-30 | Discharge: 2021-01-30 | Disposition: A | Discharge status: Home / Self Care | Payer: Medicare Other | Source: Ambulatory Visit | Attending: Internal Medicine | Admitting: Internal Medicine

## 2021-01-30 ENCOUNTER — Other Ambulatory Visit: Payer: Self-pay

## 2021-01-30 DIAGNOSIS — I10 Essential (primary) hypertension: Secondary | ICD-10-CM | POA: Diagnosis not present

## 2021-01-30 DIAGNOSIS — Z48812 Encounter for surgical aftercare following surgery on the circulatory system: Secondary | ICD-10-CM | POA: Diagnosis not present

## 2021-01-30 DIAGNOSIS — Z7984 Long term (current) use of oral hypoglycemic drugs: Secondary | ICD-10-CM | POA: Diagnosis not present

## 2021-01-30 DIAGNOSIS — I4891 Unspecified atrial fibrillation: Secondary | ICD-10-CM | POA: Diagnosis not present

## 2021-01-30 DIAGNOSIS — Z951 Presence of aortocoronary bypass graft: Secondary | ICD-10-CM | POA: Diagnosis not present

## 2021-01-30 DIAGNOSIS — D6869 Other thrombophilia: Secondary | ICD-10-CM | POA: Diagnosis not present

## 2021-01-30 DIAGNOSIS — Z7982 Long term (current) use of aspirin: Secondary | ICD-10-CM | POA: Diagnosis not present

## 2021-01-30 DIAGNOSIS — Z79899 Other long term (current) drug therapy: Secondary | ICD-10-CM | POA: Diagnosis not present

## 2021-01-30 DIAGNOSIS — E1165 Type 2 diabetes mellitus with hyperglycemia: Secondary | ICD-10-CM | POA: Diagnosis not present

## 2021-01-30 DIAGNOSIS — Z299 Encounter for prophylactic measures, unspecified: Secondary | ICD-10-CM | POA: Diagnosis not present

## 2021-01-30 LAB — GLUCOSE, CAPILLARY: Glucose-Capillary: 310 mg/dL — ABNORMAL HIGH (ref 70–99)

## 2021-01-30 NOTE — Progress Notes (Signed)
Incomplete Session Note  Patient Details  Name: Katrina Castro MRN: [id number] Date of Birth: [birth year] Referring Provider:   Flowsheet Row CARDIAC REHAB PHASE II ORIENTATION from 01/19/2021 in [place]  Referring Provider Dr. Christen Butter did not complete her rehab session.  Her CBG was 310. Departmental guidelines prohibit exercise when CBG >300.

## 2021-02-01 ENCOUNTER — Other Ambulatory Visit: Payer: Self-pay

## 2021-02-01 ENCOUNTER — Encounter (HOSPITAL_COMMUNITY)
Admission: RE | Admit: 2021-02-01 | Discharge: 2021-02-01 | Disposition: A | Discharge status: Home / Self Care | Payer: Medicare Other | Source: Ambulatory Visit | Attending: Internal Medicine | Admitting: Internal Medicine

## 2021-02-01 DIAGNOSIS — Z7982 Long term (current) use of aspirin: Secondary | ICD-10-CM | POA: Diagnosis not present

## 2021-02-01 DIAGNOSIS — Z951 Presence of aortocoronary bypass graft: Secondary | ICD-10-CM

## 2021-02-01 DIAGNOSIS — Z48812 Encounter for surgical aftercare following surgery on the circulatory system: Secondary | ICD-10-CM | POA: Diagnosis not present

## 2021-02-01 DIAGNOSIS — Z79899 Other long term (current) drug therapy: Secondary | ICD-10-CM | POA: Diagnosis not present

## 2021-02-01 DIAGNOSIS — Z7984 Long term (current) use of oral hypoglycemic drugs: Secondary | ICD-10-CM | POA: Diagnosis not present

## 2021-02-01 NOTE — Progress Notes (Signed)
Daily Session Note  Patient Details  Name: Katrina Castro MRN: [id number] Date of Birth: 02/02/1946 Referring Provider:   Flowsheet Row CARDIAC REHAB PHASE II ORIENTATION from 01/19/2021 in [place]  Referring Provider Dr. Harrington Challenger       Encounter Date: 02/01/2021  Check In:  Session Check In - 02/01/21 0815       Check-In   Supervising physician immediately available to respond to emergencies [facility] MD immediately available    Physician(s) Dr. Harl Bowie    Location [facility]    Staff Present Hoy Register, MS, ACSM-CEP, Exercise Physiologist;Other    Virtual Visit No    Medication changes reported     No    Fall or balance concerns reported    No    Tobacco Cessation No Change    Warm-up and Cool-down Performed as group-led instruction    Resistance Training Performed Yes    VAD Patient? No    PAD/SET Patient? No      Pain Assessment   Currently in Pain? No/denies    Pain Score 0-No pain    Multiple Pain Sites No             Capillary Blood Glucose: No results found for this or any previous visit (from the past 24 hour(s)).    Social History   Tobacco Use  Smoking Status Never  Smokeless Tobacco Never    Goals Met:  Independence with exercise equipment Exercise tolerated well No report of cardiac concerns or symptoms Strength training completed today  Goals Unmet:  Not Applicable  Comments: checkout time is 0915   Dr. Kathie Dike is Medical Director for [facility] Pulmonary Re[facility].

## 2021-02-01 NOTE — Progress Notes (Signed)
Cardiac Individual Treatment Plan  Patient Details  Name: Katrina Castro MRN: [id number] Date of Birth: 1945-11-05 Referring Provider:   Flowsheet Row CARDIAC REHAB PHASE II ORIENTATION from 01/19/2021 in [place]  Referring Provider Dr. Harrington Challenger       Initial Encounter Date:  Flowsheet Row CARDIAC REHAB PHASE II ORIENTATION from 01/19/2021 in [place]  Date 01/19/21       Visit Diagnosis: S/P CABG x 5  Patient's Home Medications on Admission:  Current Outpatient Medications:    acetaminophen (TYLENOL) 325 MG tablet, Take 325 mg by mouth every 6 (six) hours as needed for headache., Disp: , Rfl:    albuterol (VENTOLIN HFA) 108 (90 Base) MCG/ACT inhaler, Inhale 1 puff into the lungs every 4 (four) hours as needed for wheezing or shortness of breath., Disp: , Rfl:    ALPRAZolam (XANAX) 0.5 MG tablet, Take 0.25 mg by mouth at bedtime as needed for anxiety or sleep., Disp: , Rfl:    aspirin EC 81 MG EC tablet, Take 1 tablet (81 mg total) by mouth daily. Swallow whole., Disp: 30 tablet, Rfl: 11   Biotin w/ Vitamins C & E (HAIR SKIN & NAILS GUMMIES PO), Take 2 capsules by mouth daily., Disp: , Rfl:    Cholecalciferol (VITAMIN D) 50 MCG (2000 UT) tablet, Take 2,000 Units by mouth daily., Disp: , Rfl:    clopidogrel (PLAVIX) 75 MG tablet, Take 1 tablet (75 mg total) by mouth daily., Disp: 90 tablet, Rfl: 3   cyanocobalamin (,VITAMIN B-12,) 1000 MCG/ML injection, Inject 1 mg into the muscle every 14 (fourteen) days., Disp: , Rfl:    Cyanocobalamin (B-12 COMPLIANCE INJECTION IJ), Inject 1 Dose as directed every 14 (fourteen) days., Disp: , Rfl:    ezetimibe (ZETIA) 10 MG tablet, Take 1 tablet (10 mg total) by mouth daily., Disp: 90 tablet, Rfl: 3   ferrous gluconate (FERGON) 324 MG tablet, Take 324 mg by mouth daily., Disp: , Rfl:    furosemide (LASIX) 40 MG tablet, Take 40 mg every THIRD day until your weight is back to baseline, Disp: 30 tablet,  Rfl: 0   glipiZIDE (GLUCOTROL XL) 5 MG 24 hr tablet, Take 5 mg by mouth 2 (two) times daily., Disp: , Rfl:    levocetirizine (XYZAL) 5 MG tablet, Take 5 mg by mouth daily., Disp: , Rfl:    Menthol-Methyl Salicylate (MUSCLE RUB EX), Apply 1 application topically daily as needed (muscle pain). [place] gel, Disp: , Rfl:    metoprolol tartrate (LOPRESSOR) 25 MG tablet, Take 1 tablet (25 mg total) by mouth 2 (two) times daily., Disp: 180 tablet, Rfl: 3   Multiple Vitamins-Minerals (ADULT GUMMY PO), Take 2 capsules by mouth daily., Disp: , Rfl:    nitroGLYCERIN (NITROSTAT) 0.4 MG SL tablet, Place 1 tablet (0.4 mg total) under the tongue every 5 (five) minutes as needed for chest pain., Disp: 25 tablet, Rfl: 3   oxybutynin (DITROPAN-XL) 10 MG 24 hr tablet, Take 10 mg by mouth daily., Disp: , Rfl:    pantoprazole (PROTONIX) 40 MG tablet, Take 40 mg by mouth daily., Disp: , Rfl:    potassium chloride (KLOR-CON) 10 MEQ tablet, Take 10 meq every THIRD day until weight is back to baseline. Take with Lasix, Disp: 30 tablet, Rfl: 0   rosuvastatin (CRESTOR) 40 MG tablet, Take 1 tablet (40 mg total) by mouth daily., Disp: 90 tablet, Rfl: 2   urea (CARMOL) 40 % CREA, Apply 1 application topically daily., Disp:  120 g, Rfl: 2   vitamin B-12 (CYANOCOBALAMIN) 1000 MCG tablet, Take 1,000 mcg by mouth daily., Disp: , Rfl:    Vitamin D, Ergocalciferol, (DRISDOL) 1.25 MG (50000 UNIT) CAPS capsule, Take 50,000 Units by mouth every 14 (fourteen) days., Disp: , Rfl:   Past Medical History: Past Medical History:  Diagnosis Date   Arthritis    CAD (coronary artery disease)    a. s/p CABG on 04/27/2020 with LIMA-LAD, seq SVG-PDA-PL, seq left radial-RI-OM   Concussion    2015   Depression    Diabetes ([place])    type 2   Dysphagia    Dysrhythmia    afib   GERD (gastroesophageal reflux disease)    Headache    History of bronchitis    Hypertension    Seasonal allergies    Toenail fungus     Tobacco Use: Social  History   Tobacco Use  Smoking Status Never  Smokeless Tobacco Never    Labs: Recent Review Flowsheet Data     Labs for ITP Cardiac and Pulmonary Rehab Latest Ref Rng & Units 05/12/2020 05/14/2020 08/31/2020 11/23/2020 01/25/2021   Cholestrol 0 - 200 mg/dL - - 160 207(H) 106   LDLCALC 0 - 99 mg/dL - - 88 108(H) 35   HDL >40 mg/dL - - 41 49 38(L)   Trlycerides <150 mg/dL - 131 156(H) 250(H) 164(H)   Hemoglobin A1c 4.8 - 5.6 % 6.4(H) - - - -   PHART 7.350 - 7.450 - - - - -   PCO2ART 32.0 - 48.0 mmHg - - - - -   HCO3 20.0 - 28.0 mmol/L - - - - -   TCO2 22 - 32 mmol/L - - - - -   ACIDBASEDEF 0.0 - 2.0 mmol/L - - - - -   O2SAT % - - - - -       Capillary Blood Glucose: Lab Results  Component Value Date   GLUCAP 310 (H) 01/30/2021   GLUCAP 219 (H) 01/23/2021   GLUCAP 283 (H) 01/19/2021   GLUCAP 173 (H) 01/17/2021   GLUCAP 187 (H) 05/17/2020    POCT Glucose     Row Name 01/19/21 1325             POCT Blood Glucose   Pre-Exercise 283 mg/dL                Exercise Target Goals: Exercise Program Goal: Individual exercise prescription set using results from initial 6 min walk test and THRR while considering  patient's activity barriers and safety.   Exercise Prescription Goal: Starting with aerobic activity 30 plus minutes a day, 3 days per week for initial exercise prescription. Provide home exercise prescription and guidelines that participant acknowledges understanding prior to discharge.  Activity Barriers & Risk Stratification:  Activity Barriers & Cardiac Risk Stratification - 01/19/21 1320       Activity Barriers & Cardiac Risk Stratification   Activity Barriers Arthritis;Back Problems;Right Knee Replacement;Incisional Pain    Cardiac Risk Stratification High             6 Minute Walk:  6 Minute Walk     Row Name 01/19/21 1552         6 Minute Walk   Phase Initial     Distance 1400 feet     Walk Time 6 minutes     # of Rest Breaks 0     MPH  2.65     METS 2.59  RPE 12     VO2 Peak 9.05     Symptoms No     Resting HR 62 bpm     Resting BP 118/70     Resting Oxygen Saturation  97 %     Exercise Oxygen Saturation  during 6 min walk 97 %     Max Ex. HR 84 bpm     Max Ex. BP 140/72     2 Minute Post BP 106/64              Oxygen Initial Assessment:   Oxygen Re-Evaluation:   Oxygen Discharge (Final Oxygen Re-Evaluation):   Initial Exercise Prescription:  Initial Exercise Prescription - 01/19/21 1500       Date of Initial Exercise RX and Referring Provider   Date 01/19/21    Referring Provider Dr. Harrington Challenger    Expected Discharge Date 04/14/21      Treadmill   MPH 1.5    Grade 0    Minutes 17      NuStep   Level 1    SPM 80    Minutes 22      Prescription Details   Frequency (times per week) 3    Duration Progress to 30 minutes of continuous aerobic without signs/symptoms of physical distress      Intensity   THRR 40-80% of Max Heartrate 58-116    Ratings of Perceived Exertion 11-13    Perceived Dyspnea 0-4      Resistance Training   Training Prescription Yes    Weight 3 lbs    Reps 10-15             Perform Capillary Blood Glucose checks as needed.  Exercise Prescription Changes:   Exercise Prescription Changes     Row Name 01/27/21 0815             Response to Exercise   Blood Pressure (Admit) 94/60       Blood Pressure (Exercise) 122/64       Blood Pressure (Exit) 118/78       Heart Rate (Admit) 62 bpm       Heart Rate (Exercise) 81 bpm       Heart Rate (Exit) 71 bpm       Rating of Perceived Exertion (Exercise) 12       Duration Continue with 30 min of aerobic exercise without signs/symptoms of physical distress.       Intensity THRR unchanged               Progression   Progression Continue to progress workloads to maintain intensity without signs/symptoms of physical distress.               Resistance Training   Training Prescription Yes       Weight 3 lbs        Reps 10-15       Time 10 Minutes               Treadmill   MPH 1.7       Grade 0       Minutes 22       METs 2.3               NuStep   Level 1       SPM 56       Minutes 22       METs 1.6  Exercise Comments:   Exercise Goals and Review:   Exercise Goals     Row Name 01/19/21 1556 01/30/21 0956           Exercise Goals   Increase Physical Activity Yes Yes      Intervention Provide advice, education, support and counseling about physical activity/exercise needs.;Develop an individualized exercise prescription for aerobic and resistive training based on initial evaluation findings, risk stratification, comorbidities and participant's personal goals. Provide advice, education, support and counseling about physical activity/exercise needs.;Develop an individualized exercise prescription for aerobic and resistive training based on initial evaluation findings, risk stratification, comorbidities and participant's personal goals.      Expected Outcomes Short Term: Attend rehab on a regular basis to increase amount of physical activity.;Long Term: Add in home exercise to make exercise part of routine and to increase amount of physical activity.;Long Term: Exercising regularly at least 3-5 days a week. Short Term: Attend rehab on a regular basis to increase amount of physical activity.;Long Term: Add in home exercise to make exercise part of routine and to increase amount of physical activity.;Long Term: Exercising regularly at least 3-5 days a week.      Increase Strength and Stamina Yes Yes      Intervention Provide advice, education, support and counseling about physical activity/exercise needs.;Develop an individualized exercise prescription for aerobic and resistive training based on initial evaluation findings, risk stratification, comorbidities and participant's personal goals. Provide advice, education, support and counseling about physical activity/exercise  needs.;Develop an individualized exercise prescription for aerobic and resistive training based on initial evaluation findings, risk stratification, comorbidities and participant's personal goals.      Expected Outcomes Short Term: Increase workloads from initial exercise prescription for resistance, speed, and METs.;Short Term: Perform resistance training exercises routinely during rehab and add in resistance training at home;Long Term: Improve cardiorespiratory fitness, muscular endurance and strength as measured by increased METs and functional capacity (6MWT) Short Term: Increase workloads from initial exercise prescription for resistance, speed, and METs.;Short Term: Perform resistance training exercises routinely during rehab and add in resistance training at home;Long Term: Improve cardiorespiratory fitness, muscular endurance and strength as measured by increased METs and functional capacity (6MWT)      Able to understand and use rate of perceived exertion (RPE) scale Yes Yes      Intervention Provide education and explanation on how to use RPE scale Provide education and explanation on how to use RPE scale      Expected Outcomes Short Term: Able to use RPE daily in rehab to express subjective intensity level;Long Term:  Able to use RPE to guide intensity level when exercising independently Short Term: Able to use RPE daily in rehab to express subjective intensity level;Long Term:  Able to use RPE to guide intensity level when exercising independently      Knowledge and understanding of Target Heart Rate Range (THRR) Yes Yes      Intervention Provide education and explanation of THRR including how the numbers were predicted and where they are located for reference Provide education and explanation of THRR including how the numbers were predicted and where they are located for reference      Expected Outcomes Short Term: Able to state/look up THRR;Long Term: Able to use THRR to govern intensity when  exercising independently;Short Term: Able to use daily as guideline for intensity in rehab Short Term: Able to state/look up THRR;Long Term: Able to use THRR to govern intensity when exercising independently;Short Term: Able to use daily  as guideline for intensity in rehab      Able to check pulse independently Yes Yes      Intervention Provide education and demonstration on how to check pulse in carotid and radial arteries.;Review the importance of being able to check your own pulse for safety during independent exercise Provide education and demonstration on how to check pulse in carotid and radial arteries.;Review the importance of being able to check your own pulse for safety during independent exercise      Expected Outcomes Short Term: Able to explain why pulse checking is important during independent exercise;Long Term: Able to check pulse independently and accurately Short Term: Able to explain why pulse checking is important during independent exercise;Long Term: Able to check pulse independently and accurately      Understanding of Exercise Prescription Yes Yes      Intervention Provide education, explanation, and written materials on patient's individual exercise prescription Provide education, explanation, and written materials on patient's individual exercise prescription      Expected Outcomes Short Term: Able to explain program exercise prescription;Long Term: Able to explain home exercise prescription to exercise independently Short Term: Able to explain program exercise prescription;Long Term: Able to explain home exercise prescription to exercise independently               Exercise Goals Re-Evaluation :  Exercise Goals Re-Evaluation     Row Name 01/30/21 0956             Exercise Goal Re-Evaluation   Exercise Goals Review Increase Physical Activity;Increase Strength and Stamina;Able to understand and use rate of perceived exertion (RPE) scale;Knowledge and understanding of  Target Heart Rate Range (THRR);Able to check pulse independently;Understanding of Exercise Prescription       Comments Pt has completed 3 sessions of cardiac rehab. Her CBG has been running high as of late, and she was unable to exercise today because it was 310 at her arrival. She sees her PCP tomorrow and will talk about these concerns with him. She also reports that she feels like she has no energy, so Dr. Harrington Challenger has cut back on her metoprolol. She currently exercises at 1.60 METs while on the stepper. Will continue to monitor and progress as able.       Expected Outcomes Through exercise at rehab and at home, the patient will meet their stated goals.                 Discharge Exercise Prescription (Final Exercise Prescription Changes):  Exercise Prescription Changes - 01/27/21 0815       Response to Exercise   Blood Pressure (Admit) 94/60    Blood Pressure (Exercise) 122/64    Blood Pressure (Exit) 118/78    Heart Rate (Admit) 62 bpm    Heart Rate (Exercise) 81 bpm    Heart Rate (Exit) 71 bpm    Rating of Perceived Exertion (Exercise) 12    Duration Continue with 30 min of aerobic exercise without signs/symptoms of physical distress.    Intensity THRR unchanged      Progression   Progression Continue to progress workloads to maintain intensity without signs/symptoms of physical distress.      Resistance Training   Training Prescription Yes    Weight 3 lbs    Reps 10-15    Time 10 Minutes      Treadmill   MPH 1.7    Grade 0    Minutes 22    METs 2.3  NuStep   Level 1    SPM 56    Minutes 22    METs 1.6             Nutrition:  Target Goals: Understanding of nutrition guidelines, daily intake of sodium '1500mg'$ , cholesterol '200mg'$ , calories 30% from fat and 7% or less from saturated fats, daily to have 5 or more servings of fruits and vegetables.  Biometrics:  Pre Biometrics - 01/19/21 1557       Pre Biometrics   Height 5' 2.5" (1.588 m)    Weight 70.8  kg    Waist Circumference 43.5 inches    Hip Circumference 40.5 inches    Waist to Hip Ratio 1.07 %    BMI (Calculated) 28.08    Triceps Skinfold 28 mm    % Body Fat 43.1 %    Grip Strength 21.1 kg    Flexibility 0 in    Single Leg Stand 2.56 seconds              Nutrition Therapy Plan and Nutrition Goals:  Nutrition Therapy & Goals - 01/23/21 1011       Personal Nutrition Goals   Comments Patient scored 10 on her diet assessment. We offer 2 educational sessons on heart healthy nutrition with handouts and assistance with RD referral if interested.      Intervention Plan   Intervention Nutrition handout(s) given to patient.             Nutrition Assessments:  Nutrition Assessments - 01/19/21 1317       MEDFICTS Scores   Pre Score 10            MEDIFICTS Score Key: ?70 Need to make dietary changes  40-70 Heart Healthy Diet ? 40 Therapeutic Level Cholesterol Diet   Picture Your Plate Scores: [id number] Unhealthy dietary pattern with much room for improvement. 41-50 Dietary pattern unlikely to meet recommendations for good health and room for improvement. 51-60 More healthful dietary pattern, with some room for improvement.  >60 Healthy dietary pattern, although there may be some specific behaviors that could be improved.    Nutrition Goals Re-Evaluation:   Nutrition Goals Discharge (Final Nutrition Goals Re-Evaluation):   Psychosocial: Target Goals: Acknowledge presence or absence of significant depression and/or stress, maximize coping skills, provide positive support system. Participant is able to verbalize types and ability to use techniques and skills needed for reducing stress and depression.  Initial Review & Psychosocial Screening:  Initial Psych Review & Screening - 01/19/21 1306       Initial Review   Current issues with Current Sleep Concerns      Family Dynamics   Good Support System? No    Strains Intra-family strains    Comments She  does not get along well with her daughter. Her daughter does not help her out and is not there for her when needed.      Barriers   Psychosocial barriers to participate in program The patient should benefit from training in stress management and relaxation.      Screening Interventions   Interventions Encouraged to exercise;To provide support and resources with identified psychosocial needs;Provide feedback about the scores to participant    Expected Outcomes Long Term Goal: Stressors or current issues are controlled or eliminated.;Long Term goal: The participant improves quality of Life and PHQ9 Scores as seen by post scores and/or verbalization of changes             Quality of Life Scores:  Quality of Life - 01/19/21 1308       Quality of Life   Select Quality of Life      Quality of Life Scores   Health/Function Pre 21.59 %    Socioeconomic Pre 26 %    Psych/Spiritual Pre 27.43 %    Family Pre 21 %    GLOBAL Pre 24.05 %            Scores of 19 and below usually indicate a poorer quality of life in these areas.  A difference of  2-3 points is a clinically meaningful difference.  A difference of 2-3 points in the total score of the Quality of Life Index has been associated with significant improvement in overall quality of life, self-image, physical symptoms, and general health in studies assessing change in quality of life.  PHQ-9: Recent Review Flowsheet Data     Depression screen [facility] 2/9 01/19/2021 07/11/2020   Decreased Interest 0 1   Down, Depressed, Hopeless 0 1   PHQ - 2 Score 0 2   Altered sleeping 3 0   Tired, decreased energy 0 0   Change in appetite 3 1   Feeling bad or failure about yourself  0 0   Trouble concentrating 0 0   Moving slowly or fidgety/restless 0 0   Suicidal thoughts 0 0   PHQ-9 Score 6 3   Difficult doing work/chores Not difficult at all Somewhat difficult      Interpretation of Total Score  Total Score Depression Severity:  1-4 =  Minimal depression, 5-9 = Mild depression, 10-14 = Moderate depression, 15-19 = Moderately severe depression, 20-27 = Severe depression   Psychosocial Evaluation and Intervention:  Psychosocial Evaluation - 01/19/21 1544       Psychosocial Evaluation & Interventions   Interventions Stress management education;Relaxation education;Encouraged to exercise with the program and follow exercise prescription    Comments Pt has no identifiable barriers to participating in rehab. She has no identifiable psychosocial issues. She does report problems falling asleep and staying asleep; she is prescribed xanax for this, but reports that she does not think that it helps much. She reports that she does not have a good support system. She states that she does not get along well with her daughter, and she feels like her daughter does not support her when she needs her to. Her goals while in the program are to lose 10-15 lbs and decrease her SOB with activity. She wants to get back to where she can dance again, as that is one of her hobbies. She is eager to start the progam.    Expected Outcomes The patient will continue to not have any identifiable psychosocial issues.    Continue Psychosocial Services  No Follow up required             Psychosocial Re-Evaluation:  Psychosocial Re-Evaluation     [place] Name 01/23/21 1013             Psychosocial Re-Evaluation   Current issues with Current Sleep Concerns       Comments Patient is new to the program starting today. She continues to have no psychosocial barriers identified. She uses alprazolam for sleep which helps most of the time. She demonstrates a very positive outllook and an interest in improving her health. We will continue to monitor her progress.       Expected Outcomes Patient will continue to have no psychosocial barriers identified and her sleep concerns will continue to  be managed with Alprazolam.       Interventions Stress management  education;Encouraged to attend Cardiac Rehabilitation for the exercise;Relaxation education       Continue Psychosocial Services  No Follow up required                Psychosocial Discharge (Final Psychosocial Re-Evaluation):  Psychosocial Re-Evaluation - 01/23/21 1013       Psychosocial Re-Evaluation   Current issues with Current Sleep Concerns    Comments Patient is new to the program starting today. She continues to have no psychosocial barriers identified. She uses alprazolam for sleep which helps most of the time. She demonstrates a very positive outllook and an interest in improving her health. We will continue to monitor her progress.    Expected Outcomes Patient will continue to have no psychosocial barriers identified and her sleep concerns will continue to be managed with Alprazolam.    Interventions Stress management education;Encouraged to attend Cardiac Rehabilitation for the exercise;Relaxation education    Continue Psychosocial Services  No Follow up required             Vocational Rehabilitation: Provide vocational rehab assistance to qualifying candidates.   Vocational Rehab Evaluation & Intervention:  Vocational Rehab - 01/19/21 1312       Initial Vocational Rehab Evaluation & Intervention   Assessment shows need for Vocational Rehabilitation No             Education: Education Goals: Education classes will be provided on a weekly basis, covering required topics. Participant will state understanding/return demonstration of topics presented.  Learning Barriers/Preferences:  Learning Barriers/Preferences - 01/19/21 1312       Learning Barriers/Preferences   Learning Barriers None    Learning Preferences Skilled Demonstration;Individual Instruction             Education Topics: Hypertension, Hypertension Reduction -Define heart disease and high blood pressure. Discus how high blood pressure affects the body and ways to reduce high blood  pressure.   Exercise and Your Heart -Discuss why it is important to exercise, the FITT principles of exercise, normal and abnormal responses to exercise, and how to exercise safely.   Angina -Discuss definition of angina, causes of angina, treatment of angina, and how to decrease risk of having angina.   Cardiac Medications -Review what the following cardiac medications are used for, how they affect the body, and side effects that may occur when taking the medications.  Medications include Aspirin, Beta blockers, calcium channel blockers, ACE Inhibitors, angiotensin receptor blockers, diuretics, digoxin, and antihyperlipidemics.   Congestive Heart Failure -Discuss the definition of CHF, how to live with CHF, the signs and symptoms of CHF, and how keep track of weight and sodium intake.   Heart Disease and Intimacy -Discus the effect sexual activity has on the heart, how changes occur during intimacy as we age, and safety during sexual activity.   Smoking Cessation / COPD -Discuss different methods to quit smoking, the health benefits of quitting smoking, and the definition of COPD.   Nutrition I: Fats -Discuss the types of cholesterol, what cholesterol does to the heart, and how cholesterol levels can be controlled.   Nutrition II: Labels -Discuss the different components of food labels and how to read food label   Heart Parts/Heart Disease and PAD -Discuss the anatomy of the heart, the pathway of blood circulation through the heart, and these are affected by heart disease.   Stress I: Signs and Symptoms -Discuss the causes of stress, how  stress may lead to anxiety and depression, and ways to limit stress.   Stress II: Relaxation -Discuss different types of relaxation techniques to limit stress.   Warning Signs of Stroke / TIA -Discuss definition of a stroke, what the signs and symptoms are of a stroke, and how to identify when someone is having stroke.   Knowledge  Questionnaire Score:  Knowledge Questionnaire Score - 01/19/21 1309       Knowledge Questionnaire Score   Pre Score 16/24             Core Components/Risk Factors/Patient Goals at Admission:  Personal Goals and Risk Factors at Admission - 01/19/21 1312       Core Components/Risk Factors/Patient Goals on Admission    Weight Management Yes;Weight Loss    Intervention Weight Management: Develop a combined nutrition and exercise program designed to reach desired caloric intake, while maintaining appropriate intake of nutrient and fiber, sodium and fats, and appropriate energy expenditure required for the weight goal.;Weight Management: Provide education and appropriate resources to help participant work on and attain dietary goals.;Weight Management/Obesity: Establish reasonable short term and long term weight goals.;Obesity: Provide education and appropriate resources to help participant work on and attain dietary goals.    Expected Outcomes Short Term: Continue to assess and modify interventions until short term weight is achieved;Long Term: Adherence to nutrition and physical activity/exercise program aimed toward attainment of established weight goal;Weight Maintenance: Understanding of the daily nutrition guidelines, which includes 25-35% calories from fat, 7% or less cal from saturated fats, less than '200mg'$  cholesterol, less than 1.5gm of sodium, & 5 or more servings of fruits and vegetables daily;Weight Loss: Understanding of general recommendations for a balanced deficit meal plan, which promotes 1-2 lb weight loss per week and includes a negative energy balance of [phone] kcal/d;Understanding recommendations for meals to include 15-35% energy as protein, 25-35% energy from fat, 35-60% energy from carbohydrates, less than '200mg'$  of dietary cholesterol, 20-35 gm of total fiber daily;Understanding of distribution of calorie intake throughout the day with the consumption of 4-5 meals/snacks     Improve shortness of breath with ADL's Yes    Intervention Provide education, individualized exercise plan and daily activity instruction to help decrease symptoms of SOB with activities of daily living.    Expected Outcomes Short Term: Improve cardiorespiratory fitness to achieve a reduction of symptoms when performing ADLs;Long Term: Be able to perform more ADLs without symptoms or delay the onset of symptoms    Diabetes Yes    Intervention Provide education about signs/symptoms and action to take for hypo/hyperglycemia.;Provide education about proper nutrition, including hydration, and aerobic/resistive exercise prescription along with prescribed medications to achieve blood glucose in normal ranges: Fasting glucose 65-99 mg/dL    Expected Outcomes Short Term: Participant verbalizes understanding of the signs/symptoms and immediate care of hyper/hypoglycemia, proper foot care and importance of medication, aerobic/resistive exercise and nutrition plan for blood glucose control.;Long Term: Attainment of HbA1C < 7%.    Hypertension Yes    Intervention Provide education on lifestyle modifcations including regular physical activity/exercise, weight management, moderate sodium restriction and increased consumption of fresh fruit, vegetables, and low fat dairy, alcohol moderation, and smoking cessation.;Monitor prescription use compliance.    Expected Outcomes Short Term: Continued assessment and intervention until BP is < 140/75m HG in hypertensive participants. < 130/815mHG in hypertensive participants with diabetes, heart failure or chronic kidney disease.;Long Term: Maintenance of blood pressure at goal levels.  Core Components/Risk Factors/Patient Goals Review:   Goals and Risk Factor Review     Row Name 01/23/21 1017             Core Components/Risk Factors/Patient Goals Review   Personal Goals Review Weight Management/Obesity;Diabetes;Other       Review Patient was referred  to CR with [facility]. She has completed 1 sessions. Her DM is managed with Glipizide with her last A1C on file 05/12/21 at 6.4% which has trended down from 7.5%. Her personal goals for the program are to get stronger; exercise more; lose 10-15 lbs; and eat better. We will continue to monitor her progress as she works towards meeting these goals.       Expected Outcomes Patient will complete the program meeting both personal and program goals.                Core Components/Risk Factors/Patient Goals at Discharge (Final Review):   Goals and Risk Factor Review - 01/23/21 1017       Core Components/Risk Factors/Patient Goals Review   Personal Goals Review Weight Management/Obesity;Diabetes;Other    Review Patient was referred to CR with [facility]. She has completed 1 sessions. Her DM is managed with Glipizide with her last A1C on file 05/12/21 at 6.4% which has trended down from 7.5%. Her personal goals for the program are to get stronger; exercise more; lose 10-15 lbs; and eat better. We will continue to monitor her progress as she works towards meeting these goals.    Expected Outcomes Patient will complete the program meeting both personal and program goals.             ITP Comments:   Comments: ITP REVIEW Pt is making expected progress toward Cardiac Rehab goals after completing 4 sessions. Recommend continued exercise, life style modification, education, and increased stamina and strength.

## 2021-02-03 ENCOUNTER — Other Ambulatory Visit: Payer: Self-pay

## 2021-02-03 ENCOUNTER — Encounter (HOSPITAL_COMMUNITY)
Admission: RE | Admit: 2021-02-03 | Discharge: 2021-02-03 | Disposition: A | Discharge status: Home / Self Care | Payer: Medicare Other | Source: Ambulatory Visit | Attending: Internal Medicine | Admitting: Internal Medicine

## 2021-02-03 DIAGNOSIS — Z48812 Encounter for surgical aftercare following surgery on the circulatory system: Secondary | ICD-10-CM | POA: Diagnosis not present

## 2021-02-03 DIAGNOSIS — Z951 Presence of aortocoronary bypass graft: Secondary | ICD-10-CM

## 2021-02-03 DIAGNOSIS — Z7984 Long term (current) use of oral hypoglycemic drugs: Secondary | ICD-10-CM | POA: Diagnosis not present

## 2021-02-03 DIAGNOSIS — Z7982 Long term (current) use of aspirin: Secondary | ICD-10-CM | POA: Diagnosis not present

## 2021-02-03 DIAGNOSIS — Z79899 Other long term (current) drug therapy: Secondary | ICD-10-CM | POA: Diagnosis not present

## 2021-02-03 NOTE — Progress Notes (Signed)
Daily Session Note  Patient Details  Name: Katrina Castro MRN: [id number] Date of Birth: [birth year] Referring Provider:   Flowsheet Row CARDIAC REHAB PHASE II ORIENTATION from 01/19/2021 in [place]  Referring Provider Dr. Harrington Challenger       Encounter Date: 02/03/2021  Check In:  Session Check In - 02/03/21 0815       Check-In   Supervising physician immediately available to respond to emergencies [facility] MD immediately available    Physician(s) Dr. Johnsie Cancel    Location [facility]    Staff Present Hoy Register, MS, ACSM-CEP, Exercise Physiologist;Debra Wynetta Emery, RN, BSN    Virtual Visit No    Medication changes reported     No    Fall or balance concerns reported    No    Tobacco Cessation No Change    Warm-up and Cool-down Performed as group-led instruction    Resistance Training Performed Yes    VAD Patient? No    PAD/SET Patient? No      Pain Assessment   Currently in Pain? No/denies    Pain Score 0-No pain    Multiple Pain Sites No             Capillary Blood Glucose: No results found for this or any previous visit (from the past 24 hour(s)).    Social History   Tobacco Use  Smoking Status Never  Smokeless Tobacco Never    Goals Met:  Independence with exercise equipment Exercise tolerated well No report of cardiac concerns or symptoms Strength training completed today  Goals Unmet:  Not Applicable  Comments: checkout time is 0915   Dr. Kathie Dike is Medical Director for [facility] Re[facility].

## 2021-02-06 ENCOUNTER — Other Ambulatory Visit: Payer: Self-pay

## 2021-02-06 ENCOUNTER — Encounter (HOSPITAL_COMMUNITY)
Admission: RE | Admit: 2021-02-06 | Discharge: 2021-02-06 | Disposition: A | Discharge status: Home / Self Care | Payer: Medicare Other | Source: Ambulatory Visit | Attending: Internal Medicine | Admitting: Internal Medicine

## 2021-02-06 ENCOUNTER — Telehealth: Payer: Self-pay | Admitting: Internal Medicine

## 2021-02-06 DIAGNOSIS — Z7982 Long term (current) use of aspirin: Secondary | ICD-10-CM | POA: Diagnosis not present

## 2021-02-06 DIAGNOSIS — Z79899 Other long term (current) drug therapy: Secondary | ICD-10-CM | POA: Diagnosis not present

## 2021-02-06 DIAGNOSIS — Z7984 Long term (current) use of oral hypoglycemic drugs: Secondary | ICD-10-CM | POA: Diagnosis not present

## 2021-02-06 DIAGNOSIS — Z951 Presence of aortocoronary bypass graft: Secondary | ICD-10-CM | POA: Diagnosis not present

## 2021-02-06 DIAGNOSIS — Z48812 Encounter for surgical aftercare following surgery on the circulatory system: Secondary | ICD-10-CM | POA: Diagnosis not present

## 2021-02-06 NOTE — Progress Notes (Signed)
Daily Session Note  Patient Details  Name: Katrina Castro MRN: [id number] Date of Birth: 1945-08-11 Referring Provider:   Flowsheet Row CARDIAC REHAB PHASE II ORIENTATION from 01/19/2021 in [place]  Referring Provider Dr. Harrington Challenger       Encounter Date: 02/06/2021  Check In:  Session Check In - 02/06/21 0815       Check-In   Supervising physician immediately available to respond to emergencies [facility] MD immediately available    Physician(s) Dr. Johnsie Cancel    Location [facility]    Staff Present Hoy Register, MS, ACSM-CEP, Exercise Physiologist;Other    Virtual Visit No    Medication changes reported     No    Fall or balance concerns reported    No    Tobacco Cessation No Change    Warm-up and Cool-down Performed as group-led instruction    Resistance Training Performed Yes    VAD Patient? No    PAD/SET Patient? No      Pain Assessment   Currently in Pain? No/denies    Pain Score 0-No pain    Multiple Pain Sites No             Capillary Blood Glucose: No results found for this or any previous visit (from the past 24 hour(s)).    Social History   Tobacco Use  Smoking Status Never  Smokeless Tobacco Never    Goals Met:  Independence with exercise equipment Exercise tolerated well No report of cardiac concerns or symptoms Strength training completed today  Goals Unmet:  Not Applicable  Comments: checkout time is 0915   Dr. Kathie Dike is Medical Director for [facility] Pulmonary Re[facility].

## 2021-02-06 NOTE — Telephone Encounter (Signed)
New message     Katrina Castro stopped in today to let you know that her primary care doctor has added Metformin to her medication regimen to see if they can get her diabetes under control.

## 2021-02-06 NOTE — Telephone Encounter (Signed)
FYI for Dr. Harrington Challenger

## 2021-02-07 DIAGNOSIS — I10 Essential (primary) hypertension: Secondary | ICD-10-CM | POA: Diagnosis not present

## 2021-02-07 DIAGNOSIS — Z299 Encounter for prophylactic measures, unspecified: Secondary | ICD-10-CM | POA: Diagnosis not present

## 2021-02-07 DIAGNOSIS — E1165 Type 2 diabetes mellitus with hyperglycemia: Secondary | ICD-10-CM | POA: Diagnosis not present

## 2021-02-08 ENCOUNTER — Encounter (HOSPITAL_COMMUNITY)
Admission: RE | Admit: 2021-02-08 | Discharge: 2021-02-08 | Disposition: A | Discharge status: Home / Self Care | Payer: Medicare Other | Source: Ambulatory Visit | Attending: Internal Medicine | Admitting: Internal Medicine

## 2021-02-08 ENCOUNTER — Other Ambulatory Visit: Payer: Self-pay

## 2021-02-08 DIAGNOSIS — Z79899 Other long term (current) drug therapy: Secondary | ICD-10-CM | POA: Diagnosis not present

## 2021-02-08 DIAGNOSIS — Z951 Presence of aortocoronary bypass graft: Secondary | ICD-10-CM | POA: Diagnosis not present

## 2021-02-08 DIAGNOSIS — Z7984 Long term (current) use of oral hypoglycemic drugs: Secondary | ICD-10-CM | POA: Diagnosis not present

## 2021-02-08 DIAGNOSIS — Z7982 Long term (current) use of aspirin: Secondary | ICD-10-CM | POA: Diagnosis not present

## 2021-02-08 DIAGNOSIS — Z48812 Encounter for surgical aftercare following surgery on the circulatory system: Secondary | ICD-10-CM | POA: Diagnosis not present

## 2021-02-08 NOTE — Progress Notes (Signed)
Daily Session Note  Patient Details  Name: Katrina Castro MRN: [id number] Date of Birth: March 30, 1946 Referring Provider:   Flowsheet Row CARDIAC REHAB PHASE II ORIENTATION from 01/19/2021 in [place]  Referring Provider Dr. Harrington Challenger       Encounter Date: 02/08/2021  Check In:  Session Check In - 02/08/21 0815       Check-In   Supervising physician immediately available to respond to emergencies [facility] MD immediately available    Physician(s) Dr. Harl Bowie    Location [facility]    Staff Present Aundra Dubin, RN, BSN;Other   Benay Pillow   Virtual Visit No    Medication changes reported     No    Fall or balance concerns reported    No    Tobacco Cessation No Change    Warm-up and Cool-down Performed as group-led instruction    Resistance Training Performed Yes    VAD Patient? No    PAD/SET Patient? No      Pain Assessment   Currently in Pain? No/denies    Pain Score 0-No pain    Multiple Pain Sites No             Capillary Blood Glucose: No results found for this or any previous visit (from the past 24 hour(s)).    Social History   Tobacco Use  Smoking Status Never  Smokeless Tobacco Never    Goals Met:  Independence with exercise equipment Exercise tolerated well No report of cardiac concerns or symptoms Strength training completed today  Goals Unmet:  Not Applicable  Comments: Check out 915.   Dr. Kathie Dike is Medical Director for [facility] Pulmonary Re[facility].

## 2021-02-09 ENCOUNTER — Ambulatory Visit: Payer: Medicare Other | Admitting: Internal Medicine

## 2021-02-10 ENCOUNTER — Encounter (HOSPITAL_COMMUNITY): Payer: Medicare Other

## 2021-02-10 DIAGNOSIS — E538 Deficiency of other specified B group vitamins: Secondary | ICD-10-CM | POA: Diagnosis not present

## 2021-02-13 ENCOUNTER — Other Ambulatory Visit: Payer: Self-pay

## 2021-02-13 ENCOUNTER — Encounter (HOSPITAL_COMMUNITY)
Admission: RE | Admit: 2021-02-13 | Discharge: 2021-02-13 | Disposition: A | Discharge status: Home / Self Care | Payer: Medicare Other | Source: Ambulatory Visit | Attending: Internal Medicine | Admitting: Internal Medicine

## 2021-02-13 VITALS — Wt 154.5 lb

## 2021-02-13 DIAGNOSIS — Z48812 Encounter for surgical aftercare following surgery on the circulatory system: Secondary | ICD-10-CM | POA: Diagnosis not present

## 2021-02-13 DIAGNOSIS — Z7982 Long term (current) use of aspirin: Secondary | ICD-10-CM | POA: Diagnosis not present

## 2021-02-13 DIAGNOSIS — Z79899 Other long term (current) drug therapy: Secondary | ICD-10-CM | POA: Diagnosis not present

## 2021-02-13 DIAGNOSIS — Z951 Presence of aortocoronary bypass graft: Secondary | ICD-10-CM

## 2021-02-13 DIAGNOSIS — Z7984 Long term (current) use of oral hypoglycemic drugs: Secondary | ICD-10-CM | POA: Diagnosis not present

## 2021-02-13 NOTE — Progress Notes (Signed)
Daily Session Note  Patient Details  Name: Katrina Castro MRN: [id number] Date of Birth: 06/23/1945 Referring Provider:   Flowsheet Row CARDIAC REHAB PHASE II ORIENTATION from 01/19/2021 in [place]  Referring Provider Dr. Harrington Challenger       Encounter Date: 02/13/2021  Check In:  Session Check In - 02/13/21 0815       Check-In   Supervising physician immediately available to respond to emergencies [facility] MD immediately available    Physician(s) Dr. Domenic Polite    Location [facility]    Staff Present Geanie Cooley, RN;Debra Wynetta Emery, RN, BSN    Virtual Visit No    Medication changes reported     No    Fall or balance concerns reported    No    Tobacco Cessation No Change    Warm-up and Cool-down Performed as group-led instruction    Resistance Training Performed Yes    VAD Patient? No    PAD/SET Patient? No      Pain Assessment   Currently in Pain? No/denies    Pain Score 0-No pain    Multiple Pain Sites No             Capillary Blood Glucose: No results found for this or any previous visit (from the past 24 hour(s)).    Social History   Tobacco Use  Smoking Status Never  Smokeless Tobacco Never    Goals Met:  Independence with exercise equipment Exercise tolerated well No report of concerns or symptoms today Strength training completed today  Goals Unmet:  Not Applicable  Comments: check out @ 9:15   Dr. Kathie Dike is Medical Director for [facility] Pulmonary Rehab.

## 2021-02-15 ENCOUNTER — Encounter (HOSPITAL_COMMUNITY)
Admission: RE | Admit: 2021-02-15 | Discharge: 2021-02-15 | Disposition: A | Discharge status: Home / Self Care | Payer: Medicare Other | Source: Ambulatory Visit | Attending: Internal Medicine | Admitting: Internal Medicine

## 2021-02-15 ENCOUNTER — Other Ambulatory Visit: Payer: Self-pay

## 2021-02-15 DIAGNOSIS — Z951 Presence of aortocoronary bypass graft: Secondary | ICD-10-CM

## 2021-02-15 DIAGNOSIS — Z7982 Long term (current) use of aspirin: Secondary | ICD-10-CM | POA: Diagnosis not present

## 2021-02-15 DIAGNOSIS — Z7984 Long term (current) use of oral hypoglycemic drugs: Secondary | ICD-10-CM | POA: Diagnosis not present

## 2021-02-15 DIAGNOSIS — Z79899 Other long term (current) drug therapy: Secondary | ICD-10-CM | POA: Diagnosis not present

## 2021-02-15 DIAGNOSIS — G459 Transient cerebral ischemic attack, unspecified: Secondary | ICD-10-CM | POA: Diagnosis not present

## 2021-02-15 DIAGNOSIS — I10 Essential (primary) hypertension: Secondary | ICD-10-CM | POA: Diagnosis not present

## 2021-02-15 DIAGNOSIS — Z48812 Encounter for surgical aftercare following surgery on the circulatory system: Secondary | ICD-10-CM | POA: Diagnosis not present

## 2021-02-15 DIAGNOSIS — R519 Headache, unspecified: Secondary | ICD-10-CM | POA: Diagnosis not present

## 2021-02-15 DIAGNOSIS — R0602 Shortness of breath: Secondary | ICD-10-CM | POA: Diagnosis not present

## 2021-02-15 DIAGNOSIS — Z299 Encounter for prophylactic measures, unspecified: Secondary | ICD-10-CM | POA: Diagnosis not present

## 2021-02-15 NOTE — Progress Notes (Signed)
Daily Session Note  Patient Details  Name: LAURENA VALKO MRN: [id number] Date of Birth: 1945/08/19 Referring Provider:   Flowsheet Row CARDIAC REHAB PHASE II ORIENTATION from 01/19/2021 in [place]  Referring Provider Dr. Harrington Challenger       Encounter Date: 02/15/2021  Check In:  Session Check In - 02/15/21 0815       Check-In   Supervising physician immediately available to respond to emergencies [facility] MD immediately available    Physician(s) Dr. Domenic Polite    Location [facility]    Staff Present Hoy Register, MS, ACSM-CEP, Exercise Physiologist;Debra Wynetta Emery, RN, BSN    Virtual Visit No    Medication changes reported     No    Fall or balance concerns reported    No    Tobacco Cessation No Change    Warm-up and Cool-down Performed as group-led instruction    Resistance Training Performed No   arrived late   VAD Patient? No    PAD/SET Patient? No      Pain Assessment   Currently in Pain? No/denies    Pain Score 0-No pain    Multiple Pain Sites No             Capillary Blood Glucose: No results found for this or any previous visit (from the past 24 hour(s)).    Social History   Tobacco Use  Smoking Status Never  Smokeless Tobacco Never    Goals Met:  Independence with exercise equipment Exercise tolerated well Personal goals reviewed  Goals Unmet:  Not Applicable  Comments: checkout time Is 0915   Dr. Kathie Dike is Medical Director for [facility] Pulmonary Rehab.

## 2021-02-17 ENCOUNTER — Other Ambulatory Visit: Payer: Self-pay

## 2021-02-17 ENCOUNTER — Ambulatory Visit (HOSPITAL_COMMUNITY)
Admission: RE | Admit: 2021-02-17 | Discharge: 2021-02-17 | Disposition: A | Discharge status: Home / Self Care | Payer: Medicare Other | Source: Ambulatory Visit | Attending: Internal Medicine | Admitting: Internal Medicine

## 2021-02-17 ENCOUNTER — Encounter (HOSPITAL_COMMUNITY): Payer: Medicare Other

## 2021-02-17 DIAGNOSIS — R0602 Shortness of breath: Secondary | ICD-10-CM | POA: Diagnosis not present

## 2021-02-17 DIAGNOSIS — N390 Urinary tract infection, site not specified: Secondary | ICD-10-CM | POA: Diagnosis not present

## 2021-02-17 DIAGNOSIS — D6869 Other thrombophilia: Secondary | ICD-10-CM | POA: Diagnosis not present

## 2021-02-17 DIAGNOSIS — I4891 Unspecified atrial fibrillation: Secondary | ICD-10-CM | POA: Diagnosis not present

## 2021-02-17 DIAGNOSIS — R350 Frequency of micturition: Secondary | ICD-10-CM | POA: Diagnosis not present

## 2021-02-17 DIAGNOSIS — Z299 Encounter for prophylactic measures, unspecified: Secondary | ICD-10-CM | POA: Diagnosis not present

## 2021-02-17 LAB — ECHOCARDIOGRAM COMPLETE: Area-P 1/2: 2.83 cm2

## 2021-02-17 MED ORDER — PERFLUTREN LIPID MICROSPHERE
1.0000 mL | INTRAVENOUS | Status: AC | PRN
  Administered 2021-02-17: 2 mL via INTRAVENOUS

## 2021-02-17 NOTE — Progress Notes (Signed)
*  PRELIMINARY RESULTS* Echocardiogram 2D Echocardiogram has been performed with Definity.  Samuel Germany 02/17/2021, 12:29 PM

## 2021-02-20 ENCOUNTER — Encounter (HOSPITAL_COMMUNITY): Payer: Medicare Other

## 2021-02-22 ENCOUNTER — Encounter (HOSPITAL_COMMUNITY): Payer: Medicare Other

## 2021-02-23 ENCOUNTER — Telehealth: Payer: Self-pay | Admitting: Internal Medicine

## 2021-02-23 ENCOUNTER — Telehealth: Payer: Self-pay

## 2021-02-23 NOTE — Telephone Encounter (Signed)
Returning a call to [place] for lab results

## 2021-02-23 NOTE — Telephone Encounter (Signed)
Pt notified and voiced understanding. Pt had no questions or concerns.

## 2021-02-23 NOTE — Telephone Encounter (Signed)
-----   Message from [place], MD sent at 02/21/2021  7:34 PM EDT ----- I have reviewed echo   LV functoin is normal     RV function is mildly down  It is not much different than before surgery    Last rehab note nursing wrote that she was doing OK I would keep on same meds   Keep up with rehab.

## 2021-02-23 NOTE — Telephone Encounter (Signed)
Fay Records, MD  I have reviewed echo   LV functoin is normal     RV function is mildly down  It is not much different than before surgery    Last rehab note nursing wrote that she was doing OK  I would keep on same meds   Keep up with rehab

## 2021-02-24 ENCOUNTER — Encounter (HOSPITAL_COMMUNITY): Payer: Medicare Other

## 2021-02-24 DIAGNOSIS — E1165 Type 2 diabetes mellitus with hyperglycemia: Secondary | ICD-10-CM | POA: Diagnosis not present

## 2021-02-24 DIAGNOSIS — N390 Urinary tract infection, site not specified: Secondary | ICD-10-CM | POA: Diagnosis not present

## 2021-02-24 DIAGNOSIS — E538 Deficiency of other specified B group vitamins: Secondary | ICD-10-CM | POA: Diagnosis not present

## 2021-02-24 DIAGNOSIS — I4891 Unspecified atrial fibrillation: Secondary | ICD-10-CM | POA: Diagnosis not present

## 2021-02-24 DIAGNOSIS — I10 Essential (primary) hypertension: Secondary | ICD-10-CM | POA: Diagnosis not present

## 2021-02-24 DIAGNOSIS — Z299 Encounter for prophylactic measures, unspecified: Secondary | ICD-10-CM | POA: Diagnosis not present

## 2021-02-27 ENCOUNTER — Other Ambulatory Visit: Payer: Self-pay

## 2021-02-27 ENCOUNTER — Encounter (HOSPITAL_COMMUNITY)
Admission: RE | Admit: 2021-02-27 | Discharge: 2021-02-27 | Disposition: A | Discharge status: Home / Self Care | Payer: Medicare Other | Source: Ambulatory Visit | Attending: Internal Medicine | Admitting: Internal Medicine

## 2021-02-27 VITALS — Wt 151.5 lb

## 2021-02-27 DIAGNOSIS — Z951 Presence of aortocoronary bypass graft: Secondary | ICD-10-CM | POA: Diagnosis not present

## 2021-02-27 DIAGNOSIS — G459 Transient cerebral ischemic attack, unspecified: Secondary | ICD-10-CM | POA: Diagnosis not present

## 2021-02-27 NOTE — Progress Notes (Signed)
Daily Session Note  Patient Details  Name: Katrina Castro MRN: [id number] Date of Birth: 08-19-1945 Referring Provider:   Flowsheet Row CARDIAC REHAB PHASE II ORIENTATION from 01/19/2021 in [place]  Referring Provider Dr. Harrington Challenger       Encounter Date: 02/27/2021  Check In:  Session Check In - 02/27/21 0815       Check-In   Supervising physician immediately available to respond to emergencies [facility] MD immediately available    Physician(s) Dr. Domenic Polite    Location [facility]    Staff Present Geanie Cooley, RN;Debra Wynetta Emery, RN, BSN    Virtual Visit No    Medication changes reported     No    Fall or balance concerns reported    No    Tobacco Cessation No Change    Warm-up and Cool-down Performed as group-led instruction    Resistance Training Performed No    VAD Patient? No    PAD/SET Patient? No      Pain Assessment   Currently in Pain? No/denies    Pain Score 0-No pain    Multiple Pain Sites No             Capillary Blood Glucose: No results found for this or any previous visit (from the past 24 hour(s)).    Social History   Tobacco Use  Smoking Status Never  Smokeless Tobacco Never    Goals Met:  Independence with exercise equipment Exercise tolerated well No report of concerns or symptoms today Strength training completed today  Goals Unmet:  Not Applicable  Comments: check out @ 9:15am   Dr. Kathie Dike is Medical Director for [facility] Pulmonary Rehab.

## 2021-03-01 ENCOUNTER — Encounter (HOSPITAL_COMMUNITY)
Admission: RE | Admit: 2021-03-01 | Discharge: 2021-03-01 | Disposition: A | Discharge status: Home / Self Care | Payer: Medicare Other | Source: Ambulatory Visit | Attending: Internal Medicine | Admitting: Internal Medicine

## 2021-03-01 ENCOUNTER — Other Ambulatory Visit: Payer: Self-pay

## 2021-03-01 DIAGNOSIS — Z951 Presence of aortocoronary bypass graft: Secondary | ICD-10-CM | POA: Diagnosis not present

## 2021-03-01 NOTE — Progress Notes (Signed)
Cardiac Individual Treatment Plan  Patient Details  Name: Katrina Castro MRN: [id number] Date of Birth: 1945-11-05 Referring Provider:   Flowsheet Row CARDIAC REHAB PHASE II ORIENTATION from 01/19/2021 in [place]  Referring Provider Dr. Harrington Challenger       Initial Encounter Date:  Flowsheet Row CARDIAC REHAB PHASE II ORIENTATION from 01/19/2021 in [facility]  Date 01/19/21       Visit Diagnosis: S/P CABG x 5  Patient's Home Medications on Admission:  Current Outpatient Medications:    acetaminophen (TYLENOL) 325 MG tablet, Take 325 mg by mouth every 6 (six) hours as needed for headache., Disp: , Rfl:    albuterol (VENTOLIN HFA) 108 (90 Base) MCG/ACT inhaler, Inhale 1 puff into the lungs every 4 (four) hours as needed for wheezing or shortness of breath., Disp: , Rfl:    ALPRAZolam (XANAX) 0.5 MG tablet, Take 0.25 mg by mouth at bedtime as needed for anxiety or sleep., Disp: , Rfl:    aspirin EC 81 MG EC tablet, Take 1 tablet (81 mg total) by mouth daily. Swallow whole., Disp: 30 tablet, Rfl: 11   Biotin w/ Vitamins C & E (HAIR SKIN & NAILS GUMMIES PO), Take 2 capsules by mouth daily., Disp: , Rfl:    Cholecalciferol (VITAMIN D) 50 MCG (2000 UT) tablet, Take 2,000 Units by mouth daily., Disp: , Rfl:    clopidogrel (PLAVIX) 75 MG tablet, Take 1 tablet (75 mg total) by mouth daily., Disp: 90 tablet, Rfl: 3   cyanocobalamin (,VITAMIN B-12,) 1000 MCG/ML injection, Inject 1 mg into the muscle every 14 (fourteen) days., Disp: , Rfl:    Cyanocobalamin (B-12 COMPLIANCE INJECTION IJ), Inject 1 Dose as directed every 14 (fourteen) days., Disp: , Rfl:    ezetimibe (ZETIA) 10 MG tablet, Take 1 tablet (10 mg total) by mouth daily., Disp: 90 tablet, Rfl: 3   ferrous gluconate (FERGON) 324 MG tablet, Take 324 mg by mouth daily., Disp: , Rfl:    furosemide (LASIX) 40 MG tablet, Take 40 mg every THIRD day until your weight is back to baseline, Disp: 30 tablet,  Rfl: 0   glipiZIDE (GLUCOTROL XL) 5 MG 24 hr tablet, Take 5 mg by mouth 2 (two) times daily., Disp: , Rfl:    levocetirizine (XYZAL) 5 MG tablet, Take 5 mg by mouth daily., Disp: , Rfl:    Menthol-Methyl Salicylate (MUSCLE RUB EX), Apply 1 application topically daily as needed (muscle pain). [place] gel, Disp: , Rfl:    metoprolol tartrate (LOPRESSOR) 25 MG tablet, Take 1 tablet (25 mg total) by mouth 2 (two) times daily., Disp: 180 tablet, Rfl: 3   Multiple Vitamins-Minerals (ADULT GUMMY PO), Take 2 capsules by mouth daily., Disp: , Rfl:    nitroGLYCERIN (NITROSTAT) 0.4 MG SL tablet, Place 1 tablet (0.4 mg total) under the tongue every 5 (five) minutes as needed for chest pain., Disp: 25 tablet, Rfl: 3   oxybutynin (DITROPAN-XL) 10 MG 24 hr tablet, Take 10 mg by mouth daily., Disp: , Rfl:    pantoprazole (PROTONIX) 40 MG tablet, Take 40 mg by mouth daily., Disp: , Rfl:    potassium chloride (KLOR-CON) 10 MEQ tablet, Take 10 meq every THIRD day until weight is back to baseline. Take with Lasix, Disp: 30 tablet, Rfl: 0   rosuvastatin (CRESTOR) 40 MG tablet, Take 1 tablet (40 mg total) by mouth daily., Disp: 90 tablet, Rfl: 2   urea (CARMOL) 40 % CREA, Apply 1 application topically daily., Disp:  120 g, Rfl: 2   vitamin B-12 (CYANOCOBALAMIN) 1000 MCG tablet, Take 1,000 mcg by mouth daily., Disp: , Rfl:    Vitamin D, Ergocalciferol, (DRISDOL) 1.25 MG (50000 UNIT) CAPS capsule, Take 50,000 Units by mouth every 14 (fourteen) days., Disp: , Rfl:   Past Medical History: Past Medical History:  Diagnosis Date   Arthritis    CAD (coronary artery disease)    a. s/p CABG on 04/27/2020 with LIMA-LAD, seq SVG-PDA-PL, seq left radial-RI-OM   Concussion    2015   Depression    Diabetes ([place])    type 2   Dysphagia    Dysrhythmia    afib   GERD (gastroesophageal reflux disease)    Headache    History of bronchitis    Hypertension    Seasonal allergies    Toenail fungus     Tobacco Use: Social  History   Tobacco Use  Smoking Status Never  Smokeless Tobacco Never    Labs: Recent Review Flowsheet Data     Labs for ITP Cardiac and Pulmonary Rehab Latest Ref Rng & Units 05/12/2020 05/14/2020 08/31/2020 11/23/2020 01/25/2021   Cholestrol 0 - 200 mg/dL - - 160 207(H) 106   LDLCALC 0 - 99 mg/dL - - 88 108(H) 35   HDL >40 mg/dL - - 41 49 38(L)   Trlycerides <150 mg/dL - 131 156(H) 250(H) 164(H)   Hemoglobin A1c 4.8 - 5.6 % 6.4(H) - - - -   PHART 7.350 - 7.450 - - - - -   PCO2ART 32.0 - 48.0 mmHg - - - - -   HCO3 20.0 - 28.0 mmol/L - - - - -   TCO2 22 - 32 mmol/L - - - - -   ACIDBASEDEF 0.0 - 2.0 mmol/L - - - - -   O2SAT % - - - - -       Capillary Blood Glucose: Lab Results  Component Value Date   GLUCAP 310 (H) 01/30/2021   GLUCAP 219 (H) 01/23/2021   GLUCAP 283 (H) 01/19/2021   GLUCAP 173 (H) 01/17/2021   GLUCAP 187 (H) 05/17/2020    POCT Glucose     Row Name 01/19/21 1325             POCT Blood Glucose   Pre-Exercise 283 mg/dL                Exercise Target Goals: Exercise Program Goal: Individual exercise prescription set using results from initial 6 min walk test and THRR while considering  patient's activity barriers and safety.   Exercise Prescription Goal: Starting with aerobic activity 30 plus minutes a day, 3 days per week for initial exercise prescription. Provide home exercise prescription and guidelines that participant acknowledges understanding prior to discharge.  Activity Barriers & Risk Stratification:  Activity Barriers & Cardiac Risk Stratification - 01/19/21 1320       Activity Barriers & Cardiac Risk Stratification   Activity Barriers Arthritis;Back Problems;Right Knee Replacement;Incisional Pain    Cardiac Risk Stratification High             6 Minute Walk:  6 Minute Walk     Row Name 01/19/21 1552         6 Minute Walk   Phase Initial     Distance 1400 feet     Walk Time 6 minutes     # of Rest Breaks 0     MPH  2.65     METS 2.59  RPE 12     VO2 Peak 9.05     Symptoms No     Resting HR 62 bpm     Resting BP 118/70     Resting Oxygen Saturation  97 %     Exercise Oxygen Saturation  during 6 min walk 97 %     Max Ex. HR 84 bpm     Max Ex. BP 140/72     2 Minute Post BP 106/64              Oxygen Initial Assessment:   Oxygen Re-Evaluation:   Oxygen Discharge (Final Oxygen Re-Evaluation):   Initial Exercise Prescription:  Initial Exercise Prescription - 01/19/21 1500       Date of Initial Exercise RX and Referring Provider   Date 01/19/21    Referring Provider Dr. Harrington Challenger    Expected Discharge Date 04/14/21      Treadmill   MPH 1.5    Grade 0    Minutes 17      NuStep   Level 1    SPM 80    Minutes 22      Prescription Details   Frequency (times per week) 3    Duration Progress to 30 minutes of continuous aerobic without signs/symptoms of physical distress      Intensity   THRR 40-80% of Max Heartrate 58-116    Ratings of Perceived Exertion 11-13    Perceived Dyspnea 0-4      Resistance Training   Training Prescription Yes    Weight 3 lbs    Reps 10-15             Perform Capillary Blood Glucose checks as needed.  Exercise Prescription Changes:   Exercise Prescription Changes     Row Name 01/27/21 0815 02/13/21 0815 02/27/21 0815         Response to Exercise   Blood Pressure (Admit) 94/60 106/60 110/58     Blood Pressure (Exercise) 122/64 110/50 120/62     Blood Pressure (Exit) 118/78 118/60 112/58     Heart Rate (Admit) 62 bpm 76 bpm 68 bpm     Heart Rate (Exercise) 81 bpm 95 bpm 82 bpm     Heart Rate (Exit) 71 bpm 85 bpm 76 bpm     Rating of Perceived Exertion (Exercise) '12 10 12     '$ Duration Continue with 30 min of aerobic exercise without signs/symptoms of physical distress. Continue with 30 min of aerobic exercise without signs/symptoms of physical distress. Continue with 30 min of aerobic exercise without signs/symptoms of physical  distress.     Intensity THRR unchanged THRR unchanged THRR unchanged           Progression   Progression Continue to progress workloads to maintain intensity without signs/symptoms of physical distress. Continue to progress workloads to maintain intensity without signs/symptoms of physical distress. Continue to progress workloads to maintain intensity without signs/symptoms of physical distress.           Resistance Training   Training Prescription Yes Yes Yes     Weight 3 lbs 3 lbs 3 lbs     Reps 10-15 10-15 10-15     Time 10 Minutes 10 Minutes 10 Minutes           Treadmill   MPH 1.7 2.2 2.2     Grade 0 0 0     Minutes '22 22 22     '$ METs 2.3 2.69 2.69  NuStep   Level '1 1 1     '$ SPM 56 66 70     Minutes '22 17 17     '$ METs 1.6 1.6 1.65              Exercise Comments:   Exercise Goals and Review:   Exercise Goals     Row Name 01/19/21 1556 01/30/21 0956 02/28/21 0929         Exercise Goals   Increase Physical Activity Yes Yes Yes     Intervention Provide advice, education, support and counseling about physical activity/exercise needs.;Develop an individualized exercise prescription for aerobic and resistive training based on initial evaluation findings, risk stratification, comorbidities and participant's personal goals. Provide advice, education, support and counseling about physical activity/exercise needs.;Develop an individualized exercise prescription for aerobic and resistive training based on initial evaluation findings, risk stratification, comorbidities and participant's personal goals. Provide advice, education, support and counseling about physical activity/exercise needs.;Develop an individualized exercise prescription for aerobic and resistive training based on initial evaluation findings, risk stratification, comorbidities and participant's personal goals.     Expected Outcomes Short Term: Attend rehab on a regular basis to increase amount of  physical activity.;Long Term: Add in home exercise to make exercise part of routine and to increase amount of physical activity.;Long Term: Exercising regularly at least 3-5 days a week. Short Term: Attend rehab on a regular basis to increase amount of physical activity.;Long Term: Add in home exercise to make exercise part of routine and to increase amount of physical activity.;Long Term: Exercising regularly at least 3-5 days a week. Short Term: Attend rehab on a regular basis to increase amount of physical activity.;Long Term: Add in home exercise to make exercise part of routine and to increase amount of physical activity.;Long Term: Exercising regularly at least 3-5 days a week.     Increase Strength and Stamina Yes Yes Yes     Intervention Provide advice, education, support and counseling about physical activity/exercise needs.;Develop an individualized exercise prescription for aerobic and resistive training based on initial evaluation findings, risk stratification, comorbidities and participant's personal goals. Provide advice, education, support and counseling about physical activity/exercise needs.;Develop an individualized exercise prescription for aerobic and resistive training based on initial evaluation findings, risk stratification, comorbidities and participant's personal goals. Provide advice, education, support and counseling about physical activity/exercise needs.;Develop an individualized exercise prescription for aerobic and resistive training based on initial evaluation findings, risk stratification, comorbidities and participant's personal goals.     Expected Outcomes Short Term: Increase workloads from initial exercise prescription for resistance, speed, and METs.;Short Term: Perform resistance training exercises routinely during rehab and add in resistance training at home;Long Term: Improve cardiorespiratory fitness, muscular endurance and strength as measured by increased METs and  functional capacity (6MWT) Short Term: Increase workloads from initial exercise prescription for resistance, speed, and METs.;Short Term: Perform resistance training exercises routinely during rehab and add in resistance training at home;Long Term: Improve cardiorespiratory fitness, muscular endurance and strength as measured by increased METs and functional capacity (6MWT) Short Term: Increase workloads from initial exercise prescription for resistance, speed, and METs.;Short Term: Perform resistance training exercises routinely during rehab and add in resistance training at home;Long Term: Improve cardiorespiratory fitness, muscular endurance and strength as measured by increased METs and functional capacity (6MWT)     Able to understand and use rate of perceived exertion (RPE) scale Yes Yes Yes     Intervention Provide education and explanation on how to use RPE scale Provide education  and explanation on how to use RPE scale Provide education and explanation on how to use RPE scale     Expected Outcomes Short Term: Able to use RPE daily in rehab to express subjective intensity level;Long Term:  Able to use RPE to guide intensity level when exercising independently Short Term: Able to use RPE daily in rehab to express subjective intensity level;Long Term:  Able to use RPE to guide intensity level when exercising independently Short Term: Able to use RPE daily in rehab to express subjective intensity level;Long Term:  Able to use RPE to guide intensity level when exercising independently     Knowledge and understanding of Target Heart Rate Range (THRR) Yes Yes Yes     Intervention Provide education and explanation of THRR including how the numbers were predicted and where they are located for reference Provide education and explanation of THRR including how the numbers were predicted and where they are located for reference Provide education and explanation of THRR including how the numbers were predicted and  where they are located for reference     Expected Outcomes Short Term: Able to state/look up THRR;Long Term: Able to use THRR to govern intensity when exercising independently;Short Term: Able to use daily as guideline for intensity in rehab Short Term: Able to state/look up THRR;Long Term: Able to use THRR to govern intensity when exercising independently;Short Term: Able to use daily as guideline for intensity in rehab Short Term: Able to state/look up THRR;Long Term: Able to use THRR to govern intensity when exercising independently;Short Term: Able to use daily as guideline for intensity in rehab     Able to check pulse independently Yes Yes Yes     Intervention Provide education and demonstration on how to check pulse in carotid and radial arteries.;Review the importance of being able to check your own pulse for safety during independent exercise Provide education and demonstration on how to check pulse in carotid and radial arteries.;Review the importance of being able to check your own pulse for safety during independent exercise Provide education and demonstration on how to check pulse in carotid and radial arteries.;Review the importance of being able to check your own pulse for safety during independent exercise     Expected Outcomes Short Term: Able to explain why pulse checking is important during independent exercise;Long Term: Able to check pulse independently and accurately Short Term: Able to explain why pulse checking is important during independent exercise;Long Term: Able to check pulse independently and accurately Short Term: Able to explain why pulse checking is important during independent exercise;Long Term: Able to check pulse independently and accurately     Understanding of Exercise Prescription Yes Yes Yes     Intervention Provide education, explanation, and written materials on patient's individual exercise prescription Provide education, explanation, and written materials on  patient's individual exercise prescription Provide education, explanation, and written materials on patient's individual exercise prescription     Expected Outcomes Short Term: Able to explain program exercise prescription;Long Term: Able to explain home exercise prescription to exercise independently Short Term: Able to explain program exercise prescription;Long Term: Able to explain home exercise prescription to exercise independently Short Term: Able to explain program exercise prescription;Long Term: Able to explain home exercise prescription to exercise independently              Exercise Goals Re-Evaluation :  Exercise Goals Re-Evaluation     Row Name 01/30/21 [phone] 02/28/21 [phone]  Exercise Goal Re-Evaluation   Exercise Goals Review Increase Physical Activity;Increase Strength and Stamina;Able to understand and use rate of perceived exertion (RPE) scale;Knowledge and understanding of Target Heart Rate Range (THRR);Able to check pulse independently;Understanding of Exercise Prescription Increase Physical Activity;Increase Strength and Stamina;Able to understand and use rate of perceived exertion (RPE) scale;Knowledge and understanding of Target Heart Rate Range (THRR);Able to check pulse independently;Understanding of Exercise Prescription      Comments Pt has completed 3 sessions of cardiac rehab. Her CBG has been running high as of late, and she was unable to exercise today because it was 310 at her arrival. She sees her PCP tomorrow and will talk about these concerns with him. She also reports that she feels like she has no energy, so Dr. Harrington Challenger has cut back on her metoprolol. She currently exercises at 1.60 METs while on the stepper. Will continue to monitor and progress as able. Pt has completed 10 sessions of cardiac rehab. Her attendance has been inconsistent as of late. I do not feel as if she pushes herself as much as she could while on the stepper and is easily distracted. She  is currently exercising at 1.65 METs on the stepper. Will continue to monitor and progress as able.      Expected Outcomes Through exercise at rehab and at home, the patient will meet their stated goals. Through exercise at rehab and at home, the patient will meet their stated goals.                Discharge Exercise Prescription (Final Exercise Prescription Changes):  Exercise Prescription Changes - 02/27/21 0815       Response to Exercise   Blood Pressure (Admit) 110/58    Blood Pressure (Exercise) 120/62    Blood Pressure (Exit) 112/58    Heart Rate (Admit) 68 bpm    Heart Rate (Exercise) 82 bpm    Heart Rate (Exit) 76 bpm    Rating of Perceived Exertion (Exercise) 12    Duration Continue with 30 min of aerobic exercise without signs/symptoms of physical distress.    Intensity THRR unchanged      Progression   Progression Continue to progress workloads to maintain intensity without signs/symptoms of physical distress.      Resistance Training   Training Prescription Yes    Weight 3 lbs    Reps 10-15    Time 10 Minutes      Treadmill   MPH 2.2    Grade 0    Minutes 22    METs 2.69      NuStep   Level 1    SPM 70    Minutes 17    METs 1.65             Nutrition:  Target Goals: Understanding of nutrition guidelines, daily intake of sodium '1500mg'$ , cholesterol '200mg'$ , calories 30% from fat and 7% or less from saturated fats, daily to have 5 or more servings of fruits and vegetables.  Biometrics:  Pre Biometrics - 01/19/21 1557       Pre Biometrics   Height 5' 2.5" (1.588 m)    Weight 70.8 kg    Waist Circumference 43.5 inches    Hip Circumference 40.5 inches    Waist to Hip Ratio 1.07 %    BMI (Calculated) 28.08    Triceps Skinfold 28 mm    % Body Fat 43.1 %    Grip Strength 21.1 kg    Flexibility 0 in  Single Leg Stand 2.56 seconds              Nutrition Therapy Plan and Nutrition Goals:  Nutrition Therapy & Goals - 01/23/21 1011        Personal Nutrition Goals   Comments Patient scored 10 on her diet assessment. We offer 2 educational sessons on heart healthy nutrition with handouts and assistance with RD referral if interested.      Intervention Plan   Intervention Nutrition handout(s) given to patient.             Nutrition Assessments:  Nutrition Assessments - 01/19/21 1317       MEDFICTS Scores   Pre Score 10            MEDIFICTS Score Key: ?70 Need to make dietary changes  40-70 Heart Healthy Diet ? 40 Therapeutic Level Cholesterol Diet   Picture Your Plate Scores: [id number] Unhealthy dietary pattern with much room for improvement. 41-50 Dietary pattern unlikely to meet recommendations for good health and room for improvement. 51-60 More healthful dietary pattern, with some room for improvement.  >60 Healthy dietary pattern, although there may be some specific behaviors that could be improved.    Nutrition Goals Re-Evaluation:   Nutrition Goals Discharge (Final Nutrition Goals Re-Evaluation):   Psychosocial: Target Goals: Acknowledge presence or absence of significant depression and/or stress, maximize coping skills, provide positive support system. Participant is able to verbalize types and ability to use techniques and skills needed for reducing stress and depression.  Initial Review & Psychosocial Screening:  Initial Psych Review & Screening - 01/19/21 1306       Initial Review   Current issues with Current Sleep Concerns      Family Dynamics   Good Support System? No    Strains Intra-family strains    Comments She does not get along well with her daughter. Her daughter does not help her out and is not there for her when needed.      Barriers   Psychosocial barriers to participate in program The patient should benefit from training in stress management and relaxation.      Screening Interventions   Interventions Encouraged to exercise;To provide support and resources with  identified psychosocial needs;Provide feedback about the scores to participant    Expected Outcomes Long Term Goal: Stressors or current issues are controlled or eliminated.;Long Term goal: The participant improves quality of Life and PHQ9 Scores as seen by post scores and/or verbalization of changes             Quality of Life Scores:  Quality of Life - 01/19/21 1308       Quality of Life   Select Quality of Life      Quality of Life Scores   Health/Function Pre 21.59 %    Socioeconomic Pre 26 %    Psych/Spiritual Pre 27.43 %    Family Pre 21 %    GLOBAL Pre 24.05 %            Scores of 19 and below usually indicate a poorer quality of life in these areas.  A difference of  2-3 points is a clinically meaningful difference.  A difference of 2-3 points in the total score of the Quality of Life Index has been associated with significant improvement in overall quality of life, self-image, physical symptoms, and general health in studies assessing change in quality of life.  PHQ-9: Recent Review Flowsheet Data     Depression screen [facility] 2/9  01/19/2021 07/11/2020   Decreased Interest 0 1   Down, Depressed, Hopeless 0 1   PHQ - 2 Score 0 2   Altered sleeping 3 0   Tired, decreased energy 0 0   Change in appetite 3 1   Feeling bad or failure about yourself  0 0   Trouble concentrating 0 0   Moving slowly or fidgety/restless 0 0   Suicidal thoughts 0 0   PHQ-9 Score 6 3   Difficult doing work/chores Not difficult at all Somewhat difficult      Interpretation of Total Score  Total Score Depression Severity:  1-4 = Minimal depression, 5-9 = Mild depression, 10-14 = Moderate depression, 15-19 = Moderately severe depression, 20-27 = Severe depression   Psychosocial Evaluation and Intervention:  Psychosocial Evaluation - 01/19/21 1544       Psychosocial Evaluation & Interventions   Interventions Stress management education;Relaxation education;Encouraged to exercise with the  program and follow exercise prescription    Comments Pt has no identifiable barriers to participating in rehab. She has no identifiable psychosocial issues. She does report problems falling asleep and staying asleep; she is prescribed xanax for this, but reports that she does not think that it helps much. She reports that she does not have a good support system. She states that she does not get along well with her daughter, and she feels like her daughter does not support her when she needs her to. Her goals while in the program are to lose 10-15 lbs and decrease her SOB with activity. She wants to get back to where she can dance again, as that is one of her hobbies. She is eager to start the progam.    Expected Outcomes The patient will continue to not have any identifiable psychosocial issues.    Continue Psychosocial Services  No Follow up required             Psychosocial Re-Evaluation:  Psychosocial Re-Evaluation     [place] Name 01/23/21 1013 02/22/21 1022           Psychosocial Re-Evaluation   Current issues with Current Sleep Concerns --      Comments Patient is new to the program starting today. She continues to have no psychosocial barriers identified. She uses alprazolam for sleep which helps most of the time. She demonstrates a very positive outllook and an interest in improving her health. We will continue to monitor her progress. Patient has completed 9 sessions. She continues to have no psychosocial barriers identified. She continues to  use alprazolam for sleep which helps most of the time. She demonstrates a very positive outllook and an interest in improving her health. We will continue to monitor her progress.      Expected Outcomes Patient will continue to have no psychosocial barriers identified and her sleep concerns will continue to be managed with Alprazolam. Patient will continue to have no psychosocial barriers identified and her sleep concerns will continue to be managed  with Alprazolam.      Interventions Stress management education;Encouraged to attend Cardiac Rehabilitation for the exercise;Relaxation education Stress management education;Encouraged to attend Cardiac Rehabilitation for the exercise;Relaxation education      Continue Psychosocial Services  No Follow up required No Follow up required               Psychosocial Discharge (Final Psychosocial Re-Evaluation):  Psychosocial Re-Evaluation - 02/22/21 1022       Psychosocial Re-Evaluation   Comments Patient has completed 9 sessions.  She continues to have no psychosocial barriers identified. She continues to  use alprazolam for sleep which helps most of the time. She demonstrates a very positive outllook and an interest in improving her health. We will continue to monitor her progress.    Expected Outcomes Patient will continue to have no psychosocial barriers identified and her sleep concerns will continue to be managed with Alprazolam.    Interventions Stress management education;Encouraged to attend Cardiac Rehabilitation for the exercise;Relaxation education    Continue Psychosocial Services  No Follow up required             Vocational Rehabilitation: Provide vocational rehab assistance to qualifying candidates.   Vocational Rehab Evaluation & Intervention:  Vocational Rehab - 01/19/21 1312       Initial Vocational Rehab Evaluation & Intervention   Assessment shows need for Vocational Rehabilitation No             Education: Education Goals: Education classes will be provided on a weekly basis, covering required topics. Participant will state understanding/return demonstration of topics presented.  Learning Barriers/Preferences:  Learning Barriers/Preferences - 01/19/21 1312       Learning Barriers/Preferences   Learning Barriers None    Learning Preferences Skilled Demonstration;Individual Instruction             Education Topics: Hypertension, Hypertension  Reduction -Define heart disease and high blood pressure. Discus how high blood pressure affects the body and ways to reduce high blood pressure.   Exercise and Your Heart -Discuss why it is important to exercise, the FITT principles of exercise, normal and abnormal responses to exercise, and how to exercise safely.   Angina -Discuss definition of angina, causes of angina, treatment of angina, and how to decrease risk of having angina.   Cardiac Medications -Review what the following cardiac medications are used for, how they affect the body, and side effects that may occur when taking the medications.  Medications include Aspirin, Beta blockers, calcium channel blockers, ACE Inhibitors, angiotensin receptor blockers, diuretics, digoxin, and antihyperlipidemics.   Congestive Heart Failure -Discuss the definition of CHF, how to live with CHF, the signs and symptoms of CHF, and how keep track of weight and sodium intake.   Heart Disease and Intimacy -Discus the effect sexual activity has on the heart, how changes occur during intimacy as we age, and safety during sexual activity.   Smoking Cessation / COPD -Discuss different methods to quit smoking, the health benefits of quitting smoking, and the definition of COPD. Flowsheet Row CARDIAC REHAB PHASE II EXERCISE from 02/08/2021 in [place]  Date 02/01/21  Educator DF  Instruction Review Code 2- Demonstrated Understanding       Nutrition I: Fats -Discuss the types of cholesterol, what cholesterol does to the heart, and how cholesterol levels can be controlled. Flowsheet Row CARDIAC REHAB PHASE II EXERCISE from 02/08/2021 in [place]  Date 02/08/21  Educator DJ  Instruction Review Code 1- Verbalizes Understanding       Nutrition II: Labels -Discuss the different components of food labels and how to read food label   Heart Parts/Heart Disease and PAD -Discuss the anatomy of  the heart, the pathway of blood circulation through the heart, and these are affected by heart disease.   Stress I: Signs and Symptoms -Discuss the causes of stress, how stress may lead to anxiety and depression, and ways to limit stress.   Stress II: Relaxation -Discuss different types of relaxation techniques to limit  stress.   Warning Signs of Stroke / TIA -Discuss definition of a stroke, what the signs and symptoms are of a stroke, and how to identify when someone is having stroke.   Knowledge Questionnaire Score:  Knowledge Questionnaire Score - 01/19/21 1309       Knowledge Questionnaire Score   Pre Score 16/24             Core Components/Risk Factors/Patient Goals at Admission:  Personal Goals and Risk Factors at Admission - 01/19/21 1312       Core Components/Risk Factors/Patient Goals on Admission    Weight Management Yes;Weight Loss    Intervention Weight Management: Develop a combined nutrition and exercise program designed to reach desired caloric intake, while maintaining appropriate intake of nutrient and fiber, sodium and fats, and appropriate energy expenditure required for the weight goal.;Weight Management: Provide education and appropriate resources to help participant work on and attain dietary goals.;Weight Management/Obesity: Establish reasonable short term and long term weight goals.;Obesity: Provide education and appropriate resources to help participant work on and attain dietary goals.    Expected Outcomes Short Term: Continue to assess and modify interventions until short term weight is achieved;Long Term: Adherence to nutrition and physical activity/exercise program aimed toward attainment of established weight goal;Weight Maintenance: Understanding of the daily nutrition guidelines, which includes 25-35% calories from fat, 7% or less cal from saturated fats, less than '200mg'$  cholesterol, less than 1.5gm of sodium, & 5 or more servings of fruits and  vegetables daily;Weight Loss: Understanding of general recommendations for a balanced deficit meal plan, which promotes 1-2 lb weight loss per week and includes a negative energy balance of [phone] kcal/d;Understanding recommendations for meals to include 15-35% energy as protein, 25-35% energy from fat, 35-60% energy from carbohydrates, less than '200mg'$  of dietary cholesterol, 20-35 gm of total fiber daily;Understanding of distribution of calorie intake throughout the day with the consumption of 4-5 meals/snacks    Improve shortness of breath with ADL's Yes    Intervention Provide education, individualized exercise plan and daily activity instruction to help decrease symptoms of SOB with activities of daily living.    Expected Outcomes Short Term: Improve cardiorespiratory fitness to achieve a reduction of symptoms when performing ADLs;Long Term: Be able to perform more ADLs without symptoms or delay the onset of symptoms    Diabetes Yes    Intervention Provide education about signs/symptoms and action to take for hypo/hyperglycemia.;Provide education about proper nutrition, including hydration, and aerobic/resistive exercise prescription along with prescribed medications to achieve blood glucose in normal ranges: Fasting glucose 65-99 mg/dL    Expected Outcomes Short Term: Participant verbalizes understanding of the signs/symptoms and immediate care of hyper/hypoglycemia, proper foot care and importance of medication, aerobic/resistive exercise and nutrition plan for blood glucose control.;Long Term: Attainment of HbA1C < 7%.    Hypertension Yes    Intervention Provide education on lifestyle modifcations including regular physical activity/exercise, weight management, moderate sodium restriction and increased consumption of fresh fruit, vegetables, and low fat dairy, alcohol moderation, and smoking cessation.;Monitor prescription use compliance.    Expected Outcomes Short Term: Continued assessment and  intervention until BP is < 140/59m HG in hypertensive participants. < 130/848mHG in hypertensive participants with diabetes, heart failure or chronic kidney disease.;Long Term: Maintenance of blood pressure at goal levels.             Core Components/Risk Factors/Patient Goals Review:   Goals and Risk Factor Review     Row Name 01/23/21 1017 02/22/21 1022  Core Components/Risk Factors/Patient Goals Review   Personal Goals Review Weight Management/Obesity;Diabetes;Other --      Review Patient was referred to CR with [facility]. She has completed 1 sessions. Her DM is managed with Glipizide with her last A1C on file 05/12/21 at 6.4% which has trended down from 7.5%. Her personal goals for the program are to get stronger; exercise more; lose 10-15 lbs; and eat better. We will continue to monitor her progress as she works towards meeting these goals. Patient has completed 9 sessions maintaining her weight since her initial visit. Her attendance has not been consistent due to chronic GI distress which she blames on starting Metformin. She say her cardiologist 8/2. She decreased her metoprolol to 25 mg daily due to complaints of fatigue. She has a BMET and MD added lasix 40 mg with KCL every 3rd day for increased weight and she will continue until her weight is stable per MD. No recent A1C on file. Patient's personal goals for the program continue to be to get stronger; exerce more; lose 10 to 15 lbs and eat healthier. We will continue to monitor her progress as he works towards meeting these goals.      Expected Outcomes Patient will complete the program meeting both personal and program goals. Patient will complete the program meeting both personal and program goals.               Core Components/Risk Factors/Patient Goals at Discharge (Final Review):   Goals and Risk Factor Review - 02/22/21 1022       Core Components/Risk Factors/Patient Goals Review   Review Patient has  completed 9 sessions maintaining her weight since her initial visit. Her attendance has not been consistent due to chronic GI distress which she blames on starting Metformin. She say her cardiologist 8/2. She decreased her metoprolol to 25 mg daily due to complaints of fatigue. She has a BMET and MD added lasix 40 mg with KCL every 3rd day for increased weight and she will continue until her weight is stable per MD. No recent A1C on file. Patient's personal goals for the program continue to be to get stronger; exerce more; lose 10 to 15 lbs and eat healthier. We will continue to monitor her progress as he works towards meeting these goals.    Expected Outcomes Patient will complete the program meeting both personal and program goals.             ITP Comments:   Comments: ITP REVIEW Pt is making expected progress toward Cardiac Rehab goals after completing 11 sessions. Recommend continued exercise, life style modification, education, and increased stamina and strength.

## 2021-03-01 NOTE — Progress Notes (Signed)
Daily Session Note  Patient Details  Name: Katrina Castro MRN: [id number] Date of Birth: 03-07-1946 Referring Provider:   Flowsheet Row CARDIAC REHAB PHASE II ORIENTATION from 01/19/2021 in [place]  Referring Provider Dr. Harrington Challenger       Encounter Date: 03/01/2021  Check In:  Session Check In - 03/01/21 0815       Check-In   Supervising physician immediately available to respond to emergencies [facility] MD immediately available    Physician(s) Dr. Domenic Polite    Location [facility]    Staff Present Hoy Register, MS, ACSM-CEP, Exercise Physiologist;Debra Wynetta Emery, RN, BSN;Other    Virtual Visit No    Medication changes reported     No    Fall or balance concerns reported    No    Tobacco Cessation No Change    Warm-up and Cool-down Performed as group-led instruction    Resistance Training Performed Yes    VAD Patient? No    PAD/SET Patient? No      Pain Assessment   Currently in Pain? No/denies    Pain Score 0-No pain    Multiple Pain Sites No             Capillary Blood Glucose: No results found for this or any previous visit (from the past 24 hour(s)).    Social History   Tobacco Use  Smoking Status Never  Smokeless Tobacco Never    Goals Met:  Independence with exercise equipment Exercise tolerated well No report of concerns or symptoms today Strength training completed today  Goals Unmet:  Not Applicable  Comments: checkout time is 0915   Dr. Kathie Dike is Medical Director for [facility] Pulmonary Rehab.

## 2021-03-03 ENCOUNTER — Encounter (HOSPITAL_COMMUNITY)
Admission: RE | Admit: 2021-03-03 | Discharge: 2021-03-03 | Disposition: A | Discharge status: Home / Self Care | Payer: Medicare Other | Source: Ambulatory Visit | Attending: Internal Medicine | Admitting: Internal Medicine

## 2021-03-03 ENCOUNTER — Other Ambulatory Visit: Payer: Self-pay

## 2021-03-03 DIAGNOSIS — Z951 Presence of aortocoronary bypass graft: Secondary | ICD-10-CM

## 2021-03-03 NOTE — Progress Notes (Signed)
Daily Session Note  Patient Details  Name: Katrina Castro MRN: [id number] Date of Birth: Jan 30, 1946 Referring Provider:   Flowsheet Row CARDIAC REHAB PHASE II ORIENTATION from 01/19/2021 in [place]  Referring Provider Dr. Harrington Challenger       Encounter Date: 03/03/2021  Check In:  Session Check In - 03/03/21 0815       Check-In   Supervising physician immediately available to respond to emergencies [facility] MD immediately available    Physician(s) Dr. Harrington Challenger    Location [facility]    Staff Present Aundra Dubin, RN, BSN;Other;Dalton Fletcher, MS, ACSM-CEP, Exercise Physiologist    Virtual Visit No    Medication changes reported     No    Fall or balance concerns reported    No    Tobacco Cessation No Change    Warm-up and Cool-down Performed as group-led instruction    Resistance Training Performed Yes    VAD Patient? No    PAD/SET Patient? No      Pain Assessment   Currently in Pain? No/denies    Pain Score 0-No pain    Multiple Pain Sites No             Capillary Blood Glucose: No results found for this or any previous visit (from the past 24 hour(s)).    Social History   Tobacco Use  Smoking Status Never  Smokeless Tobacco Never    Goals Met:  Independence with exercise equipment Exercise tolerated well No report of concerns or symptoms today Strength training completed today  Goals Unmet:  Not Applicable  Comments: check out 0915   Dr. Kathie Dike is Medical Director for [facility] Pulmonary Rehab.

## 2021-03-06 ENCOUNTER — Encounter (HOSPITAL_COMMUNITY)
Admission: RE | Admit: 2021-03-06 | Discharge: 2021-03-06 | Disposition: A | Discharge status: Home / Self Care | Payer: Medicare Other | Source: Ambulatory Visit | Attending: Internal Medicine | Admitting: Internal Medicine

## 2021-03-06 ENCOUNTER — Other Ambulatory Visit: Payer: Self-pay

## 2021-03-06 DIAGNOSIS — Z951 Presence of aortocoronary bypass graft: Secondary | ICD-10-CM

## 2021-03-06 NOTE — Progress Notes (Signed)
Daily Session Note  Patient Details  Name: Katrina Castro MRN: [id number] Date of Birth: [birth year] Referring Provider:   Flowsheet Row CARDIAC REHAB PHASE II ORIENTATION from 01/19/2021 in [place]  Referring Provider Dr. Harrington Challenger       Encounter Date: 03/06/2021  Check In:  Session Check In - 03/06/21 0815       Check-In   Supervising physician immediately available to respond to emergencies [facility] MD immediately available    Physician(s) Dr. Harl Bowie    Location [facility]    Staff Present Hoy Register, MS, ACSM-CEP, Exercise Physiologist;Debra Wynetta Emery, RN, BSN;Quinton Voth Otho Ket, BS, Exercise Physiologist    Virtual Visit No    Medication changes reported     No    Fall or balance concerns reported    No    Tobacco Cessation No Change    Warm-up and Cool-down Performed as group-led instruction    Resistance Training Performed Yes    VAD Patient? No    PAD/SET Patient? No      Pain Assessment   Currently in Pain? No/denies    Pain Score 0-No pain    Multiple Pain Sites No             Capillary Blood Glucose: No results found for this or any previous visit (from the past 24 hour(s)).    Social History   Tobacco Use  Smoking Status Never  Smokeless Tobacco Never    Goals Met:  Independence with exercise equipment Exercise tolerated well No report of concerns or symptoms today Strength training completed today  Goals Unmet:  Not Applicable  Comments: check out 0915   Dr. Kathie Dike is Medical Director for [facility] Pulmonary Rehab.

## 2021-03-08 ENCOUNTER — Encounter (HOSPITAL_COMMUNITY)
Admission: RE | Admit: 2021-03-08 | Discharge: 2021-03-08 | Disposition: A | Discharge status: Home / Self Care | Payer: Medicare Other | Source: Ambulatory Visit | Attending: Internal Medicine | Admitting: Internal Medicine

## 2021-03-08 ENCOUNTER — Other Ambulatory Visit: Payer: Self-pay

## 2021-03-08 DIAGNOSIS — Z951 Presence of aortocoronary bypass graft: Secondary | ICD-10-CM

## 2021-03-08 NOTE — Progress Notes (Signed)
Daily Session Note  Patient Details  Name: ELEANORE JUNIO MRN: [id number] Date of Birth: 1945-10-20 Referring Provider:   Flowsheet Row CARDIAC REHAB PHASE II ORIENTATION from 01/19/2021 in [place]  Referring Provider Dr. Harrington Challenger       Encounter Date: 03/08/2021  Check In:  Session Check In - 03/08/21 0815       Check-In   Supervising physician immediately available to respond to emergencies [facility] MD immediately available    Physician(s) Dr. Harl Bowie    Location [facility]    Staff Present Redge Gainer, BS, Exercise Physiologist;Debra Wynetta Emery, RN, Bjorn Loser, MS, ACSM-CEP, Exercise Physiologist    Virtual Visit No    Medication changes reported     No    Fall or balance concerns reported    No    Tobacco Cessation No Change    Warm-up and Cool-down Performed as group-led instruction    Resistance Training Performed Yes    VAD Patient? No    PAD/SET Patient? No      Pain Assessment   Currently in Pain? No/denies    Pain Score 0-No pain    Multiple Pain Sites No             Capillary Blood Glucose: No results found for this or any previous visit (from the past 24 hour(s)).    Social History   Tobacco Use  Smoking Status Never  Smokeless Tobacco Never    Goals Met:  Independence with exercise equipment Exercise tolerated well No report of concerns or symptoms today Strength training completed today  Goals Unmet:  Not Applicable  Comments: check out 0915   Dr. Kathie Dike is Medical Director for [facility] Pulmonary Rehab.

## 2021-03-10 ENCOUNTER — Other Ambulatory Visit: Payer: Self-pay

## 2021-03-10 ENCOUNTER — Encounter (HOSPITAL_COMMUNITY)
Admission: RE | Admit: 2021-03-10 | Discharge: 2021-03-10 | Disposition: A | Discharge status: Home / Self Care | Payer: Medicare Other | Source: Ambulatory Visit | Attending: Internal Medicine | Admitting: Internal Medicine

## 2021-03-10 DIAGNOSIS — Z951 Presence of aortocoronary bypass graft: Secondary | ICD-10-CM | POA: Diagnosis not present

## 2021-03-10 NOTE — Progress Notes (Signed)
Daily Session Note  Patient Details  Name: Katrina Castro MRN: [id number] Date of Birth: Jun 23, 1945 Referring Provider:   Flowsheet Row CARDIAC REHAB PHASE II ORIENTATION from 01/19/2021 in [place]  Referring Provider Dr. Harrington Challenger       Encounter Date: 03/10/2021  Check In:  Session Check In - 03/10/21 0815       Check-In   Supervising physician immediately available to respond to emergencies [facility] MD immediately available    Physician(s) Dr. Harl Bowie    Location [facility]    Staff Present Hoy Register, MS, ACSM-CEP, Exercise Physiologist;Makailyn Mccormick Zigmund Daniel, Exercise Physiologist;Debra Wynetta Emery, RN, BSN    Virtual Visit No    Medication changes reported     No    Fall or balance concerns reported    No    Tobacco Cessation No Change    Warm-up and Cool-down Performed as group-led instruction    Resistance Training Performed Yes    VAD Patient? No    PAD/SET Patient? No      Pain Assessment   Currently in Pain? No/denies    Pain Score 0-No pain    Multiple Pain Sites No             Capillary Blood Glucose: No results found for this or any previous visit (from the past 24 hour(s)).    Social History   Tobacco Use  Smoking Status Never  Smokeless Tobacco Never    Goals Met:  Independence with exercise equipment Exercise tolerated well No report of concerns or symptoms today Strength training completed today  Goals Unmet:  Not Applicable  Comments: check out 0915   Dr. Kathie Dike is Medical Director for [facility] Pulmonary Rehab.

## 2021-03-13 ENCOUNTER — Other Ambulatory Visit: Payer: Self-pay

## 2021-03-13 ENCOUNTER — Encounter (HOSPITAL_COMMUNITY)
Admission: RE | Admit: 2021-03-13 | Discharge: 2021-03-13 | Disposition: A | Discharge status: Home / Self Care | Payer: Medicare Other | Source: Ambulatory Visit | Attending: Internal Medicine | Admitting: Internal Medicine

## 2021-03-13 VITALS — Wt 153.2 lb

## 2021-03-13 DIAGNOSIS — Z23 Encounter for immunization: Secondary | ICD-10-CM | POA: Diagnosis not present

## 2021-03-13 DIAGNOSIS — Z951 Presence of aortocoronary bypass graft: Secondary | ICD-10-CM | POA: Diagnosis not present

## 2021-03-13 DIAGNOSIS — E538 Deficiency of other specified B group vitamins: Secondary | ICD-10-CM | POA: Diagnosis not present

## 2021-03-13 NOTE — Progress Notes (Signed)
Daily Session Note  Patient Details  Name: Katrina Castro MRN: [id number] Date of Birth: [birth year] Referring Provider:   Flowsheet Row CARDIAC REHAB PHASE II ORIENTATION from 01/19/2021 in [place]  Referring Provider Dr. Harrington Challenger       Encounter Date: 03/13/2021  Check In:  Session Check In - 03/13/21 0815       Check-In   Supervising physician immediately available to respond to emergencies [facility] MD immediately available    Physician(s) Dr. Johnsie Cancel    Location [facility]    Staff Present Maurice Small, RN, Bjorn Loser, MS, ACSM-CEP, Exercise Physiologist;Kamdin Follett Zigmund Daniel, Exercise Physiologist    Medication changes reported     No    Fall or balance concerns reported    No    Tobacco Cessation No Change    Warm-up and Cool-down Performed as group-led instruction    Resistance Training Performed Yes    VAD Patient? No    PAD/SET Patient? No      Pain Assessment   Currently in Pain? No/denies    Pain Score 0-No pain    Multiple Pain Sites No             Capillary Blood Glucose: No results found for this or any previous visit (from the past 24 hour(s)).    Social History   Tobacco Use  Smoking Status Never  Smokeless Tobacco Never    Goals Met:  Independence with exercise equipment Exercise tolerated well No report of concerns or symptoms today Strength training completed today  Goals Unmet:  Not Applicable  Comments: check out 0915   Dr. Kathie Dike is Medical Director for [facility] Pulmonary Rehab.

## 2021-03-13 NOTE — Progress Notes (Signed)
I have reviewed a Home Exercise Prescription with Katrina Castro . Katrina Castro is currently exercising at home.  The patient was advised to walk 2 days a week for 30-45 minutes.  Katrina Castro and I discussed how to progress their exercise prescription.  The patient stated that their goals were lose weight and maintain healthy lifestyle.  The patient stated that they understand the exercise prescription.  We reviewed exercise guidelines, target heart rate during exercise, RPE Scale, weather conditions, NTG use, endpoints for exercise, warmup and cool down.  Patient is encouraged to come to me with any questions. I will continue to follow up with the patient to assist them with progression and safety.

## 2021-03-15 ENCOUNTER — Encounter (HOSPITAL_COMMUNITY)
Admission: RE | Admit: 2021-03-15 | Discharge: 2021-03-15 | Disposition: A | Discharge status: Home / Self Care | Payer: Medicare Other | Source: Ambulatory Visit | Attending: Internal Medicine | Admitting: Internal Medicine

## 2021-03-15 ENCOUNTER — Other Ambulatory Visit: Payer: Self-pay

## 2021-03-15 DIAGNOSIS — Z951 Presence of aortocoronary bypass graft: Secondary | ICD-10-CM | POA: Diagnosis not present

## 2021-03-15 NOTE — Progress Notes (Signed)
Daily Session Note  Patient Details  Name: Katrina Castro MRN: [id number] Date of Birth: Nov 19, 1945 Referring Provider:   Flowsheet Row CARDIAC REHAB PHASE II ORIENTATION from 01/19/2021 in [place]  Referring Provider Dr. Harrington Challenger       Encounter Date: 03/15/2021  Check In:  Session Check In - 03/15/21 0815       Check-In   Supervising physician immediately available to respond to emergencies [facility] MD immediately available    Physician(s) Dr. Domenic Polite    Location [facility]    Staff Present Geanie Cooley, RN;Heather Otho Ket, BS, Exercise Physiologist;Dalton Kris Mouton, MS, ACSM-CEP, Exercise Physiologist    Virtual Visit No    Medication changes reported     No    Fall or balance concerns reported    No    Tobacco Cessation No Change    Warm-up and Cool-down Performed as group-led instruction    Resistance Training Performed Yes    VAD Patient? No    PAD/SET Patient? No      Pain Assessment   Currently in Pain? No/denies    Pain Score 0-No pain    Multiple Pain Sites No             Capillary Blood Glucose: No results found for this or any previous visit (from the past 24 hour(s)).    Social History   Tobacco Use  Smoking Status Never  Smokeless Tobacco Never    Goals Met:  Independence with exercise equipment Exercise tolerated well No report of concerns or symptoms today Strength training completed today  Goals Unmet:  Not Applicable  Comments: check out @ 9:15am   Dr. Kathie Dike is Medical Director for [facility] Pulmonary Rehab.

## 2021-03-17 ENCOUNTER — Encounter (HOSPITAL_COMMUNITY): Payer: Medicare Other

## 2021-03-17 DIAGNOSIS — E7800 Pure hypercholesterolemia, unspecified: Secondary | ICD-10-CM | POA: Diagnosis not present

## 2021-03-17 DIAGNOSIS — I10 Essential (primary) hypertension: Secondary | ICD-10-CM | POA: Diagnosis not present

## 2021-03-20 ENCOUNTER — Encounter (HOSPITAL_COMMUNITY)
Admission: RE | Admit: 2021-03-20 | Discharge: 2021-03-20 | Disposition: A | Discharge status: Home / Self Care | Payer: Medicare Other | Source: Ambulatory Visit | Attending: Internal Medicine | Admitting: Internal Medicine

## 2021-03-20 ENCOUNTER — Other Ambulatory Visit: Payer: Self-pay

## 2021-03-20 DIAGNOSIS — Z951 Presence of aortocoronary bypass graft: Secondary | ICD-10-CM | POA: Insufficient documentation

## 2021-03-20 NOTE — Progress Notes (Signed)
Daily Session Note  Patient Details  Name: Katrina Castro MRN: [id number] Date of Birth: 03-Apr-1946 Referring Provider:   Flowsheet Row CARDIAC REHAB PHASE II ORIENTATION from 01/19/2021 in [place]  Referring Provider Dr. Harrington Challenger       Encounter Date: 03/20/2021  Check In:  Session Check In - 03/20/21 0815       Check-In   Supervising physician immediately available to respond to emergencies [facility] MD immediately available    Physician(s) Dr. Domenic Polite    Location [facility]    Staff Present Hoy Register, MS, ACSM-CEP, Exercise Physiologist;Debra Wynetta Emery, RN, BSN    Virtual Visit No    Medication changes reported     No    Fall or balance concerns reported    No    Tobacco Cessation No Change    Warm-up and Cool-down Performed as group-led instruction    Resistance Training Performed Yes    VAD Patient? No    PAD/SET Patient? No      Pain Assessment   Currently in Pain? No/denies    Pain Score 0-No pain    Multiple Pain Sites No             Capillary Blood Glucose: No results found for this or any previous visit (from the past 24 hour(s)).    Social History   Tobacco Use  Smoking Status Never  Smokeless Tobacco Never    Goals Met:  Independence with exercise equipment Exercise tolerated well No report of concerns or symptoms today Strength training completed today  Goals Unmet:  Not Applicable  Comments: checkout time is 0915   Dr. Kathie Dike is Medical Director for [facility].

## 2021-03-22 ENCOUNTER — Other Ambulatory Visit: Payer: Self-pay

## 2021-03-22 ENCOUNTER — Encounter (HOSPITAL_COMMUNITY)
Admission: RE | Admit: 2021-03-22 | Discharge: 2021-03-22 | Disposition: A | Discharge status: Home / Self Care | Payer: Medicare Other | Source: Ambulatory Visit | Attending: Internal Medicine | Admitting: Internal Medicine

## 2021-03-22 DIAGNOSIS — Z951 Presence of aortocoronary bypass graft: Secondary | ICD-10-CM

## 2021-03-22 NOTE — Progress Notes (Signed)
Daily Session Note  Patient Details  Name: Emilia A Casher MRN: [id number] Date of Birth: 11/09/1945 Referring Provider:   Flowsheet Row CARDIAC REHAB PHASE II ORIENTATION from 01/19/2021 in [place] [facility]  Referring Provider Dr. Ross       Encounter Date: 03/22/2021  Check In:  Session Check In - 03/22/21 0815       Check-In   Supervising physician immediately available to respond to emergencies [facility] MD immediately available    Physician(s) Dr. McDowell    Location [facility]    Staff Present Dalton Fletcher, MS, ACSM-CEP, Exercise Physiologist;Heather Jachimiak, BS, Exercise Physiologist    Virtual Visit No    Medication changes reported     No    Fall or balance concerns reported    No    Tobacco Cessation No Change    Warm-up and Cool-down Performed as group-led instruction    Resistance Training Performed Yes    VAD Patient? No    PAD/SET Patient? No      Pain Assessment   Currently in Pain? No/denies    Pain Score 0-No pain    Multiple Pain Sites No             Capillary Blood Glucose: No results found for this or any previous visit (from the past 24 hour(s)).    Social History   Tobacco Use  Smoking Status Never  Smokeless Tobacco Never    Goals Met:  Independence with exercise equipment Exercise tolerated well No report of concerns or symptoms today Strength training completed today  Goals Unmet:  Not Applicable  Comments: check out 0915   Dr. Jehanzeb Memon is Medical Director for [facility]. 

## 2021-03-24 ENCOUNTER — Encounter (HOSPITAL_COMMUNITY)
Admission: RE | Admit: 2021-03-24 | Discharge: 2021-03-24 | Disposition: A | Discharge status: Home / Self Care | Payer: Medicare Other | Source: Ambulatory Visit | Attending: Internal Medicine | Admitting: Internal Medicine

## 2021-03-24 ENCOUNTER — Other Ambulatory Visit: Payer: Self-pay

## 2021-03-24 DIAGNOSIS — Z951 Presence of aortocoronary bypass graft: Secondary | ICD-10-CM | POA: Diagnosis not present

## 2021-03-24 NOTE — Progress Notes (Signed)
Daily Session Note  Patient Details  Name: Katrina Castro MRN: [id number] Date of Birth: September 15, 1945 Referring Provider:   Flowsheet Row CARDIAC REHAB PHASE II ORIENTATION from 01/19/2021 in [place]  Referring Provider Dr. Harrington Challenger       Encounter Date: 03/24/2021  Check In:  Session Check In - 03/24/21 0815       Check-In   Supervising physician immediately available to respond to emergencies [facility] MD immediately available    Physician(s) Dr. Harl Bowie    Location [facility]    Staff Present Redge Gainer, BS, Exercise Physiologist;Debra Wynetta Emery, RN, BSN    Virtual Visit No    Medication changes reported     No    Fall or balance concerns reported    No    Tobacco Cessation No Change    Warm-up and Cool-down Performed as group-led instruction    Resistance Training Performed Yes    VAD Patient? No    PAD/SET Patient? No      Pain Assessment   Currently in Pain? No/denies    Pain Score 0-No pain    Multiple Pain Sites No             Capillary Blood Glucose: No results found for this or any previous visit (from the past 24 hour(s)).    Social History   Tobacco Use  Smoking Status Never  Smokeless Tobacco Never    Goals Met:  Independence with exercise equipment Exercise tolerated well No report of concerns or symptoms today Strength training completed today  Goals Unmet:  Not Applicable  Comments: check out 0915   Dr. Kathie Dike is Medical Director for [facility].

## 2021-03-27 ENCOUNTER — Other Ambulatory Visit: Payer: Self-pay

## 2021-03-27 ENCOUNTER — Encounter (HOSPITAL_COMMUNITY)
Admission: RE | Admit: 2021-03-27 | Discharge: 2021-03-27 | Disposition: A | Discharge status: Home / Self Care | Payer: Medicare Other | Source: Ambulatory Visit | Attending: Internal Medicine | Admitting: Internal Medicine

## 2021-03-27 VITALS — Wt 152.3 lb

## 2021-03-27 DIAGNOSIS — Z951 Presence of aortocoronary bypass graft: Secondary | ICD-10-CM

## 2021-03-27 NOTE — Progress Notes (Signed)
Daily Session Note  Patient Details  Name: Katrina Castro MRN: [id number] Date of Birth: May 14, 1946 Referring Provider:   Flowsheet Row CARDIAC REHAB PHASE II ORIENTATION from 01/19/2021 in [place]  Referring Provider Dr. Harrington Challenger       Encounter Date: 03/27/2021  Check In:  Session Check In - 03/27/21 0815       Check-In   Supervising physician immediately available to respond to emergencies [facility] MD immediately available    Physician(s) Dr. Gardiner Rhyme    Location [facility]    Staff Present Hoy Register, MS, ACSM-CEP, Exercise Physiologist;Debra Wynetta Emery, RN, BSN    Virtual Visit No    Medication changes reported     No    Fall or balance concerns reported    No    Tobacco Cessation No Change    Warm-up and Cool-down Performed as group-led instruction    Resistance Training Performed Yes    VAD Patient? No    PAD/SET Patient? No      Pain Assessment   Currently in Pain? No/denies    Pain Score 0-No pain    Multiple Pain Sites No             Capillary Blood Glucose: No results found for this or any previous visit (from the past 24 hour(s)).    Social History   Tobacco Use  Smoking Status Never  Smokeless Tobacco Never    Goals Met:  Independence with exercise equipment Exercise tolerated well No report of concerns or symptoms today Strength training completed today  Goals Unmet:  Not Applicable  Comments: checkout time is 0915   Dr. Kathie Dike is Medical Director for [facility].

## 2021-03-28 ENCOUNTER — Telehealth: Payer: Self-pay | Admitting: Internal Medicine

## 2021-03-28 NOTE — Telephone Encounter (Signed)
Spoke with pt who request medication list from our office. List printed and mailed to pt.

## 2021-03-28 NOTE — Telephone Encounter (Signed)
Patient called requesting to speak with someone regarding her medications.  She thinks she is taking too many.

## 2021-03-29 ENCOUNTER — Other Ambulatory Visit: Payer: Self-pay

## 2021-03-29 ENCOUNTER — Encounter (HOSPITAL_COMMUNITY)
Admission: RE | Admit: 2021-03-29 | Discharge: 2021-03-29 | Disposition: A | Discharge status: Home / Self Care | Payer: Medicare Other | Source: Ambulatory Visit | Attending: Internal Medicine | Admitting: Internal Medicine

## 2021-03-29 DIAGNOSIS — Z951 Presence of aortocoronary bypass graft: Secondary | ICD-10-CM

## 2021-03-29 NOTE — Progress Notes (Signed)
Cardiac Individual Treatment Plan  Patient Details  Name: Katrina Castro MRN: [id number] Date of Birth: 1945-11-05 Referring Provider:   Flowsheet Row CARDIAC REHAB PHASE II ORIENTATION from 01/19/2021 in [place]  Referring Provider Dr. Harrington Challenger       Initial Encounter Date:  Flowsheet Row CARDIAC REHAB PHASE II ORIENTATION from 01/19/2021 in [facility]  Date 01/19/21       Visit Diagnosis: S/P CABG x 5  Patient's Home Medications on Admission:  Current Outpatient Medications:    acetaminophen (TYLENOL) 325 MG tablet, Take 325 mg by mouth every 6 (six) hours as needed for headache., Disp: , Rfl:    albuterol (VENTOLIN HFA) 108 (90 Base) MCG/ACT inhaler, Inhale 1 puff into the lungs every 4 (four) hours as needed for wheezing or shortness of breath., Disp: , Rfl:    ALPRAZolam (XANAX) 0.5 MG tablet, Take 0.25 mg by mouth at bedtime as needed for anxiety or sleep., Disp: , Rfl:    aspirin EC 81 MG EC tablet, Take 1 tablet (81 mg total) by mouth daily. Swallow whole., Disp: 30 tablet, Rfl: 11   Biotin w/ Vitamins C & E (HAIR SKIN & NAILS GUMMIES PO), Take 2 capsules by mouth daily., Disp: , Rfl:    Cholecalciferol (VITAMIN D) 50 MCG (2000 UT) tablet, Take 2,000 Units by mouth daily., Disp: , Rfl:    clopidogrel (PLAVIX) 75 MG tablet, Take 1 tablet (75 mg total) by mouth daily., Disp: 90 tablet, Rfl: 3   cyanocobalamin (,VITAMIN B-12,) 1000 MCG/ML injection, Inject 1 mg into the muscle every 14 (fourteen) days., Disp: , Rfl:    Cyanocobalamin (B-12 COMPLIANCE INJECTION IJ), Inject 1 Dose as directed every 14 (fourteen) days., Disp: , Rfl:    ezetimibe (ZETIA) 10 MG tablet, Take 1 tablet (10 mg total) by mouth daily., Disp: 90 tablet, Rfl: 3   ferrous gluconate (FERGON) 324 MG tablet, Take 324 mg by mouth daily., Disp: , Rfl:    furosemide (LASIX) 40 MG tablet, Take 40 mg every THIRD day until your weight is back to baseline, Disp: 30 tablet,  Rfl: 0   glipiZIDE (GLUCOTROL XL) 5 MG 24 hr tablet, Take 5 mg by mouth 2 (two) times daily., Disp: , Rfl:    levocetirizine (XYZAL) 5 MG tablet, Take 5 mg by mouth daily., Disp: , Rfl:    Menthol-Methyl Salicylate (MUSCLE RUB EX), Apply 1 application topically daily as needed (muscle pain). [place] gel, Disp: , Rfl:    metoprolol tartrate (LOPRESSOR) 25 MG tablet, Take 1 tablet (25 mg total) by mouth 2 (two) times daily., Disp: 180 tablet, Rfl: 3   Multiple Vitamins-Minerals (ADULT GUMMY PO), Take 2 capsules by mouth daily., Disp: , Rfl:    nitroGLYCERIN (NITROSTAT) 0.4 MG SL tablet, Place 1 tablet (0.4 mg total) under the tongue every 5 (five) minutes as needed for chest pain., Disp: 25 tablet, Rfl: 3   oxybutynin (DITROPAN-XL) 10 MG 24 hr tablet, Take 10 mg by mouth daily., Disp: , Rfl:    pantoprazole (PROTONIX) 40 MG tablet, Take 40 mg by mouth daily., Disp: , Rfl:    potassium chloride (KLOR-CON) 10 MEQ tablet, Take 10 meq every THIRD day until weight is back to baseline. Take with Lasix, Disp: 30 tablet, Rfl: 0   rosuvastatin (CRESTOR) 40 MG tablet, Take 1 tablet (40 mg total) by mouth daily., Disp: 90 tablet, Rfl: 2   urea (CARMOL) 40 % CREA, Apply 1 application topically daily., Disp:  120 g, Rfl: 2   vitamin B-12 (CYANOCOBALAMIN) 1000 MCG tablet, Take 1,000 mcg by mouth daily., Disp: , Rfl:    Vitamin D, Ergocalciferol, (DRISDOL) 1.25 MG (50000 UNIT) CAPS capsule, Take 50,000 Units by mouth every 14 (fourteen) days., Disp: , Rfl:   Past Medical History: Past Medical History:  Diagnosis Date   Arthritis    CAD (coronary artery disease)    a. s/p CABG on 04/27/2020 with LIMA-LAD, seq SVG-PDA-PL, seq left radial-RI-OM   Concussion    2015   Depression    Diabetes ([place])    type 2   Dysphagia    Dysrhythmia    afib   GERD (gastroesophageal reflux disease)    Headache    History of bronchitis    Hypertension    Seasonal allergies    Toenail fungus     Tobacco Use: Social  History   Tobacco Use  Smoking Status Never  Smokeless Tobacco Never    Labs: Recent Review Flowsheet Data     Labs for ITP Cardiac and Pulmonary Rehab Latest Ref Rng & Units 05/12/2020 05/14/2020 08/31/2020 11/23/2020 01/25/2021   Cholestrol 0 - 200 mg/dL - - 160 207(H) 106   LDLCALC 0 - 99 mg/dL - - 88 108(H) 35   HDL >40 mg/dL - - 41 49 38(L)   Trlycerides <150 mg/dL - 131 156(H) 250(H) 164(H)   Hemoglobin A1c 4.8 - 5.6 % 6.4(H) - - - -   PHART 7.350 - 7.450 - - - - -   PCO2ART 32.0 - 48.0 mmHg - - - - -   HCO3 20.0 - 28.0 mmol/L - - - - -   TCO2 22 - 32 mmol/L - - - - -   ACIDBASEDEF 0.0 - 2.0 mmol/L - - - - -   O2SAT % - - - - -       Capillary Blood Glucose: Lab Results  Component Value Date   GLUCAP 310 (H) 01/30/2021   GLUCAP 219 (H) 01/23/2021   GLUCAP 283 (H) 01/19/2021   GLUCAP 173 (H) 01/17/2021   GLUCAP 187 (H) 05/17/2020    POCT Glucose     Row Name 01/19/21 1325             POCT Blood Glucose   Pre-Exercise 283 mg/dL                Exercise Target Goals: Exercise Program Goal: Individual exercise prescription set using results from initial 6 min walk test and THRR while considering  patient's activity barriers and safety.   Exercise Prescription Goal: Starting with aerobic activity 30 plus minutes a day, 3 days per week for initial exercise prescription. Provide home exercise prescription and guidelines that participant acknowledges understanding prior to discharge.  Activity Barriers & Risk Stratification:  Activity Barriers & Cardiac Risk Stratification - 01/19/21 1320       Activity Barriers & Cardiac Risk Stratification   Activity Barriers Arthritis;Back Problems;Right Knee Replacement;Incisional Pain    Cardiac Risk Stratification High             6 Minute Walk:  6 Minute Walk     Row Name 01/19/21 1552         6 Minute Walk   Phase Initial     Distance 1400 feet     Walk Time 6 minutes     # of Rest Breaks 0     MPH  2.65     METS 2.59  RPE 12     VO2 Peak 9.05     Symptoms No     Resting HR 62 bpm     Resting BP 118/70     Resting Oxygen Saturation  97 %     Exercise Oxygen Saturation  during 6 min walk 97 %     Max Ex. HR 84 bpm     Max Ex. BP 140/72     2 Minute Post BP 106/64              Oxygen Initial Assessment:   Oxygen Re-Evaluation:   Oxygen Discharge (Final Oxygen Re-Evaluation):   Initial Exercise Prescription:  Initial Exercise Prescription - 01/19/21 1500       Date of Initial Exercise RX and Referring Provider   Date 01/19/21    Referring Provider Dr. Harrington Challenger    Expected Discharge Date 04/14/21      Treadmill   MPH 1.5    Grade 0    Minutes 17      NuStep   Level 1    SPM 80    Minutes 22      Prescription Details   Frequency (times per week) 3    Duration Progress to 30 minutes of continuous aerobic without signs/symptoms of physical distress      Intensity   THRR 40-80% of Max Heartrate 58-116    Ratings of Perceived Exertion 11-13    Perceived Dyspnea 0-4      Resistance Training   Training Prescription Yes    Weight 3 lbs    Reps 10-15             Perform Capillary Blood Glucose checks as needed.  Exercise Prescription Changes:   Exercise Prescription Changes     Row Name 01/27/21 0815 02/13/21 0815 02/27/21 0815 03/13/21 0800 03/13/21 0900     Response to Exercise   Blood Pressure (Admit) 94/60 106/60 110/58 -- 120/58   Blood Pressure (Exercise) 122/64 110/50 120/62 -- 148/64   Blood Pressure (Exit) 118/78 118/60 112/58 -- 100/60   Heart Rate (Admit) 62 bpm 76 bpm 68 bpm -- 73 bpm   Heart Rate (Exercise) 81 bpm 95 bpm 82 bpm -- 89 bpm   Heart Rate (Exit) 71 bpm 85 bpm 76 bpm -- 82 bpm   Rating of Perceived Exertion (Exercise) 12 10 12  -- --   Duration Continue with 30 min of aerobic exercise without signs/symptoms of physical distress. Continue with 30 min of aerobic exercise without signs/symptoms of physical distress.  Continue with 30 min of aerobic exercise without signs/symptoms of physical distress. -- Continue with 30 min of aerobic exercise without signs/symptoms of physical distress.   Intensity THRR unchanged THRR unchanged THRR unchanged -- THRR unchanged     Progression   Progression Continue to progress workloads to maintain intensity without signs/symptoms of physical distress. Continue to progress workloads to maintain intensity without signs/symptoms of physical distress. Continue to progress workloads to maintain intensity without signs/symptoms of physical distress. -- Continue to progress workloads to maintain intensity without signs/symptoms of physical distress.     Resistance Training   Training Prescription Yes Yes Yes -- Yes   Weight 3 lbs 3 lbs 3 lbs -- 3   Reps 10-15 10-15 10-15 -- 10-15   Time 10 Minutes 10 Minutes 10 Minutes -- 10 Minutes     Treadmill   MPH 1.7 2.2 2.2 -- 2.2   Grade 0 0 0 -- 0   Minutes 22  22 22 -- 22   METs 2.3 2.69 2.69 -- 2.69     NuStep   Level 1 1 1  -- 1   SPM 56 66 70 -- 74   Minutes 22 17 17  -- 17   METs 1.6 1.6 1.65 -- 1.56     Home Exercise Plan   Plans to continue exercise at -- -- -- Home (comment) --   Frequency -- -- -- Add 2 additional days to program exercise sessions. --   Initial Home Exercises Provided -- -- -- 03/13/21 --    [place] Name 03/27/21 1200             Response to Exercise   Blood Pressure (Admit) 98/56       Blood Pressure (Exercise) 128/56       Blood Pressure (Exit) 96/60       Heart Rate (Admit) 80 bpm       Heart Rate (Exercise) 77 bpm       Heart Rate (Exit) 75 bpm       Rating of Perceived Exertion (Exercise) 12       Duration Continue with 30 min of aerobic exercise without signs/symptoms of physical distress.       Intensity THRR unchanged               Progression   Progression Continue to progress workloads to maintain intensity without signs/symptoms of physical distress.               Resistance  Training   Training Prescription Yes       Weight 3       Reps 10-15       Time 10 Minutes               Treadmill   MPH 2.5       Grade 0       Minutes 17       METs 2.91               NuStep   Level 2       SPM 57       Minutes 22       METs 1.6                Exercise Comments:   Exercise Comments     Row Name 03/13/21 0851           Exercise Comments home exercise plan reviewed                Exercise Goals and Review:   Exercise Goals     Row Name 01/19/21 1556 01/30/21 0956 02/28/21 0929 03/27/21 1301       Exercise Goals   Increase Physical Activity Yes Yes Yes Yes    Intervention Provide advice, education, support and counseling about physical activity/exercise needs.;Develop an individualized exercise prescription for aerobic and resistive training based on initial evaluation findings, risk stratification, comorbidities and participant's personal goals. Provide advice, education, support and counseling about physical activity/exercise needs.;Develop an individualized exercise prescription for aerobic and resistive training based on initial evaluation findings, risk stratification, comorbidities and participant's personal goals. Provide advice, education, support and counseling about physical activity/exercise needs.;Develop an individualized exercise prescription for aerobic and resistive training based on initial evaluation findings, risk stratification, comorbidities and participant's personal goals. Provide advice, education, support and counseling about physical activity/exercise needs.;Develop an individualized exercise prescription for aerobic and resistive training based on initial evaluation findings, risk  stratification, comorbidities and participant's personal goals.    Expected Outcomes Short Term: Attend rehab on a regular basis to increase amount of physical activity.;Long Term: Add in home exercise to make exercise part of routine and to  increase amount of physical activity.;Long Term: Exercising regularly at least 3-5 days a week. Short Term: Attend rehab on a regular basis to increase amount of physical activity.;Long Term: Add in home exercise to make exercise part of routine and to increase amount of physical activity.;Long Term: Exercising regularly at least 3-5 days a week. Short Term: Attend rehab on a regular basis to increase amount of physical activity.;Long Term: Add in home exercise to make exercise part of routine and to increase amount of physical activity.;Long Term: Exercising regularly at least 3-5 days a week. Short Term: Attend rehab on a regular basis to increase amount of physical activity.;Long Term: Add in home exercise to make exercise part of routine and to increase amount of physical activity.;Long Term: Exercising regularly at least 3-5 days a week.    Increase Strength and Stamina Yes Yes Yes Yes    Intervention Provide advice, education, support and counseling about physical activity/exercise needs.;Develop an individualized exercise prescription for aerobic and resistive training based on initial evaluation findings, risk stratification, comorbidities and participant's personal goals. Provide advice, education, support and counseling about physical activity/exercise needs.;Develop an individualized exercise prescription for aerobic and resistive training based on initial evaluation findings, risk stratification, comorbidities and participant's personal goals. Provide advice, education, support and counseling about physical activity/exercise needs.;Develop an individualized exercise prescription for aerobic and resistive training based on initial evaluation findings, risk stratification, comorbidities and participant's personal goals. Provide advice, education, support and counseling about physical activity/exercise needs.;Develop an individualized exercise prescription for aerobic and resistive training based on  initial evaluation findings, risk stratification, comorbidities and participant's personal goals.    Expected Outcomes Short Term: Increase workloads from initial exercise prescription for resistance, speed, and METs.;Short Term: Perform resistance training exercises routinely during rehab and add in resistance training at home;Long Term: Improve cardiorespiratory fitness, muscular endurance and strength as measured by increased METs and functional capacity (6MWT) Short Term: Increase workloads from initial exercise prescription for resistance, speed, and METs.;Short Term: Perform resistance training exercises routinely during rehab and add in resistance training at home;Long Term: Improve cardiorespiratory fitness, muscular endurance and strength as measured by increased METs and functional capacity (6MWT) Short Term: Increase workloads from initial exercise prescription for resistance, speed, and METs.;Short Term: Perform resistance training exercises routinely during rehab and add in resistance training at home;Long Term: Improve cardiorespiratory fitness, muscular endurance and strength as measured by increased METs and functional capacity (6MWT) Short Term: Increase workloads from initial exercise prescription for resistance, speed, and METs.;Short Term: Perform resistance training exercises routinely during rehab and add in resistance training at home;Long Term: Improve cardiorespiratory fitness, muscular endurance and strength as measured by increased METs and functional capacity (6MWT)    Able to understand and use rate of perceived exertion (RPE) scale Yes Yes Yes Yes    Intervention Provide education and explanation on how to use RPE scale Provide education and explanation on how to use RPE scale Provide education and explanation on how to use RPE scale Provide education and explanation on how to use RPE scale    Expected Outcomes Short Term: Able to use RPE daily in rehab to express subjective  intensity level;Long Term:  Able to use RPE to guide intensity level when exercising independently Short Term: Able to  use RPE daily in rehab to express subjective intensity level;Long Term:  Able to use RPE to guide intensity level when exercising independently Short Term: Able to use RPE daily in rehab to express subjective intensity level;Long Term:  Able to use RPE to guide intensity level when exercising independently Short Term: Able to use RPE daily in rehab to express subjective intensity level;Long Term:  Able to use RPE to guide intensity level when exercising independently    Knowledge and understanding of Target Heart Rate Range (THRR) Yes Yes Yes Yes    Intervention Provide education and explanation of THRR including how the numbers were predicted and where they are located for reference Provide education and explanation of THRR including how the numbers were predicted and where they are located for reference Provide education and explanation of THRR including how the numbers were predicted and where they are located for reference Provide education and explanation of THRR including how the numbers were predicted and where they are located for reference    Expected Outcomes Short Term: Able to state/look up THRR;Long Term: Able to use THRR to govern intensity when exercising independently;Short Term: Able to use daily as guideline for intensity in rehab Short Term: Able to state/look up THRR;Long Term: Able to use THRR to govern intensity when exercising independently;Short Term: Able to use daily as guideline for intensity in rehab Short Term: Able to state/look up THRR;Long Term: Able to use THRR to govern intensity when exercising independently;Short Term: Able to use daily as guideline for intensity in rehab Short Term: Able to state/look up THRR;Long Term: Able to use THRR to govern intensity when exercising independently;Short Term: Able to use daily as guideline for intensity in rehab    Able  to check pulse independently Yes Yes Yes Yes    Intervention Provide education and demonstration on how to check pulse in carotid and radial arteries.;Review the importance of being able to check your own pulse for safety during independent exercise Provide education and demonstration on how to check pulse in carotid and radial arteries.;Review the importance of being able to check your own pulse for safety during independent exercise Provide education and demonstration on how to check pulse in carotid and radial arteries.;Review the importance of being able to check your own pulse for safety during independent exercise Provide education and demonstration on how to check pulse in carotid and radial arteries.;Review the importance of being able to check your own pulse for safety during independent exercise    Expected Outcomes Short Term: Able to explain why pulse checking is important during independent exercise;Long Term: Able to check pulse independently and accurately Short Term: Able to explain why pulse checking is important during independent exercise;Long Term: Able to check pulse independently and accurately Short Term: Able to explain why pulse checking is important during independent exercise;Long Term: Able to check pulse independently and accurately Short Term: Able to explain why pulse checking is important during independent exercise;Long Term: Able to check pulse independently and accurately    Understanding of Exercise Prescription Yes Yes Yes Yes    Intervention Provide education, explanation, and written materials on patient's individual exercise prescription Provide education, explanation, and written materials on patient's individual exercise prescription Provide education, explanation, and written materials on patient's individual exercise prescription Provide education, explanation, and written materials on patient's individual exercise prescription    Expected Outcomes Short Term: Able to  explain program exercise prescription;Long Term: Able to explain home exercise prescription to exercise independently Short  Term: Able to explain program exercise prescription;Long Term: Able to explain home exercise prescription to exercise independently Short Term: Able to explain program exercise prescription;Long Term: Able to explain home exercise prescription to exercise independently Short Term: Able to explain program exercise prescription;Long Term: Able to explain home exercise prescription to exercise independently             Exercise Goals Re-Evaluation :  Exercise Goals Re-Evaluation     Row Name 01/30/21 0956 02/28/21 0929 03/27/21 1302         Exercise Goal Re-Evaluation   Exercise Goals Review Increase Physical Activity;Increase Strength and Stamina;Able to understand and use rate of perceived exertion (RPE) scale;Knowledge and understanding of Target Heart Rate Range (THRR);Able to check pulse independently;Understanding of Exercise Prescription Increase Physical Activity;Increase Strength and Stamina;Able to understand and use rate of perceived exertion (RPE) scale;Knowledge and understanding of Target Heart Rate Range (THRR);Able to check pulse independently;Understanding of Exercise Prescription Increase Physical Activity;Increase Strength and Stamina;Able to understand and use rate of perceived exertion (RPE) scale;Knowledge and understanding of Target Heart Rate Range (THRR);Able to check pulse independently;Understanding of Exercise Prescription     Comments Pt has completed 3 sessions of cardiac rehab. Her CBG has been running high as of late, and she was unable to exercise today because it was 310 at her arrival. She sees her PCP tomorrow and will talk about these concerns with him. She also reports that she feels like she has no energy, so Dr. Harrington Challenger has cut back on her metoprolol. She currently exercises at 1.60 METs while on the stepper. Will continue to monitor and  progress as able. Pt has completed 10 sessions of cardiac rehab. Her attendance has been inconsistent as of late. I do not feel as if she pushes herself as much as she could while on the stepper and is easily distracted. She is currently exercising at 1.65 METs on the stepper. Will continue to monitor and progress as able. Pt has completed 21 sessions of cardiac rehab. She seems to be easly distracted during the class and does not seem to be pushing herself while exercising. She complains that the stepper is hard on her legs. She has recently complained the past weeks about fluid on her knee effecting exercise, we have encourged her to speak to her orthopedic. She is currently exerising at 2.91 METs on the TM. Will continue to montior and progress as able.     Expected Outcomes Through exercise at rehab and at home, the patient will meet their stated goals. Through exercise at rehab and at home, the patient will meet their stated goals. Through exercise at rehab and at home, the patient will meet their stated goals.               Discharge Exercise Prescription (Final Exercise Prescription Changes):  Exercise Prescription Changes - 03/27/21 1200       Response to Exercise   Blood Pressure (Admit) 98/56    Blood Pressure (Exercise) 128/56    Blood Pressure (Exit) 96/60    Heart Rate (Admit) 80 bpm    Heart Rate (Exercise) 77 bpm    Heart Rate (Exit) 75 bpm    Rating of Perceived Exertion (Exercise) 12    Duration Continue with 30 min of aerobic exercise without signs/symptoms of physical distress.    Intensity THRR unchanged      Progression   Progression Continue to progress workloads to maintain intensity without signs/symptoms of physical distress.  Resistance Training   Training Prescription Yes    Weight 3    Reps 10-15    Time 10 Minutes      Treadmill   MPH 2.5    Grade 0    Minutes 17    METs 2.91      NuStep   Level 2    SPM 57    Minutes 22    METs 1.6              Nutrition:  Target Goals: Understanding of nutrition guidelines, daily intake of sodium 1500mg , cholesterol 200mg , calories 30% from fat and 7% or less from saturated fats, daily to have 5 or more servings of fruits and vegetables.  Biometrics:  Pre Biometrics - 01/19/21 1557       Pre Biometrics   Height 5' 2.5" (1.588 m)    Weight 70.8 kg    Waist Circumference 43.5 inches    Hip Circumference 40.5 inches    Waist to Hip Ratio 1.07 %    BMI (Calculated) 28.08    Triceps Skinfold 28 mm    % Body Fat 43.1 %    Grip Strength 21.1 kg    Flexibility 0 in    Single Leg Stand 2.56 seconds              Nutrition Therapy Plan and Nutrition Goals:  Nutrition Therapy & Goals - 01/23/21 1011       Personal Nutrition Goals   Comments Patient scored 10 on her diet assessment. We offer 2 educational sessons on heart healthy nutrition with handouts and assistance with RD referral if interested.      Intervention Plan   Intervention Nutrition handout(s) given to patient.             Nutrition Assessments:  Nutrition Assessments - 01/19/21 1317       MEDFICTS Scores   Pre Score 10            MEDIFICTS Score Key: ?70 Need to make dietary changes  40-70 Heart Healthy Diet ? 40 Therapeutic Level Cholesterol Diet   Picture Your Plate Scores: <75 Unhealthy dietary pattern with much room for improvement. 41-50 Dietary pattern unlikely to meet recommendations for good health and room for improvement. 51-60 More healthful dietary pattern, with some room for improvement.  >60 Healthy dietary pattern, although there may be some specific behaviors that could be improved.    Nutrition Goals Re-Evaluation:   Nutrition Goals Discharge (Final Nutrition Goals Re-Evaluation):   Psychosocial: Target Goals: Acknowledge presence or absence of significant depression and/or stress, maximize coping skills, provide positive support system. Participant is able to  verbalize types and ability to use techniques and skills needed for reducing stress and depression.  Initial Review & Psychosocial Screening:  Initial Psych Review & Screening - 01/19/21 1306       Initial Review   Current issues with Current Sleep Concerns      Family Dynamics   Good Support System? No    Strains Intra-family strains    Comments She does not get along well with her daughter. Her daughter does not help her out and is not there for her when needed.      Barriers   Psychosocial barriers to participate in program The patient should benefit from training in stress management and relaxation.      Screening Interventions   Interventions Encouraged to exercise;To provide support and resources with identified psychosocial needs;Provide feedback about the scores to  participant    Expected Outcomes Long Term Goal: Stressors or current issues are controlled or eliminated.;Long Term goal: The participant improves quality of Life and PHQ9 Scores as seen by post scores and/or verbalization of changes             Quality of Life Scores:  Quality of Life - 01/19/21 1308       Quality of Life   Select Quality of Life      Quality of Life Scores   Health/Function Pre 21.59 %    Socioeconomic Pre 26 %    Psych/Spiritual Pre 27.43 %    Family Pre 21 %    GLOBAL Pre 24.05 %            Scores of 19 and below usually indicate a poorer quality of life in these areas.  A difference of  2-3 points is a clinically meaningful difference.  A difference of 2-3 points in the total score of the Quality of Life Index has been associated with significant improvement in overall quality of life, self-image, physical symptoms, and general health in studies assessing change in quality of life.  PHQ-9: Recent Review Flowsheet Data     Depression screen [facility] 2/9 01/19/2021 07/11/2020   Decreased Interest 0 1   Down, Depressed, Hopeless 0 1   PHQ - 2 Score 0 2   Altered sleeping 3 0    Tired, decreased energy 0 0   Change in appetite 3 1   Feeling bad or failure about yourself  0 0   Trouble concentrating 0 0   Moving slowly or fidgety/restless 0 0   Suicidal thoughts 0 0   PHQ-9 Score 6 3   Difficult doing work/chores Not difficult at all Somewhat difficult      Interpretation of Total Score  Total Score Depression Severity:  1-4 = Minimal depression, 5-9 = Mild depression, 10-14 = Moderate depression, 15-19 = Moderately severe depression, 20-27 = Severe depression   Psychosocial Evaluation and Intervention:  Psychosocial Evaluation - 01/19/21 1544       Psychosocial Evaluation & Interventions   Interventions Stress management education;Relaxation education;Encouraged to exercise with the program and follow exercise prescription    Comments Pt has no identifiable barriers to participating in rehab. She has no identifiable psychosocial issues. She does report problems falling asleep and staying asleep; she is prescribed xanax for this, but reports that she does not think that it helps much. She reports that she does not have a good support system. She states that she does not get along well with her daughter, and she feels like her daughter does not support her when she needs her to. Her goals while in the program are to lose 10-15 lbs and decrease her SOB with activity. She wants to get back to where she can dance again, as that is one of her hobbies. She is eager to start the progam.    Expected Outcomes The patient will continue to not have any identifiable psychosocial issues.    Continue Psychosocial Services  No Follow up required             Psychosocial Re-Evaluation:  Psychosocial Re-Evaluation     [place] Name 01/23/21 1013 02/22/21 1022 03/20/21 1252         Psychosocial Re-Evaluation   Current issues with Current Sleep Concerns -- Current Sleep Concerns     Comments Patient is new to the program starting today. She continues to have no psychosocial  barriers identified. She uses alprazolam for sleep which helps most of the time. She demonstrates a very positive outllook and an interest in improving her health. We will continue to monitor her progress. Patient has completed 9 sessions. She continues to have no psychosocial barriers identified. She continues to  use alprazolam for sleep which helps most of the time. She demonstrates a very positive outllook and an interest in improving her health. We will continue to monitor her progress. Patient has completed 18 sessions. She continues to have no psychosocial barriers identified. She continues to  use alprazolam for sleep which helps most of the time. She demonstrates a very positive outllook and an interest in improving her health. We will continue to monitor her progress.     Expected Outcomes Patient will continue to have no psychosocial barriers identified and her sleep concerns will continue to be managed with Alprazolam. Patient will continue to have no psychosocial barriers identified and her sleep concerns will continue to be managed with Alprazolam. Patient will continue to have no psychosocial barriers identified and her sleep concerns will continue to be managed with Alprazolam.     Interventions Stress management education;Encouraged to attend Cardiac Rehabilitation for the exercise;Relaxation education Stress management education;Encouraged to attend Cardiac Rehabilitation for the exercise;Relaxation education Stress management education;Encouraged to attend Cardiac Rehabilitation for the exercise;Relaxation education     Continue Psychosocial Services  No Follow up required No Follow up required No Follow up required              Psychosocial Discharge (Final Psychosocial Re-Evaluation):  Psychosocial Re-Evaluation - 03/20/21 1252       Psychosocial Re-Evaluation   Current issues with Current Sleep Concerns    Comments Patient has completed 18 sessions. She continues to have no  psychosocial barriers identified. She continues to  use alprazolam for sleep which helps most of the time. She demonstrates a very positive outllook and an interest in improving her health. We will continue to monitor her progress.    Expected Outcomes Patient will continue to have no psychosocial barriers identified and her sleep concerns will continue to be managed with Alprazolam.    Interventions Stress management education;Encouraged to attend Cardiac Rehabilitation for the exercise;Relaxation education    Continue Psychosocial Services  No Follow up required             Vocational Rehabilitation: Provide vocational rehab assistance to qualifying candidates.   Vocational Rehab Evaluation & Intervention:  Vocational Rehab - 01/19/21 1312       Initial Vocational Rehab Evaluation & Intervention   Assessment shows need for Vocational Rehabilitation No             Education: Education Goals: Education classes will be provided on a weekly basis, covering required topics. Participant will state understanding/return demonstration of topics presented.  Learning Barriers/Preferences:  Learning Barriers/Preferences - 01/19/21 1312       Learning Barriers/Preferences   Learning Barriers None    Learning Preferences Skilled Demonstration;Individual Instruction             Education Topics: Hypertension, Hypertension Reduction -Define heart disease and high blood pressure. Discus how high blood pressure affects the body and ways to reduce high blood pressure. Flowsheet Row CARDIAC REHAB PHASE II EXERCISE from 03/22/2021 in [place]  Date 03/22/21  Educator [place]  Instruction Review Code 2- Demonstrated Understanding       Exercise and Your Heart -Discuss why it is important to exercise, the FITT principles  of exercise, normal and abnormal responses to exercise, and how to exercise safely.   Angina -Discuss definition of angina, causes of  angina, treatment of angina, and how to decrease risk of having angina.   Cardiac Medications -Review what the following cardiac medications are used for, how they affect the body, and side effects that may occur when taking the medications.  Medications include Aspirin, Beta blockers, calcium channel blockers, ACE Inhibitors, angiotensin receptor blockers, diuretics, digoxin, and antihyperlipidemics.   Congestive Heart Failure -Discuss the definition of CHF, how to live with CHF, the signs and symptoms of CHF, and how keep track of weight and sodium intake.   Heart Disease and Intimacy -Discus the effect sexual activity has on the heart, how changes occur during intimacy as we age, and safety during sexual activity.   Smoking Cessation / COPD -Discuss different methods to quit smoking, the health benefits of quitting smoking, and the definition of COPD. Flowsheet Row CARDIAC REHAB PHASE II EXERCISE from 03/22/2021 in [place]  Date 02/01/21  Educator DF  Instruction Review Code 2- Demonstrated Understanding       Nutrition I: Fats -Discuss the types of cholesterol, what cholesterol does to the heart, and how cholesterol levels can be controlled. Flowsheet Row CARDIAC REHAB PHASE II EXERCISE from 03/22/2021 in [place]  Date 02/08/21  Educator DJ  Instruction Review Code 1- Verbalizes Understanding       Nutrition II: Labels -Discuss the different components of food labels and how to read food label   Heart Parts/Heart Disease and PAD -Discuss the anatomy of the heart, the pathway of blood circulation through the heart, and these are affected by heart disease.   Stress I: Signs and Symptoms -Discuss the causes of stress, how stress may lead to anxiety and depression, and ways to limit stress. Flowsheet Row CARDIAC REHAB PHASE II EXERCISE from 03/22/2021 in [place]  Date 03/08/21  Educator hj   Instruction Review Code 2- Demonstrated Understanding       Stress II: Relaxation -Discuss different types of relaxation techniques to limit stress. Flowsheet Row CARDIAC REHAB PHASE II EXERCISE from 03/22/2021 in [place]  Date 03/15/21  Educator pb  Instruction Review Code 1- Verbalizes Understanding       Warning Signs of Stroke / TIA -Discuss definition of a stroke, what the signs and symptoms are of a stroke, and how to identify when someone is having stroke.   Knowledge Questionnaire Score:  Knowledge Questionnaire Score - 01/19/21 1309       Knowledge Questionnaire Score   Pre Score 16/24             Core Components/Risk Factors/Patient Goals at Admission:  Personal Goals and Risk Factors at Admission - 01/19/21 1312       Core Components/Risk Factors/Patient Goals on Admission    Weight Management Yes;Weight Loss    Intervention Weight Management: Develop a combined nutrition and exercise program designed to reach desired caloric intake, while maintaining appropriate intake of nutrient and fiber, sodium and fats, and appropriate energy expenditure required for the weight goal.;Weight Management: Provide education and appropriate resources to help participant work on and attain dietary goals.;Weight Management/Obesity: Establish reasonable short term and long term weight goals.;Obesity: Provide education and appropriate resources to help participant work on and attain dietary goals.    Expected Outcomes Short Term: Continue to assess and modify interventions until short term weight is achieved;Long Term: Adherence to  nutrition and physical activity/exercise program aimed toward attainment of established weight goal;Weight Maintenance: Understanding of the daily nutrition guidelines, which includes 25-35% calories from fat, 7% or less cal from saturated fats, less than 200mg  cholesterol, less than 1.5gm of sodium, & 5 or more servings of fruits  and vegetables daily;Weight Loss: Understanding of general recommendations for a balanced deficit meal plan, which promotes 1-2 lb weight loss per week and includes a negative energy balance of [id number] kcal/d;Understanding recommendations for meals to include 15-35% energy as protein, 25-35% energy from fat, 35-60% energy from carbohydrates, less than 200mg  of dietary cholesterol, 20-35 gm of total fiber daily;Understanding of distribution of calorie intake throughout the day with the consumption of 4-5 meals/snacks    Improve shortness of breath with ADL's Yes    Intervention Provide education, individualized exercise plan and daily activity instruction to help decrease symptoms of SOB with activities of daily living.    Expected Outcomes Short Term: Improve cardiorespiratory fitness to achieve a reduction of symptoms when performing ADLs;Long Term: Be able to perform more ADLs without symptoms or delay the onset of symptoms    Diabetes Yes    Intervention Provide education about signs/symptoms and action to take for hypo/hyperglycemia.;Provide education about proper nutrition, including hydration, and aerobic/resistive exercise prescription along with prescribed medications to achieve blood glucose in normal ranges: Fasting glucose 65-99 mg/dL    Expected Outcomes Short Term: Participant verbalizes understanding of the signs/symptoms and immediate care of hyper/hypoglycemia, proper foot care and importance of medication, aerobic/resistive exercise and nutrition plan for blood glucose control.;Long Term: Attainment of HbA1C < 7%.    Hypertension Yes    Intervention Provide education on lifestyle modifcations including regular physical activity/exercise, weight management, moderate sodium restriction and increased consumption of fresh fruit, vegetables, and low fat dairy, alcohol moderation, and smoking cessation.;Monitor prescription use compliance.    Expected Outcomes Short Term: Continued assessment  and intervention until BP is < 140/62mm HG in hypertensive participants. < 130/64mm HG in hypertensive participants with diabetes, heart failure or chronic kidney disease.;Long Term: Maintenance of blood pressure at goal levels.             Core Components/Risk Factors/Patient Goals Review:   Goals and Risk Factor Review     Row Name 01/23/21 1017 02/22/21 1022 03/20/21 1252         Core Components/Risk Factors/Patient Goals Review   Personal Goals Review Weight Management/Obesity;Diabetes;Other -- Weight Management/Obesity;Diabetes;Other     Review Patient was referred to CR with [facility]. She has completed 1 sessions. Her DM is managed with Glipizide with her last A1C on file 05/12/21 at 6.4% which has trended down from 7.5%. Her personal goals for the program are to get stronger; exercise more; lose 10-15 lbs; and eat better. We will continue to monitor her progress as she works towards meeting these goals. Patient has completed 9 sessions maintaining her weight since her initial visit. Her attendance has not been consistent due to chronic GI distress which she blames on starting Metformin. She say her cardiologist 8/2. She decreased her metoprolol to 25 mg daily due to complaints of fatigue. She has a BMET and MD added lasix 40 mg with KCL every 3rd day for increased weight and she will continue until her weight is stable per MD. No recent A1C on file. Patient's personal goals for the program continue to be to get stronger; exerce more; lose 10 to 15 lbs and eat healthier. We will continue to monitor her progress as  he works towards meeting these goals. Patient has completed 18 sessionslosing 2 lbs since her last 30 day review. She is doing well in the program with more consistent attendance. She has not been progressed due to complaints of fatigue often. She had an echocardiagram 9/2 with normal LV function with EF 60-65% and her RV showed mild dysfunction but was unchanged from previous echo per  Dr. Alan Ripper note.  No recent A1C on file. Patient's personal goals for the program continue to be to get stronger; exerce more; lose 10 to 15 lbs and eat healthier. We will continue to monitor her progress as he works towards meeting these goals.     Expected Outcomes Patient will complete the program meeting both personal and program goals. Patient will complete the program meeting both personal and program goals. Patient will complete the program meeting both personal and program goals.              Core Components/Risk Factors/Patient Goals at Discharge (Final Review):   Goals and Risk Factor Review - 03/20/21 1252       Core Components/Risk Factors/Patient Goals Review   Personal Goals Review Weight Management/Obesity;Diabetes;Other    Review Patient has completed 18 sessionslosing 2 lbs since her last 30 day review. She is doing well in the program with more consistent attendance. She has not been progressed due to complaints of fatigue often. She had an echocardiagram 9/2 with normal LV function with EF 60-65% and her RV showed mild dysfunction but was unchanged from previous echo per Dr. Alan Ripper note.  No recent A1C on file. Patient's personal goals for the program continue to be to get stronger; exerce more; lose 10 to 15 lbs and eat healthier. We will continue to monitor her progress as he works towards meeting these goals.    Expected Outcomes Patient will complete the program meeting both personal and program goals.             ITP Comments:   Comments: ITP REVIEW Pt is making expected progress toward Cardiac Rehab goals after completing 22 sessions. Recommend continued exercise, life style modification, education, and increased stamina and strength.

## 2021-03-29 NOTE — Progress Notes (Signed)
Daily Session Note  Patient Details  Name: Katrina Castro MRN: [id number] Date of Birth: Jul 15, 1945 Referring Provider:   Flowsheet Row CARDIAC REHAB PHASE II ORIENTATION from 01/19/2021 in [place]  Referring Provider Dr. Harrington Challenger       Encounter Date: 03/29/2021  Check In:  Session Check In - 03/29/21 0815       Check-In   Supervising physician immediately available to respond to emergencies [facility] MD immediately available    Physician(s) Dr. Audie Box    Location [facility]    Staff Present Hoy Register, MS, ACSM-CEP, Exercise Physiologist;Debra Wynetta Emery, RN, BSN    Virtual Visit No    Medication changes reported     No    Fall or balance concerns reported    No    Tobacco Cessation No Change    Warm-up and Cool-down Performed as group-led instruction    Resistance Training Performed Yes    VAD Patient? No    PAD/SET Patient? No      Pain Assessment   Currently in Pain? No/denies    Pain Score 0-No pain    Multiple Pain Sites No             Capillary Blood Glucose: No results found for this or any previous visit (from the past 24 hour(s)).    Social History   Tobacco Use  Smoking Status Never  Smokeless Tobacco Never    Goals Met:  Independence with exercise equipment Exercise tolerated well No report of concerns or symptoms today Strength training completed today  Goals Unmet:  Not Applicable  Comments: checkout time is 0915   Dr. Kathie Dike is Medical Director for [facility] Pulmonary Rehab.

## 2021-03-31 ENCOUNTER — Encounter (HOSPITAL_COMMUNITY): Payer: Medicare Other

## 2021-03-31 ENCOUNTER — Telehealth: Payer: Self-pay | Admitting: *Deleted

## 2021-03-31 DIAGNOSIS — E1159 Type 2 diabetes mellitus with other circulatory complications: Secondary | ICD-10-CM

## 2021-03-31 NOTE — Telephone Encounter (Signed)
Spoke with pt and daughter who states that the PCP told them that being on Crestor and Zetia may be making her blood sugar fluctuate. Pt has been having dizzy spells for the last 2 weeks.

## 2021-04-03 ENCOUNTER — Encounter (HOSPITAL_COMMUNITY)
Admission: RE | Admit: 2021-04-03 | Discharge: 2021-04-03 | Disposition: A | Discharge status: Home / Self Care | Payer: Medicare Other | Source: Ambulatory Visit | Attending: Internal Medicine | Admitting: Internal Medicine

## 2021-04-03 DIAGNOSIS — Z951 Presence of aortocoronary bypass graft: Secondary | ICD-10-CM | POA: Diagnosis not present

## 2021-04-03 NOTE — Progress Notes (Signed)
Daily Session Note  Patient Details  Name: Katrina Castro MRN: [id number] Date of Birth: 1945/09/17 Referring Provider:   Flowsheet Row CARDIAC REHAB PHASE II ORIENTATION from 01/19/2021 in [place]  Referring Provider Dr. Harrington Challenger       Encounter Date: 04/03/2021  Check In:  Session Check In - 04/03/21 0830       Check-In   Supervising physician immediately available to respond to emergencies [facility] MD immediately available    Physician(s) Dr. Audie Box    Location [facility]    Staff Present Hoy Register, MS, ACSM-CEP, Exercise Physiologist;Debra Wynetta Emery, RN, BSN    Virtual Visit No    Medication changes reported     No    Fall or balance concerns reported    No    Tobacco Cessation No Change    Warm-up and Cool-down Performed as group-led instruction    Resistance Training Performed Yes    VAD Patient? No    PAD/SET Patient? No      Pain Assessment   Currently in Pain? No/denies    Pain Score 0-No pain    Multiple Pain Sites No             Capillary Blood Glucose: No results found for this or any previous visit (from the past 24 hour(s)).    Social History   Tobacco Use  Smoking Status Never  Smokeless Tobacco Never    Goals Met:  Independence with exercise equipment Exercise tolerated well No report of concerns or symptoms today Strength training completed today  Goals Unmet:  Not Applicable  Comments: checkout time is 0915   Dr. Kathie Dike is Medical Director for [facility] Pulmonary Rehab.

## 2021-04-04 ENCOUNTER — Ambulatory Visit: Payer: Medicare Other | Admitting: Family Medicine

## 2021-04-05 ENCOUNTER — Encounter (HOSPITAL_COMMUNITY)
Admission: RE | Admit: 2021-04-05 | Discharge: 2021-04-05 | Disposition: A | Discharge status: Home / Self Care | Payer: Medicare Other | Source: Ambulatory Visit | Attending: Internal Medicine | Admitting: Internal Medicine

## 2021-04-05 DIAGNOSIS — E1165 Type 2 diabetes mellitus with hyperglycemia: Secondary | ICD-10-CM | POA: Diagnosis not present

## 2021-04-05 DIAGNOSIS — Z951 Presence of aortocoronary bypass graft: Secondary | ICD-10-CM | POA: Diagnosis not present

## 2021-04-05 DIAGNOSIS — Z299 Encounter for prophylactic measures, unspecified: Secondary | ICD-10-CM | POA: Diagnosis not present

## 2021-04-05 DIAGNOSIS — I10 Essential (primary) hypertension: Secondary | ICD-10-CM | POA: Diagnosis not present

## 2021-04-05 DIAGNOSIS — M542 Cervicalgia: Secondary | ICD-10-CM | POA: Diagnosis not present

## 2021-04-05 NOTE — Progress Notes (Signed)
Daily Session Note  Patient Details  Name: Katrina Castro MRN: [id number] Date of Birth: Nov 23, 1945 Referring Provider:   Flowsheet Row CARDIAC REHAB PHASE II ORIENTATION from 01/19/2021 in [place]  Referring Provider Dr. Harrington Challenger       Encounter Date: 04/05/2021  Check In:  Session Check In - 04/05/21 0815       Check-In   Supervising physician immediately available to respond to emergencies [facility] MD immediately available    Physician(s) Dr. Domenic Polite    Location [facility]    Staff Present Hoy Register, MS, ACSM-CEP, Exercise Physiologist;Debra Wynetta Emery, RN, BSN;Heather Otho Ket, BS, Exercise Physiologist    Virtual Visit No    Medication changes reported     No    Fall or balance concerns reported    No    Tobacco Cessation No Change    Warm-up and Cool-down Performed as group-led instruction    Resistance Training Performed Yes    VAD Patient? No    PAD/SET Patient? No      Pain Assessment   Currently in Pain? No/denies    Pain Score 0-No pain    Multiple Pain Sites No             Capillary Blood Glucose: No results found for this or any previous visit (from the past 24 hour(s)).    Social History   Tobacco Use  Smoking Status Never  Smokeless Tobacco Never    Goals Met:  Independence with exercise equipment Exercise tolerated well No report of concerns or symptoms today Strength training completed today  Goals Unmet:  Not Applicable  Comments: checkout time is 0915   Dr. Kathie Dike is Medical Director for [facility] Pulmonary Rehab.

## 2021-04-06 NOTE — Telephone Encounter (Signed)
Pt seen by Dr Vito Backers that Crestor and Zetia can rise glucose  He recently added another med for BP Recomm:  Stay on Crestor and Zetia    Please get appappt with endocrinology in [place]

## 2021-04-07 ENCOUNTER — Encounter (HOSPITAL_COMMUNITY): Payer: Medicare Other

## 2021-04-07 NOTE — Addendum Note (Signed)
Addended by: Christella Scheuermann C on: 04/07/2021 09:03 AM   Modules accepted: Orders

## 2021-04-07 NOTE — Telephone Encounter (Signed)
Spoke to pt who verbalized understanding. Pt would like to see Endocrinologist in [place]. Referral to [facility] entered.

## 2021-04-10 ENCOUNTER — Encounter (HOSPITAL_COMMUNITY)
Admission: RE | Admit: 2021-04-10 | Discharge: 2021-04-10 | Disposition: A | Discharge status: Home / Self Care | Payer: Medicare Other | Source: Ambulatory Visit | Attending: Internal Medicine | Admitting: Internal Medicine

## 2021-04-10 VITALS — Wt 150.8 lb

## 2021-04-10 DIAGNOSIS — Z951 Presence of aortocoronary bypass graft: Secondary | ICD-10-CM | POA: Diagnosis not present

## 2021-04-10 DIAGNOSIS — E538 Deficiency of other specified B group vitamins: Secondary | ICD-10-CM | POA: Diagnosis not present

## 2021-04-10 NOTE — Progress Notes (Signed)
Daily Session Note  Patient Details  Name: Katrina Castro MRN: [id number] Date of Birth: May 06, 1946 Referring Provider:   Flowsheet Row CARDIAC REHAB PHASE II ORIENTATION from 01/19/2021 in [place]  Referring Provider Dr. Harrington Challenger       Encounter Date: 04/10/2021  Check In:  Session Check In - 04/10/21 0815       Check-In   Supervising physician immediately available to respond to emergencies [facility] MD immediately available    Physician(s) Dr. Harrington Challenger     Location [facility]    Staff Present Geanie Cooley, RN;Dalton Fletcher, MS, ACSM-CEP, Exercise Physiologist    Virtual Visit No    Medication changes reported     No    Fall or balance concerns reported    No    Tobacco Cessation No Change    Warm-up and Cool-down Performed as group-led instruction    Resistance Training Performed Yes    VAD Patient? No    PAD/SET Patient? No      Pain Assessment   Currently in Pain? No/denies    Pain Score 0-No pain    Multiple Pain Sites No             Capillary Blood Glucose: No results found for this or any previous visit (from the past 24 hour(s)).    Social History   Tobacco Use  Smoking Status Never  Smokeless Tobacco Never    Goals Met:  Independence with exercise equipment Exercise tolerated well No report of concerns or symptoms today Strength training completed today  Goals Unmet:  Not Applicable  Comments: check out @ 9:15am   Dr. Kathie Dike is Medical Director for [facility].

## 2021-04-11 ENCOUNTER — Ambulatory Visit (INDEPENDENT_AMBULATORY_CARE_PROVIDER_SITE_OTHER): Payer: Medicare Other | Admitting: Internal Medicine

## 2021-04-12 ENCOUNTER — Encounter (HOSPITAL_COMMUNITY)
Admission: RE | Admit: 2021-04-12 | Discharge: 2021-04-12 | Disposition: A | Discharge status: Home / Self Care | Payer: Medicare Other | Source: Ambulatory Visit | Attending: Internal Medicine | Admitting: Internal Medicine

## 2021-04-12 VITALS — Ht 62.5 in | Wt 149.5 lb

## 2021-04-12 DIAGNOSIS — Z951 Presence of aortocoronary bypass graft: Secondary | ICD-10-CM

## 2021-04-12 NOTE — Progress Notes (Signed)
Daily Session Note  Patient Details  Name: Katrina Castro MRN: [id number] Date of Birth: 08/19/1945 Referring Provider:   Flowsheet Row CARDIAC REHAB PHASE II ORIENTATION from 01/19/2021 in [place] [facility]  Referring Provider Dr. Ross       Encounter Date: 04/12/2021  Check In:  Session Check In - 04/12/21 0815       Check-In   Supervising physician immediately available to respond to emergencies [facility] MD immediately available    Physician(s) Dr. Branch    Location [facility]    Staff Present Phyllis Billingsley, RN;Dalton Fletcher, MS, ACSM-CEP, Exercise Physiologist;Heather Jachimiak, BS, Exercise Physiologist    Virtual Visit No    Medication changes reported     No    Fall or balance concerns reported    No    Tobacco Cessation No Change    Warm-up and Cool-down Performed as group-led instruction    Resistance Training Performed Yes    VAD Patient? No    PAD/SET Patient? No      Pain Assessment   Currently in Pain? No/denies    Pain Score 0-No pain    Multiple Pain Sites No             Capillary Blood Glucose: No results found for this or any previous visit (from the past 24 hour(s)).    Social History   Tobacco Use  Smoking Status Never  Smokeless Tobacco Never    Goals Met:  Independence with exercise equipment Exercise tolerated well No report of concerns or symptoms today Strength training completed today  Goals Unmet:  Not Applicable  Comments: check out @ 9:15am   Dr. Jehanzeb Memon is Medical Director for [facility]. 

## 2021-04-13 ENCOUNTER — Ambulatory Visit (INDEPENDENT_AMBULATORY_CARE_PROVIDER_SITE_OTHER): Payer: Medicare Other | Admitting: Student

## 2021-04-13 ENCOUNTER — Other Ambulatory Visit: Payer: Self-pay

## 2021-04-13 ENCOUNTER — Encounter: Payer: Self-pay | Admitting: Student

## 2021-04-13 VITALS — BP 118/58 | HR 70 | Ht 62.5 in | Wt 152.0 lb

## 2021-04-13 DIAGNOSIS — I10 Essential (primary) hypertension: Secondary | ICD-10-CM

## 2021-04-13 DIAGNOSIS — I2510 Atherosclerotic heart disease of native coronary artery without angina pectoris: Secondary | ICD-10-CM

## 2021-04-13 DIAGNOSIS — E785 Hyperlipidemia, unspecified: Secondary | ICD-10-CM | POA: Diagnosis not present

## 2021-04-13 DIAGNOSIS — R600 Localized edema: Secondary | ICD-10-CM | POA: Diagnosis not present

## 2021-04-13 MED ORDER — METOPROLOL TARTRATE 25 MG PO TABS: 12.5000 mg | ORAL_TABLET | Freq: Two times a day (BID) | ORAL | 3 refills | Status: DC

## 2021-04-13 NOTE — Progress Notes (Signed)
Cardiology Office Note    Date:  04/13/2021   ID:  Katrina Castro, DOB 1946/04/05, MRN [id number]  PCP:  Glenda Chroman, MD  Cardiologist: Dorris Carnes, MD    Chief Complaint  Patient presents with   Follow-up    Recent dizziness     History of Present Illness:    Katrina Castro is a 75 y.o. female with past medical history of CAD (s/p CABG on 04/27/2020 with LIMA-LAD, seq SVG-PDA-PL, seq left radial-RI-OM), HTN, HLD, and Type 2 DM who presents to the office today for evaluation of dizziness and medication management.   She was examined by Dr. Harrington Challenger in 01/2021 and reported still having intermittent chest pain which had been occurring since CABG and was tender on palpation and pain was worse with deep breathing. Her pain was overall felt to be muscular in etiology and a follow-up echocardiogram was recommended given her fatigue and dyspnea. Repeat echocardiogram did show her EF was preserved at 60 to 65% with no wall motion abnormalities. RV function was mildly reduced and there were no significant valve abnormalities. Her BNP was mildly elevated to 144 and she was given an Rx for Lasix 40 mg to take every third day until weight returned to baseline then discontinue.   She did call the office earlier this month reporting dizziness and her PCP had added another medication for blood pressure control. A follow-up visit was arranged.  In talking with the patient today, she reports still having dizziness and fatigue. Dizziness is typically worse with positional changes. She reports being started on a new BP medication by her PCP but we did call her pharmacy to verify and the only thing they have on file for her that impacts BP is Lopressor. She continues to have tenderness to palpation along her sternal region but no exertional chest pain. Breathing has been stable and no orthopnea or PND. No recurrent edema and she has not had to utilize Lasix in almost 2 weeks.    Past Medical History:   Diagnosis Date   Arthritis    CAD (coronary artery disease)    a. s/p CABG on 04/27/2020 with LIMA-LAD, seq SVG-PDA-PL, seq left radial-RI-OM   Concussion    2015   Depression    Diabetes ([place])    type 2   Dysphagia    Dysrhythmia    afib   GERD (gastroesophageal reflux disease)    Headache    History of bronchitis    Hypertension    Seasonal allergies    Toenail fungus     Past Surgical History:  Procedure Laterality Date   APPENDECTOMY     BACK SURGERY     x2   BOTOX INJECTION N/A 06/06/2016   Procedure: BOTOX INJECTION;  Surgeon: Rogene Houston, MD;  [facility];  Service: Endoscopy;  Laterality: N/A;   BOTOX INJECTION N/A 01/17/2021   Procedure: BOTOX INJECTION;  Surgeon: Harvel Quale, MD;  [facility];  Service: Gastroenterology;  Laterality: N/A;   CHOLECYSTECTOMY     COLONOSCOPY N/A 11/05/2012   Procedure: COLONOSCOPY;  Surgeon: Rogene Houston, MD;  [facility];  Service: Endoscopy;  Laterality: N/A;  1030   COLONOSCOPY WITH PROPOFOL N/A 05/01/2019   Procedure: COLONOSCOPY WITH PROPOFOL;  Surgeon: Rogene Houston, MD;  [facility];  Service: Endoscopy;  Laterality: N/A;  11:20am   CORONARY ARTERY BYPASS GRAFT N/A 04/27/2020   Procedure: CORONARY ARTERY BYPASS GRAFTING (  CABG) TIMES FIVE USING LEFT INTERNAL MAMMARY ARTERY, LEFT HARVESTED RADIAL ARTERY, RIGHT GREATER SAPHENOUS VEIN HARVESTED ENDOSCOPICALLY.;  Surgeon: [name], MD;  Location: [place];  Service: Open Heart Surgery;  Laterality: N/A;   ESOPHAGEAL DILATION N/A 07/06/2015   Procedure: ESOPHAGEAL DILATION;  Surgeon: Rogene Houston, MD;  [facility];  Service: Endoscopy;  Laterality: N/A;   ESOPHAGEAL DILATION N/A 06/06/2016   Procedure: ESOPHAGEAL DILATION;  Surgeon: Rogene Houston, MD;  [facility];  Service: Endoscopy;  Laterality: N/A;   ESOPHAGOGASTRODUODENOSCOPY N/A 02/12/2014   Procedure:  ESOPHAGOGASTRODUODENOSCOPY (EGD);  Surgeon: Rogene Houston, MD;  [facility];  Service: Endoscopy;  Laterality: N/A;  150   ESOPHAGOGASTRODUODENOSCOPY N/A 07/06/2015   Procedure: ESOPHAGOGASTRODUODENOSCOPY (EGD);  Surgeon: Rogene Houston, MD;  [facility];  Service: Endoscopy;  Laterality: N/A;  1:25 - moved to 1/18 @ 10:30 - Ann to notify pt   ESOPHAGOGASTRODUODENOSCOPY (EGD) WITH ESOPHAGEAL DILATION N/A 07/25/2012   Procedure: ESOPHAGOGASTRODUODENOSCOPY (EGD) WITH ESOPHAGEAL DILATION;  Surgeon: Rogene Houston, MD;  [facility];  Service: Endoscopy;  Laterality: N/A;  325-rescheduled to [place] notified pt   ESOPHAGOGASTRODUODENOSCOPY (EGD) WITH PROPOFOL N/A 06/06/2016   Procedure: ESOPHAGOGASTRODUODENOSCOPY (EGD) WITH PROPOFOL;  Surgeon: Rogene Houston, MD;  [facility];  Service: Endoscopy;  Laterality: N/A;   ESOPHAGOGASTRODUODENOSCOPY (EGD) WITH PROPOFOL N/A 01/17/2021   Procedure: ESOPHAGOGASTRODUODENOSCOPY (EGD) WITH PROPOFOL;  Surgeon: Harvel Quale, MD;  [facility];  Service: Gastroenterology;  Laterality: N/A;  10:35   EYE SURGERY     cataracts removed   Foot surgeries Bilateral    hammer toes   LEFT HEART CATH AND CORONARY ANGIOGRAPHY N/A 04/26/2020   Procedure: LEFT HEART CATH AND CORONARY ANGIOGRAPHY;  Surgeon: Martinique, Peter M, MD;  Location: [place] [facility];  Service: Cardiovascular;  Laterality: N/A;   MALONEY DILATION N/A 02/12/2014   Procedure: Venia Minks DILATION;  Surgeon: Rogene Houston, MD;  [facility];  Service: Endoscopy;  Laterality: N/A;   POLYPECTOMY  05/01/2019   Procedure: POLYPECTOMY;  Surgeon: Rogene Houston, MD;  [facility];  Service: Endoscopy;;  colon    RADIAL ARTERY HARVEST Left 04/27/2020   Procedure: LEFT RADIAL ARTERY HARVEST;  Surgeon: [name], MD;  Location: [place];  Service: Open Heart Surgery;  Laterality: Left;   Right knee arthroscopy     x2    TEE WITHOUT CARDIOVERSION N/A 04/27/2020   Procedure: TRANSESOPHAGEAL ECHOCARDIOGRAM (TEE);  Surgeon: [name], MD;  Location: [place];  Service: Open Heart Surgery;  Laterality: N/A;   TONSILLECTOMY     TOTAL ABDOMINAL HYSTERECTOMY     TOTAL KNEE ARTHROPLASTY Right 04/03/2017   Procedure: RIGHT TOTAL KNEE ARTHROPLASTY;  Surgeon: Latanya Maudlin, MD;  Location: [facility];  Service: Orthopedics;  Laterality: Right;    Current Medications: Outpatient Medications Prior to Visit  Medication Sig Dispense Refill   acetaminophen (TYLENOL) 325 MG tablet Take 325 mg by mouth every 6 (six) hours as needed for headache.     albuterol (VENTOLIN HFA) 108 (90 Base) MCG/ACT inhaler Inhale 1 puff into the lungs every 4 (four) hours as needed for wheezing or shortness of breath.     ALPRAZolam (XANAX) 0.5 MG tablet Take 0.25 mg by mouth at bedtime as needed for anxiety or sleep.     aspirin EC 81 MG EC tablet Take 1 tablet (81 mg total) by mouth  daily. Swallow whole. 30 tablet 11   Biotin w/ Vitamins C & E (HAIR SKIN & NAILS GUMMIES PO) Take 2 capsules by mouth daily.     Cholecalciferol (VITAMIN D) 50 MCG (2000 UT) tablet Take 2,000 Units by mouth daily.     clopidogrel (PLAVIX) 75 MG tablet Take 1 tablet (75 mg total) by mouth daily. 90 tablet 3   cyanocobalamin (,VITAMIN B-12,) 1000 MCG/ML injection Inject 1 mg into the muscle every 14 (fourteen) days.     ezetimibe (ZETIA) 10 MG tablet Take 1 tablet (10 mg total) by mouth daily. 90 tablet 3   ferrous gluconate (FERGON) 324 MG tablet Take 324 mg by mouth daily.     furosemide (LASIX) 40 MG tablet Take 40 mg every THIRD day until your weight is back to baseline 30 tablet 0   glipiZIDE (GLUCOTROL XL) 5 MG 24 hr tablet Take 5 mg by mouth 2 (two) times daily.     Multiple Vitamins-Minerals (ADULT GUMMY PO) Take 2 capsules by mouth daily.     pantoprazole (PROTONIX) 40 MG tablet Take 40 mg by mouth daily.     potassium chloride (KLOR-CON) 10 MEQ tablet  Take 10 meq every THIRD day until weight is back to baseline. Take with Lasix 30 tablet 0   rosuvastatin (CRESTOR) 40 MG tablet Take 1 tablet (40 mg total) by mouth daily. 90 tablet 2   urea (CARMOL) 40 % CREA Apply 1 application topically daily. 120 g 2   metoprolol tartrate (LOPRESSOR) 25 MG tablet Take 1 tablet (25 mg total) by mouth 2 (two) times daily. 180 tablet 3   Cyanocobalamin (B-12 COMPLIANCE INJECTION IJ) Inject 1 Dose as directed every 14 (fourteen) days.     levocetirizine (XYZAL) 5 MG tablet Take 5 mg by mouth daily.     Menthol-Methyl Salicylate (MUSCLE RUB EX) Apply 1 application topically daily as needed (muscle pain). [place] gel     nitroGLYCERIN (NITROSTAT) 0.4 MG SL tablet Place 1 tablet (0.4 mg total) under the tongue every 5 (five) minutes as needed for chest pain. 25 tablet 3   oxybutynin (DITROPAN-XL) 10 MG 24 hr tablet Take 10 mg by mouth daily.     vitamin B-12 (CYANOCOBALAMIN) 1000 MCG tablet Take 1,000 mcg by mouth daily. (Patient not taking: Reported on 04/13/2021)     Vitamin D, Ergocalciferol, (DRISDOL) 1.25 MG (50000 UNIT) CAPS capsule Take 50,000 Units by mouth every 14 (fourteen) days. (Patient not taking: Reported on 04/13/2021)     No facility-administered medications prior to visit.     Allergies:   Levofloxacin, Cardizem [diltiazem], Sulfa antibiotics, Diazepam, and Lipitor [atorvastatin]   Social History   Socioeconomic History   Marital status: Divorced    Spouse name: Not on file   Number of children: Not on file   Years of education: Not on file   Highest education level: Not on file  Occupational History   Not on file  Tobacco Use   Smoking status: Never   Smokeless tobacco: Never  Vaping Use   Vaping Use: Never used  Substance and Sexual Activity   Alcohol use: No    Alcohol/week: 0.0 standard drinks    Comment: socially    Drug use: No   Sexual activity: Not Currently    Partners: Male    Birth control/protection: None  Other Topics  Concern   Not on file  Social History Narrative   Not on file   Social Determinants of Health   Financial  Resource Strain: Not on file  Food Insecurity: Not on file  Transportation Needs: Not on file  Physical Activity: Not on file  Stress: Not on file  Social Connections: Not on file     Family History:  The patient's family history includes Diabetes in her mother and sister; Stroke in her father.   Review of Systems:    Please see the history of present illness.     All other systems reviewed and are otherwise negative except as noted above.   Physical Exam:    VS:  BP (!) 118/58   Pulse 70   Ht 5' 2.5" (1.588 m)   Wt 152 lb (68.9 kg)   SpO2 95%   BMI 27.36 kg/m    General: Well developed, well nourished,female appearing in no acute distress. Head: Normocephalic, atraumatic. Neck: No carotid bruits. JVD not elevated.  Lungs: Respirations regular and unlabored, without wheezes or rales.  Heart: Regular rate and rhythm. No S3 or S4.  No murmur, no rubs, or gallops appreciated. Abdomen: Appears non-distended. No obvious abdominal masses. Msk:  Strength and tone appear normal for age. No obvious joint deformities or effusions. Extremities: No clubbing or cyanosis. No pitting edema.  Distal pedal pulses are 2+ bilaterally. Neuro: Alert and oriented X 3. Moves all extremities spontaneously. No focal deficits noted. Psych:  Responds to questions appropriately with a normal affect. Skin: No rashes or lesions noted  Wt Readings from Last 3 Encounters:  04/13/21 152 lb (68.9 kg)  04/12/21 149 lb 7.6 oz (67.8 kg)  04/10/21 150 lb 12.7 oz (68.4 kg)     Studies/Labs Reviewed:   EKG:  EKG is not ordered today.   Recent Labs: 05/12/2020: Magnesium 2.2 08/31/2020: ALT 18 01/25/2021: B Natriuretic Peptide 144.0; BUN 13; Creatinine, Ser 0.69; Hemoglobin 12.5; Platelets 296; Potassium 4.2; Sodium 136; TSH 1.121   Lipid Panel    Component Value Date/Time   CHOL 106  01/25/2021 1228   CHOL 217 (H) 03/31/2020 1208   TRIG 164 (H) 01/25/2021 1228   HDL 38 (L) 01/25/2021 1228   HDL 38 (L) 03/31/2020 1208   CHOLHDL 2.8 01/25/2021 1228   VLDL 33 01/25/2021 1228   LDLCALC 35 01/25/2021 1228   LDLCALC 144 (H) 03/31/2020 1208    Additional studies/ records that were reviewed today include:   Cardiac Catheterization: 04/2020 Ost LM lesion is 25% stenosed. Prox LAD to Mid LAD lesion is 75% stenosed. 1st Diag lesion is 90% stenosed. Ost Cx to Prox Cx lesion is 80% stenosed. 1st Mrg lesion is 95% stenosed. RPDA lesion is 90% stenosed. 1st RPL lesion is 90% stenosed. The left ventricular systolic function is normal. LV end diastolic pressure is normal. The left ventricular ejection fraction is 55-65% by visual estimate.   1. Severe 3 vessel obstructive CAD 2. Normal LV function 3. Normal LVEDP   Plan: would consider revascularization with CABG.   Echocardiogram: 02/2021 IMPRESSIONS     1. Left ventricular ejection fraction, by estimation, is 60 to 65%. The  left ventricle has normal function. The left ventricle has no regional  wall motion abnormalities. There is mild left ventricular hypertrophy.  Left ventricular diastolic parameters  are indeterminate.   2. Right ventricular systolic function is mildly ro moderately reduced.  The right ventricular size is mildly enlarged. Tricuspid regurgitation  signal is inadequate for assessing PA pressure.   3. The mitral valve is grossly normal. Trivial mitral valve  regurgitation.   4. The aortic valve was not  well visualized. Aortic valve regurgitation  is not visualized.   5. The inferior vena cava is normal in size with greater than 50%  respiratory variability, suggesting right atrial pressure of 3 mmHg.   Assessment:    1. Coronary artery disease involving native coronary artery of native heart without angina pectoris   2. Essential hypertension   3. Lower extremity edema   4. Hyperlipidemia  LDL goal <70      Plan:   In order of problems listed above:  1. CAD - She is s/p CABG on 04/27/2020 with LIMA-LAD, seq SVG-PDA-PL, seq left radial-RI-OM. Denies any anginal symptoms when participating in cardiac rehab but she does continue to have tenderness to palpation along her sternal region which has been present since CABG. - Continue ASA 81mg  daily, Plavix 75mg  daily and Crestor 40mg  daily. Will continue Lopressor but reduce dosing to 12.5mg  BID to see if this helps with her dizziness and fatigue. Also reviewed she could stop Plavix in 04/2021 once a year out from her ACS event as previously recommended by Dr. Harrington Challenger.   2. HTN - Her BP is at 118/58 during today's visit but she continues to have dizziness with positional changes and fatigue. Checked orthostatics today and SBP was non-diagnostic of orthostatic hypotension but did drop to 102. Will reduce Lopressor from 25mg  BID to 12.5mg  BID. I did encourage her to follow BP at home.   3. Lower Extremity Edema - Her volume status has improved since her prior visit and she has not had to utilize Lasix in over 2 weeks. We reviewed she can take this if needed in the future for worsening edema or weight gain.   4. HLD - FLP in 01/2021 showed total cholesterol of 106, triglycerides 164, HDL 38 and LDL 35. Continue current regimen with Zetia 10mg  daily and Crestor 40mg  daily.   Medication Adjustments/Labs and Tests Ordered: Current medicines are reviewed at length with the patient today.  Concerns regarding medicines are outlined above.  Medication changes, Labs and Tests ordered today are listed in the Patient Instructions below. Patient Instructions  Medication Instructions:   Decrease Lopressor to 12.5 mg Two Times Daily  Stop Taking Plavix in November  *If you need a refill on your cardiac medications before your next appointment, please call your pharmacy*   Lab Work: NONE   If you have labs (blood work) drawn today and your  tests are completely normal, you will receive your results only by: [place] (if you have MyChart) OR A paper copy in the mail If you have any lab test that is abnormal or we need to change your treatment, we will call you to review the results.   Testing/Procedures: NONE   Follow-Up: At [facility], you and your health needs are our priority.  As part of our continuing mission to provide you with exceptional heart care, we have created designated Provider Care Teams.  These Care Teams include your primary Cardiologist (physician) and Advanced Practice Providers (APPs -  Physician Assistants and Nurse Practitioners) who all work together to provide you with the care you need, when you need it.  We recommend signing up for the patient portal called "MyChart".  Sign up information is provided on this After Visit Summary.  MyChart is used to connect with patients for Virtual Visits (Telemedicine).  Patients are able to view lab/test results, encounter notes, upcoming appointments, etc.  Non-urgent messages can be sent to your provider as well.   To learn  more about what you can do with MyChart, go to [email or web address].    Your next appointment:   3 month(s)  The format for your next appointment:   In Person  Provider:   Dorris Carnes, MD   Other Instructions Thank you for choosing [facility]!     Signed, Erma Heritage, PA-C  04/13/2021 8:26 PM    [facility] [address] [place], [place] [address] Phone: [phone] Fax: [phone]

## 2021-04-13 NOTE — Patient Instructions (Signed)
Medication Instructions:   Decrease Lopressor to 12.5 mg Two Times Daily  Stop Taking Plavix in November  *If you need a refill on your cardiac medications before your next appointment, please call your pharmacy*   Lab Work: NONE   If you have labs (blood work) drawn today and your tests are completely normal, you will receive your results only by: [place] (if you have MyChart) OR A paper copy in the mail If you have any lab test that is abnormal or we need to change your treatment, we will call you to review the results.   Testing/Procedures: NONE   Follow-Up: At [facility], you and your health needs are our priority.  As part of our continuing mission to provide you with exceptional heart care, we have created designated Provider Care Teams.  These Care Teams include your primary Cardiologist (physician) and Advanced Practice Providers (APPs -  Physician Assistants and Nurse Practitioners) who all work together to provide you with the care you need, when you need it.  We recommend signing up for the patient portal called "MyChart".  Sign up information is provided on this After Visit Summary.  MyChart is used to connect with patients for Virtual Visits (Telemedicine).  Patients are able to view lab/test results, encounter notes, upcoming appointments, etc.  Non-urgent messages can be sent to your provider as well.   To learn more about what you can do with MyChart, go to [email or web address].    Your next appointment:   3 month(s)  The format for your next appointment:   In Person  Provider:   Dorris Carnes, MD   Other Instructions Thank you for choosing [facility]!

## 2021-04-14 ENCOUNTER — Encounter (HOSPITAL_COMMUNITY)
Admission: RE | Admit: 2021-04-14 | Discharge: 2021-04-14 | Disposition: A | Discharge status: Home / Self Care | Payer: Medicare Other | Source: Ambulatory Visit | Attending: Internal Medicine | Admitting: Internal Medicine

## 2021-04-14 DIAGNOSIS — Z951 Presence of aortocoronary bypass graft: Secondary | ICD-10-CM

## 2021-04-14 NOTE — Progress Notes (Signed)
Daily Session Note  Patient Details  Name: Katrina Castro MRN: [id number] Date of Birth: February 20, 1946 Referring Provider:   Flowsheet Row CARDIAC REHAB PHASE II ORIENTATION from 01/19/2021 in [place]  Referring Provider Dr. Harrington Challenger       Encounter Date: 04/14/2021  Check In:  Session Check In - 04/14/21 0815       Check-In   Supervising physician immediately available to respond to emergencies [facility] MD immediately available    Physician(s) Dr. Harl Bowie    Location [facility]    Staff Present Hoy Register, MS, ACSM-CEP, Exercise Physiologist;Other    Virtual Visit No    Medication changes reported     Yes    Comments Lopressor reduced to 12.5 mg bid    Fall or balance concerns reported    No    Tobacco Cessation No Change    Warm-up and Cool-down Performed as group-led instruction    Resistance Training Performed Yes    VAD Patient? No    PAD/SET Patient? No      Pain Assessment   Currently in Pain? No/denies    Pain Score 0-No pain    Multiple Pain Sites No             Capillary Blood Glucose: No results found for this or any previous visit (from the past 24 hour(s)).    Social History   Tobacco Use  Smoking Status Never  Smokeless Tobacco Never    Goals Met:  Independence with exercise equipment Exercise tolerated well No report of concerns or symptoms today Strength training completed today  Goals Unmet:  Not Applicable  Comments: checkout time is 0915   Dr. Kathie Dike is Medical Director for [facility].

## 2021-04-17 DIAGNOSIS — E1165 Type 2 diabetes mellitus with hyperglycemia: Secondary | ICD-10-CM | POA: Diagnosis not present

## 2021-04-17 DIAGNOSIS — I10 Essential (primary) hypertension: Secondary | ICD-10-CM | POA: Diagnosis not present

## 2021-04-17 DIAGNOSIS — G5793 Unspecified mononeuropathy of bilateral lower limbs: Secondary | ICD-10-CM | POA: Diagnosis not present

## 2021-04-18 NOTE — Progress Notes (Addendum)
Discharge Progress Report  Patient Details   Name: Katrina Castro MRN: [id number] Date of Birth: 1945-09-10 Referring Provider:   Flowsheet Row CARDIAC REHAB PHASE II ORIENTATION from 01/19/2021 in [facility]  Referring Provider Dr. Harrington Challenger        Number of Visits: 27  Reason for Discharge:  Patient reached a stable level of exercise. Patient independent in their exercise. Patient has met program and personal goals.  Smoking History:  Social History   Tobacco Use  Smoking Status Never  Smokeless Tobacco Never    Diagnosis:  S/P CABG x 5  ADL UCSD:   Initial Exercise Prescription:  Initial Exercise Prescription - 01/19/21 1500       Date of Initial Exercise RX and Referring Provider   Date 01/19/21    Referring Provider Dr. Harrington Challenger    Expected Discharge Date 04/14/21      Treadmill   MPH 1.5    Grade 0    Minutes 17      NuStep   Level 1    SPM 80    Minutes 22      Prescription Details   Frequency (times per week) 3    Duration Progress to 30 minutes of continuous aerobic without signs/symptoms of physical distress      Intensity   THRR 40-80% of Max Heartrate 58-116    Ratings of Perceived Exertion 11-13    Perceived Dyspnea 0-4      Resistance Training   Training Prescription Yes    Weight 3 lbs    Reps 10-15             Discharge Exercise Prescription (Final Exercise Prescription Changes):  Exercise Prescription Changes - 04/10/21 1200       Response to Exercise   Blood Pressure (Admit) 108/64    Blood Pressure (Exercise) 110/58    Blood Pressure (Exit) 102/52    Heart Rate (Admit) 62 bpm    Heart Rate (Exercise) 86 bpm    Heart Rate (Exit) 70 bpm    Rating of Perceived Exertion (Exercise) 11    Duration Continue with 30 min of aerobic exercise without signs/symptoms of physical distress.    Intensity THRR unchanged      Progression   Progression Continue to progress workloads to maintain intensity without  signs/symptoms of physical distress.      Resistance Training   Training Prescription Yes    Weight 3 lbs    Reps 10-15    Time 10 Minutes      Treadmill   MPH 2.5    Grade 0    Minutes 17    METs 2.91      NuStep   Level 1    SPM 85    Minutes 22    METs 1.86             Functional Capacity:  6 Minute Walk     Row Name 01/19/21 1552 04/12/21 0843       6 Minute Walk   Phase Initial Discharge    Distance 1400 feet 1600 feet    Distance Feet Change -- 200 ft    Walk Time 6 minutes 6 minutes    # of Rest Breaks 0 0    MPH 2.65 3    METS 2.59 3.14    RPE 12 11    VO2 Peak 9.05 10.99    Symptoms No No    Resting HR 62 bpm 73 bpm  Resting BP 118/70 120/60    Resting Oxygen Saturation  97 % 98 %    Exercise Oxygen Saturation  during 6 min walk 97 % 98 %    Max Ex. HR 84 bpm 100 bpm    Max Ex. BP 140/72 130/60    2 Minute Post BP 106/64 118/60             Psychological, QOL, Others - Outcomes: PHQ 2/9: Depression screen [facility] 2/9 04/18/2021 01/19/2021 07/11/2020  Decreased Interest 1 0 1  Down, Depressed, Hopeless 1 0 1  PHQ - 2 Score 2 0 2  Altered sleeping 1 3 0  Tired, decreased energy 2 0 0  Change in appetite 2 3 1   Feeling bad or failure about yourself  0 0 0  Trouble concentrating 0 0 0  Moving slowly or fidgety/restless 0 0 0  Suicidal thoughts 0 0 0  PHQ-9 Score 7 6 3   Difficult doing work/chores - Not difficult at all Somewhat difficult  Some recent data might be hidden    Quality of Life:  Quality of Life - 01/19/21 1308       Quality of Life   Select Quality of Life      Quality of Life Scores   Health/Function Pre 21.59 %    Socioeconomic Pre 26 %    Psych/Spiritual Pre 27.43 %    Family Pre 21 %    GLOBAL Pre 24.05 %             Personal Goals: Goals established at orientation with interventions provided to work toward goal.  Personal Goals and Risk Factors at Admission - 01/19/21 1312       Core Components/Risk  Factors/Patient Goals on Admission    Weight Management Yes;Weight Loss    Intervention Weight Management: Develop a combined nutrition and exercise program designed to reach desired caloric intake, while maintaining appropriate intake of nutrient and fiber, sodium and fats, and appropriate energy expenditure required for the weight goal.;Weight Management: Provide education and appropriate resources to help participant work on and attain dietary goals.;Weight Management/Obesity: Establish reasonable short term and long term weight goals.;Obesity: Provide education and appropriate resources to help participant work on and attain dietary goals.    Expected Outcomes Short Term: Continue to assess and modify interventions until short term weight is achieved;Long Term: Adherence to nutrition and physical activity/exercise program aimed toward attainment of established weight goal;Weight Maintenance: Understanding of the daily nutrition guidelines, which includes 25-35% calories from fat, 7% or less cal from saturated fats, less than 235m cholesterol, less than 1.5gm of sodium, & 5 or more servings of fruits and vegetables daily;Weight Loss: Understanding of general recommendations for a balanced deficit meal plan, which promotes 1-2 lb weight loss per week and includes a negative energy balance of [phone] kcal/d;Understanding recommendations for meals to include 15-35% energy as protein, 25-35% energy from fat, 35-60% energy from carbohydrates, less than 201mof dietary cholesterol, 20-35 gm of total fiber daily;Understanding of distribution of calorie intake throughout the day with the consumption of 4-5 meals/snacks    Improve shortness of breath with ADL's Yes    Intervention Provide education, individualized exercise plan and daily activity instruction to help decrease symptoms of SOB with activities of daily living.    Expected Outcomes Short Term: Improve cardiorespiratory fitness to achieve a reduction  of symptoms when performing ADLs;Long Term: Be able to perform more ADLs without symptoms or delay the onset of symptoms  Diabetes Yes    Intervention Provide education about signs/symptoms and action to take for hypo/hyperglycemia.;Provide education about proper nutrition, including hydration, and aerobic/resistive exercise prescription along with prescribed medications to achieve blood glucose in normal ranges: Fasting glucose 65-99 mg/dL    Expected Outcomes Short Term: Participant verbalizes understanding of the signs/symptoms and immediate care of hyper/hypoglycemia, proper foot care and importance of medication, aerobic/resistive exercise and nutrition plan for blood glucose control.;Long Term: Attainment of HbA1C < 7%.    Hypertension Yes    Intervention Provide education on lifestyle modifcations including regular physical activity/exercise, weight management, moderate sodium restriction and increased consumption of fresh fruit, vegetables, and low fat dairy, alcohol moderation, and smoking cessation.;Monitor prescription use compliance.    Expected Outcomes Short Term: Continued assessment and intervention until BP is < 140/40m HG in hypertensive participants. < 130/865mHG in hypertensive participants with diabetes, heart failure or chronic kidney disease.;Long Term: Maintenance of blood pressure at goal levels.              Personal Goals Discharge:  Goals and Risk Factor Review     Row Name 01/23/21 1017 02/22/21 1022 03/20/21 1252 04/18/21 1503       Core Components/Risk Factors/Patient Goals Review   Personal Goals Review Weight Management/Obesity;Diabetes;Other -- Weight Management/Obesity;Diabetes;Other Weight Management/Obesity;Diabetes;Other    Review Patient was referred to CR with CABGx5. She has completed 1 sessions. Her DM is managed with Glipizide with her last A1C on file 05/12/21 at 6.4% which has trended down from 7.5%. Her personal goals for the program are to get  stronger; exercise more; lose 10-15 lbs; and eat better. We will continue to monitor her progress as she works towards meeting these goals. Patient has completed 9 sessions maintaining her weight since her initial visit. Her attendance has not been consistent due to chronic GI distress which she blames on starting Metformin. She say her cardiologist 8/2. She decreased her metoprolol to 25 mg daily due to complaints of fatigue. She has a BMET and MD added lasix 40 mg with KCL every 3rd day for increased weight and she will continue until her weight is stable per MD. No recent A1C on file. Patient's personal goals for the program continue to be to get stronger; exerce more; lose 10 to 15 lbs and eat healthier. We will continue to monitor her progress as he works towards meeting these goals. Patient has completed 18 sessionslosing 2 lbs since her last 30 day review. She is doing well in the program with more consistent attendance. She has not been progressed due to complaints of fatigue often. She had an echocardiagram 9/2 with normal LV function with EF 60-65% and her RV showed mild dysfunction but was unchanged from previous echo per Dr. RoAlan Ripperote.  No recent A1C on file. Patient's personal goals for the program continue to be to get stronger; exerce more; lose 10 to 15 lbs and eat healthier. We will continue to monitor her progress as he works towards meeting these goals. Pt graduated from the program after 27 sessions. She was able to lose 3 kg while she was in the program. She was not able to significantly increase her workloads, as she frequently complained of dizziness and fatigue. Her blood pressure medication was cut in half just before she graduated from the program, so she hopes that this will help with her dizziness and fatigue. She was able to increase her six minute walk distance by 200 ft.    Expected Outcomes Patient  will complete the program meeting both personal and program goals. Patient will  complete the program meeting both personal and program goals. Patient will complete the program meeting both personal and program goals. Pt will continue to work towards their goals post discharge.             Exercise Goals and Review:  Exercise Goals     Row Name 01/19/21 1556 01/30/21 0956 02/28/21 0929 03/27/21 1301       Exercise Goals   Increase Physical Activity Yes Yes Yes Yes    Intervention Provide advice, education, support and counseling about physical activity/exercise needs.;Develop an individualized exercise prescription for aerobic and resistive training based on initial evaluation findings, risk stratification, comorbidities and participant's personal goals. Provide advice, education, support and counseling about physical activity/exercise needs.;Develop an individualized exercise prescription for aerobic and resistive training based on initial evaluation findings, risk stratification, comorbidities and participant's personal goals. Provide advice, education, support and counseling about physical activity/exercise needs.;Develop an individualized exercise prescription for aerobic and resistive training based on initial evaluation findings, risk stratification, comorbidities and participant's personal goals. Provide advice, education, support and counseling about physical activity/exercise needs.;Develop an individualized exercise prescription for aerobic and resistive training based on initial evaluation findings, risk stratification, comorbidities and participant's personal goals.    Expected Outcomes Short Term: Attend rehab on a regular basis to increase amount of physical activity.;Long Term: Add in home exercise to make exercise part of routine and to increase amount of physical activity.;Long Term: Exercising regularly at least 3-5 days a week. Short Term: Attend rehab on a regular basis to increase amount of physical activity.;Long Term: Add in home exercise to make exercise  part of routine and to increase amount of physical activity.;Long Term: Exercising regularly at least 3-5 days a week. Short Term: Attend rehab on a regular basis to increase amount of physical activity.;Long Term: Add in home exercise to make exercise part of routine and to increase amount of physical activity.;Long Term: Exercising regularly at least 3-5 days a week. Short Term: Attend rehab on a regular basis to increase amount of physical activity.;Long Term: Add in home exercise to make exercise part of routine and to increase amount of physical activity.;Long Term: Exercising regularly at least 3-5 days a week.    Increase Strength and Stamina Yes Yes Yes Yes    Intervention Provide advice, education, support and counseling about physical activity/exercise needs.;Develop an individualized exercise prescription for aerobic and resistive training based on initial evaluation findings, risk stratification, comorbidities and participant's personal goals. Provide advice, education, support and counseling about physical activity/exercise needs.;Develop an individualized exercise prescription for aerobic and resistive training based on initial evaluation findings, risk stratification, comorbidities and participant's personal goals. Provide advice, education, support and counseling about physical activity/exercise needs.;Develop an individualized exercise prescription for aerobic and resistive training based on initial evaluation findings, risk stratification, comorbidities and participant's personal goals. Provide advice, education, support and counseling about physical activity/exercise needs.;Develop an individualized exercise prescription for aerobic and resistive training based on initial evaluation findings, risk stratification, comorbidities and participant's personal goals.    Expected Outcomes Short Term: Increase workloads from initial exercise prescription for resistance, speed, and METs.;Short Term:  Perform resistance training exercises routinely during rehab and add in resistance training at home;Long Term: Improve cardiorespiratory fitness, muscular endurance and strength as measured by increased METs and functional capacity (6MWT) Short Term: Increase workloads from initial exercise prescription for resistance, speed, and METs.;Short Term: Perform resistance training exercises routinely  during rehab and add in resistance training at home;Long Term: Improve cardiorespiratory fitness, muscular endurance and strength as measured by increased METs and functional capacity (6MWT) Short Term: Increase workloads from initial exercise prescription for resistance, speed, and METs.;Short Term: Perform resistance training exercises routinely during rehab and add in resistance training at home;Long Term: Improve cardiorespiratory fitness, muscular endurance and strength as measured by increased METs and functional capacity (6MWT) Short Term: Increase workloads from initial exercise prescription for resistance, speed, and METs.;Short Term: Perform resistance training exercises routinely during rehab and add in resistance training at home;Long Term: Improve cardiorespiratory fitness, muscular endurance and strength as measured by increased METs and functional capacity (6MWT)    Able to understand and use rate of perceived exertion (RPE) scale Yes Yes Yes Yes    Intervention Provide education and explanation on how to use RPE scale Provide education and explanation on how to use RPE scale Provide education and explanation on how to use RPE scale Provide education and explanation on how to use RPE scale    Expected Outcomes Short Term: Able to use RPE daily in rehab to express subjective intensity level;Long Term:  Able to use RPE to guide intensity level when exercising independently Short Term: Able to use RPE daily in rehab to express subjective intensity level;Long Term:  Able to use RPE to guide intensity level when  exercising independently Short Term: Able to use RPE daily in rehab to express subjective intensity level;Long Term:  Able to use RPE to guide intensity level when exercising independently Short Term: Able to use RPE daily in rehab to express subjective intensity level;Long Term:  Able to use RPE to guide intensity level when exercising independently    Knowledge and understanding of Target Heart Rate Range (THRR) Yes Yes Yes Yes    Intervention Provide education and explanation of THRR including how the numbers were predicted and where they are located for reference Provide education and explanation of THRR including how the numbers were predicted and where they are located for reference Provide education and explanation of THRR including how the numbers were predicted and where they are located for reference Provide education and explanation of THRR including how the numbers were predicted and where they are located for reference    Expected Outcomes Short Term: Able to state/look up THRR;Long Term: Able to use THRR to govern intensity when exercising independently;Short Term: Able to use daily as guideline for intensity in rehab Short Term: Able to state/look up THRR;Long Term: Able to use THRR to govern intensity when exercising independently;Short Term: Able to use daily as guideline for intensity in rehab Short Term: Able to state/look up THRR;Long Term: Able to use THRR to govern intensity when exercising independently;Short Term: Able to use daily as guideline for intensity in rehab Short Term: Able to state/look up THRR;Long Term: Able to use THRR to govern intensity when exercising independently;Short Term: Able to use daily as guideline for intensity in rehab    Able to check pulse independently Yes Yes Yes Yes    Intervention Provide education and demonstration on how to check pulse in carotid and radial arteries.;Review the importance of being able to check your own pulse for safety during  independent exercise Provide education and demonstration on how to check pulse in carotid and radial arteries.;Review the importance of being able to check your own pulse for safety during independent exercise Provide education and demonstration on how to check pulse in carotid and radial arteries.;Review the importance  of being able to check your own pulse for safety during independent exercise Provide education and demonstration on how to check pulse in carotid and radial arteries.;Review the importance of being able to check your own pulse for safety during independent exercise    Expected Outcomes Short Term: Able to explain why pulse checking is important during independent exercise;Long Term: Able to check pulse independently and accurately Short Term: Able to explain why pulse checking is important during independent exercise;Long Term: Able to check pulse independently and accurately Short Term: Able to explain why pulse checking is important during independent exercise;Long Term: Able to check pulse independently and accurately Short Term: Able to explain why pulse checking is important during independent exercise;Long Term: Able to check pulse independently and accurately    Understanding of Exercise Prescription Yes Yes Yes Yes    Intervention Provide education, explanation, and written materials on patient's individual exercise prescription Provide education, explanation, and written materials on patient's individual exercise prescription Provide education, explanation, and written materials on patient's individual exercise prescription Provide education, explanation, and written materials on patient's individual exercise prescription    Expected Outcomes Short Term: Able to explain program exercise prescription;Long Term: Able to explain home exercise prescription to exercise independently Short Term: Able to explain program exercise prescription;Long Term: Able to explain home exercise prescription  to exercise independently Short Term: Able to explain program exercise prescription;Long Term: Able to explain home exercise prescription to exercise independently Short Term: Able to explain program exercise prescription;Long Term: Able to explain home exercise prescription to exercise independently             Exercise Goals Re-Evaluation:  Exercise Goals Re-Evaluation     Row Name 01/30/21 0956 02/28/21 0929 03/27/21 1302         Exercise Goal Re-Evaluation   Exercise Goals Review Increase Physical Activity;Increase Strength and Stamina;Able to understand and use rate of perceived exertion (RPE) scale;Knowledge and understanding of Target Heart Rate Range (THRR);Able to check pulse independently;Understanding of Exercise Prescription Increase Physical Activity;Increase Strength and Stamina;Able to understand and use rate of perceived exertion (RPE) scale;Knowledge and understanding of Target Heart Rate Range (THRR);Able to check pulse independently;Understanding of Exercise Prescription Increase Physical Activity;Increase Strength and Stamina;Able to understand and use rate of perceived exertion (RPE) scale;Knowledge and understanding of Target Heart Rate Range (THRR);Able to check pulse independently;Understanding of Exercise Prescription     Comments Pt has completed 3 sessions of cardiac rehab. Her CBG has been running high as of late, and she was unable to exercise today because it was 310 at her arrival. She sees her PCP tomorrow and will talk about these concerns with him. She also reports that she feels like she has no energy, so Dr. Harrington Challenger has cut back on her metoprolol. She currently exercises at 1.60 METs while on the stepper. Will continue to monitor and progress as able. Pt has completed 10 sessions of cardiac rehab. Her attendance has been inconsistent as of late. I do not feel as if she pushes herself as much as she could while on the stepper and is easily distracted. She is currently  exercising at 1.65 METs on the stepper. Will continue to monitor and progress as able. Pt has completed 21 sessions of cardiac rehab. She seems to be easly distracted during the class and does not seem to be pushing herself while exercising. She complains that the stepper is hard on her legs. She has recently complained the past weeks about fluid on her  knee effecting exercise, we have encourged her to speak to her orthopedic. She is currently exerising at 2.91 METs on the TM. Will continue to montior and progress as able.     Expected Outcomes Through exercise at rehab and at home, the patient will meet their stated goals. Through exercise at rehab and at home, the patient will meet their stated goals. Through exercise at rehab and at home, the patient will meet their stated goals.              Nutrition & Weight - Outcomes:  Pre Biometrics - 01/19/21 1557       Pre Biometrics   Height 5' 2.5" (1.588 m)    Weight 156 lb 1.4 oz (70.8 kg)    Waist Circumference 43.5 inches    Hip Circumference 40.5 inches    Waist to Hip Ratio 1.07 %    BMI (Calculated) 28.08    Triceps Skinfold 28 mm    % Body Fat 43.1 %    Grip Strength 21.1 kg    Flexibility 0 in    Single Leg Stand 2.56 seconds             Post Biometrics - 04/12/21 0844        Post  Biometrics   Height 5' 2.5" (1.588 m)    Weight 149 lb 7.6 oz (67.8 kg)    Waist Circumference 42 inches    Hip Circumference 37 inches    Waist to Hip Ratio 1.14 %    BMI (Calculated) 26.89    Triceps Skinfold 25 mm    % Body Fat 41.3 %    Grip Strength 22 kg    Flexibility 0 in    Single Leg Stand 2.54 seconds             Nutrition:  Nutrition Therapy & Goals - 01/23/21 1011       Personal Nutrition Goals   Comments Patient scored 10 on her diet assessment. We offer 2 educational sessons on heart healthy nutrition with handouts and assistance with RD referral if interested.      Intervention Plan   Intervention Nutrition  handout(s) given to patient.             Nutrition Discharge:  Nutrition Assessments - 04/18/21 1455       MEDFICTS Scores   Post Score --   Did not fill out properly. Cannot grade            Education Questionnaire Score:  Knowledge Questionnaire Score - 04/18/21 1454       Knowledge Questionnaire Score   Post Score 14/24             Goals reviewed with patient; copy given to patient. Pt graduated from cardiac rehab after 27 sessions. She had consistent attendance, but was unable to progress her workloads significantly while in the program due to complaints of fatigue and dizziness. She was able to increase her six minute walk distance by 14.3% from orientation to discharge. She reports that she will continue to exercise at [employer].

## 2021-04-26 ENCOUNTER — Ambulatory Visit: Payer: Medicare Other | Admitting: Student

## 2021-05-15 DIAGNOSIS — Z299 Encounter for prophylactic measures, unspecified: Secondary | ICD-10-CM | POA: Diagnosis not present

## 2021-05-15 DIAGNOSIS — I10 Essential (primary) hypertension: Secondary | ICD-10-CM | POA: Diagnosis not present

## 2021-05-15 DIAGNOSIS — E1165 Type 2 diabetes mellitus with hyperglycemia: Secondary | ICD-10-CM | POA: Diagnosis not present

## 2021-05-17 DIAGNOSIS — E7800 Pure hypercholesterolemia, unspecified: Secondary | ICD-10-CM | POA: Diagnosis not present

## 2021-05-17 DIAGNOSIS — I10 Essential (primary) hypertension: Secondary | ICD-10-CM | POA: Diagnosis not present

## 2021-05-19 DIAGNOSIS — E538 Deficiency of other specified B group vitamins: Secondary | ICD-10-CM | POA: Diagnosis not present

## 2021-05-26 DIAGNOSIS — M25561 Pain in right knee: Secondary | ICD-10-CM | POA: Diagnosis not present

## 2021-05-31 ENCOUNTER — Telehealth (INDEPENDENT_AMBULATORY_CARE_PROVIDER_SITE_OTHER): Payer: Self-pay

## 2021-05-31 NOTE — Telephone Encounter (Signed)
Spoke with the patient and her daughter regarding the fact that she has achalasia and as discussed 5 months ago, she would benefit more from undergoing over time of intervention different from EGD with Botox injection.  Both of them expressed her interest in being referred for a POEM for achalasia.  I advised the patient to avoid the type of food that she feels is not going down easily, such as bread or steak/meat.   Ann, can you please refer her to [place] or to [place] for evaluation of achalasia and possible POEM?  Please send referral to the 1 that has the shorter time to be seen at their clinic  Thanks

## 2021-05-31 NOTE — Telephone Encounter (Signed)
Patient called today stating she is still having issues with Swallowing especially breads. She states when she lays down she gets strangled and starts to cough. She states she feels a knot in her epigastric area. She has a hx of a hiatal hernia    On 01/02/2021 telephone visit (See below)  I have her scheduled on 01/17/21   January 02, 2021 Harvel Quale, MD to Me  [place], Michelene Gardener, CMA      4:32 PM Note I spoke with the patient today regarding her decision to proceed with Botox and balloon dilation.  I explained to her that we do not perform hydrostatic balloon dilation but will only perform dilation with CRE balloon up to 20 mm and injection of Botox in 4 quadrants.  She would like to proceed with this for now as she is fearful that she may have a bad outcome if she has any type of procedure (including POEM) prior to a year from her CABG.  She has not discussed this with her daughter who asked her to proceed with POEM but she would like to proceed with Botox injection.  She is not interested in a surgical procedure.   Hi Darius Bump,    Can you please schedule a EGD with esophageal dilation and Botox injection? Dx: Achalasia. Room: 3.  Please asked the patient to avoid any solid food 3 days prior to the procedure.   Thanks,   Maylon Peppers, MD Gastroenterology and Hepatology [facility]            2:16 PM You routed this conversation to Montez Morita, Quillian Quince, MD  Me      2:16 PM Note Patient called today stating she has made her decision she would like to get "stretched" only for now. Please advise.             At ov here on 12/29/2020 office note per Dr. Jenetta Downer -Discuss with daughter regarding referral to [facility] for possible POEM procedure (best procedure) vs Heller myotomy injection with Botoxia and achalasia. Was told

## 2021-05-31 NOTE — Telephone Encounter (Signed)
noted 

## 2021-06-01 NOTE — Telephone Encounter (Signed)
Referral faxed to [facility], they will contact patient with apt

## 2021-06-13 ENCOUNTER — Telehealth (INDEPENDENT_AMBULATORY_CARE_PROVIDER_SITE_OTHER): Payer: Self-pay

## 2021-06-13 NOTE — Telephone Encounter (Signed)
Patient called today and states she is still having issues with swallowing especially breads. She has been trying to avoid these.She says [facility] called her and told her it would April 2023 before they can see her for the procedure.She says they told her they could place her on a cancellation, but wanted to know what else could be done or can you call them and ask them to place her higher in priority. Please advise.       Last Note from Dr. Jenetta Downer: Spoke with the patient and her daughter regarding the fact that she has achalasia and as discussed 5 months ago, she would benefit more from undergoing over time of intervention different from EGD with Botox injection.  Both of them expressed her interest in being referred for a POEM for achalasia.  I advised the patient to avoid the type of food that she feels is not going down easily, such as bread or steak/meat.     Ann, can you please refer her to [place] or to [place] for evaluation of achalasia and possible POEM?  Please send referral to the 1 that has the shorter time to be seen at their clinic   Thanks

## 2021-06-13 NOTE — Telephone Encounter (Signed)
Unfortunately the procedure she has is not a priority so she will need to wait until April. If willing, we can try another EGD with botox injection to buy some time. Please le me know what she says and ask her to avoid eating bread for now.

## 2021-06-13 NOTE — Telephone Encounter (Signed)
Patient aware of all, and states she will need to think about the EGD with Botox and she will call us back to let us know her decision.

## 2021-06-14 ENCOUNTER — Other Ambulatory Visit (INDEPENDENT_AMBULATORY_CARE_PROVIDER_SITE_OTHER): Payer: Self-pay

## 2021-06-14 DIAGNOSIS — K220 Achalasia of cardia: Secondary | ICD-10-CM

## 2021-06-14 NOTE — Telephone Encounter (Signed)
Thanks, Hi Darius Bump,   Can you please schedule an EGD with botox injection? Dx: achalasia. Room: 3  Thanks,  Maylon Peppers, MD Gastroenterology and Hepatology [facility]

## 2021-06-14 NOTE — Telephone Encounter (Signed)
Thanks Leigh Ann °

## 2021-06-14 NOTE — Telephone Encounter (Signed)
Patient called back this morning stating she does want to proceed with the EGD with Botox.   Please advise.

## 2021-06-14 NOTE — Telephone Encounter (Signed)
Hey Katrina Castro,   FYI: This patient has called back regarding getting this scheduled, and is anxious about hearing when this will be set up.

## 2021-06-16 DIAGNOSIS — E7800 Pure hypercholesterolemia, unspecified: Secondary | ICD-10-CM | POA: Diagnosis not present

## 2021-06-19 DIAGNOSIS — M25561 Pain in right knee: Secondary | ICD-10-CM | POA: Diagnosis not present

## 2021-06-20 DIAGNOSIS — E538 Deficiency of other specified B group vitamins: Secondary | ICD-10-CM | POA: Diagnosis not present

## 2021-06-21 ENCOUNTER — Other Ambulatory Visit (INDEPENDENT_AMBULATORY_CARE_PROVIDER_SITE_OTHER): Payer: Self-pay

## 2021-06-21 ENCOUNTER — Encounter (INDEPENDENT_AMBULATORY_CARE_PROVIDER_SITE_OTHER): Payer: Self-pay

## 2021-06-23 DIAGNOSIS — M25561 Pain in right knee: Secondary | ICD-10-CM | POA: Diagnosis not present

## 2021-06-26 DIAGNOSIS — M25561 Pain in right knee: Secondary | ICD-10-CM | POA: Diagnosis not present

## 2021-06-27 ENCOUNTER — Ambulatory Visit (HOSPITAL_COMMUNITY): Payer: Medicare Other | Attending: Orthopedic Surgery | Admitting: Physical Therapy

## 2021-06-30 ENCOUNTER — Telehealth: Payer: Self-pay | Admitting: Student

## 2021-06-30 DIAGNOSIS — M25561 Pain in right knee: Secondary | ICD-10-CM | POA: Diagnosis not present

## 2021-06-30 NOTE — Telephone Encounter (Signed)
Left a message for patient to call back. 

## 2021-06-30 NOTE — Telephone Encounter (Signed)
Patient was returning call. Please advise ?

## 2021-06-30 NOTE — Telephone Encounter (Signed)
° °  Pt would like to f/u her referral to a diabetic doctor, she said she has not heard anything since october about it. She also said she is getting SOB after a short walk and would like to ask Dr. Harrington Challenger what she needs to do

## 2021-07-03 DIAGNOSIS — M25561 Pain in right knee: Secondary | ICD-10-CM | POA: Diagnosis not present

## 2021-07-03 NOTE — Telephone Encounter (Signed)
Referral sent 03/2021 to [facility] Endocrinology per Dr. Dorris Carnes. Pt stated she has not been contacted regarding an appt. Pt wanted number to Endocrinologist office- provider. Pt had no further questions or concerns at this time.

## 2021-07-05 DIAGNOSIS — M25561 Pain in right knee: Secondary | ICD-10-CM | POA: Diagnosis not present

## 2021-07-05 NOTE — Patient Instructions (Signed)
Katrina Castro  07/05/2021     @PREFPERIOPPHARMACY @   Your procedure is scheduled on  07/11/2021.   Report to [facility] at 0930 A.M.   Call this number if you have problems the morning of surgery:  [phone]   Remember:  Follow the diet instructions given to you by the office.    DO NOT take any medications for diabetes the morning of your procedure.    Take these medicines the morning of surgery with A SIP OF WATER        xanax(if needed),xyzal, metoprolol, protonix, ditropan.     Do not wear jewelry, make-up or nail polish.  Do not wear lotions, powders, or perfumes, or deodorant.  Do not shave 48 hours prior to surgery.  Men may shave face and neck.  Do not bring valuables to the hospital.  [facility] is not responsible for any belongings or valuables.  Contacts, dentures or bridgework may not be worn into surgery.  Leave your suitcase in the car.  After surgery it may be brought to your room.  For patients admitted to the hospital, discharge time will be determined by your treatment team.  Patients discharged the day of surgery will not be allowed to drive home and must have someone with them for 24 hours.    Special instructions:   DO NOT smoke tobacco or vape for 24 hours before your procedure.  Please read over the following fact sheets that you were given. Anesthesia Post-op Instructions and Care and Recovery After Surgery      Upper Endoscopy, Adult, Care After This sheet gives you information about how to care for yourself after your procedure. Your health care provider may also give you more specific instructions. If you have problems or questions, contact your health care provider. What can I expect after the procedure? After the procedure, it is common to have: A sore throat. Mild stomach pain or discomfort. Bloating. Nausea. Follow these instructions at home:  Follow instructions from your health care provider about what to eat or  drink after your procedure. Return to your normal activities as told by your health care provider. Ask your health care provider what activities are safe for you. Take over-the-counter and prescription medicines only as told by your health care provider. If you were given a sedative during the procedure, it can affect you for several hours. Do not drive or operate machinery until your health care provider says that it is safe. Keep all follow-up visits as told by your health care provider. This is important. Contact a health care provider if you have: A sore throat that lasts longer than one day. Trouble swallowing. Get help right away if: You vomit blood or your vomit looks like coffee grounds. You have: A fever. Bloody, black, or tarry stools. A severe sore throat or you cannot swallow. Difficulty breathing. Severe pain in your chest or abdomen. Summary After the procedure, it is common to have a sore throat, mild stomach discomfort, bloating, and nausea. If you were given a sedative during the procedure, it can affect you for several hours. Do not drive or operate machinery until your health care provider says that it is safe. Follow instructions from your health care provider about what to eat or drink after your procedure. Return to your normal activities as told by your health care provider. This information is not intended to replace advice given to you by your health care provider. Make  sure you discuss any questions you have with your health care provider. Document Revised: 04/10/2019 Document Reviewed: 11/04/2017 Elsevier Patient Education  2022 [place] After This sheet gives you information about how to care for yourself after your procedure. Your health care provider may also give you more specific instructions. If you have problems or questions, contact your health care provider. What can I expect after the procedure? After the procedure,  it is common to have: Tiredness. Forgetfulness about what happened after the procedure. Impaired judgment for important decisions. Nausea or vomiting. Some difficulty with balance. Follow these instructions at home: For the time period you were told by your health care provider:   Rest as needed. Do not participate in activities where you could fall or become injured. Do not drive or use machinery. Do not drink alcohol. Do not take sleeping pills or medicines that cause drowsiness. Do not make important decisions or sign legal documents. Do not take care of children on your own. Eating and drinking Follow the diet that is recommended by your health care provider. Drink enough fluid to keep your urine pale yellow. If you vomit: Drink water, juice, or soup when you can drink without vomiting. Make sure you have little or no nausea before eating solid foods. General instructions Have a responsible adult stay with you for the time you are told. It is important to have someone help care for you until you are awake and alert. Take over-the-counter and prescription medicines only as told by your health care provider. If you have sleep apnea, surgery and certain medicines can increase your risk for breathing problems. Follow instructions from your health care provider about wearing your sleep device: Anytime you are sleeping, including during daytime naps. While taking prescription pain medicines, sleeping medicines, or medicines that make you drowsy. Avoid smoking. Keep all follow-up visits as told by your health care provider. This is important. Contact a health care provider if: You keep feeling nauseous or you keep vomiting. You feel light-headed. You are still sleepy or having trouble with balance after 24 hours. You develop a rash. You have a fever. You have redness or swelling around the IV site. Get help right away if: You have trouble breathing. You have new-onset confusion at  home. Summary For several hours after your procedure, you may feel tired. You may also be forgetful and have poor judgment. Have a responsible adult stay with you for the time you are told. It is important to have someone help care for you until you are awake and alert. Rest as told. Do not drive or operate machinery. Do not drink alcohol or take sleeping pills. Get help right away if you have trouble breathing, or if you suddenly become confused. This information is not intended to replace advice given to you by your health care provider. Make sure you discuss any questions you have with your health care provider. Document Revised: 02/18/2020 Document Reviewed: 05/07/2019 Elsevier Patient Education  2022 [employer].

## 2021-07-05 NOTE — Pre-Procedure Instructions (Signed)
°  RE: plavix Received: Today Margaree Mackintosh, CMA  Encarnacion Chu, RN Yes she knows to hold it 5 days prior        Previous Messages   ----- Message -----  From: Encarnacion Chu, RN  Sent: 07/05/2021   1:40 PM EST  To: Margaree Mackintosh, CMA  Subject: plavix                                         Kathee Polite. I didn't see anything in [facility] notes or letter about her stopping her plavix. Does she know to hold this and if so, how long was she told to hold it?

## 2021-07-06 ENCOUNTER — Other Ambulatory Visit (INDEPENDENT_AMBULATORY_CARE_PROVIDER_SITE_OTHER): Payer: Self-pay

## 2021-07-06 ENCOUNTER — Encounter (HOSPITAL_COMMUNITY)
Admission: RE | Admit: 2021-07-06 | Discharge: 2021-07-06 | Disposition: A | Discharge status: Home / Self Care | Payer: Medicare Other | Source: Ambulatory Visit | Attending: Gastroenterology | Admitting: Gastroenterology

## 2021-07-06 ENCOUNTER — Other Ambulatory Visit: Payer: Self-pay

## 2021-07-06 VITALS — BP 130/71 | HR 65 | Temp 96.8°F | Ht 63.0 in | Wt 150.0 lb

## 2021-07-06 DIAGNOSIS — Z01812 Encounter for preprocedural laboratory examination: Secondary | ICD-10-CM | POA: Insufficient documentation

## 2021-07-06 DIAGNOSIS — E119 Type 2 diabetes mellitus without complications: Secondary | ICD-10-CM

## 2021-07-06 LAB — BASIC METABOLIC PANEL
Anion gap: 8 (ref 5–15)
BUN: 12 mg/dL (ref 8–23)
CO2: 28 mmol/L (ref 22–32)
Calcium: 9.8 mg/dL (ref 8.9–10.3)
Chloride: 105 mmol/L (ref 98–111)
Creatinine, Ser: 0.78 mg/dL (ref 0.44–1.00)
GFR, Estimated: >60 mL/min (ref >60)
Glucose, Bld: 205 mg/dL — ABNORMAL HIGH (ref 70–99)
Potassium: 4.2 mmol/L (ref 3.5–5.1)
Sodium: 141 mmol/L (ref 135–145)

## 2021-07-07 DIAGNOSIS — I4891 Unspecified atrial fibrillation: Secondary | ICD-10-CM | POA: Diagnosis not present

## 2021-07-07 DIAGNOSIS — I739 Peripheral vascular disease, unspecified: Secondary | ICD-10-CM | POA: Diagnosis not present

## 2021-07-07 DIAGNOSIS — R519 Headache, unspecified: Secondary | ICD-10-CM | POA: Diagnosis not present

## 2021-07-07 DIAGNOSIS — E1165 Type 2 diabetes mellitus with hyperglycemia: Secondary | ICD-10-CM | POA: Diagnosis not present

## 2021-07-07 DIAGNOSIS — Z299 Encounter for prophylactic measures, unspecified: Secondary | ICD-10-CM | POA: Diagnosis not present

## 2021-07-11 ENCOUNTER — Encounter (HOSPITAL_COMMUNITY)
Admission: RE | Disposition: A | Discharge status: Home / Self Care | Payer: Self-pay | Source: Home / Self Care | Attending: Gastroenterology

## 2021-07-11 ENCOUNTER — Ambulatory Visit (HOSPITAL_COMMUNITY): Payer: Medicare Other | Admitting: Anesthesiology

## 2021-07-11 ENCOUNTER — Ambulatory Visit (HOSPITAL_COMMUNITY)
Admission: RE | Admit: 2021-07-11 | Discharge: 2021-07-11 | Disposition: A | Discharge status: Home / Self Care | Payer: Medicare Other | Attending: Gastroenterology | Admitting: Gastroenterology

## 2021-07-11 ENCOUNTER — Encounter (HOSPITAL_COMMUNITY): Payer: Self-pay | Admitting: Gastroenterology

## 2021-07-11 DIAGNOSIS — I25110 Atherosclerotic heart disease of native coronary artery with unstable angina pectoris: Secondary | ICD-10-CM | POA: Diagnosis not present

## 2021-07-11 DIAGNOSIS — F32A Depression, unspecified: Secondary | ICD-10-CM | POA: Diagnosis not present

## 2021-07-11 DIAGNOSIS — I2510 Atherosclerotic heart disease of native coronary artery without angina pectoris: Secondary | ICD-10-CM | POA: Insufficient documentation

## 2021-07-11 DIAGNOSIS — K220 Achalasia of cardia: Secondary | ICD-10-CM | POA: Diagnosis not present

## 2021-07-11 DIAGNOSIS — Z951 Presence of aortocoronary bypass graft: Secondary | ICD-10-CM | POA: Insufficient documentation

## 2021-07-11 DIAGNOSIS — Z7902 Long term (current) use of antithrombotics/antiplatelets: Secondary | ICD-10-CM | POA: Insufficient documentation

## 2021-07-11 DIAGNOSIS — I4891 Unspecified atrial fibrillation: Secondary | ICD-10-CM | POA: Insufficient documentation

## 2021-07-11 DIAGNOSIS — K219 Gastro-esophageal reflux disease without esophagitis: Secondary | ICD-10-CM | POA: Insufficient documentation

## 2021-07-11 DIAGNOSIS — E119 Type 2 diabetes mellitus without complications: Secondary | ICD-10-CM | POA: Insufficient documentation

## 2021-07-11 DIAGNOSIS — I10 Essential (primary) hypertension: Secondary | ICD-10-CM | POA: Insufficient documentation

## 2021-07-11 DIAGNOSIS — Z79899 Other long term (current) drug therapy: Secondary | ICD-10-CM | POA: Diagnosis not present

## 2021-07-11 DIAGNOSIS — Z7984 Long term (current) use of oral hypoglycemic drugs: Secondary | ICD-10-CM | POA: Insufficient documentation

## 2021-07-11 HISTORY — PX: ESOPHAGOGASTRODUODENOSCOPY (EGD) WITH PROPOFOL: SHX5813

## 2021-07-11 HISTORY — PX: BOTOX INJECTION: SHX5754

## 2021-07-11 LAB — GLUCOSE, CAPILLARY: Glucose-Capillary: 146 mg/dL — ABNORMAL HIGH (ref 70–99)

## 2021-07-11 SURGERY — ESOPHAGOGASTRODUODENOSCOPY (EGD) WITH PROPOFOL
Anesthesia: General

## 2021-07-11 MED ORDER — LACTATED RINGERS IV SOLN: INTRAVENOUS | Status: DC | PRN

## 2021-07-11 MED ORDER — LIDOCAINE HCL (CARDIAC) PF 100 MG/5ML IV SOSY
PREFILLED_SYRINGE | INTRAVENOUS | Status: DC | PRN
  Administered 2021-07-11: 50 mg via INTRAVENOUS

## 2021-07-11 MED ORDER — PROPOFOL 10 MG/ML IV BOLUS
INTRAVENOUS | Status: DC | PRN
  Administered 2021-07-11: 100 mg via INTRAVENOUS

## 2021-07-11 MED ORDER — PHENYLEPHRINE 40 MCG/ML (10ML) SYRINGE FOR IV PUSH (FOR BLOOD PRESSURE SUPPORT)
PREFILLED_SYRINGE | INTRAVENOUS | Status: DC | PRN
  Administered 2021-07-11: 80 ug via INTRAVENOUS

## 2021-07-11 MED ORDER — ONABOTULINUMTOXINA 100 UNITS IJ SOLR
100.0000 [IU] | Freq: Once | INTRAMUSCULAR | Status: DC
  Filled 2021-07-11 (×2): qty 100

## 2021-07-11 MED ORDER — PROPOFOL 500 MG/50ML IV EMUL
INTRAVENOUS | Status: DC | PRN
  Administered 2021-07-11: 150 ug/kg/min via INTRAVENOUS

## 2021-07-11 MED ORDER — PHENYLEPHRINE 40 MCG/ML (10ML) SYRINGE FOR IV PUSH (FOR BLOOD PRESSURE SUPPORT)
PREFILLED_SYRINGE | INTRAVENOUS | Status: AC
  Filled 2021-07-11: qty 10

## 2021-07-11 MED ORDER — SODIUM CHLORIDE FLUSH 0.9 % IV SOLN
INTRAVENOUS | Status: AC
  Filled 2021-07-11: qty 10

## 2021-07-11 MED ORDER — LACTATED RINGERS IV SOLN: INTRAVENOUS | Status: DC

## 2021-07-11 MED ORDER — SODIUM CHLORIDE (PF) 0.9 % IJ SOLN
INTRAMUSCULAR | Status: DC | PRN
  Administered 2021-07-11: 12:00:00 4 mL via SUBMUCOSAL

## 2021-07-11 NOTE — Discharge Instructions (Addendum)
You are being discharged to home.  Advance your diet as tolerated.  We are waiting for your pathology results.  Follow up with [facility] regarding POEM vs Heller myotomy. Restart Plavix today

## 2021-07-11 NOTE — Anesthesia Preprocedure Evaluation (Signed)
Anesthesia Evaluation  Patient identified by MRN, date of birth, ID band Patient awake    Reviewed: Allergy & Precautions, NPO status , Patient's Chart, lab work & pertinent test results, reviewed documented beta blocker date and time   History of Anesthesia Complications Negative for: history of anesthetic complications  Airway Mallampati: II  TM Distance: >3 FB Neck ROM: Full    Dental  (+) Dental Advisory Given, Partial Upper   Pulmonary neg pulmonary ROS,    Pulmonary exam normal breath sounds clear to auscultation       Cardiovascular hypertension, Pt. on medications and Pt. on home beta blockers + angina + CAD and + CABG (04/2020)  Normal cardiovascular exam+ dysrhythmias  Rhythm:Regular Rate:Normal     Neuro/Psych  Headaches, PSYCHIATRIC DISORDERS Depression    GI/Hepatic Neg liver ROS, GERD (gastrparesis)  Medicated,  Endo/Other  diabetes, Well Controlled, Type 2, Oral Hypoglycemic Agents  Renal/GU negative Renal ROS     Musculoskeletal  (+) Arthritis ,   Abdominal   Peds  Hematology   Anesthesia Other Findings   Reproductive/Obstetrics                             Anesthesia Physical  Anesthesia Plan  ASA: 3  Anesthesia Plan: General   Post-op Pain Management:    Induction: Intravenous  PONV Risk Score and Plan: 3 and Propofol infusion  Airway Management Planned: Nasal Cannula  Additional Equipment:   Intra-op Plan:   Post-operative Plan:   Informed Consent: I have reviewed the patients History and Physical, chart, labs and discussed the procedure including the risks, benefits and alternatives for the proposed anesthesia with the patient or authorized representative who has indicated his/her understanding and acceptance.     Dental advisory given  Plan Discussed with: CRNA and Surgeon  Anesthesia Plan Comments:         Anesthesia Quick Evaluation

## 2021-07-11 NOTE — Anesthesia Procedure Notes (Signed)
Date/Time: 07/11/2021 11:42 AM Performed by: Orlie Dakin, CRNA Pre-anesthesia Checklist: Patient identified, Emergency Drugs available, Suction available and Patient being monitored Patient Re-evaluated:Patient Re-evaluated prior to induction Oxygen Delivery Method: Nasal cannula Induction Type: IV induction Placement Confirmation: positive ETCO2

## 2021-07-11 NOTE — H&P (Signed)
Katrina Castro is an 76 y.o. female.   Chief Complaint: achalasia HPI: Katrina Castro is a 76 y.o. female past medical history of c. Difficile, coronary artery disease status post CABG, hypertension, GERD, type III achalasia, type 2 diabetes and depression, who presents for follow up of type III achalasia .  Patient has presented persistent dysphagia to solids.  Has changed her diet for this.  Past Medical History:  Diagnosis Date   Arthritis    CAD (coronary artery disease)    a. s/p CABG on 04/27/2020 with LIMA-LAD, seq SVG-PDA-PL, seq left radial-RI-OM   Concussion    2015   Depression    Diabetes ([place])    type 2   Dysphagia    Dysrhythmia    afib   GERD (gastroesophageal reflux disease)    Headache    History of bronchitis    Hypertension    Seasonal allergies    Toenail fungus     Past Surgical History:  Procedure Laterality Date   APPENDECTOMY     BACK SURGERY     x2   BOTOX INJECTION N/A 06/06/2016   Procedure: BOTOX INJECTION;  Surgeon: Rogene Houston, MD;  [facility];  Service: Endoscopy;  Laterality: N/A;   BOTOX INJECTION N/A 01/17/2021   Procedure: BOTOX INJECTION;  Surgeon: Harvel Quale, MD;  [facility];  Service: Gastroenterology;  Laterality: N/A;   CHOLECYSTECTOMY     COLONOSCOPY N/A 11/05/2012   Procedure: COLONOSCOPY;  Surgeon: Rogene Houston, MD;  [facility];  Service: Endoscopy;  Laterality: N/A;  1030   COLONOSCOPY WITH PROPOFOL N/A 05/01/2019   Procedure: COLONOSCOPY WITH PROPOFOL;  Surgeon: Rogene Houston, MD;  [facility];  Service: Endoscopy;  Laterality: N/A;  11:20am   CORONARY ARTERY BYPASS GRAFT N/A 04/27/2020   Procedure: CORONARY ARTERY BYPASS GRAFTING (CABG) TIMES FIVE USING LEFT INTERNAL MAMMARY ARTERY, LEFT HARVESTED RADIAL ARTERY, RIGHT GREATER SAPHENOUS VEIN HARVESTED ENDOSCOPICALLY.;  Surgeon: [name], MD;  Location: [facility];  Service: Open Heart Surgery;   Laterality: N/A;   ESOPHAGEAL DILATION N/A 07/06/2015   Procedure: ESOPHAGEAL DILATION;  Surgeon: Rogene Houston, MD;  [facility];  Service: Endoscopy;  Laterality: N/A;   ESOPHAGEAL DILATION N/A 06/06/2016   Procedure: ESOPHAGEAL DILATION;  Surgeon: Rogene Houston, MD;  [facility];  Service: Endoscopy;  Laterality: N/A;   ESOPHAGOGASTRODUODENOSCOPY N/A 02/12/2014   Procedure: ESOPHAGOGASTRODUODENOSCOPY (EGD);  Surgeon: Rogene Houston, MD;  [facility];  Service: Endoscopy;  Laterality: N/A;  150   ESOPHAGOGASTRODUODENOSCOPY N/A 07/06/2015   Procedure: ESOPHAGOGASTRODUODENOSCOPY (EGD);  Surgeon: Rogene Houston, MD;  [facility];  Service: Endoscopy;  Laterality: N/A;  1:25 - moved to 1/18 @ 10:30 - Ann to notify pt   ESOPHAGOGASTRODUODENOSCOPY (EGD) WITH ESOPHAGEAL DILATION N/A 07/25/2012   Procedure: ESOPHAGOGASTRODUODENOSCOPY (EGD) WITH ESOPHAGEAL DILATION;  Surgeon: Rogene Houston, MD;  [facility];  Service: Endoscopy;  Laterality: N/A;  325-rescheduled to [place] notified pt   ESOPHAGOGASTRODUODENOSCOPY (EGD) WITH PROPOFOL N/A 06/06/2016   Procedure: ESOPHAGOGASTRODUODENOSCOPY (EGD) WITH PROPOFOL;  Surgeon: Rogene Houston, MD;  [facility];  Service: Endoscopy;  Laterality: N/A;   ESOPHAGOGASTRODUODENOSCOPY (EGD) WITH PROPOFOL N/A 01/17/2021   Procedure: ESOPHAGOGASTRODUODENOSCOPY (EGD) WITH PROPOFOL;  Surgeon: Harvel Quale, MD;  [facility];  Service: Gastroenterology;  Laterality: N/A;  10:35   EYE SURGERY  cataracts removed   Foot surgeries Bilateral    hammer toes   LEFT HEART CATH AND CORONARY ANGIOGRAPHY N/A 04/26/2020   Procedure: LEFT HEART CATH AND CORONARY ANGIOGRAPHY;  Surgeon: Martinique, Peter M, MD;  Location: [facility];  Service: Cardiovascular;  Laterality: N/A;   MALONEY DILATION N/A 02/12/2014   Procedure: Venia Minks DILATION;  Surgeon: Rogene Houston, MD;  [facility];  Service: Endoscopy;  Laterality: N/A;   POLYPECTOMY  05/01/2019   Procedure: POLYPECTOMY;  Surgeon: Rogene Houston, MD;  [facility];  Service: Endoscopy;;  colon    RADIAL ARTERY HARVEST Left 04/27/2020   Procedure: LEFT RADIAL ARTERY HARVEST;  Surgeon: [name], MD;  Location: [facility];  Service: Open Heart Surgery;  Laterality: Left;   Right knee arthroscopy     x2   TEE WITHOUT CARDIOVERSION N/A 04/27/2020   Procedure: TRANSESOPHAGEAL ECHOCARDIOGRAM (TEE);  Surgeon: [name], MD;  Location: [facility];  Service: Open Heart Surgery;  Laterality: N/A;   TONSILLECTOMY     TOTAL ABDOMINAL HYSTERECTOMY     TOTAL KNEE ARTHROPLASTY Right 04/03/2017   Procedure: RIGHT TOTAL KNEE ARTHROPLASTY;  Surgeon: Latanya Maudlin, MD;  Location: [facility];  Service: Orthopedics;  Laterality: Right;    Family History  Problem Relation Age of Onset   Stroke Father    Diabetes Mother    Diabetes Sister    Colon cancer Neg Hx    Social History:  reports that she has never smoked. She has never used smokeless tobacco. She reports that she does not drink alcohol and does not use drugs.  Allergies:  Allergies  Allergen Reactions   Levofloxacin Anaphylaxis and Rash   Cardizem [Diltiazem] Other (See Comments)    Patient states that she could not think, felt that she was in the twilight zone. Arm discomfort.   Sulfa Antibiotics Itching   Diazepam Nausea Only   Lipitor [Atorvastatin] Other (See Comments)    Caused her body to be "out of whack"  - elevated sugar also.    Medications Prior to Admission  Medication Sig Dispense Refill   acetaminophen (TYLENOL) 650 MG CR tablet Take 650 mg by mouth every 8 (eight) hours as needed for pain.     acyclovir (ZOVIRAX) 400 MG tablet Take 400 mg by mouth daily.     ALPRAZolam (XANAX) 0.5 MG tablet Take 0.25 mg by mouth at bedtime as needed for anxiety or sleep.     aspirin EC 81 MG EC tablet Take 1 tablet (81 mg total) by mouth  daily. Swallow whole. (Patient taking differently: Take 81 mg by mouth 2 (two) times a week. Swallow whole.) 30 tablet 11   Biotin w/ Vitamins C & E (HAIR SKIN & NAILS GUMMIES PO) Take 2 capsules by mouth daily.     Cholecalciferol (VITAMIN D) 50 MCG (2000 UT) tablet Take 2,000 Units by mouth daily.     clopidogrel (PLAVIX) 75 MG tablet Take 1 tablet (75 mg total) by mouth daily. 90 tablet 3   clotrimazole (LOTRIMIN) 1 % cream Apply 1 application topically 2 (two) times daily as needed (lip cracking).     Cyanocobalamin (B-12 COMPLIANCE INJECTION IJ) Inject 1 Dose as directed every 30 (thirty) days.     Cyanocobalamin (B-12 PO) Take 1 tablet by mouth daily.     diclofenac Sodium (VOLTAREN) 1 % GEL Apply 1 application topically 4 (four) times daily as needed (pain).     ezetimibe (ZETIA)  10 MG tablet Take 1 tablet (10 mg total) by mouth daily. 90 tablet 3   glipiZIDE (GLUCOTROL XL) 5 MG 24 hr tablet Take 5-10 mg by mouth See admin instructions. Take 10 mg in the morning and 5 mg in the evening     levocetirizine (XYZAL) 5 MG tablet Take 5 mg by mouth daily.     Menthol-Methyl Salicylate (MUSCLE RUB EX) Apply 1 application topically daily as needed (muscle pain). [place] gel     metFORMIN (GLUCOPHAGE) 500 MG tablet Take 500 mg by mouth daily.     metoprolol tartrate (LOPRESSOR) 25 MG tablet Take 0.5 tablets (12.5 mg total) by mouth 2 (two) times daily. 90 tablet 3   Multiple Vitamins-Minerals (ADULT GUMMY PO) Take 2 capsules by mouth daily.     oxybutynin (DITROPAN-XL) 10 MG 24 hr tablet Take 10 mg by mouth daily.     pantoprazole (PROTONIX) 40 MG tablet Take 40 mg by mouth daily.     rosuvastatin (CRESTOR) 40 MG tablet Take 1 tablet (40 mg total) by mouth daily. 90 tablet 2   furosemide (LASIX) 40 MG tablet Take 40 mg every THIRD day until your weight is back to baseline (Patient not taking: Reported on 06/29/2021) 30 tablet 0   nitroGLYCERIN (NITROSTAT) 0.4 MG SL tablet Place 1 tablet (0.4 mg total)  under the tongue every 5 (five) minutes as needed for chest pain. 25 tablet 3   potassium chloride (KLOR-CON) 10 MEQ tablet Take 10 meq every THIRD day until weight is back to baseline. Take with Lasix (Patient not taking: Reported on 06/29/2021) 30 tablet 0   PRESCRIPTION MEDICATION Apply 1 application topically 2 (two) times a week. Dexameth Na Ph 0.4%     urea (CARMOL) 40 % CREA Apply 1 application topically daily. (Patient not taking: Reported on 06/29/2021) 120 g 2    Results for orders placed or performed during the hospital encounter of 07/11/21 (from the past 48 hour(s))  Glucose, capillary     Status: Abnormal   Collection Time: 07/11/21 10:01 AM  Result Value Ref Range   Glucose-Capillary 146 (H) 70 - 99 mg/dL    Comment: Glucose reference range applies only to samples taken after fasting for at least 8 hours.   No results found.  Review of Systems  Constitutional: Negative.   HENT:  Positive for trouble swallowing.   Eyes: Negative.   Respiratory: Negative.    Cardiovascular: Negative.   Gastrointestinal: Negative.   Endocrine: Negative.   Genitourinary: Negative.   Musculoskeletal: Negative.   Skin: Negative.   Allergic/Immunologic: Negative.   Neurological: Negative.   Hematological: Negative.   Psychiatric/Behavioral: Negative.     Blood pressure (!) 118/97, pulse 78, temperature 98.2 F (36.8 C), temperature source Oral, resp. rate 18, SpO2 97 %. Physical Exam  GENERAL: The patient is AO x3, in no acute distress. HEENT: Head is normocephalic and atraumatic. EOMI are intact. Mouth is well hydrated and without lesions. NECK: Supple. No masses LUNGS: Clear to auscultation. No presence of rhonchi/wheezing/rales. Adequate chest expansion HEART: RRR, normal s1 and s2. ABDOMEN: Soft, nontender, no guarding, no peritoneal signs, and nondistended. BS +. No masses. EXTREMITIES: Without any cyanosis, clubbing, rash, lesions or edema. NEUROLOGIC: AOx3, no focal motor  deficit. SKIN: no jaundice, no rashes  Assessment/Plan ELINOR KLEINE is a 76 y.o. female past medical history of c. Difficile, coronary artery disease status post CABG, hypertension, GERD, type III achalasia, type 2 diabetes and depression, who presents for follow  up of type III achalasia .  We will proceed with EGD with Botox injection.  Harvel Quale, MD 07/11/2021, 10:11 AM

## 2021-07-11 NOTE — Transfer of Care (Signed)
Immediate Anesthesia Transfer of Care Note  Patient: Katrina Castro  Procedure(s) Performed: ESOPHAGOGASTRODUODENOSCOPY (EGD) WITH PROPOFOL BOTOX INJECTION  Patient Location: Short Stay  Anesthesia Type:General  Level of Consciousness: awake, alert  and oriented  Airway & Oxygen Therapy: Patient Spontanous Breathing  Post-op Assessment: Report given to RN, Post -op Vital signs reviewed and stable and Patient moving all extremities X 4  Post vital signs: Reviewed and stable  Last Vitals:  Vitals Value Taken Time  BP    Temp    Pulse    Resp    SpO2      Last Pain:  Vitals:   07/11/21 1007  TempSrc: Oral  PainSc: 0-No pain      Patients Stated Pain Goal: 5 (75/[id number]/32 [id number])  Complications: No notable events documented.

## 2021-07-11 NOTE — Op Note (Addendum)
[facility] Patient Name: Katrina Castro Procedure Date: 07/11/2021 11:20 AM MRN: [id number] Date of Birth: 17-Jun-1946 Attending MD: Maylon Peppers ,  CSN: [id number] Age: 76 Admit Type: Outpatient Procedure:                Upper GI endoscopy Indications:              For botulinum toxin injection of achalasia Providers:                Maylon Peppers, Hughie Closs, RN, Charlsie Quest                            Theda Sers RN, RN, Randa Spike, Technician Referring MD:              Medicines:                Monitored Anesthesia Care Complications:            No immediate complications. Estimated Blood Loss:     Estimated blood loss: none. Procedure:                Pre-Anesthesia Assessment:                           - Prior to the procedure, a History and Physical                            was performed, and patient medications, allergies                            and sensitivities were reviewed. The patient's                            tolerance of previous anesthesia was reviewed.                           - The risks and benefits of the procedure and the                            sedation options and risks were discussed with the                            patient. All questions were answered and informed                            consent was obtained.                           - ASA Grade Assessment: II - A patient with mild                            systemic disease.                           After obtaining informed consent, the endoscope was                            passed under  direct vision. Throughout the                            procedure, the patient's blood pressure, pulse, and                            oxygen saturations were monitored continuously. The                            [id number]-[id number] ([id number]) scope was introduced through the                            mouth, and advanced to the second part of duodenum.                            The upper GI endoscopy  was accomplished without                            difficulty. The patient tolerated the procedure                            well. Scope In: 11:41:23 AM Scope Out: 11:48:32 AM Total Procedure Duration: 0 hours 7 minutes 9 seconds  Findings:      The examined esophagus was normal. Area was successfully injected with       100 IU (25 IU in each quadrant) botulinum toxin for management of       achalasia.      The entire examined stomach was normal.      The examined duodenum was normal. Impression:               - Normal esophagus. Injected.                           - Normal stomach.                           - Normal examined duodenum.                           - No specimens collected. Moderate Sedation:      Per Anesthesia Care Recommendation:           - Discharge patient to home (ambulatory).                           - Advance diet as tolerated.                           - Await pathology results.                           - Follow up with [facility] regarding POEM vs Heller                            myotomy. Procedure Code(s):        --- Professional ---  90383, Esophagogastroduodenoscopy, flexible,                            transoral; diagnostic, including collection of                            specimen(s) by brushing or washing, when performed                            (separate procedure) Diagnosis Code(s):        --- Professional ---                           K22.0, Achalasia of cardia CPT copyright 2019 [employer]. All rights reserved. The codes documented in this report are preliminary and upon coder review may  be revised to meet current compliance requirements. Maylon Peppers, MD Maylon Peppers,  07/11/2021 11:55:35 AM This report has been signed electronically. Number of Addenda: 0

## 2021-07-12 NOTE — Anesthesia Postprocedure Evaluation (Signed)
Anesthesia Post Note  Patient: Katrina Castro  Procedure(s) Performed: ESOPHAGOGASTRODUODENOSCOPY (EGD) WITH PROPOFOL BOTOX INJECTION  Patient location during evaluation: Phase II Anesthesia Type: General Level of consciousness: awake Pain management: pain level controlled Vital Signs Assessment: post-procedure vital signs reviewed and stable Respiratory status: spontaneous breathing and respiratory function stable Cardiovascular status: blood pressure returned to baseline and stable Postop Assessment: no headache and no apparent nausea or vomiting Anesthetic complications: no Comments: Late entry   No notable events documented.   Last Vitals:  Vitals:   07/11/21 1007 07/11/21 1153  BP: (!) 118/97 (!) 106/48  Pulse: 78 74  Resp: 18 16  Temp: 36.8 C (!) 36.3 C  SpO2: 97% 99%    Last Pain:  Vitals:   07/11/21 1153  TempSrc: Oral  PainSc: 0-No pain                 Louann Sjogren

## 2021-07-13 ENCOUNTER — Encounter (HOSPITAL_COMMUNITY): Payer: Self-pay | Admitting: Gastroenterology

## 2021-07-14 DIAGNOSIS — E1165 Type 2 diabetes mellitus with hyperglycemia: Secondary | ICD-10-CM | POA: Diagnosis not present

## 2021-07-14 DIAGNOSIS — I10 Essential (primary) hypertension: Secondary | ICD-10-CM | POA: Diagnosis not present

## 2021-07-14 DIAGNOSIS — Z299 Encounter for prophylactic measures, unspecified: Secondary | ICD-10-CM | POA: Diagnosis not present

## 2021-07-14 DIAGNOSIS — I4891 Unspecified atrial fibrillation: Secondary | ICD-10-CM | POA: Diagnosis not present

## 2021-07-14 DIAGNOSIS — I739 Peripheral vascular disease, unspecified: Secondary | ICD-10-CM | POA: Diagnosis not present

## 2021-07-17 DIAGNOSIS — M25561 Pain in right knee: Secondary | ICD-10-CM | POA: Diagnosis not present

## 2021-07-18 ENCOUNTER — Other Ambulatory Visit: Payer: Self-pay | Admitting: Student

## 2021-07-18 NOTE — Telephone Encounter (Signed)
This is a [facility] pt.  °

## 2021-07-20 ENCOUNTER — Other Ambulatory Visit: Payer: Self-pay | Admitting: Internal Medicine

## 2021-07-20 NOTE — Progress Notes (Signed)
Cardiology Office Note   Date:  07/21/2021   ID:  Katrina Castro, DOB 1945-07-17, MRN [id number]  PCP:  Glenda Chroman, MD  Cardiologist:   Dorris Carnes, MD    F/U of CAD    History of Present Illness: Katrina Castro is a 76 y.o. female with a history of CAD (s/p CABG in Nov 2021 (LIMA to LAD; SVG to PDA, PLSA; L radial to RI, OM1), HTN, HL and Type II DM   Post CABG had post op afib, UTI, diarrhea.   The pt has also had chronic chest pains    I saw the pt in June 2022   At that time she had diffuse complaints   R sided CP    Pain worse with motion    NOted some SOB   Notes some with moving   She was tender on palpitation at the time     I aw the pt in Aug 2022  She was seen by B Strader in October  The pt says she just has no energy   Doesn't do much     SHe is having R knee problems  Swollen.  Just had injection of steroids     Current Meds  Medication Sig   acetaminophen (TYLENOL) 650 MG CR tablet Take 650 mg by mouth every 8 (eight) hours as needed for pain.   acyclovir (ZOVIRAX) 400 MG tablet Take 400 mg by mouth daily.   ALPRAZolam (XANAX) 0.5 MG tablet Take 0.25 mg by mouth at bedtime as needed for anxiety or sleep.   Biotin w/ Vitamins C & E (HAIR SKIN & NAILS GUMMIES PO) Take 2 capsules by mouth daily.   Cholecalciferol (VITAMIN D) 50 MCG (2000 UT) tablet Take 2,000 Units by mouth daily.   clotrimazole (LOTRIMIN) 1 % cream Apply 1 application topically 2 (two) times daily as needed (lip cracking).   Cyanocobalamin (B-12 COMPLIANCE INJECTION IJ) Inject 1 Dose as directed every 30 (thirty) days.   Cyanocobalamin (B-12 PO) Take 1 tablet by mouth daily.   diclofenac Sodium (VOLTAREN) 1 % GEL Apply 1 application topically 4 (four) times daily as needed (pain).   ezetimibe (ZETIA) 10 MG tablet Take 1 tablet (10 mg total) by mouth daily.   glipiZIDE (GLUCOTROL XL) 5 MG 24 hr tablet Take 5-10 mg by mouth See admin instructions. Take 10 mg in the morning and 5 mg in the evening    levocetirizine (XYZAL) 5 MG tablet Take 5 mg by mouth daily.   Menthol-Methyl Salicylate (MUSCLE RUB EX) Apply 1 application topically daily as needed (muscle pain). [place] gel   metFORMIN (GLUCOPHAGE) 500 MG tablet Take 500 mg by mouth daily.   metoprolol tartrate (LOPRESSOR) 25 MG tablet Take 0.5 tablets (12.5 mg total) by mouth 2 (two) times daily.   Multiple Vitamins-Minerals (ADULT GUMMY PO) Take 2 capsules by mouth daily.   nitroGLYCERIN (NITROSTAT) 0.4 MG SL tablet Place 1 tablet (0.4 mg total) under the tongue every 5 (five) minutes as needed for chest pain.   oxybutynin (DITROPAN-XL) 10 MG 24 hr tablet Take 10 mg by mouth daily.   pantoprazole (PROTONIX) 40 MG tablet Take 40 mg by mouth daily.   rosuvastatin (CRESTOR) 40 MG tablet TAKE 1 TABLET BY MOUTH ONCE DAILY.   [DISCONTINUED] clopidogrel (PLAVIX) 75 MG tablet TAKE 1 TABLET ONCE DAILY.     Allergies:   Levofloxacin, Cardizem [diltiazem], Sulfa antibiotics, Diazepam, and Lipitor [atorvastatin]   Past Medical History:  Diagnosis  Date   Arthritis    CAD (coronary artery disease)    a. s/p CABG on 04/27/2020 with LIMA-LAD, seq SVG-PDA-PL, seq left radial-RI-OM   Concussion    2015   Depression    Diabetes ([place])    type 2   Dysphagia    Dysrhythmia    afib   GERD (gastroesophageal reflux disease)    Headache    History of bronchitis    Hypertension    Seasonal allergies    Toenail fungus     Past Surgical History:  Procedure Laterality Date   APPENDECTOMY     BACK SURGERY     x2   BOTOX INJECTION N/A 06/06/2016   Procedure: BOTOX INJECTION;  Surgeon: Rogene Houston, MD;  [facility];  Service: Endoscopy;  Laterality: N/A;   BOTOX INJECTION N/A 01/17/2021   Procedure: BOTOX INJECTION;  Surgeon: Harvel Quale, MD;  [facility];  Service: Gastroenterology;  Laterality: N/A;   BOTOX INJECTION N/A 07/11/2021   Procedure: BOTOX INJECTION;  Surgeon: Harvel Quale, MD;   [facility];  Service: Gastroenterology;  Laterality: N/A;   CHOLECYSTECTOMY     COLONOSCOPY N/A 11/05/2012   Procedure: COLONOSCOPY;  Surgeon: Rogene Houston, MD;  [facility];  Service: Endoscopy;  Laterality: N/A;  1030   COLONOSCOPY WITH PROPOFOL N/A 05/01/2019   Procedure: COLONOSCOPY WITH PROPOFOL;  Surgeon: Rogene Houston, MD;  [facility];  Service: Endoscopy;  Laterality: N/A;  11:20am   CORONARY ARTERY BYPASS GRAFT N/A 04/27/2020   Procedure: CORONARY ARTERY BYPASS GRAFTING (CABG) TIMES FIVE USING LEFT INTERNAL MAMMARY ARTERY, LEFT HARVESTED RADIAL ARTERY, RIGHT GREATER SAPHENOUS VEIN HARVESTED ENDOSCOPICALLY.;  Surgeon: [name], MD;  Location: [place];  Service: Open Heart Surgery;  Laterality: N/A;   ESOPHAGEAL DILATION N/A 07/06/2015   Procedure: ESOPHAGEAL DILATION;  Surgeon: Rogene Houston, MD;  [facility];  Service: Endoscopy;  Laterality: N/A;   ESOPHAGEAL DILATION N/A 06/06/2016   Procedure: ESOPHAGEAL DILATION;  Surgeon: Rogene Houston, MD;  [facility];  Service: Endoscopy;  Laterality: N/A;   ESOPHAGOGASTRODUODENOSCOPY N/A 02/12/2014   Procedure: ESOPHAGOGASTRODUODENOSCOPY (EGD);  Surgeon: Rogene Houston, MD;  [facility];  Service: Endoscopy;  Laterality: N/A;  150   ESOPHAGOGASTRODUODENOSCOPY N/A 07/06/2015   Procedure: ESOPHAGOGASTRODUODENOSCOPY (EGD);  Surgeon: Rogene Houston, MD;  [facility];  Service: Endoscopy;  Laterality: N/A;  1:25 - moved to 1/18 @ 10:30 - Ann to notify pt   ESOPHAGOGASTRODUODENOSCOPY (EGD) WITH ESOPHAGEAL DILATION N/A 07/25/2012   Procedure: ESOPHAGOGASTRODUODENOSCOPY (EGD) WITH ESOPHAGEAL DILATION;  Surgeon: Rogene Houston, MD;  [facility];  Service: Endoscopy;  Laterality: N/A;  325-rescheduled to [place] notified pt   ESOPHAGOGASTRODUODENOSCOPY (EGD) WITH PROPOFOL N/A 06/06/2016   Procedure: ESOPHAGOGASTRODUODENOSCOPY (EGD) WITH PROPOFOL;   Surgeon: Rogene Houston, MD;  [facility];  Service: Endoscopy;  Laterality: N/A;   ESOPHAGOGASTRODUODENOSCOPY (EGD) WITH PROPOFOL N/A 01/17/2021   Procedure: ESOPHAGOGASTRODUODENOSCOPY (EGD) WITH PROPOFOL;  Surgeon: Harvel Quale, MD;  [facility];  Service: Gastroenterology;  Laterality: N/A;  10:35   ESOPHAGOGASTRODUODENOSCOPY (EGD) WITH PROPOFOL N/A 07/11/2021   Procedure: ESOPHAGOGASTRODUODENOSCOPY (EGD) WITH PROPOFOL;  Surgeon: Harvel Quale, MD;  [facility];  Service: Gastroenterology;  Laterality: N/A;  11:15   EYE SURGERY     cataracts removed   Foot surgeries Bilateral    hammer toes  LEFT HEART CATH AND CORONARY ANGIOGRAPHY N/A 04/26/2020   Procedure: LEFT HEART CATH AND CORONARY ANGIOGRAPHY;  Surgeon: Martinique, Peter M, MD;  Location: [place] [facility];  Service: Cardiovascular;  Laterality: N/A;   MALONEY DILATION N/A 02/12/2014   Procedure: Venia Minks DILATION;  Surgeon: Rogene Houston, MD;  [facility];  Service: Endoscopy;  Laterality: N/A;   POLYPECTOMY  05/01/2019   Procedure: POLYPECTOMY;  Surgeon: Rogene Houston, MD;  [facility];  Service: Endoscopy;;  colon    RADIAL ARTERY HARVEST Left 04/27/2020   Procedure: LEFT RADIAL ARTERY HARVEST;  Surgeon: [name], MD;  Location: [place];  Service: Open Heart Surgery;  Laterality: Left;   Right knee arthroscopy     x2   TEE WITHOUT CARDIOVERSION N/A 04/27/2020   Procedure: TRANSESOPHAGEAL ECHOCARDIOGRAM (TEE);  Surgeon: [name], MD;  Location: [place];  Service: Open Heart Surgery;  Laterality: N/A;   TONSILLECTOMY     TOTAL ABDOMINAL HYSTERECTOMY     TOTAL KNEE ARTHROPLASTY Right 04/03/2017   Procedure: RIGHT TOTAL KNEE ARTHROPLASTY;  Surgeon: Latanya Maudlin, MD;  [facility];  Service: Orthopedics;  Laterality: Right;     Social History:  The patient  reports that she has never smoked. She has never used smokeless  tobacco. She reports that she does not drink alcohol and does not use drugs.   Family History:  The patient's family history includes Diabetes in her mother and sister; Stroke in her father.    ROS:  Please see the history of present illness. All other systems are reviewed and  Negative to the above problem except as noted.    PHYSICAL EXAM: VS:  BP 118/76    Pulse (!) 58    Ht 5\' 2"  (1.575 m)    Wt 152 lb (68.9 kg)    SpO2 96%    BMI 27.80 kg/m   GEN: Patient is in NAD  HEENT: normal  Neck: no JVD, Cardiac: RRR S1, S2.  No signficant murmurs    No LE edema  Respiratory:  clear to auscultation bilaterally,  GI: soft, nontender, nondistended, + BS  No hepatomegaly  MS: no deformity Moving all extremities   Skin: warm and dry, no rash Neuro:  Strength and sensation are intact Psych: euthymic mood, full affect   EKG:  EKG is not ordered today.   Echo  02/17/21   1. Left ventricular ejection fraction, by estimation, is 60 to 65%. The  left ventricle has normal function. The left ventricle has no regional  wall motion abnormalities. There is mild left ventricular hypertrophy.  Left ventricular diastolic parameters  are indeterminate.   2. Right ventricular systolic function is mildly ro moderately reduced.  The right ventricular size is mildly enlarged. Tricuspid regurgitation  signal is inadequate for assessing PA pressure.   3. The mitral valve is grossly normal. Trivial mitral valve  regurgitation.   4. The aortic valve was not well visualized. Aortic valve regurgitation  is not visualized.   5. The inferior vena cava is normal in size with greater than 50%  respiratory variability, suggesting right atrial pressure of 3 mmHg.    Lipid Panel    Component Value Date/Time   CHOL 110 07/21/2021 1458   CHOL 217 (H) 03/31/2020 1208   TRIG 167 (H) 07/21/2021 1458   HDL 38 (L) 07/21/2021 1458   HDL 38 (L) 03/31/2020 1208   CHOLHDL 2.9 07/21/2021 1458   VLDL 33 07/21/2021  3491    PHXTAVW 97 07/21/2021 1458   LDLCALC 144 (H) 03/31/2020 1208      Wt Readings from Last 3 Encounters:  07/21/21 152 lb (68.9 kg)  07/06/21 150 lb (68 kg)  04/13/21 152 lb (68.9 kg)      ASSESSMENT AND PLAN:  1  CAD   CABG in 2021   I am not convinced of any active angina   Follow   2  Fatigue/dyspnea  Continues to be a complaint    Volum status is OK   Follow      Will check CBC and TSH    3 HTN  BP is controlled con current regimen    4  HL   Will get lipids  Labs:  CBC, BMET, TSH, CBC   Current medicines are reviewed at length with the patient today.  The patient does not have concerns regarding medicines.  Signed, Dorris Carnes, MD  07/21/2021 11:17 PM    [facility] [address], [place], [place]  [address] Phone: [phone]; Fax: [phone]

## 2021-07-21 ENCOUNTER — Other Ambulatory Visit: Payer: Self-pay

## 2021-07-21 ENCOUNTER — Ambulatory Visit (INDEPENDENT_AMBULATORY_CARE_PROVIDER_SITE_OTHER): Payer: Medicare Other | Admitting: Internal Medicine

## 2021-07-21 ENCOUNTER — Other Ambulatory Visit (HOSPITAL_COMMUNITY)
Admission: RE | Admit: 2021-07-21 | Discharge: 2021-07-21 | Disposition: A | Discharge status: Home / Self Care | Payer: Medicare Other | Source: Ambulatory Visit | Attending: Internal Medicine | Admitting: Internal Medicine

## 2021-07-21 ENCOUNTER — Encounter: Payer: Self-pay | Admitting: Internal Medicine

## 2021-07-21 VITALS — BP 118/76 | HR 58 | Ht 62.0 in | Wt 152.0 lb

## 2021-07-21 DIAGNOSIS — I2510 Atherosclerotic heart disease of native coronary artery without angina pectoris: Secondary | ICD-10-CM

## 2021-07-21 DIAGNOSIS — R5383 Other fatigue: Secondary | ICD-10-CM | POA: Insufficient documentation

## 2021-07-21 DIAGNOSIS — R0602 Shortness of breath: Secondary | ICD-10-CM | POA: Insufficient documentation

## 2021-07-21 LAB — BASIC METABOLIC PANEL
Anion gap: 8 (ref 5–15)
BUN: 15 mg/dL (ref 8–23)
CO2: 28 mmol/L (ref 22–32)
Calcium: 9.7 mg/dL (ref 8.9–10.3)
Chloride: 105 mmol/L (ref 98–111)
Creatinine, Ser: 0.76 mg/dL (ref 0.44–1.00)
GFR, Estimated: >60 mL/min (ref >60)
Glucose, Bld: 118 mg/dL — ABNORMAL HIGH (ref 70–99)
Potassium: 4 mmol/L (ref 3.5–5.1)
Sodium: 141 mmol/L (ref 135–145)

## 2021-07-21 LAB — CBC
HCT: 41.4 % (ref 36.0–46.0)
Hemoglobin: 13 g/dL (ref 12.0–15.0)
MCH: 26.9 pg (ref 26.0–34.0)
MCHC: 31.4 g/dL (ref 30.0–36.0)
MCV: 85.5 fL (ref 80.0–100.0)
Platelets: 305 10*3/uL (ref 150–400)
RBC: 4.84 MIL/uL (ref 3.87–5.11)
RDW: 14.1 % (ref 11.5–15.5)
WBC: 8.9 10*3/uL (ref 4.0–10.5)
nRBC: 0 % (ref 0.0–0.2)

## 2021-07-21 LAB — LIPID PANEL
Cholesterol: 110 mg/dL (ref 0–200)
HDL: 38 mg/dL — ABNORMAL LOW (ref >40)
LDL Cholesterol: 39 mg/dL (ref 0–99)
Total CHOL/HDL Ratio: 2.9 RATIO
Triglycerides: 167 mg/dL — ABNORMAL HIGH (ref <150)
VLDL: 33 mg/dL (ref 0–40)

## 2021-07-21 LAB — TSH: TSH: 1.846 u[IU]/mL (ref 0.350–4.500)

## 2021-07-21 MED ORDER — ASPIRIN 81 MG PO TBEC: 81.0000 mg | DELAYED_RELEASE_TABLET | Freq: Every day | ORAL | 11 refills | Status: AC

## 2021-07-21 MED ORDER — ASPIRIN 81 MG PO TBEC: 81.0000 mg | DELAYED_RELEASE_TABLET | Freq: Every day | ORAL | 11 refills | Status: DC

## 2021-07-21 NOTE — Patient Instructions (Signed)
Medication Instructions:  STOP Plavix  Take Aspirin 81 mg daily   Labwork: cbc,lipid,bmet,tsh  Testing/Procedures: None today  Follow-Up: June  Any Other Special Instructions Will Be Listed Below (If Applicable).  If you need a refill on your cardiac medications before your next appointment, please call your pharmacy.

## 2021-07-25 DIAGNOSIS — E538 Deficiency of other specified B group vitamins: Secondary | ICD-10-CM | POA: Diagnosis not present

## 2021-07-26 ENCOUNTER — Ambulatory Visit (INDEPENDENT_AMBULATORY_CARE_PROVIDER_SITE_OTHER): Payer: Medicare Other | Admitting: Endocrinology

## 2021-07-26 ENCOUNTER — Other Ambulatory Visit: Payer: Self-pay

## 2021-07-26 VITALS — BP 160/80 | HR 69 | Ht 62.0 in | Wt 152.2 lb

## 2021-07-26 DIAGNOSIS — E119 Type 2 diabetes mellitus without complications: Secondary | ICD-10-CM | POA: Diagnosis not present

## 2021-07-26 LAB — POCT GLYCOSYLATED HEMOGLOBIN (HGB A1C): Hemoglobin A1C: 8 % — AB (ref 4.0–5.6)

## 2021-07-26 MED ORDER — GLIPIZIDE ER 5 MG PO TB24: 5.0000 mg | ORAL_TABLET | ORAL | 3 refills | Status: DC

## 2021-07-26 MED ORDER — METFORMIN HCL ER 500 MG PO TB24: 500.0000 mg | ORAL_TABLET | Freq: Every day | ORAL | 3 refills | Status: DC

## 2021-07-26 MED ORDER — RYBELSUS 3 MG PO TABS: 3.0000 mg | ORAL_TABLET | ORAL | 3 refills | Status: DC

## 2021-07-26 NOTE — Progress Notes (Signed)
Subjective:    Patient ID: Katrina Castro, female    DOB: [birth year], 76 y.o.   MRN: [id number]  HPI Some hx is from dtr, due to pt's memory loss (pt denies this).  pt is referred by Dr Harrington Challenger, for diabetes.  Pt states DM was dx'ed in 0867; it is complicated by CAD and PN; he has never been on insulin; pt says her diet and exercise are fair; she has never had GDM, pancreatitis, pancreatic surgery, severe hypoglycemia or DKA.  She reports weight gain.  She takes 2 oral meds.  She says cbg varies from 75-300.  It is in general highest after she forgets meds.   Past Medical History:  Diagnosis Date   Arthritis    CAD (coronary artery disease)    a. s/p CABG on 04/27/2020 with LIMA-LAD, seq SVG-PDA-PL, seq left radial-RI-OM   Concussion    2015   Depression    Diabetes ([place])    type 2   Dysphagia    Dysrhythmia    afib   GERD (gastroesophageal reflux disease)    Headache    History of bronchitis    Hypertension    Seasonal allergies    Toenail fungus     Past Surgical History:  Procedure Laterality Date   APPENDECTOMY     BACK SURGERY     x2   BOTOX INJECTION N/A 06/06/2016   Procedure: BOTOX INJECTION;  Surgeon: Rogene Houston, MD;  [facility];  Service: Endoscopy;  Laterality: N/A;   BOTOX INJECTION N/A 01/17/2021   Procedure: BOTOX INJECTION;  Surgeon: Harvel Quale, MD;  [facility];  Service: Gastroenterology;  Laterality: N/A;   BOTOX INJECTION N/A 07/11/2021   Procedure: BOTOX INJECTION;  Surgeon: Harvel Quale, MD;  [facility];  Service: Gastroenterology;  Laterality: N/A;   CHOLECYSTECTOMY     COLONOSCOPY N/A 11/05/2012   Procedure: COLONOSCOPY;  Surgeon: Rogene Houston, MD;  [facility];  Service: Endoscopy;  Laterality: N/A;  1030   COLONOSCOPY WITH PROPOFOL N/A 05/01/2019   Procedure: COLONOSCOPY WITH PROPOFOL;  Surgeon: Rogene Houston, MD;  [facility];  Service: Endoscopy;   Laterality: N/A;  11:20am   CORONARY ARTERY BYPASS GRAFT N/A 04/27/2020   Procedure: CORONARY ARTERY BYPASS GRAFTING (CABG) TIMES FIVE USING LEFT INTERNAL MAMMARY ARTERY, LEFT HARVESTED RADIAL ARTERY, RIGHT GREATER SAPHENOUS VEIN HARVESTED ENDOSCOPICALLY.;  Surgeon: [name], MD;  Location: [place];  Service: Open Heart Surgery;  Laterality: N/A;   ESOPHAGEAL DILATION N/A 07/06/2015   Procedure: ESOPHAGEAL DILATION;  Surgeon: Rogene Houston, MD;  [facility];  Service: Endoscopy;  Laterality: N/A;   ESOPHAGEAL DILATION N/A 06/06/2016   Procedure: ESOPHAGEAL DILATION;  Surgeon: Rogene Houston, MD;  [facility];  Service: Endoscopy;  Laterality: N/A;   ESOPHAGOGASTRODUODENOSCOPY N/A 02/12/2014   Procedure: ESOPHAGOGASTRODUODENOSCOPY (EGD);  Surgeon: Rogene Houston, MD;  [facility];  Service: Endoscopy;  Laterality: N/A;  150   ESOPHAGOGASTRODUODENOSCOPY N/A 07/06/2015   Procedure: ESOPHAGOGASTRODUODENOSCOPY (EGD);  Surgeon: Rogene Houston, MD;  [facility];  Service: Endoscopy;  Laterality: N/A;  1:25 - moved to 1/18 @ 10:30 - Ann to notify pt   ESOPHAGOGASTRODUODENOSCOPY (EGD) WITH ESOPHAGEAL DILATION N/A 07/25/2012   Procedure: ESOPHAGOGASTRODUODENOSCOPY (EGD) WITH ESOPHAGEAL DILATION;  Surgeon: Rogene Houston, MD;  [facility];  Service: Endoscopy;  Laterality: N/A;  325-rescheduled to [place] notified  pt   ESOPHAGOGASTRODUODENOSCOPY (EGD) WITH PROPOFOL N/A 06/06/2016   Procedure: ESOPHAGOGASTRODUODENOSCOPY (EGD) WITH PROPOFOL;  Surgeon: Rogene Houston, MD;  [facility];  Service: Endoscopy;  Laterality: N/A;   ESOPHAGOGASTRODUODENOSCOPY (EGD) WITH PROPOFOL N/A 01/17/2021   Procedure: ESOPHAGOGASTRODUODENOSCOPY (EGD) WITH PROPOFOL;  Surgeon: Harvel Quale, MD;  [facility];  Service: Gastroenterology;  Laterality: N/A;  10:35   ESOPHAGOGASTRODUODENOSCOPY (EGD) WITH PROPOFOL N/A 07/11/2021    Procedure: ESOPHAGOGASTRODUODENOSCOPY (EGD) WITH PROPOFOL;  Surgeon: Harvel Quale, MD;  [facility];  Service: Gastroenterology;  Laterality: N/A;  11:15   EYE SURGERY     cataracts removed   Foot surgeries Bilateral    hammer toes   LEFT HEART CATH AND CORONARY ANGIOGRAPHY N/A 04/26/2020   Procedure: LEFT HEART CATH AND CORONARY ANGIOGRAPHY;  Surgeon: Martinique, Peter M, MD;  Location: [place] [facility];  Service: Cardiovascular;  Laterality: N/A;   MALONEY DILATION N/A 02/12/2014   Procedure: Venia Minks DILATION;  Surgeon: Rogene Houston, MD;  [facility];  Service: Endoscopy;  Laterality: N/A;   POLYPECTOMY  05/01/2019   Procedure: POLYPECTOMY;  Surgeon: Rogene Houston, MD;  [facility];  Service: Endoscopy;;  colon    RADIAL ARTERY HARVEST Left 04/27/2020   Procedure: LEFT RADIAL ARTERY HARVEST;  Surgeon: [name], MD;  Location: [place];  Service: Open Heart Surgery;  Laterality: Left;   Right knee arthroscopy     x2   TEE WITHOUT CARDIOVERSION N/A 04/27/2020   Procedure: TRANSESOPHAGEAL ECHOCARDIOGRAM (TEE);  Surgeon: [name], MD;  Location: [place];  Service: Open Heart Surgery;  Laterality: N/A;   TONSILLECTOMY     TOTAL ABDOMINAL HYSTERECTOMY     TOTAL KNEE ARTHROPLASTY Right 04/03/2017   Procedure: RIGHT TOTAL KNEE ARTHROPLASTY;  Surgeon: Latanya Maudlin, MD;  [facility];  Service: Orthopedics;  Laterality: Right;    Social History   Socioeconomic History   Marital status: Divorced    Spouse name: Not on file   Number of children: Not on file   Years of education: Not on file   Highest education level: Not on file  Occupational History   Not on file  Tobacco Use   Smoking status: Never   Smokeless tobacco: Never  Vaping Use   Vaping Use: Never used  Substance and Sexual Activity   Alcohol use: No    Alcohol/week: 0.0 standard drinks    Comment: socially    Drug use: No   Sexual activity: Not  Currently    Partners: Male    Birth control/protection: None  Other Topics Concern   Not on file  Social History Narrative   Not on file   Social Determinants of Health   Financial Resource Strain: Not on file  Food Insecurity: Not on file  Transportation Needs: Not on file  Physical Activity: Not on file  Stress: Not on file  Social Connections: Not on file  Intimate Partner Violence: Not on file    Current Outpatient Medications on File Prior to Visit  Medication Sig Dispense Refill   acetaminophen (TYLENOL) 650 MG CR tablet Take 650 mg by mouth every 8 (eight) hours as needed for pain.     acyclovir (ZOVIRAX) 400 MG tablet Take 400 mg by mouth daily.     ALPRAZolam (XANAX) 0.5 MG tablet Take 0.25 mg by mouth at bedtime as needed for anxiety or sleep.     aspirin 81 MG EC tablet  Take 1 tablet (81 mg total) by mouth daily. Swallow whole. 30 tablet 11   Biotin w/ Vitamins C & E (HAIR SKIN & NAILS GUMMIES PO) Take 2 capsules by mouth daily.     Cholecalciferol (VITAMIN D) 50 MCG (2000 UT) tablet Take 2,000 Units by mouth daily.     clotrimazole (LOTRIMIN) 1 % cream Apply 1 application topically 2 (two) times daily as needed (lip cracking).     Cyanocobalamin (B-12 COMPLIANCE INJECTION IJ) Inject 1 Dose as directed every 30 (thirty) days.     Cyanocobalamin (B-12 PO) Take 1 tablet by mouth daily.     diclofenac Sodium (VOLTAREN) 1 % GEL Apply 1 application topically 4 (four) times daily as needed (pain).     ezetimibe (ZETIA) 10 MG tablet Take 1 tablet (10 mg total) by mouth daily. 90 tablet 3   furosemide (LASIX) 40 MG tablet Take 40 mg every THIRD day until your weight is back to baseline 30 tablet 0   levocetirizine (XYZAL) 5 MG tablet Take 5 mg by mouth daily.     Menthol-Methyl Salicylate (MUSCLE RUB EX) Apply 1 application topically daily as needed (muscle pain). [place] gel     Multiple Vitamins-Minerals (ADULT GUMMY PO) Take 2 capsules by mouth daily.     nitroGLYCERIN  (NITROSTAT) 0.4 MG SL tablet Place 1 tablet (0.4 mg total) under the tongue every 5 (five) minutes as needed for chest pain. 25 tablet 3   oxybutynin (DITROPAN-XL) 10 MG 24 hr tablet Take 10 mg by mouth daily.     pantoprazole (PROTONIX) 40 MG tablet Take 40 mg by mouth daily.     potassium chloride (KLOR-CON) 10 MEQ tablet Take 10 meq every THIRD day until weight is back to baseline. Take with Lasix 30 tablet 0   PRESCRIPTION MEDICATION Apply 1 application topically 2 (two) times a week. Dexameth Na Ph 0.4%     rosuvastatin (CRESTOR) 40 MG tablet TAKE 1 TABLET BY MOUTH ONCE DAILY. 30 tablet 2   urea (CARMOL) 40 % CREA Apply 1 application topically daily. 120 g 2   metoprolol tartrate (LOPRESSOR) 25 MG tablet Take 0.5 tablets (12.5 mg total) by mouth 2 (two) times daily. 90 tablet 3   No current facility-administered medications on file prior to visit.      Family History  Problem Relation Age of Onset   Stroke Father    Diabetes Mother    Diabetes Sister    Colon cancer Neg Hx     BP (!) 160/80    Pulse 69    Ht 5\' 2"  (1.575 m)    Wt 152 lb 3.2 oz (69 kg)    SpO2 98%    BMI 27.84 kg/m     Review of Systems denies sob n/v.       Objective:   Physical Exam VITAL SIGNS:  See vs page GENERAL: no distress Pulses: dorsalis pedis intact bilat.   MSK: no deformity of the feet CV: no leg edema Skin:  no ulcer on the feet.  normal color and temp on the feet. Neuro: sensation is intact to touch on the feet Ext: there is bilateral onychomycosis of the toenails.    Lab Results  Component Value Date   CREATININE 0.76 07/21/2021   BUN 15 07/21/2021   NA 141 07/21/2021   K 4.0 07/21/2021   CL 105 07/21/2021   CO2 28 07/21/2021    Lab Results  Component Value Date   HGBA1C 8.0 (A) 07/26/2021  Assessment & Plan:  Type 2 DM: uncontrolled  Patient Instructions  good diet and exercise significantly improve the control of your diabetes.  please let me know if you wish to be  referred to a dietician.  high blood sugar is very risky to your health.  you should see an eye doctor and dentist every year.  It is very important to get all recommended vaccinations.  Controlling your blood pressure and cholesterol drastically reduces the damage diabetes does to your body.  Those who smoke should quit.  Please discuss these with your doctor.  check your blood sugar once a day.  vary the time of day when you check, between before the 3 meals, and at bedtime.  also check if you have symptoms of your blood sugar being too high or too low.  please keep a record of the readings and bring it to your next appointment here (or you can bring the meter itself).  You can write it on any piece of paper.  please call us sooner if your blood sugar goes below 70, or if most of your readings are over 200.   Our goals are to get the A1c below 7, and to phase our the glipizide.   We will need to take this complex situation in stages.   I have sent these prescriptions to your pharmacy.   Glipizide, metformin-XR, and Rybelsus.  All 1 pill in the morning.   Please come back for a follow-up appointment in 1 month.

## 2021-07-26 NOTE — Patient Instructions (Addendum)
good diet and exercise significantly improve the control of your diabetes.  please let me know if you wish to be referred to a dietician.  high blood sugar is very risky to your health.  you should see an eye doctor and dentist every year.  It is very important to get all recommended vaccinations.  Controlling your blood pressure and cholesterol drastically reduces the damage diabetes does to your body.  Those who smoke should quit.  Please discuss these with your doctor.  check your blood sugar once a day.  vary the time of day when you check, between before the 3 meals, and at bedtime.  also check if you have symptoms of your blood sugar being too high or too low.  please keep a record of the readings and bring it to your next appointment here (or you can bring the meter itself).  You can write it on any piece of paper.  please call us sooner if your blood sugar goes below 70, or if most of your readings are over 200.   Our goals are to get the A1c below 7, and to phase our the glipizide.   We will need to take this complex situation in stages.   I have sent these prescriptions to your pharmacy.   Glipizide, metformin-XR, and Rybelsus.  All 1 pill in the morning.   Please come back for a follow-up appointment in 1 month.

## 2021-07-27 ENCOUNTER — Ambulatory Visit: Payer: Medicare Other | Admitting: Podiatry

## 2021-07-27 ENCOUNTER — Telehealth: Payer: Self-pay | Admitting: Endocrinology

## 2021-07-27 MED ORDER — GLIPIZIDE ER 5 MG PO TB24: 5.0000 mg | ORAL_TABLET | ORAL | 3 refills | Status: DC

## 2021-07-27 NOTE — Telephone Encounter (Signed)
Spoke with the pharmacy to provide clarification.

## 2021-07-27 NOTE — Telephone Encounter (Signed)
Pt is calling in stating that she would like to know if she is to take only one pill of Rx glipizide (GLUCOTROL XL) 5 MG due to the instructions is not clear and her pharmacy is wanting her to have the office call them to give them the clarification so that they are able to prepackage her medication as needed.  Rx was sent in on yesterday (07/27/2021).

## 2021-07-31 DIAGNOSIS — M25561 Pain in right knee: Secondary | ICD-10-CM | POA: Diagnosis not present

## 2021-08-04 ENCOUNTER — Telehealth: Payer: Self-pay

## 2021-08-04 ENCOUNTER — Other Ambulatory Visit: Payer: Self-pay | Admitting: Endocrinology

## 2021-08-04 DIAGNOSIS — E119 Type 2 diabetes mellitus without complications: Secondary | ICD-10-CM

## 2021-08-04 DIAGNOSIS — M25561 Pain in right knee: Secondary | ICD-10-CM | POA: Diagnosis not present

## 2021-08-04 MED ORDER — RYBELSUS 7 MG PO TABS: 7.0000 mg | ORAL_TABLET | Freq: Every day | ORAL | 3 refills | Status: DC

## 2021-08-04 NOTE — Telephone Encounter (Signed)
Patient called wanting clarification on which meds she is currently taking as well as when. Patient informed that as of last OV on 2/08 her current regimen is Glipizide, metformin-XR, and Rybelsus.  All 1 pill in the morning.  Patient expressed understand.

## 2021-08-04 NOTE — Telephone Encounter (Signed)
Spoke with pt and she stated that her BS are still running high. Yesterday it was 200 and today it is 172 and all she had was a protein shake. She is taking gll meds as prescribed by you.  Please Adsvise

## 2021-08-09 NOTE — Progress Notes (Signed)
My note says fatigue and SOB    That is why I ordered a CBC and a TSH

## 2021-08-10 ENCOUNTER — Ambulatory Visit: Payer: Medicare Other | Admitting: Podiatry

## 2021-08-10 ENCOUNTER — Other Ambulatory Visit: Payer: Self-pay

## 2021-08-10 DIAGNOSIS — B351 Tinea unguium: Secondary | ICD-10-CM

## 2021-08-10 DIAGNOSIS — R234 Changes in skin texture: Secondary | ICD-10-CM

## 2021-08-10 DIAGNOSIS — M79674 Pain in right toe(s): Secondary | ICD-10-CM | POA: Diagnosis not present

## 2021-08-10 DIAGNOSIS — L84 Corns and callosities: Secondary | ICD-10-CM

## 2021-08-10 DIAGNOSIS — M79675 Pain in left toe(s): Secondary | ICD-10-CM | POA: Diagnosis not present

## 2021-08-10 DIAGNOSIS — E1140 Type 2 diabetes mellitus with diabetic neuropathy, unspecified: Secondary | ICD-10-CM | POA: Diagnosis not present

## 2021-08-10 DIAGNOSIS — B353 Tinea pedis: Secondary | ICD-10-CM | POA: Diagnosis not present

## 2021-08-10 MED ORDER — CLOTRIMAZOLE-BETAMETHASONE 1-0.05 % EX CREA: 1.0000 "application " | TOPICAL_CREAM | Freq: Two times a day (BID) | CUTANEOUS | 0 refills | Status: DC

## 2021-08-10 NOTE — Progress Notes (Signed)
°  Subjective:  Patient ID: Katrina Castro, female    DOB: [birth year],  MRN: [id number]  Chief Complaint  Patient presents with   Callouses   Plantar Warts    76 y.o. female returns with the above complaint. History confirmed with patient.  Doing okay.  Blood sugar has been elevated due to steroids in the knee.  Nails are thickened elongated and the calluses are thick and discomfort again.  She also has cracking and fissuring of her heels. Objective:  Physical Exam: warm, good capillary refill, no trophic changes or ulcerative lesions, normal DP and PT pulses.  She does have diffuse loss of protective sensation bilaterally.  Bilaterally there is thickening of all the toenails with elongation, discoloration and subungual debris.  Bilateral heel is fissuring and dry scaling skin Left Foot: Hallux varus and medial deviation of the digits.  Healed surgical scars on the medial foot and dorsal second and third toes.  Atrophy of the plantar metatarsal fat pad  Right Foot:  Atrophy of the plantar metatarsal fat pad callus on the plantar third toe  Radiographs: Left foot hallux varus and medial deviation of the second and third MTPJ's with digital IPJ fusions of the second and third toes Assessment:   1. Callus of foot   2. Controlled type 2 diabetes with neuropathy (HCC)   3. Pain due to onychomycosis of toenails of both feet   4. Tinea pedis of both feet   5. Fissure in skin of foot      Plan:  Patient was evaluated and treated and all questions answered.  Fissure on the heels is likely tinea pedis.  I prescribed her Lotrisone cream and discussed etiology and treatment options of this.  She will use this once daily.  Sent to pharmacy  Discussed the etiology and treatment options for the condition in detail with the patient. Educated patient on the topical and oral treatment options for mycotic nails. Recommended debridement of the nails today. Sharp and mechanical debridement performed of  all painful and mycotic nails today. Nails debrided in length and thickness using a nail nipper to level of comfort. Discussed treatment options including appropriate shoe gear. Follow up as needed for painful nails.   All symptomatic hyperkeratoses were safely debrided with a sterile #15 blade to patient's level of comfort without incident. We discussed preventative and palliative care of these lesions including supportive and accommodative shoegear, padding, prefabricated and custom molded accommodative orthoses, use of a pumice stone and lotions/creams daily.  Patient educated on diabetes. Discussed proper diabetic foot care and discussed risks and complications of disease. Educated patient in depth on reasons to return to the office immediately should he/she discover anything concerning or new on the feet. All questions answered. Discussed proper shoes as well.     Return in about 3 months (around 11/07/2021) for at risk diabetic foot care.

## 2021-08-11 DIAGNOSIS — M25561 Pain in right knee: Secondary | ICD-10-CM | POA: Diagnosis not present

## 2021-08-12 ENCOUNTER — Other Ambulatory Visit: Payer: Self-pay | Admitting: Internal Medicine

## 2021-08-14 DIAGNOSIS — I10 Essential (primary) hypertension: Secondary | ICD-10-CM | POA: Diagnosis not present

## 2021-08-14 DIAGNOSIS — Z299 Encounter for prophylactic measures, unspecified: Secondary | ICD-10-CM | POA: Diagnosis not present

## 2021-08-14 DIAGNOSIS — D6869 Other thrombophilia: Secondary | ICD-10-CM | POA: Diagnosis not present

## 2021-08-14 DIAGNOSIS — J069 Acute upper respiratory infection, unspecified: Secondary | ICD-10-CM | POA: Diagnosis not present

## 2021-08-21 DIAGNOSIS — M25561 Pain in right knee: Secondary | ICD-10-CM | POA: Diagnosis not present

## 2021-08-22 DIAGNOSIS — E538 Deficiency of other specified B group vitamins: Secondary | ICD-10-CM | POA: Diagnosis not present

## 2021-08-23 ENCOUNTER — Ambulatory Visit (INDEPENDENT_AMBULATORY_CARE_PROVIDER_SITE_OTHER): Payer: Medicare Other | Admitting: Endocrinology

## 2021-08-23 ENCOUNTER — Encounter: Payer: Self-pay | Admitting: Endocrinology

## 2021-08-23 ENCOUNTER — Other Ambulatory Visit: Payer: Self-pay

## 2021-08-23 VITALS — BP 136/70 | HR 74 | Ht 62.0 in | Wt 149.0 lb

## 2021-08-23 DIAGNOSIS — E119 Type 2 diabetes mellitus without complications: Secondary | ICD-10-CM | POA: Diagnosis not present

## 2021-08-23 MED ORDER — GLIPIZIDE ER 2.5 MG PO TB24: 2.5000 mg | ORAL_TABLET | Freq: Every day | ORAL | 3 refills | Status: DC

## 2021-08-23 MED ORDER — RYBELSUS 14 MG PO TABS: 14.0000 mg | ORAL_TABLET | Freq: Every day | ORAL | 3 refills | Status: DC

## 2021-08-23 MED ORDER — GLIPIZIDE ER 5 MG PO TB24: 5.0000 mg | ORAL_TABLET | Freq: Every day | ORAL | 3 refills | Status: DC

## 2021-08-23 NOTE — Progress Notes (Signed)
Subjective:    Patient ID: Katrina Castro, female    DOB: [birth year], 76 y.o.   MRN: [id number]  HPI Pt returns for f/u of diabetes mellitus: DM type: 2 Dx'ed: 6967 Complications: CAD and PN Therapy: 2 oral meds GDM: never DKA: never Severe hypoglycemia: never Pancreatitis: never Pancreatic imaging: large cyst on CT and MRI.  SDOH: none Other: she has never been on insulin Interval history: no cbg record, but states cbg's vary from 140-200.  She takes meds as rx'ed.  She increased Rybelsus to 7/d, just 1 week ago.  HB is mild.   Past Medical History:  Diagnosis Date   Arthritis    CAD (coronary artery disease)    a. s/p CABG on 04/27/2020 with LIMA-LAD, seq SVG-PDA-PL, seq left radial-RI-OM   Concussion    2015   Depression    Diabetes ([place])    type 2   Dysphagia    Dysrhythmia    afib   GERD (gastroesophageal reflux disease)    Headache    History of bronchitis    Hypertension    Seasonal allergies    Toenail fungus     Past Surgical History:  Procedure Laterality Date   APPENDECTOMY     BACK SURGERY     x2   BOTOX INJECTION N/A 06/06/2016   Procedure: BOTOX INJECTION;  Surgeon: Rogene Houston, MD;  [facility];  Service: Endoscopy;  Laterality: N/A;   BOTOX INJECTION N/A 01/17/2021   Procedure: BOTOX INJECTION;  Surgeon: Harvel Quale, MD;  [facility];  Service: Gastroenterology;  Laterality: N/A;   BOTOX INJECTION N/A 07/11/2021   Procedure: BOTOX INJECTION;  Surgeon: Harvel Quale, MD;  [facility];  Service: Gastroenterology;  Laterality: N/A;   CHOLECYSTECTOMY     COLONOSCOPY N/A 11/05/2012   Procedure: COLONOSCOPY;  Surgeon: Rogene Houston, MD;  [facility];  Service: Endoscopy;  Laterality: N/A;  1030   COLONOSCOPY WITH PROPOFOL N/A 05/01/2019   Procedure: COLONOSCOPY WITH PROPOFOL;  Surgeon: Rogene Houston, MD;  [facility];  Service: Endoscopy;  Laterality: N/A;   11:20am   CORONARY ARTERY BYPASS GRAFT N/A 04/27/2020   Procedure: CORONARY ARTERY BYPASS GRAFTING (CABG) TIMES FIVE USING LEFT INTERNAL MAMMARY ARTERY, LEFT HARVESTED RADIAL ARTERY, RIGHT GREATER SAPHENOUS VEIN HARVESTED ENDOSCOPICALLY.;  Surgeon: [name], MD;  Location: [place];  Service: Open Heart Surgery;  Laterality: N/A;   ESOPHAGEAL DILATION N/A 07/06/2015   Procedure: ESOPHAGEAL DILATION;  Surgeon: Rogene Houston, MD;  [facility];  Service: Endoscopy;  Laterality: N/A;   ESOPHAGEAL DILATION N/A 06/06/2016   Procedure: ESOPHAGEAL DILATION;  Surgeon: Rogene Houston, MD;  [facility];  Service: Endoscopy;  Laterality: N/A;   ESOPHAGOGASTRODUODENOSCOPY N/A 02/12/2014   Procedure: ESOPHAGOGASTRODUODENOSCOPY (EGD);  Surgeon: Rogene Houston, MD;  [facility];  Service: Endoscopy;  Laterality: N/A;  150   ESOPHAGOGASTRODUODENOSCOPY N/A 07/06/2015   Procedure: ESOPHAGOGASTRODUODENOSCOPY (EGD);  Surgeon: Rogene Houston, MD;  [facility];  Service: Endoscopy;  Laterality: N/A;  1:25 - moved to 1/18 @ 10:30 - Ann to notify pt   ESOPHAGOGASTRODUODENOSCOPY (EGD) WITH ESOPHAGEAL DILATION N/A 07/25/2012   Procedure: ESOPHAGOGASTRODUODENOSCOPY (EGD) WITH ESOPHAGEAL DILATION;  Surgeon: Rogene Houston, MD;  [facility];  Service: Endoscopy;  Laterality: N/A;  325-rescheduled to [place] notified pt   ESOPHAGOGASTRODUODENOSCOPY (EGD) WITH PROPOFOL N/A 06/06/2016   Procedure: ESOPHAGOGASTRODUODENOSCOPY (  EGD) WITH PROPOFOL;  Surgeon: Rogene Houston, MD;  [facility];  Service: Endoscopy;  Laterality: N/A;   ESOPHAGOGASTRODUODENOSCOPY (EGD) WITH PROPOFOL N/A 01/17/2021   Procedure: ESOPHAGOGASTRODUODENOSCOPY (EGD) WITH PROPOFOL;  Surgeon: Harvel Quale, MD;  [facility];  Service: Gastroenterology;  Laterality: N/A;  10:35   ESOPHAGOGASTRODUODENOSCOPY (EGD) WITH PROPOFOL N/A 07/11/2021   Procedure:  ESOPHAGOGASTRODUODENOSCOPY (EGD) WITH PROPOFOL;  Surgeon: Harvel Quale, MD;  [facility];  Service: Gastroenterology;  Laterality: N/A;  11:15   EYE SURGERY     cataracts removed   Foot surgeries Bilateral    hammer toes   LEFT HEART CATH AND CORONARY ANGIOGRAPHY N/A 04/26/2020   Procedure: LEFT HEART CATH AND CORONARY ANGIOGRAPHY;  Surgeon: Martinique, Peter M, MD;  Location: [facility];  Service: Cardiovascular;  Laterality: N/A;   MALONEY DILATION N/A 02/12/2014   Procedure: Venia Minks DILATION;  Surgeon: Rogene Houston, MD;  [facility];  Service: Endoscopy;  Laterality: N/A;   POLYPECTOMY  05/01/2019   Procedure: POLYPECTOMY;  Surgeon: Rogene Houston, MD;  [facility];  Service: Endoscopy;;  colon    RADIAL ARTERY HARVEST Left 04/27/2020   Procedure: LEFT RADIAL ARTERY HARVEST;  Surgeon: [name], MD;  Location: [facility];  Service: Open Heart Surgery;  Laterality: Left;   Right knee arthroscopy     x2   TEE WITHOUT CARDIOVERSION N/A 04/27/2020   Procedure: TRANSESOPHAGEAL ECHOCARDIOGRAM (TEE);  Surgeon: [name], MD;  Location: [facility];  Service: Open Heart Surgery;  Laterality: N/A;   TONSILLECTOMY     TOTAL ABDOMINAL HYSTERECTOMY     TOTAL KNEE ARTHROPLASTY Right 04/03/2017   Procedure: RIGHT TOTAL KNEE ARTHROPLASTY;  Surgeon: Latanya Maudlin, MD;  Location: [facility];  Service: Orthopedics;  Laterality: Right;    Social History   Socioeconomic History   Marital status: Divorced    Spouse name: Not on file   Number of children: Not on file   Years of education: Not on file   Highest education level: Not on file  Occupational History   Not on file  Tobacco Use   Smoking status: Never   Smokeless tobacco: Never  Vaping Use   Vaping Use: Never used  Substance and Sexual Activity   Alcohol use: No    Alcohol/week: 0.0 standard drinks    Comment: socially    Drug use: No   Sexual activity: Not Currently     Partners: Male    Birth control/protection: None  Other Topics Concern   Not on file  Social History Narrative   Not on file   Social Determinants of Health   Financial Resource Strain: Not on file  Food Insecurity: Not on file  Transportation Needs: Not on file  Physical Activity: Not on file  Stress: Not on file  Social Connections: Not on file  Intimate Partner Violence: Not on file    Current Outpatient Medications on File Prior to Visit  Medication Sig Dispense Refill   acetaminophen (TYLENOL) 650 MG CR tablet Take 650 mg by mouth every 8 (eight) hours as needed for pain.     acyclovir (ZOVIRAX) 400 MG tablet Take 400 mg by mouth daily.     ALPRAZolam (XANAX) 0.5 MG tablet Take 0.25 mg by mouth at bedtime as needed for anxiety or sleep.     aspirin 81 MG EC tablet Take 1 tablet (81 mg total) by mouth daily. Swallow whole. 30 tablet  11   Biotin w/ Vitamins C & E (HAIR SKIN & NAILS GUMMIES PO) Take 2 capsules by mouth daily.     Cholecalciferol (VITAMIN D) 50 MCG (2000 UT) tablet Take 2,000 Units by mouth daily.     clotrimazole (LOTRIMIN) 1 % cream Apply 1 application topically 2 (two) times daily as needed (lip cracking).     clotrimazole-betamethasone (LOTRISONE) cream Apply 1 application topically 2 (two) times daily. 30 g 0   Cyanocobalamin (B-12 COMPLIANCE INJECTION IJ) Inject 1 Dose as directed every 30 (thirty) days.     Cyanocobalamin (B-12 PO) Take 1 tablet by mouth daily.     diclofenac Sodium (VOLTAREN) 1 % GEL Apply 1 application topically 4 (four) times daily as needed (pain).     ezetimibe (ZETIA) 10 MG tablet TAKE 1 TABLET ONCE DAILY. 90 tablet 3   furosemide (LASIX) 40 MG tablet Take 40 mg every THIRD day until your weight is back to baseline 30 tablet 0   levocetirizine (XYZAL) 5 MG tablet Take 5 mg by mouth daily.     Menthol-Methyl Salicylate (MUSCLE RUB EX) Apply 1 application topically daily as needed (muscle pain). [place] gel     metFORMIN (GLUCOPHAGE-XR)  500 MG 24 hr tablet Take 1 tablet (500 mg total) by mouth daily with breakfast. 90 tablet 3   Multiple Vitamins-Minerals (ADULT GUMMY PO) Take 2 capsules by mouth daily.     nitroGLYCERIN (NITROSTAT) 0.4 MG SL tablet Place 1 tablet (0.4 mg total) under the tongue every 5 (five) minutes as needed for chest pain. 25 tablet 3   oxybutynin (DITROPAN-XL) 10 MG 24 hr tablet Take 10 mg by mouth daily.     pantoprazole (PROTONIX) 40 MG tablet Take 40 mg by mouth daily.     potassium chloride (KLOR-CON) 10 MEQ tablet Take 10 meq every THIRD day until weight is back to baseline. Take with Lasix 30 tablet 0   PRESCRIPTION MEDICATION Apply 1 application topically 2 (two) times a week. Dexameth Na Ph 0.4%     rosuvastatin (CRESTOR) 40 MG tablet TAKE 1 TABLET BY MOUTH ONCE DAILY. 30 tablet 2   urea (CARMOL) 40 % CREA Apply 1 application topically daily. 120 g 2   metoprolol tartrate (LOPRESSOR) 25 MG tablet Take 0.5 tablets (12.5 mg total) by mouth 2 (two) times daily. 90 tablet 3   No current facility-administered medications on file prior to visit.      Family History  Problem Relation Age of Onset   Stroke Father    Diabetes Mother    Diabetes Sister    Colon cancer Neg Hx     BP 136/70 (BP Location: Left Arm, Patient Position: Sitting, Cuff Size: Normal)    Pulse 74    Ht '5\' 2"'$  (1.575 m)    Wt 149 lb (67.6 kg)    SpO2 99%    BMI 27.25 kg/m   Review of Systems She denies hypoglycemia/N/V.     Objective:   Physical Exam  Lab Results  Component Value Date   CREATININE 0.76 07/21/2021   BUN 15 07/21/2021   NA 141 07/21/2021   K 4.0 07/21/2021   CL 105 07/21/2021   CO2 28 07/21/2021    Lab Results  Component Value Date   HGBA1C 8.0 (A) 07/26/2021      Assessment & Plan:  Type 2 DM: uncontrolled.    Patient Instructions  check your blood sugar once a day.  vary the time of day when you check,  between before the 3 meals, and at bedtime.  also check if you have symptoms of your  blood sugar being too high or too low.  please keep a record of the readings and bring it to your next appointment here (or you can bring the meter itself).  You can write it on any piece of paper.  please call us sooner if your blood sugar goes below 70, or if most of your readings are over 200.   Our goals are to get the A1c below 7, and to phase our the glipizide.   We will need to take this complex situation in stages.   I have sent 2 prescriptions to your pharmacy: to double the Rybelsus, and to half the glipizide.  You can use up the 7 mg Rybelsus by taking 2 pills per day.  However, the glipizide cannot be cut.   Please continue the same metformin.   Please come back for a follow-up appointment in 1 month.

## 2021-08-23 NOTE — Patient Instructions (Addendum)
check your blood sugar once a day.  vary the time of day when you check, between before the 3 meals, and at bedtime.  also check if you have symptoms of your blood sugar being too high or too low.  please keep a record of the readings and bring it to your next appointment here (or you can bring the meter itself).  You can write it on any piece of paper.  please call us sooner if your blood sugar goes below 70, or if most of your readings are over 200.   ?Our goals are to get the A1c below 7, and to phase our the glipizide.   ?We will need to take this complex situation in stages.   ?I have sent 2 prescriptions to your pharmacy: to double the Rybelsus, and to half the glipizide.  ?You can use up the 7 mg Rybelsus by taking 2 pills per day.  However, the glipizide cannot be cut.   ?Please continue the same metformin.   ?Please come back for a follow-up appointment in 1 month.   ? ? ? ?

## 2021-08-24 ENCOUNTER — Ambulatory Visit: Payer: Medicare Other | Admitting: Endocrinology

## 2021-09-06 ENCOUNTER — Telehealth: Payer: Self-pay

## 2021-09-06 NOTE — Telephone Encounter (Signed)
Patient LVM stating that she has been experiencing a bit of nausea as a side effect of Rybelsus. Please advise ?

## 2021-09-06 NOTE — Telephone Encounter (Signed)
Patient has now been informed to reduce to 7 mg and will keep [place] updated. ?

## 2021-09-07 ENCOUNTER — Telehealth (INDEPENDENT_AMBULATORY_CARE_PROVIDER_SITE_OTHER): Payer: Self-pay

## 2021-09-07 NOTE — Telephone Encounter (Addendum)
09/12/2021: Patient called back today stating she can't keep going on like this she says she can't swallow half the time and she would like a call from Dr. Jenetta Downer at [phone]. Please advise.  ? ? ?Patient aware of all.  ?

## 2021-09-07 NOTE — Telephone Encounter (Signed)
Unfortunately, the management of her achalasia requires either surgical management or the endoscopic management for which she was referred to a tertiary center. She should follow up with them  - I discussed this referral with her multiple months ago to avoid the recurrence of symptoms, as botox efficacy tends to wear off with time and patients become less responsive to this medication the more times it is used. ?Unfortunately, medication changes will not make a difference ?

## 2021-09-07 NOTE — Telephone Encounter (Signed)
Patient called today stating she is still having issues with swallowing and pain with swallowing.She states she has a consult with [facility] April 14 that you referred her too. She does not know what to do in the mean time. She states she has been soaking cereal to get it soggy and that hurts to go down. She says she has a feeling of something stuck in her throat. She wanted to know if we can move her appointment up with the people in [place] or what can she do?  She also wanted to know if we changed her ppi from the pantoprazole to something else would that help? If so she would like sent to [facility] drug in [place]. Please advise.  ?

## 2021-09-13 DIAGNOSIS — H2513 Age-related nuclear cataract, bilateral: Secondary | ICD-10-CM | POA: Diagnosis not present

## 2021-09-13 DIAGNOSIS — H40033 Anatomical narrow angle, bilateral: Secondary | ICD-10-CM | POA: Diagnosis not present

## 2021-09-13 NOTE — Telephone Encounter (Signed)
I had a lengthy discussion with Katrina Castro regarding her symptoms.  She stated that she did not have any symptom relief after the most recent Botox injection.  I explained to her that the effect of this medication seems to wear off with each injection, which I had already explained to her and her daughter in the past.  I explained that we intended to refer her to [facility] or [facility] in July 2022 so she could undergo Heller myotomy or POEM but she declined this as she was "fearful of a bad outcome".  I also spoke to her daughter in the past about why we are referring her and the daughter stated that her mother was "a little stubborn".  I explained to Katrina Castro that the next step is for her to follow with [facility] regarding the POEM procedure.  If she is not able to tolerate any oral intake of liquids until then, we may consider referring her for PEG tube placement.  She understood and agreed. ?

## 2021-09-20 ENCOUNTER — Encounter: Payer: Self-pay | Admitting: Endocrinology

## 2021-09-20 ENCOUNTER — Ambulatory Visit (INDEPENDENT_AMBULATORY_CARE_PROVIDER_SITE_OTHER): Payer: Medicare Other | Admitting: Endocrinology

## 2021-09-20 VITALS — BP 120/70 | HR 83 | Ht 62.0 in | Wt 142.4 lb

## 2021-09-20 DIAGNOSIS — E119 Type 2 diabetes mellitus without complications: Secondary | ICD-10-CM

## 2021-09-20 LAB — POCT GLYCOSYLATED HEMOGLOBIN (HGB A1C): Hemoglobin A1C: 7.3 % — AB (ref 4.0–5.6)

## 2021-09-20 MED ORDER — DAPAGLIFLOZIN PROPANEDIOL 10 MG PO TABS: 10.0000 mg | ORAL_TABLET | Freq: Every day | ORAL | 3 refills | Status: DC

## 2021-09-20 NOTE — Patient Instructions (Addendum)
The Rybelsus is the most likely cause of the stomach symptoms, so you should stop it. ?check your blood sugar once a day.  vary the time of day when you check, between before the 3 meals, and at bedtime.  also check if you have symptoms of your blood sugar being too high or too low.  please keep a record of the readings and bring it to your next appointment here (or you can bring the meter itself).  You can write it on any piece of paper.  please call us sooner if your blood sugar goes below 70, or if most of your readings are over 200.   ?Our goals are to get the A1c below 7, and to phase our the glipizide if we can.   ?We will need to take this complex situation in stages.   ?Please continue the same metformin and glipizide for now.   ?I have sent a prescription to your pharmacy, to add [place].    ?Please come back for a follow-up appointment in 2-3 months.   ? ? ? ?

## 2021-09-20 NOTE — Progress Notes (Signed)
? ?Subjective:  ? ? Patient ID: Katrina Castro, female    DOB: 07-23-1945, 76 y.o.   MRN: [id number] ? ?HPI ?Pt returns for f/u of diabetes mellitus:  ?DM type: 2 ?Dx'ed: 2015 ?Complications: CAD and PN ?Therapy: 3 oral meds ?GDM: never ?DKA: never ?Severe hypoglycemia: never ?Pancreatitis: never ?Pancreatic imaging: large cyst on CT and MRI.  ?SDOH: none ?Other: she has never been on insulin.   ?Interval history: no cbg record, but states cbg's vary from 105-140.  She takes meds as rx'ed.  Nausea is worse recently.  She will see GI soon.  ?Past Medical History:  ?Diagnosis Date  ? Arthritis   ? CAD (coronary artery disease)   ? a. s/p CABG on 04/27/2020 with LIMA-LAD, seq SVG-PDA-PL, seq left radial-RI-OM  ? Concussion   ? 2015  ? Depression   ? Diabetes ([place])   ? type 2  ? Dysphagia   ? Dysrhythmia   ? afib  ? GERD (gastroesophageal reflux disease)   ? Headache   ? History of bronchitis   ? Hypertension   ? Seasonal allergies   ? Toenail fungus   ? ? ?Past Surgical History:  ?Procedure Laterality Date  ? APPENDECTOMY    ? BACK SURGERY    ? x2  ? BOTOX INJECTION N/A 06/06/2016  ? Procedure: BOTOX INJECTION;  Surgeon: Rogene Houston, MD;  [facility];  Service: Endoscopy;  Laterality: N/A;  ? BOTOX INJECTION N/A 01/17/2021  ? Procedure: BOTOX INJECTION;  Surgeon: Montez Morita, Quillian Quince, MD;  [facility];  Service: Gastroenterology;  Laterality: N/A;  ? BOTOX INJECTION N/A 07/11/2021  ? Procedure: BOTOX INJECTION;  Surgeon: Montez Morita, Quillian Quince, MD;  [facility];  Service: Gastroenterology;  Laterality: N/A;  ? CHOLECYSTECTOMY    ? COLONOSCOPY N/A 11/05/2012  ? Procedure: COLONOSCOPY;  Surgeon: Rogene Houston, MD;  [facility];  Service: Endoscopy;  Laterality: N/A;  1030  ? COLONOSCOPY WITH PROPOFOL N/A 05/01/2019  ? Procedure: COLONOSCOPY WITH PROPOFOL;  Surgeon: Rogene Houston, MD;  [facility];  Service: Endoscopy;  Laterality: N/A;  11:20am   ? CORONARY ARTERY BYPASS GRAFT N/A 04/27/2020  ? Procedure: CORONARY ARTERY BYPASS GRAFTING (CABG) TIMES FIVE USING LEFT INTERNAL MAMMARY ARTERY, LEFT HARVESTED RADIAL ARTERY, RIGHT GREATER SAPHENOUS VEIN HARVESTED ENDOSCOPICALLY.;  Surgeon: [name], MD;  Location: [place];  Service: Open Heart Surgery;  Laterality: N/A;  ? ESOPHAGEAL DILATION N/A 07/06/2015  ? Procedure: ESOPHAGEAL DILATION;  Surgeon: Rogene Houston, MD;  [facility];  Service: Endoscopy;  Laterality: N/A;  ? ESOPHAGEAL DILATION N/A 06/06/2016  ? Procedure: ESOPHAGEAL DILATION;  Surgeon: Rogene Houston, MD;  [facility];  Service: Endoscopy;  Laterality: N/A;  ? ESOPHAGOGASTRODUODENOSCOPY N/A 02/12/2014  ? Procedure: ESOPHAGOGASTRODUODENOSCOPY (EGD);  Surgeon: Rogene Houston, MD;  [facility];  Service: Endoscopy;  Laterality: N/A;  150  ? ESOPHAGOGASTRODUODENOSCOPY N/A 07/06/2015  ? Procedure: ESOPHAGOGASTRODUODENOSCOPY (EGD);  Surgeon: Rogene Houston, MD;  [facility];  Service: Endoscopy;  Laterality: N/A;  1:25 - moved to 1/18 @ 10:30 - Ann to notify pt  ? ESOPHAGOGASTRODUODENOSCOPY (EGD) WITH ESOPHAGEAL DILATION N/A 07/25/2012  ? Procedure: ESOPHAGOGASTRODUODENOSCOPY (EGD) WITH ESOPHAGEAL DILATION;  Surgeon: Rogene Houston, MD;  [facility];  Service: Endoscopy;  Laterality: N/A;  325-rescheduled to [place] notified pt  ? ESOPHAGOGASTRODUODENOSCOPY (EGD) WITH PROPOFOL N/A 06/06/2016  ? Procedure: ESOPHAGOGASTRODUODENOSCOPY (EGD)  WITH PROPOFOL;  Surgeon: Rogene Houston, MD;  [facility];  Service: Endoscopy;  Laterality: N/A;  ? ESOPHAGOGASTRODUODENOSCOPY (EGD) WITH PROPOFOL N/A 01/17/2021  ? Procedure: ESOPHAGOGASTRODUODENOSCOPY (EGD) WITH PROPOFOL;  Surgeon: Harvel Quale, MD;  [facility];  Service: Gastroenterology;  Laterality: N/A;  10:35  ? ESOPHAGOGASTRODUODENOSCOPY (EGD) WITH PROPOFOL N/A 07/11/2021  ? Procedure:  ESOPHAGOGASTRODUODENOSCOPY (EGD) WITH PROPOFOL;  Surgeon: Harvel Quale, MD;  [facility];  Service: Gastroenterology;  Laterality: N/A;  11:15  ? EYE SURGERY    ? cataracts removed  ? Foot surgeries Bilateral   ? hammer toes  ? LEFT HEART CATH AND CORONARY ANGIOGRAPHY N/A 04/26/2020  ? Procedure: LEFT HEART CATH AND CORONARY ANGIOGRAPHY;  Surgeon: Martinique, Peter M, MD;  Location: [place] [facility];  Service: Cardiovascular;  Laterality: N/A;  ? MALONEY DILATION N/A 02/12/2014  ? Procedure: MALONEY DILATION;  Surgeon: Rogene Houston, MD;  [facility];  Service: Endoscopy;  Laterality: N/A;  ? POLYPECTOMY  05/01/2019  ? Procedure: POLYPECTOMY;  Surgeon: Rogene Houston, MD;  [facility];  Service: Endoscopy;;  colon ?  ? RADIAL ARTERY HARVEST Left 04/27/2020  ? Procedure: LEFT RADIAL ARTERY HARVEST;  Surgeon: [name], MD;  Location: [place];  Service: Open Heart Surgery;  Laterality: Left;  ? Right knee arthroscopy    ? x2  ? TEE WITHOUT CARDIOVERSION N/A 04/27/2020  ? Procedure: TRANSESOPHAGEAL ECHOCARDIOGRAM (TEE);  Surgeon: [name], MD;  Location: [place];  Service: Open Heart Surgery;  Laterality: N/A;  ? TONSILLECTOMY    ? TOTAL ABDOMINAL HYSTERECTOMY    ? TOTAL KNEE ARTHROPLASTY Right 04/03/2017  ? Procedure: RIGHT TOTAL KNEE ARTHROPLASTY;  Surgeon: Latanya Maudlin, MD;  [facility];  Service: Orthopedics;  Laterality: Right;  ? ? ?Social History  ? ?Socioeconomic History  ? Marital status: Divorced  ?  Spouse name: Not on file  ? Number of children: Not on file  ? Years of education: Not on file  ? Highest education level: Not on file  ?Occupational History  ? Not on file  ?Tobacco Use  ? Smoking status: Never  ? Smokeless tobacco: Never  ?Vaping Use  ? Vaping Use: Never used  ?Substance and Sexual Activity  ? Alcohol use: No  ?  Alcohol/week: 0.0 standard drinks  ?  Comment: socially   ? Drug use: No  ? Sexual activity: Not Currently  ?   Partners: Male  ?  Birth control/protection: None  ?Other Topics Concern  ? Not on file  ?Social History Narrative  ? Not on file  ? ?Social Determinants of Health  ? ?Financial Resource Strain: Not on file  ?Food Insecurity: Not on file  ?Transportation Needs: Not on file  ?Physical Activity: Not on file  ?Stress: Not on file  ?Social Connections: Not on file  ?Intimate Partner Violence: Not on file  ? ? ?Current Outpatient Medications on File Prior to Visit  ?Medication Sig Dispense Refill  ? acetaminophen (TYLENOL) 650 MG CR tablet Take 650 mg by mouth every 8 (eight) hours as needed for pain.    ? acyclovir (ZOVIRAX) 400 MG tablet Take 400 mg by mouth daily.    ? ALPRAZolam (XANAX) 0.5 MG tablet Take 0.25 mg by mouth at bedtime as needed for anxiety or sleep.    ? aspirin 81 MG EC tablet Take 1 tablet (81 mg total) by mouth daily. Swallow whole. 30 tablet 11  ?  Biotin w/ Vitamins C & E (HAIR SKIN & NAILS GUMMIES PO) Take 2 capsules by mouth daily.    ? Cholecalciferol (VITAMIN D) 50 MCG (2000 UT) tablet Take 2,000 Units by mouth daily.    ? clotrimazole (LOTRIMIN) 1 % cream Apply 1 application topically 2 (two) times daily as needed (lip cracking).    ? clotrimazole-betamethasone (LOTRISONE) cream Apply 1 application topically 2 (two) times daily. 30 g 0  ? Cyanocobalamin (B-12 COMPLIANCE INJECTION IJ) Inject 1 Dose as directed every 30 (thirty) days.    ? Cyanocobalamin (B-12 PO) Take 1 tablet by mouth daily.    ? diclofenac Sodium (VOLTAREN) 1 % GEL Apply 1 application topically 4 (four) times daily as needed (pain).    ? ezetimibe (ZETIA) 10 MG tablet TAKE 1 TABLET ONCE DAILY. 90 tablet 3  ? furosemide (LASIX) 40 MG tablet Take 40 mg every THIRD day until your weight is back to baseline 30 tablet 0  ? glipiZIDE (GLUCOTROL XL) 2.5 MG 24 hr tablet Take 1 tablet (2.5 mg total) by mouth daily with breakfast. 90 tablet 3  ? levocetirizine (XYZAL) 5 MG tablet Take 5 mg by mouth daily.    ? Menthol-Methyl Salicylate  (MUSCLE RUB EX) Apply 1 application topically daily as needed (muscle pain). [place] gel    ? metFORMIN (GLUCOPHAGE-XR) 500 MG 24 hr tablet Take 1 tablet (500 mg total) by mouth daily with breakfast. 90 tablet 3  ? Multiple Vi

## 2021-09-25 DIAGNOSIS — E538 Deficiency of other specified B group vitamins: Secondary | ICD-10-CM | POA: Diagnosis not present

## 2021-09-29 DIAGNOSIS — K220 Achalasia of cardia: Secondary | ICD-10-CM | POA: Diagnosis not present

## 2021-10-05 DIAGNOSIS — K220 Achalasia of cardia: Secondary | ICD-10-CM | POA: Diagnosis not present

## 2021-10-11 ENCOUNTER — Other Ambulatory Visit: Payer: Self-pay | Admitting: Internal Medicine

## 2021-10-23 DIAGNOSIS — K219 Gastro-esophageal reflux disease without esophagitis: Secondary | ICD-10-CM | POA: Diagnosis not present

## 2021-10-23 DIAGNOSIS — K449 Diaphragmatic hernia without obstruction or gangrene: Secondary | ICD-10-CM | POA: Diagnosis not present

## 2021-10-23 DIAGNOSIS — R948 Abnormal results of function studies of other organs and systems: Secondary | ICD-10-CM | POA: Diagnosis not present

## 2021-10-23 DIAGNOSIS — R1314 Dysphagia, pharyngoesophageal phase: Secondary | ICD-10-CM | POA: Diagnosis not present

## 2021-10-25 ENCOUNTER — Encounter: Payer: Self-pay | Admitting: Internal Medicine

## 2021-10-25 ENCOUNTER — Ambulatory Visit (INDEPENDENT_AMBULATORY_CARE_PROVIDER_SITE_OTHER): Payer: Medicare Other | Admitting: Internal Medicine

## 2021-10-25 VITALS — BP 122/68 | HR 68 | Ht 62.5 in | Wt 141.2 lb

## 2021-10-25 DIAGNOSIS — I10 Essential (primary) hypertension: Secondary | ICD-10-CM | POA: Diagnosis not present

## 2021-10-25 NOTE — Patient Instructions (Signed)
Medication Instructions:  ?No changes ? ?Labwork: ?None ? ?Testing/Procedures: ?None ? ?Follow-Up: ?Follow up with Dr. Harrington Challenger in October 2023. ? ?Any Other Special Instructions Will Be Listed Below (If Applicable). ? ? ? ? ?If you need a refill on your cardiac medications before your next appointment, please call your pharmacy. ?

## 2021-10-25 NOTE — Progress Notes (Signed)
? ?Cardiology Office Note ? ? ?Date:  10/25/2021  ? ?ID:  Katrina Castro, DOB Jul 17, 1945, MRN [id number] ? ?PCP:  Glenda Chroman, MD  ?Cardiologist:   Dorris Carnes, MD  ? ? ?F/U of CAD  ?  ?History of Present Illness: ?Katrina Castro is a 76 y.o. female with a history of CAD (s/p CABG in Nov 2021 (LIMA to LAD; SVG to PDA, PLSA; L radial to RI, OM1), HTN, HL and Type II DM   Post CABG had post op afib, UTI, diarrhea.   The pt has also had chronic chest pains    ? ?I saw the pt in Feb 2023    ? ?The pt is currently being seen at [facility] (GI)   Had a procedure last week  (Manoometry)   Rough   Says she still can't taste anything    Going back next week for another procedure    ?  ? Does have some chest wall pain that is bothering her    At bottom of inscision there is a bump that is tender.     Breathing is stable    ? ?Current Meds  ?Medication Sig  ? acetaminophen (TYLENOL) 650 MG CR tablet Take 650 mg by mouth every 8 (eight) hours as needed for pain.  ? acyclovir (ZOVIRAX) 400 MG tablet Take 400 mg by mouth daily.  ? ALPRAZolam (XANAX) 0.5 MG tablet Take 0.25 mg by mouth at bedtime as needed for anxiety or sleep.  ? aspirin 81 MG EC tablet Take 1 tablet (81 mg total) by mouth daily. Swallow whole.  ? Biotin w/ Vitamins C & E (HAIR SKIN & NAILS GUMMIES PO) Take 2 capsules by mouth daily.  ? Cholecalciferol (VITAMIN D) 50 MCG (2000 UT) tablet Take 2,000 Units by mouth daily.  ? clotrimazole (LOTRIMIN) 1 % cream Apply 1 application topically 2 (two) times daily as needed (lip cracking).  ? clotrimazole-betamethasone (LOTRISONE) cream Apply 1 application topically 2 (two) times daily.  ? Cyanocobalamin (B-12 COMPLIANCE INJECTION IJ) Inject 1 Dose as directed every 30 (thirty) days.  ? Cyanocobalamin (B-12 PO) Take 1 tablet by mouth daily.  ? dapagliflozin propanediol (FARXIGA) 10 MG TABS tablet Take 1 tablet (10 mg total) by mouth daily before breakfast.  ? diclofenac Sodium (VOLTAREN) 1 % GEL Apply 1 application  topically 4 (four) times daily as needed (pain).  ? ezetimibe (ZETIA) 10 MG tablet TAKE 1 TABLET ONCE DAILY.  ? glipiZIDE (GLUCOTROL XL) 2.5 MG 24 hr tablet Take 1 tablet (2.5 mg total) by mouth daily with breakfast.  ? levocetirizine (XYZAL) 5 MG tablet Take 5 mg by mouth daily.  ? Menthol-Methyl Salicylate (MUSCLE RUB EX) Apply 1 application topically daily as needed (muscle pain). [place] gel  ? metFORMIN (GLUCOPHAGE-XR) 500 MG 24 hr tablet Take 1 tablet (500 mg total) by mouth daily with breakfast.  ? metoprolol tartrate (LOPRESSOR) 25 MG tablet Take 0.5 tablets (12.5 mg total) by mouth 2 (two) times daily.  ? Multiple Vitamins-Minerals (ADULT GUMMY PO) Take 2 capsules by mouth daily.  ? nitroGLYCERIN (NITROSTAT) 0.4 MG SL tablet Place 1 tablet (0.4 mg total) under the tongue every 5 (five) minutes as needed for chest pain.  ? oxybutynin (DITROPAN-XL) 10 MG 24 hr tablet Take 10 mg by mouth daily.  ? pantoprazole (PROTONIX) 40 MG tablet Take 40 mg by mouth daily.  ? PRESCRIPTION MEDICATION Apply 1 application topically 2 (two) times a week. Dexameth Na Ph 0.4%  ?  rosuvastatin (CRESTOR) 40 MG tablet TAKE 1 TABLET BY MOUTH ONCE DAILY.  ? urea (CARMOL) 40 % CREA Apply 1 application topically daily.  ? ? ? ?Allergies:   Levofloxacin, Cardizem [diltiazem], Sulfa antibiotics, Diazepam, and Lipitor [atorvastatin]  ? ?Past Medical History:  ?Diagnosis Date  ? Arthritis   ? CAD (coronary artery disease)   ? a. s/p CABG on 04/27/2020 with LIMA-LAD, seq SVG-PDA-PL, seq left radial-RI-OM  ? Concussion   ? 2015  ? Depression   ? Diabetes ([place])   ? type 2  ? Dysphagia   ? Dysrhythmia   ? afib  ? GERD (gastroesophageal reflux disease)   ? Headache   ? History of bronchitis   ? Hypertension   ? Seasonal allergies   ? Toenail fungus   ? ? ?Past Surgical History:  ?Procedure Laterality Date  ? APPENDECTOMY    ? BACK SURGERY    ? x2  ? BOTOX INJECTION N/A 06/06/2016  ? Procedure: BOTOX INJECTION;  Surgeon: Rogene Houston, MD;   [facility];  Service: Endoscopy;  Laterality: N/A;  ? BOTOX INJECTION N/A 01/17/2021  ? Procedure: BOTOX INJECTION;  Surgeon: Montez Morita, Quillian Quince, MD;  [facility];  Service: Gastroenterology;  Laterality: N/A;  ? BOTOX INJECTION N/A 07/11/2021  ? Procedure: BOTOX INJECTION;  Surgeon: Montez Morita, Quillian Quince, MD;  [facility];  Service: Gastroenterology;  Laterality: N/A;  ? CHOLECYSTECTOMY    ? COLONOSCOPY N/A 11/05/2012  ? Procedure: COLONOSCOPY;  Surgeon: Rogene Houston, MD;  [facility];  Service: Endoscopy;  Laterality: N/A;  1030  ? COLONOSCOPY WITH PROPOFOL N/A 05/01/2019  ? Procedure: COLONOSCOPY WITH PROPOFOL;  Surgeon: Rogene Houston, MD;  [facility];  Service: Endoscopy;  Laterality: N/A;  11:20am  ? CORONARY ARTERY BYPASS GRAFT N/A 04/27/2020  ? Procedure: CORONARY ARTERY BYPASS GRAFTING (CABG) TIMES FIVE USING LEFT INTERNAL MAMMARY ARTERY, LEFT HARVESTED RADIAL ARTERY, RIGHT GREATER SAPHENOUS VEIN HARVESTED ENDOSCOPICALLY.;  Surgeon: [name], MD;  Location: [place];  Service: Open Heart Surgery;  Laterality: N/A;  ? ESOPHAGEAL DILATION N/A 07/06/2015  ? Procedure: ESOPHAGEAL DILATION;  Surgeon: Rogene Houston, MD;  [facility];  Service: Endoscopy;  Laterality: N/A;  ? ESOPHAGEAL DILATION N/A 06/06/2016  ? Procedure: ESOPHAGEAL DILATION;  Surgeon: Rogene Houston, MD;  [facility];  Service: Endoscopy;  Laterality: N/A;  ? ESOPHAGOGASTRODUODENOSCOPY N/A 02/12/2014  ? Procedure: ESOPHAGOGASTRODUODENOSCOPY (EGD);  Surgeon: Rogene Houston, MD;  [facility];  Service: Endoscopy;  Laterality: N/A;  150  ? ESOPHAGOGASTRODUODENOSCOPY N/A 07/06/2015  ? Procedure: ESOPHAGOGASTRODUODENOSCOPY (EGD);  Surgeon: Rogene Houston, MD;  [facility];  Service: Endoscopy;  Laterality: N/A;  1:25 - moved to 1/18 @ 10:30 - Ann to notify pt  ? ESOPHAGOGASTRODUODENOSCOPY (EGD) WITH ESOPHAGEAL DILATION  N/A 07/25/2012  ? Procedure: ESOPHAGOGASTRODUODENOSCOPY (EGD) WITH ESOPHAGEAL DILATION;  Surgeon: Rogene Houston, MD;  [facility];  Service: Endoscopy;  Laterality: N/A;  325-rescheduled to [place] notified pt  ? ESOPHAGOGASTRODUODENOSCOPY (EGD) WITH PROPOFOL N/A 06/06/2016  ? Procedure: ESOPHAGOGASTRODUODENOSCOPY (EGD) WITH PROPOFOL;  Surgeon: Rogene Houston, MD;  [facility];  Service: Endoscopy;  Laterality: N/A;  ? ESOPHAGOGASTRODUODENOSCOPY (EGD) WITH PROPOFOL N/A 01/17/2021  ? Procedure: ESOPHAGOGASTRODUODENOSCOPY (EGD) WITH PROPOFOL;  Surgeon: Harvel Quale, MD;  [facility];  Service: Gastroenterology;  Laterality: N/A;  10:35  ? ESOPHAGOGASTRODUODENOSCOPY (EGD) WITH PROPOFOL N/A  07/11/2021  ? Procedure: ESOPHAGOGASTRODUODENOSCOPY (EGD) WITH PROPOFOL;  Surgeon: Harvel Quale, MD;  [facility];  Service: Gastroenterology;  Laterality: N/A;  11:15  ? EYE SURGERY    ? cataracts removed  ? Foot surgeries Bilateral   ? hammer toes  ? LEFT HEART CATH AND CORONARY ANGIOGRAPHY N/A 04/26/2020  ? Procedure: LEFT HEART CATH AND CORONARY ANGIOGRAPHY;  Surgeon: Martinique, Peter M, MD;  Location: [place] [facility];  Service: Cardiovascular;  Laterality: N/A;  ? MALONEY DILATION N/A 02/12/2014  ? Procedure: MALONEY DILATION;  Surgeon: Rogene Houston, MD;  [facility];  Service: Endoscopy;  Laterality: N/A;  ? POLYPECTOMY  05/01/2019  ? Procedure: POLYPECTOMY;  Surgeon: Rogene Houston, MD;  [facility];  Service: Endoscopy;;  colon ?  ? RADIAL ARTERY HARVEST Left 04/27/2020  ? Procedure: LEFT RADIAL ARTERY HARVEST;  Surgeon: [name], MD;  Location: [place];  Service: Open Heart Surgery;  Laterality: Left;  ? Right knee arthroscopy    ? x2  ? TEE WITHOUT CARDIOVERSION N/A 04/27/2020  ? Procedure: TRANSESOPHAGEAL ECHOCARDIOGRAM (TEE);  Surgeon: [name], MD;  Location: [place];  Service: Open Heart Surgery;  Laterality:  N/A;  ? TONSILLECTOMY    ? TOTAL ABDOMINAL HYSTERECTOMY    ? TOTAL KNEE ARTHROPLASTY Right 04/03/2017  ? Procedure: RIGHT TOTAL KNEE ARTHROPLASTY;  Surgeon: Latanya Maudlin, MD;  [facility];  Service: Sophronia Simas

## 2021-10-30 ENCOUNTER — Telehealth (INDEPENDENT_AMBULATORY_CARE_PROVIDER_SITE_OTHER): Payer: Self-pay

## 2021-10-30 NOTE — Telephone Encounter (Signed)
I called left a message on vm asked that the patient please return call.  ? ?Patient left a message on vm this am reporting that "they" will not be preforming the procedure on her.  ?

## 2021-10-30 NOTE — Telephone Encounter (Signed)
I spoke with the patient and he states she had a test (Esophageal Motility) done 10/23/2021, and preformed by Dr.Cotton at [facility] and he states that he did not recommend for Dr.Graham to do procedure on her as if they did it would only make things worse,that she could end up with a feeding tube and she is not willing to do that. She would like you to look at the notes and contact either Dr. Filbert Schilder or Dr. Phillip Heal to see what could be done for her.  ?

## 2021-10-31 DIAGNOSIS — E538 Deficiency of other specified B group vitamins: Secondary | ICD-10-CM | POA: Diagnosis not present

## 2021-11-06 ENCOUNTER — Telehealth: Payer: Self-pay | Admitting: Internal Medicine

## 2021-11-06 MED ORDER — NITROGLYCERIN 0.4 MG SL SUBL
0.4000 mg | SUBLINGUAL_TABLET | SUBLINGUAL | 3 refills | Status: AC | PRN
Start: 2021-11-06 — End: ?

## 2021-11-06 NOTE — Telephone Encounter (Signed)
Patient called in with chest pain.  Patient describes pain as sharp, tightness in chest wall. She reports this began on Saturday 5/20 when she was line dancing at a wedding. She states it is relieved with rest. Patient reports she is out of nitroglycerin, refill sent to [place].  Patient also reports SOB with activity and chest pain/tightness worse with deep breaths.   Advised patient on ED precautions, and the option to go to Urgent Care for further evaluation. Also encouraged patient to increase fluid intake for adequate hydration and rest as this sounds like it could be musculoskeletal related vs cardiac. Instructed patient on nitroglycerin use and ED precautions.  Patient verbalized understanding and expressed appreciation for call.

## 2021-11-06 NOTE — Telephone Encounter (Signed)
Pt c/o of Chest Pain: STAT if CP now or developed within 24 hours  1. Are you having CP right now? Chest is sore  2. Are you experiencing any other symptoms (ex. SOB, nausea, vomiting, sweating)? SOB  3. How long have you been experiencing CP? This morning  4. Is your CP continuous or coming and going? Coming and going  5. Have you taken Nitroglycerin? No  ?

## 2021-11-08 NOTE — Telephone Encounter (Signed)
I reviewed the patient's chart and recent investigations performed at [facility] which did not show any changes highly consistent with achalasia.  This is confusing as patient had changes of achalasia at the manometry performed at [facility] many years ago but on her most recent manometry she only had a couple of episodes of pan esophageal pressurization but this eventually cleared up.  It is possible that Botox has helped decreasing the dysmotility but is unclear if she indeed had a diagnosis of achalasia.  Due to this, she was not considered a candidate for POEM. she was prescribed Levsin by Dr. Filbert Schilder to improve her symptoms.  She has felt some mild improvement with it.  I advised her to follow-up with Dr. Greig Castilla and if her symptoms persist we can consider performing an empiric dilation.

## 2021-11-09 ENCOUNTER — Ambulatory Visit: Payer: Medicare Other | Admitting: Podiatry

## 2021-11-09 DIAGNOSIS — E1165 Type 2 diabetes mellitus with hyperglycemia: Secondary | ICD-10-CM | POA: Diagnosis not present

## 2021-11-09 DIAGNOSIS — I4891 Unspecified atrial fibrillation: Secondary | ICD-10-CM | POA: Diagnosis not present

## 2021-11-09 DIAGNOSIS — Z299 Encounter for prophylactic measures, unspecified: Secondary | ICD-10-CM | POA: Diagnosis not present

## 2021-11-09 DIAGNOSIS — R7309 Other abnormal glucose: Secondary | ICD-10-CM | POA: Diagnosis not present

## 2021-11-09 DIAGNOSIS — I10 Essential (primary) hypertension: Secondary | ICD-10-CM | POA: Diagnosis not present

## 2021-11-15 DIAGNOSIS — G5793 Unspecified mononeuropathy of bilateral lower limbs: Secondary | ICD-10-CM | POA: Diagnosis not present

## 2021-11-15 DIAGNOSIS — E1165 Type 2 diabetes mellitus with hyperglycemia: Secondary | ICD-10-CM | POA: Diagnosis not present

## 2021-11-15 DIAGNOSIS — I10 Essential (primary) hypertension: Secondary | ICD-10-CM | POA: Diagnosis not present

## 2021-11-17 ENCOUNTER — Ambulatory Visit (INDEPENDENT_AMBULATORY_CARE_PROVIDER_SITE_OTHER): Payer: Medicare Other | Admitting: Internal Medicine

## 2021-11-17 ENCOUNTER — Encounter: Payer: Self-pay | Admitting: Internal Medicine

## 2021-11-17 VITALS — BP 120/70 | HR 72 | Ht 62.5 in | Wt 139.0 lb

## 2021-11-17 DIAGNOSIS — E1159 Type 2 diabetes mellitus with other circulatory complications: Secondary | ICD-10-CM

## 2021-11-17 DIAGNOSIS — E1165 Type 2 diabetes mellitus with hyperglycemia: Secondary | ICD-10-CM

## 2021-11-17 DIAGNOSIS — E785 Hyperlipidemia, unspecified: Secondary | ICD-10-CM

## 2021-11-17 LAB — POCT GLYCOSYLATED HEMOGLOBIN (HGB A1C): Hemoglobin A1C: 7 % — AB (ref 4.0–5.6)

## 2021-11-17 NOTE — Progress Notes (Signed)
Patient ID: Katrina Castro, female   DOB: 1946/02/12, 76 y.o.   MRN: [id number]  HPI: Katrina Castro is a 76 y.o.-year-old female, returning for follow-up for DM2, dx in 2015, non-insulin-dependent, uncontrolled, with complications (CAD, s/p CABG; PN). Pt. previously saw Dr. Loanne Drilling, last visit 2 months ago.  Reviewed HbA1c: Lab Results  Component Value Date   HGBA1C 7.3 (A) 09/20/2021   HGBA1C 8.0 (A) 07/26/2021   HGBA1C 6.4 (H) 05/12/2020   HGBA1C 7.5 (H) 04/25/2020   HGBA1C 7.5 (H) 04/25/2020   Pt is on a regimen of: - Metformin ER 500 mg 2x daily - Glipizide ER 2.5 mg before breakfast - Farxiga 10 mg before breakfast -insulin 55$ per mo, now 143$ per mo She tried Rybelsus >> nausea, but retrospectively, this was 2/2 Esophageal pbs.  Pt checks her sugars 0-1x a day and they are: - am: 62, 108-114, 160 (watermelon) - 2h after b'fast: n/c - before lunch: n/c - 2h after lunch: n/c - before dinner: n/c - 2h after dinner: n/c - bedtime: n/c - nighttime: n/c Lowest sugar was 62 (took Glipizide at bedtime as she forgot it in am); she has hypoglycemia awareness at 70.  Highest sugar was 160.  Glucometer:Prodigy  - no CKD, last BUN/creatinine:  Lab Results  Component Value Date   BUN 15 07/21/2021   BUN 12 07/06/2021   CREATININE 0.76 07/21/2021   CREATININE 0.78 07/06/2021  She is not on ACE inhibitor or ARB.  -+ HL; last set of lipids: Lab Results  Component Value Date   CHOL 110 07/21/2021   HDL 38 (L) 07/21/2021   LDLCALC 39 07/21/2021   TRIG 167 (H) 07/21/2021   CHOLHDL 2.9 07/21/2021  On Crestor 40, Zetia 10.  - last eye exam was in 09/2021. No DR - reportedly.   - no numbness and tingling in her feet.  She has a history of left foot surgery.  She also has a history of atrial fibrillation, HTN, GERD, depression. She is on B12 supplements.  She goes to the gym - treadmill.  She was abandoned by her mother in a hotel when she was 46 weeks old. She is  working part time - caregiver for cancer patient.  She used to drive a truck for many years.  ROS: + see HPI + increased urination, no blurry vision, no nausea, no chest pain.  Past Medical History:  Diagnosis Date   Arthritis    CAD (coronary artery disease)    a. s/p CABG on 04/27/2020 with LIMA-LAD, seq SVG-PDA-PL, seq left radial-RI-OM   Concussion    2015   Depression    Diabetes ([place])    type 2   Dysphagia    Dysrhythmia    afib   GERD (gastroesophageal reflux disease)    Headache    History of bronchitis    Hypertension    Seasonal allergies    Toenail fungus    Past Surgical History:  Procedure Laterality Date   APPENDECTOMY     BACK SURGERY     x2   BOTOX INJECTION N/A 06/06/2016   Procedure: BOTOX INJECTION;  Surgeon: Rogene Houston, MD;  [facility];  Service: Endoscopy;  Laterality: N/A;   BOTOX INJECTION N/A 01/17/2021   Procedure: BOTOX INJECTION;  Surgeon: Harvel Quale, MD;  [facility];  Service: Gastroenterology;  Laterality: N/A;   BOTOX INJECTION N/A 07/11/2021   Procedure: BOTOX INJECTION;  Surgeon: Harvel Quale, MD;  [facility];  Service: Gastroenterology;  Laterality: N/A;   CHOLECYSTECTOMY     COLONOSCOPY N/A 11/05/2012   Procedure: COLONOSCOPY;  Surgeon: Rogene Houston, MD;  [facility];  Service: Endoscopy;  Laterality: N/A;  1030   COLONOSCOPY WITH PROPOFOL N/A 05/01/2019   Procedure: COLONOSCOPY WITH PROPOFOL;  Surgeon: Rogene Houston, MD;  [facility];  Service: Endoscopy;  Laterality: N/A;  11:20am   CORONARY ARTERY BYPASS GRAFT N/A 04/27/2020   Procedure: CORONARY ARTERY BYPASS GRAFTING (CABG) TIMES FIVE USING LEFT INTERNAL MAMMARY ARTERY, LEFT HARVESTED RADIAL ARTERY, RIGHT GREATER SAPHENOUS VEIN HARVESTED ENDOSCOPICALLY.;  Surgeon: [name], MD;  Location: [facility];  Service: Open Heart Surgery;  Laterality: N/A;   ESOPHAGEAL DILATION N/A 07/06/2015    Procedure: ESOPHAGEAL DILATION;  Surgeon: Rogene Houston, MD;  [facility];  Service: Endoscopy;  Laterality: N/A;   ESOPHAGEAL DILATION N/A 06/06/2016   Procedure: ESOPHAGEAL DILATION;  Surgeon: Rogene Houston, MD;  [facility];  Service: Endoscopy;  Laterality: N/A;   ESOPHAGOGASTRODUODENOSCOPY N/A 02/12/2014   Procedure: ESOPHAGOGASTRODUODENOSCOPY (EGD);  Surgeon: Rogene Houston, MD;  [facility];  Service: Endoscopy;  Laterality: N/A;  150   ESOPHAGOGASTRODUODENOSCOPY N/A 07/06/2015   Procedure: ESOPHAGOGASTRODUODENOSCOPY (EGD);  Surgeon: Rogene Houston, MD;  [facility];  Service: Endoscopy;  Laterality: N/A;  1:25 - moved to 1/18 @ 10:30 - Ann to notify pt   ESOPHAGOGASTRODUODENOSCOPY (EGD) WITH ESOPHAGEAL DILATION N/A 07/25/2012   Procedure: ESOPHAGOGASTRODUODENOSCOPY (EGD) WITH ESOPHAGEAL DILATION;  Surgeon: Rogene Houston, MD;  [facility];  Service: Endoscopy;  Laterality: N/A;  325-rescheduled to [place] notified pt   ESOPHAGOGASTRODUODENOSCOPY (EGD) WITH PROPOFOL N/A 06/06/2016   Procedure: ESOPHAGOGASTRODUODENOSCOPY (EGD) WITH PROPOFOL;  Surgeon: Rogene Houston, MD;  [facility];  Service: Endoscopy;  Laterality: N/A;   ESOPHAGOGASTRODUODENOSCOPY (EGD) WITH PROPOFOL N/A 01/17/2021   Procedure: ESOPHAGOGASTRODUODENOSCOPY (EGD) WITH PROPOFOL;  Surgeon: Harvel Quale, MD;  [facility];  Service: Gastroenterology;  Laterality: N/A;  10:35   ESOPHAGOGASTRODUODENOSCOPY (EGD) WITH PROPOFOL N/A 07/11/2021   Procedure: ESOPHAGOGASTRODUODENOSCOPY (EGD) WITH PROPOFOL;  Surgeon: Harvel Quale, MD;  [facility];  Service: Gastroenterology;  Laterality: N/A;  11:15   EYE SURGERY     cataracts removed   Foot surgeries Bilateral    hammer toes   LEFT HEART CATH AND CORONARY ANGIOGRAPHY N/A 04/26/2020   Procedure: LEFT HEART CATH AND CORONARY ANGIOGRAPHY;  Surgeon: Martinique, Peter M,  MD;  Location: [facility];  Service: Cardiovascular;  Laterality: N/A;   MALONEY DILATION N/A 02/12/2014   Procedure: Venia Minks DILATION;  Surgeon: Rogene Houston, MD;  [facility];  Service: Endoscopy;  Laterality: N/A;   POLYPECTOMY  05/01/2019   Procedure: POLYPECTOMY;  Surgeon: Rogene Houston, MD;  [facility];  Service: Endoscopy;;  colon    RADIAL ARTERY HARVEST Left 04/27/2020   Procedure: LEFT RADIAL ARTERY HARVEST;  Surgeon: [name], MD;  Location: [facility];  Service: Open Heart Surgery;  Laterality: Left;   Right knee arthroscopy     x2   TEE WITHOUT CARDIOVERSION N/A 04/27/2020   Procedure: TRANSESOPHAGEAL ECHOCARDIOGRAM (TEE);  Surgeon: [name], MD;  Location: [facility];  Service: Open Heart Surgery;  Laterality: N/A;   TONSILLECTOMY     TOTAL ABDOMINAL HYSTERECTOMY     TOTAL KNEE ARTHROPLASTY Right 04/03/2017   Procedure:  RIGHT TOTAL KNEE ARTHROPLASTY;  Surgeon: Latanya Maudlin, MD;  Location: [facility];  Service: Orthopedics;  Laterality: Right;   Social History   Socioeconomic History   Marital status: Divorced    Spouse name: Not on file   Number of children: Not on file   Years of education: Not on file   Highest education level: Not on file  Occupational History   Not on file  Tobacco Use   Smoking status: Never   Smokeless tobacco: Never  Vaping Use   Vaping Use: Never used  Substance and Sexual Activity   Alcohol use: No    Alcohol/week: 0.0 standard drinks    Comment: socially    Drug use: No   Sexual activity: Not Currently    Partners: Male    Birth control/protection: None  Other Topics Concern   Not on file  Social History Narrative   Not on file   Social Determinants of Health   Financial Resource Strain: Not on file  Food Insecurity: Not on file  Transportation Needs: Not on file  Physical Activity: Not on file  Stress: Not on file  Social Connections: Not on file  Intimate Partner Violence: Not  on file   Current Outpatient Medications on File Prior to Visit  Medication Sig Dispense Refill   acetaminophen (TYLENOL) 650 MG CR tablet Take 650 mg by mouth every 8 (eight) hours as needed for pain.     acyclovir (ZOVIRAX) 400 MG tablet Take 400 mg by mouth daily.     ALPRAZolam (XANAX) 0.5 MG tablet Take 0.25 mg by mouth at bedtime as needed for anxiety or sleep.     aspirin 81 MG EC tablet Take 1 tablet (81 mg total) by mouth daily. Swallow whole. 30 tablet 11   Biotin w/ Vitamins C & E (HAIR SKIN & NAILS GUMMIES PO) Take 2 capsules by mouth daily.     Cholecalciferol (VITAMIN D) 50 MCG (2000 UT) tablet Take 2,000 Units by mouth daily.     clotrimazole (LOTRIMIN) 1 % cream Apply 1 application topically 2 (two) times daily as needed (lip cracking).     clotrimazole-betamethasone (LOTRISONE) cream Apply 1 application topically 2 (two) times daily. 30 g 0   Cyanocobalamin (B-12 COMPLIANCE INJECTION IJ) Inject 1 Dose as directed every 30 (thirty) days.     Cyanocobalamin (B-12 PO) Take 1 tablet by mouth daily.     dapagliflozin propanediol (FARXIGA) 10 MG TABS tablet Take 1 tablet (10 mg total) by mouth daily before breakfast. 90 tablet 3   diclofenac Sodium (VOLTAREN) 1 % GEL Apply 1 application topically 4 (four) times daily as needed (pain).     ezetimibe (ZETIA) 10 MG tablet TAKE 1 TABLET ONCE DAILY. 90 tablet 3   furosemide (LASIX) 40 MG tablet Take 40 mg every THIRD day until your weight is back to baseline (Patient not taking: Reported on 10/25/2021) 30 tablet 0   glipiZIDE (GLUCOTROL XL) 2.5 MG 24 hr tablet Take 1 tablet (2.5 mg total) by mouth daily with breakfast. 90 tablet 3   levocetirizine (XYZAL) 5 MG tablet Take 5 mg by mouth daily.     Menthol-Methyl Salicylate (MUSCLE RUB EX) Apply 1 application topically daily as needed (muscle pain). [place] gel     metFORMIN (GLUCOPHAGE-XR) 500 MG 24 hr tablet Take 1 tablet (500 mg total) by mouth daily with breakfast. 90 tablet 3   metoprolol  tartrate (LOPRESSOR) 25 MG tablet Take 0.5 tablets (12.5 mg total) by mouth 2 (  two) times daily. 90 tablet 3   Multiple Vitamins-Minerals (ADULT GUMMY PO) Take 2 capsules by mouth daily.     nitroGLYCERIN (NITROSTAT) 0.4 MG SL tablet Place 1 tablet (0.4 mg total) under the tongue every 5 (five) minutes as needed for chest pain. 25 tablet 3   oxybutynin (DITROPAN-XL) 10 MG 24 hr tablet Take 10 mg by mouth daily.     pantoprazole (PROTONIX) 40 MG tablet Take 40 mg by mouth daily.     potassium chloride (KLOR-CON) 10 MEQ tablet Take 10 meq every THIRD day until weight is back to baseline. Take with Lasix (Patient not taking: Reported on 10/25/2021) 30 tablet 0   PRESCRIPTION MEDICATION Apply 1 application topically 2 (two) times a week. Dexameth Na Ph 0.4%     rosuvastatin (CRESTOR) 40 MG tablet TAKE 1 TABLET BY MOUTH ONCE DAILY. 90 tablet 3   urea (CARMOL) 40 % CREA Apply 1 application topically daily. 120 g 2   No current facility-administered medications on file prior to visit.   Allergies  Allergen Reactions   Levofloxacin Anaphylaxis and Rash   Cardizem [Diltiazem] Other (See Comments)    Patient states that she could not think, felt that she was in the twilight zone. Arm discomfort.   Sulfa Antibiotics Itching   Diazepam Nausea Only   Lipitor [Atorvastatin] Other (See Comments)    Caused her body to be "out of whack"  - elevated sugar also.   Family History  Problem Relation Age of Onset   Stroke Father    Diabetes Mother    Diabetes Sister    Colon cancer Neg Hx    PE: BP 120/70 (BP Location: Right Arm, Patient Position: Sitting, Cuff Size: Normal)   Pulse 72   Ht 5' 2.5" (1.588 m)   Wt 139 lb (63 kg)   SpO2 99%   BMI 25.02 kg/m  Wt Readings from Last 3 Encounters:  11/17/21 139 lb (63 kg)  10/25/21 141 lb 3.2 oz (64 kg)  09/20/21 142 lb 6.4 oz (64.6 kg)   Constitutional: normal weight, in NAD Eyes: PERRLA, EOMI, no exophthalmos ENT: moist mucous membranes, no  thyromegaly, no cervical lymphadenopathy Cardiovascular: RRR, No MRG Respiratory: CTA B Musculoskeletal: no deformities Skin: moist, warm, no rashes Neurological: no tremor with outstretched hands Diabetic Foot Exam - Simple   Simple Foot Form Diabetic Foot exam was performed with the following findings: Yes 11/17/2021 11:29 AM  Visual Inspection See comments: Yes Sensation Testing See comments: Yes Pulse Check See comments: Yes Comments L deformed foot - s/p surgery. Decreased sensation to monofilament in L foot - lateral side, R foot  - medial side. Decreased pulse in L foot.      ASSESSMENT: 1. DM2, non-insulin-dependent, uncontrolled, with complications - CAD, s/p CABG 2021 - cardiologist: Dr. Harrington Challenger - PN  2. HL  PLAN:  1. Patient with long-standing, uncontrolled diabetes, on oral antidiabetic regimen, with suboptimal control.  HbA1c obtained 2 months ago was 7.3%.  Today, HbA1c is better, at 7.0%. - she continues on a regimen of metformin, SGLT2 inhibitor and sulfonylurea.  At today's visit, we discussed that this regimen appears to be working well for her.  She is only checking her blood sugars in the morning and they are usually at goal.  She had a low blood sugar in the 60s but this happened after she took glipizide at that time as she forgot to take it morning.  We discussed about not taking it at bedtime even  if she forgetting to take it in the morning going forward, but at next visit, we may need to stop her sulfonylurea.  She tolerates metformin well and we will continue this.  Wilder Glade is expensive for her and we gave her paperwork to see if she can obtain it from the patient assistance program. -She continues to stay active, exercising at the gym, walking her dog twice a day, and working part-time.  She has plenty of energy. - I suggested to:  Patient Instructions  Please use the following regimen: - Metformin ER 500 mg 2x a day - Glipizide ER 2.5 mg before breakfast -  Farxiga 10 mg before breakfast  Please return in 4 months with your sugar log.   - check sugars at different times of the day - check 1x a day, rotating checks - discussed about CBG targets for treatment: 80-130 mg/dL before meals and <180 mg/dL after meals; target HbA1c <7%. - advised for yearly eye exams  - Return to clinic in 4 mo with sugar log   2. HL - Reviewed latest lipid panel from 4 months ago: LDL at goal, check/slightly high, HDL slightly low Lab Results  Component Value Date   CHOL 110 07/21/2021   HDL 38 (L) 07/21/2021   LDLCALC 39 07/21/2021   TRIG 167 (H) 07/21/2021   CHOLHDL 2.9 07/21/2021  - Continues Crestor 40 mg daily, Zetia 10 mg daily without side effects.  Philemon Kingdom, MD PhD [facility]

## 2021-11-17 NOTE — Patient Instructions (Addendum)
Please use the following regimen: - Metformin ER 500 mg 2x a day - Glipizide ER 2.5 mg before breakfast - Farxiga 10 mg before breakfast  Please return in 4 months with your sugar log.   PATIENT INSTRUCTIONS FOR TYPE 2 DIABETES:  DIET AND EXERCISE Diet and exercise is an important part of diabetic treatment.  We recommended aerobic exercise in the form of brisk walking (working between 40-60% of maximal aerobic capacity, similar to brisk walking) for 150 minutes per week (such as 30 minutes five days per week) along with 3 times per week performing 'resistance' training (using various gauge rubber tubes with handles) 5-10 exercises involving the major muscle groups (upper body, lower body and core) performing 10-15 repetitions (or near fatigue) each exercise. Start at half the above goal but build slowly to reach the above goals. If limited by weight, joint pain, or disability, we recommend daily walking in a swimming pool with water up to waist to reduce pressure from joints while allow for adequate exercise.    BLOOD GLUCOSES Monitoring your blood glucoses is important for continued management of your diabetes. Please check your blood glucoses 2-4 times a day: fasting, before meals and at bedtime (you can rotate these measurements - e.g. one day check before the 3 meals, the next day check before 2 of the meals and before bedtime, etc.).   HYPOGLYCEMIA (low blood sugar) Hypoglycemia is usually a reaction to not eating, exercising, or taking too much insulin/ other diabetes drugs.  Symptoms include tremors, sweating, hunger, confusion, headache, etc. Treat IMMEDIATELY with 15 grams of Carbs: 4 glucose tablets  cup regular juice/soda 2 tablespoons raisins 4 teaspoons sugar 1 tablespoon honey Recheck blood glucose in 15 mins and repeat above if still symptomatic/blood glucose <100.  RECOMMENDATIONS TO REDUCE YOUR RISK OF DIABETIC COMPLICATIONS: * Take your prescribed MEDICATION(S) *  Follow a DIABETIC diet: Complex carbs, fiber rich foods, (monounsaturated and polyunsaturated) fats * AVOID saturated/trans fats, high fat foods, >2,300 mg salt per day. * EXERCISE at least 5 times a week for 30 minutes or preferably daily.  * DO NOT SMOKE OR DRINK more than 1 drink a day. * Check your FEET every day. Do not wear tightfitting shoes. Contact us if you develop an ulcer * See your EYE doctor once a year or more if needed * Get a FLU shot once a year * Get a PNEUMONIA vaccine once before and once after age 37 years  GOALS:  * Your Hemoglobin A1c of <7%  * fasting sugars need to be <130 * after meals sugars need to be <180 (2h after you start eating) * Your Systolic BP should be 468 or lower  * Your Diastolic BP should be 80 or lower  * Your HDL (Good Cholesterol) should be 40 or higher  * Your LDL (Bad Cholesterol) should be 100 or lower. * Your Triglycerides should be 150 or lower  * Your Urine microalbumin (kidney function) should be <30 * Your Body Mass Index should be 25 or lower

## 2021-11-22 ENCOUNTER — Telehealth: Payer: Self-pay | Admitting: *Deleted

## 2021-11-22 ENCOUNTER — Other Ambulatory Visit: Payer: Self-pay | Admitting: *Deleted

## 2021-11-22 ENCOUNTER — Other Ambulatory Visit (INDEPENDENT_AMBULATORY_CARE_PROVIDER_SITE_OTHER): Payer: Self-pay | Admitting: Gastroenterology

## 2021-11-22 DIAGNOSIS — R140 Abdominal distension (gaseous): Secondary | ICD-10-CM

## 2021-11-22 DIAGNOSIS — R197 Diarrhea, unspecified: Secondary | ICD-10-CM

## 2021-11-22 MED ORDER — DICYCLOMINE HCL 10 MG PO CAPS: 10.0000 mg | ORAL_CAPSULE | Freq: Two times a day (BID) | ORAL | 0 refills | Status: DC | PRN

## 2021-11-22 NOTE — Telephone Encounter (Signed)
Called and discussed with patient and she verbalized understanding. Orders put in at lab if patients diarrhea starts back. She has not had any since calling this morning.

## 2021-11-22 NOTE — Telephone Encounter (Signed)
Patient called and states she has been having gas that smells awful for past 3 weeks. Was taking gas x  - 2 pills a day and did not stop gas, states last night she started having diarrhea constantly for 4 hours. Diarrhea looked like red mud. She ate 1 stoft tacos and salsa and 2 bites of shredded chicken yesterday. States she thinks blood was wiping so much. It was bright red. No dizziness, no sob. She took imodium and it did not help and then took 2nd imodium and has not had any more diarrhea. She ate a bowel of cereal this morning and felt fine afterwards. Feels ok now. Asking for something for gas.   Katrina Castro   [phone]

## 2021-11-22 NOTE — Telephone Encounter (Signed)
I sent some Bentyl she can take as needed if significant abdominal pain or distention. Since she is having acute diarrhea, we can check her stool for C. Diff and gastrointestinal PCR for infections if she is still having symptoms. If so, please send her an order for these tests.

## 2021-11-27 DIAGNOSIS — I25119 Atherosclerotic heart disease of native coronary artery with unspecified angina pectoris: Secondary | ICD-10-CM | POA: Diagnosis not present

## 2021-11-27 DIAGNOSIS — Z299 Encounter for prophylactic measures, unspecified: Secondary | ICD-10-CM | POA: Diagnosis not present

## 2021-11-27 DIAGNOSIS — J40 Bronchitis, not specified as acute or chronic: Secondary | ICD-10-CM | POA: Diagnosis not present

## 2021-11-27 DIAGNOSIS — I10 Essential (primary) hypertension: Secondary | ICD-10-CM | POA: Diagnosis not present

## 2021-11-27 DIAGNOSIS — I4891 Unspecified atrial fibrillation: Secondary | ICD-10-CM | POA: Diagnosis not present

## 2021-12-04 ENCOUNTER — Ambulatory Visit: Payer: Medicare Other | Admitting: Podiatry

## 2021-12-06 DIAGNOSIS — E538 Deficiency of other specified B group vitamins: Secondary | ICD-10-CM | POA: Diagnosis not present

## 2021-12-11 ENCOUNTER — Telehealth: Payer: Self-pay

## 2021-12-14 ENCOUNTER — Ambulatory Visit: Payer: Medicare Other | Admitting: Podiatry

## 2021-12-26 ENCOUNTER — Telehealth (INDEPENDENT_AMBULATORY_CARE_PROVIDER_SITE_OTHER): Payer: Self-pay

## 2021-12-26 NOTE — Telephone Encounter (Signed)
Patient called today with complaints of a lot of gassiness. She says she takes gas x numerous times per day and a probiotic that she just recently started. Would like recommendation on what she can take. Please advise.

## 2021-12-27 NOTE — Telephone Encounter (Signed)
She can increase her dose of Bentyl to 3 times per day but also will benefit from a low FODMAP diet (please send her a copy).  Also asked her to make a follow-up appointment to evaluate her symptoms further in the office. As she had a cystic lesion in her pancreas, she is due for repeat MRI to evaluate if her pancreatic lesion is related to her bloating. Darius Bump, can you please schedule a MRCP? Dx: pancreatic cyst.  Thanks,  Maylon Peppers, MD [facility]astroenterology and Hepat[facility]

## 2021-12-28 ENCOUNTER — Other Ambulatory Visit (INDEPENDENT_AMBULATORY_CARE_PROVIDER_SITE_OTHER): Payer: Self-pay

## 2021-12-28 DIAGNOSIS — K862 Cyst of pancreas: Secondary | ICD-10-CM

## 2021-12-28 DIAGNOSIS — R140 Abdominal distension (gaseous): Secondary | ICD-10-CM

## 2021-12-28 MED ORDER — DICYCLOMINE HCL 10 MG PO CAPS: 10.0000 mg | ORAL_CAPSULE | Freq: Two times a day (BID) | ORAL | 0 refills | Status: DC | PRN

## 2021-12-28 NOTE — Telephone Encounter (Signed)
Patient aware of all. I have mailed a FODMAP diet to her and she asked that we send in a refill of Dicyclomine to [name], pharmacy which has been done.

## 2021-12-28 NOTE — Telephone Encounter (Signed)
Thanks

## 2022-01-08 DIAGNOSIS — I10 Essential (primary) hypertension: Secondary | ICD-10-CM | POA: Diagnosis not present

## 2022-01-08 DIAGNOSIS — K219 Gastro-esophageal reflux disease without esophagitis: Secondary | ICD-10-CM | POA: Diagnosis not present

## 2022-01-08 DIAGNOSIS — E119 Type 2 diabetes mellitus without complications: Secondary | ICD-10-CM | POA: Diagnosis not present

## 2022-01-08 DIAGNOSIS — I4891 Unspecified atrial fibrillation: Secondary | ICD-10-CM | POA: Diagnosis not present

## 2022-01-10 ENCOUNTER — Other Ambulatory Visit (INDEPENDENT_AMBULATORY_CARE_PROVIDER_SITE_OTHER): Payer: Self-pay | Admitting: Gastroenterology

## 2022-01-10 ENCOUNTER — Ambulatory Visit (HOSPITAL_COMMUNITY)
Admission: RE | Admit: 2022-01-10 | Discharge: 2022-01-10 | Disposition: A | Discharge status: Home / Self Care | Payer: Medicare Other | Source: Ambulatory Visit | Attending: Gastroenterology | Admitting: Gastroenterology

## 2022-01-10 DIAGNOSIS — K862 Cyst of pancreas: Secondary | ICD-10-CM | POA: Insufficient documentation

## 2022-01-10 DIAGNOSIS — K449 Diaphragmatic hernia without obstruction or gangrene: Secondary | ICD-10-CM | POA: Diagnosis not present

## 2022-01-10 DIAGNOSIS — R935 Abnormal findings on diagnostic imaging of other abdominal regions, including retroperitoneum: Secondary | ICD-10-CM | POA: Diagnosis not present

## 2022-01-10 DIAGNOSIS — R932 Abnormal findings on diagnostic imaging of liver and biliary tract: Secondary | ICD-10-CM | POA: Diagnosis not present

## 2022-01-10 MED ORDER — GADOBUTROL 1 MMOL/ML IV SOLN
7.0000 mL | Freq: Once | INTRAVENOUS | Status: AC | PRN
  Administered 2022-01-10: 7 mL via INTRAVENOUS

## 2022-01-11 ENCOUNTER — Telehealth (INDEPENDENT_AMBULATORY_CARE_PROVIDER_SITE_OTHER): Payer: Self-pay | Admitting: *Deleted

## 2022-01-11 NOTE — Telephone Encounter (Signed)
Thanks, I'll call her later

## 2022-01-11 NOTE — Telephone Encounter (Signed)
Patient called to get results of MRI. [phone]

## 2022-01-12 DIAGNOSIS — E538 Deficiency of other specified B group vitamins: Secondary | ICD-10-CM | POA: Diagnosis not present

## 2022-01-15 ENCOUNTER — Ambulatory Visit (INDEPENDENT_AMBULATORY_CARE_PROVIDER_SITE_OTHER): Payer: Medicare Other | Admitting: Gastroenterology

## 2022-01-17 DIAGNOSIS — E785 Hyperlipidemia, unspecified: Secondary | ICD-10-CM | POA: Diagnosis not present

## 2022-01-17 DIAGNOSIS — E119 Type 2 diabetes mellitus without complications: Secondary | ICD-10-CM | POA: Diagnosis not present

## 2022-01-17 DIAGNOSIS — I10 Essential (primary) hypertension: Secondary | ICD-10-CM | POA: Diagnosis not present

## 2022-01-26 DIAGNOSIS — Z299 Encounter for prophylactic measures, unspecified: Secondary | ICD-10-CM | POA: Diagnosis not present

## 2022-01-26 DIAGNOSIS — I10 Essential (primary) hypertension: Secondary | ICD-10-CM | POA: Diagnosis not present

## 2022-01-26 DIAGNOSIS — M25552 Pain in left hip: Secondary | ICD-10-CM | POA: Diagnosis not present

## 2022-01-30 ENCOUNTER — Other Ambulatory Visit (INDEPENDENT_AMBULATORY_CARE_PROVIDER_SITE_OTHER): Payer: Self-pay | Admitting: Gastroenterology

## 2022-01-30 DIAGNOSIS — R140 Abdominal distension (gaseous): Secondary | ICD-10-CM

## 2022-02-12 DIAGNOSIS — E538 Deficiency of other specified B group vitamins: Secondary | ICD-10-CM | POA: Diagnosis not present

## 2022-02-12 DIAGNOSIS — R252 Cramp and spasm: Secondary | ICD-10-CM | POA: Diagnosis not present

## 2022-02-12 DIAGNOSIS — I10 Essential (primary) hypertension: Secondary | ICD-10-CM | POA: Diagnosis not present

## 2022-02-12 DIAGNOSIS — E1165 Type 2 diabetes mellitus with hyperglycemia: Secondary | ICD-10-CM | POA: Diagnosis not present

## 2022-02-12 DIAGNOSIS — Z299 Encounter for prophylactic measures, unspecified: Secondary | ICD-10-CM | POA: Diagnosis not present

## 2022-02-15 DIAGNOSIS — I10 Essential (primary) hypertension: Secondary | ICD-10-CM | POA: Diagnosis not present

## 2022-02-15 DIAGNOSIS — E119 Type 2 diabetes mellitus without complications: Secondary | ICD-10-CM | POA: Diagnosis not present

## 2022-02-21 ENCOUNTER — Telehealth (INDEPENDENT_AMBULATORY_CARE_PROVIDER_SITE_OTHER): Payer: Self-pay

## 2022-02-21 NOTE — Telephone Encounter (Signed)
Per Katrina Castro patient had called the office and asked that someone give her a return call. I have left her a message asked that if she still needs assistance to please return call.

## 2022-02-27 DIAGNOSIS — Z789 Other specified health status: Secondary | ICD-10-CM | POA: Diagnosis not present

## 2022-02-27 DIAGNOSIS — R42 Dizziness and giddiness: Secondary | ICD-10-CM | POA: Diagnosis not present

## 2022-02-27 DIAGNOSIS — I4891 Unspecified atrial fibrillation: Secondary | ICD-10-CM | POA: Diagnosis not present

## 2022-02-27 DIAGNOSIS — Z299 Encounter for prophylactic measures, unspecified: Secondary | ICD-10-CM | POA: Diagnosis not present

## 2022-02-27 DIAGNOSIS — I10 Essential (primary) hypertension: Secondary | ICD-10-CM | POA: Diagnosis not present

## 2022-03-01 ENCOUNTER — Other Ambulatory Visit: Payer: Self-pay | Admitting: Student

## 2022-03-06 DIAGNOSIS — W19XXXA Unspecified fall, initial encounter: Secondary | ICD-10-CM | POA: Diagnosis not present

## 2022-03-06 DIAGNOSIS — Z9181 History of falling: Secondary | ICD-10-CM | POA: Diagnosis not present

## 2022-03-06 DIAGNOSIS — M7989 Other specified soft tissue disorders: Secondary | ICD-10-CM | POA: Diagnosis not present

## 2022-03-06 DIAGNOSIS — M25561 Pain in right knee: Secondary | ICD-10-CM | POA: Diagnosis not present

## 2022-03-06 DIAGNOSIS — I10 Essential (primary) hypertension: Secondary | ICD-10-CM | POA: Diagnosis not present

## 2022-03-06 DIAGNOSIS — Z96651 Presence of right artificial knee joint: Secondary | ICD-10-CM | POA: Diagnosis not present

## 2022-03-06 DIAGNOSIS — M25521 Pain in right elbow: Secondary | ICD-10-CM | POA: Diagnosis not present

## 2022-03-07 DIAGNOSIS — S338XXA Sprain of other parts of lumbar spine and pelvis, initial encounter: Secondary | ICD-10-CM | POA: Diagnosis not present

## 2022-03-07 DIAGNOSIS — M47816 Spondylosis without myelopathy or radiculopathy, lumbar region: Secondary | ICD-10-CM | POA: Diagnosis not present

## 2022-03-07 DIAGNOSIS — M546 Pain in thoracic spine: Secondary | ICD-10-CM | POA: Diagnosis not present

## 2022-03-07 DIAGNOSIS — M9903 Segmental and somatic dysfunction of lumbar region: Secondary | ICD-10-CM | POA: Diagnosis not present

## 2022-03-07 DIAGNOSIS — S233XXA Sprain of ligaments of thoracic spine, initial encounter: Secondary | ICD-10-CM | POA: Diagnosis not present

## 2022-03-07 DIAGNOSIS — M9901 Segmental and somatic dysfunction of cervical region: Secondary | ICD-10-CM | POA: Diagnosis not present

## 2022-03-07 DIAGNOSIS — M47812 Spondylosis without myelopathy or radiculopathy, cervical region: Secondary | ICD-10-CM | POA: Diagnosis not present

## 2022-03-07 DIAGNOSIS — M9902 Segmental and somatic dysfunction of thoracic region: Secondary | ICD-10-CM | POA: Diagnosis not present

## 2022-03-13 DIAGNOSIS — M25561 Pain in right knee: Secondary | ICD-10-CM | POA: Diagnosis not present

## 2022-03-13 DIAGNOSIS — Z96651 Presence of right artificial knee joint: Secondary | ICD-10-CM | POA: Diagnosis not present

## 2022-03-14 DIAGNOSIS — Z789 Other specified health status: Secondary | ICD-10-CM | POA: Diagnosis not present

## 2022-03-14 DIAGNOSIS — E538 Deficiency of other specified B group vitamins: Secondary | ICD-10-CM | POA: Diagnosis not present

## 2022-03-14 DIAGNOSIS — I10 Essential (primary) hypertension: Secondary | ICD-10-CM | POA: Diagnosis not present

## 2022-03-14 DIAGNOSIS — Z23 Encounter for immunization: Secondary | ICD-10-CM | POA: Diagnosis not present

## 2022-03-14 DIAGNOSIS — Z299 Encounter for prophylactic measures, unspecified: Secondary | ICD-10-CM | POA: Diagnosis not present

## 2022-03-14 DIAGNOSIS — S80219A Abrasion, unspecified knee, initial encounter: Secondary | ICD-10-CM | POA: Diagnosis not present

## 2022-03-16 DIAGNOSIS — M47812 Spondylosis without myelopathy or radiculopathy, cervical region: Secondary | ICD-10-CM | POA: Diagnosis not present

## 2022-03-16 DIAGNOSIS — S338XXA Sprain of other parts of lumbar spine and pelvis, initial encounter: Secondary | ICD-10-CM | POA: Diagnosis not present

## 2022-03-16 DIAGNOSIS — M9901 Segmental and somatic dysfunction of cervical region: Secondary | ICD-10-CM | POA: Diagnosis not present

## 2022-03-16 DIAGNOSIS — S233XXA Sprain of ligaments of thoracic spine, initial encounter: Secondary | ICD-10-CM | POA: Diagnosis not present

## 2022-03-16 DIAGNOSIS — M9902 Segmental and somatic dysfunction of thoracic region: Secondary | ICD-10-CM | POA: Diagnosis not present

## 2022-03-16 DIAGNOSIS — M546 Pain in thoracic spine: Secondary | ICD-10-CM | POA: Diagnosis not present

## 2022-03-16 DIAGNOSIS — M9903 Segmental and somatic dysfunction of lumbar region: Secondary | ICD-10-CM | POA: Diagnosis not present

## 2022-03-16 DIAGNOSIS — M47816 Spondylosis without myelopathy or radiculopathy, lumbar region: Secondary | ICD-10-CM | POA: Diagnosis not present

## 2022-03-17 DIAGNOSIS — E119 Type 2 diabetes mellitus without complications: Secondary | ICD-10-CM | POA: Diagnosis not present

## 2022-03-17 DIAGNOSIS — I10 Essential (primary) hypertension: Secondary | ICD-10-CM | POA: Diagnosis not present

## 2022-03-19 DIAGNOSIS — Z7189 Other specified counseling: Secondary | ICD-10-CM | POA: Diagnosis not present

## 2022-03-19 DIAGNOSIS — I25119 Atherosclerotic heart disease of native coronary artery with unspecified angina pectoris: Secondary | ICD-10-CM | POA: Diagnosis not present

## 2022-03-19 DIAGNOSIS — Z0000 Encounter for general adult medical examination without abnormal findings: Secondary | ICD-10-CM | POA: Diagnosis not present

## 2022-03-19 DIAGNOSIS — Z299 Encounter for prophylactic measures, unspecified: Secondary | ICD-10-CM | POA: Diagnosis not present

## 2022-03-19 DIAGNOSIS — I10 Essential (primary) hypertension: Secondary | ICD-10-CM | POA: Diagnosis not present

## 2022-03-19 DIAGNOSIS — Z789 Other specified health status: Secondary | ICD-10-CM | POA: Diagnosis not present

## 2022-03-20 DIAGNOSIS — S338XXA Sprain of other parts of lumbar spine and pelvis, initial encounter: Secondary | ICD-10-CM | POA: Diagnosis not present

## 2022-03-20 DIAGNOSIS — S233XXA Sprain of ligaments of thoracic spine, initial encounter: Secondary | ICD-10-CM | POA: Diagnosis not present

## 2022-03-20 DIAGNOSIS — M9903 Segmental and somatic dysfunction of lumbar region: Secondary | ICD-10-CM | POA: Diagnosis not present

## 2022-03-20 DIAGNOSIS — M546 Pain in thoracic spine: Secondary | ICD-10-CM | POA: Diagnosis not present

## 2022-03-20 DIAGNOSIS — M9902 Segmental and somatic dysfunction of thoracic region: Secondary | ICD-10-CM | POA: Diagnosis not present

## 2022-03-20 DIAGNOSIS — M9901 Segmental and somatic dysfunction of cervical region: Secondary | ICD-10-CM | POA: Diagnosis not present

## 2022-03-20 DIAGNOSIS — M47812 Spondylosis without myelopathy or radiculopathy, cervical region: Secondary | ICD-10-CM | POA: Diagnosis not present

## 2022-03-20 DIAGNOSIS — M47816 Spondylosis without myelopathy or radiculopathy, lumbar region: Secondary | ICD-10-CM | POA: Diagnosis not present

## 2022-03-22 DIAGNOSIS — E119 Type 2 diabetes mellitus without complications: Secondary | ICD-10-CM | POA: Diagnosis not present

## 2022-03-22 DIAGNOSIS — I10 Essential (primary) hypertension: Secondary | ICD-10-CM | POA: Diagnosis not present

## 2022-03-23 ENCOUNTER — Ambulatory Visit: Payer: Medicare Other | Attending: Internal Medicine | Admitting: Internal Medicine

## 2022-03-23 ENCOUNTER — Encounter: Payer: Self-pay | Admitting: Internal Medicine

## 2022-03-23 ENCOUNTER — Other Ambulatory Visit (HOSPITAL_COMMUNITY)
Admission: RE | Admit: 2022-03-23 | Discharge: 2022-03-23 | Disposition: A | Discharge status: Home / Self Care | Payer: Medicare Other | Source: Ambulatory Visit | Attending: Internal Medicine | Admitting: Internal Medicine

## 2022-03-23 VITALS — BP 114/60 | HR 77 | Ht 63.0 in | Wt 140.0 lb

## 2022-03-23 DIAGNOSIS — I10 Essential (primary) hypertension: Secondary | ICD-10-CM | POA: Diagnosis not present

## 2022-03-23 DIAGNOSIS — I2510 Atherosclerotic heart disease of native coronary artery without angina pectoris: Secondary | ICD-10-CM

## 2022-03-23 DIAGNOSIS — E1159 Type 2 diabetes mellitus with other circulatory complications: Secondary | ICD-10-CM

## 2022-03-23 DIAGNOSIS — E785 Hyperlipidemia, unspecified: Secondary | ICD-10-CM

## 2022-03-23 LAB — LIPID PANEL
Cholesterol: 112 mg/dL (ref 0–200)
HDL: 48 mg/dL (ref >40)
LDL Cholesterol: 36 mg/dL (ref 0–99)
Total CHOL/HDL Ratio: 2.3 RATIO
Triglycerides: 138 mg/dL (ref <150)
VLDL: 28 mg/dL (ref 0–40)

## 2022-03-23 LAB — COMPREHENSIVE METABOLIC PANEL
ALT: 28 U/L (ref 0–44)
AST: 27 U/L (ref 15–41)
Albumin: 3.8 g/dL (ref 3.5–5.0)
Alkaline Phosphatase: 48 U/L (ref 38–126)
Anion gap: 6 (ref 5–15)
BUN: 12 mg/dL (ref 8–23)
CO2: 27 mmol/L (ref 22–32)
Calcium: 9.4 mg/dL (ref 8.9–10.3)
Chloride: 109 mmol/L (ref 98–111)
Creatinine, Ser: 0.76 mg/dL (ref 0.44–1.00)
GFR, Estimated: >60 mL/min (ref >60)
Glucose, Bld: 136 mg/dL — ABNORMAL HIGH (ref 70–99)
Potassium: 3.7 mmol/L (ref 3.5–5.1)
Sodium: 142 mmol/L (ref 135–145)
Total Bilirubin: 0.3 mg/dL (ref 0.3–1.2)
Total Protein: 6.9 g/dL (ref 6.5–8.1)

## 2022-03-23 LAB — CBC
HCT: 43.1 % (ref 36.0–46.0)
Hemoglobin: 13.3 g/dL (ref 12.0–15.0)
MCH: 27.9 pg (ref 26.0–34.0)
MCHC: 30.9 g/dL (ref 30.0–36.0)
MCV: 90.5 fL (ref 80.0–100.0)
Platelets: ADEQUATE 10*3/uL (ref 150–400)
RBC: 4.76 MIL/uL (ref 3.87–5.11)
RDW: 14.5 % (ref 11.5–15.5)
WBC: 8.3 10*3/uL (ref 4.0–10.5)
nRBC: 0 % (ref 0.0–0.2)

## 2022-03-23 LAB — HEMOGLOBIN A1C
Hgb A1c MFr Bld: 7.8 % — ABNORMAL HIGH (ref 4.8–5.6)
Mean Plasma Glucose: 177.16 mg/dL

## 2022-03-23 LAB — TSH: TSH: 1.199 u[IU]/mL (ref 0.350–4.500)

## 2022-03-23 NOTE — Patient Instructions (Signed)
Medication Instructions:  Your physician recommends that you continue on your current medications as directed. Please refer to the Current Medication list given to you today.   Labwork: -CBC -TSH -CMET -Lipid -A1C  Testing/Procedures: None  Follow-Up: Follow up with Dr. Harrington Challenger in 6 months.   Any Other Special Instructions Will Be Listed Below (If Applicable).     If you need a refill on your cardiac medications before your next appointment, please call your pharmacy.

## 2022-03-23 NOTE — Progress Notes (Signed)
Cardiology Office Note   Date:  03/23/2022   ID:  Katrina Castro, DOB 12-25-1945, MRN [id number]  PCP:  Glenda Chroman, MD  Cardiologist:   Dorris Carnes, MD    F/U of CAD    History of Present Illness: Katrina Castro is a 76 y.o. female with a history of CAD (s/p CABG in Nov 2021 (LIMA to LAD; SVG to PDA, PLSA; L radial to RI, OM1), HTN, HL and Type II DM   Post CABG had post op afib, UTI, diarrhea.   The pt has also had chronic chest pains    The pt is also followed at [facility] in GI division   Has problems with motility     I saw the pt in the spring   At the time she complained of chest wall tenderness  Since seen she has done OK overall   Says she is fatigued but then if has something sugary will perk up Cares for a 76 yo pt (caregiving) Has chest wall pain but no other chest pain    Breathing is stable   She fell at [employer] knee   She says she tripped up on cord   No dizziness  Current Meds  Medication Sig   acetaminophen (TYLENOL) 650 MG CR tablet Take 650 mg by mouth every 8 (eight) hours as needed for pain.   acyclovir (ZOVIRAX) 400 MG tablet Take 400 mg by mouth daily.   ALPRAZolam (XANAX) 0.5 MG tablet Take 0.25 mg by mouth at bedtime as needed for anxiety or sleep.   aspirin 81 MG EC tablet Take 1 tablet (81 mg total) by mouth daily. Swallow whole.   Biotin w/ Vitamins C & E (HAIR SKIN & NAILS GUMMIES PO) Take 2 capsules by mouth daily.   Cholecalciferol (VITAMIN D) 50 MCG (2000 UT) tablet Take 2,000 Units by mouth daily.   clotrimazole (LOTRIMIN) 1 % cream Apply 1 application topically 2 (two) times daily as needed (lip cracking).   clotrimazole-betamethasone (LOTRISONE) cream Apply 1 application topically 2 (two) times daily.   Cyanocobalamin (B-12 COMPLIANCE INJECTION IJ) Inject 1 Dose as directed every 30 (thirty) days.   Cyanocobalamin (B-12 PO) Take 1 tablet by mouth daily.   dapagliflozin propanediol (FARXIGA) 10 MG TABS tablet Take 1 tablet (10 mg  total) by mouth daily before breakfast.   diclofenac Sodium (VOLTAREN) 1 % GEL Apply 1 application topically 4 (four) times daily as needed (pain).   dicyclomine (BENTYL) 10 MG capsule Take 1 capsule (10 mg total) by mouth every 12 (twelve) hours as needed for spasms (abdominal pain).   ezetimibe (ZETIA) 10 MG tablet TAKE 1 TABLET ONCE DAILY.   furosemide (LASIX) 40 MG tablet Take 40 mg every THIRD day until your weight is back to baseline   glipiZIDE (GLUCOTROL XL) 2.5 MG 24 hr tablet Take 1 tablet (2.5 mg total) by mouth daily with breakfast.   levocetirizine (XYZAL) 5 MG tablet Take 5 mg by mouth daily.   Menthol-Methyl Salicylate (MUSCLE RUB EX) Apply 1 application topically daily as needed (muscle pain). [place] gel   metFORMIN (GLUCOPHAGE-XR) 500 MG 24 hr tablet Take 1 tablet (500 mg total) by mouth daily with breakfast.   metoprolol tartrate (LOPRESSOR) 25 MG tablet TAKE (1/2) TABLET BY MOUTH 2 TIMES A DAY.   Multiple Vitamins-Minerals (ADULT GUMMY PO) Take 2 capsules by mouth daily.   nitroGLYCERIN (NITROSTAT) 0.4 MG SL tablet Place 1 tablet (0.4 mg total) under  the tongue every 5 (five) minutes as needed for chest pain.   oxybutynin (DITROPAN-XL) 10 MG 24 hr tablet Take 10 mg by mouth daily.   pantoprazole (PROTONIX) 40 MG tablet Take 40 mg by mouth daily.   potassium chloride (KLOR-CON) 10 MEQ tablet Take 10 meq every THIRD day until weight is back to baseline. Take with Lasix   PRESCRIPTION MEDICATION Apply 1 application topically 2 (two) times a week. Dexameth Na Ph 0.4%   rosuvastatin (CRESTOR) 40 MG tablet TAKE 1 TABLET BY MOUTH ONCE DAILY.   urea (CARMOL) 40 % CREA Apply 1 application topically daily.     Allergies:   Levofloxacin, Cardizem [diltiazem], Sulfa antibiotics, Diazepam, and Lipitor [atorvastatin]   Past Medical History:  Diagnosis Date   Arthritis    CAD (coronary artery disease)    a. s/p CABG on 04/27/2020 with LIMA-LAD, seq SVG-PDA-PL, seq left radial-RI-OM    Concussion    2015   Depression    Diabetes ([place])    type 2   Dysphagia    Dysrhythmia    afib   GERD (gastroesophageal reflux disease)    Headache    History of bronchitis    Hypertension    Seasonal allergies    Toenail fungus     Past Surgical History:  Procedure Laterality Date   APPENDECTOMY     BACK SURGERY     x2   BOTOX INJECTION N/A 06/06/2016   Procedure: BOTOX INJECTION;  Surgeon: Rogene Houston, MD;  [facility];  Service: Endoscopy;  Laterality: N/A;   BOTOX INJECTION N/A 01/17/2021   Procedure: BOTOX INJECTION;  Surgeon: Harvel Quale, MD;  [facility];  Service: Gastroenterology;  Laterality: N/A;   BOTOX INJECTION N/A 07/11/2021   Procedure: BOTOX INJECTION;  Surgeon: Harvel Quale, MD;  [facility];  Service: Gastroenterology;  Laterality: N/A;   CHOLECYSTECTOMY     COLONOSCOPY N/A 11/05/2012   Procedure: COLONOSCOPY;  Surgeon: Rogene Houston, MD;  [facility];  Service: Endoscopy;  Laterality: N/A;  1030   COLONOSCOPY WITH PROPOFOL N/A 05/01/2019   Procedure: COLONOSCOPY WITH PROPOFOL;  Surgeon: Rogene Houston, MD;  [facility];  Service: Endoscopy;  Laterality: N/A;  11:20am   CORONARY ARTERY BYPASS GRAFT N/A 04/27/2020   Procedure: CORONARY ARTERY BYPASS GRAFTING (CABG) TIMES FIVE USING LEFT INTERNAL MAMMARY ARTERY, LEFT HARVESTED RADIAL ARTERY, RIGHT GREATER SAPHENOUS VEIN HARVESTED ENDOSCOPICALLY.;  Surgeon: [name], MD;  Location: [facility];  Service: Open Heart Surgery;  Laterality: N/A;   ESOPHAGEAL DILATION N/A 07/06/2015   Procedure: ESOPHAGEAL DILATION;  Surgeon: Rogene Houston, MD;  [facility];  Service: Endoscopy;  Laterality: N/A;   ESOPHAGEAL DILATION N/A 06/06/2016   Procedure: ESOPHAGEAL DILATION;  Surgeon: Rogene Houston, MD;  [facility];  Service: Endoscopy;  Laterality: N/A;   ESOPHAGOGASTRODUODENOSCOPY N/A 02/12/2014    Procedure: ESOPHAGOGASTRODUODENOSCOPY (EGD);  Surgeon: Rogene Houston, MD;  [facility];  Service: Endoscopy;  Laterality: N/A;  150   ESOPHAGOGASTRODUODENOSCOPY N/A 07/06/2015   Procedure: ESOPHAGOGASTRODUODENOSCOPY (EGD);  Surgeon: Rogene Houston, MD;  [facility];  Service: Endoscopy;  Laterality: N/A;  1:25 - moved to 1/18 @ 10:30 - Ann to notify pt   ESOPHAGOGASTRODUODENOSCOPY (EGD) WITH ESOPHAGEAL DILATION N/A 07/25/2012   Procedure: ESOPHAGOGASTRODUODENOSCOPY (EGD) WITH ESOPHAGEAL DILATION;  Surgeon: Rogene Houston, MD;  [facility];  Service: Endoscopy;  Laterality:  N/A;  325-rescheduled to 61 Ann notified pt   ESOPHAGOGASTRODUODENOSCOPY (EGD) WITH PROPOFOL N/A 06/06/2016   Procedure: ESOPHAGOGASTRODUODENOSCOPY (EGD) WITH PROPOFOL;  Surgeon: Rogene Houston, MD;  [facility];  Service: Endoscopy;  Laterality: N/A;   ESOPHAGOGASTRODUODENOSCOPY (EGD) WITH PROPOFOL N/A 01/17/2021   Procedure: ESOPHAGOGASTRODUODENOSCOPY (EGD) WITH PROPOFOL;  Surgeon: Harvel Quale, MD;  [facility];  Service: Gastroenterology;  Laterality: N/A;  10:35   ESOPHAGOGASTRODUODENOSCOPY (EGD) WITH PROPOFOL N/A 07/11/2021   Procedure: ESOPHAGOGASTRODUODENOSCOPY (EGD) WITH PROPOFOL;  Surgeon: Harvel Quale, MD;  [facility];  Service: Gastroenterology;  Laterality: N/A;  11:15   EYE SURGERY     cataracts removed   Foot surgeries Bilateral    hammer toes   LEFT HEART CATH AND CORONARY ANGIOGRAPHY N/A 04/26/2020   Procedure: LEFT HEART CATH AND CORONARY ANGIOGRAPHY;  Surgeon: Martinique, Peter M, MD;  Location: [facility];  Service: Cardiovascular;  Laterality: N/A;   MALONEY DILATION N/A 02/12/2014   Procedure: Venia Minks DILATION;  Surgeon: Rogene Houston, MD;  [facility];  Service: Endoscopy;  Laterality: N/A;   POLYPECTOMY  05/01/2019   Procedure: POLYPECTOMY;  Surgeon: Rogene Houston, MD;  [facility];  Service: Endoscopy;;  colon    RADIAL ARTERY HARVEST Left 04/27/2020   Procedure: LEFT RADIAL ARTERY HARVEST;  Surgeon: [name], MD;  Location: [facility];  Service: Open Heart Surgery;  Laterality: Left;   Right knee arthroscopy     x2   TEE WITHOUT CARDIOVERSION N/A 04/27/2020   Procedure: TRANSESOPHAGEAL ECHOCARDIOGRAM (TEE);  Surgeon: [name], MD;  Location: [facility];  Service: Open Heart Surgery;  Laterality: N/A;   TONSILLECTOMY     TOTAL ABDOMINAL HYSTERECTOMY     TOTAL KNEE ARTHROPLASTY Right 04/03/2017   Procedure: RIGHT TOTAL KNEE ARTHROPLASTY;  Surgeon: Latanya Maudlin, MD;  Location: [facility];  Service: Orthopedics;  Laterality: Right;     Social History:  The patient  reports that she has never smoked. She has never used smokeless tobacco. She reports that she does not drink alcohol and does not use drugs.   Family History:  The patient's family history includes Diabetes in her mother and sister; Stroke in her father.    ROS:  Please see the history of present illness. All other systems are reviewed and  Negative to the above problem except as noted.    PHYSICAL EXAM: VS:  BP 114/60   Pulse 77   Ht '5\' 3"'$  (1.6 m)   Wt 140 lb (63.5 kg)   SpO2 97%   BMI 24.80 kg/m   GEN: Patient is in NAD  HEENT: normal  Neck: no JVD, Cardiac: RRR S1, S2.  No signficant murmurs    No LE edema  Chest:   Tender lower part of incision, small nodule (? Sebaceous plug) Respiratory:  clear to auscultation bilaterally,  GI: soft, nontender, nondistended, + BS  No hepatomegaly  MS: no deformity Moving all extremities   Skin: warm and dry, no rash Neuro:  Strength and sensation are intact Psych: euthymic mood, full affect   EKG:  EKG is ordered today.   NSR 68 bpm     Echo  02/17/21   1. Left ventricular ejection fraction, by estimation, is 60 to 65%. The  left ventricle has normal function. The left ventricle has no regional  wall motion abnormalities. There is mild  left ventricular hypertrophy.  Left ventricular diastolic parameters  are indeterminate.   2. Right ventricular systolic function is mildly ro moderately reduced.  The right ventricular size is mildly enlarged. Tricuspid regurgitation  signal is inadequate for assessing PA pressure.   3. The mitral valve is grossly normal. Trivial mitral valve  regurgitation.   4. The aortic valve was not well visualized. Aortic valve regurgitation  is not visualized.   5. The inferior vena cava is normal in size with greater than 50%  respiratory variability, suggesting right atrial pressure of 3 mmHg.    Lipid Panel    Component Value Date/Time   CHOL 110 07/21/2021 1458   CHOL 217 (H) 03/31/2020 1208   TRIG 167 (H) 07/21/2021 1458   HDL 38 (L) 07/21/2021 1458   HDL 38 (L) 03/31/2020 1208   CHOLHDL 2.9 07/21/2021 1458   VLDL 33 07/21/2021 1458   LDLCALC 39 07/21/2021 1458   LDLCALC 144 (H) 03/31/2020 1208      Wt Readings from Last 3 Encounters:  03/23/22 140 lb (63.5 kg)  11/17/21 139 lb (63 kg)  10/25/21 141 lb 3.2 oz (64 kg)      ASSESSMENT AND PLAN:  1  CAD   CABG in 2021   She complains of fatigue at times but I think overall she is doing well from a cardiac standpoint    Follow     2  Sternal tenderness.   Tender but looks like it is well healed     3 HTN  BP is well controlled on current regimen    4  HL  Last labs    LDL 39  HDL 38  Trig   167    Watch sugar/carb  Will recheck  5  DM   Will check Hgb A1C    Current medicines are reviewed at length with the patient today.  The patient does not have concerns regarding medicines.  Signed, Dorris Carnes, MD  03/23/2022 1:10 PM    [facility] [address], [place], [place]  [address] Phone: [phone]; Fax: [phone]

## 2022-03-27 ENCOUNTER — Ambulatory Visit: Payer: Medicare Other | Admitting: Internal Medicine

## 2022-03-27 NOTE — Progress Notes (Deleted)
Patient ID: Katrina Castro, female   DOB: 14-May-1946, 76 y.o.   MRN: [id number]  HPI: Katrina Castro is a 76 y.o.-year-old female, returning for follow-up for DM2, dx in 2015, non-insulin-dependent, uncontrolled, with complications (CAD, s/p CABG; PN). Pt. previously saw Dr. Loanne Drilling.  Last visit with me 4 months ago.  Interim history: + increased urination, but no blurry vision, nausea, chest pain.  Reviewed HbA1c: Lab Results  Component Value Date   HGBA1C 7.8 (H) 03/23/2022   HGBA1C 7.0 (A) 11/17/2021   HGBA1C 7.3 (A) 09/20/2021   HGBA1C 8.0 (A) 07/26/2021   HGBA1C 6.4 (H) 05/12/2020   HGBA1C 7.5 (H) 04/25/2020   HGBA1C 7.5 (H) 04/25/2020   Pt is on a regimen of: - Metformin ER 500 mg 2x daily - Glipizide ER 2.5 mg before breakfast - Farxiga 10 mg before breakfast -insulin 55$ per mo, now 143$ per mo She tried Rybelsus >> nausea, but retrospectively, this was 2/2 Esophageal pbs.  Pt checks her sugars 0-1x a day and they are: - am: 62, 108-114, 160 (watermelon) - 2h after b'fast: n/c - before lunch: n/c - 2h after lunch: n/c - before dinner: n/c - 2h after dinner: n/c - bedtime: n/c - nighttime: n/c Lowest sugar was 62 (took Glipizide at bedtime as she forgot it in am); she has hypoglycemia awareness at 70.  Highest sugar was 160.  Glucometer:Prodigy  - no CKD, last BUN/creatinine:  Lab Results  Component Value Date   BUN 12 03/23/2022   BUN 15 07/21/2021   CREATININE 0.76 03/23/2022   CREATININE 0.76 07/21/2021  She is not on ACE inhibitor or ARB.  -+ HL; last set of lipids: Lab Results  Component Value Date   CHOL 112 03/23/2022   HDL 48 03/23/2022   LDLCALC 36 03/23/2022   TRIG 138 03/23/2022   CHOLHDL 2.3 03/23/2022  On Crestor 40, Zetia 10.  - last eye exam was in 09/2021. No DR - reportedly.   - no numbness and tingling in her feet.  She has a history of left foot surgery.  Last foot exam 11/2021.  She also has a history of atrial  fibrillation, HTN, GERD, depression. She is on B12 supplements.  She goes to the gym - treadmill.  She was abandoned by her mother in a hotel when she was 76 weeks old. She is working part time - caregiver for cancer patient.  She used to drive a truck for many years.  ROS: + see HPI  Past Medical History:  Diagnosis Date   Arthritis    CAD (coronary artery disease)    a. s/p CABG on 04/27/2020 with LIMA-LAD, seq SVG-PDA-PL, seq left radial-RI-OM   Concussion    2015   Depression    Diabetes ([place])    type 2   Dysphagia    Dysrhythmia    afib   GERD (gastroesophageal reflux disease)    Headache    History of bronchitis    Hypertension    Seasonal allergies    Toenail fungus    Past Surgical History:  Procedure Laterality Date   APPENDECTOMY     BACK SURGERY     x2   BOTOX INJECTION N/A 06/06/2016   Procedure: BOTOX INJECTION;  Surgeon: Rogene Houston, MD;  [facility];  Service: Endoscopy;  Laterality: N/A;   BOTOX INJECTION N/A 01/17/2021   Procedure: BOTOX INJECTION;  Surgeon: Harvel Quale, MD;  [facility];  Service: Gastroenterology;  Laterality: N/A;   BOTOX INJECTION N/A 07/11/2021   Procedure: BOTOX INJECTION;  Surgeon: Harvel Quale, MD;  [facility];  Service: Gastroenterology;  Laterality: N/A;   CHOLECYSTECTOMY     COLONOSCOPY N/A 11/05/2012   Procedure: COLONOSCOPY;  Surgeon: Rogene Houston, MD;  [facility];  Service: Endoscopy;  Laterality: N/A;  1030   COLONOSCOPY WITH PROPOFOL N/A 05/01/2019   Procedure: COLONOSCOPY WITH PROPOFOL;  Surgeon: Rogene Houston, MD;  [facility];  Service: Endoscopy;  Laterality: N/A;  11:20am   CORONARY ARTERY BYPASS GRAFT N/A 04/27/2020   Procedure: CORONARY ARTERY BYPASS GRAFTING (CABG) TIMES FIVE USING LEFT INTERNAL MAMMARY ARTERY, LEFT HARVESTED RADIAL ARTERY, RIGHT GREATER SAPHENOUS VEIN HARVESTED ENDOSCOPICALLY.;  Surgeon: Katrina Olds, MD;  Location: [facility];  Service: Open Heart Surgery;  Laterality: N/A;   ESOPHAGEAL DILATION N/A 07/06/2015   Procedure: ESOPHAGEAL DILATION;  Surgeon: Rogene Houston, MD;  [facility];  Service: Endoscopy;  Laterality: N/A;   ESOPHAGEAL DILATION N/A 06/06/2016   Procedure: ESOPHAGEAL DILATION;  Surgeon: Rogene Houston, MD;  [facility];  Service: Endoscopy;  Laterality: N/A;   ESOPHAGOGASTRODUODENOSCOPY N/A 02/12/2014   Procedure: ESOPHAGOGASTRODUODENOSCOPY (EGD);  Surgeon: Rogene Houston, MD;  [facility];  Service: Endoscopy;  Laterality: N/A;  150   ESOPHAGOGASTRODUODENOSCOPY N/A 07/06/2015   Procedure: ESOPHAGOGASTRODUODENOSCOPY (EGD);  Surgeon: Rogene Houston, MD;  [facility];  Service: Endoscopy;  Laterality: N/A;  1:25 - moved to 1/18 @ 10:30 - Ann to notify pt   ESOPHAGOGASTRODUODENOSCOPY (EGD) WITH ESOPHAGEAL DILATION N/A 07/25/2012   Procedure: ESOPHAGOGASTRODUODENOSCOPY (EGD) WITH ESOPHAGEAL DILATION;  Surgeon: Rogene Houston, MD;  [facility];  Service: Endoscopy;  Laterality: N/A;  325-rescheduled to [place] notified pt   ESOPHAGOGASTRODUODENOSCOPY (EGD) WITH PROPOFOL N/A 06/06/2016   Procedure: ESOPHAGOGASTRODUODENOSCOPY (EGD) WITH PROPOFOL;  Surgeon: Rogene Houston, MD;  [facility];  Service: Endoscopy;  Laterality: N/A;   ESOPHAGOGASTRODUODENOSCOPY (EGD) WITH PROPOFOL N/A 01/17/2021   Procedure: ESOPHAGOGASTRODUODENOSCOPY (EGD) WITH PROPOFOL;  Surgeon: Harvel Quale, MD;  [facility];  Service: Gastroenterology;  Laterality: N/A;  10:35   ESOPHAGOGASTRODUODENOSCOPY (EGD) WITH PROPOFOL N/A 07/11/2021   Procedure: ESOPHAGOGASTRODUODENOSCOPY (EGD) WITH PROPOFOL;  Surgeon: Harvel Quale, MD;  [facility];  Service: Gastroenterology;  Laterality: N/A;  11:15   EYE SURGERY     cataracts removed   Foot surgeries Bilateral    hammer toes   LEFT HEART CATH AND  CORONARY ANGIOGRAPHY N/A 04/26/2020   Procedure: LEFT HEART CATH AND CORONARY ANGIOGRAPHY;  Surgeon: Martinique, Peter M, MD;  Location: [facility];  Service: Cardiovascular;  Laterality: N/A;   MALONEY DILATION N/A 02/12/2014   Procedure: Venia Minks DILATION;  Surgeon: Rogene Houston, MD;  [facility];  Service: Endoscopy;  Laterality: N/A;   POLYPECTOMY  05/01/2019   Procedure: POLYPECTOMY;  Surgeon: Rogene Houston, MD;  [facility];  Service: Endoscopy;;  colon    RADIAL ARTERY HARVEST Left 04/27/2020   Procedure: LEFT RADIAL ARTERY HARVEST;  Surgeon: Katrina Olds, MD;  Location: [facility];  Service: Open Heart Surgery;  Laterality: Left;   Right knee arthroscopy     x2   TEE WITHOUT CARDIOVERSION N/A 04/27/2020   Procedure: TRANSESOPHAGEAL ECHOCARDIOGRAM (TEE);  Surgeon: Katrina Olds, MD;  Location: [facility];  Service: Open Heart Surgery;  Laterality: N/A;  TONSILLECTOMY     TOTAL ABDOMINAL HYSTERECTOMY     TOTAL KNEE ARTHROPLASTY Right 04/03/2017   Procedure: RIGHT TOTAL KNEE ARTHROPLASTY;  Surgeon: Latanya Maudlin, MD;  Location: [facility];  Service: Orthopedics;  Laterality: Right;   Social History   Socioeconomic History   Marital status: Divorced    Spouse name: Not on file   Number of children: Not on file   Years of education: Not on file   Highest education level: Not on file  Occupational History   Not on file  Tobacco Use   Smoking status: Never   Smokeless tobacco: Never  Vaping Use   Vaping Use: Never used  Substance and Sexual Activity   Alcohol use: No    Alcohol/week: 0.0 standard drinks of alcohol    Comment: socially    Drug use: No   Sexual activity: Not Currently    Partners: Male    Birth control/protection: None  Other Topics Concern   Not on file  Social History Narrative   Not on file   Social Determinants of Health   Financial Resource Strain: Not on file  Food Insecurity: Not on file  Transportation Needs: Not  on file  Physical Activity: Not on file  Stress: Not on file  Social Connections: Not on file  Intimate Partner Violence: Not on file   Current Outpatient Medications on File Prior to Visit  Medication Sig Dispense Refill   acetaminophen (TYLENOL) 650 MG CR tablet Take 650 mg by mouth every 8 (eight) hours as needed for pain.     acyclovir (ZOVIRAX) 400 MG tablet Take 400 mg by mouth daily.     ALPRAZolam (XANAX) 0.5 MG tablet Take 0.25 mg by mouth at bedtime as needed for anxiety or sleep.     aspirin 81 MG EC tablet Take 1 tablet (81 mg total) by mouth daily. Swallow whole. 30 tablet 11   Biotin w/ Vitamins C & E (HAIR SKIN & NAILS GUMMIES PO) Take 2 capsules by mouth daily.     Cholecalciferol (VITAMIN D) 50 MCG (2000 UT) tablet Take 2,000 Units by mouth daily.     clotrimazole (LOTRIMIN) 1 % cream Apply 1 application topically 2 (two) times daily as needed (lip cracking).     clotrimazole-betamethasone (LOTRISONE) cream Apply 1 application topically 2 (two) times daily. 30 g 0   Cyanocobalamin (B-12 COMPLIANCE INJECTION IJ) Inject 1 Dose as directed every 30 (thirty) days.     Cyanocobalamin (B-12 PO) Take 1 tablet by mouth daily.     dapagliflozin propanediol (FARXIGA) 10 MG TABS tablet Take 1 tablet (10 mg total) by mouth daily before breakfast. 90 tablet 3   diclofenac Sodium (VOLTAREN) 1 % GEL Apply 1 application topically 4 (four) times daily as needed (pain).     dicyclomine (BENTYL) 10 MG capsule Take 1 capsule (10 mg total) by mouth every 12 (twelve) hours as needed for spasms (abdominal pain). 30 capsule 0   ezetimibe (ZETIA) 10 MG tablet TAKE 1 TABLET ONCE DAILY. 90 tablet 3   furosemide (LASIX) 40 MG tablet Take 40 mg every THIRD day until your weight is back to baseline 30 tablet 0   glipiZIDE (GLUCOTROL XL) 2.5 MG 24 hr tablet Take 1 tablet (2.5 mg total) by mouth daily with breakfast. 90 tablet 3   levocetirizine (XYZAL) 5 MG tablet Take 5 mg by mouth daily.      Menthol-Methyl Salicylate (MUSCLE RUB EX) Apply 1 application topically daily as needed (muscle pain).  [place] gel     metFORMIN (GLUCOPHAGE-XR) 500 MG 24 hr tablet Take 1 tablet (500 mg total) by mouth daily with breakfast. 90 tablet 3   metoprolol tartrate (LOPRESSOR) 25 MG tablet TAKE (1/2) TABLET BY MOUTH 2 TIMES A DAY. 30 tablet 6   Multiple Vitamins-Minerals (ADULT GUMMY PO) Take 2 capsules by mouth daily.     nitroGLYCERIN (NITROSTAT) 0.4 MG SL tablet Place 1 tablet (0.4 mg total) under the tongue every 5 (five) minutes as needed for chest pain. 25 tablet 3   oxybutynin (DITROPAN-XL) 10 MG 24 hr tablet Take 10 mg by mouth daily.     pantoprazole (PROTONIX) 40 MG tablet Take 40 mg by mouth daily.     potassium chloride (KLOR-CON) 10 MEQ tablet Take 10 meq every THIRD day until weight is back to baseline. Take with Lasix 30 tablet 0   PRESCRIPTION MEDICATION Apply 1 application topically 2 (two) times a week. Dexameth Na Ph 0.4%     rosuvastatin (CRESTOR) 40 MG tablet TAKE 1 TABLET BY MOUTH ONCE DAILY. 90 tablet 3   urea (CARMOL) 40 % CREA Apply 1 application topically daily. 120 g 2   No current facility-administered medications on file prior to visit.   Allergies  Allergen Reactions   Levofloxacin Anaphylaxis and Rash   Cardizem [Diltiazem] Other (See Comments)    Patient states that she could not think, felt that she was in the twilight zone. Arm discomfort.   Sulfa Antibiotics Itching   Diazepam Nausea Only   Lipitor [Atorvastatin] Other (See Comments)    Caused her body to be "out of whack"  - elevated sugar also.   Family History  Problem Relation Age of Onset   Stroke Father    Diabetes Mother    Diabetes Sister    Colon cancer Neg Hx    PE: There were no vitals taken for this visit. Wt Readings from Last 3 Encounters:  03/23/22 140 lb (63.5 kg)  11/17/21 139 lb (63 kg)  10/25/21 141 lb 3.2 oz (64 kg)   Constitutional: normal weight, in NAD Eyes: PERRLA, EOMI, no  exophthalmos ENT: moist mucous membranes, no thyromegaly, no cervical lymphadenopathy Cardiovascular: RRR, No MRG Respiratory: CTA B Musculoskeletal: no deformities Skin: moist, warm, no rashes Neurological: no tremor with outstretched hands  ASSESSMENT: 1. DM2, non-insulin-dependent, uncontrolled, with complications - CAD, s/p CABG 2021 - cardiologist: Dr. Harrington Challenger - PN  2. HL  PLAN:  1. Patient with longstanding, uncontrolled, type 2 diabetes, on oral antidiabetic regimen with metformin, sulfonylurea, and SGLT2 inhibitor, with worsening control.  At last visit, HbA1c was 7.0%, but 4 days ago she had another HbA1c which was higher, at 7.8%. -At last visit, she was only checking blood sugars in the morning and they were usually at goal.  She only had 1 low blood sugar in the 60s after taking glipizide at bedtime rather than in the morning.  We discussed about not taking it at bedtime even if she was forgetting to take it in the morning.  Wilder Glade was expensive for her and I gave her paperwork to see if she could obtain it from the patient assistance program.  Otherwise, I advised her to continue to stay active.  She was exercising at the gym, walking her dog twice a day and working part-time.  - I suggested to:  Patient Instructions  Please use the following regimen: - Metformin ER 500 mg 2x a day - Glipizide ER 2.5 mg before breakfast - [place] Glade  10 mg before breakfast  Please return in 4 months with your sugar log.   - advised to check sugars at different times of the day - 1x a day, rotating check times - advised for yearly eye exams >> she is UTD - return to clinic in 4 months  2. HL -Reviewed lipid panel from 4 days ago: All fractions at goal: Lab Results  Component Value Date   CHOL 112 03/23/2022   HDL 48 03/23/2022   LDLCALC 36 03/23/2022   TRIG 138 03/23/2022   CHOLHDL 2.3 03/23/2022  -She is on Crestor 40 mg daily, Zetia 10 mg daily without side effects  Philemon Kingdom, MD PhD [facility] Endocrinology

## 2022-04-03 DIAGNOSIS — S233XXA Sprain of ligaments of thoracic spine, initial encounter: Secondary | ICD-10-CM | POA: Diagnosis not present

## 2022-04-03 DIAGNOSIS — M546 Pain in thoracic spine: Secondary | ICD-10-CM | POA: Diagnosis not present

## 2022-04-03 DIAGNOSIS — M9901 Segmental and somatic dysfunction of cervical region: Secondary | ICD-10-CM | POA: Diagnosis not present

## 2022-04-03 DIAGNOSIS — M47816 Spondylosis without myelopathy or radiculopathy, lumbar region: Secondary | ICD-10-CM | POA: Diagnosis not present

## 2022-04-03 DIAGNOSIS — S338XXA Sprain of other parts of lumbar spine and pelvis, initial encounter: Secondary | ICD-10-CM | POA: Diagnosis not present

## 2022-04-03 DIAGNOSIS — M9903 Segmental and somatic dysfunction of lumbar region: Secondary | ICD-10-CM | POA: Diagnosis not present

## 2022-04-03 DIAGNOSIS — M47812 Spondylosis without myelopathy or radiculopathy, cervical region: Secondary | ICD-10-CM | POA: Diagnosis not present

## 2022-04-03 DIAGNOSIS — M9902 Segmental and somatic dysfunction of thoracic region: Secondary | ICD-10-CM | POA: Diagnosis not present

## 2022-04-05 DIAGNOSIS — I25119 Atherosclerotic heart disease of native coronary artery with unspecified angina pectoris: Secondary | ICD-10-CM | POA: Diagnosis not present

## 2022-04-05 DIAGNOSIS — E1165 Type 2 diabetes mellitus with hyperglycemia: Secondary | ICD-10-CM | POA: Diagnosis not present

## 2022-04-05 DIAGNOSIS — I10 Essential (primary) hypertension: Secondary | ICD-10-CM | POA: Diagnosis not present

## 2022-04-05 DIAGNOSIS — Z299 Encounter for prophylactic measures, unspecified: Secondary | ICD-10-CM | POA: Diagnosis not present

## 2022-04-05 DIAGNOSIS — I4891 Unspecified atrial fibrillation: Secondary | ICD-10-CM | POA: Diagnosis not present

## 2022-04-17 DIAGNOSIS — S338XXA Sprain of other parts of lumbar spine and pelvis, initial encounter: Secondary | ICD-10-CM | POA: Diagnosis not present

## 2022-04-17 DIAGNOSIS — M9902 Segmental and somatic dysfunction of thoracic region: Secondary | ICD-10-CM | POA: Diagnosis not present

## 2022-04-17 DIAGNOSIS — E1165 Type 2 diabetes mellitus with hyperglycemia: Secondary | ICD-10-CM | POA: Diagnosis not present

## 2022-04-17 DIAGNOSIS — M9901 Segmental and somatic dysfunction of cervical region: Secondary | ICD-10-CM | POA: Diagnosis not present

## 2022-04-17 DIAGNOSIS — M47816 Spondylosis without myelopathy or radiculopathy, lumbar region: Secondary | ICD-10-CM | POA: Diagnosis not present

## 2022-04-17 DIAGNOSIS — M546 Pain in thoracic spine: Secondary | ICD-10-CM | POA: Diagnosis not present

## 2022-04-17 DIAGNOSIS — M47812 Spondylosis without myelopathy or radiculopathy, cervical region: Secondary | ICD-10-CM | POA: Diagnosis not present

## 2022-04-17 DIAGNOSIS — E119 Type 2 diabetes mellitus without complications: Secondary | ICD-10-CM | POA: Diagnosis not present

## 2022-04-17 DIAGNOSIS — I700 Atherosclerosis of aorta: Secondary | ICD-10-CM | POA: Diagnosis not present

## 2022-04-17 DIAGNOSIS — I10 Essential (primary) hypertension: Secondary | ICD-10-CM | POA: Diagnosis not present

## 2022-04-17 DIAGNOSIS — S233XXA Sprain of ligaments of thoracic spine, initial encounter: Secondary | ICD-10-CM | POA: Diagnosis not present

## 2022-04-17 DIAGNOSIS — Z299 Encounter for prophylactic measures, unspecified: Secondary | ICD-10-CM | POA: Diagnosis not present

## 2022-04-17 DIAGNOSIS — I4891 Unspecified atrial fibrillation: Secondary | ICD-10-CM | POA: Diagnosis not present

## 2022-04-17 DIAGNOSIS — M9903 Segmental and somatic dysfunction of lumbar region: Secondary | ICD-10-CM | POA: Diagnosis not present

## 2022-04-17 DIAGNOSIS — E538 Deficiency of other specified B group vitamins: Secondary | ICD-10-CM | POA: Diagnosis not present

## 2022-04-19 DIAGNOSIS — Z299 Encounter for prophylactic measures, unspecified: Secondary | ICD-10-CM | POA: Diagnosis not present

## 2022-04-19 DIAGNOSIS — Z0000 Encounter for general adult medical examination without abnormal findings: Secondary | ICD-10-CM | POA: Diagnosis not present

## 2022-04-19 DIAGNOSIS — Z79899 Other long term (current) drug therapy: Secondary | ICD-10-CM | POA: Diagnosis not present

## 2022-04-19 DIAGNOSIS — E7800 Pure hypercholesterolemia, unspecified: Secondary | ICD-10-CM | POA: Diagnosis not present

## 2022-04-19 DIAGNOSIS — I10 Essential (primary) hypertension: Secondary | ICD-10-CM | POA: Diagnosis not present

## 2022-04-19 DIAGNOSIS — Z789 Other specified health status: Secondary | ICD-10-CM | POA: Diagnosis not present

## 2022-04-19 DIAGNOSIS — R5383 Other fatigue: Secondary | ICD-10-CM | POA: Diagnosis not present

## 2022-04-23 DIAGNOSIS — M810 Age-related osteoporosis without current pathological fracture: Secondary | ICD-10-CM | POA: Diagnosis not present

## 2022-04-23 DIAGNOSIS — Z299 Encounter for prophylactic measures, unspecified: Secondary | ICD-10-CM | POA: Diagnosis not present

## 2022-04-23 DIAGNOSIS — I4891 Unspecified atrial fibrillation: Secondary | ICD-10-CM | POA: Diagnosis not present

## 2022-04-23 DIAGNOSIS — I10 Essential (primary) hypertension: Secondary | ICD-10-CM | POA: Diagnosis not present

## 2022-04-23 DIAGNOSIS — E876 Hypokalemia: Secondary | ICD-10-CM | POA: Diagnosis not present

## 2022-04-25 ENCOUNTER — Encounter: Payer: Self-pay | Admitting: Internal Medicine

## 2022-04-25 ENCOUNTER — Ambulatory Visit (INDEPENDENT_AMBULATORY_CARE_PROVIDER_SITE_OTHER): Payer: Medicare Other | Admitting: Internal Medicine

## 2022-04-25 ENCOUNTER — Other Ambulatory Visit: Payer: Self-pay | Admitting: Internal Medicine

## 2022-04-25 VITALS — BP 128/72 | HR 63 | Ht 63.0 in | Wt 144.2 lb

## 2022-04-25 DIAGNOSIS — E1165 Type 2 diabetes mellitus with hyperglycemia: Secondary | ICD-10-CM

## 2022-04-25 DIAGNOSIS — E1159 Type 2 diabetes mellitus with other circulatory complications: Secondary | ICD-10-CM

## 2022-04-25 DIAGNOSIS — E785 Hyperlipidemia, unspecified: Secondary | ICD-10-CM | POA: Diagnosis not present

## 2022-04-25 DIAGNOSIS — Z1231 Encounter for screening mammogram for malignant neoplasm of breast: Secondary | ICD-10-CM

## 2022-04-25 NOTE — Patient Instructions (Addendum)
Please use the following regimen: - Metformin ER 500 mg 2x a day - Glipizide ER 2.5 mg before breakfast  Restart the insulin at 7 units at night. (Let me know the name when you get home)  Start Jardiance 25 mg daily in am.   The most common suppliers for the continuous glucose monitor are:  [place] [employer]: [place]: [phone] Ext [place]: [place] [employer]: [place]: [phone] [place]: [phone]   Call them and let me know where to send the prescription to.  Fill out the paperwork for Jardiance and bring it back.  Please schedule an appt with Antonieta Iba with nutrition.  Please return in 3-4 months with your sugar log.

## 2022-04-25 NOTE — Progress Notes (Signed)
Patient ID: Katrina Castro, female   DOB: [birth year], 76 y.o.   MRN: [id number]  HPI: Katrina Castro is a 76 y.o.-year-old female, returning for follow-up for DM2, dx in 2015, non-insulin-dependent, uncontrolled, with complications (CAD, s/p CABG; PN). Pt. previously saw Dr. Loanne Drilling.  Last visit with me 5 months ago.  Interim history: + increased urination, but no blurry vision, nausea, chest pain. She fell in her bathtub 1 week ago (mat slid under her) >> no fractures. She had a sore throat 1 week ago >> took ABx by herself. Towards the end of the appointment, patient mentions that she was started on an injectable medicine (she does not remember the name and does not know whether this is insulin or not) by PCP - 10 units.  She used it 1x only 2/2 diarrhea.  Reviewed HbA1c: Lab Results  Component Value Date   HGBA1C 7.8 (H) 03/23/2022   HGBA1C 7.0 (A) 11/17/2021   HGBA1C 7.3 (A) 09/20/2021   HGBA1C 8.0 (A) 07/26/2021   HGBA1C 6.4 (H) 05/12/2020   HGBA1C 7.5 (H) 04/25/2020   HGBA1C 7.5 (H) 04/25/2020   Pt is on a regimen of: - Metformin ER 500 mg 2x daily - Glipizide ER 2.5 mg before breakfast -  -151 $ per mo - she tried the PAP but she did not qualify >> now off - Insulin (?) 10 units started by PCP last week. Took it once. She tried Rybelsus >> nausea, but retrospectively, this was 2/2 Esophageal pbs.  Pt checks her sugars 0-1x a day and they are: - am: 62, 108-114, 160 (watermelon) >> 120 (insulin), 130-200 - 2h after b'fast: n/c - before lunch: n/c - 2h after lunch: n/c - before dinner: n/c - 2h after dinner: n/c - bedtime: n/c - nighttime: n/c Lowest sugar was 62 (took Glipizide at bedtime as she forgot it in am) >> 120; she has hypoglycemia awareness at 70.  Highest sugar was 160 >> 240.  Glucometer:Prodigy  - no CKD, last BUN/creatinine:  Lab Results  Component Value Date   BUN 12 03/23/2022   BUN 15 07/21/2021   CREATININE 0.76 03/23/2022   CREATININE  0.76 07/21/2021  She is not on ACE inhibitor or ARB.  -+ HL; last set of lipids: Lab Results  Component Value Date   CHOL 112 03/23/2022   HDL 48 03/23/2022   LDLCALC 36 03/23/2022   TRIG 138 03/23/2022   CHOLHDL 2.3 03/23/2022  On Crestor 40, Zetia 10.  - last eye exam was in 09/2021. No DR - reportedly.   - no numbness and tingling in her feet.  She has a history of left foot surgery.  Last foot exam 11/2021.  She also has a history of atrial fibrillation, HTN, GERD, depression. She is on B12 supplements.  She goes to the gym - treadmill.  She was abandoned by her mother in a hotel when she was 21 weeks old. She is working part time - caregiver for cancer patient.  She used to drive a truck for many years.  ROS: + see HPI  Past Medical History:  Diagnosis Date   Arthritis    CAD (coronary artery disease)    a. s/p CABG on 04/27/2020 with LIMA-LAD, seq SVG-PDA-PL, seq left radial-RI-OM   Concussion    2015   Depression    Diabetes ([place])    type 2   Dysphagia    Dysrhythmia    afib   GERD (gastroesophageal reflux disease)  Headache    History of bronchitis    Hypertension    Seasonal allergies    Toenail fungus    Past Surgical History:  Procedure Laterality Date   APPENDECTOMY     BACK SURGERY     x2   BOTOX INJECTION N/A 06/06/2016   Procedure: BOTOX INJECTION;  Surgeon: Rogene Houston, MD;  [facility];  Service: Endoscopy;  Laterality: N/A;   BOTOX INJECTION N/A 01/17/2021   Procedure: BOTOX INJECTION;  Surgeon: Harvel Quale, MD;  [facility];  Service: Gastroenterology;  Laterality: N/A;   BOTOX INJECTION N/A 07/11/2021   Procedure: BOTOX INJECTION;  Surgeon: Harvel Quale, MD;  [facility];  Service: Gastroenterology;  Laterality: N/A;   CHOLECYSTECTOMY     COLONOSCOPY N/A 11/05/2012   Procedure: COLONOSCOPY;  Surgeon: Rogene Houston, MD;  [facility];  Service: Endoscopy;   Laterality: N/A;  1030   COLONOSCOPY WITH PROPOFOL N/A 05/01/2019   Procedure: COLONOSCOPY WITH PROPOFOL;  Surgeon: Rogene Houston, MD;  [facility];  Service: Endoscopy;  Laterality: N/A;  11:20am   CORONARY ARTERY BYPASS GRAFT N/A 04/27/2020   Procedure: CORONARY ARTERY BYPASS GRAFTING (CABG) TIMES FIVE USING LEFT INTERNAL MAMMARY ARTERY, LEFT HARVESTED RADIAL ARTERY, RIGHT GREATER SAPHENOUS VEIN HARVESTED ENDOSCOPICALLY.;  Surgeon: [name], MD;  Location: [place];  Service: Open Heart Surgery;  Laterality: N/A;   ESOPHAGEAL DILATION N/A 07/06/2015   Procedure: ESOPHAGEAL DILATION;  Surgeon: Rogene Houston, MD;  [facility];  Service: Endoscopy;  Laterality: N/A;   ESOPHAGEAL DILATION N/A 06/06/2016   Procedure: ESOPHAGEAL DILATION;  Surgeon: Rogene Houston, MD;  [facility];  Service: Endoscopy;  Laterality: N/A;   ESOPHAGOGASTRODUODENOSCOPY N/A 02/12/2014   Procedure: ESOPHAGOGASTRODUODENOSCOPY (EGD);  Surgeon: Rogene Houston, MD;  [facility];  Service: Endoscopy;  Laterality: N/A;  150   ESOPHAGOGASTRODUODENOSCOPY N/A 07/06/2015   Procedure: ESOPHAGOGASTRODUODENOSCOPY (EGD);  Surgeon: Rogene Houston, MD;  [facility];  Service: Endoscopy;  Laterality: N/A;  1:25 - moved to 1/18 @ 10:30 - Ann to notify pt   ESOPHAGOGASTRODUODENOSCOPY (EGD) WITH ESOPHAGEAL DILATION N/A 07/25/2012   Procedure: ESOPHAGOGASTRODUODENOSCOPY (EGD) WITH ESOPHAGEAL DILATION;  Surgeon: Rogene Houston, MD;  [facility];  Service: Endoscopy;  Laterality: N/A;  325-rescheduled to [place] notified pt   ESOPHAGOGASTRODUODENOSCOPY (EGD) WITH PROPOFOL N/A 06/06/2016   Procedure: ESOPHAGOGASTRODUODENOSCOPY (EGD) WITH PROPOFOL;  Surgeon: Rogene Houston, MD;  [facility];  Service: Endoscopy;  Laterality: N/A;   ESOPHAGOGASTRODUODENOSCOPY (EGD) WITH PROPOFOL N/A 01/17/2021   Procedure: ESOPHAGOGASTRODUODENOSCOPY (EGD) WITH PROPOFOL;   Surgeon: Harvel Quale, MD;  [facility];  Service: Gastroenterology;  Laterality: N/A;  10:35   ESOPHAGOGASTRODUODENOSCOPY (EGD) WITH PROPOFOL N/A 07/11/2021   Procedure: ESOPHAGOGASTRODUODENOSCOPY (EGD) WITH PROPOFOL;  Surgeon: Harvel Quale, MD;  [facility];  Service: Gastroenterology;  Laterality: N/A;  11:15   EYE SURGERY     cataracts removed   Foot surgeries Bilateral    hammer toes   LEFT HEART CATH AND CORONARY ANGIOGRAPHY N/A 04/26/2020   Procedure: LEFT HEART CATH AND CORONARY ANGIOGRAPHY;  Surgeon: Martinique, Peter M, MD;  Location: [facility];  Service: Cardiovascular;  Laterality: N/A;   MALONEY DILATION N/A 02/12/2014   Procedure: Venia Minks DILATION;  Surgeon: Rogene Houston, MD;  [facility];  Service: Endoscopy;  Laterality: N/A;  POLYPECTOMY  05/01/2019   Procedure: POLYPECTOMY;  Surgeon: Rogene Houston, MD;  [facility];  Service: Endoscopy;;  colon    RADIAL ARTERY HARVEST Left 04/27/2020   Procedure: LEFT RADIAL ARTERY HARVEST;  Surgeon: [name], MD;  Location: [place];  Service: Open Heart Surgery;  Laterality: Left;   Right knee arthroscopy     x2   TEE WITHOUT CARDIOVERSION N/A 04/27/2020   Procedure: TRANSESOPHAGEAL ECHOCARDIOGRAM (TEE);  Surgeon: [name], MD;  Location: [facility];  Service: Open Heart Surgery;  Laterality: N/A;   TONSILLECTOMY     TOTAL ABDOMINAL HYSTERECTOMY     TOTAL KNEE ARTHROPLASTY Right 04/03/2017   Procedure: RIGHT TOTAL KNEE ARTHROPLASTY;  Surgeon: Latanya Maudlin, MD;  Location: [facility];  Service: Orthopedics;  Laterality: Right;   Social History   Socioeconomic History   Marital status: Divorced    Spouse name: Not on file   Number of children: Not on file   Years of education: Not on file   Highest education level: Not on file  Occupational History   Not on file  Tobacco Use   Smoking status: Never   Smokeless tobacco: Never  Vaping Use    Vaping Use: Never used  Substance and Sexual Activity   Alcohol use: No    Alcohol/week: 0.0 standard drinks of alcohol    Comment: socially    Drug use: No   Sexual activity: Not Currently    Partners: Male    Birth control/protection: None  Other Topics Concern   Not on file  Social History Narrative   Not on file   Social Determinants of Health   Financial Resource Strain: Not on file  Food Insecurity: Not on file  Transportation Needs: Not on file  Physical Activity: Not on file  Stress: Not on file  Social Connections: Not on file  Intimate Partner Violence: Not on file   Current Outpatient Medications on File Prior to Visit  Medication Sig Dispense Refill   acetaminophen (TYLENOL) 650 MG CR tablet Take 650 mg by mouth every 8 (eight) hours as needed for pain.     acyclovir (ZOVIRAX) 400 MG tablet Take 400 mg by mouth daily.     ALPRAZolam (XANAX) 0.5 MG tablet Take 0.25 mg by mouth at bedtime as needed for anxiety or sleep.     aspirin 81 MG EC tablet Take 1 tablet (81 mg total) by mouth daily. Swallow whole. 30 tablet 11   Biotin w/ Vitamins C & E (HAIR SKIN & NAILS GUMMIES PO) Take 2 capsules by mouth daily.     Cholecalciferol (VITAMIN D) 50 MCG (2000 UT) tablet Take 2,000 Units by mouth daily.     clotrimazole (LOTRIMIN) 1 % cream Apply 1 application topically 2 (two) times daily as needed (lip cracking).     clotrimazole-betamethasone (LOTRISONE) cream Apply 1 application topically 2 (two) times daily. 30 g 0   Cyanocobalamin (B-12 COMPLIANCE INJECTION IJ) Inject 1 Dose as directed every 30 (thirty) days.     Cyanocobalamin (B-12 PO) Take 1 tablet by mouth daily.     dapagliflozin propanediol (FARXIGA) 10 MG TABS tablet Take 1 tablet (10 mg total) by mouth daily before breakfast. 90 tablet 3   diclofenac Sodium (VOLTAREN) 1 % GEL Apply 1 application topically 4 (four) times daily as needed (pain).     dicyclomine (BENTYL) 10 MG capsule Take 1 capsule (10 mg total)  by mouth every 12 (twelve) hours as needed for  spasms (abdominal pain). 30 capsule 0   ezetimibe (ZETIA) 10 MG tablet TAKE 1 TABLET ONCE DAILY. 90 tablet 3   furosemide (LASIX) 40 MG tablet Take 40 mg every THIRD day until your weight is back to baseline 30 tablet 0   glipiZIDE (GLUCOTROL XL) 2.5 MG 24 hr tablet Take 1 tablet (2.5 mg total) by mouth daily with breakfast. 90 tablet 3   levocetirizine (XYZAL) 5 MG tablet Take 5 mg by mouth daily.     Menthol-Methyl Salicylate (MUSCLE RUB EX) Apply 1 application topically daily as needed (muscle pain). [place] gel     metFORMIN (GLUCOPHAGE-XR) 500 MG 24 hr tablet Take 1 tablet (500 mg total) by mouth daily with breakfast. 90 tablet 3   metoprolol tartrate (LOPRESSOR) 25 MG tablet TAKE (1/2) TABLET BY MOUTH 2 TIMES A DAY. 30 tablet 6   Multiple Vitamins-Minerals (ADULT GUMMY PO) Take 2 capsules by mouth daily.     nitroGLYCERIN (NITROSTAT) 0.4 MG SL tablet Place 1 tablet (0.4 mg total) under the tongue every 5 (five) minutes as needed for chest pain. 25 tablet 3   oxybutynin (DITROPAN-XL) 10 MG 24 hr tablet Take 10 mg by mouth daily.     pantoprazole (PROTONIX) 40 MG tablet Take 40 mg by mouth daily.     potassium chloride (KLOR-CON) 10 MEQ tablet Take 10 meq every THIRD day until weight is back to baseline. Take with Lasix 30 tablet 0   PRESCRIPTION MEDICATION Apply 1 application topically 2 (two) times a week. Dexameth Na Ph 0.4%     rosuvastatin (CRESTOR) 40 MG tablet TAKE 1 TABLET BY MOUTH ONCE DAILY. 90 tablet 3   urea (CARMOL) 40 % CREA Apply 1 application topically daily. 120 g 2   No current facility-administered medications on file prior to visit.   Allergies  Allergen Reactions   Levofloxacin Anaphylaxis and Rash   Cardizem [Diltiazem] Other (See Comments)    Patient states that she could not think, felt that she was in the twilight zone. Arm discomfort.   Sulfa Antibiotics Itching   Diazepam Nausea Only   Lipitor [Atorvastatin] Other  (See Comments)    Caused her body to be "out of whack"  - elevated sugar also.   Family History  Problem Relation Age of Onset   Stroke Father    Diabetes Mother    Diabetes Sister    Colon cancer Neg Hx    PE: BP 128/72 (BP Location: Right Arm, Patient Position: Sitting, Cuff Size: Normal)   Pulse 63   Ht '5\' 3"'$  (1.6 m)   Wt 144 lb 3.2 oz (65.4 kg)   SpO2 99%   BMI 25.54 kg/m  Wt Readings from Last 3 Encounters:  04/25/22 144 lb 3.2 oz (65.4 kg)  03/23/22 140 lb (63.5 kg)  11/17/21 139 lb (63 kg)   Constitutional: normal weight, in NAD Eyes: EOMI, no exophthalmos ENT: no thyromegaly, no cervical lymphadenopathy Cardiovascular: RRR, No MRG Respiratory: CTA B Musculoskeletal: no deformities Skin: moist, warm, no rashes Neurological: no tremor with outstretched hands  ASSESSMENT: 1. DM2, non-insulin-dependent, uncontrolled, with complications - CAD, s/p CABG 2021 - cardiologist: Dr. Harrington Challenger - PN  2. HL  PLAN:  1. Patient with longstanding, uncontrolled, type 2 diabetes, on oral antidiabetic regimen with metformin, sulfonylurea, and SGLT2 inhibitor, with worsening control.  At our last visit HbA1c was 7.0%, but she had another HbA1c last month and this was higher, at 7.8%. -At last visit, she was only checking blood sugars  in the morning and they were usually at goal.  She had 1 low blood sugar in the 60s after taking glipizide at bedtime rather than in the morning.  We discussed about not taking it at bedtime even if she was forgetting to take it in the morning.  Wilder Glade was expensive for her and I gave her paperwork to see if she could obtain this from the patient assistance program.  Otherwise, I advised her to stay active.  She was exercising at the gym, walking her dog twice a day and working part-time. -At today's visit, sugars are much higher.  She only checks in the morning and they are quite fluctuating.  She ran out of [place] and did not qualify for the patient  assistance program.  At today's visit we gave her samples of Jardiance for 2 weeks and also the paperwork for the Jardiance patient assistance program.  I advised her to bring it back so we can fax it to the company.  -I am not sure exactly what medications she started last week, but it appears that it was insulin.  I was not aware of this.  I advised her to look at home and let me know.  I did advise her to start back on this at a lower dose.  -She is interested in a CGM.  I gave her a list of the common suppliers and advised her to let me know which one she establishes an account with.  We discussed that Medicare is reticent to cover those if patients are not on insulin, but since she started insulin, she may get this covered. -She admits to eating poorly.  She would be interested in a referral to nutrition.  Referral placed today. - I suggested to:  Patient Instructions  Please use the following regimen: - Metformin ER 500 mg 2x a day - Glipizide ER 2.5 mg before breakfast  Restart the insulin at 7 units at night. (Let me know the name when you get home)  Start Jardiance 25 mg daily in am.   The most common suppliers for the continuous glucose monitor are:  [place] [employer]: [place]: [phone] Ext [place]: [place]: [place]: [phone] [place]: [phone]   Call them and let me know where to send the prescription to.  Fill out the paperwork for Jardiance and bring it back.  Please schedule an appt with Antonieta Iba with nutrition.  Please return in 3-4 months with your sugar log.   - advised to check sugars at different times of the day - 1x a day, rotating check times - advised for yearly eye exams >> she is UTD - return to clinic in 4 months  2. HL -Reviewed lipid panel from 4 days ago: All fractions at goal: Lab Results  Component Value Date   CHOL 112 03/23/2022    HDL 48 03/23/2022   LDLCALC 36 03/23/2022   TRIG 138 03/23/2022   CHOLHDL 2.3 03/23/2022  -She is on Crestor 40 mg daily and Zetia 10 mg daily without side effects  Philemon Kingdom, MD PhD [facility]

## 2022-05-01 DIAGNOSIS — M9901 Segmental and somatic dysfunction of cervical region: Secondary | ICD-10-CM | POA: Diagnosis not present

## 2022-05-01 DIAGNOSIS — M47812 Spondylosis without myelopathy or radiculopathy, cervical region: Secondary | ICD-10-CM | POA: Diagnosis not present

## 2022-05-01 DIAGNOSIS — M9903 Segmental and somatic dysfunction of lumbar region: Secondary | ICD-10-CM | POA: Diagnosis not present

## 2022-05-01 DIAGNOSIS — M47816 Spondylosis without myelopathy or radiculopathy, lumbar region: Secondary | ICD-10-CM | POA: Diagnosis not present

## 2022-05-01 DIAGNOSIS — S233XXA Sprain of ligaments of thoracic spine, initial encounter: Secondary | ICD-10-CM | POA: Diagnosis not present

## 2022-05-01 DIAGNOSIS — M9902 Segmental and somatic dysfunction of thoracic region: Secondary | ICD-10-CM | POA: Diagnosis not present

## 2022-05-01 DIAGNOSIS — M546 Pain in thoracic spine: Secondary | ICD-10-CM | POA: Diagnosis not present

## 2022-05-01 DIAGNOSIS — S338XXA Sprain of other parts of lumbar spine and pelvis, initial encounter: Secondary | ICD-10-CM | POA: Diagnosis not present

## 2022-05-04 DIAGNOSIS — I10 Essential (primary) hypertension: Secondary | ICD-10-CM | POA: Diagnosis not present

## 2022-05-04 DIAGNOSIS — Z299 Encounter for prophylactic measures, unspecified: Secondary | ICD-10-CM | POA: Diagnosis not present

## 2022-05-04 DIAGNOSIS — M25561 Pain in right knee: Secondary | ICD-10-CM | POA: Diagnosis not present

## 2022-05-09 ENCOUNTER — Ambulatory Visit
Admission: RE | Admit: 2022-05-09 | Discharge: 2022-05-09 | Disposition: A | Discharge status: Home / Self Care | Payer: Medicare Other | Source: Ambulatory Visit | Attending: Internal Medicine | Admitting: Internal Medicine

## 2022-05-09 DIAGNOSIS — E538 Deficiency of other specified B group vitamins: Secondary | ICD-10-CM | POA: Diagnosis not present

## 2022-05-09 DIAGNOSIS — Z1231 Encounter for screening mammogram for malignant neoplasm of breast: Secondary | ICD-10-CM | POA: Diagnosis not present

## 2022-05-14 DIAGNOSIS — E1165 Type 2 diabetes mellitus with hyperglycemia: Secondary | ICD-10-CM | POA: Diagnosis not present

## 2022-05-14 DIAGNOSIS — Z299 Encounter for prophylactic measures, unspecified: Secondary | ICD-10-CM | POA: Diagnosis not present

## 2022-05-14 DIAGNOSIS — I10 Essential (primary) hypertension: Secondary | ICD-10-CM | POA: Diagnosis not present

## 2022-05-14 DIAGNOSIS — J40 Bronchitis, not specified as acute or chronic: Secondary | ICD-10-CM | POA: Diagnosis not present

## 2022-05-15 ENCOUNTER — Other Ambulatory Visit: Payer: Self-pay | Admitting: Internal Medicine

## 2022-05-15 ENCOUNTER — Telehealth: Payer: Self-pay | Admitting: Internal Medicine

## 2022-05-15 DIAGNOSIS — R928 Other abnormal and inconclusive findings on diagnostic imaging of breast: Secondary | ICD-10-CM

## 2022-05-15 NOTE — Telephone Encounter (Signed)
Patient is calling to state that she called the number that she was given about the continuous glucose monitor and she states that a prescription needs to be sent in to [email or web address] or the number to call is [phone].  (Patient did not remember the name of the company that the email and phone number belong to).

## 2022-05-17 DIAGNOSIS — E119 Type 2 diabetes mellitus without complications: Secondary | ICD-10-CM | POA: Diagnosis not present

## 2022-05-17 DIAGNOSIS — I10 Essential (primary) hypertension: Secondary | ICD-10-CM | POA: Diagnosis not present

## 2022-05-18 NOTE — Telephone Encounter (Signed)
Unable to connect with anyone at the number provided. Pt advised.

## 2022-05-19 ENCOUNTER — Other Ambulatory Visit: Payer: Medicare Other

## 2022-05-21 ENCOUNTER — Telehealth: Payer: Self-pay

## 2022-05-21 DIAGNOSIS — E119 Type 2 diabetes mellitus without complications: Secondary | ICD-10-CM | POA: Diagnosis not present

## 2022-05-21 DIAGNOSIS — I10 Essential (primary) hypertension: Secondary | ICD-10-CM | POA: Diagnosis not present

## 2022-05-21 NOTE — Telephone Encounter (Signed)
It looks like she only needs to take it in the morning, once a day -Per review of the last note.

## 2022-05-21 NOTE — Telephone Encounter (Signed)
Pt called to request rx for Glipizide that was sent to [facility] reads take once daily and last she was advised to take 2X daily.

## 2022-05-22 ENCOUNTER — Other Ambulatory Visit: Payer: Self-pay

## 2022-05-22 DIAGNOSIS — E1165 Type 2 diabetes mellitus with hyperglycemia: Secondary | ICD-10-CM

## 2022-05-22 MED ORDER — EMPAGLIFLOZIN 25 MG PO TABS: 25.0000 mg | ORAL_TABLET | Freq: Every day | ORAL | 1 refills | Status: DC

## 2022-05-23 DIAGNOSIS — R92321 Mammographic fibroglandular density, right breast: Secondary | ICD-10-CM | POA: Diagnosis not present

## 2022-05-23 DIAGNOSIS — R928 Other abnormal and inconclusive findings on diagnostic imaging of breast: Secondary | ICD-10-CM | POA: Diagnosis not present

## 2022-05-25 ENCOUNTER — Telehealth: Payer: Self-pay

## 2022-05-25 NOTE — Telephone Encounter (Signed)
Inbound fax requesting form and clinical notes be faxed. Form completed and faxed with clinical notes.

## 2022-05-28 NOTE — Telephone Encounter (Signed)
Lvm for pt confirming rx is correct and she should be taking 1 tablet daily of Glipizide. Pt advised to contact office with any further questions.

## 2022-05-29 DIAGNOSIS — M6281 Muscle weakness (generalized): Secondary | ICD-10-CM | POA: Diagnosis not present

## 2022-05-29 DIAGNOSIS — M25561 Pain in right knee: Secondary | ICD-10-CM | POA: Diagnosis not present

## 2022-06-01 DIAGNOSIS — R059 Cough, unspecified: Secondary | ICD-10-CM | POA: Diagnosis not present

## 2022-06-01 DIAGNOSIS — Z299 Encounter for prophylactic measures, unspecified: Secondary | ICD-10-CM | POA: Diagnosis not present

## 2022-06-01 DIAGNOSIS — R0981 Nasal congestion: Secondary | ICD-10-CM | POA: Diagnosis not present

## 2022-06-01 DIAGNOSIS — J209 Acute bronchitis, unspecified: Secondary | ICD-10-CM | POA: Diagnosis not present

## 2022-06-05 IMAGING — CR DG CHEST 2V
2 series · 2 of 2 positions shown · non-contrast
Comparison: April 30, 2020

CLINICAL DATA: Status post recent coronary artery bypass grafting.
Atelectatic change.

EXAM:
CHEST - 2 VIEW

[chest pa]
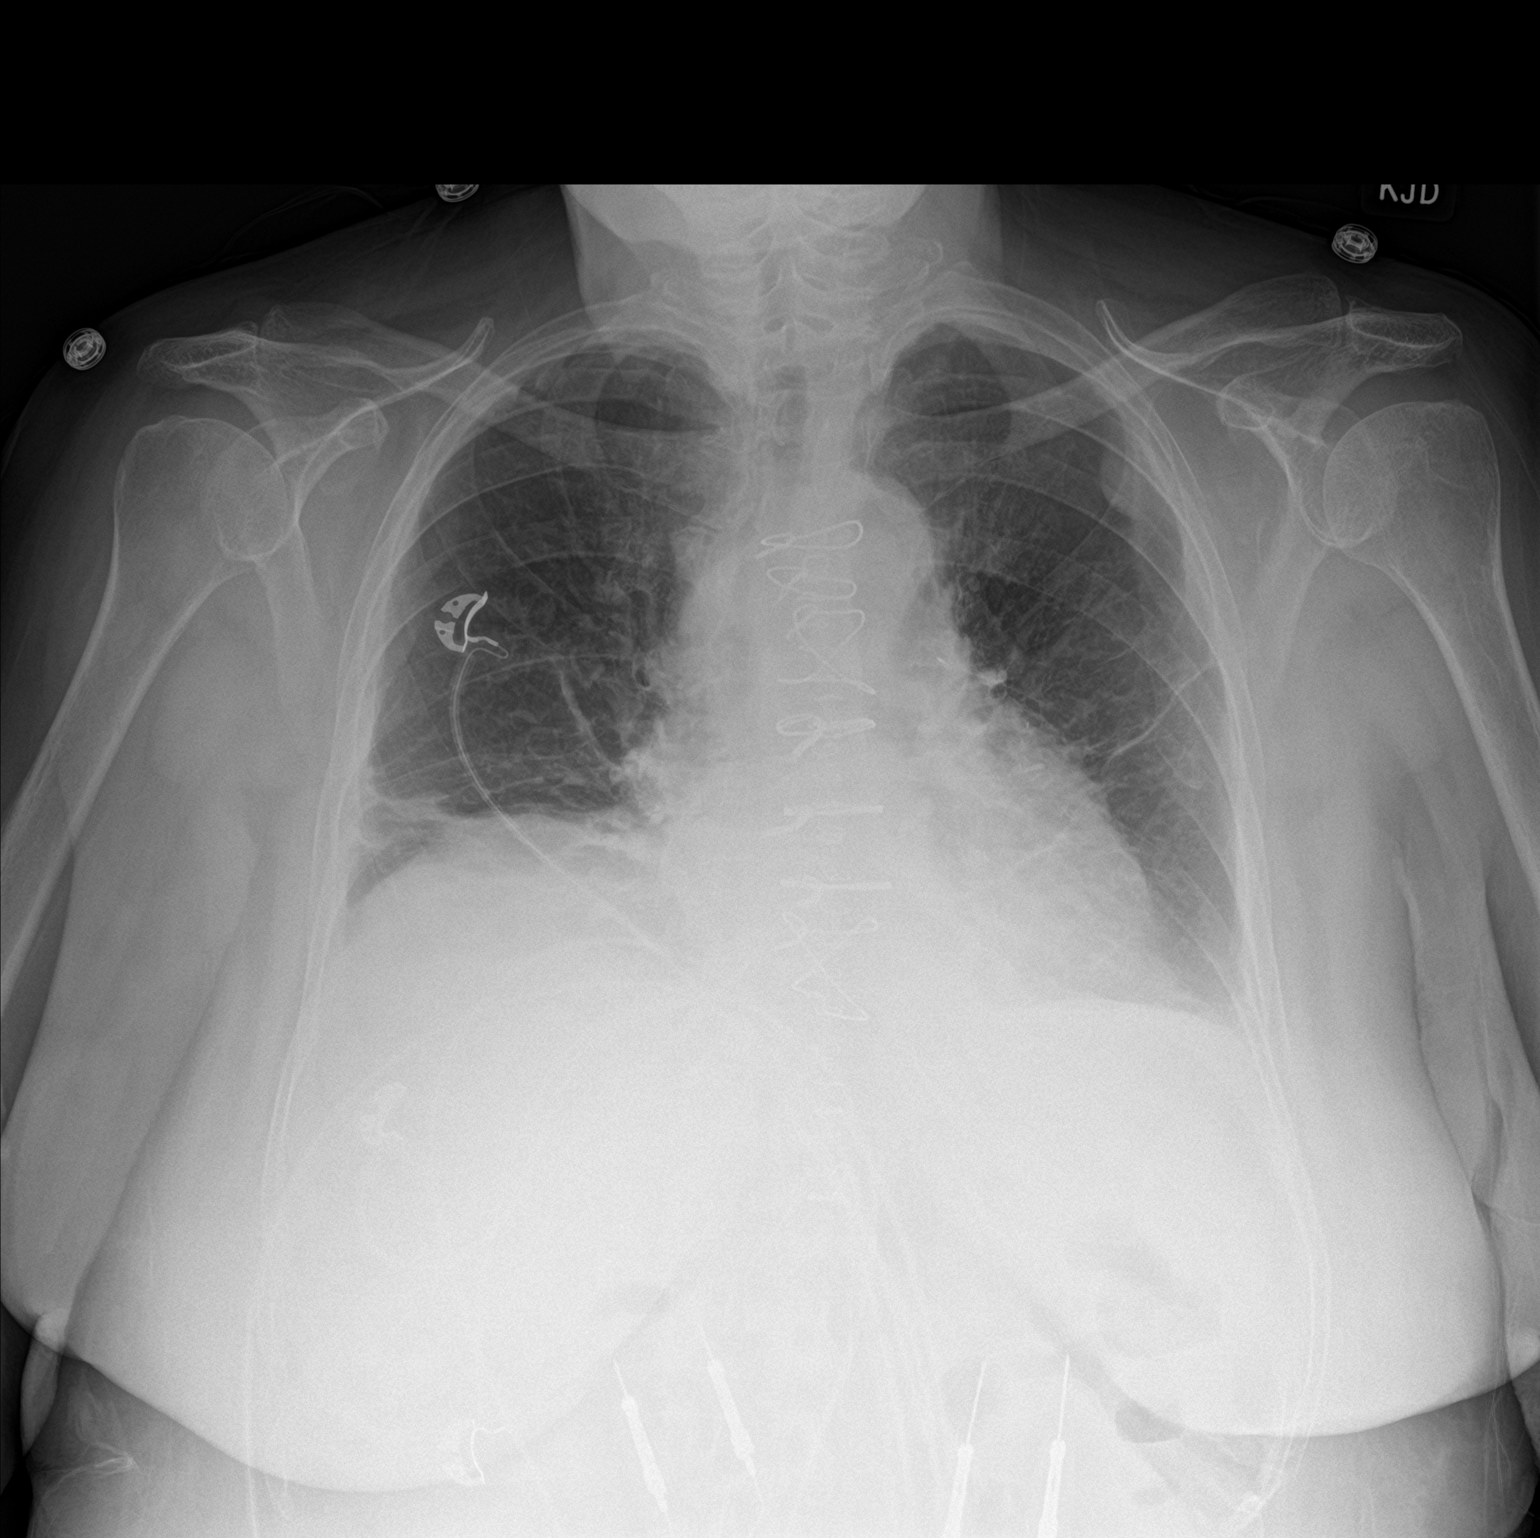

[chest lat]
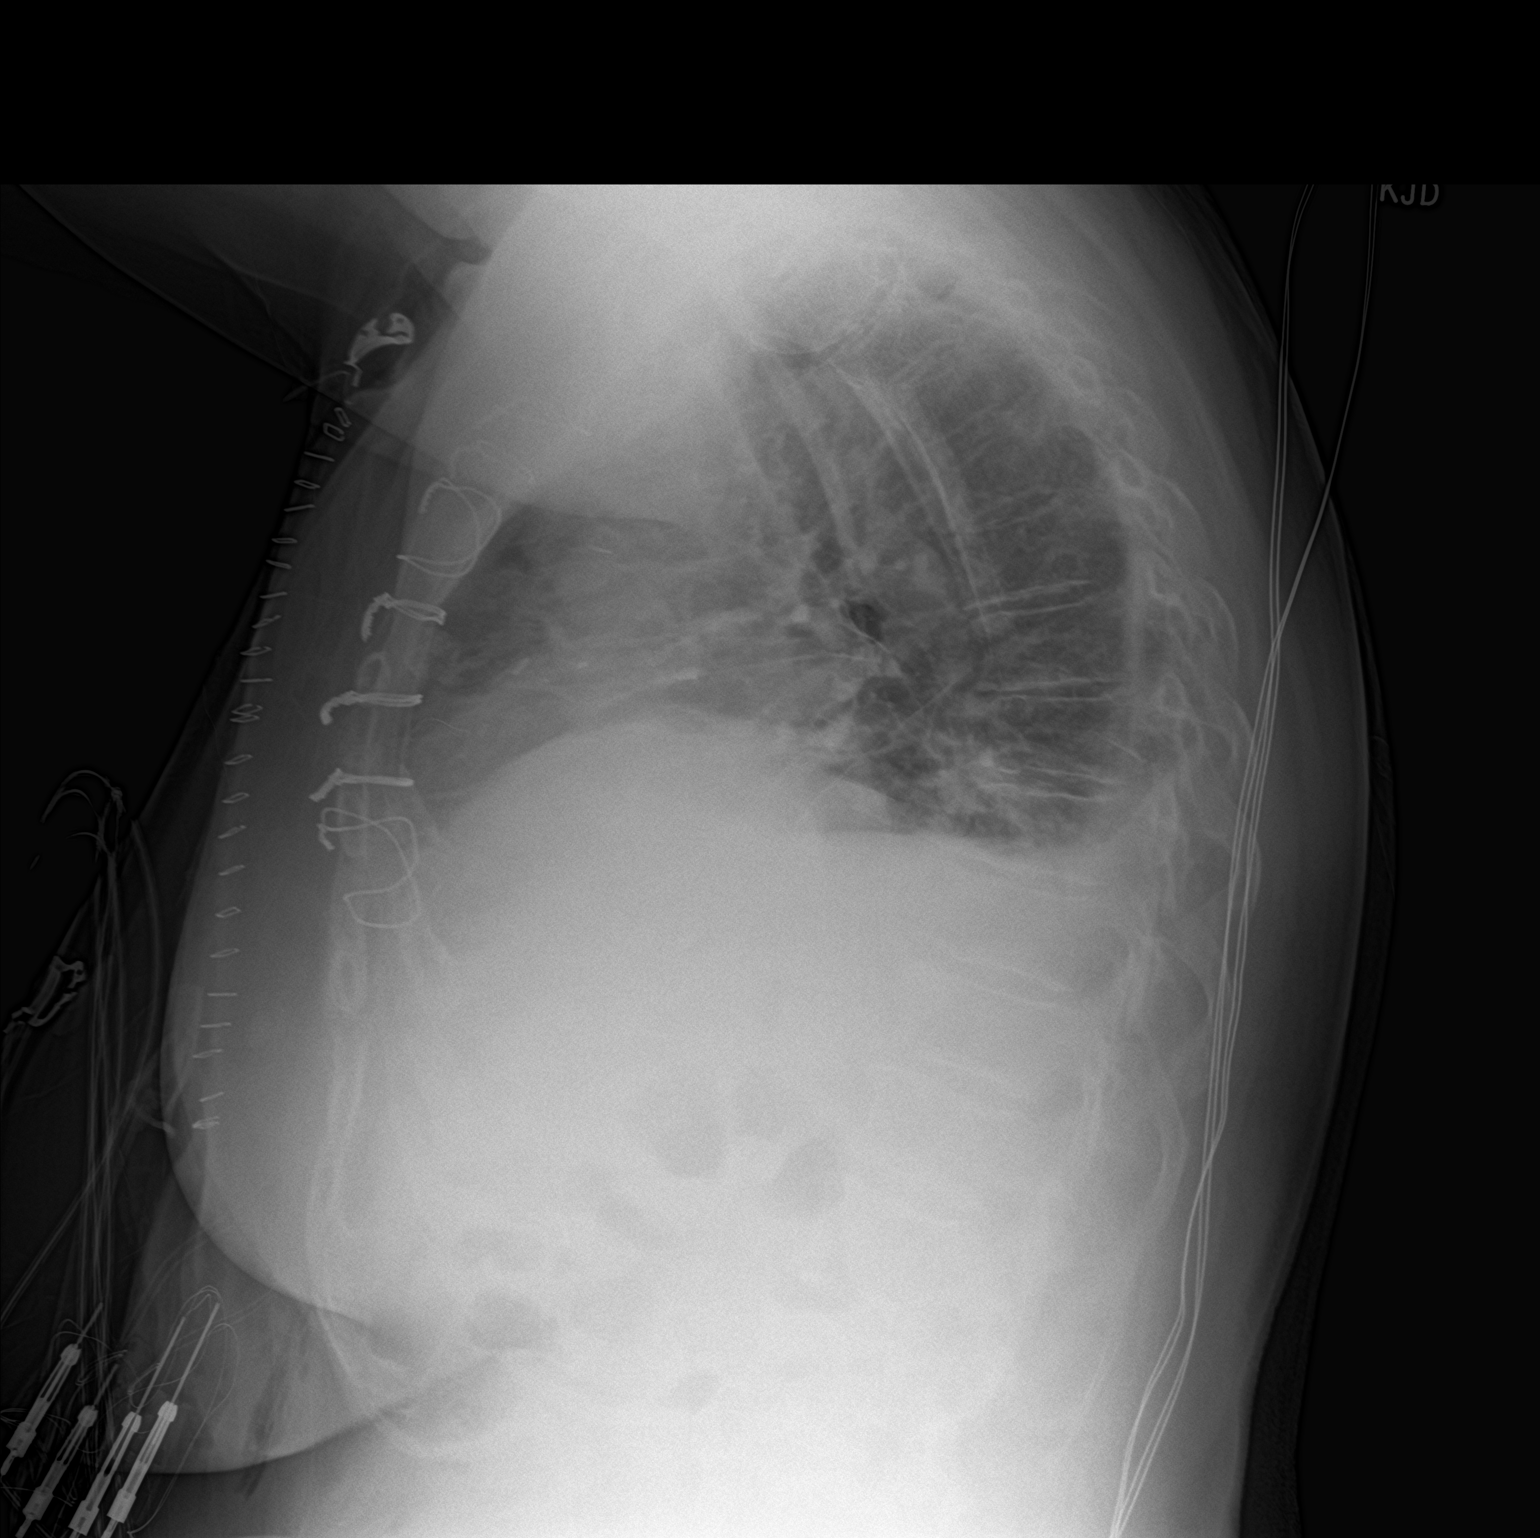

[2 of 2 positions shown; findings below may reference images not displayed]

FINDINGS: Cordis tip as well as bilateral chest tubes and mediastinal drain
have been removed. Temporary pacemaker wires are attached to the
right heart. There is no demonstrable pneumothorax. There is
bibasilar atelectasis with small left pleural effusion. No
consolidation or edema evident. Heart size and pulmonary vascular
normal. Patient is status post coronary artery bypass grafting. No
adenopathy. No bone lesions.
IMPRESSION: No evident pneumothorax. Bibasilar atelectasis with small left
pleural effusion. No consolidation. Stable cardiac silhouette.

## 2022-06-06 ENCOUNTER — Ambulatory Visit: Payer: Medicare Other | Admitting: Internal Medicine

## 2022-06-13 DIAGNOSIS — J069 Acute upper respiratory infection, unspecified: Secondary | ICD-10-CM | POA: Diagnosis not present

## 2022-06-13 DIAGNOSIS — R0981 Nasal congestion: Secondary | ICD-10-CM | POA: Diagnosis not present

## 2022-06-14 ENCOUNTER — Ambulatory Visit: Payer: Medicare Other | Admitting: Nutrition

## 2022-06-15 DIAGNOSIS — R059 Cough, unspecified: Secondary | ICD-10-CM | POA: Diagnosis not present

## 2022-06-20 DIAGNOSIS — S233XXA Sprain of ligaments of thoracic spine, initial encounter: Secondary | ICD-10-CM | POA: Diagnosis not present

## 2022-06-20 DIAGNOSIS — M9901 Segmental and somatic dysfunction of cervical region: Secondary | ICD-10-CM | POA: Diagnosis not present

## 2022-06-20 DIAGNOSIS — S338XXA Sprain of other parts of lumbar spine and pelvis, initial encounter: Secondary | ICD-10-CM | POA: Diagnosis not present

## 2022-06-20 DIAGNOSIS — M546 Pain in thoracic spine: Secondary | ICD-10-CM | POA: Diagnosis not present

## 2022-06-20 DIAGNOSIS — M6281 Muscle weakness (generalized): Secondary | ICD-10-CM | POA: Diagnosis not present

## 2022-06-20 DIAGNOSIS — M47816 Spondylosis without myelopathy or radiculopathy, lumbar region: Secondary | ICD-10-CM | POA: Diagnosis not present

## 2022-06-20 DIAGNOSIS — M25561 Pain in right knee: Secondary | ICD-10-CM | POA: Diagnosis not present

## 2022-06-20 DIAGNOSIS — M9902 Segmental and somatic dysfunction of thoracic region: Secondary | ICD-10-CM | POA: Diagnosis not present

## 2022-06-20 DIAGNOSIS — M47812 Spondylosis without myelopathy or radiculopathy, cervical region: Secondary | ICD-10-CM | POA: Diagnosis not present

## 2022-06-20 DIAGNOSIS — M9903 Segmental and somatic dysfunction of lumbar region: Secondary | ICD-10-CM | POA: Diagnosis not present

## 2022-06-22 DIAGNOSIS — M6281 Muscle weakness (generalized): Secondary | ICD-10-CM | POA: Diagnosis not present

## 2022-06-22 DIAGNOSIS — M25561 Pain in right knee: Secondary | ICD-10-CM | POA: Diagnosis not present

## 2022-06-27 DIAGNOSIS — M25561 Pain in right knee: Secondary | ICD-10-CM | POA: Diagnosis not present

## 2022-06-27 DIAGNOSIS — M6281 Muscle weakness (generalized): Secondary | ICD-10-CM | POA: Diagnosis not present

## 2022-06-28 ENCOUNTER — Ambulatory Visit: Payer: Medicare Other | Admitting: Nutrition

## 2022-06-28 DIAGNOSIS — M6281 Muscle weakness (generalized): Secondary | ICD-10-CM | POA: Diagnosis not present

## 2022-06-28 DIAGNOSIS — M25561 Pain in right knee: Secondary | ICD-10-CM | POA: Diagnosis not present

## 2022-06-29 DIAGNOSIS — I10 Essential (primary) hypertension: Secondary | ICD-10-CM | POA: Diagnosis not present

## 2022-06-29 DIAGNOSIS — M25552 Pain in left hip: Secondary | ICD-10-CM | POA: Diagnosis not present

## 2022-06-29 DIAGNOSIS — Z299 Encounter for prophylactic measures, unspecified: Secondary | ICD-10-CM | POA: Diagnosis not present

## 2022-06-29 DIAGNOSIS — E1165 Type 2 diabetes mellitus with hyperglycemia: Secondary | ICD-10-CM | POA: Diagnosis not present

## 2022-06-29 DIAGNOSIS — R42 Dizziness and giddiness: Secondary | ICD-10-CM | POA: Diagnosis not present

## 2022-07-04 DIAGNOSIS — M6281 Muscle weakness (generalized): Secondary | ICD-10-CM | POA: Diagnosis not present

## 2022-07-04 DIAGNOSIS — M25561 Pain in right knee: Secondary | ICD-10-CM | POA: Diagnosis not present

## 2022-07-06 DIAGNOSIS — M25561 Pain in right knee: Secondary | ICD-10-CM | POA: Diagnosis not present

## 2022-07-06 DIAGNOSIS — M6281 Muscle weakness (generalized): Secondary | ICD-10-CM | POA: Diagnosis not present

## 2022-07-10 DIAGNOSIS — M25561 Pain in right knee: Secondary | ICD-10-CM | POA: Diagnosis not present

## 2022-07-10 DIAGNOSIS — M6281 Muscle weakness (generalized): Secondary | ICD-10-CM | POA: Diagnosis not present

## 2022-07-11 ENCOUNTER — Ambulatory Visit: Payer: 59 | Admitting: Podiatry

## 2022-07-11 DIAGNOSIS — E1140 Type 2 diabetes mellitus with diabetic neuropathy, unspecified: Secondary | ICD-10-CM | POA: Diagnosis not present

## 2022-07-11 DIAGNOSIS — M2032 Hallux varus (acquired), left foot: Secondary | ICD-10-CM

## 2022-07-11 DIAGNOSIS — M2042 Other hammer toe(s) (acquired), left foot: Secondary | ICD-10-CM | POA: Diagnosis not present

## 2022-07-11 DIAGNOSIS — L84 Corns and callosities: Secondary | ICD-10-CM

## 2022-07-14 NOTE — Progress Notes (Signed)
  Subjective:  Patient ID: Katrina Castro, female    DOB: 02-19-1946,  MRN: [id number]  Chief Complaint  Patient presents with   Callouses    Callus removal. Consult for Sx    77 y.o. female presents with the above complaint. History confirmed with patient.  She returns for follow-up on multiple pedal issues including her toe deformities and the painful calluses.  Objective:  Physical Exam: warm, good capillary refill, no trophic changes or ulcerative lesions, normal DP and PT pulses, and some neuropathy noted, she has painful tender hyperkeratoses left subhallux, left submetatarsal 5, right submetatarsal 4  Radiographs: Multiple views x-ray of the left foot: Previous radiographs show significant hammertoe and hallux varus deformity Assessment:   1. Controlled type 2 diabetes with neuropathy (HCC)   2. Callus of foot   3. Acquired hallux varus of left foot   4. Hammertoe of left foot      Plan:  Patient was evaluated and treated and all questions answered.  All symptomatic hyperkeratoses were safely debrided with a sterile #15 blade to patient's level of comfort without incident. We discussed preventative and palliative care of these lesions including supportive and accommodative shoegear, padding, prefabricated and custom molded accommodative orthoses, use of a pumice stone and lotions/creams daily.   I discussed with her that the toe deformities that she has at this point likely would only improve with surgical intervention but her cardiac risk likely outweighs the benefit at this point.  It does make shoe gear difficult to find sometimes but is not particularly painful and she has been able to adapt some.  I would recommend we find a solution with nonoperative treatment at this point.  Return in about 10 weeks (around 09/19/2022) for at risk diabetic foot care.

## 2022-07-16 DIAGNOSIS — E538 Deficiency of other specified B group vitamins: Secondary | ICD-10-CM | POA: Diagnosis not present

## 2022-07-16 DIAGNOSIS — I10 Essential (primary) hypertension: Secondary | ICD-10-CM | POA: Diagnosis not present

## 2022-07-16 DIAGNOSIS — W540XXA Bitten by dog, initial encounter: Secondary | ICD-10-CM | POA: Diagnosis not present

## 2022-07-16 DIAGNOSIS — Z299 Encounter for prophylactic measures, unspecified: Secondary | ICD-10-CM | POA: Diagnosis not present

## 2022-07-16 DIAGNOSIS — S61459A Open bite of unspecified hand, initial encounter: Secondary | ICD-10-CM | POA: Diagnosis not present

## 2022-07-18 ENCOUNTER — Other Ambulatory Visit: Payer: Self-pay | Admitting: Internal Medicine

## 2022-07-19 DIAGNOSIS — M25561 Pain in right knee: Secondary | ICD-10-CM | POA: Diagnosis not present

## 2022-07-19 DIAGNOSIS — M6281 Muscle weakness (generalized): Secondary | ICD-10-CM | POA: Diagnosis not present

## 2022-08-01 DIAGNOSIS — E1165 Type 2 diabetes mellitus with hyperglycemia: Secondary | ICD-10-CM | POA: Diagnosis not present

## 2022-08-01 DIAGNOSIS — Z79899 Other long term (current) drug therapy: Secondary | ICD-10-CM | POA: Diagnosis not present

## 2022-08-01 DIAGNOSIS — R5383 Other fatigue: Secondary | ICD-10-CM | POA: Diagnosis not present

## 2022-08-01 DIAGNOSIS — D509 Iron deficiency anemia, unspecified: Secondary | ICD-10-CM | POA: Diagnosis not present

## 2022-08-01 DIAGNOSIS — Z299 Encounter for prophylactic measures, unspecified: Secondary | ICD-10-CM | POA: Diagnosis not present

## 2022-08-01 DIAGNOSIS — I10 Essential (primary) hypertension: Secondary | ICD-10-CM | POA: Diagnosis not present

## 2022-08-02 ENCOUNTER — Encounter: Payer: Self-pay | Admitting: Internal Medicine

## 2022-08-02 ENCOUNTER — Ambulatory Visit (INDEPENDENT_AMBULATORY_CARE_PROVIDER_SITE_OTHER): Payer: Medicare Other | Admitting: Internal Medicine

## 2022-08-02 VITALS — BP 110/70 | HR 69 | Ht 63.0 in | Wt 138.6 lb

## 2022-08-02 DIAGNOSIS — E785 Hyperlipidemia, unspecified: Secondary | ICD-10-CM | POA: Diagnosis not present

## 2022-08-02 DIAGNOSIS — E1159 Type 2 diabetes mellitus with other circulatory complications: Secondary | ICD-10-CM

## 2022-08-02 DIAGNOSIS — E1165 Type 2 diabetes mellitus with hyperglycemia: Secondary | ICD-10-CM | POA: Diagnosis not present

## 2022-08-02 LAB — POCT GLYCOSYLATED HEMOGLOBIN (HGB A1C): Hemoglobin A1C: 7.6 % — AB (ref 4.0–5.6)

## 2022-08-02 MED ORDER — METFORMIN HCL ER 500 MG PO TB24: 500.0000 mg | ORAL_TABLET | Freq: Two times a day (BID) | ORAL | 3 refills | Status: DC

## 2022-08-02 NOTE — Patient Instructions (Addendum)
Please use the following regimen: - Metformin ER 500 mg 2x a day - Glipizide ER 2.5 mg before breakfast - Jardiance 25 mg before breakfast  Please use: - Lantus 6 units in am  Try to check blood sugars 1x a day, rotating check times.  Please return in 3-4 months with your sugar log.

## 2022-08-02 NOTE — Progress Notes (Signed)
Patient ID: Katrina Castro, female   DOB: 1946/02/17, 77 y.o.   MRN: [id number]  HPI: Katrina Castro is a 77 y.o.-year-old female, returning for follow-up for DM2, dx in 2015, non-insulin-dependent, uncontrolled, with complications (CAD, s/p CABG; PN). Pt. previously saw Dr. Loanne Drilling.  Last visit with me 4 months ago.  Interim history: + increased urination, + occas. blurry vision, no nausea, chest pain. Occas. Dizziness. She had a URI during Christmas.  Reviewed HbA1c: Lab Results  Component Value Date   HGBA1C 7.8 (H) 03/23/2022   HGBA1C 7.0 (A) 11/17/2021   HGBA1C 7.3 (A) 09/20/2021   HGBA1C 8.0 (A) 07/26/2021   HGBA1C 6.4 (H) 05/12/2020   HGBA1C 7.5 (H) 04/25/2020   HGBA1C 7.5 (H) 04/25/2020   Pt is on a regimen of: - Metformin ER 500 mg 2x daily - Glipizide ER 2.5 mg before breakfast -  -151 $ per mo - she tried the PAP but she did not qualify >> off >> Jardiance 25 mg daily - Insulin 10 units started by PCP last week. Took it once >> only uses it when high cbg (4-6 units). Still does not know what insulin she is taking... We called the pharmacy during the visit and she is apparently on Lantus. She tried Rybelsus >> nausea, but retrospectively, this was 2/2 Esophageal pbs.  Pt checks her sugars 0-1x a day and they are: - am: 62, 108-114, 160 (watermelon) >> 120 (insulin), 130-200 >> 110-140 per her recall - 2h after b'fast: n/c - before lunch: n/c - 2h after lunch: n/c - before dinner: n/c - 2h after dinner: n/c - bedtime: n/c >> 200, 300 -cannot remember other values as she is not usually checking at this time unless she feels poorly. - nighttime: n/c Lowest sugar was 62 (took Glipizide at bedtime as she forgot it in am) >> 120 >> 110 ; she has hypoglycemia awareness at 70.  Highest sugar was 160 >> 240 >>  300 (URI-antibiotics).  Glucometer:Prodigy  - no CKD, last BUN/creatinine:  Lab Results  Component Value Date   BUN 12 03/23/2022   BUN 15 07/21/2021    CREATININE 0.76 03/23/2022   CREATININE 0.76 07/21/2021  She is not on ACE inhibitor or ARB.  -+ HL; last set of lipids: Lab Results  Component Value Date   CHOL 112 03/23/2022   HDL 48 03/23/2022   LDLCALC 36 03/23/2022   TRIG 138 03/23/2022   CHOLHDL 2.3 03/23/2022  On Crestor 40, Zetia 10.  - last eye exam was in 09/2021. No DR - reportedly.   - no numbness and tingling in her feet.  She has a history of left foot surgery.  Last foot exam 07/11/2022 by Dr. Sherryle Lis with podiatry.  She also has a history of atrial fibrillation, HTN, GERD, depression. She is on B12 supplements.  She goes to the gym - treadmill.  She was abandoned by her mother in a hotel when she was 60 weeks old. She is working part time - caregiver for cancer patient.  She used to drive a truck for many years.  ROS: + see HPI  Past Medical History:  Diagnosis Date   Arthritis    CAD (coronary artery disease)    a. s/p CABG on 04/27/2020 with LIMA-LAD, seq SVG-PDA-PL, seq left radial-RI-OM   Concussion    2015   Depression    Diabetes ([place])    type 2   Dysphagia    Dysrhythmia    afib  GERD (gastroesophageal reflux disease)    Headache    History of bronchitis    Hypertension    Seasonal allergies    Toenail fungus    Past Surgical History:  Procedure Laterality Date   APPENDECTOMY     BACK SURGERY     x2   BOTOX INJECTION N/A 06/06/2016   Procedure: BOTOX INJECTION;  Surgeon: Rogene Houston, MD;  [facility];  Service: Endoscopy;  Laterality: N/A;   BOTOX INJECTION N/A 01/17/2021   Procedure: BOTOX INJECTION;  Surgeon: Harvel Quale, MD;  [facility];  Service: Gastroenterology;  Laterality: N/A;   BOTOX INJECTION N/A 07/11/2021   Procedure: BOTOX INJECTION;  Surgeon: Harvel Quale, MD;  [facility];  Service: Gastroenterology;  Laterality: N/A;   CHOLECYSTECTOMY     COLONOSCOPY N/A 11/05/2012   Procedure: COLONOSCOPY;  Surgeon:  Rogene Houston, MD;  [facility];  Service: Endoscopy;  Laterality: N/A;  1030   COLONOSCOPY WITH PROPOFOL N/A 05/01/2019   Procedure: COLONOSCOPY WITH PROPOFOL;  Surgeon: Rogene Houston, MD;  [facility];  Service: Endoscopy;  Laterality: N/A;  11:20am   CORONARY ARTERY BYPASS GRAFT N/A 04/27/2020   Procedure: CORONARY ARTERY BYPASS GRAFTING (CABG) TIMES FIVE USING LEFT INTERNAL MAMMARY ARTERY, LEFT HARVESTED RADIAL ARTERY, RIGHT GREATER SAPHENOUS VEIN HARVESTED ENDOSCOPICALLY.;  Surgeon: [name], MD;  Location: [place];  Service: Open Heart Surgery;  Laterality: N/A;   ESOPHAGEAL DILATION N/A 07/06/2015   Procedure: ESOPHAGEAL DILATION;  Surgeon: Rogene Houston, MD;  [facility];  Service: Endoscopy;  Laterality: N/A;   ESOPHAGEAL DILATION N/A 06/06/2016   Procedure: ESOPHAGEAL DILATION;  Surgeon: Rogene Houston, MD;  [facility];  Service: Endoscopy;  Laterality: N/A;   ESOPHAGOGASTRODUODENOSCOPY N/A 02/12/2014   Procedure: ESOPHAGOGASTRODUODENOSCOPY (EGD);  Surgeon: Rogene Houston, MD;  [facility];  Service: Endoscopy;  Laterality: N/A;  150   ESOPHAGOGASTRODUODENOSCOPY N/A 07/06/2015   Procedure: ESOPHAGOGASTRODUODENOSCOPY (EGD);  Surgeon: Rogene Houston, MD;  [facility];  Service: Endoscopy;  Laterality: N/A;  1:25 - moved to 1/18 @ 10:30 - Ann to notify pt   ESOPHAGOGASTRODUODENOSCOPY (EGD) WITH ESOPHAGEAL DILATION N/A 07/25/2012   Procedure: ESOPHAGOGASTRODUODENOSCOPY (EGD) WITH ESOPHAGEAL DILATION;  Surgeon: Rogene Houston, MD;  [facility];  Service: Endoscopy;  Laterality: N/A;  325-rescheduled to [place] notified pt   ESOPHAGOGASTRODUODENOSCOPY (EGD) WITH PROPOFOL N/A 06/06/2016   Procedure: ESOPHAGOGASTRODUODENOSCOPY (EGD) WITH PROPOFOL;  Surgeon: Rogene Houston, MD;  [facility];  Service: Endoscopy;  Laterality: N/A;   ESOPHAGOGASTRODUODENOSCOPY (EGD) WITH PROPOFOL N/A  01/17/2021   Procedure: ESOPHAGOGASTRODUODENOSCOPY (EGD) WITH PROPOFOL;  Surgeon: Harvel Quale, MD;  [facility];  Service: Gastroenterology;  Laterality: N/A;  10:35   ESOPHAGOGASTRODUODENOSCOPY (EGD) WITH PROPOFOL N/A 07/11/2021   Procedure: ESOPHAGOGASTRODUODENOSCOPY (EGD) WITH PROPOFOL;  Surgeon: Harvel Quale, MD;  [facility];  Service: Gastroenterology;  Laterality: N/A;  11:15   EYE SURGERY     cataracts removed   Foot surgeries Bilateral    hammer toes   LEFT HEART CATH AND CORONARY ANGIOGRAPHY N/A 04/26/2020   Procedure: LEFT HEART CATH AND CORONARY ANGIOGRAPHY;  Surgeon: Martinique, Peter M, MD;  Location: [facility];  Service: Cardiovascular;  Laterality: N/A;   MALONEY DILATION N/A 02/12/2014   Procedure: Venia Minks DILATION;  Surgeon: Rogene Houston, MD;  [facility];  Service: Endoscopy;  Laterality: N/A;   POLYPECTOMY  05/01/2019   Procedure: POLYPECTOMY;  Surgeon: Rogene Houston, MD;  [facility];  Service: Endoscopy;;  colon    RADIAL ARTERY HARVEST Left 04/27/2020   Procedure: LEFT RADIAL ARTERY HARVEST;  Surgeon: [name], MD;  Location: [place];  Service: Open Heart Surgery;  Laterality: Left;   Right knee arthroscopy     x2   TEE WITHOUT CARDIOVERSION N/A 04/27/2020   Procedure: TRANSESOPHAGEAL ECHOCARDIOGRAM (TEE);  Surgeon: [name], MD;  Location: [place];  Service: Open Heart Surgery;  Laterality: N/A;   TONSILLECTOMY     TOTAL ABDOMINAL HYSTERECTOMY     TOTAL KNEE ARTHROPLASTY Right 04/03/2017   Procedure: RIGHT TOTAL KNEE ARTHROPLASTY;  Surgeon: Latanya Maudlin, MD;  Location: [facility];  Service: Orthopedics;  Laterality: Right;   Social History   Socioeconomic History   Marital status: Divorced    Spouse name: Not on file   Number of children: Not on file   Years of education: Not on file   Highest education level: Not on file  Occupational History   Not on file   Tobacco Use   Smoking status: Never   Smokeless tobacco: Never  Vaping Use   Vaping Use: Never used  Substance and Sexual Activity   Alcohol use: No    Alcohol/week: 0.0 standard drinks of alcohol    Comment: socially    Drug use: No   Sexual activity: Not Currently    Partners: Male    Birth control/protection: None  Other Topics Concern   Not on file  Social History Narrative   Not on file   Social Determinants of Health   Financial Resource Strain: Not on file  Food Insecurity: Not on file  Transportation Needs: Not on file  Physical Activity: Not on file  Stress: Not on file  Social Connections: Not on file  Intimate Partner Violence: Not on file   Current Outpatient Medications on File Prior to Visit  Medication Sig Dispense Refill   acetaminophen (TYLENOL) 650 MG CR tablet Take 650 mg by mouth every 8 (eight) hours as needed for pain.     acyclovir (ZOVIRAX) 400 MG tablet Take 400 mg by mouth daily.     ALPRAZolam (XANAX) 0.5 MG tablet Take 0.25 mg by mouth at bedtime as needed for anxiety or sleep.     aspirin 81 MG EC tablet Take 1 tablet (81 mg total) by mouth daily. Swallow whole. 30 tablet 11   Biotin w/ Vitamins C & E (HAIR SKIN & NAILS GUMMIES PO) Take 2 capsules by mouth daily.     Cholecalciferol (VITAMIN D) 50 MCG (2000 UT) tablet Take 2,000 Units by mouth daily.     clotrimazole (LOTRIMIN) 1 % cream Apply 1 application topically 2 (two) times daily as needed (lip cracking).     clotrimazole-betamethasone (LOTRISONE) cream Apply 1 application topically 2 (two) times daily. 30 g 0   Cyanocobalamin (B-12 COMPLIANCE INJECTION IJ) Inject 1 Dose as directed every 30 (thirty) days.     Cyanocobalamin (B-12 PO) Take 1 tablet by mouth daily.     diclofenac Sodium (VOLTAREN) 1 % GEL Apply 1 application topically 4 (four) times daily as needed (pain).     dicyclomine (BENTYL) 10 MG capsule Take 1 capsule (10 mg total) by mouth every 12 (twelve) hours as needed for  spasms (abdominal pain). 30 capsule 0   empagliflozin (JARDIANCE) 25 MG TABS tablet Take 1  tablet (25 mg total) by mouth daily before breakfast. 90 tablet 1   ezetimibe (ZETIA) 10 MG tablet TAKE 1 TABLET ONCE DAILY. 90 tablet 3   furosemide (LASIX) 40 MG tablet Take 40 mg every THIRD day until your weight is back to baseline 30 tablet 0   glipiZIDE (GLUCOTROL XL) 2.5 MG 24 hr tablet Take 1 tablet (2.5 mg total) by mouth daily with breakfast. 90 tablet 3   levocetirizine (XYZAL) 5 MG tablet Take 5 mg by mouth daily.     Menthol-Methyl Salicylate (MUSCLE RUB EX) Apply 1 application topically daily as needed (muscle pain). [place] gel     metFORMIN (GLUCOPHAGE-XR) 500 MG 24 hr tablet Take 1 tablet (500 mg total) by mouth daily with breakfast. 90 tablet 3   metoprolol tartrate (LOPRESSOR) 25 MG tablet TAKE (1/2) TABLET BY MOUTH 2 TIMES A DAY. 30 tablet 6   Multiple Vitamins-Minerals (ADULT GUMMY PO) Take 2 capsules by mouth daily.     nitroGLYCERIN (NITROSTAT) 0.4 MG SL tablet Place 1 tablet (0.4 mg total) under the tongue every 5 (five) minutes as needed for chest pain. 25 tablet 3   oxybutynin (DITROPAN-XL) 10 MG 24 hr tablet Take 10 mg by mouth daily.     pantoprazole (PROTONIX) 40 MG tablet Take 40 mg by mouth daily.     potassium chloride (KLOR-CON) 10 MEQ tablet Take 10 meq every THIRD day until weight is back to baseline. Take with Lasix 30 tablet 0   PRESCRIPTION MEDICATION Apply 1 application topically 2 (two) times a week. Dexameth Na Ph 0.4%     rosuvastatin (CRESTOR) 40 MG tablet TAKE 1 TABLET BY MOUTH ONCE DAILY. 90 tablet 3   urea (CARMOL) 40 % CREA Apply 1 application topically daily. 120 g 2   No current facility-administered medications on file prior to visit.   Allergies  Allergen Reactions   Levofloxacin Anaphylaxis and Rash   Cardizem [Diltiazem] Other (See Comments)    Patient states that she could not think, felt that she was in the twilight zone. Arm discomfort.   Sulfa  Antibiotics Itching   Diazepam Nausea Only   Lipitor [Atorvastatin] Other (See Comments)    Caused her body to be "out of whack"  - elevated sugar also.   Family History  Problem Relation Age of Onset   Diabetes Mother    Stroke Father    Diabetes Sister    Colon cancer Neg Hx    Breast cancer Neg Hx    PE: BP 110/70 (BP Location: Right Arm, Patient Position: Sitting, Cuff Size: Normal)   Pulse 69   Ht 5' 3"$  (1.6 m)   Wt 138 lb 9.6 oz (62.9 kg)   SpO2 97%   BMI 24.55 kg/m  Wt Readings from Last 3 Encounters:  08/02/22 138 lb 9.6 oz (62.9 kg)  04/25/22 144 lb 3.2 oz (65.4 kg)  03/23/22 140 lb (63.5 kg)   Constitutional: normal weight, in NAD Eyes: EOMI, no exophthalmos ENT: no thyromegaly, no cervical lymphadenopathy Cardiovascular: RRR, No MRG Respiratory: CTA B Musculoskeletal: no deformities Skin: moist, warm, no rashes Neurological: no tremor with outstretched hands  ASSESSMENT: 1. DM2, non-insulin-dependent, uncontrolled, with complications - CAD, s/p CABG 2021 - cardiologist: Dr. Harrington Challenger - PN  2. HL  PLAN:  1. Patient with longstanding, uncontrolled, type 2 diabetes, on oral antidiabetic regimen with metformin, sulfonylurea low-dose, and SGLT2 inhibitor along with long-acting insulin -however, she is taking this inconsistently, only when sugars are high at bedtime,  and even then she is taking a lower dose.  At last visit, HbA1c was higher, at 7.8%.  Sugars were much higher as she could not get [place].  We gave her paperwork to obtain Jardiance through the patient assistance program.  We also gave her samples of Jardiance for 2 weeks.  At that time, she was telling me that she was started on insulin and I advised her to continue this at 7 units.  She was not sure which insulin she was taking and I did not have records of this.  She was also interested in a CGM and a referral to nutrition.  I placed the referral but she did not have the appointment yet. -At today's visit,  sugars are improving in the morning per her recall, but she is not checking later in the day.  She checked when she was sick and sugars are higher.  Since her HbA1c is still above target, I did advise her to try to take Lantus consistently, but will wait in the morning to avoid a precipitous drop in blood sugars overnight.  I also highly recommended to check sugars later in the day, also, and I gave her a blood sugar log. - I suggested to:  Patient Instructions  Please use the following regimen: - Metformin ER 500 mg 2x a day - Glipizide ER 2.5 mg before breakfast - Jardiance 25 mg before breakfast  Please use: - Lantus 6 units in am  Try to check blood sugars 1x a day, rotating check times.  Please return in 3-4 months with your sugar log.   - we checked her HbA1c: 7.6% (lower) - advised to check sugars at different times of the day - 4x a day, rotating check times - advised for yearly eye exams >> she is UTD - return to clinic in 3-4 months  2. HL -Reviewed latest lipid panel from 03/2022: All fractions at goal: Lab Results  Component Value Date   CHOL 112 03/23/2022   HDL 48 03/23/2022   LDLCALC 36 03/23/2022   TRIG 138 03/23/2022   CHOLHDL 2.3 03/23/2022  -She is on Crestor 40 mg daily and Zetia 10 mg daily without side effects  Philemon Kingdom, MD PhD [facility] Endocrinology

## 2022-08-14 ENCOUNTER — Other Ambulatory Visit: Payer: Self-pay | Admitting: Internal Medicine

## 2022-08-14 ENCOUNTER — Ambulatory Visit (INDEPENDENT_AMBULATORY_CARE_PROVIDER_SITE_OTHER): Payer: Medicare Other | Admitting: Gastroenterology

## 2022-08-14 ENCOUNTER — Telehealth (INDEPENDENT_AMBULATORY_CARE_PROVIDER_SITE_OTHER): Payer: Self-pay | Admitting: *Deleted

## 2022-08-14 ENCOUNTER — Encounter (INDEPENDENT_AMBULATORY_CARE_PROVIDER_SITE_OTHER): Payer: Self-pay | Admitting: Gastroenterology

## 2022-08-14 VITALS — BP 121/50 | HR 71 | Temp 97.8°F | Ht 63.0 in | Wt 139.4 lb

## 2022-08-14 DIAGNOSIS — R1310 Dysphagia, unspecified: Secondary | ICD-10-CM | POA: Diagnosis not present

## 2022-08-14 DIAGNOSIS — K219 Gastro-esophageal reflux disease without esophagitis: Secondary | ICD-10-CM

## 2022-08-14 MED ORDER — OMEPRAZOLE 40 MG PO CPDR: 40.0000 mg | DELAYED_RELEASE_CAPSULE | Freq: Every day | ORAL | 1 refills | Status: DC

## 2022-08-14 NOTE — Telephone Encounter (Signed)
PA approved via [place] #: [id number], DOS: Aug 15, 2022 - Nov 13, 2022

## 2022-08-14 NOTE — Patient Instructions (Signed)
Please stop pantoprazole Start omeprazole '40mg'$  daily Please avoid thicker, drier foods such as bread and meat, make sure to take small bites, chew thoroughly, take sips of liquids between bites. We will get you scheduled for upper endoscopy   Follow up 3 months

## 2022-08-14 NOTE — H&P (View-Only) (Signed)
Referring Provider: Glenda Chroman, MD Primary Care Physician:  Glenda Chroman, MD Primary GI Physician: Jenetta Downer   Chief Complaint  Patient presents with   Dysphagia    Patient here today due to having issues with dysphagia. Patient reports she got choked on her food on Saturday and had to do the upper thrust/ Hemileich maneuver on her self.   HPI:   Katrina Castro is a 77 y.o. female with past medical history of c. Difficile, coronary artery disease status post CABG, hypertension, GERD, type III achalasia, type 2 diabetes and depression   Patient presenting today for dysphagia.   Last seen in July 2022, at that time taking iron and vitamin B12 for anemia. having worsening bloating and has had a bowel every other day which is different from her usual, since taking iron. Taking a stool softener daily. Also having presented episodes of regurgitation and discomfort in her mid chest. for the last 6-8 months she has had dysphagia especially when eating bread or meat, which she has avoided to improve her symptoms.   noticed that more recently even regular soft food causes persistent and recurrent symptoms. She reports having significant choking spells when eating food in general -she is concerned that she may aspirate some of the food.  Occasionally she has some heartburn and belching which she does not think has improved with the PPI  Options of Heller myotomy, peroral endoscopic myotomy, balloon dilation with pneumatic balloon or Botox injection discussed with the patient in regards to her achalasia. Advised to start miralax, continue nexium '40mg'$  daily. Patient later decided to undergo EGD with botox injection.  EGD done in August 2022  She was referred to [facility] in December though continued to have dysphagia  Repeat EGD done January 2023, as patient continued to have symptoms, she was evaluated at [facility], investigations performed there did not show any changes highly consistent with  achalasia, suspected possible that Botox has helped decreasing the dysmotility but is unclear if she indeed had a diagnosis of achalasia.  Due to this, she was not considered a candidate for POEM. she was prescribed Levsin by Dr. Filbert Schilder to improve her symptoms.  She had some mild improvement with it.  was advised to continue to follow with Dr. Filbert Schilder, empiric dilations PRN.  Present:  Patient reports some ongoing dysphagia, she ate some chicken on Saturday night that she reportedly cut up very small, after about 2 bites, she felt food was stuck in mid chest, it would not go down or come up. She tried to drink water but it was also coming right back up. She eventually had to perform the heimleich maneuver on herself as she lives alone, this was eventually successful and she was able to get the food up. She notes she tries to cut her food very small, avoids meats for the most part. She typically will stop eating once foods feel like they are not wanting to go down. She denies any other episodes of food impaction. She is no longer taking levsin she was previously prescribed at [facility], does not recall taking this in the past.  She does note she coughed up a small amount of blood after she performed the heimleich maneuver but no further episodes of this.    She is on pantoprazole for GERD. She tries to avoid trigger foods though still having some issues with heartburn. She takes tums and mylanta though cannot tell me how often she is needing this or having symptoms.  esophageal manometry-[facility] on February 2017 which showed findings consistent with type III achalasia as her IRP was 22 with presence of a spastic esophagus. She had previously responded to the use of Botox  Last Colonoscopy:2020 - The examined portion of the ileum was normal.                           - One small polyp in the proximal sigmoid colon.                            Biopsied-TA                            - External  hemorrhoids. Last Endoscopy:06/2021 - Normal esophagus. Injected.                           - Normal stomach.                           - Normal examined duodenum.                           - No specimens collected.  Recommendations:  TCS due 2027, health permitting   Past Medical History:  Diagnosis Date   Arthritis    CAD (coronary artery disease)    a. s/p CABG on 04/27/2020 with LIMA-LAD, seq SVG-PDA-PL, seq left radial-RI-OM   Concussion    2015   Depression    Diabetes ([place])    type 2   Dysphagia    Dysrhythmia    afib   GERD (gastroesophageal reflux disease)    Headache    History of bronchitis    Hypertension    Seasonal allergies    Toenail fungus     Past Surgical History:  Procedure Laterality Date   APPENDECTOMY     BACK SURGERY     x2   BOTOX INJECTION N/A 06/06/2016   Procedure: BOTOX INJECTION;  Surgeon: Rogene Houston, MD;  [facility];  Service: Endoscopy;  Laterality: N/A;   BOTOX INJECTION N/A 01/17/2021   Procedure: BOTOX INJECTION;  Surgeon: Harvel Quale, MD;  [facility];  Service: Gastroenterology;  Laterality: N/A;   BOTOX INJECTION N/A 07/11/2021   Procedure: BOTOX INJECTION;  Surgeon: Harvel Quale, MD;  [facility];  Service: Gastroenterology;  Laterality: N/A;   CHOLECYSTECTOMY     COLONOSCOPY N/A 11/05/2012   Procedure: COLONOSCOPY;  Surgeon: Rogene Houston, MD;  [facility];  Service: Endoscopy;  Laterality: N/A;  1030   COLONOSCOPY WITH PROPOFOL N/A 05/01/2019   Procedure: COLONOSCOPY WITH PROPOFOL;  Surgeon: Rogene Houston, MD;  [facility];  Service: Endoscopy;  Laterality: N/A;  11:20am   CORONARY ARTERY BYPASS GRAFT N/A 04/27/2020   Procedure: CORONARY ARTERY BYPASS GRAFTING (CABG) TIMES FIVE USING LEFT INTERNAL MAMMARY ARTERY, LEFT HARVESTED RADIAL ARTERY, RIGHT GREATER SAPHENOUS VEIN HARVESTED ENDOSCOPICALLY.;  Surgeon: [name], MD;   Location: [place];  Service: Open Heart Surgery;  Laterality: N/A;   ESOPHAGEAL DILATION N/A 07/06/2015   Procedure: ESOPHAGEAL DILATION;  Surgeon: Rogene Houston, MD;  [facility];  Service: Endoscopy;  Laterality: N/A;   ESOPHAGEAL DILATION N/A 06/06/2016   Procedure:  ESOPHAGEAL DILATION;  Surgeon: Rogene Houston, MD;  [facility];  Service: Endoscopy;  Laterality: N/A;   ESOPHAGOGASTRODUODENOSCOPY N/A 02/12/2014   Procedure: ESOPHAGOGASTRODUODENOSCOPY (EGD);  Surgeon: Rogene Houston, MD;  [facility];  Service: Endoscopy;  Laterality: N/A;  150   ESOPHAGOGASTRODUODENOSCOPY N/A 07/06/2015   Procedure: ESOPHAGOGASTRODUODENOSCOPY (EGD);  Surgeon: Rogene Houston, MD;  [facility];  Service: Endoscopy;  Laterality: N/A;  1:25 - moved to 1/18 @ 10:30 - Ann to notify pt   ESOPHAGOGASTRODUODENOSCOPY (EGD) WITH ESOPHAGEAL DILATION N/A 07/25/2012   Procedure: ESOPHAGOGASTRODUODENOSCOPY (EGD) WITH ESOPHAGEAL DILATION;  Surgeon: Rogene Houston, MD;  [facility];  Service: Endoscopy;  Laterality: N/A;  325-rescheduled to [place] notified pt   ESOPHAGOGASTRODUODENOSCOPY (EGD) WITH PROPOFOL N/A 06/06/2016   Procedure: ESOPHAGOGASTRODUODENOSCOPY (EGD) WITH PROPOFOL;  Surgeon: Rogene Houston, MD;  [facility];  Service: Endoscopy;  Laterality: N/A;   ESOPHAGOGASTRODUODENOSCOPY (EGD) WITH PROPOFOL N/A 01/17/2021   Procedure: ESOPHAGOGASTRODUODENOSCOPY (EGD) WITH PROPOFOL;  Surgeon: Harvel Quale, MD;  [facility];  Service: Gastroenterology;  Laterality: N/A;  10:35   ESOPHAGOGASTRODUODENOSCOPY (EGD) WITH PROPOFOL N/A 07/11/2021   Procedure: ESOPHAGOGASTRODUODENOSCOPY (EGD) WITH PROPOFOL;  Surgeon: Harvel Quale, MD;  [facility];  Service: Gastroenterology;  Laterality: N/A;  11:15   EYE SURGERY     cataracts removed   Foot surgeries Bilateral    hammer toes   LEFT HEART CATH AND CORONARY  ANGIOGRAPHY N/A 04/26/2020   Procedure: LEFT HEART CATH AND CORONARY ANGIOGRAPHY;  Surgeon: Martinique, Peter M, MD;  Location: [place] [facility];  Service: Cardiovascular;  Laterality: N/A;   MALONEY DILATION N/A 02/12/2014   Procedure: Venia Minks DILATION;  Surgeon: Rogene Houston, MD;  [facility];  Service: Endoscopy;  Laterality: N/A;   POLYPECTOMY  05/01/2019   Procedure: POLYPECTOMY;  Surgeon: Rogene Houston, MD;  [facility];  Service: Endoscopy;;  colon    RADIAL ARTERY HARVEST Left 04/27/2020   Procedure: LEFT RADIAL ARTERY HARVEST;  Surgeon: [name], MD;  Location: [place];  Service: Open Heart Surgery;  Laterality: Left;   Right knee arthroscopy     x2   TEE WITHOUT CARDIOVERSION N/A 04/27/2020   Procedure: TRANSESOPHAGEAL ECHOCARDIOGRAM (TEE);  Surgeon: [name], MD;  Location: [place];  Service: Open Heart Surgery;  Laterality: N/A;   TONSILLECTOMY     TOTAL ABDOMINAL HYSTERECTOMY     TOTAL KNEE ARTHROPLASTY Right 04/03/2017   Procedure: RIGHT TOTAL KNEE ARTHROPLASTY;  Surgeon: Latanya Maudlin, MD;  Location: [facility];  Service: Orthopedics;  Laterality: Right;    Current Outpatient Medications  Medication Sig Dispense Refill   acetaminophen (TYLENOL) 650 MG CR tablet Take 650 mg by mouth every 8 (eight) hours as needed for pain.     acyclovir (ZOVIRAX) 400 MG tablet Take 400 mg by mouth daily.     ALPRAZolam (XANAX) 0.5 MG tablet Take 0.25 mg by mouth at bedtime as needed for anxiety or sleep.     aspirin 81 MG EC tablet Take 1 tablet (81 mg total) by mouth daily. Swallow whole. 30 tablet 11   Biotin w/ Vitamins C & E (HAIR SKIN & NAILS GUMMIES PO) Take 2 capsules by mouth daily.     Cholecalciferol (VITAMIN D) 50 MCG (2000 UT) tablet Take 2,000 Units by mouth daily.     clotrimazole (LOTRIMIN) 1 % cream Apply 1 application topically 2 (two)  times daily as needed (lip cracking).     clotrimazole-betamethasone (LOTRISONE) cream Apply 1  application topically 2 (two) times daily. 30 g 0   Cyanocobalamin (B-12 COMPLIANCE INJECTION IJ) Inject 1 Dose as directed every 30 (thirty) days.     Cyanocobalamin (B-12 PO) Take 1 tablet by mouth daily.     diclofenac Sodium (VOLTAREN) 1 % GEL Apply 1 application topically 4 (four) times daily as needed (pain).     empagliflozin (JARDIANCE) 25 MG TABS tablet Take 1 tablet (25 mg total) by mouth daily before breakfast. 90 tablet 1   ezetimibe (ZETIA) 10 MG tablet TAKE 1 TABLET ONCE DAILY. 90 tablet 3   glipiZIDE (GLUCOTROL XL) 2.5 MG 24 hr tablet Take 1 tablet (2.5 mg total) by mouth daily with breakfast. 90 tablet 3   LANTUS SOLOSTAR 100 UNIT/ML Solostar Pen Inject into the skin.     levocetirizine (XYZAL) 5 MG tablet Take 5 mg by mouth daily.     Menthol-Methyl Salicylate (MUSCLE RUB EX) Apply 1 application topically daily as needed (muscle pain). [place] gel     metFORMIN (GLUCOPHAGE-XR) 500 MG 24 hr tablet Take 1 tablet (500 mg total) by mouth 2 (two) times daily with a meal. (Patient taking differently: Take 1,000 mg by mouth 2 (two) times daily with a meal.) 180 tablet 3   metoprolol tartrate (LOPRESSOR) 25 MG tablet TAKE (1/2) TABLET BY MOUTH 2 TIMES A DAY. 30 tablet 6   Multiple Vitamins-Minerals (ADULT GUMMY PO) Take 2 capsules by mouth daily.     nitroGLYCERIN (NITROSTAT) 0.4 MG SL tablet Place 1 tablet (0.4 mg total) under the tongue every 5 (five) minutes as needed for chest pain. 25 tablet 3   oxybutynin (DITROPAN-XL) 10 MG 24 hr tablet Take 10 mg by mouth daily.     pantoprazole (PROTONIX) 40 MG tablet Take 40 mg by mouth daily.     rosuvastatin (CRESTOR) 40 MG tablet TAKE 1 TABLET BY MOUTH ONCE DAILY. 90 tablet 3   dicyclomine (BENTYL) 10 MG capsule Take 1 capsule (10 mg total) by mouth every 12 (twelve) hours as needed for spasms (abdominal pain). (Patient not taking: Reported on 08/14/2022) 30 capsule 0   No current facility-administered medications for this visit.    Allergies  as of 08/14/2022 - Review Complete 08/14/2022  Allergen Reaction Noted   Levofloxacin Anaphylaxis and Rash 04/03/2017   Cardizem [diltiazem] Other (See Comments) 08/17/2015   Sulfa antibiotics Itching 07/13/2014   Diazepam Nausea Only 04/03/2017   Lipitor [atorvastatin] Other (See Comments) 10/12/2019    Family History  Problem Relation Age of Onset   Diabetes Mother    Stroke Father    Diabetes Sister    Colon cancer Neg Hx    Breast cancer Neg Hx     Social History   Socioeconomic History   Marital status: Divorced    Spouse name: Not on file   Number of children: Not on file   Years of education: Not on file   Highest education level: Not on file  Occupational History   Not on file  Tobacco Use   Smoking status: Never   Smokeless tobacco: Never  Vaping Use   Vaping Use: Never used  Substance and Sexual Activity   Alcohol use: No    Alcohol/week: 0.0 standard drinks of alcohol    Comment: socially    Drug use: No   Sexual activity: Not Currently    Partners: Male    Birth control/protection: None  Other Topics Concern   Not on file  Social History Narrative   Not on file   Social Determinants of Health   Financial Resource Strain: Not on file  Food Insecurity: Not on file  Transportation Needs: Not on file  Physical Activity: Not on file  Stress: Not on file  Social Connections: Not on file   Review of systems General: negative for malaise, night sweats, fever, chills, weight loss Neck: Negative for lumps, goiter, pain and significant neck swelling Resp: Negative for cough, wheezing, dyspnea at rest CV: Negative for chest pain, leg swelling, palpitations, orthopnea GI: denies melena, hematochezia, nausea, vomiting, diarrhea, constipation,odyonophagia, early satiety or unintentional weight loss. +dysphagia +GERD  MSK: Negative for joint pain or swelling, back pain, and muscle pain. Derm: Negative for itching or rash Psych: Denies depression, anxiety,  memory loss, confusion. No homicidal or suicidal ideation.  Heme: Negative for prolonged bleeding, bruising easily, and swollen nodes. Endocrine: Negative for cold or heat intolerance, polyuria, polydipsia and goiter. Neuro: negative for tremor, gait imbalance, syncope and seizures. The remainder of the review of systems is noncontributory.  Physical Exam: BP (!) 121/50 (BP Location: Left Arm, Patient Position: Sitting, Cuff Size: Large)   Pulse 71   Temp 97.8 F (36.6 C) (Temporal)   Ht '5\' 3"'$  (1.6 m)   Wt 139 lb 6.4 oz (63.2 kg)   BMI 24.69 kg/m  General:   Alert and oriented. No distress noted. Pleasant and cooperative.  Head:  Normocephalic and atraumatic. Eyes:  Conjuctiva clear without scleral icterus. Mouth:  Oral mucosa pink and moist. Good dentition. No lesions. Heart: Normal rate and rhythm, s1 and s2 heart sounds present.  Lungs: Clear lung sounds in all lobes. Respirations equal and unlabored. Abdomen:  +BS, soft, non-tender and non-distended. No rebound or guarding. No HSM or masses noted. Derm: No palmar erythema or jaundice Msk:  Symmetrical without gross deformities. Normal posture. Extremities:  Without edema. Neurologic:  Alert and  oriented x4 Psych:  Alert and cooperative. Normal mood and affect.  Invalid input(s): "6 MONTHS"   ASSESSMENT: Katrina Castro is a 77 y.o. female presenting today for ongoing dysphagia/GERD.  Dysphagia:previous diagnosis of achalasia though more recent testing at [facility] in 2023 was negative for findings of achalasia, queried if botox injections prior to testing had helped in decreasing her dysmotility, she was not considered a candidate for POEM given these findings.  Last EGD was in January 2023 with normal esophagus was injected with Botox.  Patient presenting today with ongoing dysphagia, though mostly well-managed by Avoiding thicker foods up until Saturday night when she ate 2 small bites of chicken that got lodged in her mid chest,  requiring her to perform the Heimlich maneuver on herself to remove the food bolus that she could not get it to go down or come back up. Recommend continued chewing precautions, proceed with EGD +/- dilation with possible botox injection as she has had good results from this in the past. Indications, risks and benefits of procedure discussed in detail with patient. Patient verbalized understanding and is in agreement to proceed with EGD +/- dilation.   GERD: Maintained on Protonix 40 mg once daily, she reports some breakthrough symptoms requiring use of Tums and Mylanta though she cannot tell me how often this is happening.  She does not feel like Protonix is working well for her.  Will stop Protonix and start omeprazole 40 mg once daily.  Recommended to continue with reflux precautions to include avoiding  greasy, spicy, tomato-based, citrus based foods, caffeine, chocolate, alcohol, and to remain in an upright position for 2 to 3 hours after eating prior to laying down.   PLAN:  EGD +/- dilation/with possible botox injection -[facility] ENDO 1  2. Continue with chewing precautions  3. Stop protonix, start omeprazole '40mg'$  daily  4. Reflux precautions   All questions were answered, patient verbalized understanding and is in agreement with plan as outlined above.    Follow Up: 3 months   Lanelle Lindo L. Alver Sorrow, MSN, APRN, AGNP-C Adult-Gerontology Nurse Practitioner [facility]  I have reviewed the note and agree with the APP's assessment as described in this progress note  Maylon Peppers, MD Gastroenterology and Hepatology [facility]

## 2022-08-14 NOTE — Progress Notes (Unsigned)
Referring Provider: Glenda Chroman, MD Primary Care Physician:  Glenda Chroman, MD Primary GI Physician: Jenetta Downer   Chief Complaint  Patient presents with   Dysphagia    Patient here today due to having issues with dysphagia. Patient reports she got choked on her food on Saturday and had to do the upper thrust/ Hemileich maneuver on her self.   HPI:   Katrina Castro is a 77 y.o. female with past medical history of c. Difficile, coronary artery disease status post CABG, hypertension, GERD, type III achalasia, type 2 diabetes and depression   Patient presenting today for dysphagia.   Last seen in July 2022, at that time taking iron and vitamin B12 for anemia. having worsening bloating and has had a bowel every other day which is different from her usual, since taking iron. Taking a stool softener daily. Also having presented episodes of regurgitation and discomfort in her mid chest. for the last 6-8 months she has had dysphagia especially when eating bread or meat, which she has avoided to improve her symptoms.   noticed that more recently even regular soft food causes persistent and recurrent symptoms. She reports having significant choking spells when eating food in general -she is concerned that she may aspirate some of the food.  Occasionally she has some heartburn and belching which she does not think has improved with the PPI  Options of Heller myotomy, peroral endoscopic myotomy, balloon dilation with pneumatic balloon or Botox injection discussed with the patient in regards to her achalasia. Advised to start miralax, continue nexium '40mg'$  daily. Patient later decided to undergo EGD with botox injection.  EGD done in August 2022  She was referred to [facility] in December though continued to have dysphagia  Repeat EGD done January 2023, as patient continued to have symptoms, she was evaluated at [facility], investigations performed there did not show any changes highly consistent with  achalasia, suspected possible that Botox has helped decreasing the dysmotility but is unclear if she indeed had a diagnosis of achalasia.  Due to this, she was not considered a candidate for POEM. she was prescribed Levsin by Dr. Filbert Schilder to improve her symptoms.  She had some mild improvement with it.  was advised to continue to follow with Dr. Filbert Schilder, empiric dilations PRN.  Present:  Patient reports some ongoing dysphagia, she ate some chicken on Saturday night that she reportedly cut up very small, after about 2 bites, she felt food was stuck in mid chest, it would not go down or come up. She tried to drink water but it was also coming right back up. She eventually had to perform the heimleich maneuver on herself as she lives alone, this was eventually successful and she was able to get the food up. She notes she tries to cut her food very small, avoids meats for the most part. She typically will stop eating once foods feel like they are not wanting to go down. She denies any other episodes of food impaction. She is no longer taking levsin she was previously prescribed at [facility], does not recall taking this in the past.  She does note she coughed up a small amount of blood after she performed the heimleich maneuver but no further episodes of this.    She is on pantoprazole for GERD. She tries to avoid trigger foods though still having some issues with heartburn. She takes tums and mylanta though cannot tell me how often she is needing this or having symptoms.  esophageal manometry-[facility] on February 2017 which showed findings consistent with type III achalasia as her IRP was 22 with presence of a spastic esophagus. She had previously responded to the use of Botox  Last Colonoscopy:2020 - The examined portion of the ileum was normal.                           - One small polyp in the proximal sigmoid colon.                            Biopsied-TA                            - External  hemorrhoids. Last Endoscopy:06/2021 - Normal esophagus. Injected.                           - Normal stomach.                           - Normal examined duodenum.                           - No specimens collected.  Recommendations:  TCS due 2027, health permitting   Past Medical History:  Diagnosis Date   Arthritis    CAD (coronary artery disease)    a. s/p CABG on 04/27/2020 with LIMA-LAD, seq SVG-PDA-PL, seq left radial-RI-OM   Concussion    2015   Depression    Diabetes ([place])    type 2   Dysphagia    Dysrhythmia    afib   GERD (gastroesophageal reflux disease)    Headache    History of bronchitis    Hypertension    Seasonal allergies    Toenail fungus     Past Surgical History:  Procedure Laterality Date   APPENDECTOMY     BACK SURGERY     x2   BOTOX INJECTION N/A 06/06/2016   Procedure: BOTOX INJECTION;  Surgeon: Rogene Houston, MD;  [facility];  Service: Endoscopy;  Laterality: N/A;   BOTOX INJECTION N/A 01/17/2021   Procedure: BOTOX INJECTION;  Surgeon: Harvel Quale, MD;  [facility];  Service: Gastroenterology;  Laterality: N/A;   BOTOX INJECTION N/A 07/11/2021   Procedure: BOTOX INJECTION;  Surgeon: Harvel Quale, MD;  [facility];  Service: Gastroenterology;  Laterality: N/A;   CHOLECYSTECTOMY     COLONOSCOPY N/A 11/05/2012   Procedure: COLONOSCOPY;  Surgeon: Rogene Houston, MD;  [facility];  Service: Endoscopy;  Laterality: N/A;  1030   COLONOSCOPY WITH PROPOFOL N/A 05/01/2019   Procedure: COLONOSCOPY WITH PROPOFOL;  Surgeon: Rogene Houston, MD;  [facility];  Service: Endoscopy;  Laterality: N/A;  11:20am   CORONARY ARTERY BYPASS GRAFT N/A 04/27/2020   Procedure: CORONARY ARTERY BYPASS GRAFTING (CABG) TIMES FIVE USING LEFT INTERNAL MAMMARY ARTERY, LEFT HARVESTED RADIAL ARTERY, RIGHT GREATER SAPHENOUS VEIN HARVESTED ENDOSCOPICALLY.;  Surgeon: [name], MD;   Location: [place];  Service: Open Heart Surgery;  Laterality: N/A;   ESOPHAGEAL DILATION N/A 07/06/2015   Procedure: ESOPHAGEAL DILATION;  Surgeon: Rogene Houston, MD;  [facility];  Service: Endoscopy;  Laterality: N/A;   ESOPHAGEAL DILATION N/A 06/06/2016   Procedure:  ESOPHAGEAL DILATION;  Surgeon: Rogene Houston, MD;  [facility];  Service: Endoscopy;  Laterality: N/A;   ESOPHAGOGASTRODUODENOSCOPY N/A 02/12/2014   Procedure: ESOPHAGOGASTRODUODENOSCOPY (EGD);  Surgeon: Rogene Houston, MD;  [facility];  Service: Endoscopy;  Laterality: N/A;  150   ESOPHAGOGASTRODUODENOSCOPY N/A 07/06/2015   Procedure: ESOPHAGOGASTRODUODENOSCOPY (EGD);  Surgeon: Rogene Houston, MD;  [facility];  Service: Endoscopy;  Laterality: N/A;  1:25 - moved to 1/18 @ 10:30 - Ann to notify pt   ESOPHAGOGASTRODUODENOSCOPY (EGD) WITH ESOPHAGEAL DILATION N/A 07/25/2012   Procedure: ESOPHAGOGASTRODUODENOSCOPY (EGD) WITH ESOPHAGEAL DILATION;  Surgeon: Rogene Houston, MD;  [facility];  Service: Endoscopy;  Laterality: N/A;  325-rescheduled to [place] notified pt   ESOPHAGOGASTRODUODENOSCOPY (EGD) WITH PROPOFOL N/A 06/06/2016   Procedure: ESOPHAGOGASTRODUODENOSCOPY (EGD) WITH PROPOFOL;  Surgeon: Rogene Houston, MD;  [facility];  Service: Endoscopy;  Laterality: N/A;   ESOPHAGOGASTRODUODENOSCOPY (EGD) WITH PROPOFOL N/A 01/17/2021   Procedure: ESOPHAGOGASTRODUODENOSCOPY (EGD) WITH PROPOFOL;  Surgeon: Harvel Quale, MD;  [facility];  Service: Gastroenterology;  Laterality: N/A;  10:35   ESOPHAGOGASTRODUODENOSCOPY (EGD) WITH PROPOFOL N/A 07/11/2021   Procedure: ESOPHAGOGASTRODUODENOSCOPY (EGD) WITH PROPOFOL;  Surgeon: Harvel Quale, MD;  [facility];  Service: Gastroenterology;  Laterality: N/A;  11:15   EYE SURGERY     cataracts removed   Foot surgeries Bilateral    hammer toes   LEFT HEART CATH AND CORONARY  ANGIOGRAPHY N/A 04/26/2020   Procedure: LEFT HEART CATH AND CORONARY ANGIOGRAPHY;  Surgeon: Martinique, Peter M, MD;  Location: [place] [facility];  Service: Cardiovascular;  Laterality: N/A;   MALONEY DILATION N/A 02/12/2014   Procedure: Venia Minks DILATION;  Surgeon: Rogene Houston, MD;  [facility];  Service: Endoscopy;  Laterality: N/A;   POLYPECTOMY  05/01/2019   Procedure: POLYPECTOMY;  Surgeon: Rogene Houston, MD;  [facility];  Service: Endoscopy;;  colon    RADIAL ARTERY HARVEST Left 04/27/2020   Procedure: LEFT RADIAL ARTERY HARVEST;  Surgeon: [name], MD;  Location: [place];  Service: Open Heart Surgery;  Laterality: Left;   Right knee arthroscopy     x2   TEE WITHOUT CARDIOVERSION N/A 04/27/2020   Procedure: TRANSESOPHAGEAL ECHOCARDIOGRAM (TEE);  Surgeon: [name], MD;  Location: [place];  Service: Open Heart Surgery;  Laterality: N/A;   TONSILLECTOMY     TOTAL ABDOMINAL HYSTERECTOMY     TOTAL KNEE ARTHROPLASTY Right 04/03/2017   Procedure: RIGHT TOTAL KNEE ARTHROPLASTY;  Surgeon: Latanya Maudlin, MD;  [facility];  Service: Orthopedics;  Laterality: Right;    Current Outpatient Medications  Medication Sig Dispense Refill   acetaminophen (TYLENOL) 650 MG CR tablet Take 650 mg by mouth every 8 (eight) hours as needed for pain.     acyclovir (ZOVIRAX) 400 MG tablet Take 400 mg by mouth daily.     ALPRAZolam (XANAX) 0.5 MG tablet Take 0.25 mg by mouth at bedtime as needed for anxiety or sleep.     aspirin 81 MG EC tablet Take 1 tablet (81 mg total) by mouth daily. Swallow whole. 30 tablet 11   Biotin w/ Vitamins C & E (HAIR SKIN & NAILS GUMMIES PO) Take 2 capsules by mouth daily.     Cholecalciferol (VITAMIN D) 50 MCG (2000 UT) tablet Take 2,000 Units by mouth daily.     clotrimazole (LOTRIMIN) 1 % cream Apply 1 application topically 2 (two)  times daily as needed (lip cracking).     clotrimazole-betamethasone (LOTRISONE) cream Apply 1  application topically 2 (two) times daily. 30 g 0   Cyanocobalamin (B-12 COMPLIANCE INJECTION IJ) Inject 1 Dose as directed every 30 (thirty) days.     Cyanocobalamin (B-12 PO) Take 1 tablet by mouth daily.     diclofenac Sodium (VOLTAREN) 1 % GEL Apply 1 application topically 4 (four) times daily as needed (pain).     empagliflozin (JARDIANCE) 25 MG TABS tablet Take 1 tablet (25 mg total) by mouth daily before breakfast. 90 tablet 1   ezetimibe (ZETIA) 10 MG tablet TAKE 1 TABLET ONCE DAILY. 90 tablet 3   glipiZIDE (GLUCOTROL XL) 2.5 MG 24 hr tablet Take 1 tablet (2.5 mg total) by mouth daily with breakfast. 90 tablet 3   LANTUS SOLOSTAR 100 UNIT/ML Solostar Pen Inject into the skin.     levocetirizine (XYZAL) 5 MG tablet Take 5 mg by mouth daily.     Menthol-Methyl Salicylate (MUSCLE RUB EX) Apply 1 application topically daily as needed (muscle pain). [place] gel     metFORMIN (GLUCOPHAGE-XR) 500 MG 24 hr tablet Take 1 tablet (500 mg total) by mouth 2 (two) times daily with a meal. (Patient taking differently: Take 1,000 mg by mouth 2 (two) times daily with a meal.) 180 tablet 3   metoprolol tartrate (LOPRESSOR) 25 MG tablet TAKE (1/2) TABLET BY MOUTH 2 TIMES A DAY. 30 tablet 6   Multiple Vitamins-Minerals (ADULT GUMMY PO) Take 2 capsules by mouth daily.     nitroGLYCERIN (NITROSTAT) 0.4 MG SL tablet Place 1 tablet (0.4 mg total) under the tongue every 5 (five) minutes as needed for chest pain. 25 tablet 3   oxybutynin (DITROPAN-XL) 10 MG 24 hr tablet Take 10 mg by mouth daily.     pantoprazole (PROTONIX) 40 MG tablet Take 40 mg by mouth daily.     rosuvastatin (CRESTOR) 40 MG tablet TAKE 1 TABLET BY MOUTH ONCE DAILY. 90 tablet 3   dicyclomine (BENTYL) 10 MG capsule Take 1 capsule (10 mg total) by mouth every 12 (twelve) hours as needed for spasms (abdominal pain). (Patient not taking: Reported on 08/14/2022) 30 capsule 0   No current facility-administered medications for this visit.    Allergies  as of 08/14/2022 - Review Complete 08/14/2022  Allergen Reaction Noted   Levofloxacin Anaphylaxis and Rash 04/03/2017   Cardizem [diltiazem] Other (See Comments) 08/17/2015   Sulfa antibiotics Itching 07/13/2014   Diazepam Nausea Only 04/03/2017   Lipitor [atorvastatin] Other (See Comments) 10/12/2019    Family History  Problem Relation Age of Onset   Diabetes Mother    Stroke Father    Diabetes Sister    Colon cancer Neg Hx    Breast cancer Neg Hx     Social History   Socioeconomic History   Marital status: Divorced    Spouse name: Not on file   Number of children: Not on file   Years of education: Not on file   Highest education level: Not on file  Occupational History   Not on file  Tobacco Use   Smoking status: Never   Smokeless tobacco: Never  Vaping Use   Vaping Use: Never used  Substance and Sexual Activity   Alcohol use: No    Alcohol/week: 0.0 standard drinks of alcohol    Comment: socially    Drug use: No   Sexual activity: Not Currently    Partners: Male    Birth control/protection: None  Other Topics Concern   Not on file  Social History Narrative   Not on file   Social Determinants of Health   Financial Resource Strain: Not on file  Food Insecurity: Not on file  Transportation Needs: Not on file  Physical Activity: Not on file  Stress: Not on file  Social Connections: Not on file   Review of systems General: negative for malaise, night sweats, fever, chills, weight loss Neck: Negative for lumps, goiter, pain and significant neck swelling Resp: Negative for cough, wheezing, dyspnea at rest CV: Negative for chest pain, leg swelling, palpitations, orthopnea GI: denies melena, hematochezia, nausea, vomiting, diarrhea, constipation,odyonophagia, early satiety or unintentional weight loss. +dysphagia +GERD  MSK: Negative for joint pain or swelling, back pain, and muscle pain. Derm: Negative for itching or rash Psych: Denies depression, anxiety,  memory loss, confusion. No homicidal or suicidal ideation.  Heme: Negative for prolonged bleeding, bruising easily, and swollen nodes. Endocrine: Negative for cold or heat intolerance, polyuria, polydipsia and goiter. Neuro: negative for tremor, gait imbalance, syncope and seizures. The remainder of the review of systems is noncontributory.  Physical Exam: BP (!) 121/50 (BP Location: Left Arm, Patient Position: Sitting, Cuff Size: Large)   Pulse 71   Temp 97.8 F (36.6 C) (Temporal)   Ht '5\' 3"'$  (1.6 m)   Wt 139 lb 6.4 oz (63.2 kg)   BMI 24.69 kg/m  General:   Alert and oriented. No distress noted. Pleasant and cooperative.  Head:  Normocephalic and atraumatic. Eyes:  Conjuctiva clear without scleral icterus. Mouth:  Oral mucosa pink and moist. Good dentition. No lesions. Heart: Normal rate and rhythm, s1 and s2 heart sounds present.  Lungs: Clear lung sounds in all lobes. Respirations equal and unlabored. Abdomen:  +BS, soft, non-tender and non-distended. No rebound or guarding. No HSM or masses noted. Derm: No palmar erythema or jaundice Msk:  Symmetrical without gross deformities. Normal posture. Extremities:  Without edema. Neurologic:  Alert and  oriented x4 Psych:  Alert and cooperative. Normal mood and affect.  Invalid input(s): "6 MONTHS"   ASSESSMENT: Katrina Castro is a 77 y.o. female presenting today for ongoing dysphagia/GERD.  Dysphagia:previous diagnosis of achalasia though more recent testing at [facility] in 2023 was negative for findings of achalasia, queried if botox injections prior to testing had helped in decreasing her dysmotility, she was not considered a candidate for POEM given these findings.  Last EGD was in January 2023 with normal esophagus was injected with Botox.  Patient presenting today with ongoing dysphagia, though mostly well-managed by Avoiding thicker foods up until Saturday night when she ate 2 small bites of chicken that got lodged in her mid chest,  requiring her to perform the Heimlich maneuver on herself to remove the food bolus that she could not get it to go down or come back up. Recommend continued chewing precautions, proceed with EGD +/- dilation with possible botox injection as she has had good results from this in the past. Indications, risks and benefits of procedure discussed in detail with patient. Patient verbalized understanding and is in agreement to proceed with EGD +/- dilation.   GERD: Maintained on Protonix 40 mg once daily, she reports some breakthrough symptoms requiring use of Tums and Mylanta though she cannot tell me how often this is happening.  She does not feel like Protonix is working well for her.  Will stop Protonix and start omeprazole 40 mg once daily.  Recommended to continue with reflux precautions to include avoiding  greasy, spicy, tomato-based, citrus based foods, caffeine, chocolate, alcohol, and to remain in an upright position for 2 to 3 hours after eating prior to laying down.   PLAN:  EGD +/- dilation/with possible botox injection -[facility] END[facility] 1  2. Continue with chewing precautions  3. Stop protonix, start omeprazole '40mg'$  daily  4. Reflux precautions   All questions were answered, patient verbalized understanding and is in agreement with plan as outlined above.    Follow Up: 3 months   Savon Cobbs L. Alver Sorrow, MSN, APRN, AGNP-C Adult-Gerontology Nurse Practitioner [facility]  I have reviewed the note and agree with the APP's assessment as described in this progress note  Maylon Peppers, MD Gastroenterology and Hepatology [facility]astroenter[facility]

## 2022-08-15 ENCOUNTER — Telehealth (INDEPENDENT_AMBULATORY_CARE_PROVIDER_SITE_OTHER): Payer: Self-pay | Admitting: Gastroenterology

## 2022-08-15 ENCOUNTER — Telehealth (INDEPENDENT_AMBULATORY_CARE_PROVIDER_SITE_OTHER): Payer: Self-pay | Admitting: *Deleted

## 2022-08-15 NOTE — Telephone Encounter (Signed)
Spoke with pt this morning and scheduled her. See other phone note in chart. Thanks!

## 2022-08-15 NOTE — Telephone Encounter (Signed)
Patient keeps calling about scheduling her procedure - please advise - ph# [phone]

## 2022-08-15 NOTE — Addendum Note (Signed)
Addended by: Harvel Quale on: 08/15/2022 03:46 PM   Modules accepted: Level of Service

## 2022-08-15 NOTE — Telephone Encounter (Signed)
Called pt. Scheduled for EGD +/-DIL, +/- botox injections 3/5 at 11:15am. Aware of instructions as discussed in detail. She is aware needs to hold jardiance x 3 days. Aware will get pre-op phone call before procedure. Message sent to [facility] making aware of possible botox.

## 2022-08-17 DIAGNOSIS — E538 Deficiency of other specified B group vitamins: Secondary | ICD-10-CM | POA: Diagnosis not present

## 2022-08-17 DIAGNOSIS — E1140 Type 2 diabetes mellitus with diabetic neuropathy, unspecified: Secondary | ICD-10-CM | POA: Diagnosis not present

## 2022-08-17 DIAGNOSIS — Z299 Encounter for prophylactic measures, unspecified: Secondary | ICD-10-CM | POA: Diagnosis not present

## 2022-08-17 DIAGNOSIS — M5441 Lumbago with sciatica, right side: Secondary | ICD-10-CM | POA: Diagnosis not present

## 2022-08-17 DIAGNOSIS — I10 Essential (primary) hypertension: Secondary | ICD-10-CM | POA: Diagnosis not present

## 2022-08-20 ENCOUNTER — Encounter (HOSPITAL_COMMUNITY)
Admission: RE | Admit: 2022-08-20 | Discharge: 2022-08-20 | Disposition: A | Discharge status: Home / Self Care | Payer: Medicare Other | Source: Ambulatory Visit | Attending: Gastroenterology | Admitting: Gastroenterology

## 2022-08-21 ENCOUNTER — Ambulatory Visit (HOSPITAL_COMMUNITY): Payer: Medicare Other | Admitting: Registered Nurse

## 2022-08-21 ENCOUNTER — Ambulatory Visit (HOSPITAL_COMMUNITY)
Admission: RE | Admit: 2022-08-21 | Discharge: 2022-08-21 | Disposition: A | Discharge status: Home / Self Care | Payer: Medicare Other | Attending: Gastroenterology | Admitting: Gastroenterology

## 2022-08-21 ENCOUNTER — Ambulatory Visit (HOSPITAL_BASED_OUTPATIENT_CLINIC_OR_DEPARTMENT_OTHER): Payer: Medicare Other | Admitting: Registered Nurse

## 2022-08-21 ENCOUNTER — Encounter (HOSPITAL_COMMUNITY)
Admission: RE | Disposition: A | Discharge status: Home / Self Care | Payer: Self-pay | Source: Home / Self Care | Attending: Gastroenterology

## 2022-08-21 DIAGNOSIS — R519 Headache, unspecified: Secondary | ICD-10-CM | POA: Diagnosis not present

## 2022-08-21 DIAGNOSIS — F32A Depression, unspecified: Secondary | ICD-10-CM | POA: Insufficient documentation

## 2022-08-21 DIAGNOSIS — Z7984 Long term (current) use of oral hypoglycemic drugs: Secondary | ICD-10-CM

## 2022-08-21 DIAGNOSIS — R1310 Dysphagia, unspecified: Secondary | ICD-10-CM

## 2022-08-21 DIAGNOSIS — Z8719 Personal history of other diseases of the digestive system: Secondary | ICD-10-CM | POA: Diagnosis not present

## 2022-08-21 DIAGNOSIS — Z951 Presence of aortocoronary bypass graft: Secondary | ICD-10-CM | POA: Diagnosis not present

## 2022-08-21 DIAGNOSIS — K220 Achalasia of cardia: Secondary | ICD-10-CM | POA: Diagnosis not present

## 2022-08-21 DIAGNOSIS — I25119 Atherosclerotic heart disease of native coronary artery with unspecified angina pectoris: Secondary | ICD-10-CM

## 2022-08-21 DIAGNOSIS — I10 Essential (primary) hypertension: Secondary | ICD-10-CM | POA: Insufficient documentation

## 2022-08-21 DIAGNOSIS — K219 Gastro-esophageal reflux disease without esophagitis: Secondary | ICD-10-CM | POA: Insufficient documentation

## 2022-08-21 DIAGNOSIS — E119 Type 2 diabetes mellitus without complications: Secondary | ICD-10-CM | POA: Insufficient documentation

## 2022-08-21 HISTORY — PX: BOTOX INJECTION: SHX5754

## 2022-08-21 HISTORY — PX: ESOPHAGEAL DILATION: SHX303

## 2022-08-21 HISTORY — PX: ESOPHAGOGASTRODUODENOSCOPY (EGD) WITH PROPOFOL: SHX5813

## 2022-08-21 LAB — GLUCOSE, CAPILLARY: Glucose-Capillary: 113 mg/dL — ABNORMAL HIGH (ref 70–99)

## 2022-08-21 SURGERY — ESOPHAGOGASTRODUODENOSCOPY (EGD) WITH PROPOFOL
Anesthesia: General

## 2022-08-21 MED ORDER — PHENYLEPHRINE HCL (PRESSORS) 10 MG/ML IV SOLN
INTRAVENOUS | Status: DC | PRN
  Administered 2022-08-21: 100 ug via INTRAVENOUS
  Administered 2022-08-21: 10 ug via INTRAVENOUS

## 2022-08-21 MED ORDER — LACTATED RINGERS IV SOLN: INTRAVENOUS | Status: DC | PRN

## 2022-08-21 MED ORDER — ONABOTULINUMTOXINA 100 UNITS IJ SOLR
100.0000 [IU] | INTRAMUSCULAR | Status: AC
  Administered 2022-08-21: 100 [IU] via INTRAMUSCULAR
  Filled 2022-08-21: qty 100

## 2022-08-21 MED ORDER — PROPOFOL 10 MG/ML IV BOLUS
INTRAVENOUS | Status: DC | PRN
  Administered 2022-08-21 (×2): 50 mg via INTRAVENOUS
  Administered 2022-08-21: 20 mg via INTRAVENOUS

## 2022-08-21 NOTE — Interval H&P Note (Signed)
History and Physical Interval Note:  08/21/2022 10:01 AM  Katrina Castro  has presented today for surgery, with the diagnosis of DYSPHAGIA.  The various methods of treatment have been discussed with the patient and family. After consideration of risks, benefits and other options for treatment, the patient has consented to  Procedure(s) with comments: ESOPHAGOGASTRODUODENOSCOPY (EGD) WITH PROPOFOL (N/A) - 1115AM, ASA 2 ESOPHAGEAL DILATION (N/A) BOTOX INJECTION (N/A) as a surgical intervention.  The patient's history has been reviewed, patient examined, no change in status, stable for surgery.  I have reviewed the patient's chart and labs.  Questions were answered to the patient's satisfaction.     [name]

## 2022-08-21 NOTE — Op Note (Signed)
[facility] Patient Name: Katrina Castro Procedure Date: 08/21/2022 11:06 AM MRN: [id number] Date of Birth: 26-Feb-1946 Attending MD: Maylon Peppers , , [id number] CSN: [id number] Age: 77 Admit Type: Outpatient Procedure:                Upper GI endoscopy Indications:              Dysphagia, History of achalasia requiring Botox                            injection Providers:                Maylon Peppers, Crystal Page, Raphael Gibney,                            Technician Referring MD:              Medicines:                Monitored Anesthesia Care Complications:            No immediate complications. Estimated Blood Loss:     Estimated blood loss: none. Procedure:                Pre-Anesthesia Assessment:                           - Prior to the procedure, a History and Physical                            was performed, and patient medications, allergies                            and sensitivities were reviewed. The patient's                            tolerance of previous anesthesia was reviewed.                           - The risks and benefits of the procedure and the                            sedation options and risks were discussed with the                            patient. All questions were answered and informed                            consent was obtained.                           - ASA Grade Assessment: III - A patient with severe                            systemic disease.                           After obtaining informed consent, the endoscope was  passed under direct vision. Throughout the                            procedure, the patient's blood pressure, pulse, and                            oxygen saturations were monitored continuously. The                            [id number] scope was introduced through the                            mouth, and advanced to the second part of duodenum.                             The upper GI endoscopy was accomplished without                            difficulty. The patient tolerated the procedure                            well. Scope In: 11:21:09 AM Scope Out: 11:34:10 AM Total Procedure Duration: 0 hours 13 minutes 1 second  Findings:      The examined esophagus was normal. A guidewire was placed and the scope       was withdrawn. Dilation was performed with a Savary dilator with       moderate resistance at 20 mm. GE junction was successfully injected with       100 units botulinum toxin (25 U in each quadrant).      The stomach was normal.      The examined duodenum was normal. Impression:               - Normal esophagus. Dilated. Injected with                            botulinum toxin.                           - Normal stomach.                           - Normal examined duodenum.                           - No specimens collected. Moderate Sedation:      Per Anesthesia Care Recommendation:           - Discharge patient to home (ambulatory).                           - Advance diet as tolerated.                           - If worsening swallowing issues, will need to  refer back to [facility]. Procedure Code(s):        --- Professional ---                           [phone], Esophagogastroduodenoscopy, flexible,                            transoral; with insertion of guide wire followed by                            passage of dilator(s) through esophagus over guide                            wire                           [id number], 59, Esophagogastroduodenoscopy, flexible,                            transoral; with directed submucosal injection(s),                            any substance Diagnosis Code(s):        --- Professional ---                           R13.10, Dysphagia, unspecified CPT copyright 2022 American Medical Association. All rights reserved. The codes documented in this report are preliminary and upon  coder review may  be revised to meet current compliance requirements. Maylon Peppers, MD Maylon Peppers,  08/21/2022 11:49:03 AM This report has been signed electronically. Number of Addenda: 0

## 2022-08-21 NOTE — Discharge Instructions (Addendum)
You are being discharged to home.  Advance your diet as tolerated.  If worsening swallowing issues, will need to refer back to [facility].

## 2022-08-21 NOTE — Anesthesia Preprocedure Evaluation (Signed)
Anesthesia Evaluation  Patient identified by MRN, date of birth, ID band Patient awake    Reviewed: Allergy & Precautions, H&P , NPO status , Patient's Chart, lab work & pertinent test results, reviewed documented beta blocker date and time   Airway Mallampati: II  TM Distance: >3 FB Neck ROM: full    Dental no notable dental hx.    Pulmonary neg pulmonary ROS   Pulmonary exam normal breath sounds clear to auscultation       Cardiovascular Exercise Tolerance: Good hypertension, + angina  + CAD and + CABG  + dysrhythmias  Rhythm:regular Rate:Normal     Neuro/Psych  Headaches PSYCHIATRIC DISORDERS  Depression    negative neurological ROS  negative psych ROS   GI/Hepatic negative GI ROS, Neg liver ROS,GERD  ,,  Endo/Other  negative endocrine ROSdiabetes    Renal/GU negative Renal ROS  negative genitourinary   Musculoskeletal   Abdominal   Peds  Hematology negative hematology ROS (+)   Anesthesia Other Findings Stye - left eye  Reproductive/Obstetrics negative OB ROS                             Anesthesia Physical Anesthesia Plan  ASA: 3  Anesthesia Plan: General   Post-op Pain Management:    Induction:   PONV Risk Score and Plan: Propofol infusion  Airway Management Planned:   Additional Equipment:   Intra-op Plan:   Post-operative Plan:   Informed Consent: I have reviewed the patients History and Physical, chart, labs and discussed the procedure including the risks, benefits and alternatives for the proposed anesthesia with the patient or authorized representative who has indicated his/her understanding and acceptance.     Dental Advisory Given  Plan Discussed with: CRNA  Anesthesia Plan Comments:        Anesthesia Quick Evaluation

## 2022-08-21 NOTE — Anesthesia Postprocedure Evaluation (Signed)
Anesthesia Post Note  Patient: Katrina Castro  Procedure(s) Performed: ESOPHAGOGASTRODUODENOSCOPY (EGD) WITH PROPOFOL ESOPHAGEAL DILATION BOTOX INJECTION  Anesthesia Type: General Anesthetic complications: no   No notable events documented.   Last Vitals:  Vitals:   08/21/22 1025 08/21/22 1141  BP: (!) 159/84 125/68  Pulse: 81 88  Resp: 19 16  Temp: 36.8 C 36.7 C  SpO2: 100% 98%    Last Pain:  Vitals:   08/21/22 1141  TempSrc: Oral  PainSc: 0-No pain                 Sharee Pimple

## 2022-08-21 NOTE — Transfer of Care (Signed)
Immediate Anesthesia Transfer of Care Note  Patient: Katrina Castro  Procedure(s) Performed: ESOPHAGOGASTRODUODENOSCOPY (EGD) WITH PROPOFOL ESOPHAGEAL DILATION BOTOX INJECTION  Patient Location: PACU  Anesthesia Type:MAC  Level of Consciousness: awake, alert , and oriented  Airway & Oxygen Therapy: Patient Spontanous Breathing  Post-op Assessment: Report given to RN and Post -op Vital signs reviewed and stable  Post vital signs: Reviewed  Last Vitals:  Vitals Value Taken Time  BP 125/68 08/21/22 1141  Temp 36.7 C 08/21/22 1141  Pulse 88 08/21/22 1141  Resp 16 08/21/22 1141  SpO2 98 % 08/21/22 1141    Last Pain:  Vitals:   08/21/22 1141  TempSrc: Oral  PainSc: 0-No pain         Complications: No notable events documented.

## 2022-08-22 ENCOUNTER — Other Ambulatory Visit (INDEPENDENT_AMBULATORY_CARE_PROVIDER_SITE_OTHER): Payer: Self-pay | Admitting: Gastroenterology

## 2022-08-22 ENCOUNTER — Telehealth (INDEPENDENT_AMBULATORY_CARE_PROVIDER_SITE_OTHER): Payer: Self-pay | Admitting: *Deleted

## 2022-08-22 ENCOUNTER — Telehealth (INDEPENDENT_AMBULATORY_CARE_PROVIDER_SITE_OTHER): Payer: Self-pay

## 2022-08-22 DIAGNOSIS — R140 Abdominal distension (gaseous): Secondary | ICD-10-CM

## 2022-08-22 MED ORDER — DICYCLOMINE HCL 10 MG PO CAPS: 10.0000 mg | ORAL_CAPSULE | Freq: Two times a day (BID) | ORAL | 0 refills | Status: DC | PRN

## 2022-08-22 NOTE — Telephone Encounter (Signed)
Sent dicyclomine to pharmacy for bloating and stomach cramping. Chest pain is expected after dilation.

## 2022-08-22 NOTE — Telephone Encounter (Signed)
I spoke with the patient and she says she is sore from the [place], from yesterday and she is having a lot of gas and stomach cramping. I advised she may take Tylenol prn and she wanted to know if we could refill the dicyclomine 10 mg bid, if so please send to [name] in [place].

## 2022-08-22 NOTE — Telephone Encounter (Signed)
Barbara from [facility] called, patient has called therr and stated her chest was sore today and wanted the doctor to call her, she had EGD yesterda

## 2022-08-23 NOTE — Telephone Encounter (Signed)
Patient made aware of all.  

## 2022-08-31 ENCOUNTER — Encounter (HOSPITAL_COMMUNITY): Payer: Self-pay | Admitting: Gastroenterology

## 2022-08-31 ENCOUNTER — Telehealth: Payer: Self-pay | Admitting: Internal Medicine

## 2022-08-31 ENCOUNTER — Other Ambulatory Visit: Payer: Self-pay | Admitting: Internal Medicine

## 2022-08-31 ENCOUNTER — Telehealth: Payer: Self-pay

## 2022-08-31 DIAGNOSIS — E1165 Type 2 diabetes mellitus with hyperglycemia: Secondary | ICD-10-CM

## 2022-08-31 MED ORDER — GLIPIZIDE ER 2.5 MG PO TB24: 2.5000 mg | ORAL_TABLET | Freq: Every day | ORAL | 3 refills | Status: DC

## 2022-08-31 NOTE — Telephone Encounter (Signed)
Pt called to advise she has not been feeling well and her home health nurse advised her she is taking too much medication.  Pt contacted and she advised she has been taking Metformin (2) 500 mg tablets, Jardiance 25 mg tablet and Glipizide (3) 2.5 mg tablets daily for her blood sugar. Pt was advised per her instructions after her recent visit with Dr Cruzita Lederer she was advised: Please use the following regimen: - Metformin ER 500 mg 2x a day - Glipizide ER 2.5 mg before breakfast - Jardiance 25 mg before breakfast   Please use: - Lantus 6 units in am   Try to check blood sugars 1x a day, rotating check times.   Please return in 3-4 months with your sugar log.  Phone disconnected and attempted to reconnect with pt. Mychart message sent to pt with instructions.  Pt contacted office and was given instructions. She requested rx be sent to [place] to confirm dosage with pharmacy. Pt says she is always cold and just not feeling well. She also would like something to help with her diet. Also would like a referral with a dietician/ nutritionist. Pharmacy contacted and rx was sent from another dr for pt to take Glipizide ER (2) 2.5 mg tablets in the morning and (1) 2.5 mg tablet in the evening. Advised that rx is incorrect and new rx sent over.

## 2022-08-31 NOTE — Telephone Encounter (Signed)
Patient was advised to contact Endocrinologist office to discuss these specific medications since endocrinology prescribed medications. Patient verbalized understanding.

## 2022-08-31 NOTE — Telephone Encounter (Signed)
Pt c/o medication issue:  1. Name of Medication: glipiZIDE (GLUCOTROL XL) 2.5 MG 24 hr tablet  metFORMIN (GLUCOPHAGE) 1000 MG tablet  empagliflozin (JARDIANCE) 25 MG TABS tablet   2. How are you currently taking this medication (dosage and times per day)? As prescribed   3. Are you having a reaction (difficulty breathing--STAT)? Yes  4. What is your medication issue? Patient states she just doesn't feel right and believes her medication may be off and may be taking more than she needs. Please advise.

## 2022-08-31 NOTE — Telephone Encounter (Signed)
T, Agree with the glipizide correction. Also, I put the referral in for her to see Mickel Baas. I am not sure why she is feeling poorly.  She may need to contact PCP and may need to visit if she has not seen them already. Ty! C

## 2022-09-07 DIAGNOSIS — M5432 Sciatica, left side: Secondary | ICD-10-CM | POA: Diagnosis not present

## 2022-09-07 DIAGNOSIS — I10 Essential (primary) hypertension: Secondary | ICD-10-CM | POA: Diagnosis not present

## 2022-09-07 DIAGNOSIS — M5430 Sciatica, unspecified side: Secondary | ICD-10-CM | POA: Diagnosis not present

## 2022-09-07 DIAGNOSIS — I700 Atherosclerosis of aorta: Secondary | ICD-10-CM | POA: Diagnosis not present

## 2022-09-07 DIAGNOSIS — Z299 Encounter for prophylactic measures, unspecified: Secondary | ICD-10-CM | POA: Diagnosis not present

## 2022-09-07 DIAGNOSIS — I4891 Unspecified atrial fibrillation: Secondary | ICD-10-CM | POA: Diagnosis not present

## 2022-09-12 DIAGNOSIS — M25561 Pain in right knee: Secondary | ICD-10-CM | POA: Diagnosis not present

## 2022-09-12 DIAGNOSIS — M545 Low back pain, unspecified: Secondary | ICD-10-CM | POA: Diagnosis not present

## 2022-09-12 DIAGNOSIS — M6281 Muscle weakness (generalized): Secondary | ICD-10-CM | POA: Diagnosis not present

## 2022-09-17 DIAGNOSIS — M47816 Spondylosis without myelopathy or radiculopathy, lumbar region: Secondary | ICD-10-CM | POA: Diagnosis not present

## 2022-09-17 DIAGNOSIS — M546 Pain in thoracic spine: Secondary | ICD-10-CM | POA: Diagnosis not present

## 2022-09-17 DIAGNOSIS — M9903 Segmental and somatic dysfunction of lumbar region: Secondary | ICD-10-CM | POA: Diagnosis not present

## 2022-09-17 DIAGNOSIS — M47812 Spondylosis without myelopathy or radiculopathy, cervical region: Secondary | ICD-10-CM | POA: Diagnosis not present

## 2022-09-17 DIAGNOSIS — M9902 Segmental and somatic dysfunction of thoracic region: Secondary | ICD-10-CM | POA: Diagnosis not present

## 2022-09-17 DIAGNOSIS — S233XXA Sprain of ligaments of thoracic spine, initial encounter: Secondary | ICD-10-CM | POA: Diagnosis not present

## 2022-09-17 DIAGNOSIS — S338XXA Sprain of other parts of lumbar spine and pelvis, initial encounter: Secondary | ICD-10-CM | POA: Diagnosis not present

## 2022-09-17 DIAGNOSIS — M9901 Segmental and somatic dysfunction of cervical region: Secondary | ICD-10-CM | POA: Diagnosis not present

## 2022-09-20 ENCOUNTER — Ambulatory Visit: Payer: Medicare Other | Admitting: Podiatry

## 2022-09-21 ENCOUNTER — Telehealth: Payer: Self-pay

## 2022-09-21 DIAGNOSIS — E538 Deficiency of other specified B group vitamins: Secondary | ICD-10-CM | POA: Diagnosis not present

## 2022-09-21 NOTE — Telephone Encounter (Signed)
Clinical notes faxed through Epic to [facility] per fax request.

## 2022-09-23 NOTE — Progress Notes (Unsigned)
Cardiology Office Note   Date:  09/24/2022   ID:  Katrina Castro, DOB Mar 02, 1946, MRN [id number]  PCP:  Katrina Castro, Katrina B, MD  Cardiologist:   Katrina PatesPaula Hutton Pellicane, MD    F/U of CAD    History of Present Illness: Katrina Castro is a 77 y.o. female with a history of CAD (s/p CABG in Nov 2021 (LIMA to LAD; SVG to PDA, PLSA; L radial to RI, OM1), HTN, HL and Type II DM   Post CABG had post op afib, UTI, diarrhea.   The pt has also had chronic chest pains      I saw the pt in cliic in Oct 2023   Since seen she denies CP   Breathing is OK except for allergies    Some cramping in legs/ feet at night   L leg from back to foot  Pinched nerve The pt just had her esophagus dilated    Botox injection   Swallowing is better   Current Meds  Medication Sig   acetaminophen (TYLENOL) 650 MG CR tablet Take 650 mg by mouth every 8 (eight) hours as needed for pain.   acyclovir (ZOVIRAX) 400 MG tablet Take 400 mg by mouth daily.   ALPRAZolam (XANAX) 0.5 MG tablet Take 0.25 mg by mouth at bedtime as needed for sleep.   aspirin 81 MG EC tablet Take 1 tablet (81 mg total) by mouth daily. Swallow whole.   Biotin w/ Vitamins C & E (HAIR SKIN & NAILS GUMMIES PO) Take 2 capsules by mouth daily.   Cholecalciferol (VITAMIN D) 50 MCG (2000 UT) tablet Take 2,000 Units by mouth daily.   clotrimazole-betamethasone (LOTRISONE) cream Apply 1 application topically 2 (two) times daily.   Cyanocobalamin (Castro-12 COMPLIANCE INJECTION IJ) Inject 1 Dose as directed every 30 (thirty) days.   Cyanocobalamin (Castro-12 PO) Take 1 tablet by mouth daily. Gummies   diclofenac Sodium (VOLTAREN) 1 % GEL Apply 1 application topically 4 (four) times daily as needed (pain).   dicyclomine (BENTYL) 10 MG capsule Take 1 capsule (10 mg total) by mouth every 12 (twelve) hours as needed for spasms.   empagliflozin (JARDIANCE) 25 MG TABS tablet Take 1 tablet (25 mg total) by mouth daily before breakfast.   ezetimibe (ZETIA) 10 MG tablet TAKE 1 TABLET  ONCE DAILY.   ferrous sulfate 325 (65 FE) MG EC tablet Take 325 mg by mouth daily with breakfast.   glipiZIDE (GLUCOTROL XL) 2.5 MG 24 hr tablet Take 1 tablet (2.5 mg total) by mouth daily with breakfast.   LANTUS SOLOSTAR 100 UNIT/ML Solostar Pen Inject 5 Units into the skin daily as needed (high blood glucose).   levocetirizine (XYZAL) 5 MG tablet Take 5 mg by mouth daily.   Menthol-Methyl Salicylate (MUSCLE RUB EX) Apply 1 application topically daily as needed (muscle pain). Armeniahina gel   metFORMIN (GLUCOPHAGE) 1000 MG tablet Take 1,000 mg by mouth 2 (two) times daily.   metoprolol tartrate (LOPRESSOR) 25 MG tablet TAKE (1/2) TABLET BY MOUTH 2 TIMES A DAY.   Multiple Vitamins-Minerals (ADULT GUMMY PO) Take 2 capsules by mouth daily. Gummies   nitroGLYCERIN (NITROSTAT) 0.4 MG SL tablet Place 1 tablet (0.4 mg total) under the tongue every 5 (five) minutes as needed for chest pain.   oxybutynin (DITROPAN-XL) 10 MG 24 hr tablet Take 10 mg by mouth daily.   pantoprazole (PROTONIX) 40 MG tablet Take 40 mg by mouth daily.   rosuvastatin (CRESTOR) 40 MG tablet TAKE 1 TABLET BY  MOUTH ONCE DAILY.     Allergies:   Bee venom, Levofloxacin, Cardizem [diltiazem], Sulfa antibiotics, Diazepam, and Lipitor [atorvastatin]   Past Medical History:  Diagnosis Date   Arthritis    CAD (coronary artery disease)    a. s/p CABG on 04/27/2020 with LIMA-LAD, seq SVG-PDA-PL, seq left radial-RI-OM   Concussion    2015   Depression    Diabetes    type 2   Dysphagia    Dysrhythmia    afib   GERD (gastroesophageal reflux disease)    Headache    History of bronchitis    Hypertension    Seasonal allergies    Toenail fungus     Past Surgical History:  Procedure Laterality Date   APPENDECTOMY     BACK SURGERY     x2   BOTOX INJECTION N/A 06/06/2016   Procedure: BOTOX INJECTION;  Surgeon: Katrina Hippo, MD;  [facility];  Service: Endoscopy;  Laterality: N/A;   BOTOX INJECTION N/A 01/17/2021    Procedure: BOTOX INJECTION;  Surgeon: Katrina Frame, MD;  [facility];  Service: Gastroenterology;  Laterality: N/A;   BOTOX INJECTION N/A 07/11/2021   Procedure: BOTOX INJECTION;  Surgeon: Katrina Frame, MD;  [facility];  Service: Gastroenterology;  Laterality: N/A;   BOTOX INJECTION N/A 08/21/2022   Procedure: BOTOX INJECTION;  Surgeon: Katrina Frame, MD;  [facility];  Service: Gastroenterology;  Laterality: N/A;   CHOLECYSTECTOMY     COLONOSCOPY N/A 11/05/2012   Procedure: COLONOSCOPY;  Surgeon: Katrina Hippo, MD;  [facility];  Service: Endoscopy;  Laterality: N/A;  1030   COLONOSCOPY WITH PROPOFOL N/A 05/01/2019   Procedure: COLONOSCOPY WITH PROPOFOL;  Surgeon: Katrina Hippo, MD;  [facility];  Service: Endoscopy;  Laterality: N/A;  11:20am   CORONARY ARTERY BYPASS GRAFT N/A 04/27/2020   Procedure: CORONARY ARTERY BYPASS GRAFTING (CABG) TIMES FIVE USING LEFT INTERNAL MAMMARY ARTERY, LEFT HARVESTED RADIAL ARTERY, RIGHT GREATER SAPHENOUS VEIN HARVESTED ENDOSCOPICALLY.;  Surgeon: Katrina Dolin, MD;  Location: [facility];  Service: Open Heart Surgery;  Laterality: N/A;   ESOPHAGEAL DILATION N/A 07/06/2015   Procedure: ESOPHAGEAL DILATION;  Surgeon: Katrina Hippo, MD;  [facility];  Service: Endoscopy;  Laterality: N/A;   ESOPHAGEAL DILATION N/A 06/06/2016   Procedure: ESOPHAGEAL DILATION;  Surgeon: Katrina Hippo, MD;  [facility];  Service: Endoscopy;  Laterality: N/A;   ESOPHAGEAL DILATION N/A 08/21/2022   Procedure: ESOPHAGEAL DILATION;  Surgeon: Katrina Frame, MD;  [facility];  Service: Gastroenterology;  Laterality: N/A;   ESOPHAGOGASTRODUODENOSCOPY N/A 02/12/2014   Procedure: ESOPHAGOGASTRODUODENOSCOPY (EGD);  Surgeon: Katrina Hippo, MD;  [facility];  Service: Endoscopy;  Laterality: N/A;  150   ESOPHAGOGASTRODUODENOSCOPY N/A  07/06/2015   Procedure: ESOPHAGOGASTRODUODENOSCOPY (EGD);  Surgeon: Katrina Hippo, MD;  [facility];  Service: Endoscopy;  Laterality: N/A;  1:25 - moved to 1/18 @ 10:30 - Ann to notify pt   ESOPHAGOGASTRODUODENOSCOPY (EGD) WITH ESOPHAGEAL DILATION N/A 07/25/2012   Procedure: ESOPHAGOGASTRODUODENOSCOPY (EGD) WITH ESOPHAGEAL DILATION;  Surgeon: Katrina Hippo, MD;  [facility];  Service: Endoscopy;  Laterality: N/A;  325-rescheduled to 855 Ann notified pt   ESOPHAGOGASTRODUODENOSCOPY (EGD) WITH PROPOFOL N/A 06/06/2016   Procedure: ESOPHAGOGASTRODUODENOSCOPY (EGD) WITH PROPOFOL;  Surgeon: Katrina Hippo, MD;  [facility];  Service: Endoscopy;  Laterality: N/A;   ESOPHAGOGASTRODUODENOSCOPY (EGD) WITH  PROPOFOL N/A 01/17/2021   Procedure: ESOPHAGOGASTRODUODENOSCOPY (EGD) WITH PROPOFOL;  Surgeon: Katrina Frame, MD;  [facility];  Service: Gastroenterology;  Laterality: N/A;  10:35   ESOPHAGOGASTRODUODENOSCOPY (EGD) WITH PROPOFOL N/A 07/11/2021   Procedure: ESOPHAGOGASTRODUODENOSCOPY (EGD) WITH PROPOFOL;  Surgeon: Katrina Frame, MD;  [facility];  Service: Gastroenterology;  Laterality: N/A;  11:15   ESOPHAGOGASTRODUODENOSCOPY (EGD) WITH PROPOFOL N/A 08/21/2022   Procedure: ESOPHAGOGASTRODUODENOSCOPY (EGD) WITH PROPOFOL;  Surgeon: Katrina Frame, MD;  [facility];  Service: Gastroenterology;  Laterality: N/A;  1115AM, ASA 2   EYE SURGERY     cataracts removed   Foot surgeries Bilateral    hammer toes   LEFT HEART CATH AND CORONARY ANGIOGRAPHY N/A 04/26/2020   Procedure: LEFT HEART CATH AND CORONARY ANGIOGRAPHY;  Surgeon: Swaziland, Peter M, MD;  Location: [facility];  Service: Cardiovascular;  Laterality: N/A;   MALONEY DILATION N/A 02/12/2014   Procedure: Elease Hashimoto DILATION;  Surgeon: Katrina Hippo, MD;  [facility];  Service: Endoscopy;  Laterality: N/A;   POLYPECTOMY  05/01/2019    Procedure: POLYPECTOMY;  Surgeon: Katrina Hippo, MD;  [facility];  Service: Endoscopy;;  colon    RADIAL ARTERY HARVEST Left 04/27/2020   Procedure: LEFT RADIAL ARTERY HARVEST;  Surgeon: Katrina Dolin, MD;  Location: [facility];  Service: Open Heart Surgery;  Laterality: Left;   Right knee arthroscopy     x2   TEE WITHOUT CARDIOVERSION N/A 04/27/2020   Procedure: TRANSESOPHAGEAL ECHOCARDIOGRAM (TEE);  Surgeon: Katrina Dolin, MD;  Location: [facility] OR;  Service: Open Heart Surgery;  Laterality: N/A;   TONSILLECTOMY     TOTAL ABDOMINAL HYSTERECTOMY     TOTAL KNEE ARTHROPLASTY Right 04/03/2017   Procedure: RIGHT TOTAL KNEE ARTHROPLASTY;  Surgeon: Ranee Gosselin, MD;  Location: [facility];  Service: Orthopedics;  Laterality: Right;     Social History:  The patient  reports that she has never smoked. She has never used smokeless tobacco. She reports that she does not drink alcohol and does not use drugs.   Family History:  The patient's family history includes Diabetes in her mother and sister; Stroke in her father.    ROS:  Please see the history of present illness. All other systems are reviewed and  Negative to the above problem except as noted.    PHYSICAL EXAM: VS:  BP (!) 142/70   Pulse 64   Ht 5' 2.5" (1.588 m)   Wt 133 lb 3.2 oz (60.4 kg)   SpO2 96%   BMI 23.97 kg/m   Pt upset todAY   Usually better   GEN: Patient is in NAD  HEENT: normal  Neck: no JVD,  no bruits Cardiac: RRR S1, S2.  No signficant murmur  No LE edema   Tr PT pulses  Respiratory:  clear to auscultation  GI: soft, nontender, nondistended,No hepatomegaly   EKG:  EKG is ordered today.   NSR 64 bpm     Echo  02/17/21   1. Left ventricular ejection fraction, by estimation, is 60 to 65%. The  left ventricle has normal function. The left ventricle has no regional  wall motion abnormalities. There is mild left ventricular hypertrophy.  Left ventricular diastolic parameters  are indeterminate.   2.  Right ventricular systolic function is mildly ro moderately reduced.  The right ventricular size is mildly enlarged. Tricuspid regurgitation  signal is inadequate for assessing PA pressure.   3. The  mitral valve is grossly normal. Trivial mitral valve  regurgitation.   4. The aortic valve was not well visualized. Aortic valve regurgitation  is not visualized.   5. The inferior vena cava is normal in size with greater than 50%  respiratory variability, suggesting right atrial pressure of 3 mmHg.    Lipid Panel    Component Value Date/Time   CHOL 112 03/23/2022 1343   CHOL 217 (H) 03/31/2020 1208   TRIG 138 03/23/2022 1343   HDL 48 03/23/2022 1343   HDL 38 (L) 03/31/2020 1208   CHOLHDL 2.3 03/23/2022 1343   VLDL 28 03/23/2022 1343   LDLCALC 36 03/23/2022 1343   LDLCALC 144 (H) 03/31/2020 1208      Wt Readings from Last 3 Encounters:  09/24/22 133 lb 3.2 oz (60.4 kg)  08/14/22 139 lb 6.4 oz (63.2 kg)  08/02/22 138 lb 9.6 oz (62.9 kg)      ASSESSMENT AND PLAN:  1  CAD   CABG in 2021   No CP   Continue to follow    2   HTN  BP is a little high   140s/   Check at home   Call   if 140s or above   3  HL  Recheck lipids   4  DM   A1C   7.6  Cut out SSB  5  L leg cramping     Some appears muscular   will get LE dopplers given that PT down      Current medicines are reviewed at length with the patient today.  The patient does not have concerns regarding medicines.  Signed, Katrina Pates, MD  09/24/2022 1:14 PM    [facility] [address] [place], [place], [place]  [address] Phone: [phone]; Fax: [phone]

## 2022-09-24 ENCOUNTER — Ambulatory Visit: Payer: Medicare Other | Attending: Internal Medicine | Admitting: Internal Medicine

## 2022-09-24 ENCOUNTER — Other Ambulatory Visit (HOSPITAL_COMMUNITY)
Admission: RE | Admit: 2022-09-24 | Discharge: 2022-09-24 | Disposition: A | Discharge status: Home / Self Care | Payer: Medicare Other | Source: Ambulatory Visit | Attending: Internal Medicine | Admitting: Internal Medicine

## 2022-09-24 ENCOUNTER — Encounter: Payer: Self-pay | Admitting: Internal Medicine

## 2022-09-24 VITALS — BP 142/70 | HR 64 | Ht 62.5 in | Wt 133.2 lb

## 2022-09-24 DIAGNOSIS — R600 Localized edema: Secondary | ICD-10-CM | POA: Diagnosis not present

## 2022-09-24 DIAGNOSIS — I10 Essential (primary) hypertension: Secondary | ICD-10-CM

## 2022-09-24 DIAGNOSIS — E785 Hyperlipidemia, unspecified: Secondary | ICD-10-CM | POA: Insufficient documentation

## 2022-09-24 NOTE — Patient Instructions (Signed)
Medication Instructions:  Your physician recommends that you continue on your current medications as directed. Please refer to the Current Medication list given to you today.  *If you need a refill on your cardiac medications before your next appointment, please call your pharmacy*   Lab Work: Lipomed today If you have labs (blood work) drawn today and your tests are completely normal, you will receive your results only by: MyChart Message (if you have MyChart) OR A paper copy in the mail If you have any lab test that is abnormal or we need to change your treatment, we will call you to review the results.   Testing/Procedures: Lower Extremity Arterial Doppler- [place]   Follow-Up: At [facility], you and your health needs are our priority.  As part of our continuing mission to provide you with exceptional heart care, we have created designated Provider Care Teams.  These Care Teams include your primary Cardiologist (physician) and Advanced Practice Providers (APPs -  Physician Assistants and Nurse Practitioners) who all work together to provide you with the care you need, when you need it.  We recommend signing up for the patient portal called "MyChart".  Sign up information is provided on this After Visit Summary.  MyChart is used to connect with patients for Virtual Visits (Telemedicine).  Patients are able to view lab/test results, encounter notes, upcoming appointments, etc.  Non-urgent messages can be sent to your provider as well.   To learn more about what you can do with MyChart, go to [email or web address].    Your next appointment:   7 month(s)  Provider:   Dietrich Pates, MD    Other Instructions

## 2022-09-25 LAB — NMR, LIPOPROFILE
Cholesterol, Total: 125 mg/dL (ref 100–199)
HDL Cholesterol by NMR: 63 mg/dL (ref >39)
HDL Particle Number: 44.9 umol/L (ref >=30.5)
LDL Particle Number: 538 nmol/L (ref <1000)
LDL Size: 19.9 nm — ABNORMAL LOW (ref >20.5)
LDL-C (NIH Calc): 44 mg/dL (ref 0–99)
LP-IR Score: 49 — ABNORMAL HIGH (ref <=45)
Small LDL Particle Number: 381 nmol/L (ref <=527)
Triglycerides by NMR: 100 mg/dL (ref 0–149)

## 2022-09-26 ENCOUNTER — Ambulatory Visit: Payer: Medicare Other | Admitting: Podiatry

## 2022-09-26 ENCOUNTER — Telehealth: Payer: Self-pay

## 2022-09-26 DIAGNOSIS — M6281 Muscle weakness (generalized): Secondary | ICD-10-CM | POA: Diagnosis not present

## 2022-09-26 DIAGNOSIS — M25561 Pain in right knee: Secondary | ICD-10-CM | POA: Diagnosis not present

## 2022-09-26 DIAGNOSIS — M545 Low back pain, unspecified: Secondary | ICD-10-CM | POA: Diagnosis not present

## 2022-09-26 NOTE — Telephone Encounter (Signed)
-----   Message from Bertram Millard, RN sent at 09/26/2022  7:59 AM EDT -----  ----- Message ----- From: Pricilla Riffle, MD Sent: 09/25/2022  10:24 PM EDT To: Bertram Millard, RN  Lipids are excellent    Keep on same meds

## 2022-09-26 NOTE — Telephone Encounter (Signed)
Patient notified and verbalized understanding. Patient had no questions or concerns at this time. PCP copied 

## 2022-09-26 NOTE — Telephone Encounter (Signed)
Inbound fax from DME supplier requesting form be completed and faxed with clinical notes. DME supplies ordered via [employer] through online portal. [facility]: Josephine Igo 3 reader and sensors.

## 2022-09-28 DIAGNOSIS — M25561 Pain in right knee: Secondary | ICD-10-CM | POA: Diagnosis not present

## 2022-09-28 DIAGNOSIS — M545 Low back pain, unspecified: Secondary | ICD-10-CM | POA: Diagnosis not present

## 2022-09-28 DIAGNOSIS — M6281 Muscle weakness (generalized): Secondary | ICD-10-CM | POA: Diagnosis not present

## 2022-10-01 NOTE — Telephone Encounter (Addendum)
Pt lvm advising follow up needed with [facility] regarding [employer] order. Message sent to [facility] via [employer] for an update.

## 2022-10-02 ENCOUNTER — Other Ambulatory Visit: Payer: Self-pay | Admitting: Internal Medicine

## 2022-10-02 DIAGNOSIS — E785 Hyperlipidemia, unspecified: Secondary | ICD-10-CM

## 2022-10-02 DIAGNOSIS — I739 Peripheral vascular disease, unspecified: Secondary | ICD-10-CM

## 2022-10-02 DIAGNOSIS — R600 Localized edema: Secondary | ICD-10-CM

## 2022-10-02 DIAGNOSIS — E1159 Type 2 diabetes mellitus with other circulatory complications: Secondary | ICD-10-CM | POA: Diagnosis not present

## 2022-10-02 DIAGNOSIS — I10 Essential (primary) hypertension: Secondary | ICD-10-CM

## 2022-10-03 DIAGNOSIS — E1159 Type 2 diabetes mellitus with other circulatory complications: Secondary | ICD-10-CM | POA: Diagnosis not present

## 2022-10-04 ENCOUNTER — Other Ambulatory Visit: Payer: Self-pay | Admitting: Internal Medicine

## 2022-10-04 DIAGNOSIS — I25119 Atherosclerotic heart disease of native coronary artery with unspecified angina pectoris: Secondary | ICD-10-CM | POA: Diagnosis not present

## 2022-10-04 DIAGNOSIS — I739 Peripheral vascular disease, unspecified: Secondary | ICD-10-CM | POA: Diagnosis not present

## 2022-10-04 DIAGNOSIS — I4891 Unspecified atrial fibrillation: Secondary | ICD-10-CM | POA: Diagnosis not present

## 2022-10-04 DIAGNOSIS — Z299 Encounter for prophylactic measures, unspecified: Secondary | ICD-10-CM | POA: Diagnosis not present

## 2022-10-04 DIAGNOSIS — E1165 Type 2 diabetes mellitus with hyperglycemia: Secondary | ICD-10-CM

## 2022-10-04 DIAGNOSIS — M545 Low back pain, unspecified: Secondary | ICD-10-CM | POA: Diagnosis not present

## 2022-10-04 DIAGNOSIS — M6281 Muscle weakness (generalized): Secondary | ICD-10-CM | POA: Diagnosis not present

## 2022-10-04 DIAGNOSIS — M25561 Pain in right knee: Secondary | ICD-10-CM | POA: Diagnosis not present

## 2022-10-04 DIAGNOSIS — I10 Essential (primary) hypertension: Secondary | ICD-10-CM | POA: Diagnosis not present

## 2022-10-09 DIAGNOSIS — M25561 Pain in right knee: Secondary | ICD-10-CM | POA: Diagnosis not present

## 2022-10-09 DIAGNOSIS — M545 Low back pain, unspecified: Secondary | ICD-10-CM | POA: Diagnosis not present

## 2022-10-09 DIAGNOSIS — M6281 Muscle weakness (generalized): Secondary | ICD-10-CM | POA: Diagnosis not present

## 2022-10-11 DIAGNOSIS — M25561 Pain in right knee: Secondary | ICD-10-CM | POA: Diagnosis not present

## 2022-10-11 DIAGNOSIS — M6281 Muscle weakness (generalized): Secondary | ICD-10-CM | POA: Diagnosis not present

## 2022-10-11 DIAGNOSIS — M545 Low back pain, unspecified: Secondary | ICD-10-CM | POA: Diagnosis not present

## 2022-10-18 ENCOUNTER — Ambulatory Visit: Payer: Medicare Other | Attending: Internal Medicine

## 2022-10-18 DIAGNOSIS — I739 Peripheral vascular disease, unspecified: Secondary | ICD-10-CM

## 2022-10-18 LAB — VAS US ABI WITH/WO TBI
Left ABI: 1.09
Right ABI: 1.09

## 2022-10-19 DIAGNOSIS — M545 Low back pain, unspecified: Secondary | ICD-10-CM | POA: Diagnosis not present

## 2022-10-19 DIAGNOSIS — M6281 Muscle weakness (generalized): Secondary | ICD-10-CM | POA: Diagnosis not present

## 2022-10-19 DIAGNOSIS — M25561 Pain in right knee: Secondary | ICD-10-CM | POA: Diagnosis not present

## 2022-10-22 DIAGNOSIS — Z299 Encounter for prophylactic measures, unspecified: Secondary | ICD-10-CM | POA: Diagnosis not present

## 2022-10-22 DIAGNOSIS — R52 Pain, unspecified: Secondary | ICD-10-CM | POA: Diagnosis not present

## 2022-10-22 DIAGNOSIS — M5431 Sciatica, right side: Secondary | ICD-10-CM | POA: Diagnosis not present

## 2022-10-22 DIAGNOSIS — I10 Essential (primary) hypertension: Secondary | ICD-10-CM | POA: Diagnosis not present

## 2022-10-23 DIAGNOSIS — M25561 Pain in right knee: Secondary | ICD-10-CM | POA: Diagnosis not present

## 2022-10-23 DIAGNOSIS — M6281 Muscle weakness (generalized): Secondary | ICD-10-CM | POA: Diagnosis not present

## 2022-10-23 DIAGNOSIS — M545 Low back pain, unspecified: Secondary | ICD-10-CM | POA: Diagnosis not present

## 2022-10-25 ENCOUNTER — Other Ambulatory Visit (HOSPITAL_COMMUNITY): Payer: Self-pay | Admitting: Neurosurgery

## 2022-10-25 DIAGNOSIS — M5416 Radiculopathy, lumbar region: Secondary | ICD-10-CM | POA: Diagnosis not present

## 2022-10-26 DIAGNOSIS — E538 Deficiency of other specified B group vitamins: Secondary | ICD-10-CM | POA: Diagnosis not present

## 2022-10-30 DIAGNOSIS — M545 Low back pain, unspecified: Secondary | ICD-10-CM | POA: Diagnosis not present

## 2022-10-30 DIAGNOSIS — M6281 Muscle weakness (generalized): Secondary | ICD-10-CM | POA: Diagnosis not present

## 2022-10-30 DIAGNOSIS — M25561 Pain in right knee: Secondary | ICD-10-CM | POA: Diagnosis not present

## 2022-11-02 ENCOUNTER — Ambulatory Visit (HOSPITAL_COMMUNITY)
Admission: RE | Admit: 2022-11-02 | Discharge: 2022-11-02 | Disposition: A | Discharge status: Home / Self Care | Payer: Medicare Other | Source: Ambulatory Visit | Attending: Neurosurgery | Admitting: Neurosurgery

## 2022-11-02 DIAGNOSIS — M48061 Spinal stenosis, lumbar region without neurogenic claudication: Secondary | ICD-10-CM | POA: Diagnosis not present

## 2022-11-02 DIAGNOSIS — M5416 Radiculopathy, lumbar region: Secondary | ICD-10-CM | POA: Diagnosis not present

## 2022-11-02 DIAGNOSIS — M47816 Spondylosis without myelopathy or radiculopathy, lumbar region: Secondary | ICD-10-CM | POA: Diagnosis not present

## 2022-11-02 DIAGNOSIS — M5126 Other intervertebral disc displacement, lumbar region: Secondary | ICD-10-CM | POA: Diagnosis not present

## 2022-11-06 DIAGNOSIS — M6281 Muscle weakness (generalized): Secondary | ICD-10-CM | POA: Diagnosis not present

## 2022-11-06 DIAGNOSIS — M25561 Pain in right knee: Secondary | ICD-10-CM | POA: Diagnosis not present

## 2022-11-06 DIAGNOSIS — M545 Low back pain, unspecified: Secondary | ICD-10-CM | POA: Diagnosis not present

## 2022-11-14 DIAGNOSIS — M7138 Other bursal cyst, other site: Secondary | ICD-10-CM | POA: Diagnosis not present

## 2022-11-14 DIAGNOSIS — M5416 Radiculopathy, lumbar region: Secondary | ICD-10-CM | POA: Diagnosis not present

## 2022-11-15 ENCOUNTER — Encounter (INDEPENDENT_AMBULATORY_CARE_PROVIDER_SITE_OTHER): Payer: Self-pay | Admitting: Gastroenterology

## 2022-11-15 ENCOUNTER — Ambulatory Visit (INDEPENDENT_AMBULATORY_CARE_PROVIDER_SITE_OTHER): Payer: Medicare Other | Admitting: Gastroenterology

## 2022-11-15 ENCOUNTER — Telehealth: Payer: Self-pay | Admitting: *Deleted

## 2022-11-15 VITALS — BP 132/76 | HR 83 | Temp 97.8°F | Ht 63.0 in | Wt 132.2 lb

## 2022-11-15 DIAGNOSIS — K5900 Constipation, unspecified: Secondary | ICD-10-CM | POA: Diagnosis not present

## 2022-11-15 DIAGNOSIS — R140 Abdominal distension (gaseous): Secondary | ICD-10-CM | POA: Diagnosis not present

## 2022-11-15 DIAGNOSIS — R1310 Dysphagia, unspecified: Secondary | ICD-10-CM | POA: Diagnosis not present

## 2022-11-15 MED ORDER — OMEPRAZOLE 40 MG PO CPDR: 40.0000 mg | DELAYED_RELEASE_CAPSULE | Freq: Every day | ORAL | 3 refills | Status: DC

## 2022-11-15 MED ORDER — DICYCLOMINE HCL 10 MG PO CAPS
10.0000 mg | ORAL_CAPSULE | Freq: Two times a day (BID) | ORAL | 1 refills | Status: DC | PRN
Start: 2022-11-15 — End: 2023-03-28

## 2022-11-15 NOTE — Progress Notes (Addendum)
Referring Provider: Ignatius Specking, MD Primary Care Physician:  Ignatius Specking, MD Primary GI Physician: Levon Hedger   Chief Complaint  Patient presents with   Follow-up    Patient here today for a follow up on dysphagia.   HPI:   Katrina Castro is a 77 y.o. female with past medical history of  c. Difficile, coronary artery disease status post CABG, hypertension, GERD, type III achalasia, type 2 diabetes and depression   Patient presenting today for follow up of dysphagia.   Last seen February 2024,at that time, having ongoing dysphagia, she ate some chicken on Saturday night that she reportedly cut up very small, after about 2 bites, she felt food was stuck in mid chest, it would not go down or come up. tried to drink water but it was also coming right back up. She eventually had to perform the heimleich maneuver on herself as she lives alone, this was eventually successful and she was able to get the food up. no longer taking levsin she was previously prescribed at [facility], does not recall taking this in the past.  She does note she coughed up a small amount of blood after she performed the heimleich maneuver but no further episodes of this.  on pantoprazole for GERD. She tries to avoid trigger foods though still having some issues with heartburn. She takes tums and mylanta though cannot tell me how often she is needing this or having symptoms.   Recommended EGD +/- dilation with possible botox injection, stop protonix, start omeprazole EGD as below with botox injection, will need to refer back to [facility] if dysphagia persist/worsens   Present:  Reports dysphagia has been much better. She is taking dicyclomine once a day before eating a larger meals that seems to help with her previous bloating/abdominal cramping. She needs a refill on this. She is doing omeprazole and feels this is working well to control her GERD symptoms. She tries to avoid breads as this can cause her to have dysphagia at  times. Denies nausea, vomiting, early satiety, weight loss, rectal bleeding or melena.   She has some occasional constipation, she takes stool softeners PRN which does not provide much results. She notes water intake is not great. She does try to do plenty of fruits and veggies in her diet.   Reports she is probably going to have to have upcoming back surgery for nerve impingement which she is hesitant about.    esophageal manometry-[facility] on February 2017 which showed findings consistent with type III achalasia as her IRP was 22 with presence of a spastic esophagus. She had previously responded to the use of Botox  Last Colonoscopy:2020 - The examined portion of the ileum was normal.                           - One small polyp in the proximal sigmoid colon.                            Biopsied-TA                            - External hemorrhoids. Last Endoscopy:08/2022- Normal esophagus. Dilated. Injected with                            botulinum toxin.                           -  Normal stomach.                           - Normal examined duodenum.                           - No specimens collected.  Recommendations:  TCS due 2027, health permitting    Past Medical History:  Diagnosis Date   Arthritis    CAD (coronary artery disease)    a. s/p CABG on 04/27/2020 with LIMA-LAD, seq SVG-PDA-PL, seq left radial-RI-OM   Concussion    2015   Depression    Diabetes (HCC)    type 2   Dysphagia    Dysrhythmia    afib   GERD (gastroesophageal reflux disease)    Headache    History of bronchitis    Hypertension    Seasonal allergies    Toenail fungus     Past Surgical History:  Procedure Laterality Date   APPENDECTOMY     BACK SURGERY     x2   BOTOX INJECTION N/A 06/06/2016   Procedure: BOTOX INJECTION;  Surgeon: Malissa Hippo, MD;  [facility];  Service: Endoscopy;  Laterality: N/A;   BOTOX INJECTION N/A 01/17/2021   Procedure: BOTOX INJECTION;  Surgeon:  Dolores Frame, MD;  [facility];  Service: Gastroenterology;  Laterality: N/A;   BOTOX INJECTION N/A 07/11/2021   Procedure: BOTOX INJECTION;  Surgeon: Dolores Frame, MD;  [facility];  Service: Gastroenterology;  Laterality: N/A;   BOTOX INJECTION N/A 08/21/2022   Procedure: BOTOX INJECTION;  Surgeon: Dolores Frame, MD;  [facility];  Service: Gastroenterology;  Laterality: N/A;   CHOLECYSTECTOMY     COLONOSCOPY N/A 11/05/2012   Procedure: COLONOSCOPY;  Surgeon: Malissa Hippo, MD;  [facility];  Service: Endoscopy;  Laterality: N/A;  1030   COLONOSCOPY WITH PROPOFOL N/A 05/01/2019   Procedure: COLONOSCOPY WITH PROPOFOL;  Surgeon: Malissa Hippo, MD;  [facility];  Service: Endoscopy;  Laterality: N/A;  11:20am   CORONARY ARTERY BYPASS GRAFT N/A 04/27/2020   Procedure: CORONARY ARTERY BYPASS GRAFTING (CABG) TIMES FIVE USING LEFT INTERNAL MAMMARY ARTERY, LEFT HARVESTED RADIAL ARTERY, RIGHT GREATER SAPHENOUS VEIN HARVESTED ENDOSCOPICALLY.;  Surgeon: Linden Dolin, MD;  [facility]: [facility];  Service: Open Heart Surgery;  Laterality: N/A;   ESOPHAGEAL DILATION N/A 07/06/2015   Procedure: ESOPHAGEAL DILATION;  Surgeon: Malissa Hippo, MD;  [facility];  Service: Endoscopy;  Laterality: N/A;   ESOPHAGEAL DILATION N/A 06/06/2016   Procedure: ESOPHAGEAL DILATION;  Surgeon: Malissa Hippo, MD;  [facility];  Service: Endoscopy;  Laterality: N/A;   ESOPHAGEAL DILATION N/A 08/21/2022   Procedure: ESOPHAGEAL DILATION;  Surgeon: Dolores Frame, MD;  [facility];  Service: Gastroenterology;  Laterality: N/A;   ESOPHAGOGASTRODUODENOSCOPY N/A 02/12/2014   Procedure: ESOPHAGOGASTRODUODENOSCOPY (EGD);  Surgeon: Malissa Hippo, MD;  [facility];  Service: Endoscopy;  Laterality: N/A;  150   ESOPHAGOGASTRODUODENOSCOPY N/A 07/06/2015   Procedure:  ESOPHAGOGASTRODUODENOSCOPY (EGD);  Surgeon: Malissa Hippo, MD;  [facility];  Service: Endoscopy;  Laterality: N/A;  1:25 - moved to 1/18 @ 10:30 - Ann to notify pt   ESOPHAGOGASTRODUODENOSCOPY (EGD) WITH ESOPHAGEAL DILATION N/A 07/25/2012   Procedure: ESOPHAGOGASTRODUODENOSCOPY (EGD) WITH ESOPHAGEAL DILATION;  Surgeon: Malissa Hippo, MD;  [facility];  Service: Endoscopy;  Laterality: N/A;  325-rescheduled to 855 Ann notified pt   ESOPHAGOGASTRODUODENOSCOPY (EGD) WITH PROPOFOL N/A 06/06/2016   Procedure: ESOPHAGOGASTRODUODENOSCOPY (EGD) WITH PROPOFOL;  Surgeon: Malissa Hippo, MD;  [facility];  Service: Endoscopy;  Laterality: N/A;   ESOPHAGOGASTRODUODENOSCOPY (EGD) WITH PROPOFOL N/A 01/17/2021   Procedure: ESOPHAGOGASTRODUODENOSCOPY (EGD) WITH PROPOFOL;  Surgeon: Dolores Frame, MD;  [facility];  Service: Gastroenterology;  Laterality: N/A;  10:35   ESOPHAGOGASTRODUODENOSCOPY (EGD) WITH PROPOFOL N/A 07/11/2021   Procedure: ESOPHAGOGASTRODUODENOSCOPY (EGD) WITH PROPOFOL;  Surgeon: Dolores Frame, MD;  [facility];  Service: Gastroenterology;  Laterality: N/A;  11:15   ESOPHAGOGASTRODUODENOSCOPY (EGD) WITH PROPOFOL N/A 08/21/2022   Procedure: ESOPHAGOGASTRODUODENOSCOPY (EGD) WITH PROPOFOL;  Surgeon: Dolores Frame, MD;  [facility];  Service: Gastroenterology;  Laterality: N/A;  1115AM, ASA 2   EYE SURGERY     cataracts removed   Foot surgeries Bilateral    hammer toes   LEFT HEART CATH AND CORONARY ANGIOGRAPHY N/A 04/26/2020   Procedure: LEFT HEART CATH AND CORONARY ANGIOGRAPHY;  Surgeon: Swaziland, Peter M, MD;  [facility]: [facility] INVASIVE CV LAB;  Service: Cardiovascular;  Laterality: N/A;   MALONEY DILATION N/A 02/12/2014   Procedure: Elease Hashimoto DILATION;  Surgeon: Malissa Hippo, MD;  [facility];  Service: Endoscopy;  Laterality: N/A;   POLYPECTOMY  05/01/2019   Procedure: POLYPECTOMY;   Surgeon: Malissa Hippo, MD;  [facility];  Service: Endoscopy;;  colon    RADIAL ARTERY HARVEST Left 04/27/2020   Procedure: LEFT RADIAL ARTERY HARVEST;  Surgeon: Linden Dolin, MD;  [facility]: [facility];  Service: Open Heart Surgery;  Laterality: Left;   Right knee arthroscopy     x2   TEE WITHOUT CARDIOVERSION N/A 04/27/2020   Procedure: TRANSESOPHAGEAL ECHOCARDIOGRAM (TEE);  Surgeon: Linden Dolin, MD;  Location: [facility] OR;  Service: Open Heart Surgery;  Laterality: N/A;   TONSILLECTOMY     TOTAL ABDOMINAL HYSTERECTOMY     TOTAL KNEE ARTHROPLASTY Right 04/03/2017   Procedure: RIGHT TOTAL KNEE ARTHROPLASTY;  Surgeon: Ranee Gosselin, MD;  Location: [facility];  Service: Orthopedics;  Laterality: Right;    Current Outpatient Medications  Medication Sig Dispense Refill   acetaminophen (TYLENOL) 650 MG CR tablet Take 650 mg by mouth every 8 (eight) hours as needed for pain.     ALPRAZolam (XANAX) 0.5 MG tablet Take 0.25 mg by mouth at bedtime as needed for sleep.     aspirin 81 MG EC tablet Take 1 tablet (81 mg total) by mouth daily. Swallow whole. 30 tablet 11   Biotin w/ Vitamins C & E (HAIR SKIN & NAILS GUMMIES PO) Take 2 capsules by mouth daily.     Cholecalciferol (VITAMIN D) 50 MCG (2000 UT) tablet Take 2,000 Units by mouth daily.     clotrimazole-betamethasone (LOTRISONE) cream Apply 1 application topically 2 (two) times daily. 30 g 0   Cyanocobalamin (B-12 COMPLIANCE INJECTION IJ) Inject 1 Dose as directed every 30 (thirty) days.     Cyanocobalamin (B-12 PO) Take 1 tablet by mouth daily. Gummies     empagliflozin (JARDIANCE) 25 MG TABS tablet TAKE 1 TABLET BY MOUTH DAILY BEFORE BREAKFAST. 90 tablet 3   ezetimibe (ZETIA) 10 MG tablet TAKE 1 TABLET ONCE DAILY. 90 tablet 3   ferrous sulfate 325 (65 FE) MG EC tablet Take 325 mg by mouth daily with breakfast. Takes occasionally.     glipiZIDE (GLUCOTROL XL)  2.5 MG 24 hr tablet Take 1 tablet (2.5 mg total) by mouth daily with  breakfast. 90 tablet 3   LANTUS SOLOSTAR 100 UNIT/ML Solostar Pen Inject 5 Units into the skin daily as needed (high blood glucose).     levocetirizine (XYZAL) 5 MG tablet Take 5 mg by mouth daily.     Menthol-Methyl Salicylate (MUSCLE RUB EX) Apply 1 application topically daily as needed (muscle pain). [place] gel     metFORMIN (GLUCOPHAGE) 1000 MG tablet Take 1,000 mg by mouth 2 (two) times daily.     metoprolol tartrate (LOPRESSOR) 25 MG tablet TAKE (1/2) TABLET BY MOUTH 2 TIMES A DAY. 90 tablet 3   Multiple Vitamins-Minerals (ADULT GUMMY PO) Take 2 capsules by mouth daily. Gummies     nitroGLYCERIN (NITROSTAT) 0.4 MG SL tablet Place 1 tablet (0.4 mg total) under the tongue every 5 (five) minutes as needed for chest pain. 25 tablet 3   oxybutynin (DITROPAN-XL) 10 MG 24 hr tablet Take 10 mg by mouth daily.     pantoprazole (PROTONIX) 40 MG tablet Take 40 mg by mouth daily.     acyclovir (ZOVIRAX) 400 MG tablet Take 400 mg by mouth daily.     dicyclomine (BENTYL) 10 MG capsule Take 1 capsule (10 mg total) by mouth every 12 (twelve) hours as needed for spasms. (Patient not taking: Reported on 11/15/2022) 120 capsule 0   No current facility-administered medications for this visit.    Allergies as of 11/15/2022 - Review Complete 11/15/2022  Allergen Reaction Noted   Bee venom Anaphylaxis 08/05/2015   Levofloxacin Anaphylaxis and Rash 04/03/2017   Cardizem [diltiazem] Other (See Comments) 08/17/2015   Sulfa antibiotics Itching 07/13/2014   Diazepam Nausea Only 04/03/2017   Lipitor [atorvastatin] Other (See Comments) 10/12/2019    Family History  Problem Relation Age of Onset   Diabetes Mother    Stroke Father    Diabetes Sister    Colon cancer Neg Hx    Breast cancer Neg Hx     Social History   Socioeconomic History   Marital status: Divorced    Spouse name: Not on file   Number of children: Not on file   Years of education: Not on file   Highest education level: Not on file   Occupational History   Not on file  Tobacco Use   Smoking status: Never   Smokeless tobacco: Never  Vaping Use   Vaping Use: Never used  Substance and Sexual Activity   Alcohol use: No    Alcohol/week: 0.0 standard drinks of alcohol    Comment: socially    Drug use: No   Sexual activity: Not Currently    Partners: Male    Birth control/protection: None  Other Topics Concern   Not on file  Social History Narrative   Not on file   Social Determinants of Health   Financial Resource Strain: Not on file  Food Insecurity: Not on file  Transportation Needs: Not on file  Physical Activity: Not on file  Stress: Not on file  Social Connections: Not on file   Review of systems General: negative for malaise, night sweats, fever, chills, weight loss Neck: Negative for lumps, goiter, pain and significant neck swelling Resp: Negative for cough, wheezing, dyspnea at rest CV: Negative for chest pain, leg swelling, palpitations, orthopnea GI: denies melena, hematochezia, nausea, vomiting, diarrhea, dysphagia, odyonophagia, early satiety or unintentional weight loss. +Constipation  MSK: Negative for joint pain or swelling, back pain, and muscle pain.  Derm: Negative for itching or rash Psych: Denies depression, anxiety, memory loss, confusion. No homicidal or suicidal ideation.  Heme: Negative for prolonged bleeding, bruising easily, and swollen nodes. Endocrine: Negative for cold or heat intolerance, polyuria, polydipsia and goiter. Neuro: negative for tremor, gait imbalance, syncope and seizures. The remainder of the review of systems is noncontributory.  Physical Exam: BP 132/76 (BP Location: Right Arm, Patient Position: Sitting, Cuff Size: Normal)   Pulse 83   Temp 97.8 F (36.6 C) (Temporal)   Ht 5\' 3"  (1.6 m)   Wt 132 lb 3.2 oz (60 kg)   BMI 23.42 kg/m  General:   Alert and oriented. No distress noted. Pleasant and cooperative.  Head:  Normocephalic and atraumatic. Eyes:   Conjuctiva clear without scleral icterus. Mouth:  Oral mucosa pink and moist. Good dentition. No lesions. Heart: Normal rate and rhythm, s1 and s2 heart sounds present.  Lungs: Clear lung sounds in all lobes. Respirations equal and unlabored. Abdomen:  +BS, soft, non-tender and non-distended. No rebound or guarding. No HSM or masses noted. Derm: No palmar erythema or jaundice Msk:  Symmetrical without gross deformities. Normal posture. Extremities:  Without edema. Neurologic:  Alert and  oriented x4 Psych:  Alert and cooperative. Normal mood and affect.  Invalid input(s): "6 MONTHS"   ASSESSMENT: Katrina Castro is a 77 y.o. female presenting today for follow up  of dysphagia and GERD.  GERD/Dysphagia: history of achalasia, recent EGD with botox injection. Notes significant improvement in dysphagia. Occasionally has issues with breads but tries to avoid these. No further episodes of food impaction. GERD well controlled on Omeprazole 40mg  daily. Will continue with current regimen. Should avoid thicker, drier foods. She will make me aware if she has recurrence of dysphagia.  Constipation: having harder stools at times, stool softener does not provide much relief. Notes water intake could be better. Encouraged to increase water intake, diet high in fruits, veggies, whole grains, especially kiwi and prunes. She has either metamucil or benefiber at home but has not used it, she can start with this for her constipation but should ensure water intake is good. If this does not improve symptoms would recommend Start taking Miralax 1 capful every day for one week. If bowel movements do not improve, increase to 1 capful every 12 hours. If after two weeks there is no improvement, increase to 1 capful every 8 hours.    PLAN:  Continue with omeprazole 40mg  daily  2. Continue with dicyclomine 10mg  Q12h PRN  3.  Increase water intake 4. Increase fruits, veggies and whole grains, kiwi and prunes are  especially good for constipation 5. Can try metamucil/benefiber, miralax if constipation not improving  6. Chewing precautions/avoiding thicker drier foods  All questions were answered, patient verbalized understanding and is in agreement with plan as outlined above.    Follow Up: 6 months  Katrina Castro L. Jeanmarie Hubert, MSN, APRN, AGNP-C Adult-Gerontology Nurse Practitioner [facility]  I have reviewed the note and agree with the APP's assessment as described in this progress note  [name], MD Gastroenterology and Hepatology [facility]

## 2022-11-15 NOTE — Patient Instructions (Addendum)
Continue omeprazole 40mg  daily and dicyclomine 10mg  up to twice a day before meals  I would keep avoiding breads and thicker foods that tend to cause you issues with swallowing  For constipation, Increase water intake, aim for atleast 64 oz per day Increase fruits, veggies and whole grains, kiwi and prunes are especially good for constipation  If you have metamucil or benefiber on hand, I would try adding this in first, but you need to make sure water intake is good. If this does not help, Start taking Miralax 1 capful every day for one week. If bowel movements do not improve, increase to 1 capful every 12 hours. If after two weeks there is no improvement, increase to 1 capful every 8 hours  Follow up 6 months  It was a pleasure to see you today. I want to create trusting relationships with patients and provide genuine, compassionate, and quality care. I truly value your feedback! please be on the lookout for a survey regarding your visit with me today. I appreciate your input about our visit and your time in completing this!    Tichina Koebel L. Jeanmarie Hubert, MSN, APRN, AGNP-C Adult-Gerontology Nurse Practitioner [facility] at [facility]

## 2022-11-15 NOTE — Telephone Encounter (Signed)
     Primary Cardiologist: Dietrich Pates, MD  Chart reviewed as part of pre-operative protocol coverage. Given past medical history and time since last visit, based on ACC/AHA guidelines, Katrina Castro would be at acceptable risk for the planned procedure without further cardiovascular testing.   Her RCRI is a class III risk, 6.6% risk of major cardiac event.  Her aspirin may be held for 5-7 days prior to her surgery.  Please resume as soon as hemostasis is achieved.  I will route this recommendation to the requesting party via Epic fax function and remove from pre-op pool.  Please call with questions.  Thomasene Ripple. Jackye Dever NP-C     11/15/2022, 10:53 AM [facility] [address] Office [phone] Fax [phone]

## 2022-11-15 NOTE — Telephone Encounter (Signed)
Returned call to pt.  She has been made aware that we just received the clearance and it is being processed.  Pt was just in a lot of pain and needed it done as quickly as possible, as they are waiting on clearance to schedule pt.

## 2022-11-15 NOTE — Telephone Encounter (Signed)
Patient called to follow-up on status of clearance. 

## 2022-11-15 NOTE — Telephone Encounter (Signed)
   Pre-operative Risk Assessment    Patient Name: Katrina Castro  DOB: 21-Aug-1945 MRN: [id number]      Request for Surgical Clearance    Procedure:   L5-S1 LUMAR LAMINECTOMY  Date of Surgery:  Clearance TBD                                 Surgeon:  Temple Pacini, MD Surgeon's Group or Practice Name:  [facility] Phone number:  [phone] Fax number:  [phone]   Type of Clearance Requested:   - Medical  - Pharmacy:  Hold Aspirin NOT INDICATED HOW LONG   Type of Anesthesia:  General    Additional requests/questions:    Wilhemina Cash   11/15/2022, 10:34 AM

## 2022-11-16 ENCOUNTER — Other Ambulatory Visit: Payer: Self-pay | Admitting: Neurosurgery

## 2022-11-19 ENCOUNTER — Other Ambulatory Visit: Payer: Self-pay

## 2022-11-19 NOTE — Pre-Procedure Instructions (Addendum)
PCP - Ignatius Specking, MD Cardiologist - Pricilla Riffle, MD  EKG - 09/24/22 ECHO - 02/18/23 Cardiac Cath - 04/26/20  Fasting Blood Sugar - 68-90 Checks Blood Sugar - wears continuous monitor  Aspirin Instructions: Last dose 11/18/22  Anesthesia review: Y  Patient verbally denies any shortness of breath, fever, cough and chest pain during phone call   -------------  SDW INSTRUCTIONS given:  Your procedure is scheduled on Tuesday, June 4th.  Report to [facility] Main Entrance "A" at 1245 P.M., and check in at the Admitting office.  Call this number if you have problems the morning of surgery:  [phone]   Remember:  Do not eat after midnight the night before your surgery  You may drink clear liquids until Noon the day of your surgery.   Clear liquids allowed are: Water, Non-Citrus Juices (without pulp), Carbonated Beverages, Clear Tea, Black Coffee Only, and Gatorade    Take these medicines the morning of surgery with A SIP OF WATER  acyclovir (ZOVIRAX)  ezetimibe (ZETIA)  metoprolol tartrate (LOPRESSOR)  omeprazole (PRILOSEC)  acetaminophen (TYLENOL)-if needed NITROSTAT-if needed  **DO NOT** take empagliflozin (JARDIANCE), glipiZIDE (GLUCOTROL XL) and metFORMIN (GLUCOPHAGE) the morning of surgery  **LANTUS** ONLY take 50% of your normal dose if needed for high blood sugar.  ** PLEASE check your blood sugar the morning of your surgery when you wake up and every 2 hours until you get to the Short Stay unit.  If your blood sugar is less than 70 mg/dL, you will need to treat for low blood sugar: Do not take insulin. Treat a low blood sugar (less than 70 mg/dL) with  cup of clear juice (cranberry or apple), 4 glucose tablets, OR glucose gel. Recheck blood sugar in 15 minutes after treatment (to make sure it is greater than 70 mg/dL). If your blood sugar is not greater than 70 mg/dL on recheck, call [phone] for further instructions.  As of today, STOP taking any Aspirin  (unless otherwise instructed by your surgeon) Aleve, Naproxen, Ibuprofen, Motrin, Advil, Goody's, BC's, all herbal medications, fish oil, and all vitamins.                      Do not wear jewelry, make up, or nail polish            Do not wear lotions, powders, perfumes/colognes, or deodorant.            Do not shave 48 hours prior to surgery.  Men may shave face and neck.            Do not bring valuables to the hospital.            [facility] is not responsible for any belongings or valuables.  Do NOT Smoke (Tobacco/Vaping) 24 hours prior to your procedure If you use a CPAP at night, you may bring all equipment for your overnight stay.   Contacts, glasses, dentures or bridgework may not be worn into surgery.      For patients admitted to the hospital, discharge time will be determined by your treatment team.   Patients discharged the day of surgery will not be allowed to drive home, and someone needs to stay with them for 24 hours.    Special instructions:   [place]- Preparing For Surgery  Before surgery, you can play an important role. Because skin is not sterile, your skin needs to be as free of germs as possible. You can reduce  the number of germs on your skin by washing with CHG (chlorahexidine gluconate) Soap before surgery.  CHG is an antiseptic cleaner which kills germs and bonds with the skin to continue killing germs even after washing.    Oral Hygiene is also important to reduce your risk of infection.  Remember - BRUSH YOUR TEETH THE MORNING OF SURGERY WITH YOUR REGULAR TOOTHPASTE  Please do not use if you have an allergy to CHG or antibacterial soaps. If your skin becomes reddened/irritated stop using the CHG.  Do not shave (including legs and underarms) for at least 48 hours prior to first CHG shower. It is OK to shave your face.  Please follow these instructions carefully.   Shower the NIGHT BEFORE SURGERY and the MORNING OF SURGERY with DIAL Soap.   Pat  yourself dry with a CLEAN TOWEL.  Wear CLEAN PAJAMAS to bed the night before surgery  Place CLEAN SHEETS on your bed the night of your first shower and DO NOT SLEEP WITH PETS.   Day of Surgery: Please shower morning of surgery  Wear Clean/Comfortable clothing the morning of surgery Do not apply any deodorants/lotions.   Remember to brush your teeth WITH YOUR REGULAR TOOTHPASTE.   Questions were answered. Patient verbalized understanding of instructions.

## 2022-11-19 NOTE — Progress Notes (Signed)
Anesthesia Chart Review:  same day workup  Follows with cardiology for history of CAD (s/p CABG in Nov 2021 (LIMA to LAD; SVG to PDA, PLSA; L radial to RI, OM1), HTN, HLD.  Noted to have had A-fib postop CABG.  Cardiac clearance per telephone encounter 11/15/2022 by Edd Fabian, NP, "Chart reviewed as part of pre-operative protocol coverage. Given past medical history and time since last visit, based on ACC/AHA guidelines, Katrina Castro would be at acceptable risk for the planned procedure without further cardiovascular testing.  Her RCRI is a class III risk, 6.6% risk of major cardiac event. Her aspirin may be held for 5-7 days prior to her surgery.  Please resume as soon as hemostasis is achieved."  Non-insulin-dependent DM2, A1c 7.6 on 08/02/2022.  Patient will need day of surgery labs and evaluation.  EKG 09/24/2022: NSR.  Rate 64.  TTE 02/17/2021:  1. Left ventricular ejection fraction, by estimation, is 60 to 65%. The  left ventricle has normal function. The left ventricle has no regional  wall motion abnormalities. There is mild left ventricular hypertrophy.  Left ventricular diastolic parameters  are indeterminate.   2. Right ventricular systolic function is mildly ro moderately reduced.  The right ventricular size is mildly enlarged. Tricuspid regurgitation  signal is inadequate for assessing PA pressure.   3. The mitral valve is grossly normal. Trivial mitral valve  regurgitation.   4. The aortic valve was not well visualized. Aortic valve regurgitation  is not visualized.   5. The inferior vena cava is normal in size with greater than 50%  respiratory variability, suggesting right atrial pressure of 3 mmHg.   Comparison(s): Prior images reviewed side by side. Images are quite  limited in comparison. RV appears more enlarged and with mildly to  moderately reduced function considering all views. Unable to estimate  RVSP.    Katrina Castro [facility] Short Stay  Center/Anesthesiology Phone [phone] 11/19/2022 1:51 PM'

## 2022-11-19 NOTE — Anesthesia Preprocedure Evaluation (Signed)
Anesthesia Evaluation  Patient identified by MRN, date of birth, ID band Patient awake    Reviewed: Allergy & Precautions, NPO status , Patient's Chart, lab work & pertinent test results  History of Anesthesia Complications Negative for: history of anesthetic complications  Airway Mallampati: II  TM Distance: >3 FB Neck ROM: Full    Dental  (+) Edentulous Upper, Dental Advisory Given, Missing   Pulmonary neg pulmonary ROS   breath sounds clear to auscultation       Cardiovascular hypertension, Pt. on medications and Pt. on home beta blockers (-) angina + CAD and + CABG (2021)  + dysrhythmias Atrial Fibrillation  Rhythm:Regular Rate:Normal  '22 ECHO: EF 60 to 65%.  1. The LV has normal function,  no regional wall motion abnormalities. There is mild LVH. Left ventricular diastolic parameters are indeterminate.   2. RVF is mildly ro moderately reduced. The right ventricular size is mildly enlarged.   3. The mitral valve is grossly normal. Trivial MR.   4. The aortic valve was not well visualized. Aortic valve regurgitation is not visualized.     Neuro/Psych  Headaches   Depression       GI/Hepatic Neg liver ROS,GERD  Medicated and Controlled,,  Endo/Other  diabetes (glu 114), Oral Hypoglycemic Agents, Insulin Dependent    Renal/GU negative Renal ROS     Musculoskeletal  (+) Arthritis ,    Abdominal   Peds  Hematology   Anesthesia Other Findings   Reproductive/Obstetrics                             Anesthesia Physical Anesthesia Plan  ASA: 3  Anesthesia Plan: General   Post-op Pain Management: Tylenol PO (pre-op)*   Induction: Intravenous  PONV Risk Score and Plan: 3 and Ondansetron, Dexamethasone and Treatment may vary due to age or medical condition  Airway Management Planned: Oral ETT  Additional Equipment: None  Intra-op Plan:   Post-operative Plan: Extubation in  OR  Informed Consent: I have reviewed the patients History and Physical, chart, labs and discussed the procedure including the risks, benefits and alternatives for the proposed anesthesia with the patient or authorized representative who has indicated his/her understanding and acceptance.     Dental advisory given  Plan Discussed with: CRNA and Surgeon  Anesthesia Plan Comments: (PAT note by Antionette Poles, PA-C: Follows with cardiology for history of CAD (s/p CABG in Nov 2021 (LIMA to LAD; SVG to PDA, PLSA; L radial to RI, OM1), HTN, HLD.  Noted to have had A-fib postop CABG.  Cardiac clearance per telephone encounter 11/15/2022 by Edd Fabian, NP, "Chart reviewed as part of pre-operative protocol coverage. Given past medical history and time since last visit, based on ACC/AHA guidelines, Katrina Castro would be at acceptable risk for the planned procedure without further cardiovascular testing.  Her RCRI is a class III risk, 6.6% risk of major cardiac event. Her aspirin may be held for 5-7 days prior to her surgery.  Please resume as soon as hemostasis is achieved."  Non-insulin-dependent DM2, A1c 7.6 on 08/02/2022.  Patient will need day of surgery labs and evaluation.  EKG 09/24/2022: NSR.  Rate 64.  TTE 02/17/2021: 1. Left ventricular ejection fraction, by estimation, is 60 to 65%. The  left ventricle has normal function. The left ventricle has no regional  wall motion abnormalities. There is mild left ventricular hypertrophy.  Left ventricular diastolic parameters  are indeterminate.  2. Right ventricular  systolic function is mildly ro moderately reduced.  The right ventricular size is mildly enlarged. Tricuspid regurgitation  signal is inadequate for assessing PA pressure.  3. The mitral valve is grossly normal. Trivial mitral valve  regurgitation.  4. The aortic valve was not well visualized. Aortic valve regurgitation  is not visualized.  5. The inferior vena cava is normal  in size with greater than 50%  respiratory variability, suggesting right atrial pressure of 3 mmHg.   Comparison(s): Prior images reviewed side by side. Images are quite  limited in comparison. RV appears more enlarged and with mildly to  moderately reduced function considering all views. Unable to estimate  RVSP.    )        Anesthesia Quick Evaluation

## 2022-11-20 ENCOUNTER — Other Ambulatory Visit: Payer: Self-pay

## 2022-11-20 ENCOUNTER — Encounter (HOSPITAL_COMMUNITY): Payer: Self-pay | Admitting: Neurosurgery

## 2022-11-20 ENCOUNTER — Observation Stay (HOSPITAL_COMMUNITY)
Admission: RE | Admit: 2022-11-20 | Discharge: 2022-11-21 | Disposition: A | Discharge status: Home / Self Care | Payer: Medicare Other | Source: Ambulatory Visit | Attending: Neurosurgery | Admitting: Neurosurgery

## 2022-11-20 ENCOUNTER — Encounter (HOSPITAL_COMMUNITY)
Admission: RE | Disposition: A | Discharge status: Home / Self Care | Payer: Self-pay | Source: Ambulatory Visit | Attending: Neurosurgery

## 2022-11-20 ENCOUNTER — Ambulatory Visit (HOSPITAL_COMMUNITY): Payer: Medicare Other | Admitting: Physician Assistant

## 2022-11-20 ENCOUNTER — Ambulatory Visit (HOSPITAL_COMMUNITY): Payer: Medicare Other

## 2022-11-20 ENCOUNTER — Ambulatory Visit (HOSPITAL_BASED_OUTPATIENT_CLINIC_OR_DEPARTMENT_OTHER): Payer: Medicare Other | Admitting: Physician Assistant

## 2022-11-20 DIAGNOSIS — Z794 Long term (current) use of insulin: Secondary | ICD-10-CM

## 2022-11-20 DIAGNOSIS — Z7984 Long term (current) use of oral hypoglycemic drugs: Secondary | ICD-10-CM | POA: Diagnosis not present

## 2022-11-20 DIAGNOSIS — Z96651 Presence of right artificial knee joint: Secondary | ICD-10-CM | POA: Insufficient documentation

## 2022-11-20 DIAGNOSIS — M5117 Intervertebral disc disorders with radiculopathy, lumbosacral region: Secondary | ICD-10-CM | POA: Diagnosis not present

## 2022-11-20 DIAGNOSIS — I2510 Atherosclerotic heart disease of native coronary artery without angina pectoris: Secondary | ICD-10-CM | POA: Insufficient documentation

## 2022-11-20 DIAGNOSIS — M5417 Radiculopathy, lumbosacral region: Secondary | ICD-10-CM | POA: Diagnosis not present

## 2022-11-20 DIAGNOSIS — I10 Essential (primary) hypertension: Secondary | ICD-10-CM | POA: Diagnosis not present

## 2022-11-20 DIAGNOSIS — I4891 Unspecified atrial fibrillation: Secondary | ICD-10-CM | POA: Diagnosis not present

## 2022-11-20 DIAGNOSIS — Z4789 Encounter for other orthopedic aftercare: Secondary | ICD-10-CM | POA: Diagnosis not present

## 2022-11-20 DIAGNOSIS — Z79899 Other long term (current) drug therapy: Secondary | ICD-10-CM | POA: Insufficient documentation

## 2022-11-20 DIAGNOSIS — M4807 Spinal stenosis, lumbosacral region: Secondary | ICD-10-CM

## 2022-11-20 DIAGNOSIS — Z951 Presence of aortocoronary bypass graft: Secondary | ICD-10-CM | POA: Diagnosis not present

## 2022-11-20 DIAGNOSIS — E119 Type 2 diabetes mellitus without complications: Secondary | ICD-10-CM | POA: Insufficient documentation

## 2022-11-20 DIAGNOSIS — Z7982 Long term (current) use of aspirin: Secondary | ICD-10-CM | POA: Insufficient documentation

## 2022-11-20 DIAGNOSIS — M48062 Spinal stenosis, lumbar region with neurogenic claudication: Secondary | ICD-10-CM | POA: Diagnosis not present

## 2022-11-20 DIAGNOSIS — Z981 Arthrodesis status: Secondary | ICD-10-CM | POA: Diagnosis not present

## 2022-11-20 HISTORY — PX: LUMBAR LAMINECTOMY/DECOMPRESSION MICRODISCECTOMY: SHX5026

## 2022-11-20 LAB — SURGICAL PCR SCREEN
MRSA, PCR: NEGATIVE
Staphylococcus aureus: NEGATIVE

## 2022-11-20 LAB — CBC
HCT: 44.2 % (ref 36.0–46.0)
Hemoglobin: 13.6 g/dL (ref 12.0–15.0)
MCH: 26.1 pg (ref 26.0–34.0)
MCHC: 30.8 g/dL (ref 30.0–36.0)
MCV: 84.8 fL (ref 80.0–100.0)
Platelets: 229 10*3/uL (ref 150–400)
RBC: 5.21 MIL/uL — ABNORMAL HIGH (ref 3.87–5.11)
RDW: 15.1 % (ref 11.5–15.5)
WBC: 11.1 10*3/uL — ABNORMAL HIGH (ref 4.0–10.5)
nRBC: 0 % (ref 0.0–0.2)

## 2022-11-20 LAB — GLUCOSE, CAPILLARY
Glucose-Capillary: 114 mg/dL — ABNORMAL HIGH (ref 70–99)
Glucose-Capillary: 98 mg/dL (ref 70–99)
Glucose-Capillary: 267 mg/dL — ABNORMAL HIGH (ref 70–99)

## 2022-11-20 LAB — BASIC METABOLIC PANEL
Anion gap: 11 (ref 5–15)
BUN: 18 mg/dL (ref 8–23)
CO2: 23 mmol/L (ref 22–32)
Calcium: 9.9 mg/dL (ref 8.9–10.3)
Chloride: 104 mmol/L (ref 98–111)
Creatinine, Ser: 0.73 mg/dL (ref 0.44–1.00)
GFR, Estimated: >60 mL/min (ref >60)
Glucose, Bld: 108 mg/dL — ABNORMAL HIGH (ref 70–99)
Potassium: 3.3 mmol/L — ABNORMAL LOW (ref 3.5–5.1)
Sodium: 138 mmol/L (ref 135–145)

## 2022-11-20 SURGERY — LUMBAR LAMINECTOMY/DECOMPRESSION MICRODISCECTOMY 1 LEVEL
Anesthesia: General | Site: Spine Lumbar | Laterality: Bilateral

## 2022-11-20 MED ORDER — ASPIRIN 81 MG PO TBEC
81.0000 mg | DELAYED_RELEASE_TABLET | Freq: Every day | ORAL | Status: DC
  Administered 2022-11-20: 81 mg via ORAL
  Filled 2022-11-20: qty 1

## 2022-11-20 MED ORDER — KETOROLAC TROMETHAMINE 30 MG/ML IJ SOLN
INTRAMUSCULAR | Status: DC | PRN
  Administered 2022-11-20: 30 mg via INTRAVENOUS

## 2022-11-20 MED ORDER — INSULIN GLARGINE-YFGN 100 UNIT/ML ~~LOC~~ SOLN
10.0000 [IU] | Freq: Every day | SUBCUTANEOUS | Status: DC
  Administered 2022-11-20: 10 [IU] via SUBCUTANEOUS
  Filled 2022-11-20 (×2): qty 0.1

## 2022-11-20 MED ORDER — LACTATED RINGERS IV SOLN: INTRAVENOUS | Status: DC

## 2022-11-20 MED ORDER — PHENOL 1.4 % MT LIQD: 1.0000 | OROMUCOSAL | Status: DC | PRN

## 2022-11-20 MED ORDER — MIDAZOLAM HCL 2 MG/2ML IJ SOLN
INTRAMUSCULAR | Status: AC
  Filled 2022-11-20: qty 2

## 2022-11-20 MED ORDER — BUPIVACAINE HCL (PF) 0.25 % IJ SOLN
INTRAMUSCULAR | Status: AC
  Filled 2022-11-20: qty 30

## 2022-11-20 MED ORDER — PROPOFOL 10 MG/ML IV BOLUS
INTRAVENOUS | Status: DC | PRN
  Administered 2022-11-20: 120 mg via INTRAVENOUS

## 2022-11-20 MED ORDER — METFORMIN HCL 500 MG PO TABS
1000.0000 mg | ORAL_TABLET | Freq: Two times a day (BID) | ORAL | Status: DC
  Administered 2022-11-20: 1000 mg via ORAL
  Filled 2022-11-20: qty 2

## 2022-11-20 MED ORDER — DEXAMETHASONE SODIUM PHOSPHATE 10 MG/ML IJ SOLN
INTRAMUSCULAR | Status: DC | PRN
  Administered 2022-11-20: 10 mg via INTRAVENOUS

## 2022-11-20 MED ORDER — KETOROLAC TROMETHAMINE 15 MG/ML IJ SOLN
15.0000 mg | Freq: Four times a day (QID) | INTRAMUSCULAR | Status: DC
  Administered 2022-11-20 – 2022-11-21 (×2): 15 mg via INTRAVENOUS
  Filled 2022-11-20 (×3): qty 1

## 2022-11-20 MED ORDER — AMISULPRIDE (ANTIEMETIC) 5 MG/2ML IV SOLN
INTRAVENOUS | Status: AC
  Filled 2022-11-20: qty 4

## 2022-11-20 MED ORDER — PROMETHAZINE HCL 25 MG/ML IJ SOLN: 6.2500 mg | INTRAMUSCULAR | Status: DC | PRN

## 2022-11-20 MED ORDER — OXYCODONE HCL 5 MG/5ML PO SOLN: 5.0000 mg | Freq: Once | ORAL | Status: DC | PRN

## 2022-11-20 MED ORDER — EZETIMIBE 10 MG PO TABS: 10.0000 mg | ORAL_TABLET | Freq: Every day | ORAL | Status: DC

## 2022-11-20 MED ORDER — SODIUM CHLORIDE 0.9% FLUSH
3.0000 mL | Freq: Two times a day (BID) | INTRAVENOUS | Status: DC
  Administered 2022-11-20: 3 mL via INTRAVENOUS

## 2022-11-20 MED ORDER — HYDROCODONE-ACETAMINOPHEN 10-325 MG PO TABS
1.0000 | ORAL_TABLET | ORAL | Status: DC | PRN
  Administered 2022-11-20: 1 via ORAL
  Filled 2022-11-20: qty 2

## 2022-11-20 MED ORDER — ROCURONIUM BROMIDE 10 MG/ML (PF) SYRINGE
PREFILLED_SYRINGE | INTRAVENOUS | Status: DC | PRN
  Administered 2022-11-20: 60 mg via INTRAVENOUS

## 2022-11-20 MED ORDER — MIDAZOLAM HCL 2 MG/2ML IJ SOLN
INTRAMUSCULAR | Status: DC | PRN
  Administered 2022-11-20: 1 mg via INTRAVENOUS

## 2022-11-20 MED ORDER — CHLORHEXIDINE GLUCONATE CLOTH 2 % EX PADS: 6.0000 | MEDICATED_PAD | Freq: Once | CUTANEOUS | Status: DC

## 2022-11-20 MED ORDER — FERROUS SULFATE 325 (65 FE) MG PO TABS: 325.0000 mg | ORAL_TABLET | ORAL | Status: DC

## 2022-11-20 MED ORDER — PHENYLEPHRINE HCL-NACL 20-0.9 MG/250ML-% IV SOLN
INTRAVENOUS | Status: DC | PRN
  Administered 2022-11-20: 10 ug/min via INTRAVENOUS

## 2022-11-20 MED ORDER — PANTOPRAZOLE SODIUM 40 MG PO TBEC
40.0000 mg | DELAYED_RELEASE_TABLET | Freq: Every day | ORAL | Status: DC
  Administered 2022-11-20: 40 mg via ORAL
  Filled 2022-11-20: qty 1

## 2022-11-20 MED ORDER — THROMBIN (RECOMBINANT) 5000 UNITS EX SOLR: CUTANEOUS | Status: DC | PRN

## 2022-11-20 MED ORDER — ALPRAZOLAM 0.25 MG PO TABS
0.2500 mg | ORAL_TABLET | Freq: Every evening | ORAL | Status: DC | PRN
  Administered 2022-11-20: 0.25 mg via ORAL
  Filled 2022-11-20: qty 1

## 2022-11-20 MED ORDER — THROMBIN 5000 UNITS EX SOLR
CUTANEOUS | Status: AC
  Filled 2022-11-20: qty 10000

## 2022-11-20 MED ORDER — SODIUM CHLORIDE 0.9% FLUSH: 3.0000 mL | INTRAVENOUS | Status: DC | PRN

## 2022-11-20 MED ORDER — EMPAGLIFLOZIN 25 MG PO TABS
25.0000 mg | ORAL_TABLET | Freq: Every day | ORAL | Status: DC
  Administered 2022-11-21: 25 mg via ORAL
  Filled 2022-11-20: qty 1

## 2022-11-20 MED ORDER — SUGAMMADEX SODIUM 200 MG/2ML IV SOLN
INTRAVENOUS | Status: DC | PRN
  Administered 2022-11-20: 118.8 mg via INTRAVENOUS

## 2022-11-20 MED ORDER — KETOROLAC TROMETHAMINE 30 MG/ML IJ SOLN
INTRAMUSCULAR | Status: AC
  Filled 2022-11-20: qty 1

## 2022-11-20 MED ORDER — ORAL CARE MOUTH RINSE: 15.0000 mL | Freq: Once | OROMUCOSAL | Status: AC

## 2022-11-20 MED ORDER — ROSUVASTATIN CALCIUM 20 MG PO TABS
40.0000 mg | ORAL_TABLET | Freq: Every evening | ORAL | Status: DC
  Administered 2022-11-20: 40 mg via ORAL
  Filled 2022-11-20: qty 2

## 2022-11-20 MED ORDER — LEVOCETIRIZINE DIHYDROCHLORIDE 5 MG PO TABS: 5.0000 mg | ORAL_TABLET | Freq: Every evening | ORAL | Status: DC

## 2022-11-20 MED ORDER — NITROGLYCERIN 0.4 MG SL SUBL: 0.4000 mg | SUBLINGUAL_TABLET | SUBLINGUAL | Status: DC | PRN

## 2022-11-20 MED ORDER — SODIUM CHLORIDE 0.9 % IV SOLN
250.0000 mL | INTRAVENOUS | Status: DC
  Administered 2022-11-20: 250 mL via INTRAVENOUS

## 2022-11-20 MED ORDER — ACETAMINOPHEN 650 MG RE SUPP: 650.0000 mg | RECTAL | Status: DC | PRN

## 2022-11-20 MED ORDER — LORATADINE 10 MG PO TABS
10.0000 mg | ORAL_TABLET | Freq: Every evening | ORAL | Status: DC
  Administered 2022-11-20: 10 mg via ORAL
  Filled 2022-11-20: qty 1

## 2022-11-20 MED ORDER — ALBUMIN HUMAN 5 % IV SOLN: INTRAVENOUS | Status: DC | PRN

## 2022-11-20 MED ORDER — HYDROCODONE-ACETAMINOPHEN 5-325 MG PO TABS
1.0000 | ORAL_TABLET | ORAL | Status: DC | PRN
  Administered 2022-11-20 – 2022-11-21 (×2): 1 via ORAL
  Filled 2022-11-20 (×2): qty 1

## 2022-11-20 MED ORDER — FENTANYL CITRATE (PF) 250 MCG/5ML IJ SOLN
INTRAMUSCULAR | Status: AC
  Filled 2022-11-20: qty 5

## 2022-11-20 MED ORDER — MENTHOL 3 MG MT LOZG: 1.0000 | LOZENGE | OROMUCOSAL | Status: DC | PRN

## 2022-11-20 MED ORDER — OXYBUTYNIN CHLORIDE ER 10 MG PO TB24
10.0000 mg | ORAL_TABLET | Freq: Every evening | ORAL | Status: DC
  Administered 2022-11-20: 10 mg via ORAL
  Filled 2022-11-20: qty 1

## 2022-11-20 MED ORDER — 0.9 % SODIUM CHLORIDE (POUR BTL) OPTIME
TOPICAL | Status: DC | PRN
  Administered 2022-11-20: 1000 mL

## 2022-11-20 MED ORDER — SODIUM CHLORIDE 0.9 % IV SOLN: INTRAVENOUS | Status: DC | PRN

## 2022-11-20 MED ORDER — ONDANSETRON HCL 4 MG/2ML IJ SOLN: 4.0000 mg | Freq: Four times a day (QID) | INTRAMUSCULAR | Status: DC | PRN

## 2022-11-20 MED ORDER — CEFAZOLIN SODIUM-DEXTROSE 1-4 GM/50ML-% IV SOLN
1.0000 g | Freq: Three times a day (TID) | INTRAVENOUS | Status: AC
  Administered 2022-11-20 – 2022-11-21 (×2): 1 g via INTRAVENOUS
  Filled 2022-11-20 (×2): qty 50

## 2022-11-20 MED ORDER — MIDAZOLAM HCL 2 MG/2ML IJ SOLN: 0.5000 mg | Freq: Once | INTRAMUSCULAR | Status: DC | PRN

## 2022-11-20 MED ORDER — OXYCODONE HCL 5 MG PO TABS: 5.0000 mg | ORAL_TABLET | Freq: Once | ORAL | Status: DC | PRN

## 2022-11-20 MED ORDER — VITAMIN D 25 MCG (1000 UNIT) PO TABS
2000.0000 [IU] | ORAL_TABLET | Freq: Every day | ORAL | Status: DC
  Administered 2022-11-20: 2000 [IU] via ORAL
  Filled 2022-11-20: qty 2

## 2022-11-20 MED ORDER — GLIPIZIDE ER 2.5 MG PO TB24
2.5000 mg | ORAL_TABLET | Freq: Every day | ORAL | Status: DC
  Filled 2022-11-20: qty 1

## 2022-11-20 MED ORDER — ACETAMINOPHEN 325 MG PO TABS: 650.0000 mg | ORAL_TABLET | ORAL | Status: DC | PRN

## 2022-11-20 MED ORDER — HYDROMORPHONE HCL 1 MG/ML IJ SOLN: 0.2500 mg | INTRAMUSCULAR | Status: DC | PRN

## 2022-11-20 MED ORDER — CYCLOBENZAPRINE HCL 10 MG PO TABS
10.0000 mg | ORAL_TABLET | Freq: Three times a day (TID) | ORAL | Status: DC | PRN
  Administered 2022-11-20: 10 mg via ORAL
  Filled 2022-11-20: qty 1

## 2022-11-20 MED ORDER — INSULIN ASPART 100 UNIT/ML IJ SOLN
0.0000 [IU] | Freq: Three times a day (TID) | INTRAMUSCULAR | Status: DC
  Administered 2022-11-21: 2 [IU] via SUBCUTANEOUS

## 2022-11-20 MED ORDER — FENTANYL CITRATE (PF) 250 MCG/5ML IJ SOLN
INTRAMUSCULAR | Status: DC | PRN
  Administered 2022-11-20: 100 ug via INTRAVENOUS
  Administered 2022-11-20 (×2): 50 ug via INTRAVENOUS

## 2022-11-20 MED ORDER — METOPROLOL TARTRATE 12.5 MG HALF TABLET: 12.5000 mg | ORAL_TABLET | Freq: Two times a day (BID) | ORAL | Status: DC

## 2022-11-20 MED ORDER — BUPIVACAINE HCL (PF) 0.25 % IJ SOLN
INTRAMUSCULAR | Status: DC | PRN
  Administered 2022-11-20: 20 mL

## 2022-11-20 MED ORDER — AMISULPRIDE (ANTIEMETIC) 5 MG/2ML IV SOLN
10.0000 mg | Freq: Once | INTRAVENOUS | Status: AC | PRN
  Administered 2022-11-20: 10 mg via INTRAVENOUS

## 2022-11-20 MED ORDER — ONDANSETRON HCL 4 MG/2ML IJ SOLN
INTRAMUSCULAR | Status: DC | PRN
  Administered 2022-11-20: 4 mg via INTRAVENOUS

## 2022-11-20 MED ORDER — LIDOCAINE 2% (20 MG/ML) 5 ML SYRINGE
INTRAMUSCULAR | Status: DC | PRN
  Administered 2022-11-20: 60 mg via INTRAVENOUS

## 2022-11-20 MED ORDER — CHLORHEXIDINE GLUCONATE 0.12 % MT SOLN
15.0000 mL | Freq: Once | OROMUCOSAL | Status: AC
  Administered 2022-11-20: 15 mL via OROMUCOSAL
  Filled 2022-11-20: qty 15

## 2022-11-20 MED ORDER — HYDROMORPHONE HCL 1 MG/ML IJ SOLN: 1.0000 mg | INTRAMUSCULAR | Status: DC | PRN

## 2022-11-20 MED ORDER — ONDANSETRON HCL 4 MG PO TABS: 4.0000 mg | ORAL_TABLET | Freq: Four times a day (QID) | ORAL | Status: DC | PRN

## 2022-11-20 MED ORDER — INSULIN ASPART 100 UNIT/ML IJ SOLN: 0.0000 [IU] | INTRAMUSCULAR | Status: DC | PRN

## 2022-11-20 MED ORDER — PROPOFOL 500 MG/50ML IV EMUL
INTRAVENOUS | Status: DC | PRN
  Administered 2022-11-20: 50 ug/kg/min via INTRAVENOUS

## 2022-11-20 MED ORDER — CEFAZOLIN SODIUM-DEXTROSE 2-4 GM/100ML-% IV SOLN
2.0000 g | INTRAVENOUS | Status: AC
  Administered 2022-11-20: 2 g via INTRAVENOUS
  Filled 2022-11-20: qty 100

## 2022-11-20 SURGICAL SUPPLY — 48 items
ADH SKN CLS APL DERMABOND .7 (GAUZE/BANDAGES/DRESSINGS) ×1
APL SKNCLS STERI-STRIP NONHPOA (GAUZE/BANDAGES/DRESSINGS) ×1
BAG COUNTER SPONGE SURGICOUNT (BAG) ×1 IMPLANT
BAG DECANTER FOR FLEXI CONT (MISCELLANEOUS) ×1 IMPLANT
BAG SPNG CNTER NS LX DISP (BAG) ×1
BENZOIN TINCTURE PRP APPL 2/3 (GAUZE/BANDAGES/DRESSINGS) ×1 IMPLANT
BLADE CLIPPER SURG (BLADE) IMPLANT
BUR CUTTER 7.0 ROUND (BURR) ×1 IMPLANT
BUR MATCHSTICK NEURO 3.0 LAGG (BURR) IMPLANT
CANISTER SUCT 3000ML PPV (MISCELLANEOUS) ×1 IMPLANT
DERMABOND ADVANCED .7 DNX12 (GAUZE/BANDAGES/DRESSINGS) ×1 IMPLANT
DRAPE HALF SHEET 40X57 (DRAPES) IMPLANT
DRAPE LAPAROTOMY 100X72X124 (DRAPES) ×1 IMPLANT
DRAPE MICROSCOPE SLANT 54X150 (MISCELLANEOUS) ×1 IMPLANT
DRAPE SURG 17X23 STRL (DRAPES) ×2 IMPLANT
DRSG OPSITE POSTOP 4X6 (GAUZE/BANDAGES/DRESSINGS) IMPLANT
DURAPREP 26ML APPLICATOR (WOUND CARE) ×1 IMPLANT
ELECT REM PT RETURN 9FT ADLT (ELECTROSURGICAL) ×1
ELECTRODE REM PT RTRN 9FT ADLT (ELECTROSURGICAL) ×1 IMPLANT
GAUZE 4X4 16PLY ~~LOC~~+RFID DBL (SPONGE) IMPLANT
GAUZE SPONGE 4X4 12PLY STRL (GAUZE/BANDAGES/DRESSINGS) ×1 IMPLANT
GLOVE BIO SURGEON STRL SZ 6.5 (GLOVE) ×1 IMPLANT
GLOVE BIOGEL PI IND STRL 6.5 (GLOVE) ×1 IMPLANT
GLOVE ECLIPSE 9.0 STRL (GLOVE) ×1 IMPLANT
GLOVE EXAM NITRILE XL STR (GLOVE) IMPLANT
GOWN STRL REUS W/ TWL LRG LVL3 (GOWN DISPOSABLE) IMPLANT
GOWN STRL REUS W/ TWL XL LVL3 (GOWN DISPOSABLE) ×1 IMPLANT
GOWN STRL REUS W/TWL 2XL LVL3 (GOWN DISPOSABLE) IMPLANT
GOWN STRL REUS W/TWL LRG LVL3 (GOWN DISPOSABLE)
GOWN STRL REUS W/TWL XL LVL3 (GOWN DISPOSABLE) ×1
KIT BASIN OR (CUSTOM PROCEDURE TRAY) ×1 IMPLANT
KIT TURNOVER KIT B (KITS) ×1 IMPLANT
NDL SPNL 22GX3.5 QUINCKE BK (NEEDLE) IMPLANT
NEEDLE HYPO 22GX1.5 SAFETY (NEEDLE) ×1 IMPLANT
NEEDLE SPNL 22GX3.5 QUINCKE BK (NEEDLE) IMPLANT
NS IRRIG 1000ML POUR BTL (IV SOLUTION) ×1 IMPLANT
PACK LAMINECTOMY NEURO (CUSTOM PROCEDURE TRAY) ×1 IMPLANT
PAD ARMBOARD 7.5X6 YLW CONV (MISCELLANEOUS) ×3 IMPLANT
SOL ELECTROSURG ANTI STICK (MISCELLANEOUS) ×1
SOLUTION ELECTROSURG ANTI STCK (MISCELLANEOUS) ×1 IMPLANT
SPIKE FLUID TRANSFER (MISCELLANEOUS) ×1 IMPLANT
SPONGE SURGIFOAM ABS GEL SZ50 (HEMOSTASIS) ×1 IMPLANT
STRIP CLOSURE SKIN 1/2X4 (GAUZE/BANDAGES/DRESSINGS) ×1 IMPLANT
SUT VIC AB 2-0 CT1 18 (SUTURE) ×1 IMPLANT
SUT VIC AB 3-0 SH 8-18 (SUTURE) ×1 IMPLANT
TOWEL GREEN STERILE (TOWEL DISPOSABLE) ×1 IMPLANT
TOWEL GREEN STERILE FF (TOWEL DISPOSABLE) ×1 IMPLANT
WATER STERILE IRR 1000ML POUR (IV SOLUTION) ×1 IMPLANT

## 2022-11-20 NOTE — Transfer of Care (Signed)
Immediate Anesthesia Transfer of Care Note  Patient: Katrina Castro [facility]  Procedure(s) Performed: Lumbar Five-Sacral One Laminectomy and Foraminotomy (Bilateral: Spine Lumbar)  Patient Location: PACU  Anesthesia Type:General  Level of Consciousness: awake, alert , and oriented  Airway & Oxygen Therapy: Patient Spontanous Breathing  Post-op Assessment: Report given to RN and Post -op Vital signs reviewed and stable  Post vital signs: Reviewed and stable  Last Vitals:  Vitals Value Taken Time  BP 122/48 11/20/22 1645  Temp 36.4 C 11/20/22 1638  Pulse 69 11/20/22 1646  Resp 15 11/20/22 1646  SpO2 100 % 11/20/22 1646  Vitals shown include unvalidated device data.  Last Pain:  Vitals:   11/20/22 1638  TempSrc:   PainSc: 0-No pain      Patients Stated Pain Goal: 3 (11/20/22 1321)  Complications: No notable events documented.

## 2022-11-20 NOTE — Anesthesia Postprocedure Evaluation (Signed)
Anesthesia Post Note  Patient: Katrina Castro  Procedure(s) Performed: Lumbar Five-Sacral One Laminectomy and Foraminotomy (Bilateral: Spine Lumbar)     Patient location during evaluation: PACU Anesthesia Type: General Level of consciousness: awake and alert, patient cooperative and oriented Pain management: pain level controlled Vital Signs Assessment: post-procedure vital signs reviewed and stable Respiratory status: spontaneous breathing, nonlabored ventilation, respiratory function stable and patient connected to nasal cannula oxygen Cardiovascular status: blood pressure returned to baseline and stable : nausea relieved. Anesthetic complications: no   No notable events documented.  Last Vitals:  Vitals:   11/20/22 1715 11/20/22 1746  BP: 119/66 (!) 130/93  Pulse: 74 71  Resp: 20 20  Temp: 36.4 C 36.6 C  SpO2: 100% 100%    Last Pain:  Vitals:   11/20/22 1746  TempSrc: Oral  PainSc:                  Tennile Styles,E. Aimee Timmons

## 2022-11-20 NOTE — Anesthesia Procedure Notes (Signed)
Procedure Name: Intubation Date/Time: 11/20/2022 3:00 PM  Performed by: Darryl Nestle, CRNAPre-anesthesia Checklist: Patient identified, Emergency Drugs available, Suction available and Patient being monitored Patient Re-evaluated:Patient Re-evaluated prior to induction Oxygen Delivery Method: Circle system utilized Preoxygenation: Pre-oxygenation with 100% oxygen Induction Type: IV induction Ventilation: Mask ventilation without difficulty Laryngoscope Size: Mac and 3 Grade View: Grade II Tube type: Oral Number of attempts: 1 Airway Equipment and Method: Stylet and Oral airway Placement Confirmation: ETT inserted through vocal cords under direct vision, positive ETCO2 and breath sounds checked- equal and bilateral Secured at: 21 cm Tube secured with: Tape Dental Injury: Teeth and Oropharynx as per pre-operative assessment

## 2022-11-20 NOTE — Op Note (Signed)
Date of procedure: 11/20/2022  Date of dictation: Same  Service: Neurosurgery  Preoperative diagnosis: Bilateral L5-S1 stenosis with radiculopathy  Postoperative diagnosis: Same  Procedure Name: Bilateral L5-S1 decompressive laminotomies and foraminotomies  Surgeon:Oksana Deberry A.Michaella Imai, M.D.  Asst. Surgeon: Doran Durand, NP  Anesthesia: General  Indication: 77 year old female with severe bilateral lower extremity pain numbness and weakness consistent with bilateral S1 radiculopathies.  Workup demonstrates evidence of severe spondylosis and stenosis at L5-S1 with severe compression of the thecal sac and bilateral S1 nerve roots.  Patient presents now for decompressive surgery in hopes of improving her symptoms.  Operative note: After induction anesthesia, patient positioned prone on the Wilson frame and properly padded.  Lumbar region prepped and draped sterilely.  Incision made overlying L5-S1.  Dissection performed bilaterally.  Retractor placed.  X-ray taken.  Level confirmed.  Decompressive laminotomy was then performed using high-speed drill and Kerrison rongeurs to remove the inferior aspect of the lamina of L5 the medial aspect the L5-S1 facet joint and the superior rim of the S1 lamina bilaterally.  Ligamentum flavum was elevated.  Microscope brought in field used microdissection.  Starting first on the left side the ligament was mobilized and resected.  Foraminotomies complete on the course exiting L5 and S1 nerve roots on the left side.  Attention then placed to the right side.  The ligament was densely adherent to the thecal sac.  This was circumferentially dissected and removed.  The thecal sac and nerve roots were uninjured.  Foraminotomies on the right side were completed on course exiting L5 and S1 nerve roots.  At this point a very thorough decompression been achieved.  There was no evidence of injury to the thecal sac and nerve roots.  The wound was then irrigated.  Gelfoam was placed topically  for hemostasis.  Wounds and closed in layers with Vicryl sutures.  Steri-Strips and sterile dressing were applied.  No apparent complications.  Patient tolerated the procedure well and she returns to the recovery room postop.

## 2022-11-20 NOTE — H&P (Signed)
Katrina Castro is an 77 y.o. female.   Chief Complaint: Leg pain HPI: 77 year old female with severe bilateral lower extremity pain numbness and weakness consistent with bilateral S1 radiculopathies.  Workup demonstrates evidence of severe spinal stenosis at L5-S1 right greater than left.  Patient presents now for bilateral decompressive laminotomies and foraminotomies at L5-S1 in hopes of improving her symptoms.  Past Medical History:  Diagnosis Date   Arthritis    CAD (coronary artery disease)    a. s/p CABG on 04/27/2020 with LIMA-LAD, seq SVG-PDA-PL, seq left radial-RI-OM   Concussion    2015   Depression    Diabetes (HCC)    type 2   Dysphagia    Dysrhythmia    afib   GERD (gastroesophageal reflux disease)    Headache    History of bronchitis    Hypertension    Seasonal allergies    Toenail fungus     Past Surgical History:  Procedure Laterality Date   APPENDECTOMY     BACK SURGERY     x2   BOTOX INJECTION N/A 06/06/2016   Procedure: BOTOX INJECTION;  Surgeon: Malissa Hippo, MD;  [facility];  Service: Endoscopy;  Laterality: N/A;   BOTOX INJECTION N/A 01/17/2021   Procedure: BOTOX INJECTION;  Surgeon: Dolores Frame, MD;  [facility]: [facility];  Service: Gastroenterology;  Laterality: N/A;   BOTOX INJECTION N/A 07/11/2021   Procedure: BOTOX INJECTION;  Surgeon: Dolores Frame, MD;  [facility];  Service: Gastroenterology;  Laterality: N/A;   BOTOX INJECTION N/A 08/21/2022   Procedure: BOTOX INJECTION;  Surgeon: Dolores Frame, MD;  [facility];  Service: Gastroenterology;  Laterality: N/A;   CHOLECYSTECTOMY     COLONOSCOPY N/A 11/05/2012   Procedure: COLONOSCOPY;  Surgeon: Malissa Hippo, MD;  [facility];  Service: Endoscopy;  Laterality: N/A;  1030   COLONOSCOPY WITH PROPOFOL N/A 05/01/2019   Procedure: COLONOSCOPY WITH PROPOFOL;  Surgeon: Malissa Hippo, MD;  [facility];  Service: Endoscopy;  Laterality: N/A;  11:20am   CORONARY ARTERY BYPASS GRAFT N/A 04/27/2020   Procedure: CORONARY ARTERY BYPASS GRAFTING (CABG) TIMES FIVE USING LEFT INTERNAL MAMMARY ARTERY, LEFT HARVESTED RADIAL ARTERY, RIGHT GREATER SAPHENOUS VEIN HARVESTED ENDOSCOPICALLY.;  Surgeon: Linden Dolin, MD;  Location: [facility];  Service: Open Heart Surgery;  Laterality: N/A;   ESOPHAGEAL DILATION N/A 07/06/2015   Procedure: ESOPHAGEAL DILATION;  Surgeon: Malissa Hippo, MD;  [facility];  Service: Endoscopy;  Laterality: N/A;   ESOPHAGEAL DILATION N/A 06/06/2016   Procedure: ESOPHAGEAL DILATION;  Surgeon: Malissa Hippo, MD;  [facility];  Service: Endoscopy;  Laterality: N/A;   ESOPHAGEAL DILATION N/A 08/21/2022   Procedure: ESOPHAGEAL DILATION;  Surgeon: Dolores Frame, MD;  [facility];  Service: Gastroenterology;  Laterality: N/A;   ESOPHAGOGASTRODUODENOSCOPY N/A 02/12/2014   Procedure: ESOPHAGOGASTRODUODENOSCOPY (EGD);  Surgeon: Malissa Hippo, MD;  [facility];  Service: Endoscopy;  Laterality: N/A;  150   ESOPHAGOGASTRODUODENOSCOPY N/A 07/06/2015   Procedure: ESOPHAGOGASTRODUODENOSCOPY (EGD);  Surgeon: Malissa Hippo, MD;  [facility];  Service: Endoscopy;  Laterality: N/A;  1:25 - moved to 1/18 @ 10:30 - Ann to notify pt   ESOPHAGOGASTRODUODENOSCOPY (EGD) WITH ESOPHAGEAL DILATION N/A 07/25/2012   Procedure: ESOPHAGOGASTRODUODENOSCOPY (EGD) WITH ESOPHAGEAL DILATION;  Surgeon: Malissa Hippo, MD;  [facility];  Service: Endoscopy;  Laterality: N/A;  325-rescheduled to  855 Ann notified pt   ESOPHAGOGASTRODUODENOSCOPY (EGD) WITH PROPOFOL N/A 06/06/2016   Procedure: ESOPHAGOGASTRODUODENOSCOPY (EGD) WITH PROPOFOL;  Surgeon: Malissa Hippo, MD;  [facility];  Service: Endoscopy;  Laterality: N/A;   ESOPHAGOGASTRODUODENOSCOPY (EGD) WITH PROPOFOL N/A 01/17/2021   Procedure: ESOPHAGOGASTRODUODENOSCOPY  (EGD) WITH PROPOFOL;  Surgeon: Dolores Frame, MD;  [facility];  Service: Gastroenterology;  Laterality: N/A;  10:35   ESOPHAGOGASTRODUODENOSCOPY (EGD) WITH PROPOFOL N/A 07/11/2021   Procedure: ESOPHAGOGASTRODUODENOSCOPY (EGD) WITH PROPOFOL;  Surgeon: Dolores Frame, MD;  [facility];  Service: Gastroenterology;  Laterality: N/A;  11:15   ESOPHAGOGASTRODUODENOSCOPY (EGD) WITH PROPOFOL N/A 08/21/2022   Procedure: ESOPHAGOGASTRODUODENOSCOPY (EGD) WITH PROPOFOL;  Surgeon: Dolores Frame, MD;  [facility];  Service: Gastroenterology;  Laterality: N/A;  1115AM, ASA 2   EYE SURGERY     cataracts removed   Foot surgeries Bilateral    hammer toes   LEFT HEART CATH AND CORONARY ANGIOGRAPHY N/A 04/26/2020   Procedure: LEFT HEART CATH AND CORONARY ANGIOGRAPHY;  Surgeon: [place], Peter M, MD;  Location[facility];  Service: Cardiovascular;  Laterality: N/A;   MALONEY DILATION N/A 02/12/2014   Procedure: Elease Hashimoto DILATION;  Surgeon: Malissa Hippo, MD;  [facility];  Service: Endoscopy;  Laterality: N/A;   POLYPECTOMY  05/01/2019   Procedure: POLYPECTOMY;  Surgeon: Malissa Hippo, MD;  [facility];  Service: Endoscopy;;  colon    RADIAL ARTERY HARVEST Left 04/27/2020   Procedure: LEFT RADIAL ARTERY HARVEST;  Surgeon: Linden Dolin, MD;  Location: [facility];  Service: Open Heart Surgery;  Laterality: Left;   Right knee arthroscopy     x2   TEE WITHOUT CARDIOVERSION N/A 04/27/2020   Procedure: TRANSESOPHAGEAL ECHOCARDIOGRAM (TEE);  Surgeon: Linden Dolin, MD;  Location: [facility] OR;  Service: Open Heart Surgery;  Laterality: N/A;   TONSILLECTOMY     TOTAL ABDOMINAL HYSTERECTOMY     TOTAL KNEE ARTHROPLASTY Right 04/03/2017   Procedure: RIGHT TOTAL KNEE ARTHROPLASTY;  Surgeon: Ranee Gosselin, MD;  Location: [facility];  Service: Orthopedics;  Laterality: Right;    Family History  Problem Relation Age of Onset    Diabetes Mother    Stroke Father    Diabetes Sister    Colon cancer Neg Hx    Breast cancer Neg Hx    Social History:  reports that she has never smoked. She has never used smokeless tobacco. She reports that she does not drink alcohol and does not use drugs.  Allergies:  Allergies  Allergen Reactions   Bee Venom Anaphylaxis   Levofloxacin Anaphylaxis and Rash   Cardizem [Diltiazem] Other (See Comments)    Patient states that she could not think, felt that she was in the twilight zone. Arm discomfort.   Sulfa Antibiotics Itching   Diazepam Nausea Only   Lipitor [Atorvastatin] Other (See Comments)    Caused her body to be "out of whack"  - elevated sugar also.    Medications Prior to Admission  Medication Sig Dispense Refill   ALPRAZolam (XANAX) 0.5 MG tablet Take 0.25 mg by mouth at bedtime as needed for sleep.     aspirin 81 MG EC tablet Take 1 tablet (81 mg total) by mouth daily. Swallow whole. 30 tablet 11   Biotin w/ Vitamins C & E (HAIR SKIN & NAILS GUMMIES PO) Take 2 capsules by mouth daily.     Calcium-Phosphorus-Vitamin D (CALCIUM GUMMIES PO) Take 1 tablet  by mouth daily. Gummy     Cholecalciferol (VITAMIN D) 50 MCG (2000 UT) tablet Take 2,000 Units by mouth daily.     Cyanocobalamin (B-12 PO) Take 1 tablet by mouth daily. Gummies     ELDERBERRY PO Take 1 tablet by mouth daily. Gummy     empagliflozin (JARDIANCE) 25 MG TABS tablet TAKE 1 TABLET BY MOUTH DAILY BEFORE BREAKFAST. 90 tablet 3   ezetimibe (ZETIA) 10 MG tablet TAKE 1 TABLET ONCE DAILY. 90 tablet 3   ferrous sulfate 325 (65 FE) MG EC tablet Take 325 mg by mouth 2 (two) times a week.     glipiZIDE (GLUCOTROL XL) 2.5 MG 24 hr tablet Take 1 tablet (2.5 mg total) by mouth daily with breakfast. 90 tablet 3   LANTUS SOLOSTAR 100 UNIT/ML Solostar Pen Inject 10 Units into the skin daily as needed (high blood glucose).     levocetirizine (XYZAL) 5 MG tablet Take 5 mg by mouth every evening.     Menthol-Methyl Salicylate  (MUSCLE RUB EX) Apply 1 application topically daily as needed (muscle pain). [place] gel     metFORMIN (GLUCOPHAGE) 1000 MG tablet Take 1,000 mg by mouth 2 (two) times daily.     metoprolol tartrate (LOPRESSOR) 25 MG tablet TAKE (1/2) TABLET BY MOUTH 2 TIMES A DAY. 90 tablet 3   omeprazole (PRILOSEC) 40 MG capsule Take 1 capsule (40 mg total) by mouth daily. 90 capsule 3   oxybutynin (DITROPAN-XL) 10 MG 24 hr tablet Take 10 mg by mouth every evening.     rosuvastatin (CRESTOR) 40 MG tablet Take 40 mg by mouth every evening.     acetaminophen (TYLENOL) 650 MG CR tablet Take 650 mg by mouth every 8 (eight) hours as needed for pain.     acyclovir (ZOVIRAX) 400 MG tablet Take 400 mg by mouth daily.     cyanocobalamin (VITAMIN B12) 1000 MCG/ML injection Inject 1,000 mcg into the muscle every 30 (thirty) days.     dicyclomine (BENTYL) 10 MG capsule Take 1 capsule (10 mg total) by mouth every 12 (twelve) hours as needed for spasms. (Patient not taking: Reported on 11/19/2022) 120 capsule 1   nitroGLYCERIN (NITROSTAT) 0.4 MG SL tablet Place 1 tablet (0.4 mg total) under the tongue every 5 (five) minutes as needed for chest pain. 25 tablet 3    Results for orders placed or performed during the hospital encounter of 11/20/22 (from the past 48 hour(s))  Glucose, capillary     Status: Abnormal   Collection Time: 11/20/22  1:07 PM  Result Value Ref Range   Glucose-Capillary 114 (H) 70 - 99 mg/dL    Comment: Glucose reference range applies only to samples taken after fasting for at least 8 hours.   Comment 1 Notify RN    Comment 2 Document in Chart   CBC per protocol     Status: Abnormal   Collection Time: 11/20/22  1:24 PM  Result Value Ref Range   WBC 11.1 (H) 4.0 - 10.5 K/uL   RBC 5.21 (H) 3.87 - 5.11 MIL/uL   Hemoglobin 13.6 12.0 - 15.0 g/dL   HCT 16.1 09.6 - 04.5 %   MCV 84.8 80.0 - 100.0 fL   MCH 26.1 26.0 - 34.0 pg   MCHC 30.8 30.0 - 36.0 g/dL   RDW 40.9 81.1 - 91.4 %   Platelets 229 150 - 400  K/uL   nRBC 0.0 0.0 - 0.2 %    Comment: Performed at [facility]  Lab, [address] [place], [place] [address]   No results found.  Pertinent items noted in HPI and remainder of comprehensive ROS otherwise negative.  Blood pressure (!) 162/78, pulse 77, temperature 97.9 F (36.6 C), temperature source Oral, resp. rate 18, height 5\' 3"  (1.6 m), weight 59.4 kg, SpO2 98 %.  Patient is awake and alert.  She is oriented and appropriate.  Speech is fluent.  Judgment insight are intact.  Cranial nerve function normal bilateral.  Motor examination with decreased strength in her right gastrocnemius muscle grading out of 4-/5.  Remainder of her motor exam intact.  Sensory examination with decrease sensation in bilateral S1 dermatomes.  Deep tendon reflexes normal active septa her Achilles reflexes are absent bilaterally.  No evidence of long track signs.  Gait antalgic.  Straight raising positive bilaterally.  Examination head ears eyes nose and throat is unremarked.  Chest and abdomen are benign.  Extremities are free of major deformity. Assessment/Plan Severe bilateral L5-S1 stenosis with radiculopathy.  Plan bilateral L5-S1 decompressive laminotomies and foraminotomies.  Risks and benefits of been explained.  Patient wishes to proceed.  Sherilyn Cooter A Azyiah Bo 11/20/2022, 1:59 PM

## 2022-11-20 NOTE — Brief Op Note (Signed)
11/20/2022  4:27 PM  PATIENT:  Katrina Castro  77 y.o. female  PRE-OPERATIVE DIAGNOSIS:  Stenosis  POST-OPERATIVE DIAGNOSIS:  Stenosis  PROCEDURE:  Procedure(s) with comments: Lumbar Five-Sacral One Laminectomy and Foraminotomy (Bilateral) - 3C  SURGEON:  Surgeon(s) and Role:    * Julio Sicks, MD - Primary  PHYSICIAN ASSISTANT:   ASSISTANTSMarland Mcalpine   ANESTHESIA:   general  EBL:  20 mL   BLOOD ADMINISTERED:none  DRAINS: none   LOCAL MEDICATIONS USED:  MARCAINE     SPECIMEN:  No Specimen  DISPOSITION OF SPECIMEN:  N/A  COUNTS:  YES  TOURNIQUET:  * No tourniquets in log *  DICTATION: .Dragon Dictation  PLAN OF CARE: Admit for overnight observation  PATIENT DISPOSITION:  PACU - hemodynamically stable.   Delay start of Pharmacological VTE agent (>24hrs) due to surgical blood loss or risk of bleeding: yes

## 2022-11-20 NOTE — Plan of Care (Signed)
  Problem: Education: Goal: Knowledge of General Education information will improve Description: Including pain rating scale, medication(s)/side effects and non-pharmacologic comfort measures Outcome: Progressing   Problem: Health Behavior/Discharge Planning: Goal: Ability to manage health-related needs will improve Outcome: Progressing   Problem: Clinical Measurements: Goal: Ability to maintain clinical measurements within normal limits will improve Outcome: Progressing Goal: Will remain free from infection Outcome: Progressing Goal: Diagnostic test results will improve Outcome: Progressing Goal: Respiratory complications will improve Outcome: Progressing Goal: Cardiovascular complication will be avoided Outcome: Progressing   Problem: Activity: Goal: Risk for activity intolerance will decrease Outcome: Progressing   Problem: Nutrition: Goal: Adequate nutrition will be maintained Outcome: Progressing   Problem: Coping: Goal: Level of anxiety will decrease Outcome: Progressing   Problem: Elimination: Goal: Will not experience complications related to bowel motility Outcome: Progressing Goal: Will not experience complications related to urinary retention Outcome: Progressing   Problem: Pain Managment: Goal: General experience of comfort will improve Outcome: Progressing   Problem: Safety: Goal: Ability to remain free from injury will improve Outcome: Progressing   Problem: Skin Integrity: Goal: Risk for impaired skin integrity will decrease Outcome: Progressing   Problem: Education: Goal: Ability to verbalize activity precautions or restrictions will improve Outcome: Progressing Goal: Knowledge of the prescribed therapeutic regimen will improve Outcome: Progressing Goal: Understanding of discharge needs will improve Outcome: Progressing   Problem: Activity: Goal: Ability to avoid complications of mobility impairment will improve Outcome: Progressing Goal:  Ability to tolerate increased activity will improve Outcome: Progressing Goal: Will remain free from falls Outcome: Progressing   Problem: Bowel/Gastric: Goal: Gastrointestinal status for postoperative course will improve Outcome: Progressing   Problem: Clinical Measurements: Goal: Ability to maintain clinical measurements within normal limits will improve Outcome: Progressing Goal: Postoperative complications will be avoided or minimized Outcome: Progressing Goal: Diagnostic test results will improve Outcome: Progressing   Problem: Pain Management: Goal: Pain level will decrease Outcome: Progressing   Problem: Skin Integrity: Goal: Will show signs of wound healing Outcome: Progressing   Problem: Health Behavior/Discharge Planning: Goal: Identification of resources available to assist in meeting health care needs will improve Outcome: Progressing   Problem: Bladder/Genitourinary: Goal: Urinary functional status for postoperative course will improve Outcome: Progressing   Problem: Education: Goal: Ability to describe self-care measures that may prevent or decrease complications (Diabetes Survival Skills Education) will improve Outcome: Progressing Goal: Individualized Educational Video(s) Outcome: Progressing   Problem: Coping: Goal: Ability to adjust to condition or change in health will improve Outcome: Progressing   Problem: Fluid Volume: Goal: Ability to maintain a balanced intake and output will improve Outcome: Progressing   Problem: Health Behavior/Discharge Planning: Goal: Ability to identify and utilize available resources and services will improve Outcome: Progressing Goal: Ability to manage health-related needs will improve Outcome: Progressing   Problem: Metabolic: Goal: Ability to maintain appropriate glucose levels will improve Outcome: Progressing   Problem: Nutritional: Goal: Maintenance of adequate nutrition will improve Outcome:  Progressing Goal: Progress toward achieving an optimal weight will improve Outcome: Progressing   Problem: Skin Integrity: Goal: Risk for impaired skin integrity will decrease Outcome: Progressing   Problem: Tissue Perfusion: Goal: Adequacy of tissue perfusion will improve Outcome: Progressing   

## 2022-11-21 ENCOUNTER — Encounter (HOSPITAL_COMMUNITY): Payer: Self-pay | Admitting: Neurosurgery

## 2022-11-21 DIAGNOSIS — E119 Type 2 diabetes mellitus without complications: Secondary | ICD-10-CM | POA: Diagnosis not present

## 2022-11-21 DIAGNOSIS — I10 Essential (primary) hypertension: Secondary | ICD-10-CM | POA: Diagnosis not present

## 2022-11-21 DIAGNOSIS — I2510 Atherosclerotic heart disease of native coronary artery without angina pectoris: Secondary | ICD-10-CM | POA: Diagnosis not present

## 2022-11-21 DIAGNOSIS — M4807 Spinal stenosis, lumbosacral region: Secondary | ICD-10-CM | POA: Diagnosis not present

## 2022-11-21 DIAGNOSIS — Z951 Presence of aortocoronary bypass graft: Secondary | ICD-10-CM | POA: Diagnosis not present

## 2022-11-21 DIAGNOSIS — M48062 Spinal stenosis, lumbar region with neurogenic claudication: Secondary | ICD-10-CM | POA: Diagnosis not present

## 2022-11-21 DIAGNOSIS — M5417 Radiculopathy, lumbosacral region: Secondary | ICD-10-CM | POA: Diagnosis not present

## 2022-11-21 DIAGNOSIS — Z79899 Other long term (current) drug therapy: Secondary | ICD-10-CM | POA: Diagnosis not present

## 2022-11-21 DIAGNOSIS — Z7982 Long term (current) use of aspirin: Secondary | ICD-10-CM | POA: Diagnosis not present

## 2022-11-21 DIAGNOSIS — Z96651 Presence of right artificial knee joint: Secondary | ICD-10-CM | POA: Diagnosis not present

## 2022-11-21 DIAGNOSIS — Z7984 Long term (current) use of oral hypoglycemic drugs: Secondary | ICD-10-CM | POA: Diagnosis not present

## 2022-11-21 DIAGNOSIS — I4891 Unspecified atrial fibrillation: Secondary | ICD-10-CM | POA: Diagnosis not present

## 2022-11-21 LAB — GLUCOSE, CAPILLARY: Glucose-Capillary: 127 mg/dL — ABNORMAL HIGH (ref 70–99)

## 2022-11-21 MED ORDER — HYDROCODONE-ACETAMINOPHEN 5-325 MG PO TABS: 1.0000 | ORAL_TABLET | ORAL | 0 refills | Status: DC | PRN

## 2022-11-21 MED ORDER — CYCLOBENZAPRINE HCL 10 MG PO TABS: 10.0000 mg | ORAL_TABLET | Freq: Three times a day (TID) | ORAL | 0 refills | Status: DC | PRN

## 2022-11-21 MED FILL — Thrombin For Soln 5000 Unit: CUTANEOUS | Qty: 2 | Status: AC

## 2022-11-21 NOTE — Plan of Care (Signed)
Pt doing well. Pt given D/C instructions with verbal understanding. Rx's were sent to the pharmacy by MD. Pt's incision is clean and dry with no sign of infection. Pt's IV was removed prior to D/C. Pt D/C'd home via wheelchair per MD order. Pt received 3-n-1 from [facility] per MD order. Pt is stable @ D/C and has no other needs at this time. Ephram Kornegay, RN  

## 2022-11-21 NOTE — Discharge Instructions (Signed)

## 2022-11-21 NOTE — Evaluation (Signed)
Occupational Therapy Evaluation Patient Details Name: Katrina Castro MRN: [id number] DOB: 10-Oct-1945 Today's Date: 11/21/2022   History of Present Illness Pt is a 77 y.o. female s/p L5-S1 bilateral laminectomies and foraminotomies. PMH significant for CAD, depression, DMII, dysphagia, GERD, dysrhythmia, HTN.   Clinical Impression   PTA, pt lived alone and was independent. Upon eval, pt performing UB ADL with set-up and LB ADL with supervision. Pt educated and demonstrating use of compensatory techniques for bed mobility, LB ADL, grooming, toileting, and shower transfers within precautions. Pt with dizziness related to soft BP with new pain medication and reluctance to sit when dizzy. Max education provided. Unable to log into MyChart; assisted with resetting password and basics of navigating mychart from her phone to optimize her health maintenance. Will follow to optimize safety with mobility, but do not suspect need for follow up after discharge.     Recommendations for follow up therapy are one component of a multi-disciplinary discharge planning process, led by the attending physician.  Recommendations may be updated based on patient status, additional functional criteria and insurance authorization.   Assistance Recommended at Discharge Intermittent Supervision/Assistance  Patient can return home with the following Help with stairs or ramp for entrance;Assist for transportation;Assistance with cooking/housework;A little help with bathing/dressing/bathroom;A little help with walking and/or transfers    Functional Status Assessment  Patient has had a recent decline in their functional status and demonstrates the ability to make significant improvements in function in a reasonable and predictable amount of time.  Equipment Recommendations  BSC/3in1    Recommendations for Other Services       Precautions / Restrictions Precautions Precautions: Back Precaution Booklet Issued: Yes  (comment) Precaution Comments: All precautions reviewed during ADL Restrictions Weight Bearing Restrictions: No      Mobility Bed Mobility Overal bed mobility: Needs Assistance Bed Mobility: Rolling, Sidelying to Sit Rolling: Supervision Sidelying to sit: Supervision       General bed mobility comments: cues for technique    Transfers Overall transfer level: Needs assistance Equipment used: None Transfers: Sit to/from Stand Sit to Stand: Supervision           General transfer comment: for safety      Balance Overall balance assessment: Mild deficits observed, not formally tested (likely decr due to soft BP)                                         ADL either performed or assessed with clinical judgement   ADL Overall ADL's : Needs assistance/impaired Eating/Feeding: Independent   Grooming: Supervision/safety;Standing   Upper Body Bathing: Set up;Sitting   Lower Body Bathing: Supervison/ safety;Sit to/from stand   Upper Body Dressing : Set up;Sitting   Lower Body Dressing: Supervision/safety;Sit to/from stand   Toilet Transfer: Min guard;Ambulation Toilet Transfer Details (indicate cue type and reason): min guard due to pt dizzy throughout session with soft BPs. Able to self correct mild imbalance. Toileting- Clothing Manipulation and Hygiene: Supervision/safety;Sit to/from stand   Tub/ Shower Transfer: Min guard;Ambulation   Functional mobility during ADLs: Min guard General ADL Comments: Pt reporting unable to access her MyChart; of note reading glasses not present with her. Assisted with signing into and navigating her MyChart to optimize her health management     Vision Baseline Vision/History: 0 No visual deficits (reading glasses) Ability to See in Adequate Light: 0 Adequate Patient Visual Report: No change  from baseline Vision Assessment?: No apparent visual deficits     Perception Perception Perception Tested?: No   Praxis  Praxis Praxis tested?: Not tested    Pertinent Vitals/Pain Pain Assessment Pain Assessment: Faces Faces Pain Scale: Hurts little more Pain Location: operative site Pain Descriptors / Indicators: Aching, Operative site guarding, Sore Pain Intervention(s): Limited activity within patient's tolerance, Monitored during session     Hand Dominance     Extremity/Trunk Assessment Upper Extremity Assessment Upper Extremity Assessment: Overall WFL for tasks assessed   Lower Extremity Assessment Lower Extremity Assessment: Generalized weakness   Cervical / Trunk Assessment Cervical / Trunk Assessment: Back Surgery   Communication Communication Communication: No difficulties   Cognition Arousal/Alertness: Awake/alert Behavior During Therapy: WFL for tasks assessed/performed Overall Cognitive Status: Within Functional Limits for tasks assessed                                 General Comments: Pt with dizziness and soft BPs at eval; poor safety awareness with onset of dizziness stating "I dont know why I feel like this" multiple times and requriing max cues to sit when dizzy. Max education provided and pt verbalizing understanding with teachback, but when experiencing dizziness, rather than sitting stating "I'm just hard headed. RN notified to provide continued education.     General Comments  MAP of 60 supine and 68 seated. RN and NT aware    Exercises     Shoulder Instructions      Home Living Family/patient expects to be discharged to:: Private residence Living Arrangements: Alone Available Help at Discharge: Family;Available PRN/intermittently Type of Home: Mobile home Home Access: Stairs to enter Entrance Stairs-Number of Steps: 4 Entrance Stairs-Rails: Right;Left Home Layout: One level     Bathroom Shower/Tub: Chief Strategy Officer: Standard     Home Equipment: None          Prior Functioning/Environment Prior Level of Function :  Independent/Modified Independent             Mobility Comments: no AD ADLs Comments: indep in ADL and IADL        OT Problem List: Decreased strength;Decreased activity tolerance;Impaired balance (sitting and/or standing);Decreased knowledge of precautions;Decreased knowledge of use of DME or AE;Decreased safety awareness      OT Treatment/Interventions: Self-care/ADL training;Therapeutic exercise;DME and/or AE instruction;Balance training;Patient/family education;Therapeutic activities    OT Goals(Current goals can be found in the care plan section) Acute Rehab OT Goals Patient Stated Goal: go home OT Goal Formulation: With patient Time For Goal Achievement: 12/05/22 Potential to Achieve Goals: Good  OT Frequency: Min 2X/week    Co-evaluation              AM-PAC OT "6 Clicks" Daily Activity     Outcome Measure Help from another person eating meals?: None Help from another person taking care of personal grooming?: A Little Help from another person toileting, which includes using toliet, bedpan, or urinal?: A Little Help from another person bathing (including washing, rinsing, drying)?: A Little Help from another person to put on and taking off regular upper body clothing?: A Little Help from another person to put on and taking off regular lower body clothing?: A Little 6 Click Score: 19   End of Session Equipment Utilized During Treatment: Gait belt Nurse Communication: Mobility status;Other (comment) (soft BP ad requiring max education for sitig when dizzy)  Activity Tolerance: Patient tolerated treatment well Patient  left: in bed;with call bell/phone within reach  OT Visit Diagnosis: Unsteadiness on feet (R26.81);Muscle weakness (generalized) (M62.81);Dizziness and giddiness (R42)                Time: 1610-9604 OT Time Calculation (min): 28 min Charges:  OT General Charges $OT Visit: 1 Visit OT Evaluation $OT Eval Low Complexity: 1 Low OT Treatments $Self  Care/Home Management : 8-22 mins  Tyler Deis, OTR/L [facility] Acute Rehabilitation Office: [phone]   Katrina Castro 11/21/2022, 9:09 AM

## 2022-11-21 NOTE — Discharge Summary (Signed)
Physician Discharge Summary  Patient ID: Dametria Gressman MRN: [id number] DOB/AGE: 1945-07-28 77 y.o.  Admit date: 11/20/2022 Discharge date: 11/21/2022  Admission Diagnoses:  Discharge Diagnoses:  Principal Problem:   Lumbar stenosis with neurogenic claudication   Discharged Condition: good  Hospital Course: Patient admitted to the hospital where she underwent complicated decompressive surgery.  Postoperative doing very well.  Preoperative severe radicular pain numbness and weakness much improved.  Standing ambulating and voiding without difficulty.  Patient ready for discharge home.  Consults:   Significant Diagnostic Studies:   Treatments:   Discharge Exam: Blood pressure (!) 97/53, pulse 70, temperature 98.4 F (36.9 C), temperature source Oral, resp. rate 16, height 5\' 3"  (1.6 m), weight 59.4 kg, SpO2 97 %. Awake and alert.  Oriented and appropriate.  Motor and sensory function intact.  Wound clean and dry.  Chest and abdomen benign.  Disposition: Discharge disposition: 01-Home or Self Care        Allergies as of 11/21/2022       Reactions   Bee Venom Anaphylaxis   Levofloxacin Anaphylaxis, Rash   Cardizem [diltiazem] Other (See Comments)   Patient states that she could not think, felt that she was in the twilight zone. Arm discomfort.   Sulfa Antibiotics Itching   Diazepam Nausea Only   Lipitor [atorvastatin] Other (See Comments)   Caused her body to be "out of whack"  - elevated sugar also.        Medication List     TAKE these medications    acetaminophen 650 MG CR tablet Commonly known as: TYLENOL Take 650 mg by mouth every 8 (eight) hours as needed for pain.   acyclovir 400 MG tablet Commonly known as: ZOVIRAX Take 400 mg by mouth daily.   ALPRAZolam 0.5 MG tablet Commonly known as: XANAX Take 0.25 mg by mouth at bedtime as needed for sleep.   aspirin EC 81 MG tablet Take 1 tablet (81 mg total) by mouth daily. Swallow whole.   B-12  PO Take 1 tablet by mouth daily. Gummies   CALCIUM GUMMIES PO Take 1 tablet by mouth daily. Gummy   cyanocobalamin 1000 MCG/ML injection Commonly known as: VITAMIN B12 Inject 1,000 mcg into the muscle every 30 (thirty) days.   cyclobenzaprine 10 MG tablet Commonly known as: FLEXERIL Take 1 tablet (10 mg total) by mouth 3 (three) times daily as needed for muscle spasms.   dicyclomine 10 MG capsule Commonly known as: BENTYL Take 1 capsule (10 mg total) by mouth every 12 (twelve) hours as needed for spasms.   ELDERBERRY PO Take 1 tablet by mouth daily. Gummy   ezetimibe 10 MG tablet Commonly known as: ZETIA TAKE 1 TABLET ONCE DAILY.   ferrous sulfate 325 (65 FE) MG EC tablet Take 325 mg by mouth 2 (two) times a week.   glipiZIDE 2.5 MG 24 hr tablet Commonly known as: GLUCOTROL XL Take 1 tablet (2.5 mg total) by mouth daily with breakfast.   HAIR SKIN & NAILS GUMMIES PO Take 2 capsules by mouth daily.   HYDROcodone-acetaminophen 5-325 MG tablet Commonly known as: NORCO/VICODIN Take 1 tablet by mouth every 4 (four) hours as needed for moderate pain ((score 4 to 6)).   Jardiance 25 MG Tabs tablet Generic drug: empagliflozin TAKE 1 TABLET BY MOUTH DAILY BEFORE BREAKFAST.   Lantus SoloStar 100 UNIT/ML Solostar Pen Generic drug: insulin glargine Inject 10 Units into the skin daily as needed (high blood glucose).   levocetirizine 5 MG tablet Commonly known  asElita Boone Take 5 mg by mouth every evening.   metFORMIN 1000 MG tablet Commonly known as: GLUCOPHAGE Take 1,000 mg by mouth 2 (two) times daily.   metoprolol tartrate 25 MG tablet Commonly known as: LOPRESSOR TAKE (1/2) TABLET BY MOUTH 2 TIMES A DAY.   MUSCLE RUB EX Apply 1 application topically daily as needed (muscle pain). [place] gel   nitroGLYCERIN 0.4 MG SL tablet Commonly known as: NITROSTAT Place 1 tablet (0.4 mg total) under the tongue every 5 (five) minutes as needed for chest pain.   omeprazole 40 MG  capsule Commonly known as: PRILOSEC Take 1 capsule (40 mg total) by mouth daily.   oxybutynin 10 MG 24 hr tablet Commonly known as: DITROPAN-XL Take 10 mg by mouth every evening.   rosuvastatin 40 MG tablet Commonly known as: CRESTOR Take 40 mg by mouth every evening.   Vitamin D 50 MCG (2000 UT) tablet Take 2,000 Units by mouth daily.         Signed: Kathaleen Maser Ayush Boulet 11/21/2022, 8:20 AM

## 2022-11-22 ENCOUNTER — Ambulatory Visit (HOSPITAL_COMMUNITY): Payer: Medicare Other

## 2022-11-22 ENCOUNTER — Encounter (HOSPITAL_COMMUNITY): Payer: Self-pay

## 2022-11-26 DIAGNOSIS — Z09 Encounter for follow-up examination after completed treatment for conditions other than malignant neoplasm: Secondary | ICD-10-CM | POA: Diagnosis not present

## 2022-11-26 DIAGNOSIS — I4891 Unspecified atrial fibrillation: Secondary | ICD-10-CM | POA: Diagnosis not present

## 2022-11-26 DIAGNOSIS — R5383 Other fatigue: Secondary | ICD-10-CM | POA: Diagnosis not present

## 2022-11-26 DIAGNOSIS — Z9889 Other specified postprocedural states: Secondary | ICD-10-CM | POA: Diagnosis not present

## 2022-11-28 ENCOUNTER — Other Ambulatory Visit: Payer: Self-pay | Admitting: Internal Medicine

## 2022-12-04 ENCOUNTER — Ambulatory Visit: Payer: Medicare Other | Admitting: Internal Medicine

## 2022-12-13 DIAGNOSIS — I4891 Unspecified atrial fibrillation: Secondary | ICD-10-CM | POA: Diagnosis not present

## 2022-12-13 DIAGNOSIS — D6869 Other thrombophilia: Secondary | ICD-10-CM | POA: Diagnosis not present

## 2022-12-13 DIAGNOSIS — I25119 Atherosclerotic heart disease of native coronary artery with unspecified angina pectoris: Secondary | ICD-10-CM | POA: Diagnosis not present

## 2022-12-27 ENCOUNTER — Encounter: Payer: Self-pay | Admitting: Internal Medicine

## 2022-12-27 ENCOUNTER — Ambulatory Visit (INDEPENDENT_AMBULATORY_CARE_PROVIDER_SITE_OTHER): Payer: Medicare Other | Admitting: Internal Medicine

## 2022-12-27 VITALS — BP 118/64 | HR 109 | Ht 63.0 in | Wt 127.4 lb

## 2022-12-27 DIAGNOSIS — Z7984 Long term (current) use of oral hypoglycemic drugs: Secondary | ICD-10-CM

## 2022-12-27 DIAGNOSIS — E1159 Type 2 diabetes mellitus with other circulatory complications: Secondary | ICD-10-CM

## 2022-12-27 DIAGNOSIS — E119 Type 2 diabetes mellitus without complications: Secondary | ICD-10-CM

## 2022-12-27 DIAGNOSIS — E1165 Type 2 diabetes mellitus with hyperglycemia: Secondary | ICD-10-CM

## 2022-12-27 DIAGNOSIS — E785 Hyperlipidemia, unspecified: Secondary | ICD-10-CM | POA: Diagnosis not present

## 2022-12-27 LAB — HEMOGLOBIN A1C: Hemoglobin A1C: 6.7

## 2022-12-27 NOTE — Progress Notes (Signed)
Patient ID: Katrina Castro, female   DOB: 05-27-1946, 77 y.o.   MRN: [id number]  HPI: Katrina Castro is a 77 y.o.-year-old female, returning for follow-up for DM2, dx in 2015, noninsulin-dependent, uncontrolled, with complications (CAD, s/p CABG; PN). Pt. previously saw Dr. Everardo All.  Last visit with me 5 months ago.  Interim history: No blurry vision, no nausea, chest pain.  She is seen by GI for dysphagia and GERD. She had lumbar laminectomy and foraminectomy surgery 1 mo ago. She is doing very well after the surgery. Her R leg numbness has resolved. She was able to start on a CGM since last visit.  Reviewed HbA1c: Lab Results  Component Value Date   HGBA1C 7.6 (A) 08/02/2022   HGBA1C 7.8 (H) 03/23/2022   HGBA1C 7.0 (A) 11/17/2021   HGBA1C 7.3 (A) 09/20/2021   HGBA1C 8.0 (A) 07/26/2021   HGBA1C 6.4 (H) 05/12/2020   HGBA1C 7.5 (H) 04/25/2020   HGBA1C 7.5 (H) 04/25/2020   Pt is on a regimen of: - Metformin ER 500 mg 2x daily >> Metformin 1000 mg in am - Glipizide ER 2.5 mg before breakfast (she was apparently taking 3 tablets a day 2 in am and 1 in pm is 08/2022 at the advice of another provider) >> now once a day - Jardiance 25 mg daily - Lantus 6 units daily in am -she continues to take this very inconsistently, and only when sugars are higher than 190s. She tried Rybelsus >> nausea, but retrospectively, this was 2/2 Esophageal pbs. She did not qualify for the patient assistance program for [place] before.  Pt checks her sugars >4xa day:  Prev.: - am: 62, 108-114, 160 (watermelon) >> 120 (insulin), 130-200 >> 110-140 - 2h after b'fast: n/c - before lunch: n/c - 2h after lunch: n/c - before dinner: n/c - 2h after dinner: n/c - bedtime: n/c >> 200, 300 -cannot remember other values as she is not usually checking at this time unless she feels poorly. - nighttime: n/c Lowest sugar was 62 (took Glipizide at bedtime as she forgot it in am) >> 120 >> 110 >> 50-60s after  surgery -and few days ago, as she is not eating well; she has hypoglycemia awareness at 70.  Highest sugar was 160 >> 240 >>  300 (URI-antibiotics) >> 200s.  Glucometer:Prodigy  - no CKD, last BUN/creatinine:  Lab Results  Component Value Date   BUN 18 11/20/2022   BUN 12 03/23/2022   CREATININE 0.73 11/20/2022   CREATININE 0.76 03/23/2022  She is not on ACE inhibitor or ARB.  -+ HL; last set of lipids: Lab Results  Component Value Date   CHOL 112 03/23/2022   HDL 63 09/24/2022   LDLCALC 36 03/23/2022   TRIG 100 09/24/2022   CHOLHDL 2.3 03/23/2022  On Crestor 40, Zetia 10.  - last eye exam was in 09/2021. No DR - reportedly.   - no numbness and tingling in her feet.  She has a history of left foot surgery.  Last foot exam 07/11/2022 by Dr. Lilian Kapur with podiatry.  She also has a history of atrial fibrillation, HTN, GERD, depression. She is on B12 supplements.  She goes to the gym - treadmill.  She was abandoned by her mother in a hotel when she was 70 weeks old. She is working part time - caregiver for cancer patient.  She used to drive a truck for many years.  ROS: + see HPI  Past Medical History:  Diagnosis Date  Arthritis    CAD (coronary artery disease)    a. s/p CABG on 04/27/2020 with LIMA-LAD, seq SVG-PDA-PL, seq left radial-RI-OM   Concussion    2015   Depression    Diabetes (HCC)    type 2   Dysphagia    Dysrhythmia    afib   GERD (gastroesophageal reflux disease)    Headache    History of bronchitis    Hypertension    Seasonal allergies    Toenail fungus    Past Surgical History:  Procedure Laterality Date   APPENDECTOMY     BACK SURGERY     x2   BOTOX INJECTION N/A 06/06/2016   Procedure: BOTOX INJECTION;  Surgeon: Malissa Hippo, MD;  [facility];  Service: Endoscopy;  Laterality: N/A;   BOTOX INJECTION N/A 01/17/2021   Procedure: BOTOX INJECTION;  Surgeon: Dolores Frame, MD;  [facility];  Service:  Gastroenterology;  Laterality: N/A;   BOTOX INJECTION N/A 07/11/2021   Procedure: BOTOX INJECTION;  Surgeon: Dolores Frame, MD;  [facility];  Service: Gastroenterology;  Laterality: N/A;   BOTOX INJECTION N/A 08/21/2022   Procedure: BOTOX INJECTION;  Surgeon: Dolores Frame, MD;  [facility];  Service: Gastroenterology;  Laterality: N/A;   CHOLECYSTECTOMY     COLONOSCOPY N/A 11/05/2012   Procedure: COLONOSCOPY;  Surgeon: Malissa Hippo, MD;  [facility];  Service: Endoscopy;  Laterality: N/A;  1030   COLONOSCOPY WITH PROPOFOL N/A 05/01/2019   Procedure: COLONOSCOPY WITH PROPOFOL;  Surgeon: Malissa Hippo, MD;  [facility];  Service: Endoscopy;  Laterality: N/A;  11:20am   CORONARY ARTERY BYPASS GRAFT N/A 04/27/2020   Procedure: CORONARY ARTERY BYPASS GRAFTING (CABG) TIMES FIVE USING LEFT INTERNAL MAMMARY ARTERY, LEFT HARVESTED RADIAL ARTERY, RIGHT GREATER SAPHENOUS VEIN HARVESTED ENDOSCOPICALLY.;  Surgeon: Linden Dolin, MD;  Location: [facility];  Service: Open Heart Surgery;  Laterality: N/A;   ESOPHAGEAL DILATION N/A 07/06/2015   Procedure: ESOPHAGEAL DILATION;  Surgeon: Malissa Hippo, MD;  [facility];  Service: Endoscopy;  Laterality: N/A;   ESOPHAGEAL DILATION N/A 06/06/2016   Procedure: ESOPHAGEAL DILATION;  Surgeon: Malissa Hippo, MD;  [facility];  Service: Endoscopy;  Laterality: N/A;   ESOPHAGEAL DILATION N/A 08/21/2022   Procedure: ESOPHAGEAL DILATION;  Surgeon: Dolores Frame, MD;  [facility];  Service: Gastroenterology;  Laterality: N/A;   ESOPHAGOGASTRODUODENOSCOPY N/A 02/12/2014   Procedure: ESOPHAGOGASTRODUODENOSCOPY (EGD);  Surgeon: Malissa Hippo, MD;  [facility];  Service: Endoscopy;  Laterality: N/A;  150   ESOPHAGOGASTRODUODENOSCOPY N/A 07/06/2015   Procedure: ESOPHAGOGASTRODUODENOSCOPY (EGD);  Surgeon: Malissa Hippo, MD;  [facility];  Service: Endoscopy;  Laterality: N/A;  1:25 - moved to 1/18 @ 10:30 - Ann to notify pt   ESOPHAGOGASTRODUODENOSCOPY (EGD) WITH ESOPHAGEAL DILATION N/A 07/25/2012   Procedure: ESOPHAGOGASTRODUODENOSCOPY (EGD) WITH ESOPHAGEAL DILATION;  Surgeon: Malissa Hippo, MD;  [facility];  Service: Endoscopy;  Laterality: N/A;  325-rescheduled to 855 Ann notified pt   ESOPHAGOGASTRODUODENOSCOPY (EGD) WITH PROPOFOL N/A 06/06/2016   Procedure: ESOPHAGOGASTRODUODENOSCOPY (EGD) WITH PROPOFOL;  Surgeon: Malissa Hippo, MD;  [facility];  Service: Endoscopy;  Laterality: N/A;   ESOPHAGOGASTRODUODENOSCOPY (EGD) WITH PROPOFOL N/A 01/17/2021   Procedure: ESOPHAGOGASTRODUODENOSCOPY (EGD) WITH PROPOFOL;  Surgeon: Dolores Frame, MD;  [facility];  Service: Gastroenterology;  Laterality: N/A;  10:35  ESOPHAGOGASTRODUODENOSCOPY (EGD) WITH PROPOFOL N/A 07/11/2021   Procedure: ESOPHAGOGASTRODUODENOSCOPY (EGD) WITH PROPOFOL;  Surgeon: Dolores Frame, MD;  [facility];  Service: Gastroenterology;  Laterality: N/A;  11:15   ESOPHAGOGASTRODUODENOSCOPY (EGD) WITH PROPOFOL N/A 08/21/2022   Procedure: ESOPHAGOGASTRODUODENOSCOPY (EGD) WITH PROPOFOL;  Surgeon: Dolores Frame, MD;  [facility];  Service: Gastroenterology;  Laterality: N/A;  1115AM, ASA 2   EYE SURGERY     cataracts removed   Foot surgeries Bilateral    hammer toes   LEFT HEART CATH AND CORONARY ANGIOGRAPHY N/A 04/26/2020   Procedure: LEFT HEART CATH AND CORONARY ANGIOGRAPHY;  Surgeon: [place]az[place], Peter M, MD;  Location: [facility];  Service: Cardiovascular;  Laterality: N/A;   LUMBAR LAMINECTOMY/DECOMPRESSION MICRODISCECTOMY Bilateral 11/20/2022   Procedure: Lumbar Five-Sacral One Laminectomy and Foraminotomy;  Surgeon: Julio Sicks, MD;  Location: [facility];  Service: Neurosurgery;  Laterality: Bilateral;  3C   MALONEY DILATION N/A 02/12/2014   Procedure: MALONEY DILATION;   Surgeon: Malissa Hippo, MD;  [facility];  Service: Endoscopy;  Laterality: N/A;   POLYPECTOMY  05/01/2019   Procedure: POLYPECTOMY;  Surgeon: Malissa Hippo, MD;  [facility];  Service: Endoscopy;;  colon    RADIAL ARTERY HARVEST Left 04/27/2020   Procedure: LEFT RADIAL ARTERY HARVEST;  Surgeon: Linden Dolin, MD;  Location: [facility];  Service: Open Heart Surgery;  Laterality: Left;   Right knee arthroscopy     x2   TEE WITHOUT CARDIOVERSION N/A 04/27/2020   Procedure: TRANSESOPHAGEAL ECHOCARDIOGRAM (TEE);  Surgeon: Linden Dolin, MD;  Location: [facility] OR;  Service: Open Heart Surgery;  Laterality: N/A;   TONSILLECTOMY     TOTAL ABDOMINAL HYSTERECTOMY     TOTAL KNEE ARTHROPLASTY Right 04/03/2017   Procedure: RIGHT TOTAL KNEE ARTHROPLASTY;  Surgeon: Ranee Gosselin, MD;  Location: [facility];  Service: Orthopedics;  Laterality: Right;   Social History   Socioeconomic History   Marital status: Divorced    Spouse name: Not on file   Number of children: Not on file   Years of education: Not on file   Highest education level: Not on file  Occupational History   Not on file  Tobacco Use   Smoking status: Never   Smokeless tobacco: Never  Vaping Use   Vaping status: Never Used  Substance and Sexual Activity   Alcohol use: No    Alcohol/week: 0.0 standard drinks of alcohol    Comment: socially    Drug use: No   Sexual activity: Not Currently    Partners: Male    Birth control/protection: None  Other Topics Concern   Not on file  Social History Narrative   Not on file   Social Determinants of Health   Financial Resource Strain: Not on file  Food Insecurity: Not on file  Transportation Needs: Not on file  Physical Activity: Not on file  Stress: Not on file  Social Connections: Not on file  Intimate Partner Violence: Not on file   Current Outpatient Medications on File Prior to Visit  Medication Sig Dispense Refill   acetaminophen (TYLENOL) 650 MG  CR tablet Take 650 mg by mouth every 8 (eight) hours as needed for pain.     acyclovir (ZOVIRAX) 400 MG tablet Take 400 mg by mouth daily.     ALPRAZolam (XANAX) 0.5 MG tablet Take 0.25 mg by mouth at bedtime as needed for sleep.     aspirin 81 MG EC tablet Take 1  tablet (81 mg total) by mouth daily. Swallow whole. 30 tablet 11   Biotin w/ Vitamins C & E (HAIR SKIN & NAILS GUMMIES PO) Take 2 capsules by mouth daily.     Calcium-Phosphorus-Vitamin D (CALCIUM GUMMIES PO) Take 1 tablet by mouth daily. Gummy     Cholecalciferol (VITAMIN D) 50 MCG (2000 UT) tablet Take 2,000 Units by mouth daily.     Cyanocobalamin (B-12 PO) Take 1 tablet by mouth daily. Gummies     cyanocobalamin (VITAMIN B12) 1000 MCG/ML injection Inject 1,000 mcg into the muscle every 30 (thirty) days.     cyclobenzaprine (FLEXERIL) 10 MG tablet Take 1 tablet (10 mg total) by mouth 3 (three) times daily as needed for muscle spasms. 30 tablet 0   dicyclomine (BENTYL) 10 MG capsule Take 1 capsule (10 mg total) by mouth every 12 (twelve) hours as needed for spasms. (Patient not taking: Reported on 11/19/2022) 120 capsule 1   ELDERBERRY PO Take 1 tablet by mouth daily. Gummy     empagliflozin (JARDIANCE) 25 MG TABS tablet TAKE 1 TABLET BY MOUTH DAILY BEFORE BREAKFAST. 90 tablet 3   ezetimibe (ZETIA) 10 MG tablet TAKE 1 TABLET ONCE DAILY. 30 tablet 9   ferrous sulfate 325 (65 FE) MG EC tablet Take 325 mg by mouth 2 (two) times a week.     glipiZIDE (GLUCOTROL XL) 2.5 MG 24 hr tablet Take 1 tablet (2.5 mg total) by mouth daily with breakfast. 90 tablet 3   HYDROcodone-acetaminophen (NORCO/VICODIN) 5-325 MG tablet Take 1 tablet by mouth every 4 (four) hours as needed for moderate pain ((score 4 to 6)). 30 tablet 0   LANTUS SOLOSTAR 100 UNIT/ML Solostar Pen Inject 10 Units into the skin daily as needed (high blood glucose).     levocetirizine (XYZAL) 5 MG tablet Take 5 mg by mouth every evening.     Menthol-Methyl Salicylate (MUSCLE RUB EX)  Apply 1 application topically daily as needed (muscle pain). [place] gel     metFORMIN (GLUCOPHAGE) 1000 MG tablet Take 1,000 mg by mouth 2 (two) times daily.     metoprolol tartrate (LOPRESSOR) 25 MG tablet TAKE (1/2) TABLET BY MOUTH 2 TIMES A DAY. 90 tablet 3   nitroGLYCERIN (NITROSTAT) 0.4 MG SL tablet Place 1 tablet (0.4 mg total) under the tongue every 5 (five) minutes as needed for chest pain. 25 tablet 3   omeprazole (PRILOSEC) 40 MG capsule Take 1 capsule (40 mg total) by mouth daily. 90 capsule 3   oxybutynin (DITROPAN-XL) 10 MG 24 hr tablet Take 10 mg by mouth every evening.     rosuvastatin (CRESTOR) 40 MG tablet Take 40 mg by mouth every evening.     No current facility-administered medications on file prior to visit.   Allergies  Allergen Reactions   Bee Venom Anaphylaxis   Levofloxacin Anaphylaxis and Rash   Cardizem [Diltiazem] Other (See Comments)    Patient states that she could not think, felt that she was in the twilight zone. Arm discomfort.   Sulfa Antibiotics Itching   Diazepam Nausea Only   Lipitor [Atorvastatin] Other (See Comments)    Caused her body to be "out of whack"  - elevated sugar also.   Family History  Problem Relation Age of Onset   Diabetes Mother    Stroke Father    Diabetes Sister    Colon cancer Neg Hx    Breast cancer Neg Hx    PE: BP 118/64   Pulse (!) 109  Ht 5\' 3"  (1.6 m)   Wt 127 lb 6.4 oz (57.8 kg)   SpO2 97%   BMI 22.57 kg/m  Wt Readings from Last 3 Encounters:  12/27/22 127 lb 6.4 oz (57.8 kg)  11/20/22 131 lb (59.4 kg)  11/15/22 132 lb 3.2 oz (60 kg)   Constitutional: normal weight, in NAD Eyes: EOMI, no exophthalmos ENT: no thyromegaly, no cervical lymphadenopathy Cardiovascular: RRR (no tachycardia at the time of the exam), No MRG Respiratory: CTA B Musculoskeletal: no deformities Skin: moist, warm, no rashes Neurological: + tremor with outstretched hands  ASSESSMENT: 1. DM2, insulin-dependent, uncontrolled, with  complications - CAD, s/p CABG 2021 - cardiologist: Dr. Tenny Craw - PN  2. HL  PLAN:  1. Patient with longstanding, uncontrolled, type 2 diabetes, on oral antidiabetic regimen with metformin, sulfonylurea and SGLT2 inhibitor along with long-acting insulin.  At last visit, she was only taking Lantus as needed when the sugars are high at bedtime but we discussed that this is not a prn medication.  I advised her to take 60 units in the morning.  She was interested in a CGM and a referral to nutrition.  I placed the referral twice, but she still did not see nutrition. -At last visit, sugars were improved in the morning but she was not checking later in the day.  I advised her to try to do so.  HbA1c was slightly lower, at 7.6%. CGM interpretation: -At today's visit, we reviewed her CGM downloads: It appears that 91% of values are in target range (goal >70%), while 7% are higher than 180 (goal <25%), and 2% are lower than 70 (goal <4%).  The calculated average blood sugar is 125.  The projected HbA1c for the next 3 months (GMI) is 6.3%. -Reviewing the CGM trends, sugars appear to be fluctuating mostly within the target range with occasional hyperglycemic spikes after breakfast mostly.  Around that time and overnight, sugars fluctuate in the lower half of the target interval, and even occasionally down to the 60s or upper 50s especially as she is not eating very well after her back surgery, due to decrease in appetite.  She did not have lows in the last few days, however.  As she tells me that she is still taking Lantus inconsistently, only when sugars are high, due to the occasional hypoglycemic episodes and the fact that she is not eating well, I advised her to stop Lantus completely for now.  I also advised her to make sure that she is only taking 1 glipizide tablet before breakfast.  Otherwise we will continue the same regimen. - I suggested to:  Patient Instructions  Please use the following regimen: -  Metformin 1000 mg daily - Glipizide ER 2.5 mg before breakfast - Jardiance 25 mg before breakfast  Stop: - Lantus  Please return in 4 months.   - we checked her HbA1c: 6.7% (improved) - advised to check sugars at different times of the day - 4x a day, rotating check times - advised for yearly eye exams >> she is due - return to clinic in 4 months  2. HL -Reviewed latest lipid panel from 10/06/2022 and 04/06/2022: At goal: Lab Results  Component Value Date   CHOL 112 03/23/2022   HDL 63 09/24/2022   LDLCALC 36 03/23/2022   TRIG 100 09/24/2022   CHOLHDL 2.3 03/23/2022  -She is on Crestor 40 mg daily and Zetia 10 mg daily without side effects  Carlus Pavlov, MD PhD [facility] Endocrinology

## 2022-12-27 NOTE — Patient Instructions (Addendum)
Please use the following regimen: - Metformin 1000 mg daily - Glipizide ER 2.5 mg before breakfast - Jardiance 25 mg before breakfast  Stop: - Lantus  Please return in 4 months.

## 2023-01-03 DIAGNOSIS — M545 Low back pain, unspecified: Secondary | ICD-10-CM | POA: Diagnosis not present

## 2023-01-03 DIAGNOSIS — R350 Frequency of micturition: Secondary | ICD-10-CM | POA: Diagnosis not present

## 2023-01-03 DIAGNOSIS — Z299 Encounter for prophylactic measures, unspecified: Secondary | ICD-10-CM | POA: Diagnosis not present

## 2023-01-03 DIAGNOSIS — I10 Essential (primary) hypertension: Secondary | ICD-10-CM | POA: Diagnosis not present

## 2023-01-09 DIAGNOSIS — E538 Deficiency of other specified B group vitamins: Secondary | ICD-10-CM | POA: Diagnosis not present

## 2023-01-14 ENCOUNTER — Ambulatory Visit: Payer: Medicare Other | Admitting: Podiatry

## 2023-01-21 DIAGNOSIS — Z48811 Encounter for surgical aftercare following surgery on the nervous system: Secondary | ICD-10-CM | POA: Diagnosis not present

## 2023-01-21 DIAGNOSIS — M6281 Muscle weakness (generalized): Secondary | ICD-10-CM | POA: Diagnosis not present

## 2023-01-21 DIAGNOSIS — M7138 Other bursal cyst, other site: Secondary | ICD-10-CM | POA: Diagnosis not present

## 2023-01-21 DIAGNOSIS — M545 Low back pain, unspecified: Secondary | ICD-10-CM | POA: Diagnosis not present

## 2023-01-22 DIAGNOSIS — E1159 Type 2 diabetes mellitus with other circulatory complications: Secondary | ICD-10-CM | POA: Diagnosis not present

## 2023-01-28 ENCOUNTER — Ambulatory Visit (INDEPENDENT_AMBULATORY_CARE_PROVIDER_SITE_OTHER): Payer: Medicare Other

## 2023-01-28 ENCOUNTER — Encounter: Payer: Self-pay | Admitting: Podiatry

## 2023-01-28 ENCOUNTER — Ambulatory Visit: Payer: Medicare Other | Admitting: Podiatry

## 2023-01-28 DIAGNOSIS — E1140 Type 2 diabetes mellitus with diabetic neuropathy, unspecified: Secondary | ICD-10-CM

## 2023-01-28 DIAGNOSIS — M2042 Other hammer toe(s) (acquired), left foot: Secondary | ICD-10-CM

## 2023-01-28 DIAGNOSIS — M2041 Other hammer toe(s) (acquired), right foot: Secondary | ICD-10-CM

## 2023-01-28 DIAGNOSIS — L84 Corns and callosities: Secondary | ICD-10-CM | POA: Diagnosis not present

## 2023-01-28 DIAGNOSIS — M2032 Hallux varus (acquired), left foot: Secondary | ICD-10-CM | POA: Diagnosis not present

## 2023-01-29 DIAGNOSIS — M7138 Other bursal cyst, other site: Secondary | ICD-10-CM | POA: Diagnosis not present

## 2023-01-29 DIAGNOSIS — M6281 Muscle weakness (generalized): Secondary | ICD-10-CM | POA: Diagnosis not present

## 2023-01-29 DIAGNOSIS — Z48811 Encounter for surgical aftercare following surgery on the nervous system: Secondary | ICD-10-CM | POA: Diagnosis not present

## 2023-01-29 DIAGNOSIS — M545 Low back pain, unspecified: Secondary | ICD-10-CM | POA: Diagnosis not present

## 2023-01-31 DIAGNOSIS — M545 Low back pain, unspecified: Secondary | ICD-10-CM | POA: Diagnosis not present

## 2023-01-31 DIAGNOSIS — Z48811 Encounter for surgical aftercare following surgery on the nervous system: Secondary | ICD-10-CM | POA: Diagnosis not present

## 2023-01-31 DIAGNOSIS — M6281 Muscle weakness (generalized): Secondary | ICD-10-CM | POA: Diagnosis not present

## 2023-01-31 DIAGNOSIS — M7138 Other bursal cyst, other site: Secondary | ICD-10-CM | POA: Diagnosis not present

## 2023-02-03 NOTE — Progress Notes (Signed)
  Subjective:  Patient ID: Katrina Castro, female    DOB: 1945/08/17,  MRN: [id number]  Chief Complaint  Patient presents with   Nail Problem    dfc    77 y.o. female presents with the above complaint. History confirmed with patient.  She returns for follow-up on multiple pedal issues including her toe deformities and the painful calluses.  Her foot continues to give her quite a bit of trouble with the deformed toes.  Her cardiac issues have stabilized and she is no longer having acute problems.  Objective:  Physical Exam: warm, good capillary refill, no trophic changes or ulcerative lesions, normal DP and PT pulses, and some neuropathy noted, she has painful tender hyperkeratoses left subhallux, left submetatarsal 5, right submetatarsal 4.  Hammertoe deformities, hallux varus on the left.      Radiographs: Multiple views x-ray of the both feet: X-rays check today show multiple digital deformities, hallux varus deformity on the left with arthritic changes in the digits. Assessment:   1. Hammertoe of left foot   2. Hammer toe of right foot   3. Controlled type 2 diabetes with neuropathy (HCC)   4. Callus of foot   5. Pain due to onychomycosis of toenails of both feet   6. Acquired hallux varus of left foot      Plan:  Patient was evaluated and treated and all questions answered.  All symptomatic hyperkeratoses were safely debrided with a sterile #15 blade to patient's level of comfort without incident. We discussed preventative and palliative care of these lesions including supportive and accommodative shoegear, padding, prefabricated and custom molded accommodative orthoses, use of a pumice stone and lotions/creams daily.  New radiographs are taken today we discussed again the multiple digital fomites.  Likely would require pan metatarsal head resection and toe revision as well as MTP fusion of the left foot.  Her diabetes is controlled and her cardiac function has stabilized.   She will let me know when she is ready to proceed consider with this.  No follow-ups on file.

## 2023-02-05 DIAGNOSIS — M7138 Other bursal cyst, other site: Secondary | ICD-10-CM | POA: Diagnosis not present

## 2023-02-05 DIAGNOSIS — M6281 Muscle weakness (generalized): Secondary | ICD-10-CM | POA: Diagnosis not present

## 2023-02-05 DIAGNOSIS — M545 Low back pain, unspecified: Secondary | ICD-10-CM | POA: Diagnosis not present

## 2023-02-05 DIAGNOSIS — Z48811 Encounter for surgical aftercare following surgery on the nervous system: Secondary | ICD-10-CM | POA: Diagnosis not present

## 2023-02-07 DIAGNOSIS — R350 Frequency of micturition: Secondary | ICD-10-CM | POA: Diagnosis not present

## 2023-02-12 ENCOUNTER — Ambulatory Visit: Payer: Medicare Other | Admitting: Internal Medicine

## 2023-02-25 DIAGNOSIS — Z48811 Encounter for surgical aftercare following surgery on the nervous system: Secondary | ICD-10-CM | POA: Diagnosis not present

## 2023-02-25 DIAGNOSIS — M7138 Other bursal cyst, other site: Secondary | ICD-10-CM | POA: Diagnosis not present

## 2023-02-25 DIAGNOSIS — M6281 Muscle weakness (generalized): Secondary | ICD-10-CM | POA: Diagnosis not present

## 2023-02-25 DIAGNOSIS — M545 Low back pain, unspecified: Secondary | ICD-10-CM | POA: Diagnosis not present

## 2023-03-01 DIAGNOSIS — M6281 Muscle weakness (generalized): Secondary | ICD-10-CM | POA: Diagnosis not present

## 2023-03-01 DIAGNOSIS — M7138 Other bursal cyst, other site: Secondary | ICD-10-CM | POA: Diagnosis not present

## 2023-03-01 DIAGNOSIS — Z48811 Encounter for surgical aftercare following surgery on the nervous system: Secondary | ICD-10-CM | POA: Diagnosis not present

## 2023-03-01 DIAGNOSIS — M545 Low back pain, unspecified: Secondary | ICD-10-CM | POA: Diagnosis not present

## 2023-03-04 DIAGNOSIS — M545 Low back pain, unspecified: Secondary | ICD-10-CM | POA: Diagnosis not present

## 2023-03-04 DIAGNOSIS — Z48811 Encounter for surgical aftercare following surgery on the nervous system: Secondary | ICD-10-CM | POA: Diagnosis not present

## 2023-03-04 DIAGNOSIS — I739 Peripheral vascular disease, unspecified: Secondary | ICD-10-CM | POA: Diagnosis not present

## 2023-03-04 DIAGNOSIS — Z79899 Other long term (current) drug therapy: Secondary | ICD-10-CM | POA: Diagnosis not present

## 2023-03-04 DIAGNOSIS — E538 Deficiency of other specified B group vitamins: Secondary | ICD-10-CM | POA: Diagnosis not present

## 2023-03-04 DIAGNOSIS — M7138 Other bursal cyst, other site: Secondary | ICD-10-CM | POA: Diagnosis not present

## 2023-03-04 DIAGNOSIS — M6281 Muscle weakness (generalized): Secondary | ICD-10-CM | POA: Diagnosis not present

## 2023-03-04 DIAGNOSIS — R5383 Other fatigue: Secondary | ICD-10-CM | POA: Diagnosis not present

## 2023-03-04 DIAGNOSIS — R42 Dizziness and giddiness: Secondary | ICD-10-CM | POA: Diagnosis not present

## 2023-03-07 DIAGNOSIS — M7138 Other bursal cyst, other site: Secondary | ICD-10-CM | POA: Diagnosis not present

## 2023-03-07 DIAGNOSIS — M6281 Muscle weakness (generalized): Secondary | ICD-10-CM | POA: Diagnosis not present

## 2023-03-07 DIAGNOSIS — M545 Low back pain, unspecified: Secondary | ICD-10-CM | POA: Diagnosis not present

## 2023-03-07 DIAGNOSIS — Z48811 Encounter for surgical aftercare following surgery on the nervous system: Secondary | ICD-10-CM | POA: Diagnosis not present

## 2023-03-11 DIAGNOSIS — M6281 Muscle weakness (generalized): Secondary | ICD-10-CM | POA: Diagnosis not present

## 2023-03-11 DIAGNOSIS — M545 Low back pain, unspecified: Secondary | ICD-10-CM | POA: Diagnosis not present

## 2023-03-11 DIAGNOSIS — M7138 Other bursal cyst, other site: Secondary | ICD-10-CM | POA: Diagnosis not present

## 2023-03-11 DIAGNOSIS — Z48811 Encounter for surgical aftercare following surgery on the nervous system: Secondary | ICD-10-CM | POA: Diagnosis not present

## 2023-03-13 DIAGNOSIS — M9902 Segmental and somatic dysfunction of thoracic region: Secondary | ICD-10-CM | POA: Diagnosis not present

## 2023-03-13 DIAGNOSIS — Z48811 Encounter for surgical aftercare following surgery on the nervous system: Secondary | ICD-10-CM | POA: Diagnosis not present

## 2023-03-13 DIAGNOSIS — M545 Low back pain, unspecified: Secondary | ICD-10-CM | POA: Diagnosis not present

## 2023-03-13 DIAGNOSIS — M47816 Spondylosis without myelopathy or radiculopathy, lumbar region: Secondary | ICD-10-CM | POA: Diagnosis not present

## 2023-03-13 DIAGNOSIS — M546 Pain in thoracic spine: Secondary | ICD-10-CM | POA: Diagnosis not present

## 2023-03-13 DIAGNOSIS — M6281 Muscle weakness (generalized): Secondary | ICD-10-CM | POA: Diagnosis not present

## 2023-03-13 DIAGNOSIS — M47812 Spondylosis without myelopathy or radiculopathy, cervical region: Secondary | ICD-10-CM | POA: Diagnosis not present

## 2023-03-13 DIAGNOSIS — M9903 Segmental and somatic dysfunction of lumbar region: Secondary | ICD-10-CM | POA: Diagnosis not present

## 2023-03-13 DIAGNOSIS — M9901 Segmental and somatic dysfunction of cervical region: Secondary | ICD-10-CM | POA: Diagnosis not present

## 2023-03-13 DIAGNOSIS — Z299 Encounter for prophylactic measures, unspecified: Secondary | ICD-10-CM | POA: Diagnosis not present

## 2023-03-13 DIAGNOSIS — S233XXA Sprain of ligaments of thoracic spine, initial encounter: Secondary | ICD-10-CM | POA: Diagnosis not present

## 2023-03-13 DIAGNOSIS — S338XXA Sprain of other parts of lumbar spine and pelvis, initial encounter: Secondary | ICD-10-CM | POA: Diagnosis not present

## 2023-03-13 DIAGNOSIS — M7138 Other bursal cyst, other site: Secondary | ICD-10-CM | POA: Diagnosis not present

## 2023-03-14 ENCOUNTER — Telehealth: Payer: Self-pay

## 2023-03-14 ENCOUNTER — Telehealth: Payer: Self-pay | Admitting: Internal Medicine

## 2023-03-14 NOTE — Telephone Encounter (Signed)
Patient asking if she can get samples of Jardiance she is advising she can't afford the medication. Please advise

## 2023-03-14 NOTE — Telephone Encounter (Signed)
Samples of this drug (Jardiance 25mg ) were given to the patient, quantity 3 boxes, Lot Number [id number] exp 2/26

## 2023-03-18 ENCOUNTER — Encounter (HOSPITAL_COMMUNITY): Payer: Self-pay | Admitting: Emergency Medicine

## 2023-03-18 ENCOUNTER — Other Ambulatory Visit: Payer: Self-pay | Admitting: Physician Assistant

## 2023-03-18 ENCOUNTER — Emergency Department (HOSPITAL_COMMUNITY): Payer: Medicare Other

## 2023-03-18 ENCOUNTER — Telehealth: Payer: Self-pay | Admitting: Internal Medicine

## 2023-03-18 ENCOUNTER — Emergency Department (HOSPITAL_COMMUNITY)
Admission: EM | Admit: 2023-03-18 | Discharge: 2023-03-18 | Disposition: A | Discharge status: Home / Self Care | Payer: Medicare Other | Attending: Emergency Medicine | Admitting: Emergency Medicine

## 2023-03-18 ENCOUNTER — Other Ambulatory Visit: Payer: Self-pay

## 2023-03-18 ENCOUNTER — Telehealth: Payer: Self-pay | Admitting: Physician Assistant

## 2023-03-18 DIAGNOSIS — R079 Chest pain, unspecified: Secondary | ICD-10-CM | POA: Diagnosis not present

## 2023-03-18 DIAGNOSIS — Z79899 Other long term (current) drug therapy: Secondary | ICD-10-CM | POA: Insufficient documentation

## 2023-03-18 DIAGNOSIS — F419 Anxiety disorder, unspecified: Secondary | ICD-10-CM | POA: Diagnosis not present

## 2023-03-18 DIAGNOSIS — R0789 Other chest pain: Secondary | ICD-10-CM | POA: Diagnosis not present

## 2023-03-18 DIAGNOSIS — Z7984 Long term (current) use of oral hypoglycemic drugs: Secondary | ICD-10-CM | POA: Insufficient documentation

## 2023-03-18 DIAGNOSIS — R001 Bradycardia, unspecified: Secondary | ICD-10-CM | POA: Insufficient documentation

## 2023-03-18 DIAGNOSIS — Z7982 Long term (current) use of aspirin: Secondary | ICD-10-CM | POA: Insufficient documentation

## 2023-03-18 LAB — CBC
HCT: 39.1 % (ref 36.0–46.0)
Hemoglobin: 12 g/dL (ref 12.0–15.0)
MCH: 25.9 pg — ABNORMAL LOW (ref 26.0–34.0)
MCHC: 30.7 g/dL (ref 30.0–36.0)
MCV: 84.4 fL (ref 80.0–100.0)
Platelets: 319 10*3/uL (ref 150–400)
RBC: 4.63 MIL/uL (ref 3.87–5.11)
RDW: 13.3 % (ref 11.5–15.5)
WBC: 7.3 10*3/uL (ref 4.0–10.5)
nRBC: 0 % (ref 0.0–0.2)

## 2023-03-18 LAB — BASIC METABOLIC PANEL
Anion gap: 9 (ref 5–15)
BUN: 12 mg/dL (ref 8–23)
CO2: 26 mmol/L (ref 22–32)
Calcium: 9.6 mg/dL (ref 8.9–10.3)
Chloride: 103 mmol/L (ref 98–111)
Creatinine, Ser: 0.6 mg/dL (ref 0.44–1.00)
GFR, Estimated: >60 mL/min (ref >60)
Glucose, Bld: 128 mg/dL — ABNORMAL HIGH (ref 70–99)
Potassium: 3.7 mmol/L (ref 3.5–5.1)
Sodium: 138 mmol/L (ref 135–145)

## 2023-03-18 LAB — TROPONIN I (HIGH SENSITIVITY)
Troponin I (High Sensitivity): 2 ng/L (ref <18)
Troponin I (High Sensitivity): 2 ng/L (ref <18)

## 2023-03-18 MED ORDER — ACETAMINOPHEN 325 MG PO TABS
650.0000 mg | ORAL_TABLET | Freq: Once | ORAL | Status: AC
  Administered 2023-03-18: 650 mg via ORAL
  Filled 2023-03-18: qty 2

## 2023-03-18 MED ORDER — ALUM & MAG HYDROXIDE-SIMETH 200-200-20 MG/5ML PO SUSP
30.0000 mL | Freq: Once | ORAL | Status: AC
  Administered 2023-03-18: 30 mL via ORAL
  Filled 2023-03-18: qty 30

## 2023-03-18 MED ORDER — ISOSORBIDE MONONITRATE ER 30 MG PO TB24: 30.0000 mg | ORAL_TABLET | Freq: Every day | ORAL | 0 refills | Status: DC

## 2023-03-18 MED ORDER — LIDOCAINE VISCOUS HCL 2 % MT SOLN
15.0000 mL | Freq: Once | OROMUCOSAL | Status: AC
  Administered 2023-03-18: 15 mL via ORAL
  Filled 2023-03-18: qty 15

## 2023-03-18 NOTE — ED Provider Notes (Signed)
[place] EMERGENCY DEPARTMENT AT [facility] Provider Note   CSN: [id number] Arrival date & time: 03/18/23  1131     History  Chief Complaint  Patient presents with   Chest Pain    Katrina Castro is a 77 y.o. female, history of 5 CABGs, who presents to the ED secondary to chest pain that started last night, and an unknown time.  States she had pressurized chest pain, on the left side of her chest, and became very anxious so she took a Xanax and went to bed.  When she woke up her left arm was numb, but it went away with time.  She states that she is not having any chest pain, or shortness of breath.  She did have shortness of breath on exertion, couple days ago, but this is since resolved.  She states she feels well now, except for when she presses on her chest it hurts.  Denies any recent trauma, but has been exercising at therapy more frequently.  Denies any abdominal, nausea, vomiting, or back pain.  Was told to come to the ER by her cardiologist  Home Medications Prior to Admission medications   Medication Sig Start Date End Date Taking? Authorizing Provider  isosorbide mononitrate (IMDUR) 30 MG 24 hr tablet Take 1 tablet (30 mg total) by mouth daily. 03/18/23  Yes Jaelle Campanile L, PA  acetaminophen (TYLENOL) 650 MG CR tablet Take 650 mg by mouth every 8 (eight) hours as needed for pain.    [provider]  acyclovir (ZOVIRAX) 400 MG tablet Take 400 mg by mouth daily.    [provider]  ALPRAZolam Prudy Feeler) 0.5 MG tablet Take 0.25 mg by mouth at bedtime as needed for sleep. 04/01/19   [provider]  aspirin 81 MG EC tablet Take 1 tablet (81 mg total) by mouth daily. Swallow whole. 07/21/21   Pricilla Riffle, MD  Biotin w/ Vitamins C & E (HAIR SKIN & NAILS GUMMIES PO) Take 2 capsules by mouth daily.    [provider]  Calcium-Phosphorus-Vitamin D (CALCIUM GUMMIES PO) Take 1 tablet by mouth daily. Gummy    [provider]   Cholecalciferol (VITAMIN D) 50 MCG (2000 UT) tablet Take 2,000 Units by mouth daily.    [provider]  Cyanocobalamin (B-12 PO) Take 1 tablet by mouth daily. Gummies    [provider]  cyanocobalamin (VITAMIN B12) 1000 MCG/ML injection Inject 1,000 mcg into the muscle every 30 (thirty) days. 10/25/22   [provider]  cyclobenzaprine (FLEXERIL) 10 MG tablet Take 1 tablet (10 mg total) by mouth 3 (three) times daily as needed for muscle spasms. 11/21/22   Julio Sicks, MD  dicyclomine (BENTYL) 10 MG capsule Take 1 capsule (10 mg total) by mouth every 12 (twelve) hours as needed for spasms. 11/15/22   Carlan, Chelsea L, NP  ELDERBERRY PO Take 1 tablet by mouth daily. Gummy    [provider]  empagliflozin (JARDIANCE) 25 MG TABS tablet TAKE 1 TABLET BY MOUTH DAILY BEFORE BREAKFAST. 10/04/22   Carlus Pavlov, MD  ezetimibe (ZETIA) 10 MG tablet TAKE 1 TABLET ONCE DAILY. 11/28/22   Pricilla Riffle, MD  ferrous sulfate 325 (65 FE) MG EC tablet Take 325 mg by mouth 2 (two) times a week.    [provider]  glipiZIDE (GLUCOTROL XL) 2.5 MG 24 hr tablet Take 1 tablet (2.5 mg total) by mouth daily with breakfast. 08/31/22   Carlus Pavlov, MD  HYDROcodone-acetaminophen (  NORCO/VICODIN) 5-325 MG tablet Take 1 tablet by mouth every 4 (four) hours as needed for moderate pain ((score 4 to 6)). 11/21/22   Julio Sicks, MD  levocetirizine (XYZAL) 5 MG tablet Take 5 mg by mouth every evening.    [provider]  Menthol-Methyl Salicylate (MUSCLE RUB EX) Apply 1 application topically daily as needed (muscle pain). [place] gel    [provider]  metFORMIN (GLUCOPHAGE) 1000 MG tablet Take 1,000 mg by mouth 2 (two) times daily. 08/14/22   [provider]  metoprolol tartrate (LOPRESSOR) 25 MG tablet TAKE (1/2) TABLET BY MOUTH 2 TIMES A DAY. 10/04/22   Pricilla Riffle, MD  nitroGLYCERIN (NITROSTAT) 0.4 MG SL tablet Place 1 tablet (0.4 mg total) under the tongue  every 5 (five) minutes as needed for chest pain. 11/06/21   Pricilla Riffle, MD  omeprazole (PRILOSEC) 40 MG capsule Take 1 capsule (40 mg total) by mouth daily. 11/15/22   Carlan, Chelsea L, NP  oxybutynin (DITROPAN-XL) 10 MG 24 hr tablet Take 10 mg by mouth every evening.    [provider]  rosuvastatin (CRESTOR) 40 MG tablet Take 40 mg by mouth every evening.    [provider]      Allergies    Bee venom, Levofloxacin, Cardizem [diltiazem], Sulfa antibiotics, Diazepam, and Lipitor [atorvastatin]    Review of Systems   Review of Systems  Cardiovascular:  Positive for chest pain.  Gastrointestinal:  Negative for abdominal pain.    Physical Exam Updated Vital Signs BP (!) 143/76 (BP Location: Left Arm)   Pulse (!) 54   Temp 98.6 F (37 C) (Oral)   Resp 20   Ht 5\' 3"  (1.6 m)   Wt 58.1 kg   SpO2 99%   BMI 22.67 kg/m  Physical Exam Vitals and nursing note reviewed.  Constitutional:      General: She is not in acute distress.    Appearance: She is well-developed.  HENT:     Head: Normocephalic and atraumatic.  Eyes:     Conjunctiva/sclera: Conjunctivae normal.  Cardiovascular:     Rate and Rhythm: Normal rate and regular rhythm.     Heart sounds: No murmur heard. Pulmonary:     Effort: Pulmonary effort is normal. No respiratory distress.     Breath sounds: Normal breath sounds.  Chest:     Chest wall: No mass, lacerations, deformity, swelling, crepitus or edema.     Comments: Tenderness to palpation of left upper chest wall Abdominal:     Palpations: Abdomen is soft.     Tenderness: There is no abdominal tenderness.  Musculoskeletal:        General: No swelling.     Cervical back: Neck supple.  Skin:    General: Skin is warm and dry.     Capillary Refill: Capillary refill takes less than 2 seconds.  Neurological:     Mental Status: She is alert.  Psychiatric:        Mood and Affect: Mood normal.     ED Results / Procedures / Treatments    Labs (all labs ordered are listed, but only abnormal results are displayed) Labs Reviewed  BASIC METABOLIC PANEL - Abnormal; Notable for the following components:      Result Value   Glucose, Bld 128 (*)    All other components within normal limits  CBC - Abnormal; Notable for the following components:   MCH 25.9 (*)    All other components within normal limits  TROPONIN I (HIGH SENSITIVITY)  TROPONIN I (HIGH SENSITIVITY)    EKG EKG Interpretation Date/Time:  Monday March 18 2023 11:41:54 EDT Ventricular Rate:  54 PR Interval:  162 QRS Duration:  76 QT Interval:  466 QTC Calculation: 441 R Axis:   6  Text Interpretation: Sinus bradycardia with occasional Premature ventricular complexes Confirmed by Gloris Manchester [phone]) on 03/18/2023 4:10:12 PM  Radiology DG Chest 2 View  Result Date: 03/18/2023 CLINICAL DATA:  Chest pain. EXAM: CHEST - 2 VIEW COMPARISON:  July 19, 2020. FINDINGS: The heart size and mediastinal contours are within normal limits. Sternotomy wires are noted. Both lungs are clear. The visualized skeletal structures are unremarkable. IMPRESSION: No active cardiopulmonary disease. Electronically Signed   By: Lupita Raider M.D.   On: 03/18/2023 14:30    Procedures Procedures    Medications Ordered in ED Medications  alum & mag hydroxide-simeth (MAALOX/MYLANTA) 200-200-20 MG/5ML suspension 30 mL (has no administration in time range)    And  lidocaine (XYLOCAINE) 2 % viscous mouth solution 15 mL (has no administration in time range)  acetaminophen (TYLENOL) tablet 650 mg (650 mg Oral Given 03/18/23 1609)    ED Course/ Medical Decision Making/ A&P             HEART Score: 7                    Medical Decision Making Patient is a 77 year old female, history of 5 CABGs, who presents to the ED for chest pain, that started last night, lasted for few hours, and then resolved.  Also reports dyspnea on exertion earlier this week.  We will obtain troponins, chest  x-ray for further evaluation.  Pain is not pleuritic.  Recommended a CTA dissection study, given the left arm numbness, but has now resolved, patient states she did not want to pursue this, and was aware of risk associated with this.  Discussed risk associated with this patient voiced understanding.  Amount and/or Complexity of Data Reviewed Labs: ordered.    Details: Troponin x 2 within normal limits Radiology: ordered.    Details: Chest x-ray clear ECG/medicine tests:  Decision-making details documented in ED Course. Discussion of management or test interpretation with external provider(s): Discussed with Dr. Diona Browner, cardiologist, who sent patient in, patient's troponins are negative, however heart score of 7, he recommends placing.  Patient on Imdur, 30 mg daily, and have her follow-up in office, for stress test.  Discussed with patient, and she voiced understanding, Imdur sent to the pharmacy.  She is chest pain-free at my time of eval.  Discharged home with strict return precautions  Risk OTC drugs. Prescription drug management.  Final Clinical Impression(s) / ED Diagnoses Final diagnoses:  Chest pain, unspecified type    Rx / DC Orders ED Discharge Orders          Ordered    isosorbide mononitrate (IMDUR) 30 MG 24 hr tablet  Daily        03/18/23 1631              Tessica Cupo Elbert Ewings, PA 03/18/23 1637    Vanetta Mulders, MD 03/19/23 0010

## 2023-03-18 NOTE — Progress Notes (Signed)
NST per Dr. Diona Browner

## 2023-03-18 NOTE — Telephone Encounter (Signed)
Pt called and states that she has had gas pain fot the last 2 days. Started having chest pain on yesterday night. Pt states that 1 week ago while walking her dog she felt SOB. Pt reports feeling weak and fatigue over the last week. Pt reports that she started taking a probiotic on yesterday and has gas. She states that the pain is in her chest an feels tight. States that the left hand feels tingling. Current BP is 151/73 Hr 68.

## 2023-03-18 NOTE — Plan of Care (Signed)
    Patient Name: Katrina Castro Date of Encounter: 03/18/2023  Primary Cardiologist: Dietrich Pates, MD  Summary of Phone Communication   Spoke with [name] PA-C regarding her ER evaluation of Katrina Castro.  Patient called the office earlier reporting chest discomfort.  History obtained in the ER is that she had chest discomfort yesterday/evening, no recurrence today.  Some shortness of breath recently.  Also feelings of indigestion.  She did not take nitroglycerin.  Was reported to be symptom-free in the ER.  I reviewed her lab work and ECG as well as chest x-ray noted below.  She has a history of CAD status post CABG in 2021, also type 2 diabetes mellitus and hypertension.  Present outpatient cardiac regimen includes aspirin, Jardiance, Zetia, Lopressor, Crestor, and as needed nitroglycerin.  Vital Signs    Vitals:   03/18/23 1135 03/18/23 1304  BP:  (!) 143/76  Pulse:  (!) 54  Resp:  20  Temp:  98.6 F (37 C)  TempSrc:  Oral  SpO2:  99%  Weight: 58.1 kg   Height: 5\' 3"  (1.6 m)    No intake or output data in the 24 hours ending 03/18/23 1630 Filed Weights   03/18/23 1135  Weight: 58.1 kg    ECG/Telemetry    ECG today shows sinus bradycardia with PVC, no acute ST segment changes.  Labs    Chemistry Recent Labs  Lab 03/18/23 1155  NA 138  K 3.7  CL 103  CO2 26  GLUCOSE 128*  BUN 12  CREATININE 0.60  CALCIUM 9.6  GFRNONAA >60  ANIONGAP 9    Hematology Recent Labs  Lab 03/18/23 1155  WBC 7.3  RBC 4.63  HGB 12.0  HCT 39.1  MCV 84.4  MCH 25.9*  MCHC 30.7  RDW 13.3  PLT 319   Cardiac Enzymes Recent Labs  Lab 03/18/23 1155 03/18/23 1425  TROPONINIHS 2 2   Lipid Panel     Component Value Date/Time   CHOL 112 03/23/2022 1343   CHOL 217 (H) 03/31/2020 1208   TRIG 100 09/24/2022 1350   HDL 63 09/24/2022 1350   CHOLHDL 2.3 03/23/2022 1343   VLDL 28 03/23/2022 1343   LDLCALC 36 03/23/2022 1343   LDLCALC 144 (H) 03/31/2020 1208   LABVLDL 35  03/31/2020 1208   Chest x-ray reports no acute process.  Management Plan   Anticipate discharge home from the ER.  Recommended adding Imdur 30 mg daily to present regimen and we will arrange a follow-up Lexiscan Myoview for ischemic surveillance as well as office follow-up for clinical reassessment.  Signed, Nona Dell, MD  03/18/2023, 4:30 PM

## 2023-03-18 NOTE — Discharge Instructions (Addendum)
Please follow-up with your cardiologist, I spoke with Dr. Diona Browner, he is going to help set up a stress test for you outpatient, please call the office to discuss this. Additionally, we are going to start you on a new medicine called imdur. It is a long lasting nitroglycerin, please take it once a day. Return to the ED if you feel like your symptoms are worsening.

## 2023-03-18 NOTE — Telephone Encounter (Signed)
Will call pt 10/01 and advise what time and date for Stress test.

## 2023-03-18 NOTE — Telephone Encounter (Signed)
Pt notified of Dr. Ival Bible response and the need to be seen in the ER. Pt agrees with plan of care and agrees to get ready and go.

## 2023-03-18 NOTE — Telephone Encounter (Signed)
Dr. Diona Browner fielded call from EDP today. Patient going home from ER today. Dr. Diona Browner recommended Lexiscan stress test and follow-up. Malanie helped me schedule the nuc for 10/8, and also has appointment on 10/30 with Dr. Tenny Craw. Given that ER DC instructions were already done, can you please call patient on Tuesday AM to go over stress test instructions and remind her of appt dates/times?  Thank you!

## 2023-03-18 NOTE — ED Provider Triage Note (Signed)
Emergency Medicine Provider Triage Evaluation Note  Katrina Castro , a 77 y.o. female  was evaluated in triage.  Pt complains of chest pain that started last night described as stabbing, states she is feeling better, called her cardiology office who advised she come to the ER for evaluation.  Review of Systems  Positive: Chest pain now resolved Negative: Shortness of breath  Physical Exam  Ht 5\' 3"  (1.6 m)   Wt 58.1 kg   BMI 22.67 kg/m  Gen:   Awake, no distress   Resp:  Normal effort  MSK:   Moves extremities without difficulty  Other:    Medical Decision Making  Medically screening exam initiated at 12:55 PM.  Appropriate orders placed.  Katrina Castro was informed that the remainder of the evaluation will be completed by another provider, this initial triage assessment does not replace that evaluation, and the importance of remaining in the ED until their evaluation is complete.     Katrina Castro, [place] 03/18/23 1255

## 2023-03-18 NOTE — ED Triage Notes (Signed)
Pt presents with left-sided CP that started last night, with numbness in left arm, also has had indigestion for several days.

## 2023-03-18 NOTE — Telephone Encounter (Signed)
Chest tightness   Pt c/o of Chest Pain: STAT if CP now or developed within 24 hours  1. Are you having CP right now? yes  2. Are you experiencing any other symptoms (ex. SOB, nausea, vomiting, sweating)? Cramps, pain legs, gas  3. How long have you been experiencing CP? Yesterday   4. Is your CP continuous or coming and going? Continuous   5. Have you taken Nitroglycerin? no ?

## 2023-03-19 ENCOUNTER — Telehealth: Payer: Self-pay | Admitting: Physician Assistant

## 2023-03-19 NOTE — Telephone Encounter (Signed)
Patient wanted to discuss why she is having a nuclear stress test for CP.I confirmed she should arrive at 0930 hrs on 03/26/23

## 2023-03-19 NOTE — Telephone Encounter (Signed)
Patient called to ask questions about the stress test

## 2023-03-19 NOTE — Telephone Encounter (Signed)
Spoke to pt who verbalized understanding. Instruction letter printed and mailed to pt. Pt advised of date/time for stress test.

## 2023-03-20 ENCOUNTER — Telehealth (INDEPENDENT_AMBULATORY_CARE_PROVIDER_SITE_OTHER): Payer: Self-pay | Admitting: *Deleted

## 2023-03-20 ENCOUNTER — Ambulatory Visit: Payer: Medicare Other | Admitting: Internal Medicine

## 2023-03-20 ENCOUNTER — Encounter (INDEPENDENT_AMBULATORY_CARE_PROVIDER_SITE_OTHER): Payer: Self-pay

## 2023-03-20 NOTE — Telephone Encounter (Signed)
Katrina Castro, please schedule appt. Pt wants to see Katrina Castro.   Discussed with patient and she wants to see only Katrina Castro.

## 2023-03-20 NOTE — Telephone Encounter (Signed)
Patient states she is having bloating, gas, and frequent stools 2 -3 times per day for the past 3 weeks. States stools are normal but having more frequent stools. She use to have about 2 -3 stools per week. Tried gas x and it did not help. No blood in stool, having gas pain, no fever. Patient states she was at ED on 9/30 for chest pain. She states she is not having any pain now. She states she went to hospital for lab work and they checked her into the ED.   [place].

## 2023-03-21 ENCOUNTER — Ambulatory Visit (INDEPENDENT_AMBULATORY_CARE_PROVIDER_SITE_OTHER): Payer: Medicare Other | Admitting: Gastroenterology

## 2023-03-25 ENCOUNTER — Ambulatory Visit (INDEPENDENT_AMBULATORY_CARE_PROVIDER_SITE_OTHER): Payer: Medicare Other | Admitting: Gastroenterology

## 2023-03-25 NOTE — Telephone Encounter (Signed)
Patient came in to office today and picked up sample of Jardiance-3 boxes.

## 2023-03-26 ENCOUNTER — Ambulatory Visit (HOSPITAL_COMMUNITY)
Admission: RE | Admit: 2023-03-26 | Discharge: 2023-03-26 | Disposition: A | Discharge status: Home / Self Care | Payer: Medicare Other | Source: Ambulatory Visit | Attending: Internal Medicine | Admitting: Internal Medicine

## 2023-03-26 ENCOUNTER — Encounter (HOSPITAL_BASED_OUTPATIENT_CLINIC_OR_DEPARTMENT_OTHER)
Admission: RE | Admit: 2023-03-26 | Discharge: 2023-03-26 | Disposition: A | Discharge status: Home / Self Care | Payer: Medicare Other | Source: Ambulatory Visit | Attending: Cardiology | Admitting: Cardiology

## 2023-03-26 DIAGNOSIS — R079 Chest pain, unspecified: Secondary | ICD-10-CM | POA: Insufficient documentation

## 2023-03-26 LAB — NM MYOCAR MULTI W/SPECT W/WALL MOTION / EF
Base ST Depression (mm): 0 mm
LV dias vol: 48 mL (ref 46–106)
LV sys vol: 10 mL
Nuc Stress EF: 79 %
Peak HR: 99 {beats}/min
RATE: 0.3
Rest HR: 60 {beats}/min
Rest Nuclear Isotope Dose: 11 mCi
SDS: 3
SRS: 1
SSS: 4
ST Depression (mm): 0 mm
Stress Nuclear Isotope Dose: 32.2 mCi
TID: 1.1

## 2023-03-26 MED ORDER — TECHNETIUM TC 99M TETROFOSMIN IV KIT
32.2000 | PACK | Freq: Once | INTRAVENOUS | Status: AC | PRN
  Administered 2023-03-26: 32.2 via INTRAVENOUS

## 2023-03-26 MED ORDER — TECHNETIUM TC 99M TETROFOSMIN IV KIT
11.0000 | PACK | Freq: Once | INTRAVENOUS | Status: AC | PRN
  Administered 2023-03-26: 11 via INTRAVENOUS

## 2023-03-26 MED ORDER — REGADENOSON 0.4 MG/5ML IV SOLN
INTRAVENOUS | Status: AC
  Administered 2023-03-26: 0.4 mg via INTRAVENOUS
  Filled 2023-03-26: qty 5

## 2023-03-26 MED ORDER — SODIUM CHLORIDE FLUSH 0.9 % IV SOLN
INTRAVENOUS | Status: AC
  Administered 2023-03-26: 10 mL via INTRAVENOUS
  Filled 2023-03-26: qty 10

## 2023-03-28 ENCOUNTER — Encounter (INDEPENDENT_AMBULATORY_CARE_PROVIDER_SITE_OTHER): Payer: Self-pay | Admitting: Gastroenterology

## 2023-03-28 ENCOUNTER — Ambulatory Visit (INDEPENDENT_AMBULATORY_CARE_PROVIDER_SITE_OTHER): Payer: Medicare Other | Admitting: Gastroenterology

## 2023-03-28 VITALS — BP 115/59 | HR 64 | Temp 97.5°F | Ht 63.0 in | Wt 132.0 lb

## 2023-03-28 DIAGNOSIS — R140 Abdominal distension (gaseous): Secondary | ICD-10-CM

## 2023-03-28 DIAGNOSIS — R109 Unspecified abdominal pain: Secondary | ICD-10-CM

## 2023-03-28 DIAGNOSIS — R197 Diarrhea, unspecified: Secondary | ICD-10-CM

## 2023-03-28 MED ORDER — METRONIDAZOLE 500 MG PO TABS
500.0000 mg | ORAL_TABLET | Freq: Three times a day (TID) | ORAL | 0 refills | Status: AC
Start: 2023-03-28 — End: 2023-04-11

## 2023-03-28 MED ORDER — DICYCLOMINE HCL 10 MG PO CAPS
10.0000 mg | ORAL_CAPSULE | Freq: Two times a day (BID) | ORAL | 1 refills | Status: DC | PRN
Start: 2023-03-28 — End: 2023-07-15

## 2023-03-28 NOTE — Progress Notes (Signed)
[name], M.D. Gastroenterology & Hepatology [facility] Gastroenterology [address] [place], [place] [address]  Primary Care Physician: Ignatius Specking, MD [address] [place] [address]  I will communicate my assessment and recommendations to the referring MD via EMR.  Problems: Acute diarrhea and bloating Chronic dysphagia, possibly functional in nature  History of Present Illness: Katrina Castro is a 77 y.o. female with past medical history of  c. Difficile, coronary artery disease status post CABG, hypertension, GERD, type III achalasia, type 2 diabetes and depression, who presents for evaluation of diarrhea and bloating.  The patient was last seen on 11/15/2022. At that time, the patient was continued on omeprazole 40 mg every day, dicyclomine as needed and to increase the intake of fiber for constipation.  Patient reports that for the last 3 weeks, she has presented recurrent episodes of bloating and abdominal rumbling. She reports that sometimes the gas sensation rolls "over her abdomen" and causes significant discomfort. She states that she is passing gas frequently and gas has a foul smell. She reports that she started having diarrhea around 3 weeks ago which lasted for close to 2 weeks. She states she tried taking Imodium a week ago, which led to normal consistency of the stool. She has taken Gas-X tablets since then without relief of her abdominal bloating.  The patient denies having any nausea, vomiting, fever, chills, hematochezia, melena, hematemesis, jaundice, pruritus or weight loss.  esophageal manometry-[facility] on February 2017 which showed findings suggestive of type III achalasia as her IRP was 22 with presence of a spastic esophagus. She had previously responded to the use of Botox .  Patient was referred to [facility] in 2023 and she was seen by Dr. Jory Ee.  She underwent a manometry that did not show any spasm or  increased contractility, as well as no abnormalities in the motility per the report.  Also had a normal time barium swallow.  Symptoms were considered to be functional in nature.  Last Colonoscopy:2020 - The examined portion of the ileum was normal.                           - One small polyp in the proximal sigmoid colon.                            Biopsied-TA                            - External hemorrhoids. Last Endoscopy:08/2022- Normal esophagus. Dilated. Injected with                            botulinum toxin.                           - Normal stomach.                           - Normal examined duodenum.                           - No specimens collected.  Past Medical History: Past Medical History:  Diagnosis Date   Arthritis    CAD (coronary artery disease)    a.  s/p CABG on 04/27/2020 with LIMA-LAD, seq SVG-PDA-PL, seq left radial-RI-OM   Concussion    2015   Depression    Diabetes (HCC)    type 2   Dysphagia    Dysrhythmia    afib   GERD (gastroesophageal reflux disease)    Headache    History of bronchitis    Hypertension    Seasonal allergies    Toenail fungus     Past Surgical History: Past Surgical History:  Procedure Laterality Date   APPENDECTOMY     BACK SURGERY     x2   BOTOX INJECTION N/A 06/06/2016   Procedure: BOTOX INJECTION;  Surgeon: Malissa Hippo, MD;  [facility];  Service: Endoscopy;  Laterality: N/A;   BOTOX INJECTION N/A 01/17/2021   Procedure: BOTOX INJECTION;  Surgeon: Dolores Frame, MD;  [facility];  Service: Gastroenterology;  Laterality: N/A;   BOTOX INJECTION N/A 07/11/2021   Procedure: BOTOX INJECTION;  Surgeon: Dolores Frame, MD;  [facility];  Service: Gastroenterology;  Laterality: N/A;   BOTOX INJECTION N/A 08/21/2022   Procedure: BOTOX INJECTION;  Surgeon: Dolores Frame, MD;  [facility];  Service: Gastroenterology;  Laterality: N/A;    CHOLECYSTECTOMY     COLONOSCOPY N/A 11/05/2012   Procedure: COLONOSCOPY;  Surgeon: Malissa Hippo, MD;  [facility];  Service: Endoscopy;  Laterality: N/A;  1030   COLONOSCOPY WITH PROPOFOL N/A 05/01/2019   Procedure: COLONOSCOPY WITH PROPOFOL;  Surgeon: Malissa Hippo, MD;  [facility];  Service: Endoscopy;  Laterality: N/A;  11:20am   CORONARY ARTERY BYPASS GRAFT N/A 04/27/2020   Procedure: CORONARY ARTERY BYPASS GRAFTING (CABG) TIMES FIVE USING LEFT INTERNAL MAMMARY ARTERY, LEFT HARVESTED RADIAL ARTERY, RIGHT GREATER SAPHENOUS VEIN HARVESTED ENDOSCOPICALLY.;  Surgeon: Linden Dolin, MD;  Location: [facility];  Service: Open Heart Surgery;  Laterality: N/A;   ESOPHAGEAL DILATION N/A 07/06/2015   Procedure: ESOPHAGEAL DILATION;  Surgeon: Malissa Hippo, MD;  [facility];  Service: Endoscopy;  Laterality: N/A;   ESOPHAGEAL DILATION N/A 06/06/2016   Procedure: ESOPHAGEAL DILATION;  Surgeon: Malissa Hippo, MD;  [facility];  Service: Endoscopy;  Laterality: N/A;   ESOPHAGEAL DILATION N/A 08/21/2022   Procedure: ESOPHAGEAL DILATION;  Surgeon: Dolores Frame, MD;  [facility];  Service: Gastroenterology;  Laterality: N/A;   ESOPHAGOGASTRODUODENOSCOPY N/A 02/12/2014   Procedure: ESOPHAGOGASTRODUODENOSCOPY (EGD);  Surgeon: Malissa Hippo, MD;  [facility];  Service: Endoscopy;  Laterality: N/A;  150   ESOPHAGOGASTRODUODENOSCOPY N/A 07/06/2015   Procedure: ESOPHAGOGASTRODUODENOSCOPY (EGD);  Surgeon: Malissa Hippo, MD;  [facility];  Service: Endoscopy;  Laterality: N/A;  1:25 - moved to 1/18 @ 10:30 - Ann to notify pt   ESOPHAGOGASTRODUODENOSCOPY (EGD) WITH ESOPHAGEAL DILATION N/A 07/25/2012   Procedure: ESOPHAGOGASTRODUODENOSCOPY (EGD) WITH ESOPHAGEAL DILATION;  Surgeon: Malissa Hippo, MD;  [facility];  Service: Endoscopy;  Laterality: N/A;  325-rescheduled to 855 Ann notified pt    ESOPHAGOGASTRODUODENOSCOPY (EGD) WITH PROPOFOL N/A 06/06/2016   Procedure: ESOPHAGOGASTRODUODENOSCOPY (EGD) WITH PROPOFOL;  Surgeon: Malissa Hippo, MD;  [facility];  Service: Endoscopy;  Laterality: N/A;   ESOPHAGOGASTRODUODENOSCOPY (EGD) WITH PROPOFOL N/A 01/17/2021   Procedure: ESOPHAGOGASTRODUODENOSCOPY (EGD) WITH PROPOFOL;  Surgeon: Dolores Frame, MD;  [facility];  Service: Gastroenterology;  Laterality: N/A;  10:35   ESOPHAGOGASTRODUODENOSCOPY (EGD) WITH PROPOFOL N/A 07/11/2021  Procedure: ESOPHAGOGASTRODUODENOSCOPY (EGD) WITH PROPOFOL;  Surgeon: Dolores Frame, MD;  [facility];  Service: Gastroenterology;  Laterality: N/A;  11:15   ESOPHAGOGASTRODUODENOSCOPY (EGD) WITH PROPOFOL N/A 08/21/2022   Procedure: ESOPHAGOGASTRODUODENOSCOPY (EGD) WITH PROPOFOL;  Surgeon: Dolores Frame, MD;  [facility];  Service: Gastroenterology;  Laterality: N/A;  1115AM, ASA 2   EYE SURGERY     cataracts removed   Foot surgeries Bilateral    hammer toes   LEFT HEART CATH AND CORONARY ANGIOGRAPHY N/A 04/26/2020   Procedure: LEFT HEART CATH AND CORONARY ANGIOGRAPHY;  Surgeon: [place], Peter M, MD;  Location: [facility] INVASIVE CV LAB;  Service: Cardiovascular;  Laterality: N/A;   LUMBAR LAMINECTOMY/DECOMPRESSION MICRODISCECTOMY Bilateral 11/20/2022   Procedure: Lumbar Five-Sacral One Laminectomy and Foraminotomy;  Surgeon: Julio Sicks, MD;  Location: [facility] OR;  Service: Neurosurgery;  Laterality: Bilateral;  3C   MALONEY DILATION N/A 02/12/2014   Procedure: MALONEY DILATION;  Surgeon: Malissa Hippo, MD;  [facility];  Service: Endoscopy;  Laterality: N/A;   POLYPECTOMY  05/01/2019   Procedure: POLYPECTOMY;  Surgeon: Malissa Hippo, MD;  [facility];  Service: Endoscopy;;  colon    RADIAL ARTERY HARVEST Left 04/27/2020   Procedure: LEFT RADIAL ARTERY HARVEST;  Surgeon: Linden Dolin, MD;  Location: [facility];  Service:  Open Heart Surgery;  Laterality: Left;   Right knee arthroscopy     x2   TEE WITHOUT CARDIOVERSION N/A 04/27/2020   Procedure: TRANSESOPHAGEAL ECHOCARDIOGRAM (TEE);  Surgeon: Linden Dolin, MD;  Location: [facility] OR;  Service: Open Heart Surgery;  Laterality: N/A;   TONSILLECTOMY     TOTAL ABDOMINAL HYSTERECTOMY     TOTAL KNEE ARTHROPLASTY Right 04/03/2017   Procedure: RIGHT TOTAL KNEE ARTHROPLASTY;  Surgeon: Ranee Gosselin, MD;  Location: [facility];  Service: Orthopedics;  Laterality: Right;    Family History: Family History  Problem Relation Age of Onset   Diabetes Mother    Stroke Father    Diabetes Sister    Colon cancer Neg Hx    Breast cancer Neg Hx     Social History: Social History   Tobacco Use  Smoking Status Never  Smokeless Tobacco Never   Social History   Substance and Sexual Activity  Alcohol Use No   Alcohol/week: 0.0 standard drinks of alcohol   Comment: socially    Social History   Substance and Sexual Activity  Drug Use No    Allergies: Allergies  Allergen Reactions   Bee Venom Anaphylaxis   Levofloxacin Anaphylaxis and Rash   Cardizem [Diltiazem] Other (See Comments)    Patient states that she could not think, felt that she was in the twilight zone. Arm discomfort.   Sulfa Antibiotics Itching   Diazepam Nausea Only   Lipitor [Atorvastatin] Other (See Comments)    Caused her body to be "out of whack"  - elevated sugar also.    Medications: Current Outpatient Medications  Medication Sig Dispense Refill   acetaminophen (TYLENOL) 650 MG CR tablet Take 650 mg by mouth every 8 (eight) hours as needed for pain.     acyclovir (ZOVIRAX) 400 MG tablet Take 400 mg by mouth daily.     ALPRAZolam (XANAX) 0.5 MG tablet Take 0.25 mg by mouth at bedtime as needed for sleep.     aspirin 81 MG EC tablet Take 1 tablet (81 mg total) by mouth daily. Swallow whole. 30 tablet 11   Biotin w/ Vitamins C & E (HAIR  SKIN & NAILS GUMMIES PO) Take 2 capsules by mouth  daily.     cyanocobalamin (VITAMIN B12) 1000 MCG/ML injection Inject 1,000 mcg into the muscle every 30 (thirty) days.     cyclobenzaprine (FLEXERIL) 10 MG tablet Take 1 tablet (10 mg total) by mouth 3 (three) times daily as needed for muscle spasms. 30 tablet 0   empagliflozin (JARDIANCE) 25 MG TABS tablet TAKE 1 TABLET BY MOUTH DAILY BEFORE BREAKFAST. 90 tablet 3   ezetimibe (ZETIA) 10 MG tablet TAKE 1 TABLET ONCE DAILY. 30 tablet 9   HYDROcodone-acetaminophen (NORCO/VICODIN) 5-325 MG tablet Take 1 tablet by mouth every 4 (four) hours as needed for moderate pain ((score 4 to 6)). 30 tablet 0   levocetirizine (XYZAL) 5 MG tablet Take 5 mg by mouth every evening.     Menthol-Methyl Salicylate (MUSCLE RUB EX) Apply 1 application topically daily as needed (muscle pain). [place] gel     metFORMIN (GLUCOPHAGE) 1000 MG tablet Take 1,000 mg by mouth 2 (two) times daily.     metoprolol tartrate (LOPRESSOR) 25 MG tablet TAKE (1/2) TABLET BY MOUTH 2 TIMES A DAY. 90 tablet 3   nitroGLYCERIN (NITROSTAT) 0.4 MG SL tablet Place 1 tablet (0.4 mg total) under the tongue every 5 (five) minutes as needed for chest pain. 25 tablet 3   pantoprazole (PROTONIX) 40 MG tablet Take 40 mg by mouth daily.     dicyclomine (BENTYL) 10 MG capsule Take 1 capsule (10 mg total) by mouth every 12 (twelve) hours as needed for spasms. 120 capsule 1   isosorbide mononitrate (IMDUR) 30 MG 24 hr tablet Take 1 tablet (30 mg total) by mouth daily. 30 tablet 0   oxybutynin (DITROPAN-XL) 10 MG 24 hr tablet Take 10 mg by mouth every evening. (Patient not taking: Reported on 03/28/2023)     No current facility-administered medications for this visit.    Review of Systems: GENERAL: negative for malaise, night sweats HEENT: No changes in hearing or vision, no nose bleeds or other nasal problems. NECK: Negative for lumps, goiter, pain and significant neck swelling RESPIRATORY: Negative for cough, wheezing CARDIOVASCULAR: Negative for chest  pain, leg swelling, palpitations, orthopnea GI: SEE HPI MUSCULOSKELETAL: Negative for joint pain or swelling, back pain, and muscle pain. SKIN: Negative for lesions, rash PSYCH: Negative for sleep disturbance, mood disorder and recent psychosocial stressors. HEMATOLOGY Negative for prolonged bleeding, bruising easily, and swollen nodes. ENDOCRINE: Negative for cold or heat intolerance, polyuria, polydipsia and goiter. NEURO: negative for tremor, gait imbalance, syncope and seizures. The remainder of the review of systems is noncontributory.   Physical Exam: BP (!) 115/59 (BP Location: Left Arm, Patient Position: Sitting, Cuff Size: Normal)   Pulse 64   Temp (!) 97.5 F (36.4 C) (Temporal)   Ht 5\' 3"  (1.6 m)   Wt 132 lb (59.9 kg)   BMI 23.38 kg/m  GENERAL: The patient is AO x3, in no acute distress. HEENT: Head is normocephalic and atraumatic. EOMI are intact. Mouth is well hydrated and without lesions. NECK: Supple. No masses LUNGS: Clear to auscultation. No presence of rhonchi/wheezing/rales. Adequate chest expansion HEART: RRR, normal s1 and s2. ABDOMEN: mildly tender upon palpation of the abdomen diffusely, no guarding, no peritoneal signs mildly distended. BS +. No masses. EXTREMITIES: Without any cyanosis, clubbing, rash, lesions or edema. NEUROLOGIC: AOx3, no focal motor deficit. SKIN: no jaundice, no rashes  Imaging/Labs: as above  I personally reviewed and interpreted the available labs, imaging and endoscopic files.  Impression and  Plan: Katrina Castro is a 77 y.o. female with past medical history of  c. Difficile, coronary artery disease status post CABG, hypertension, GERD, type III achalasia, type 2 diabetes and depression, who presents for evaluation of diarrhea and bloating.  Patient has presented acute onset of symptoms.  Diarrhea resolved after she started taking Imodium but has presented persistent bloating and abdominal discomfort since her symptoms started.   It is not clear if this could have been related to an infectious etiology/gastroenteritis that is slowly improving.  I explained that checking her stool will have a low yield as her diarrhea has resolved now.  For now, we will give her an empiric course of Flagyl for a week and she was given a prescription for Bentyl as needed for abdominal pain episodes.  If she were to have persistent abdominal complaints with abdominal distention after finishing antibiotic course, we will check for SIBO.  -Start 1 week course of metronidazole -Start Bentyl 1 tablet q12h as needed for abdominal pain -If presenting persisting bloating, will consider SIBO breath test -Minimize the use of hydrocodone  All questions were answered.      [name], MD Gastroenterology and Hepatology [facility] Gastroenterology

## 2023-03-28 NOTE — Patient Instructions (Addendum)
Start 1 week course of metronidazole Start Bentyl 1 tablet q12h as needed for abdominal pain If presenting persisting bloating, will consider SIBO breath test Minimize the use of hydrocodone

## 2023-03-29 DIAGNOSIS — R140 Abdominal distension (gaseous): Secondary | ICD-10-CM | POA: Insufficient documentation

## 2023-03-29 DIAGNOSIS — R197 Diarrhea, unspecified: Secondary | ICD-10-CM | POA: Insufficient documentation

## 2023-04-03 DIAGNOSIS — M7138 Other bursal cyst, other site: Secondary | ICD-10-CM | POA: Diagnosis not present

## 2023-04-03 DIAGNOSIS — M545 Low back pain, unspecified: Secondary | ICD-10-CM | POA: Diagnosis not present

## 2023-04-03 DIAGNOSIS — M6281 Muscle weakness (generalized): Secondary | ICD-10-CM | POA: Diagnosis not present

## 2023-04-03 DIAGNOSIS — Z48811 Encounter for surgical aftercare following surgery on the nervous system: Secondary | ICD-10-CM | POA: Diagnosis not present

## 2023-04-11 DIAGNOSIS — I10 Essential (primary) hypertension: Secondary | ICD-10-CM | POA: Diagnosis not present

## 2023-04-11 DIAGNOSIS — Z299 Encounter for prophylactic measures, unspecified: Secondary | ICD-10-CM | POA: Diagnosis not present

## 2023-04-11 DIAGNOSIS — E538 Deficiency of other specified B group vitamins: Secondary | ICD-10-CM | POA: Diagnosis not present

## 2023-04-15 NOTE — Progress Notes (Unsigned)
Cardiology Office Note   Date:  04/17/2023   ID:  Katrina, Castro 1945-11-28, MRN [id number]  PCP:  Ignatius Specking, MD  Cardiologist:   Dietrich Pates, MD    F/U of CAD    History of Present Illness: Katrina Castro is a 77 y.o. female with a history of CAD (s/p CABG in Nov 2021 (LIMA to LAD; SVG to PDA, PLSA; L radial to RI, OM1), HTN, HL and Type II DM   Post CABG had post op afib, UTI, diarrhea.   The pt has also had chronic chest pains     I saw the pain April 2024   The pt was seen in ER on 03/18/23 for CP Was in bed when started   Felt like something sitting on chest   Never had before    Pt had stress test on 10.8/24  No ischemia    No infarct  LVEF 795  Since seen she has had no further CP   Has occsaional SOB if walks fast Some ankle swelling    Current Meds  Medication Sig   acetaminophen (TYLENOL) 650 MG CR tablet Take 650 mg by mouth every 8 (eight) hours as needed for pain.   acyclovir (ZOVIRAX) 400 MG tablet Take 400 mg by mouth daily.   ALPRAZolam (XANAX) 0.5 MG tablet Take 0.25 mg by mouth at bedtime as needed for sleep.   aspirin 81 MG EC tablet Take 1 tablet (81 mg total) by mouth daily. Swallow whole.   Biotin w/ Vitamins C & E (HAIR SKIN & NAILS GUMMIES PO) Take 2 capsules by mouth daily.   cyanocobalamin (VITAMIN B12) 1000 MCG/ML injection Inject 1,000 mcg into the muscle every 30 (thirty) days.   cyclobenzaprine (FLEXERIL) 10 MG tablet Take 1 tablet (10 mg total) by mouth 3 (three) times daily as needed for muscle spasms.   dicyclomine (BENTYL) 10 MG capsule Take 1 capsule (10 mg total) by mouth every 12 (twelve) hours as needed for spasms.   empagliflozin (JARDIANCE) 25 MG TABS tablet TAKE 1 TABLET BY MOUTH DAILY BEFORE BREAKFAST.   ezetimibe (ZETIA) 10 MG tablet TAKE 1 TABLET ONCE DAILY.   HYDROcodone-acetaminophen (NORCO/VICODIN) 5-325 MG tablet Take 1 tablet by mouth every 4 (four) hours as needed for moderate pain ((score 4 to 6)).   isosorbide  mononitrate (IMDUR) 30 MG 24 hr tablet Take 1 tablet (30 mg total) by mouth daily.   levocetirizine (XYZAL) 5 MG tablet Take 5 mg by mouth every evening.   Menthol-Methyl Salicylate (MUSCLE RUB EX) Apply 1 application topically daily as needed (muscle pain). [place] gel   metFORMIN (GLUCOPHAGE) 1000 MG tablet Take 1,000 mg by mouth 2 (two) times daily.   metoprolol tartrate (LOPRESSOR) 25 MG tablet TAKE (1/2) TABLET BY MOUTH 2 TIMES A DAY.   nitroGLYCERIN (NITROSTAT) 0.4 MG SL tablet Place 1 tablet (0.4 mg total) under the tongue every 5 (five) minutes as needed for chest pain.   oxybutynin (DITROPAN-XL) 10 MG 24 hr tablet Take 10 mg by mouth every evening.   pantoprazole (PROTONIX) 40 MG tablet Take 40 mg by mouth daily.   rosuvastatin (CRESTOR) 40 MG tablet Take 40 mg by mouth daily.     Allergies:   Bee venom, Levofloxacin, Cardizem [diltiazem], Sulfa antibiotics, Diazepam, and Lipitor [atorvastatin]   Past Medical History:  Diagnosis Date   Arthritis    CAD (coronary artery disease)    a. s/p CABG on 04/27/2020 with LIMA-LAD, seq SVG-PDA-PL,  seq left radial-RI-OM   Concussion    2015   Depression    Diabetes (HCC)    type 2   Dysphagia    Dysrhythmia    afib   GERD (gastroesophageal reflux disease)    Headache    History of bronchitis    Hypertension    Seasonal allergies    Toenail fungus     Past Surgical History:  Procedure Laterality Date   APPENDECTOMY     BACK SURGERY     x2   BOTOX INJECTION N/A 06/06/2016   Procedure: BOTOX INJECTION;  Surgeon: Malissa Hippo, MD;  [facility];  Service: Endoscopy;  Laterality: N/A;   BOTOX INJECTION N/A 01/17/2021   Procedure: BOTOX INJECTION;  Surgeon: Dolores Frame, MD;  [facility];  Service: Gastroenterology;  Laterality: N/A;   BOTOX INJECTION N/A 07/11/2021   Procedure: BOTOX INJECTION;  Surgeon: Dolores Frame, MD;  [facility];  Service: Gastroenterology;   Laterality: N/A;   BOTOX INJECTION N/A 08/21/2022   Procedure: BOTOX INJECTION;  Surgeon: Dolores Frame, MD;  [facility];  Service: Gastroenterology;  Laterality: N/A;   CHOLECYSTECTOMY     COLONOSCOPY N/A 11/05/2012   Procedure: COLONOSCOPY;  Surgeon: Malissa Hippo, MD;  [facility];  Service: Endoscopy;  Laterality: N/A;  1030   COLONOSCOPY WITH PROPOFOL N/A 05/01/2019   Procedure: COLONOSCOPY WITH PROPOFOL;  Surgeon: Malissa Hippo, MD;  [facility];  Service: Endoscopy;  Laterality: N/A;  11:20am   CORONARY ARTERY BYPASS GRAFT N/A 04/27/2020   Procedure: CORONARY ARTERY BYPASS GRAFTING (CABG) TIMES FIVE USING LEFT INTERNAL MAMMARY ARTERY, LEFT HARVESTED RADIAL ARTERY, RIGHT GREATER SAPHENOUS VEIN HARVESTED ENDOSCOPICALLY.;  Surgeon: Linden Dolin, MD;  [facility];  Service: Open Heart Surgery;  Laterality: N/A;   ESOPHAGEAL DILATION N/A 07/06/2015   Procedure: ESOPHAGEAL DILATION;  Surgeon: Malissa Hippo, MD;  [facility];  Service: Endoscopy;  Laterality: N/A;   ESOPHAGEAL DILATION N/A 06/06/2016   Procedure: ESOPHAGEAL DILATION;  Surgeon: Malissa Hippo, MD;  [facility];  Service: Endoscopy;  Laterality: N/A;   ESOPHAGEAL DILATION N/A 08/21/2022   Procedure: ESOPHAGEAL DILATION;  Surgeon: Dolores Frame, MD;  [facility];  Service: Gastroenterology;  Laterality: N/A;   ESOPHAGOGASTRODUODENOSCOPY N/A 02/12/2014   Procedure: ESOPHAGOGASTRODUODENOSCOPY (EGD);  Surgeon: Malissa Hippo, MD;  [facility];  Service: Endoscopy;  Laterality: N/A;  150   ESOPHAGOGASTRODUODENOSCOPY N/A 07/06/2015   Procedure: ESOPHAGOGASTRODUODENOSCOPY (EGD);  Surgeon: Malissa Hippo, MD;  [facility];  Service: Endoscopy;  Laterality: N/A;  1:25 - moved to 1/18 @ 10:30 - Ann to notify pt   ESOPHAGOGASTRODUODENOSCOPY (EGD) WITH ESOPHAGEAL DILATION N/A 07/25/2012   Procedure:  ESOPHAGOGASTRODUODENOSCOPY (EGD) WITH ESOPHAGEAL DILATION;  Surgeon: Malissa Hippo, MD;  [facility];  Service: Endoscopy;  Laterality: N/A;  325-rescheduled to 855 Ann notified pt   ESOPHAGOGASTRODUODENOSCOPY (EGD) WITH PROPOFOL N/A 06/06/2016   Procedure: ESOPHAGOGASTRODUODENOSCOPY (EGD) WITH PROPOFOL;  Surgeon: Malissa Hippo, MD;  [facility];  Service: Endoscopy;  Laterality: N/A;   ESOPHAGOGASTRODUODENOSCOPY (EGD) WITH PROPOFOL N/A 01/17/2021   Procedure: ESOPHAGOGASTRODUODENOSCOPY (EGD) WITH PROPOFOL;  Surgeon: Dolores Frame, MD;  [facility];  Service: Gastroenterology;  Laterality: N/A;  10:35   ESOPHAGOGASTRODUODENOSCOPY (EGD) WITH PROPOFOL N/A 07/11/2021   Procedure: ESOPHAGOGASTRODUODENOSCOPY (EGD) WITH PROPOFOL;  Surgeon: Dolores Frame, MD;  [facility];  Service: Gastroenterology;  Laterality: N/A;  11:15   ESOPHAGOGASTRODUODENOSCOPY (EGD) WITH PROPOFOL N/A 08/21/2022   Procedure: ESOPHAGOGASTRODUODENOSCOPY (EGD) WITH PROPOFOL;  Surgeon: Dolores Frame, MD;  [facility];  Service: Gastroenterology;  Laterality: N/A;  1115AM, ASA 2   EYE SURGERY     cataracts removed   Foot surgeries Bilateral    hammer toes   LEFT HEART CATH AND CORONARY ANGIOGRAPHY N/A 04/26/2020   Procedure: LEFT HEART CATH AND CORONARY ANGIOGRAPHY;  Surgeon: [place], Peter M, MD;  Location: [facility] IN[facility]ASIVE CV LAB;  Service: Cardiovascular;  Laterality: N/A;   LUMBAR LAMINECTOMY/DECOMPRESSION MICRODISCECTOMY Bilateral 11/20/2022   Procedure: Lumbar Five-Sacral One Laminectomy and Foraminotomy;  Surgeon: Julio Sicks, MD;  Location: [facility] OR;  Service: Neurosurgery;  Laterality: Bilateral;  3C   MALONEY DILATION N/A 02/12/2014   Procedure: MALONEY DILATION;  Surgeon: Malissa Hippo, MD;  [facility];  Service: Endoscopy;  Laterality: N/A;   POLYPECTOMY  05/01/2019   Procedure: POLYPECTOMY;  Surgeon: Malissa Hippo, MD;   [facility];  Service: Endoscopy;;  colon    RADIAL ARTERY HARVEST Left 04/27/2020   Procedure: LEFT RADIAL ARTERY HARVEST;  Surgeon: Linden Dolin, MD;  [facility];  Service: Open Heart Surgery;  Laterality: Left;   Right knee arthroscopy     x2   TEE WITHOUT CARDIOVERSION N/A 04/27/2020   Procedure: TRANSESOPHAGEAL ECHOCARDIOGRAM (TEE);  Surgeon: Linden Dolin, MD;  [facility];  Service: Open Heart Surgery;  Laterality: N/A;   TONSILLECTOMY     TOTAL ABDOMINAL HYSTERECTOMY     TOTAL KNEE ARTHROPLASTY Right 04/03/2017   Procedure: RIGHT TOTAL KNEE ARTHROPLASTY;  Surgeon: Ranee Gosselin, MD;  Location: [facility];  Service: Orthopedics;  Laterality: Right;     Social History:  The patient  reports that she has never smoked. She has never used smokeless tobacco. She reports that she does not drink alcohol and does not use drugs.   Family History:  The patient's family history includes Diabetes in her mother and sister; Stroke in her father.    ROS:  Please see the history of present illness. All other systems are reviewed and  Negative to the above problem except as noted.    PHYSICAL EXAM: VS:  BP 120/62   Pulse 84   Ht 5\' 3"  (1.6 m)   Wt 131 lb (59.4 kg)   SpO2 96%   BMI 23.21 kg/m   Pt in NAD   HEENT: normal  Neck  JVP is normal   Cardiac: RRR S1, S2.  No murmurs   No LE edema Chest  Very tender on palpation of sternum  Brings on pain    Respiratory:  clear to auscultation  GI: soft, nontender, nondistended,No hepatomegaly   EKG:  EKG is not ordered today.       Echo  02/17/21   1. Left ventricular ejection fraction, by estimation, is 60 to 65%. The  left ventricle has normal function. The left ventricle has no regional  wall motion abnormalities. There is mild left ventricular hypertrophy.  Left ventricular diastolic parameters  are indeterminate.   2. Right ventricular systolic function is mildly ro moderately reduced.  The right  ventricular size is mildly enlarged. Tricuspid regurgitation  signal is inadequate for assessing PA pressure.   3. The mitral valve is grossly normal. Trivial mitral valve  regurgitation.   4. The aortic valve was not well visualized. Aortic valve regurgitation  is not visualized.   5. The inferior vena cava is normal in size with greater than 50%  respiratory variability, suggesting right atrial pressure of 3 mmHg.    Lipid Panel    Component Value Date/Time   CHOL 112 03/23/2022 1343   CHOL 217 (H) 03/31/2020 1208   TRIG 100 09/24/2022 1350   HDL 63 09/24/2022 1350   CHOLHDL 2.3 03/23/2022 1343   VLDL 28 03/23/2022 1343   LDLCALC 36 03/23/2022 1343   LDLCALC 144 (H) 03/31/2020 1208      Wt Readings from Last 3 Encounters:  04/17/23 131 lb (59.4 kg)  03/28/23 132 lb (59.9 kg)  03/18/23 128 lb (58.1 kg)      ASSESSMENT AND PLAN:  1   Chest pain  Recent episode that she went to ER for   Myoview with no ischemia   CP appears musculoskeletal  Rx tylenol, rest  2  CAD   CABG in 2021   Recent myoview without ischemia    3   HTN  BP is controlled     4  HL  LDL 44     5  DM   A1C   7.6  Cut out SSB  She eats frozen fruit sticks  Needs to stop     Follow up in 6 months     Current medicines are reviewed at length with the patient today.  The patient does not have concerns regarding medicines.  Signed, Dietrich Pates, MD  04/17/2023 3:57 PM    [facility] [address] [place], [place], [place]  [address] Phone: [phone]; Fax: [phone]

## 2023-04-16 DIAGNOSIS — Z48811 Encounter for surgical aftercare following surgery on the nervous system: Secondary | ICD-10-CM | POA: Diagnosis not present

## 2023-04-16 DIAGNOSIS — M7138 Other bursal cyst, other site: Secondary | ICD-10-CM | POA: Diagnosis not present

## 2023-04-16 DIAGNOSIS — M6281 Muscle weakness (generalized): Secondary | ICD-10-CM | POA: Diagnosis not present

## 2023-04-16 DIAGNOSIS — M545 Low back pain, unspecified: Secondary | ICD-10-CM | POA: Diagnosis not present

## 2023-04-17 ENCOUNTER — Ambulatory Visit: Payer: Medicare Other | Attending: Internal Medicine | Admitting: Internal Medicine

## 2023-04-17 ENCOUNTER — Encounter: Payer: Self-pay | Admitting: Internal Medicine

## 2023-04-17 VITALS — BP 120/62 | HR 84 | Ht 63.0 in | Wt 131.0 lb

## 2023-04-17 DIAGNOSIS — I2510 Atherosclerotic heart disease of native coronary artery without angina pectoris: Secondary | ICD-10-CM

## 2023-04-17 NOTE — Patient Instructions (Signed)
Medication Instructions:  Your physician recommends that you continue on your current medications as directed. Please refer to the Current Medication list given to you today.  *If you need a refill on your cardiac medications before your next appointment, please call your pharmacy*   Lab Work: NONE   If you have labs (blood work) drawn today and your tests are completely normal, you will receive your results only by: Morgan (if you have MyChart) OR A paper copy in the mail If you have any lab test that is abnormal or we need to change your treatment, we will call you to review the results.   Testing/Procedures: NONE    Follow-Up: At [facility], you and your health needs are our priority.  As part of our continuing mission to provide you with exceptional heart care, we have created designated Provider Care Teams.  These Care Teams include your primary Cardiologist (physician) and Advanced Practice Providers (APPs -  Physician Assistants and Nurse Practitioners) who all work together to provide you with the care you need, when you need it.  We recommend signing up for the patient portal called "MyChart".  Sign up information is provided on this After Visit Summary.  MyChart is used to connect with patients for Virtual Visits (Telemedicine).  Patients are able to view lab/test results, encounter notes, upcoming appointments, etc.  Non-urgent messages can be sent to your provider as well.   To learn more about what you can do with MyChart, go to [email or web address].    Your next appointment:   6 month(s)  Provider:   You may see Dorris Carnes, MD or one of the following Advanced Practice Providers on your designated Care Team:   Bernerd Pho, PA-C  Ermalinda Barrios, PA-C     Other Instructions Thank you for choosing [facility]!

## 2023-04-18 ENCOUNTER — Telehealth: Payer: Self-pay | Admitting: Internal Medicine

## 2023-04-18 DIAGNOSIS — E1165 Type 2 diabetes mellitus with hyperglycemia: Secondary | ICD-10-CM

## 2023-04-18 DIAGNOSIS — N3020 Other chronic cystitis without hematuria: Secondary | ICD-10-CM | POA: Diagnosis not present

## 2023-04-18 MED ORDER — FREESTYLE LIBRE 3 PLUS SENSOR MISC
1.0000 | 3 refills | Status: DC
Start: 2023-04-18 — End: 2023-07-16

## 2023-04-18 NOTE — Telephone Encounter (Signed)
Requested Prescriptions   Signed Prescriptions Disp Refills   Continuous Glucose Sensor (FREESTYLE LIBRE 3 PLUS SENSOR) MISC 6 each 3    Sig: Inject 1 Device into the skin continuous. Change every 15 days    Authorizing Provider: Carlus Pavlov    Ordering User: Windell Norfolk 3 sensors are no longer being made, Libre 2 plus has been sent.

## 2023-04-18 NOTE — Telephone Encounter (Signed)
Correction: Libre 3 Plus has been sent

## 2023-04-18 NOTE — Telephone Encounter (Addendum)
MEDICATION: FreeStyle Libre 3 Sensor  PHARMACY:    [name] - [place], [place] - [address] (Ph: [phone])    HAS THE PATIENT CONTACTED THEIR PHARMACY?  No.  Pateint states that she has never had a prescription before  IS THIS A 90 DAY SUPPLY : Yes  IS PATIENT OUT OF MEDICATION: Yes  IF NOT; HOW MUCH IS LEFT:   LAST APPOINTMENT DATE: @7 /11/  NEXT APPOINTMENT DATE:@11 /21/2024  DO WE HAVE YOUR PERMISSION TO LEAVE A DETAILED MESSAGE?: Yes  OTHER COMMENTS:    **Let patient know to contact pharmacy at the end of the day to make sure medication is ready. **  ** Please notify patient to allow 48-72 hours to process**  **Encourage patient to contact the pharmacy for refills or they can request refills through [facility]**

## 2023-04-22 ENCOUNTER — Ambulatory Visit (INDEPENDENT_AMBULATORY_CARE_PROVIDER_SITE_OTHER): Payer: Medicare Other | Admitting: Gastroenterology

## 2023-04-22 DIAGNOSIS — Z299 Encounter for prophylactic measures, unspecified: Secondary | ICD-10-CM | POA: Diagnosis not present

## 2023-04-22 DIAGNOSIS — N390 Urinary tract infection, site not specified: Secondary | ICD-10-CM | POA: Diagnosis not present

## 2023-04-22 DIAGNOSIS — I10 Essential (primary) hypertension: Secondary | ICD-10-CM | POA: Diagnosis not present

## 2023-04-23 ENCOUNTER — Telehealth: Payer: Self-pay

## 2023-04-23 ENCOUNTER — Other Ambulatory Visit: Payer: Self-pay | Admitting: Internal Medicine

## 2023-04-23 NOTE — Telephone Encounter (Signed)
Transition Care Management Unsuccessful Follow-up Telephone Call  Date of discharge and from where:  Katrina Castro 9/30  Attempts:  1st Attempt  Reason for unsuccessful TCM follow-up call:  No answer/busy   Lenard Forth [place]  [facility], [facility] Guide, Phone: [phone] Website: [email or web address]

## 2023-04-24 ENCOUNTER — Telehealth: Payer: Self-pay

## 2023-04-24 NOTE — Telephone Encounter (Signed)
Transition Care Management Unsuccessful Follow-up Telephone Call  Date of discharge and from where:  Katrina Castro   Attempts:  2nd Attempt  Reason for unsuccessful TCM follow-up call:  No answer/busy   [facility]  [facility], [facility] Guide, Phone: [phone] Website: [email or web address]

## 2023-04-25 DIAGNOSIS — E1159 Type 2 diabetes mellitus with other circulatory complications: Secondary | ICD-10-CM | POA: Diagnosis not present

## 2023-04-29 DIAGNOSIS — R300 Dysuria: Secondary | ICD-10-CM | POA: Diagnosis not present

## 2023-04-29 DIAGNOSIS — I10 Essential (primary) hypertension: Secondary | ICD-10-CM | POA: Diagnosis not present

## 2023-04-29 DIAGNOSIS — I25119 Atherosclerotic heart disease of native coronary artery with unspecified angina pectoris: Secondary | ICD-10-CM | POA: Diagnosis not present

## 2023-04-29 DIAGNOSIS — R52 Pain, unspecified: Secondary | ICD-10-CM | POA: Diagnosis not present

## 2023-04-29 DIAGNOSIS — Z299 Encounter for prophylactic measures, unspecified: Secondary | ICD-10-CM | POA: Diagnosis not present

## 2023-04-29 DIAGNOSIS — I4891 Unspecified atrial fibrillation: Secondary | ICD-10-CM | POA: Diagnosis not present

## 2023-05-01 DIAGNOSIS — Z299 Encounter for prophylactic measures, unspecified: Secondary | ICD-10-CM | POA: Diagnosis not present

## 2023-05-01 DIAGNOSIS — M810 Age-related osteoporosis without current pathological fracture: Secondary | ICD-10-CM | POA: Diagnosis not present

## 2023-05-01 DIAGNOSIS — Z0000 Encounter for general adult medical examination without abnormal findings: Secondary | ICD-10-CM | POA: Diagnosis not present

## 2023-05-01 DIAGNOSIS — Z79899 Other long term (current) drug therapy: Secondary | ICD-10-CM | POA: Diagnosis not present

## 2023-05-01 DIAGNOSIS — Z7189 Other specified counseling: Secondary | ICD-10-CM | POA: Diagnosis not present

## 2023-05-01 DIAGNOSIS — R5383 Other fatigue: Secondary | ICD-10-CM | POA: Diagnosis not present

## 2023-05-01 DIAGNOSIS — I10 Essential (primary) hypertension: Secondary | ICD-10-CM | POA: Diagnosis not present

## 2023-05-09 ENCOUNTER — Ambulatory Visit: Payer: Medicare Other | Admitting: Internal Medicine

## 2023-05-09 ENCOUNTER — Telehealth: Payer: Self-pay

## 2023-05-09 ENCOUNTER — Encounter: Payer: Self-pay | Admitting: Internal Medicine

## 2023-05-09 VITALS — BP 146/70 | HR 72 | Ht 63.0 in | Wt 130.0 lb

## 2023-05-09 DIAGNOSIS — E785 Hyperlipidemia, unspecified: Secondary | ICD-10-CM | POA: Diagnosis not present

## 2023-05-09 DIAGNOSIS — E1165 Type 2 diabetes mellitus with hyperglycemia: Secondary | ICD-10-CM

## 2023-05-09 DIAGNOSIS — Z7984 Long term (current) use of oral hypoglycemic drugs: Secondary | ICD-10-CM | POA: Diagnosis not present

## 2023-05-09 DIAGNOSIS — E1159 Type 2 diabetes mellitus with other circulatory complications: Secondary | ICD-10-CM

## 2023-05-09 LAB — POCT GLYCOSYLATED HEMOGLOBIN (HGB A1C): Hemoglobin A1C: 6.6 % — AB (ref 4.0–5.6)

## 2023-05-09 MED ORDER — METFORMIN HCL 1000 MG PO TABS: 1000.0000 mg | ORAL_TABLET | Freq: Every day | ORAL | 3 refills | Status: DC

## 2023-05-09 NOTE — Progress Notes (Signed)
Patient ID: Katrina Castro, female   DOB: April 28, 1946, 77 y.o.   MRN: [id number]  HPI: Katrina Castro is a 77 y.o.-year-old female, returning for follow-up for DM2, dx in 2015, noninsulin-dependent, uncontrolled, with complications (CAD, s/p CABG; PN). Pt. previously saw Dr. Everardo All.  Last visit with me 4 months ago.  Interim history: She has blurry vision. She had a severe UTI recently after "bladder stretching" 4 weeks ago>> was on Cefuroxine, then on Diflucan after a Urine culture grew yeast reportedly. She has pelvic fulness and dysuria. She also feels dizzy.  She feels that the sugars have been higher.  Reviewed HbA1c: 02/15/2023: HbA1c 6.4% 12/27/2022: HbA1c 6.7% Lab Results  Component Value Date   HGBA1C 7.6 (A) 08/02/2022   HGBA1C 7.8 (H) 03/23/2022   HGBA1C 7.0 (A) 11/17/2021   HGBA1C 7.3 (A) 09/20/2021   HGBA1C 8.0 (A) 07/26/2021   HGBA1C 6.4 (H) 05/12/2020   HGBA1C 7.5 (H) 04/25/2020   HGBA1C 7.5 (H) 04/25/2020   Pt is on a regimen of: - Metformin ER 500 mg 2x daily >> Metformin 1000 mg in am - actually taking it 2x a day - Glipizide ER 2.5 mg before breakfast (she was apparently taking 3 tablets a day 2 in am and 1 in pm is 08/2022 at the advice of another provider) >> now once a day >> off apparently - Jardiance 25 mg daily  - Lantus 6 units daily in am >> inconsistently >> stopped 12/2022 She tried Rybelsus >> nausea, but retrospectively, this was 2/2 Esophageal pbs. She did not qualify for the patient assistance program for [facility] before.  Pt checks her sugars >4x a day:  Previously:   Lowest sugar was 120 >> 110 >> 50-60s after surgery -and few days ago, as she is not eating well; she has hypoglycemia awareness at 70.  Highest sugar was 300 (URI-antibiotics) >> 200s >> 300s.  Glucometer:Prodigy  - no CKD, last BUN/creatinine:  Lab Results  Component Value Date   BUN 12 03/18/2023   BUN 18 11/20/2022   CREATININE 0.60 03/18/2023   CREATININE 0.73  11/20/2022   No results found for: "MICRALBCREAT" She is not on ACE inhibitor or ARB.  -+ HL; last set of lipids: 02/20/2023: 192/96/41/133 09/24/2022: 125/100/63/44 Lab Results  Component Value Date   CHOL 112 03/23/2022   HDL 63 09/24/2022   LDLCALC 36 03/23/2022   TRIG 100 09/24/2022   CHOLHDL 2.3 03/23/2022  On Crestor 40, Zetia 10.  - last eye exam was in 09/2021. No DR - reportedly.   - no numbness and tingling in her feet.  She has a history of left foot surgery.  Last foot exam 01/2023 by Dr. Lilian Kapur with podiatry.  She also has a history of atrial fibrillation, HTN, GERD, depression. She is on B12 supplements.  She goes to the gym - treadmill.  She was abandoned by her mother in a hotel when she was 54 weeks old. She is working part time - caregiver for cancer patient.  She used to drive a truck for many years.  ROS: + see HPI  Past Medical History:  Diagnosis Date   Arthritis    CAD (coronary artery disease)    a. s/p CABG on 04/27/2020 with LIMA-LAD, seq SVG-PDA-PL, seq left radial-RI-OM   Concussion    2015   Depression    Diabetes (HCC)    type 2   Dysphagia    Dysrhythmia    afib   GERD (gastroesophageal reflux disease)  Headache    History of bronchitis    Hypertension    Seasonal allergies    Toenail fungus    Past Surgical History:  Procedure Laterality Date   APPENDECTOMY     BACK SURGERY     x2   BOTOX INJECTION N/A 06/06/2016   Procedure: BOTOX INJECTION;  Surgeon: Malissa Hippo, MD;  [facility];  Service: Endoscopy;  Laterality: N/A;   BOTOX INJECTION N/A 01/17/2021   Procedure: BOTOX INJECTION;  Surgeon: Dolores Frame, MD;  [facility];  Service: Gastroenterology;  Laterality: N/A;   BOTOX INJECTION N/A 07/11/2021   Procedure: BOTOX INJECTION;  Surgeon: Dolores Frame, MD;  [facility];  Service: Gastroenterology;  Laterality: N/A;   BOTOX INJECTION N/A 08/21/2022   Procedure:  BOTOX INJECTION;  Surgeon: Dolores Frame, MD;  [facility];  Service: Gastroenterology;  Laterality: N/A;   CHOLECYSTECTOMY     COLONOSCOPY N/A 11/05/2012   Procedure: COLONOSCOPY;  Surgeon: Malissa Hippo, MD;  [facility];  Service: Endoscopy;  Laterality: N/A;  1030   COLONOSCOPY WITH PROPOFOL N/A 05/01/2019   Procedure: COLONOSCOPY WITH PROPOFOL;  Surgeon: Malissa Hippo, MD;  [facility];  Service: Endoscopy;  Laterality: N/A;  11:20am   CORONARY ARTERY BYPASS GRAFT N/A 04/27/2020   Procedure: CORONARY ARTERY BYPASS GRAFTING (CABG) TIMES FIVE USING LEFT INTERNAL MAMMARY ARTERY, LEFT HARVESTED RADIAL ARTERY, RIGHT GREATER SAPHENOUS VEIN HARVESTED ENDOSCOPICALLY.;  Surgeon: Linden Dolin, MD;  Location: [facility];  Service: Open Heart Surgery;  Laterality: N/A;   ESOPHAGEAL DILATION N/A 07/06/2015   Procedure: ESOPHAGEAL DILATION;  Surgeon: Malissa Hippo, MD;  [facility];  Service: Endoscopy;  Laterality: N/A;   ESOPHAGEAL DILATION N/A 06/06/2016   Procedure: ESOPHAGEAL DILATION;  Surgeon: Malissa Hippo, MD;  [facility];  Service: Endoscopy;  Laterality: N/A;   ESOPHAGEAL DILATION N/A 08/21/2022   Procedure: ESOPHAGEAL DILATION;  Surgeon: Dolores Frame, MD;  [facility];  Service: Gastroenterology;  Laterality: N/A;   ESOPHAGOGASTRODUODENOSCOPY N/A 02/12/2014   Procedure: ESOPHAGOGASTRODUODENOSCOPY (EGD);  Surgeon: Malissa Hippo, MD;  [facility];  Service: Endoscopy;  Laterality: N/A;  150   ESOPHAGOGASTRODUODENOSCOPY N/A 07/06/2015   Procedure: ESOPHAGOGASTRODUODENOSCOPY (EGD);  Surgeon: Malissa Hippo, MD;  [facility];  Service: Endoscopy;  Laterality: N/A;  1:25 - moved to 1/18 @ 10:30 - Ann to notify pt   ESOPHAGOGASTRODUODENOSCOPY (EGD) WITH ESOPHAGEAL DILATION N/A 07/25/2012   Procedure: ESOPHAGOGASTRODUODENOSCOPY (EGD) WITH ESOPHAGEAL DILATION;  Surgeon: Malissa Hippo, MD;  [facility];  Service: Endoscopy;  Laterality: N/A;  325-rescheduled to 855 Ann notified pt   ESOPHAGOGASTRODUODENOSCOPY (EGD) WITH PROPOFOL N/A 06/06/2016   Procedure: ESOPHAGOGASTRODUODENOSCOPY (EGD) WITH PROPOFOL;  Surgeon: Malissa Hippo, MD;  [facility];  Service: Endoscopy;  Laterality: N/A;   ESOPHAGOGASTRODUODENOSCOPY (EGD) WITH PROPOFOL N/A 01/17/2021   Procedure: ESOPHAGOGASTRODUODENOSCOPY (EGD) WITH PROPOFOL;  Surgeon: Dolores Frame, MD;  [facility];  Service: Gastroenterology;  Laterality: N/A;  10:35   ESOPHAGOGASTRODUODENOSCOPY (EGD) WITH PROPOFOL N/A 07/11/2021   Procedure: ESOPHAGOGASTRODUODENOSCOPY (EGD) WITH PROPOFOL;  Surgeon: Dolores Frame, MD;  [facility];  Service: Gastroenterology;  Laterality: N/A;  11:15   ESOPHAGOGASTRODUODENOSCOPY (EGD) WITH PROPOFOL N/A 08/21/2022   Procedure: ESOPHAGOGASTRODUODENOSCOPY (EGD) WITH PROPOFOL;  Surgeon: Dolores Frame, MD;  [facility];  Service: Gastroenterology;  Laterality:  N/A;  1115AM, ASA 2   EYE SURGERY     cataracts removed   Foot surgeries Bilateral    hammer toes   LEFT HEART CATH AND CORONARY ANGIOGRAPHY N/A 04/26/2020   Procedure: LEFT HEART CATH AND CORONARY ANGIOGRAPHY;  Surgeon: Swazil[place], Peter M, MD;  Location: [facility] IN[facility] CV [facility];  Service: Cardiovascular;  Laterality: N/A;   LUMBAR LAMINECTOMY/DECOMPRESSION MICRODISCECTOMY Bilateral 11/20/2022   Procedure: Lumbar Five-Sacral One Laminectomy and Foraminotomy;  Surgeon: Julio Sicks, MD;  Location: [facility] OR;  Service: Neurosurgery;  Laterality: Bilateral;  3C   MALONEY DILATION N/A 02/12/2014   Procedure: MALONEY DILATION;  Surgeon: Malissa Hippo, MD;  [facility];  Service: Endoscopy;  Laterality: N/A;   POLYPECTOMY  05/01/2019   Procedure: POLYPECTOMY;  Surgeon: Malissa Hippo, MD;  [facility];  Service: Endoscopy;;  colon    RADIAL ARTERY  HARVEST Left 04/27/2020   Procedure: LEFT RADIAL ARTERY HARVEST;  Surgeon: Linden Dolin, MD;  Location: [facility];  Service: Open Heart Surgery;  Laterality: Left;   Right knee arthroscopy     x2   TEE WITHOUT CARDIOVERSION N/A 04/27/2020   Procedure: TRANSESOPHAGEAL ECHOCARDIOGRAM (TEE);  Surgeon: Linden Dolin, MD;  Location[facility];  Service: Open Heart Surgery;  Laterality: N/A;   TONSILLECTOMY     TOTAL ABDOMINAL HYSTERECTOMY     TOTAL KNEE ARTHROPLASTY Right 04/03/2017   Procedure: RIGHT TOTAL KNEE ARTHROPLASTY;  Surgeon: Ranee Gosselin, MD;  Location: [facility];  Service: Orthopedics;  Laterality: Right;   Social History   Socioeconomic History   Marital status: Divorced    Spouse name: Not on file   Number of children: Not on file   Years of education: Not on file   Highest education level: Not on file  Occupational History   Not on file  Tobacco Use   Smoking status: Never   Smokeless tobacco: Never  Vaping Use   Vaping status: Never Used  Substance and Sexual Activity   Alcohol use: No    Alcohol/week: 0.0 standard drinks of alcohol    Comment: socially    Drug use: No   Sexual activity: Not Currently    Partners: Male    Birth control/protection: None  Other Topics Concern   Not on file  Social History Narrative   Not on file   Social Determinants of Health   Financial Resource Strain: Not on file  Food Insecurity: Not on file  Transportation Needs: Not on file  Physical Activity: Not on file  Stress: Not on file  Social Connections: Not on file  Intimate Partner Violence: Not on file   Current Outpatient Medications on File Prior to Visit  Medication Sig Dispense Refill   acetaminophen (TYLENOL) 650 MG CR tablet Take 650 mg by mouth every 8 (eight) hours as needed for pain.     acyclovir (ZOVIRAX) 400 MG tablet Take 400 mg by mouth daily.     ALPRAZolam (XANAX) 0.5 MG tablet Take 0.25 mg by mouth at bedtime as needed for sleep.     aspirin 81 MG EC  tablet Take 1 tablet (81 mg total) by mouth daily. Swallow whole. 30 tablet 11   Biotin w/ Vitamins C & E (HAIR SKIN & NAILS GUMMIES PO) Take 2 capsules by mouth daily.     Continuous Glucose Sensor (FREESTYLE LIBRE 3 PLUS SENSOR) MISC Inject 1 Device into the skin continuous. Change every 15 days 6 each 3   cyanocobalamin (VITAMIN  B12) 1000 MCG/ML injection Inject 1,000 mcg into the muscle every 30 (thirty) days.     cyclobenzaprine (FLEXERIL) 10 MG tablet Take 1 tablet (10 mg total) by mouth 3 (three) times daily as needed for muscle spasms. 30 tablet 0   dicyclomine (BENTYL) 10 MG capsule Take 1 capsule (10 mg total) by mouth every 12 (twelve) hours as needed for spasms. 120 capsule 1   empagliflozin (JARDIANCE) 25 MG TABS tablet TAKE 1 TABLET BY MOUTH DAILY BEFORE BREAKFAST. 90 tablet 3   ezetimibe (ZETIA) 10 MG tablet TAKE 1 TABLET ONCE DAILY. 30 tablet 9   HYDROcodone-acetaminophen (NORCO/VICODIN) 5-325 MG tablet Take 1 tablet by mouth every 4 (four) hours as needed for moderate pain ((score 4 to 6)). 30 tablet 0   isosorbide mononitrate (IMDUR) 30 MG 24 hr tablet take 1 tablet (30 MILLIGRAM total) by mouth daily. 90 tablet 1   levocetirizine (XYZAL) 5 MG tablet Take 5 mg by mouth every evening.     Menthol-Methyl Salicylate (MUSCLE RUB EX) Apply 1 application topically daily as needed (muscle pain). [place] gel     metFORMIN (GLUCOPHAGE) 1000 MG tablet Take 1,000 mg by mouth 2 (two) times daily.     metoprolol tartrate (LOPRESSOR) 25 MG tablet TAKE (1/2) TABLET BY MOUTH 2 TIMES A DAY. 90 tablet 3   nitroGLYCERIN (NITROSTAT) 0.4 MG SL tablet Place 1 tablet (0.4 mg total) under the tongue every 5 (five) minutes as needed for chest pain. 25 tablet 3   oxybutynin (DITROPAN-XL) 10 MG 24 hr tablet Take 10 mg by mouth every evening.     pantoprazole (PROTONIX) 40 MG tablet Take 40 mg by mouth daily.     rosuvastatin (CRESTOR) 40 MG tablet Take 40 mg by mouth daily.     No current  facility-administered medications on file prior to visit.   Allergies  Allergen Reactions   Bee Venom Anaphylaxis   Levofloxacin Anaphylaxis and Rash   Cardizem [Diltiazem] Other (See Comments)    Patient states that she could not think, felt that she was in the twilight zone. Arm discomfort.   Sulfa Antibiotics Itching   Diazepam Nausea Only   Lipitor [Atorvastatin] Other (See Comments)    Caused her body to be "out of whack"  - elevated sugar also.   Family History  Problem Relation Age of Onset   Diabetes Mother    Stroke Father    Diabetes Sister    Colon cancer Neg Hx    Breast cancer Neg Hx    PE: BP (!) 146/70   Pulse 72   Ht 5\' 3"  (1.6 m)   Wt 130 lb (59 kg)   SpO2 99%   BMI 23.03 kg/m  Wt Readings from Last 3 Encounters:  05/09/23 130 lb (59 kg)  04/17/23 131 lb (59.4 kg)  03/28/23 132 lb (59.9 kg)   Constitutional: normal weight, in NAD Eyes: EOMI, no exophthalmos ENT: no thyromegaly, no cervical lymphadenopathy Cardiovascular: RRR, No MRG Respiratory: CTA B Musculoskeletal: no deformities Skin: no rashes Neurological: + tremor with outstretched hands  ASSESSMENT: 1. DM2, insulin-dependent, uncontrolled, with complications - CAD, s/p CABG 2021 - cardiologist: Dr. Tenny Craw - PN  2. HL  PLAN:  1. Patient with longstanding, uncontrolled, type 2 diabetes, on oral antidiabetic regimen with metformin and SGLT2 inhibitor and previously on low-dose long-acting insulin, which we stopped at last visit.  At that time, she was only taking Lantus "as needed" only when the sugars were high at bedtime and  we discussed that this was not a prn medication.  Sugars were fluctuating mostly within the target range with occasional hyperglycemic spikes after breakfast only.  Around that time and overnight, they were fluctuating in the lower half of the target range and even occasionally down to the 60s or upper 50s so I advised her to stop Lantus completely.  We continued the rest  of the regimen.  HbA1c at that time was 6.7%, lower.  However, she had an even lower HbA1c, of 6.4% 2.5 months ago. CGM interpretation: -At today's visit, we reviewed her CGM downloads: It appears that 87% of values are in target range (goal >70%), while 7% are higher than 180 (goal <25%), and 6% are lower than 70 (goal <4%).  The calculated average blood sugar is 121.  The projected HbA1c for the next 3 months (GMI) is 6.2%. -Reviewing the CGM trends, sugars are now dropping even more overnight, even <70 and they increase after b'fast but stay stable afterwards. The majority of blood sugars are still within the target range. -At today's visit, reviewing her medication list, she is actually getting a double dose of metformin, 1000 mg twice a day, rather than only taking it in the morning >> states she does have low blood sugars in the morning, with metformin, only 1 tablet with breakfast.  Also, reviewing her medication list, she is not taking glipizide.  She is not sure about when this was stopped as she gets the medications prepackaged from the pharmacy.  We will not add it back due to the lows at night.  In fact, at today's visit, I advised her to just continue Jardiance.  She did take few units of Lantus several days ago due to a high blood sugar in the 300s but I advised her to try to stay off the insulin if possible. -She recently had an yeast infection which was treated with Diflucan.  She still has some symptoms and I advised her to check back with PCP or urology about this.  If this is recurrent, we may need to stop Jardiance. - I suggested to:  Patient Instructions  Please decrease: - Metformin to 1000 mg in am  Please continue: - Jardiance 25 mg before breakfast  Make sure you are not taking Glipizide.  Stay off insulin.  Please return in 3 months.   - we checked her HbA1c: 6.6% (slightly higher) - advised to check sugars at different times of the day - 4x a day, rotating check  times - advised for yearly eye exams >> she is due - return to clinic in 4 months  2. HL -her lipid was reviewed from 09/24/2022: all fractions at goal, however, she had another lipid panel on 02/20/2023 and the LDL was much higher, increased from 44 >> 133.  She is not sure why the LDL increased this much.  States she gets the medications prepackaged, it is unclear if she is taking her medications right now. -On Crestor 40 mg daily and Zetia 10 mg daily without side effects -I advised her to check at home and see if she takes these daily  Carlus Pavlov, MD PhD [facility]

## 2023-05-09 NOTE — Patient Instructions (Addendum)
Please decrease: - Metformin to 1000 mg in am  Please continue: - Jardiance 25 mg before breakfast  Make sure you are not taking Glipizide.  Stay off insulin.  Please return in 3 months.

## 2023-05-09 NOTE — Telephone Encounter (Signed)
Sample  Medication:Jardiance  Dose: 25 mg Quantity:4 boxes ZOX:[id number]   [id number] (2 boxes) EXP:11/2024     04/2025  (2 boxes)  [name]    Provided in office

## 2023-05-13 DIAGNOSIS — R52 Pain, unspecified: Secondary | ICD-10-CM | POA: Diagnosis not present

## 2023-05-13 DIAGNOSIS — N368 Other specified disorders of urethra: Secondary | ICD-10-CM | POA: Diagnosis not present

## 2023-05-13 DIAGNOSIS — I4891 Unspecified atrial fibrillation: Secondary | ICD-10-CM | POA: Diagnosis not present

## 2023-05-13 DIAGNOSIS — Z299 Encounter for prophylactic measures, unspecified: Secondary | ICD-10-CM | POA: Diagnosis not present

## 2023-05-13 DIAGNOSIS — I10 Essential (primary) hypertension: Secondary | ICD-10-CM | POA: Diagnosis not present

## 2023-05-13 DIAGNOSIS — R350 Frequency of micturition: Secondary | ICD-10-CM | POA: Diagnosis not present

## 2023-05-20 ENCOUNTER — Ambulatory Visit (INDEPENDENT_AMBULATORY_CARE_PROVIDER_SITE_OTHER): Payer: Medicare Other | Admitting: Gastroenterology

## 2023-05-29 DIAGNOSIS — I10 Essential (primary) hypertension: Secondary | ICD-10-CM | POA: Diagnosis not present

## 2023-05-29 DIAGNOSIS — Z299 Encounter for prophylactic measures, unspecified: Secondary | ICD-10-CM | POA: Diagnosis not present

## 2023-05-29 DIAGNOSIS — I25119 Atherosclerotic heart disease of native coronary artery with unspecified angina pectoris: Secondary | ICD-10-CM | POA: Diagnosis not present

## 2023-05-29 DIAGNOSIS — N368 Other specified disorders of urethra: Secondary | ICD-10-CM | POA: Diagnosis not present

## 2023-05-29 DIAGNOSIS — I4891 Unspecified atrial fibrillation: Secondary | ICD-10-CM | POA: Diagnosis not present

## 2023-06-04 DIAGNOSIS — I25119 Atherosclerotic heart disease of native coronary artery with unspecified angina pectoris: Secondary | ICD-10-CM | POA: Diagnosis not present

## 2023-06-04 DIAGNOSIS — I10 Essential (primary) hypertension: Secondary | ICD-10-CM | POA: Diagnosis not present

## 2023-06-04 DIAGNOSIS — I4891 Unspecified atrial fibrillation: Secondary | ICD-10-CM | POA: Diagnosis not present

## 2023-06-04 DIAGNOSIS — Z299 Encounter for prophylactic measures, unspecified: Secondary | ICD-10-CM | POA: Diagnosis not present

## 2023-06-04 DIAGNOSIS — M7918 Myalgia, other site: Secondary | ICD-10-CM | POA: Diagnosis not present

## 2023-06-06 DIAGNOSIS — M7918 Myalgia, other site: Secondary | ICD-10-CM | POA: Diagnosis not present

## 2023-06-10 DIAGNOSIS — M7918 Myalgia, other site: Secondary | ICD-10-CM | POA: Diagnosis not present

## 2023-06-14 ENCOUNTER — Telehealth: Payer: Self-pay

## 2023-06-14 NOTE — Telephone Encounter (Signed)
Patient called and left a vm stating: She has been having some terrible headaches, and having some hyperglycemia  I called and spoke with the patient. She has been taking all oral medications as prescribed. Her highest blood sugar was over 350. She also mentioned that she was given prednisone. She also states that she has been urinating a lot. Her current blood sugar is 141.   Please Advise,

## 2023-06-18 MED ORDER — GLIPIZIDE 5 MG PO TABS: 5.0000 mg | ORAL_TABLET | Freq: Two times a day (BID) | ORAL | 1 refills | Status: DC

## 2023-06-18 NOTE — Telephone Encounter (Signed)
Pt has been notified and voices understanding. Rx has been sent.

## 2023-06-20 DIAGNOSIS — E538 Deficiency of other specified B group vitamins: Secondary | ICD-10-CM | POA: Diagnosis not present

## 2023-06-26 DIAGNOSIS — I25119 Atherosclerotic heart disease of native coronary artery with unspecified angina pectoris: Secondary | ICD-10-CM | POA: Diagnosis not present

## 2023-06-26 DIAGNOSIS — I10 Essential (primary) hypertension: Secondary | ICD-10-CM | POA: Diagnosis not present

## 2023-06-26 DIAGNOSIS — I739 Peripheral vascular disease, unspecified: Secondary | ICD-10-CM | POA: Diagnosis not present

## 2023-06-26 DIAGNOSIS — Z299 Encounter for prophylactic measures, unspecified: Secondary | ICD-10-CM | POA: Diagnosis not present

## 2023-06-26 DIAGNOSIS — I700 Atherosclerosis of aorta: Secondary | ICD-10-CM | POA: Diagnosis not present

## 2023-07-15 ENCOUNTER — Other Ambulatory Visit (INDEPENDENT_AMBULATORY_CARE_PROVIDER_SITE_OTHER): Payer: Self-pay | Admitting: Gastroenterology

## 2023-07-15 ENCOUNTER — Telehealth (INDEPENDENT_AMBULATORY_CARE_PROVIDER_SITE_OTHER): Payer: Self-pay

## 2023-07-15 ENCOUNTER — Telehealth: Payer: Self-pay | Admitting: Internal Medicine

## 2023-07-15 DIAGNOSIS — F458 Other somatoform disorders: Secondary | ICD-10-CM

## 2023-07-15 MED ORDER — HYOSCYAMINE SULFATE 0.125 MG SL SUBL
0.1250 mg | SUBLINGUAL_TABLET | Freq: Three times a day (TID) | SUBLINGUAL | 1 refills | Status: DC | PRN
Start: 2023-07-15 — End: 2023-07-16

## 2023-07-15 NOTE — Telephone Encounter (Signed)
I sent Levsin as needed for episodes of trouble swallowing to her pharmacy, she should not take dicyclomine if taking Levsin.

## 2023-07-15 NOTE — Telephone Encounter (Signed)
Pt states that last night she was watching TV and she had chest pain left arm pain and neck pain on the left side. Denies SOB, nausea and dizziness . She that she feels as if she is swelling below her collar bone. Encouraged pt to be seen in the ER for evaluation. Pt declined and states that she does not want to go to the ER alone. Pt states that her daughter is currently at work and is unable to go with her. Please advise.

## 2023-07-15 NOTE — Telephone Encounter (Signed)
   Pt c/o of Chest Pain: STAT if active CP, including tightness, pressure, jaw pain, radiating pain to shoulder/upper arm/back, CP unrelieved by Nitro. Symptoms reported of SOB, nausea, vomiting, sweating.  1. Are you having CP right now? No chest pain. Just having pain neck, shoulder and arm pain in her left side.    2. Are you experiencing any other symptoms (ex. SOB, nausea, vomiting, sweating)? Patients states no other symptoms. States she feels like her chest is swollen.    3. Is your CP continuous or coming and going? Pain comes and goes, states when she tries to swallow she having trouble.     4. Have you taken Nitroglycerin? no   5. How long have you been experiencing CP? The neck, shoulder and arm on left side. States it feels sore.    6. If NO CP at time of call then end call with telling Pt to call back or call 911 if Chest pain returns prior to return call from triage team.

## 2023-07-15 NOTE — Telephone Encounter (Signed)
Patient called today states she has had more issues with Dysphagia while eating bread. She would like the medication you gave her before that relaxed her throat. She says she thinks it was the dicyclomine 10 mg. Please advise and if this is the medication that helps relax her throat she would like this to be sent in to [facility] in [place]. Thanks

## 2023-07-16 ENCOUNTER — Ambulatory Visit: Payer: Medicare Other | Attending: Nurse Practitioner | Admitting: Nurse Practitioner

## 2023-07-16 ENCOUNTER — Encounter: Payer: Self-pay | Admitting: Nurse Practitioner

## 2023-07-16 VITALS — BP 110/60 | HR 78 | Ht 63.0 in | Wt 130.0 lb

## 2023-07-16 DIAGNOSIS — R222 Localized swelling, mass and lump, trunk: Secondary | ICD-10-CM | POA: Diagnosis not present

## 2023-07-16 DIAGNOSIS — R0789 Other chest pain: Secondary | ICD-10-CM

## 2023-07-16 DIAGNOSIS — I25119 Atherosclerotic heart disease of native coronary artery with unspecified angina pectoris: Secondary | ICD-10-CM | POA: Diagnosis not present

## 2023-07-16 DIAGNOSIS — E785 Hyperlipidemia, unspecified: Secondary | ICD-10-CM

## 2023-07-16 DIAGNOSIS — I10 Essential (primary) hypertension: Secondary | ICD-10-CM

## 2023-07-16 DIAGNOSIS — R5383 Other fatigue: Secondary | ICD-10-CM

## 2023-07-16 DIAGNOSIS — I4891 Unspecified atrial fibrillation: Secondary | ICD-10-CM | POA: Diagnosis not present

## 2023-07-16 NOTE — Telephone Encounter (Signed)
Appt made for E.Peck for today.

## 2023-07-16 NOTE — Patient Instructions (Addendum)

## 2023-07-16 NOTE — Telephone Encounter (Signed)
I spoke with the patient and made aware Per Dr.Castaneda, I sent Levsin as needed for episodes of trouble swallowing to her pharmacy, she should not take dicyclomine if taking Levsin. Patient states understanding.

## 2023-07-16 NOTE — Progress Notes (Unsigned)
Cardiology Office Note:  .   Date: 07/16/2023 ID:  Katrina Castro, DOB [birth year], MRN [id number] PCP: Ignatius Specking, MD  [facility] Providers Cardiologist:  Dietrich Pates, MD    History of Present Illness: .   Katrina Castro is a 78 y.o. female with a PMH of CAD, s/p CABG in 2021, type 2 diabetes, hyperlipidemia, hypertension, postop A-fib after CABG, chronic chest pain, diarrhea, past history of UTI, who presents today for neck/chest pain evaluation.  Last seen by Dr. Tenny Craw on March 21, 2023.  She denied any chest pain at that time, noted occasional shortness of breath if she was walking fast.  Did note some ankle swelling.  Chest pain appears musculoskeletal in origin from previous ED visit, Myoview was reviewed that was negative for ischemia.  She contacted our office yesterday noting chest pain along with neck pain, admitted to associated shoulder and arm pain on the left side.  She also was feeling like she was having swelling below her collarbone.  She was advised to go to the ED, patient declined.  Today she presents for office evaluation.  She admits to no energy, says she is due to get a B12 injection soon. Admits to shooting back pain that goes up to her neck on left side. She also admits to atypical chest pain, difficult for her to describe. Noted to be right sided with knot she notes along right middle side of her chest. Denies any falls or trauma. Denies any shortness of breath, palpitations, syncope, presyncope, dizziness, orthopnea, PND, significant weight changes, acute bleeding, or claudication. Says she feels swollen along her upper chest/neck.  ROS: Negative.  See HPI.  Studies Reviewed: Marland Kitchen    EKG:  EKG Interpretation Date/Time:  Tuesday July 16 2023 15:05:53 EST Ventricular Rate:  81 PR Interval:  158 QRS Duration:  70 QT Interval:  398 QTC Calculation: 462 R Axis:   -17  Text Interpretation: Normal sinus rhythm Septal infarct , age undetermined When  compared with ECG of 18-Mar-2023 11:41, Premature ventricular complexes are no longer Present Vent. rate has increased BY  27 BPM Septal infarct is now Present Confirmed by Sharlene Dory [phone]) on 07/16/2023 3:11:14 PM   Lexiscan 03/2023:    Stress ECG is negative for ischemia. Rare PVCs.   LV perfusion is abnormal. There is no evidence of ischemia. There is no evidence of infarction. There is a medium sized fixed perfusion defect present in the entire inferior wall (rest images are more intensified compared to stress images) consistent with artifact, gut activity.   Left ventricular function is normal. Nuclear stress EF: 79%.   Findings are consistent with no ischemia. The study is low risk.  Echo 02/2021:  1. Left ventricular ejection fraction, by estimation, is 60 to 65%. The  left ventricle has normal function. The left ventricle has no regional  wall motion abnormalities. There is mild left ventricular hypertrophy.  Left ventricular diastolic parameters  are indeterminate.   2. Right ventricular systolic function is mildly ro moderately reduced.  The right ventricular size is mildly enlarged. Tricuspid regurgitation  signal is inadequate for assessing PA pressure.   3. The mitral valve is grossly normal. Trivial mitral valve  regurgitation.   4. The aortic valve was not well visualized. Aortic valve regurgitation  is not visualized.   5. The inferior vena cava is normal in size with greater than 50%  respiratory variability, suggesting right atrial pressure of 3 mmHg.   Comparison(s): Prior  images reviewed side by side. Images are quite  limited in comparison. RV appears more enlarged and with mildly to  moderately reduced function considering all views. Unable to estimate  RVSP.  LHC 04/2020:  Ost LM lesion is 25% stenosed. Prox LAD to Mid LAD lesion is 75% stenosed. 1st Diag lesion is 90% stenosed. Ost Cx to Prox Cx lesion is 80% stenosed. 1st Mrg lesion is 95%  stenosed. RPDA lesion is 90% stenosed. 1st RPL lesion is 90% stenosed. The left ventricular systolic function is normal. LV end diastolic pressure is normal. The left ventricular ejection fraction is 55-65% by visual estimate.   1. Severe 3 vessel obstructive CAD 2. Normal LV function 3. Normal LVEDP   Plan: would consider revascularization with CABG.    Risk Assessment/Calculations:     STOP-Bang Score:  3  {  Physical Exam:   VS:  BP 110/60   Pulse 78   Ht 5\' 3"  (1.6 m)   Wt 130 lb (59 kg)   SpO2 98%   BMI 23.03 kg/m    Wt Readings from Last 3 Encounters:  07/16/23 130 lb (59 kg)  05/09/23 130 lb (59 kg)  04/17/23 131 lb (59.4 kg)    GEN: Well nourished, well developed in no acute distress NECK: No JVD; No carotid bruits CARDIAC: S1/S2, RRR, no murmurs, rubs, gallops, small lump noted to right middle chest, difficult to palpate. RESPIRATORY:  Clear to auscultation without rales, wheezing or rhonchi ABDOMEN: Soft, non-tender, non-distended EXTREMITIES:  No edema; No deformity  MSK: Tender to palpation along left upper back near shoulder blade  ASSESSMENT AND PLAN: .    Atypical chest pain, CAD, s/p CABG Patient has history of chronic chest pain, she admits to recent atypical symptoms.  See HPI.  Lexiscan in October 2024 was low risk and reassuring.  Her EKG is also reassuring today.  She was tender to palpation along her left upper back near her shoulder blade, appears to be MSK in nature.  Recommended to follow-up with PCP regarding this.  Continue aspirin, Zetia, Imdur, Lopressor, rosuvastatin, and nitroglycerin as needed. Heart healthy diet and regular cardiovascular exercise encouraged.  Care and ED precautions discussed.  HTN Blood pressure stable. Discussed to monitor BP at home at least 2 hours after medications and sitting for 5-10 minutes.  No medication changes at this time. Heart healthy diet and regular cardiovascular exercise encouraged.   HLD LDL 44  09/2022. She is current at goal.  Continue rosuvastatin. Heart healthy diet and regular cardiovascular exercise encouraged.   Lump in chest Etiology unclear.  CT scan in 2022 did not show any acute abnormalities. No signs/symptoms of SVCS on exam. Will consult her attending cardiologist to see if CT scan should be obtained. Recommended to f/u with PCP/OBGYN for further evaluation.   Fatigue Etiology multifactorial. Says she is due for B12 injection. Recommended to f/u with PCP for further evaluation. Heart healthy diet and regular cardiovascular exercise encouraged. Stop Bang Score 3, declines sleep study evaluation.    Dispo: Follow-up with me/APP in 3 months or sooner if anything changes.   Signed, Sharlene Dory, NP

## 2023-07-19 DIAGNOSIS — E538 Deficiency of other specified B group vitamins: Secondary | ICD-10-CM | POA: Diagnosis not present

## 2023-07-23 DIAGNOSIS — Z299 Encounter for prophylactic measures, unspecified: Secondary | ICD-10-CM | POA: Diagnosis not present

## 2023-07-23 DIAGNOSIS — E1165 Type 2 diabetes mellitus with hyperglycemia: Secondary | ICD-10-CM | POA: Diagnosis not present

## 2023-07-23 DIAGNOSIS — I10 Essential (primary) hypertension: Secondary | ICD-10-CM | POA: Diagnosis not present

## 2023-07-23 DIAGNOSIS — E1140 Type 2 diabetes mellitus with diabetic neuropathy, unspecified: Secondary | ICD-10-CM | POA: Diagnosis not present

## 2023-07-23 DIAGNOSIS — I4891 Unspecified atrial fibrillation: Secondary | ICD-10-CM | POA: Diagnosis not present

## 2023-07-24 ENCOUNTER — Other Ambulatory Visit: Payer: Self-pay | Admitting: Internal Medicine

## 2023-07-26 ENCOUNTER — Telehealth: Payer: Self-pay | Admitting: Nurse Practitioner

## 2023-07-26 NOTE — Telephone Encounter (Signed)
 MyChart message sent to patient.

## 2023-07-26 NOTE — Telephone Encounter (Signed)
-----   Message from Almarie Crate sent at 07/26/2023 10:57 AM EST ----- Please tell patient that I heard back from Dr. Deward Gull, her cardiologist.  She needs to follow-up with her primary care provider regarding the lump along her right middle chest.  No further recommendations from her cardiologist.  Thanks!   Best,  Almarie Crate, NP

## 2023-07-27 DIAGNOSIS — E1159 Type 2 diabetes mellitus with other circulatory complications: Secondary | ICD-10-CM | POA: Diagnosis not present

## 2023-07-29 ENCOUNTER — Ambulatory Visit: Payer: Medicare Other | Admitting: Podiatry

## 2023-07-30 ENCOUNTER — Telehealth: Payer: Self-pay

## 2023-07-30 DIAGNOSIS — E1165 Type 2 diabetes mellitus with hyperglycemia: Secondary | ICD-10-CM

## 2023-07-30 MED ORDER — EMPAGLIFLOZIN 25 MG PO TABS: 25.0000 mg | ORAL_TABLET | Freq: Every day | ORAL | 3 refills | Status: DC

## 2023-07-30 NOTE — Telephone Encounter (Signed)
Requested Prescriptions   Signed Prescriptions Disp Refills   empagliflozin (JARDIANCE) 25 MG TABS tablet 90 tablet 3    Sig: Take 1 tablet (25 mg total) by mouth daily before breakfast.    Authorizing Provider: Carlus Pavlov    Ordering User: Pollie Meyer

## 2023-08-27 DIAGNOSIS — R5383 Other fatigue: Secondary | ICD-10-CM | POA: Diagnosis not present

## 2023-08-27 DIAGNOSIS — I700 Atherosclerosis of aorta: Secondary | ICD-10-CM | POA: Diagnosis not present

## 2023-08-27 DIAGNOSIS — I10 Essential (primary) hypertension: Secondary | ICD-10-CM | POA: Diagnosis not present

## 2023-08-27 DIAGNOSIS — Z299 Encounter for prophylactic measures, unspecified: Secondary | ICD-10-CM | POA: Diagnosis not present

## 2023-08-27 DIAGNOSIS — E538 Deficiency of other specified B group vitamins: Secondary | ICD-10-CM | POA: Diagnosis not present

## 2023-08-29 DIAGNOSIS — E538 Deficiency of other specified B group vitamins: Secondary | ICD-10-CM | POA: Diagnosis not present

## 2023-09-11 DIAGNOSIS — I10 Essential (primary) hypertension: Secondary | ICD-10-CM | POA: Diagnosis not present

## 2023-09-11 DIAGNOSIS — E538 Deficiency of other specified B group vitamins: Secondary | ICD-10-CM | POA: Diagnosis not present

## 2023-09-11 DIAGNOSIS — Z299 Encounter for prophylactic measures, unspecified: Secondary | ICD-10-CM | POA: Diagnosis not present

## 2023-09-11 DIAGNOSIS — R5383 Other fatigue: Secondary | ICD-10-CM | POA: Diagnosis not present

## 2023-09-12 ENCOUNTER — Ambulatory Visit: Payer: Medicare Other | Admitting: Internal Medicine

## 2023-09-12 DIAGNOSIS — R350 Frequency of micturition: Secondary | ICD-10-CM | POA: Diagnosis not present

## 2023-09-15 ENCOUNTER — Other Ambulatory Visit: Payer: Self-pay | Admitting: Internal Medicine

## 2023-09-23 ENCOUNTER — Telehealth (INDEPENDENT_AMBULATORY_CARE_PROVIDER_SITE_OTHER): Payer: Self-pay | Admitting: Gastroenterology

## 2023-09-23 ENCOUNTER — Telehealth (INDEPENDENT_AMBULATORY_CARE_PROVIDER_SITE_OTHER): Payer: Self-pay

## 2023-09-23 NOTE — Telephone Encounter (Signed)
 Thanks

## 2023-09-23 NOTE — Telephone Encounter (Signed)
 I spoke with the patient and made her aware per Dr. Levon Hedger,  She can try using MiraLAX 1-3 capfuls per day for management of constipation. If there are no open appointments with me or Chelsea, please check the schedule of any of the other APP's for available slots.  Patient states understanding and says she would prefer to not see anyone else other than this office.

## 2023-09-23 NOTE — Telephone Encounter (Signed)
 Pt called asking for an appointment with Dr Levon Hedger. I offered her one for today, but she said she couldn't come out due to the rain. I offered her one with Chelsea's next available in May, but she didn't want to wait that long. I offered to get her on the schedule and add her to the cancellation list, but she wasn't favorable with that. She has multiple questions about her bowels being black and being constipated after taking medicines. She asked to speak to nurse, so I transferred call to nurse.

## 2023-09-23 NOTE — Telephone Encounter (Signed)
 I spoke with the patient she says she has a burning sensation around her mid section and has had some issue with constipation. She says she had not had a Bm in a few days and was taking Stool softeners at bedtime and took two laxatives last week, she inserted a rectal suppository last night and and had a small Bm. She reports that when she had this Bm it came out dark. She says she thinks she has a hemorrhoid too. She was offered an appointment by Darl Pikes today, but patient did not want to come out into the Rain. She was told that we did not have another opening until May. She has been placed on the cancellation list. Please advise of anything she can do in the mean time. Thanks

## 2023-09-23 NOTE — Telephone Encounter (Signed)
 She can try using MiraLAX 1-3 capfuls per day for management of constipation. If there are no open appointments with me or Chelsea, please check the schedule of any of the other APP's for available slots.

## 2023-09-24 ENCOUNTER — Ambulatory Visit (HOSPITAL_COMMUNITY)
Admission: RE | Admit: 2023-09-24 | Discharge: 2023-09-24 | Disposition: A | Discharge status: Home / Self Care | Source: Ambulatory Visit | Attending: Gastroenterology | Admitting: Gastroenterology

## 2023-09-24 ENCOUNTER — Ambulatory Visit (INDEPENDENT_AMBULATORY_CARE_PROVIDER_SITE_OTHER): Admitting: Gastroenterology

## 2023-09-24 ENCOUNTER — Encounter (INDEPENDENT_AMBULATORY_CARE_PROVIDER_SITE_OTHER): Payer: Self-pay | Admitting: Gastroenterology

## 2023-09-24 VITALS — BP 117/56 | HR 72 | Temp 97.5°F | Ht 62.5 in | Wt 129.3 lb

## 2023-09-24 DIAGNOSIS — K8689 Other specified diseases of pancreas: Secondary | ICD-10-CM | POA: Diagnosis not present

## 2023-09-24 DIAGNOSIS — R10817 Generalized abdominal tenderness: Secondary | ICD-10-CM | POA: Diagnosis not present

## 2023-09-24 DIAGNOSIS — R1084 Generalized abdominal pain: Secondary | ICD-10-CM

## 2023-09-24 DIAGNOSIS — R6881 Early satiety: Secondary | ICD-10-CM

## 2023-09-24 DIAGNOSIS — K5900 Constipation, unspecified: Secondary | ICD-10-CM

## 2023-09-24 DIAGNOSIS — R110 Nausea: Secondary | ICD-10-CM

## 2023-09-24 DIAGNOSIS — D649 Anemia, unspecified: Secondary | ICD-10-CM | POA: Diagnosis not present

## 2023-09-24 DIAGNOSIS — R109 Unspecified abdominal pain: Secondary | ICD-10-CM | POA: Diagnosis not present

## 2023-09-24 DIAGNOSIS — K649 Unspecified hemorrhoids: Secondary | ICD-10-CM

## 2023-09-24 DIAGNOSIS — K921 Melena: Secondary | ICD-10-CM | POA: Diagnosis not present

## 2023-09-24 DIAGNOSIS — K449 Diaphragmatic hernia without obstruction or gangrene: Secondary | ICD-10-CM | POA: Diagnosis not present

## 2023-09-24 LAB — POCT I-STAT CREATININE: Creatinine, Ser: 0.8 mg/dL (ref 0.44–1.00)

## 2023-09-24 MED ORDER — IOHEXOL 300 MG/ML  SOLN
100.0000 mL | Freq: Once | INTRAMUSCULAR | Status: AC | PRN
  Administered 2023-09-24: 100 mL via INTRAVENOUS

## 2023-09-24 MED ORDER — HYDROCORTISONE (PERIANAL) 2.5 % EX CREA: 1.0000 | TOPICAL_CREAM | Freq: Two times a day (BID) | CUTANEOUS | 0 refills | Status: AC

## 2023-09-24 NOTE — H&P (View-Only) (Signed)
 Referring Provider: No ref. provider found Primary Care Physician:  No primary care provider on file. Primary GI Physician: Dr. Sammi Crick   Chief Complaint  Patient presents with   Abdominal Pain    Patient here today due to having issues with abdominal pain and constipation. She has seen some darker stools recently. Patient having some nausea and abdominal bloating today.   Hemorrhoids    Patient says she thinks she has a hemorrhoid as when she sits down she feels like there is something back there.    HPI:   Katrina Castro is a 78 y.o. female with past medical history of c. Difficile, coronary artery disease status post CABG, hypertension, GERD, type III achalasia, type 2 diabetes and depression   Patient presenting today for:  Abdominal pain, constipation and hemorrhoids   Last seen October 2024, at that time patient reported abdominal bloating for the past 3 weeks.  Passing gas regularly.  Having diarrhea about 2 to 3 weeks.  Taking Imodium which led to normal consistency of stools.  Patient recommended to start 1 week course of metronidazole, start Bentyl 1 tablet every 12 hours as needed, consider SIBO breath testing if bloating continues, minimize hydrocodone.  Present:  Patient here with reports of constipation. Has gone 3-4 days without a BM. Having mid, periumbilical abdominal pain, feeling full and nauseated since Sunday. Denies rectal bleeding. She endorses black stools for the past month or so, also started on iron about 1 month ago for low iron and low b12 (notably hgb was 11.3, no iron levels checked, B12 was 366).  She reports that stools were dark prior to iron pills, she is also not taking the iron daily, maybe 5 times since prescribed. She states she did some metamucil and passed a few very small, harder stools this morning, did have some larger volume stools over the weekend after doing a suppository and stool softener. She has some pain in her rectal area, thinks  these are hemorrhoids. She called yesterday and was told to try miralax but states she thought it was metamucil so that is what she took. Water intake is not good.   Recent TSH in march was WNL.   esophageal manometry-[facility] on February 2017 which showed findings suggestive of type III achalasia as her IRP was 22 with presence of a spastic esophagus. She had previously responded to the use of Botox .   Patient was referred to [facility] in 2023 and she was seen by Dr. Eliverto Gula.  She underwent a manometry that did not show any spasm or increased contractility, as well as no abnormalities in the motility per the report.  Also had a normal time barium swallow.  Symptoms were considered to be functional in nature.   Last Colonoscopy:2020 - The examined portion of the ileum was normal.                           - One small polyp in the proximal sigmoid colon.                            Biopsied-TA                            - External hemorrhoids. Last Endoscopy:08/2022- Normal esophagus. Dilated. Injected with  botulinum toxin.                           - Normal stomach.                           - Normal examined duodenum.                           - No specimens collected Filed Weights   09/24/23 1343  Weight: 129 lb 4.8 oz (58.7 kg)     Past Medical History:  Diagnosis Date   Arthritis    CAD (coronary artery disease)    a. s/p CABG on 04/27/2020 with LIMA-LAD, seq SVG-PDA-PL, seq left radial-RI-OM   Concussion    2015   Depression    Diabetes (HCC)    type 2   Dysphagia    Dysrhythmia    afib   GERD (gastroesophageal reflux disease)    Headache    History of bronchitis    Hypertension    Seasonal allergies    Toenail fungus     Past Surgical History:  Procedure Laterality Date   APPENDECTOMY     BACK SURGERY     x2   BOTOX INJECTION N/A 06/06/2016   Procedure: BOTOX INJECTION;  Surgeon: Ruby Corporal, MD;  [facility];   Service: Endoscopy;  Laterality: N/A;   BOTOX INJECTION N/A 01/17/2021   Procedure: BOTOX INJECTION;  Surgeon: Urban Garden, MD;  [facility];  Service: Gastroenterology;  Laterality: N/A;   BOTOX INJECTION N/A 07/11/2021   Procedure: BOTOX INJECTION;  Surgeon: Urban Garden, MD;  [facility];  Service: Gastroenterology;  Laterality: N/A;   BOTOX INJECTION N/A 08/21/2022   Procedure: BOTOX INJECTION;  Surgeon: Urban Garden, MD;  [facility];  Service: Gastroenterology;  Laterality: N/A;   CHOLECYSTECTOMY     COLONOSCOPY N/A 11/05/2012   Procedure: COLONOSCOPY;  Surgeon: Ruby Corporal, MD;  [facility];  Service: Endoscopy;  Laterality: N/A;  1030   COLONOSCOPY WITH PROPOFOL N/A 05/01/2019   Procedure: COLONOSCOPY WITH PROPOFOL;  Surgeon: Ruby Corporal, MD;  [facility];  Service: Endoscopy;  Laterality: N/A;  11:20am   CORONARY ARTERY BYPASS GRAFT N/A 04/27/2020   Procedure: CORONARY ARTERY BYPASS GRAFTING (CABG) TIMES FIVE USING LEFT INTERNAL MAMMARY ARTERY, LEFT HARVESTED RADIAL ARTERY, RIGHT GREATER SAPHENOUS VEIN HARVESTED ENDOSCOPICALLY.;  Surgeon: Rudine Cos, MD;  Location: [facility];  Service: Open Heart Surgery;  Laterality: N/A;   ESOPHAGEAL DILATION N/A 07/06/2015   Procedure: ESOPHAGEAL DILATION;  Surgeon: Ruby Corporal, MD;  [facility];  Service: Endoscopy;  Laterality: N/A;   ESOPHAGEAL DILATION N/A 06/06/2016   Procedure: ESOPHAGEAL DILATION;  Surgeon: Ruby Corporal, MD;  [facility];  Service: Endoscopy;  Laterality: N/A;   ESOPHAGEAL DILATION N/A 08/21/2022   Procedure: ESOPHAGEAL DILATION;  Surgeon: Urban Garden, MD;  [facility];  Service: Gastroenterology;  Laterality: N/A;   ESOPHAGOGASTRODUODENOSCOPY N/A 02/12/2014   Procedure: ESOPHAGOGASTRODUODENOSCOPY (EGD);  Surgeon: Ruby Corporal, MD;  [facility];  Service:  Endoscopy;  Laterality: N/A;  150   ESOPHAGOGASTRODUODENOSCOPY N/A 07/06/2015   Procedure: ESOPHAGOGASTRODUODENOSCOPY (EGD);  Surgeon: Ruby Corporal, MD;  [facility];  Service: Endoscopy;  Laterality: N/A;  1:25 - moved  to 1/18 @ 10:30 - Ann to notify pt   ESOPHAGOGASTRODUODENOSCOPY (EGD) WITH ESOPHAGEAL DILATION N/A 07/25/2012   Procedure: ESOPHAGOGASTRODUODENOSCOPY (EGD) WITH ESOPHAGEAL DILATION;  Surgeon: Ruby Corporal, MD;  [facility];  Service: Endoscopy;  Laterality: N/A;  325-rescheduled to 855 Ann notified pt   ESOPHAGOGASTRODUODENOSCOPY (EGD) WITH PROPOFOL N/A 06/06/2016   Procedure: ESOPHAGOGASTRODUODENOSCOPY (EGD) WITH PROPOFOL;  Surgeon: Ruby Corporal, MD;  [facility];  Service: Endoscopy;  Laterality: N/A;   ESOPHAGOGASTRODUODENOSCOPY (EGD) WITH PROPOFOL N/A 01/17/2021   Procedure: ESOPHAGOGASTRODUODENOSCOPY (EGD) WITH PROPOFOL;  Surgeon: Urban Garden, MD;  [facility];  Service: Gastroenterology;  Laterality: N/A;  10:35   ESOPHAGOGASTRODUODENOSCOPY (EGD) WITH PROPOFOL N/A 07/11/2021   Procedure: ESOPHAGOGASTRODUODENOSCOPY (EGD) WITH PROPOFOL;  Surgeon: Urban Garden, MD;  [facility];  Service: Gastroenterology;  Laterality: N/A;  11:15   ESOPHAGOGASTRODUODENOSCOPY (EGD) WITH PROPOFOL N/A 08/21/2022   Procedure: ESOPHAGOGASTRODUODENOSCOPY (EGD) WITH PROPOFOL;  Surgeon: Urban Garden, MD;  [facility];  Service: Gastroenterology;  Laterality: N/A;  1115AM, ASA 2   EYE SURGERY     cataracts removed   Foot surgeries Bilateral    hammer toes   LEFT HEART CATH AND CORONARY ANGIOGRAPHY N/A 04/26/2020   Procedure: LEFT HEART CATH AND CORONARY ANGIOGRAPHY;  Surgeon: Swazil[place], Peter M, MD;  Location: [facility] CV [facility];  Service: Cardiovascular;  Laterality: N/A;   LUMBAR LAMINECTOMY/DECOMPRESSION MICRODISCECTOMY Bilateral 11/20/2022   Procedure: Lumbar Five-Sacral One Laminectomy and  Foraminotomy;  Surgeon: Agustina Aldrich, MD;  Location: [facility];  Service: Neurosurgery;  Laterality: Bilateral;  3C   MALONEY DILATION N/A 02/12/2014   Procedure: MALONEY DILATION;  Surgeon: Ruby Corporal, MD;  [facility];  Service: Endoscopy;  Laterality: N/A;   POLYPECTOMY  05/01/2019   Procedure: POLYPECTOMY;  Surgeon: Ruby Corporal, MD;  [facility];  Service: Endoscopy;;  colon    RADIAL ARTERY HARVEST Left 04/27/2020   Procedure: LEFT RADIAL ARTERY HARVEST;  Surgeon: Rudine Cos, MD;  Location: [facility];  Service: Open Heart Surgery;  Laterality: Left;   Right knee arthroscopy     x2   TEE WITHOUT CARDIOVERSION N/A 04/27/2020   Procedure: TRANSESOPHAGEAL ECHOCARDIOGRAM (TEE);  Surgeon: Rudine Cos, MD;  Location: [facility];  Service: Open Heart Surgery;  Laterality: N/A;   TONSILLECTOMY     TOTAL ABDOMINAL HYSTERECTOMY     TOTAL KNEE ARTHROPLASTY Right 04/03/2017   Procedure: RIGHT TOTAL KNEE ARTHROPLASTY;  Surgeon: Hazle Lites, MD;  Location: [facility];  Service: Orthopedics;  Laterality: Right;    Current Outpatient Medications  Medication Sig Dispense Refill   acyclovir (ZOVIRAX) 400 MG tablet Take 400 mg by mouth daily.     aspirin 81 MG EC tablet Take 1 tablet (81 mg total) by mouth daily. Swallow whole. 30 tablet 11   empagliflozin (JARDIANCE) 25 MG TABS tablet Take 1 tablet (25 mg total) by mouth daily before breakfast. 90 tablet 3   ezetimibe (ZETIA) 10 MG tablet take 1 tablet once daily. 90 tablet 2   ferrous sulfate 324 MG TBEC Take 324 mg by mouth daily with breakfast.     glipiZIDE (GLUCOTROL) 5 MG tablet Take 1 tablet (5 mg total) by mouth 2 (two) times daily before a meal. 60 tablet 2   isosorbide mononitrate (IMDUR) 30 MG 24 hr tablet take 1 tablet (30 MILLIGRAM total) by mouth daily. 90 tablet 1   levocetirizine (XYZAL) 5 MG tablet  Take 5 mg by mouth every evening.     metFORMIN (GLUCOPHAGE) 1000 MG tablet Take 1 tablet (1,000 mg total)  by mouth daily with breakfast. 90 tablet 3   metoprolol tartrate (LOPRESSOR) 25 MG tablet take (1/2) tablet by mouth 2 times a day. 90 tablet 2   nitroGLYCERIN (NITROSTAT) 0.4 MG SL tablet Place 1 tablet (0.4 mg total) under the tongue every 5 (five) minutes as needed for chest pain. 25 tablet 3   omeprazole (PRILOSEC) 40 MG capsule Take 40 mg by mouth daily.     oxybutynin (DITROPAN-XL) 10 MG 24 hr tablet Take 10 mg by mouth every evening.     rosuvastatin (CRESTOR) 40 MG tablet take 1 tablet by mouth once daily. 90 tablet 2   No current facility-administered medications for this visit.    Allergies as of 09/24/2023 - Review Complete 09/24/2023  Allergen Reaction Noted   Bee venom Anaphylaxis 08/05/2015   Levofloxacin Anaphylaxis and Rash 04/03/2017   Cardizem [diltiazem] Other (See Comments) 08/17/2015   Sulfa antibiotics Itching 07/13/2014   Diazepam Nausea Only 04/03/2017   Lipitor [atorvastatin] Other (See Comments) 10/12/2019    Social History   Socioeconomic History   Marital status: Divorced    Spouse name: Not on file   Number of children: Not on file   Years of education: Not on file   Highest education level: Not on file  Occupational History   Not on file  Tobacco Use   Smoking status: Never   Smokeless tobacco: Never  Vaping Use   Vaping status: Never Used  Substance and Sexual Activity   Alcohol use: No    Alcohol/week: 0.0 standard drinks of alcohol    Comment: socially    Drug use: No   Sexual activity: Not Currently    Partners: Male    Birth control/protection: None  Other Topics Concern   Not on file  Social History Narrative   Not on file   Social Drivers of Health   Financial Resource Strain: Not on file  Food Insecurity: Not on file  Transportation Needs: Not on file  Physical Activity: Not on file  Stress: Not on file  Social Connections: Not on file    Review of systems General: negative for malaise, night sweats, fever, chills,  weight loss Neck: Negative for lumps, goiter, pain and significant neck swelling Resp: Negative for cough, wheezing, dyspnea at rest CV: Negative for chest pain, leg swelling, palpitations, orthopnea GI: denies melena, hematochezia, vomiting, diarrhea, dysphagia, odyonophagia, early satiety or unintentional weight loss. +nausea +fullness +early satiety +melena +constipation +rectal discomfort  MSK: Negative for joint pain or swelling, back pain, and muscle pain. Derm: Negative for itching or rash Psych: Denies depression, anxiety, memory loss, confusion. No homicidal or suicidal ideation.  Heme: Negative for prolonged bleeding, bruising easily, and swollen nodes. Endocrine: Negative for cold or heat intolerance, polyuria, polydipsia and goiter. Neuro: negative for tremor, gait imbalance, syncope and seizures. The remainder of the review of systems is noncontributory.  Physical Exam: BP (!) 117/56 (BP Location: Left Arm, Patient Position: Sitting, Cuff Size: Normal)   Pulse 72   Temp (!) 97.5 F (36.4 C) (Temporal)   Ht 5' 2.5" (1.588 m)   Wt 129 lb 4.8 oz (58.7 kg)   BMI 23.27 kg/m  General:   Alert and oriented. No distress noted. Pleasant and cooperative.  Head:  Normocephalic and atraumatic. Eyes:  Conjuctiva clear without scleral icterus. Mouth:  Oral mucosa pink and moist. Good  dentition. No lesions. Heart: Normal rate and rhythm, s1 and s2 heart sounds present.  Lungs: Clear lung sounds in all lobes. Respirations equal and unlabored. Abdomen:  +BS, soft, diffuse abdominal tenderness and non-distended. No rebound or guarding. No HSM or masses noted. Rectal: crystal sutton CMA present as witness, small, external non bleedign, non thrombosed hemorrhoid present DRE with normal sphincter tone, no obvious lesions or masses other than hemrorhoid Derm: No palmar erythema or jaundice Msk:  Symmetrical without gross deformities. Normal posture. Extremities:  Without edema. Neurologic:   Alert and  oriented x4 Psych:  Alert and cooperative. Normal mood and affect.  Invalid input(s): "6 MONTHS"   ASSESSMENT: Kosha Jaquith is a 78 y.o. female presenting today for constipation, melena and abdominal pain  Patient with reports of constipation for the past 3 to 4 days.  She has mid, periumbilical abdominal pain as well as feeling full nausea and some Sunday.  Of note she had a lot of diffuse abdominal abdominal tenderness on exam today.  Suspect abdominal pain is secondary to constipation given abnormal exam abdominal exam and recommended proceeding with CTAP with contrast at to rule out any other acute findings today.  Recommend to start MiraLAX 1 capful 3 times daily, increase water intake as well as fruits, veggies, whole grains in her diet.  Patient also reports nausea, early satiety and dark stools.  Note she was recently started on iron pills though she had dark stools prior to this.  She has never had an upper endoscopy.  Recommend proceeding with upper endoscopy for evaluation of her upper GI symptoms/melena.  I do not see any iron studies have been done in her chart, we will check H&H and iron studies today.  Would recommend updating colonoscopy as well if iron studies show IDA.  Patient also reports some rectal pain, rectal exam today with presence of hemorrhoids. Will send Anusol to be used twice daily x 10 days.  Should try to avoid straining and limit toilet time is much as possible.  Given patient's reported significant abdominal pain today in the office, I did discuss with her proceeding to the ER for more acute evaluation though at this time patient declines.  She is aware if she has rectal bleeding, vomiting that does not subside, fevers, worsening abdominal pain she needs to proceed to the ER for further evaluation.   PLAN:  -schedule EGD -check h&h and iron studies  -consider repeat colonoscopy pending iron studies  -start miralax 1 capful TID  -Increase water  intake, aim for atleast 64 oz per day -anusol BID x10 days -limit toilet time, avoid straining  -Increase fruits, veggies and whole grains, kiwi and prunes are especially good for constipation  All questions were answered, patient verbalized understanding and is in agreement with plan as outlined above.    Follow Up: 6 weeks  Katrina Mcleroy L. Adrien Alberta, MSN, APRN, AGNP-C Adult-Gerontology Nurse Practitioner [facility]  I have reviewed the note and agree with the APP's assessment as described in this progress note  Samantha Cress, MD Gastroenterology and Hepatology [facility]

## 2023-09-24 NOTE — Progress Notes (Addendum)
 Referring Provider: No ref. provider found Primary Care Physician:  No primary care provider on file. Primary GI Physician: Dr. Sammi Crick   Chief Complaint  Patient presents with   Abdominal Pain    Patient here today due to having issues with abdominal pain and constipation. She has seen some darker stools recently. Patient having some nausea and abdominal bloating today.   Hemorrhoids    Patient says she thinks she has a hemorrhoid as when she sits down she feels like there is something back there.    HPI:   Katrina Castro is a 78 y.o. female with past medical history of c. Difficile, coronary artery disease status post CABG, hypertension, GERD, type III achalasia, type 2 diabetes and depression   Patient presenting today for:  Abdominal pain, constipation and hemorrhoids   Last seen October 2024, at that time patient reported abdominal bloating for the past 3 weeks.  Passing gas regularly.  Having diarrhea about 2 to 3 weeks.  Taking Imodium which led to normal consistency of stools.  Patient recommended to start 1 week course of metronidazole, start Bentyl 1 tablet every 12 hours as needed, consider SIBO breath testing if bloating continues, minimize hydrocodone.  Present:  Patient here with reports of constipation. Has gone 3-4 days without a BM. Having mid, periumbilical abdominal pain, feeling full and nauseated since Sunday. Denies rectal bleeding. She endorses black stools for the past month or so, also started on iron about 1 month ago for low iron and low b12 (notably hgb was 11.3, no iron levels checked, B12 was 366).  She reports that stools were dark prior to iron pills, she is also not taking the iron daily, maybe 5 times since prescribed. She states she did some metamucil and passed a few very small, harder stools this morning, did have some larger volume stools over the weekend after doing a suppository and stool softener. She has some pain in her rectal area, thinks  these are hemorrhoids. She called yesterday and was told to try miralax but states she thought it was metamucil so that is what she took. Water intake is not good.   Recent TSH in march was WNL.   esophageal manometry-[facility] on February 2017 which showed findings suggestive of type III achalasia as her IRP was 22 with presence of a spastic esophagus. She had previously responded to the use of Botox .   Patient was referred to [facility] in 2023 and she was seen by Dr. Eliverto Gula.  She underwent a manometry that did not show any spasm or increased contractility, as well as no abnormalities in the motility per the report.  Also had a normal time barium swallow.  Symptoms were considered to be functional in nature.   Last Colonoscopy:2020 - The examined portion of the ileum was normal.                           - One small polyp in the proximal sigmoid colon.                            Biopsied-TA                            - External hemorrhoids. Last Endoscopy:08/2022- Normal esophagus. Dilated. Injected with  botulinum toxin.                           - Normal stomach.                           - Normal examined duodenum.                           - No specimens collected Filed Weights   09/24/23 1343  Weight: 129 lb 4.8 oz (58.7 kg)     Past Medical History:  Diagnosis Date   Arthritis    CAD (coronary artery disease)    a. s/p CABG on 04/27/2020 with LIMA-LAD, seq SVG-PDA-PL, seq left radial-RI-OM   Concussion    2015   Depression    Diabetes (HCC)    type 2   Dysphagia    Dysrhythmia    afib   GERD (gastroesophageal reflux disease)    Headache    History of bronchitis    Hypertension    Seasonal allergies    Toenail fungus     Past Surgical History:  Procedure Laterality Date   APPENDECTOMY     BACK SURGERY     x2   BOTOX INJECTION N/A 06/06/2016   Procedure: BOTOX INJECTION;  Surgeon: Ruby Corporal, MD;  [facility];   Service: Endoscopy;  Laterality: N/A;   BOTOX INJECTION N/A 01/17/2021   Procedure: BOTOX INJECTION;  Surgeon: Urban Garden, MD;  [facility];  Service: Gastroenterology;  Laterality: N/A;   BOTOX INJECTION N/A 07/11/2021   Procedure: BOTOX INJECTION;  Surgeon: Urban Garden, MD;  [facility];  Service: Gastroenterology;  Laterality: N/A;   BOTOX INJECTION N/A 08/21/2022   Procedure: BOTOX INJECTION;  Surgeon: Urban Garden, MD;  [facility];  Service: Gastroenterology;  Laterality: N/A;   CHOLECYSTECTOMY     COLONOSCOPY N/A 11/05/2012   Procedure: COLONOSCOPY;  Surgeon: Ruby Corporal, MD;  [facility];  Service: Endoscopy;  Laterality: N/A;  1030   COLONOSCOPY WITH PROPOFOL N/A 05/01/2019   Procedure: COLONOSCOPY WITH PROPOFOL;  Surgeon: Ruby Corporal, MD;  [facility];  Service: Endoscopy;  Laterality: N/A;  11:20am   CORONARY ARTERY BYPASS GRAFT N/A 04/27/2020   Procedure: CORONARY ARTERY BYPASS GRAFTING (CABG) TIMES FIVE USING LEFT INTERNAL MAMMARY ARTERY, LEFT HARVESTED RADIAL ARTERY, RIGHT GREATER SAPHENOUS VEIN HARVESTED ENDOSCOPICALLY.;  Surgeon: Rudine Cos, MD;  Location: [facility];  Service: Open Heart Surgery;  Laterality: N/A;   ESOPHAGEAL DILATION N/A 07/06/2015   Procedure: ESOPHAGEAL DILATION;  Surgeon: Ruby Corporal, MD;  [facility];  Service: Endoscopy;  Laterality: N/A;   ESOPHAGEAL DILATION N/A 06/06/2016   Procedure: ESOPHAGEAL DILATION;  Surgeon: Ruby Corporal, MD;  [facility];  Service: Endoscopy;  Laterality: N/A;   ESOPHAGEAL DILATION N/A 08/21/2022   Procedure: ESOPHAGEAL DILATION;  Surgeon: Urban Garden, MD;  [facility];  Service: Gastroenterology;  Laterality: N/A;   ESOPHAGOGASTRODUODENOSCOPY N/A 02/12/2014   Procedure: ESOPHAGOGASTRODUODENOSCOPY (EGD);  Surgeon: Ruby Corporal, MD;  [facility];  Service:  Endoscopy;  Laterality: N/A;  150   ESOPHAGOGASTRODUODENOSCOPY N/A 07/06/2015   Procedure: ESOPHAGOGASTRODUODENOSCOPY (EGD);  Surgeon: Ruby Corporal, MD;  [facility];  Service: Endoscopy;  Laterality: N/A;  1:25 - moved  to 1/18 @ 10:30 - Ann to notify pt   ESOPHAGOGASTRODUODENOSCOPY (EGD) WITH ESOPHAGEAL DILATION N/A 07/25/2012   Procedure: ESOPHAGOGASTRODUODENOSCOPY (EGD) WITH ESOPHAGEAL DILATION;  Surgeon: Ruby Corporal, MD;  [facility];  Service: Endoscopy;  Laterality: N/A;  325-rescheduled to 855 Ann notified pt   ESOPHAGOGASTRODUODENOSCOPY (EGD) WITH PROPOFOL N/A 06/06/2016   Procedure: ESOPHAGOGASTRODUODENOSCOPY (EGD) WITH PROPOFOL;  Surgeon: Ruby Corporal, MD;  [facility];  Service: Endoscopy;  Laterality: N/A;   ESOPHAGOGASTRODUODENOSCOPY (EGD) WITH PROPOFOL N/A 01/17/2021   Procedure: ESOPHAGOGASTRODUODENOSCOPY (EGD) WITH PROPOFOL;  Surgeon: Urban Garden, MD;  [facility];  Service: Gastroenterology;  Laterality: N/A;  10:35   ESOPHAGOGASTRODUODENOSCOPY (EGD) WITH PROPOFOL N/A 07/11/2021   Procedure: ESOPHAGOGASTRODUODENOSCOPY (EGD) WITH PROPOFOL;  Surgeon: Urban Garden, MD;  [facility];  Service: Gastroenterology;  Laterality: N/A;  11:15   ESOPHAGOGASTRODUODENOSCOPY (EGD) WITH PROPOFOL N/A 08/21/2022   Procedure: ESOPHAGOGASTRODUODENOSCOPY (EGD) WITH PROPOFOL;  Surgeon: Urban Garden, MD;  [facility];  Service: Gastroenterology;  Laterality: N/A;  1115AM, ASA 2   EYE SURGERY     cataracts removed   Foot surgeries Bilateral    hammer toes   LEFT HEART CATH AND CORONARY ANGIOGRAPHY N/A 04/26/2020   Procedure: LEFT HEART CATH AND CORONARY ANGIOGRAPHY;  Surgeon: Swazil[place], Peter M, MD;  Location: [facility] CV [facility];  Service: Cardiovascular;  Laterality: N/A;   LUMBAR LAMINECTOMY/DECOMPRESSION MICRODISCECTOMY Bilateral 11/20/2022   Procedure: Lumbar Five-Sacral One Laminectomy and  Foraminotomy;  Surgeon: Agustina Aldrich, MD;  Location: [facility];  Service: Neurosurgery;  Laterality: Bilateral;  3C   MALONEY DILATION N/A 02/12/2014   Procedure: MALONEY DILATION;  Surgeon: Ruby Corporal, MD;  [facility];  Service: Endoscopy;  Laterality: N/A;   POLYPECTOMY  05/01/2019   Procedure: POLYPECTOMY;  Surgeon: Ruby Corporal, MD;  [facility];  Service: Endoscopy;;  colon    RADIAL ARTERY HARVEST Left 04/27/2020   Procedure: LEFT RADIAL ARTERY HARVEST;  Surgeon: Rudine Cos, MD;  Location: [facility];  Service: Open Heart Surgery;  Laterality: Left;   Right knee arthroscopy     x2   TEE WITHOUT CARDIOVERSION N/A 04/27/2020   Procedure: TRANSESOPHAGEAL ECHOCARDIOGRAM (TEE);  Surgeon: Rudine Cos, MD;  Location: [facility] OR;  Service: Open Heart Surgery;  Laterality: N/A;   TONSILLECTOMY     TOTAL ABDOMINAL HYSTERECTOMY     TOTAL KNEE ARTHROPLASTY Right 04/03/2017   Procedure: RIGHT TOTAL KNEE ARTHROPLASTY;  Surgeon: Hazle Lites, MD;  Location: [facility];  Service: Orthopedics;  Laterality: Right;    Current Outpatient Medications  Medication Sig Dispense Refill   acyclovir (ZOVIRAX) 400 MG tablet Take 400 mg by mouth daily.     aspirin 81 MG EC tablet Take 1 tablet (81 mg total) by mouth daily. Swallow whole. 30 tablet 11   empagliflozin (JARDIANCE) 25 MG TABS tablet Take 1 tablet (25 mg total) by mouth daily before breakfast. 90 tablet 3   ezetimibe (ZETIA) 10 MG tablet take 1 tablet once daily. 90 tablet 2   ferrous sulfate 324 MG TBEC Take 324 mg by mouth daily with breakfast.     glipiZIDE (GLUCOTROL) 5 MG tablet Take 1 tablet (5 mg total) by mouth 2 (two) times daily before a meal. 60 tablet 2   isosorbide mononitrate (IMDUR) 30 MG 24 hr tablet take 1 tablet (30 MILLIGRAM total) by mouth daily. 90 tablet 1   levocetirizine (XYZAL) 5 MG tablet  Take 5 mg by mouth every evening.     metFORMIN (GLUCOPHAGE) 1000 MG tablet Take 1 tablet (1,000 mg total)  by mouth daily with breakfast. 90 tablet 3   metoprolol tartrate (LOPRESSOR) 25 MG tablet take (1/2) tablet by mouth 2 times a day. 90 tablet 2   nitroGLYCERIN (NITROSTAT) 0.4 MG SL tablet Place 1 tablet (0.4 mg total) under the tongue every 5 (five) minutes as needed for chest pain. 25 tablet 3   omeprazole (PRILOSEC) 40 MG capsule Take 40 mg by mouth daily.     oxybutynin (DITROPAN-XL) 10 MG 24 hr tablet Take 10 mg by mouth every evening.     rosuvastatin (CRESTOR) 40 MG tablet take 1 tablet by mouth once daily. 90 tablet 2   No current facility-administered medications for this visit.    Allergies as of 09/24/2023 - Review Complete 09/24/2023  Allergen Reaction Noted   Bee venom Anaphylaxis 08/05/2015   Levofloxacin Anaphylaxis and Rash 04/03/2017   Cardizem [diltiazem] Other (See Comments) 08/17/2015   Sulfa antibiotics Itching 07/13/2014   Diazepam Nausea Only 04/03/2017   Lipitor [atorvastatin] Other (See Comments) 10/12/2019    Social History   Socioeconomic History   Marital status: Divorced    Spouse name: Not on file   Number of children: Not on file   Years of education: Not on file   Highest education level: Not on file  Occupational History   Not on file  Tobacco Use   Smoking status: Never   Smokeless tobacco: Never  Vaping Use   Vaping status: Never Used  Substance and Sexual Activity   Alcohol use: No    Alcohol/week: 0.0 standard drinks of alcohol    Comment: socially    Drug use: No   Sexual activity: Not Currently    Partners: Male    Birth control/protection: None  Other Topics Concern   Not on file  Social History Narrative   Not on file   Social Drivers of Health   Financial Resource Strain: Not on file  Food Insecurity: Not on file  Transportation Needs: Not on file  Physical Activity: Not on file  Stress: Not on file  Social Connections: Not on file    Review of systems General: negative for malaise, night sweats, fever, chills,  weight loss Neck: Negative for lumps, goiter, pain and significant neck swelling Resp: Negative for cough, wheezing, dyspnea at rest CV: Negative for chest pain, leg swelling, palpitations, orthopnea GI: denies melena, hematochezia, vomiting, diarrhea, dysphagia, odyonophagia, early satiety or unintentional weight loss. +nausea +fullness +early satiety +melena +constipation +rectal discomfort  MSK: Negative for joint pain or swelling, back pain, and muscle pain. Derm: Negative for itching or rash Psych: Denies depression, anxiety, memory loss, confusion. No homicidal or suicidal ideation.  Heme: Negative for prolonged bleeding, bruising easily, and swollen nodes. Endocrine: Negative for cold or heat intolerance, polyuria, polydipsia and goiter. Neuro: negative for tremor, gait imbalance, syncope and seizures. The remainder of the review of systems is noncontributory.  Physical Exam: BP (!) 117/56 (BP Location: Left Arm, Patient Position: Sitting, Cuff Size: Normal)   Pulse 72   Temp (!) 97.5 F (36.4 C) (Temporal)   Ht 5' 2.5" (1.588 m)   Wt 129 lb 4.8 oz (58.7 kg)   BMI 23.27 kg/m  General:   Alert and oriented. No distress noted. Pleasant and cooperative.  Head:  Normocephalic and atraumatic. Eyes:  Conjuctiva clear without scleral icterus. Mouth:  Oral mucosa pink and moist. Good  dentition. No lesions. Heart: Normal rate and rhythm, s1 and s2 heart sounds present.  Lungs: Clear lung sounds in all lobes. Respirations equal and unlabored. Abdomen:  +BS, soft, diffuse abdominal tenderness and non-distended. No rebound or guarding. No HSM or masses noted. Rectal: crystal sutton CMA present as witness, small, external non bleedign, non thrombosed hemorrhoid present DRE with normal sphincter tone, no obvious lesions or masses other than hemrorhoid Derm: No palmar erythema or jaundice Msk:  Symmetrical without gross deformities. Normal posture. Extremities:  Without edema. Neurologic:   Alert and  oriented x4 Psych:  Alert and cooperative. Normal mood and affect.  Invalid input(s): "6 MONTHS"   ASSESSMENT: Katrina Castro is a 78 y.o. female presenting today for constipation, melena and abdominal pain  Patient with reports of constipation for the past 3 to 4 days.  She has mid, periumbilical abdominal pain as well as feeling full nausea and some Sunday.  Of note she had a lot of diffuse abdominal abdominal tenderness on exam today.  Suspect abdominal pain is secondary to constipation given abnormal exam abdominal exam and recommended proceeding with CTAP with contrast at to rule out any other acute findings today.  Recommend to start MiraLAX 1 capful 3 times daily, increase water intake as well as fruits, veggies, whole grains in her diet.  Patient also reports nausea, early satiety and dark stools.  Note she was recently started on iron pills though she had dark stools prior to this.  She has never had an upper endoscopy.  Recommend proceeding with upper endoscopy for evaluation of her upper GI symptoms/melena.  I do not see any iron studies have been done in her chart, we will check H&H and iron studies today.  Would recommend updating colonoscopy as well if iron studies show IDA.  Patient also reports some rectal pain, rectal exam today with presence of hemorrhoids. Will send Anusol to be used twice daily x 10 days.  Should try to avoid straining and limit toilet time is much as possible.  Given patient's reported significant abdominal pain today in the office, I did discuss with her proceeding to the ER for more acute evaluation though at this time patient declines.  She is aware if she has rectal bleeding, vomiting that does not subside, fevers, worsening abdominal pain she needs to proceed to the ER for further evaluation.   PLAN:  -schedule EGD -check h&h and iron studies  -consider repeat colonoscopy pending iron studies  -start miralax 1 capful TID  -Increase water  intake, aim for atleast 64 oz per day -anusol BID x10 days -limit toilet time, avoid straining  -Increase fruits, veggies and whole grains, kiwi and prunes are especially good for constipation  All questions were answered, patient verbalized understanding and is in agreement with plan as outlined above.    Follow Up: 6 weeks  Janaysia Mcleroy L. Adrien Alberta, MSN, APRN, AGNP-C Adult-Gerontology Nurse Practitioner [facility]  I have reviewed the note and agree with the APP's assessment as described in this progress note  Samantha Cress, MD Gastroenterology and Hepatology [facility]

## 2023-09-24 NOTE — Patient Instructions (Signed)
-  we will schedule upper endoscopy due to black stools -depending on iron levels, we may also do a colonoscopy at that time - I am ordering a stat CT of your abdomen -start miralax 1 capful Three times per day -you can continue stool softener daily -Increase water intake, aim for atleast 64 oz per day -Increase fruits, veggies and whole grains, kiwi and prunes are especially good for constipation  As discussed, if pain becomes more severe, you develop rectal bleeding, vomiting or a fever, you need to proceed to the ER  Follow up 6 weeks

## 2023-09-25 ENCOUNTER — Other Ambulatory Visit (INDEPENDENT_AMBULATORY_CARE_PROVIDER_SITE_OTHER): Payer: Self-pay | Admitting: Gastroenterology

## 2023-09-25 LAB — CBC
HCT: 38.5 % (ref 35.0–45.0)
Hemoglobin: 11.6 g/dL — ABNORMAL LOW (ref 11.7–15.5)
MCH: 22.8 pg — ABNORMAL LOW (ref 27.0–33.0)
MCHC: 30.1 g/dL — ABNORMAL LOW (ref 32.0–36.0)
MCV: 75.8 fL — ABNORMAL LOW (ref 80.0–100.0)
MPV: 10.2 fL (ref 7.5–12.5)
Platelets: 118 10*3/uL — ABNORMAL LOW (ref 140–400)
RBC: 5.08 10*6/uL (ref 3.80–5.10)
RDW: 15 % (ref 11.0–15.0)
WBC: 7.4 10*3/uL (ref 3.8–10.8)

## 2023-09-25 LAB — COMPREHENSIVE METABOLIC PANEL WITH GFR
AG Ratio: 1.7 (calc) (ref 1.0–2.5)
ALT: 10 U/L (ref 6–29)
AST: 17 U/L (ref 10–35)
Albumin: 4.7 g/dL (ref 3.6–5.1)
Alkaline phosphatase (APISO): 75 U/L (ref 37–153)
BUN: 12 mg/dL (ref 7–25)
CO2: 30 mmol/L (ref 20–32)
Calcium: 10.2 mg/dL (ref 8.6–10.4)
Chloride: 103 mmol/L (ref 98–110)
Creat: 0.64 mg/dL (ref 0.60–1.00)
Globulin: 2.8 g/dL (ref 1.9–3.7)
Glucose, Bld: 121 mg/dL — ABNORMAL HIGH (ref 65–99)
Potassium: 4.3 mmol/L (ref 3.5–5.3)
Sodium: 140 mmol/L (ref 135–146)
Total Bilirubin: 0.3 mg/dL (ref 0.2–1.2)
Total Protein: 7.5 g/dL (ref 6.1–8.1)
eGFR: 92 mL/min/{1.73_m2} (ref >=60)

## 2023-09-25 LAB — IRON,TIBC AND FERRITIN PANEL
%SAT: 12 % — ABNORMAL LOW (ref 16–45)
Ferritin: 8 ng/mL — ABNORMAL LOW (ref 16–288)
Iron: 62 ug/dL (ref 45–160)
TIBC: 508 ug/dL — ABNORMAL HIGH (ref 250–450)

## 2023-09-25 MED ORDER — PEG 3350-KCL-NA BICARB-NACL 420 G PO SOLR: 4000.0000 mL | Freq: Once | ORAL | 0 refills | Status: AC

## 2023-09-26 ENCOUNTER — Telehealth (INDEPENDENT_AMBULATORY_CARE_PROVIDER_SITE_OTHER): Payer: Self-pay

## 2023-09-26 NOTE — Telephone Encounter (Signed)
 Patient called back today says she had some issues with abdominal pain and had a green bowel movement today. She says she has not eaten anything green and she does not know what could have caused this. She reports she took Miralax last night and had the episode today with the abdominal pain and green stools in the toilet, she says she Filled the toilet up with stool today. She says she did take the Miralax again this afternoon and wanted to know if she still needs to take this now that her bowels are moving. I advised she may need to take it at least once per day to keep things moving. I advised if she is having severe abdominal pain she may need to go to the Ed. She says she is afraid she has some type of infection, and she has chills, she has not checked her temp. I advised we have her scheduled for upcoming Egd on 10/08/2023 and per Kenney Houseman we do not have anything any sooner, and patient had a ct abd/pelvis on 09/24/2023, that showed the following,  IMPRESSION: 1. No acute inflammatory process identified within the abdomen or pelvis. 2. Multiple other nonacute observations, as described above. 3. Aortic atherosclerosis.   Aortic Atherosclerosis (ICD10-I70.0).  I advised if she is having any other none GI related issues she may need to reach out to the pcp, and if continues with severe abdominal pain she may need to head back to the Ed. Patient states understanding.

## 2023-09-26 NOTE — Telephone Encounter (Signed)
 Patient made aware to continue Miralax at least once per day to keep things moving.

## 2023-09-26 NOTE — Telephone Encounter (Signed)
 Error

## 2023-09-30 ENCOUNTER — Telehealth (INDEPENDENT_AMBULATORY_CARE_PROVIDER_SITE_OTHER): Payer: Self-pay | Admitting: *Deleted

## 2023-09-30 NOTE — Telephone Encounter (Signed)
 Patient last seen 09/24/23 for constipation. Patient has appt for tcs and egd on 4/22. She is taking one capful daily of miralax. Had a small BM yesterday after taking a dulcolax. Yesterday states she was doubled over in pain and had a lot of gas. Took gas x and it did not help. Today having some pain around waist that is burning. Has had some nausea but not as bad today. No vomiting. States her stool was black 2 -3 times over the past week. She has not been taking her iron in about 1 -2 weeks. Has not noticed any bleeding. Had some dizziness one time when she jumped up to go to the bathroom. She had CT 09/24/23.

## 2023-09-30 NOTE — Telephone Encounter (Signed)
 Please ask her to increase her MiraLAX to 3 capfuls per day.  If she is having worsening abdominal pain, she will need to be seen in the ER emergently for possible CT scan of the abdomen.

## 2023-09-30 NOTE — Telephone Encounter (Signed)
 Discussed with patient per Dr. Sammi Crick -  Please ask her to increase her MiraLAX to 3 capfuls per day.  If she is having worsening abdominal pain, she will need to be seen in the ER emergently for possible CT scan of the abdomen.  Patient verbalized understanding.

## 2023-10-01 DIAGNOSIS — K921 Melena: Secondary | ICD-10-CM | POA: Insufficient documentation

## 2023-10-01 DIAGNOSIS — R1084 Generalized abdominal pain: Secondary | ICD-10-CM | POA: Insufficient documentation

## 2023-10-01 DIAGNOSIS — R110 Nausea: Secondary | ICD-10-CM | POA: Insufficient documentation

## 2023-10-01 DIAGNOSIS — D649 Anemia, unspecified: Secondary | ICD-10-CM | POA: Insufficient documentation

## 2023-10-02 ENCOUNTER — Ambulatory Visit: Admitting: Gastroenterology

## 2023-10-03 ENCOUNTER — Encounter (HOSPITAL_COMMUNITY): Payer: Self-pay

## 2023-10-03 ENCOUNTER — Other Ambulatory Visit: Payer: Self-pay

## 2023-10-03 ENCOUNTER — Encounter (HOSPITAL_COMMUNITY)
Admission: RE | Admit: 2023-10-03 | Discharge: 2023-10-03 | Disposition: A | Discharge status: Home / Self Care | Source: Ambulatory Visit | Attending: Gastroenterology | Admitting: Gastroenterology

## 2023-10-08 ENCOUNTER — Ambulatory Visit (HOSPITAL_COMMUNITY)
Admission: RE | Admit: 2023-10-08 | Discharge: 2023-10-08 | Disposition: A | Discharge status: Home / Self Care | Attending: Gastroenterology | Admitting: Gastroenterology

## 2023-10-08 ENCOUNTER — Other Ambulatory Visit: Payer: Self-pay

## 2023-10-08 ENCOUNTER — Ambulatory Visit (HOSPITAL_COMMUNITY)

## 2023-10-08 ENCOUNTER — Ambulatory Visit (HOSPITAL_BASED_OUTPATIENT_CLINIC_OR_DEPARTMENT_OTHER)

## 2023-10-08 ENCOUNTER — Encounter (HOSPITAL_COMMUNITY): Payer: Self-pay | Admitting: Gastroenterology

## 2023-10-08 ENCOUNTER — Encounter (HOSPITAL_COMMUNITY)
Admission: RE | Disposition: A | Discharge status: Home / Self Care | Payer: Self-pay | Source: Home / Self Care | Attending: Gastroenterology

## 2023-10-08 ENCOUNTER — Telehealth (INDEPENDENT_AMBULATORY_CARE_PROVIDER_SITE_OTHER): Payer: Self-pay | Admitting: Gastroenterology

## 2023-10-08 DIAGNOSIS — I10 Essential (primary) hypertension: Secondary | ICD-10-CM | POA: Diagnosis not present

## 2023-10-08 DIAGNOSIS — K3189 Other diseases of stomach and duodenum: Secondary | ICD-10-CM | POA: Diagnosis not present

## 2023-10-08 DIAGNOSIS — K921 Melena: Secondary | ICD-10-CM | POA: Diagnosis not present

## 2023-10-08 DIAGNOSIS — Z79899 Other long term (current) drug therapy: Secondary | ICD-10-CM | POA: Insufficient documentation

## 2023-10-08 DIAGNOSIS — K449 Diaphragmatic hernia without obstruction or gangrene: Secondary | ICD-10-CM

## 2023-10-08 DIAGNOSIS — E119 Type 2 diabetes mellitus without complications: Secondary | ICD-10-CM | POA: Diagnosis not present

## 2023-10-08 DIAGNOSIS — I2510 Atherosclerotic heart disease of native coronary artery without angina pectoris: Secondary | ICD-10-CM | POA: Insufficient documentation

## 2023-10-08 DIAGNOSIS — F32A Depression, unspecified: Secondary | ICD-10-CM | POA: Diagnosis not present

## 2023-10-08 DIAGNOSIS — K5730 Diverticulosis of large intestine without perforation or abscess without bleeding: Secondary | ICD-10-CM

## 2023-10-08 DIAGNOSIS — D509 Iron deficiency anemia, unspecified: Secondary | ICD-10-CM | POA: Diagnosis not present

## 2023-10-08 DIAGNOSIS — I25119 Atherosclerotic heart disease of native coronary artery with unspecified angina pectoris: Secondary | ICD-10-CM

## 2023-10-08 DIAGNOSIS — K31811 Angiodysplasia of stomach and duodenum with bleeding: Secondary | ICD-10-CM | POA: Diagnosis not present

## 2023-10-08 DIAGNOSIS — K649 Unspecified hemorrhoids: Secondary | ICD-10-CM

## 2023-10-08 DIAGNOSIS — K5900 Constipation, unspecified: Secondary | ICD-10-CM | POA: Diagnosis not present

## 2023-10-08 DIAGNOSIS — K219 Gastro-esophageal reflux disease without esophagitis: Secondary | ICD-10-CM | POA: Diagnosis not present

## 2023-10-08 DIAGNOSIS — Z7984 Long term (current) use of oral hypoglycemic drugs: Secondary | ICD-10-CM | POA: Insufficient documentation

## 2023-10-08 HISTORY — PX: ESOPHAGOGASTRODUODENOSCOPY: SHX5428

## 2023-10-08 HISTORY — PX: COLONOSCOPY: SHX5424

## 2023-10-08 LAB — GLUCOSE, CAPILLARY: Glucose-Capillary: 132 mg/dL — ABNORMAL HIGH (ref 70–99)

## 2023-10-08 SURGERY — EGD (ESOPHAGOGASTRODUODENOSCOPY)
Anesthesia: General

## 2023-10-08 MED ORDER — LINACLOTIDE 72 MCG PO CAPS: 72.0000 ug | ORAL_CAPSULE | Freq: Every day | ORAL | 3 refills | Status: DC

## 2023-10-08 MED ORDER — LACTATED RINGERS IV SOLN: INTRAVENOUS | Status: DC | PRN

## 2023-10-08 MED ORDER — LIDOCAINE HCL (PF) 2 % IJ SOLN
INTRAMUSCULAR | Status: DC | PRN
Start: 2023-10-08 — End: 2023-10-08
  Administered 2023-10-08: 50 mg via INTRADERMAL

## 2023-10-08 MED ORDER — PHENYLEPHRINE 80 MCG/ML (10ML) SYRINGE FOR IV PUSH (FOR BLOOD PRESSURE SUPPORT)
PREFILLED_SYRINGE | INTRAVENOUS | Status: DC | PRN
  Administered 2023-10-08 (×4): 80 ug via INTRAVENOUS

## 2023-10-08 MED ORDER — PROPOFOL 10 MG/ML IV BOLUS
INTRAVENOUS | Status: DC | PRN
  Administered 2023-10-08: 30 mg via INTRAVENOUS
  Administered 2023-10-08: 150 ug/kg/min via INTRAVENOUS
  Administered 2023-10-08: 10 mg via INTRAVENOUS
  Administered 2023-10-08: 60 mg via INTRAVENOUS

## 2023-10-08 MED ORDER — PHENYLEPHRINE 80 MCG/ML (10ML) SYRINGE FOR IV PUSH (FOR BLOOD PRESSURE SUPPORT)
PREFILLED_SYRINGE | INTRAVENOUS | Status: AC
  Filled 2023-10-08: qty 10

## 2023-10-08 MED ORDER — OMEPRAZOLE 40 MG PO CPDR: 40.0000 mg | DELAYED_RELEASE_CAPSULE | Freq: Two times a day (BID) | ORAL | 0 refills | Status: DC

## 2023-10-08 MED ORDER — STERILE WATER FOR IRRIGATION IR SOLN
Status: DC | PRN
  Administered 2023-10-08 (×5): 60 mL

## 2023-10-08 NOTE — Telephone Encounter (Signed)
 She can try a "high fiber diet". I do not know if we have a template for this, but it basically contain more kiwi, prunes and vegetables

## 2023-10-08 NOTE — Interval H&P Note (Signed)
 History and Physical Interval Note:  10/08/2023 8:00 AM  Katrina Castro  has presented today for surgery, with the diagnosis of MELENA, iron deficiency anemia.  The various methods of treatment have been discussed with the patient and family. After consideration of risks, benefits and other options for treatment, the patient has consented to  Procedure(s) with comments: EGD (ESOPHAGOGASTRODUODENOSCOPY) (N/A) - 10:30AM;ASA 3 COLONOSCOPY (N/A) as a surgical intervention.  The patient's history has been reviewed, patient examined, no change in status, stable for surgery.  I have reviewed the patient's chart and labs.  Questions were answered to the patient's satisfaction.     [name]

## 2023-10-08 NOTE — Discharge Instructions (Addendum)
 You are being discharged to home.  Resume your previous diet.  We are waiting for your pathology results.  Take Prilosec (omeprazole ) 40 mg by mouth twice a day for three months.  Your physician has recommended a repeat colonoscopy in three months because the bowel preparation was suboptimal. Will need a two day prep. Continue Miralax  three times a day. Start Linzess  72 mcg qday.

## 2023-10-08 NOTE — Anesthesia Procedure Notes (Addendum)
 Date/Time: 10/08/2023 10:39 AM  Performed by: Sherwin Donate, CRNAPre-anesthesia Checklist: Patient identified, Emergency Drugs available, Suction available and Patient being monitored Patient Re-evaluated:Patient Re-evaluated prior to induction Oxygen  Delivery Method: Nasal cannula Induction Type: IV induction Placement Confirmation: positive ETCO2 Comments: Optiflow used

## 2023-10-08 NOTE — Telephone Encounter (Signed)
 No food restrictions, she can back on her diet

## 2023-10-08 NOTE — Op Note (Signed)
 [facility] Patient Name: Katrina Castro Procedure Date: 10/08/2023 10:23 AM MRN: [id number] Date of Birth: 17-Dec-1945 Attending MD: Samantha Cress , , [id number] CSN: [id number] Age: 78 Admit Type: Outpatient Procedure:                Upper GI endoscopy Indications:              Melena Providers:                Samantha Cress, Pasco Bond, RN, Sharlette Dayhoff                            Technician, Technician Referring MD:              Medicines:                Monitored Anesthesia Care Complications:            No immediate complications. Estimated Blood Loss:     Estimated blood loss: none. Procedure:                Pre-Anesthesia Assessment:                           - Prior to the procedure, a History and Physical                            was performed, and patient medications, allergies                            and sensitivities were reviewed. The patient's                            tolerance of previous anesthesia was reviewed.                           - The risks and benefits of the procedure and the                            sedation options and risks were discussed with the                            patient. All questions were answered and informed                            consent was obtained.                           - ASA Grade Assessment: III - A patient with severe                            systemic disease.                           After obtaining informed consent, the endoscope was                            passed under direct vision. Throughout the  procedure, the patient's blood pressure, pulse, and                            oxygen  saturations were monitored continuously. The                            [id number]-[id number] ([id number]) scope was introduced through the                            mouth, and advanced to the second part of duodenum.                            The upper GI endoscopy was accomplished without                             difficulty. The patient tolerated the procedure                            well. Scope In: 10:35:58 AM Scope Out: 10:50:41 AM Total Procedure Duration: 0 hours 14 minutes 43 seconds  Findings:      A 3 cm hiatal hernia was present.      A few diminutive angiodysplastic lesions with bleeding were found in the       gastric body. Coagulation for hemostasis using argon plasma at 0.3       liters/minute and 20 watts was successful. Biopsies were taken with a       cold forcepsfrom Nomal stomach for Helicobacter pylori testing.      The examined duodenum was normal. Impression:               - 3 cm hiatal hernia.                           - A few bleeding angiodysplastic lesions in the                            stomach. Treated with argon plasma coagulation                            (APC). Nomal stomach biopsied.                           - Normal examined duodenum. Moderate Sedation:      Per Anesthesia Care Recommendation:           - Discharge patient to home (ambulatory).                           - Resume previous diet.                           - Await pathology results.                           - Use Prilosec (omeprazole ) 40 mg PO BID for 3  months. Procedure Code(s):        --- Professional ---                           [phone], 59, Esophagogastroduodenoscopy, flexible,                            transoral; with control of bleeding, any method                           43239, Esophagogastroduodenoscopy, flexible,                            transoral; with biopsy, single or multiple Diagnosis Code(s):        --- Professional ---                           K44.9, Diaphragmatic hernia without obstruction or                            gangrene                           K31.811, Angiodysplasia of stomach and duodenum                            with bleeding                           K92.1, Melena (includes Hematochezia) CPT copyright 2022 American  Medical Association. All rights reserved. The codes documented in this report are preliminary and upon coder review may  be revised to meet current compliance requirements. Samantha Cress, MD Samantha Cress,  10/08/2023 11:17:03 AM This report has been signed electronically. Number of Addenda: 0

## 2023-10-08 NOTE — Telephone Encounter (Signed)
 I spoke with the patient and made her aware she had no food restrictions she may go back to her diet as usual. Patient says she wants a diet to help her "bowels" feel better to help with good digestion. Please advise

## 2023-10-08 NOTE — Telephone Encounter (Signed)
 Pt had her procedure today and called asking for a diet sheet of what she can eat. [phone]

## 2023-10-08 NOTE — Transfer of Care (Signed)
 Immediate Anesthesia Transfer of Care Note  Patient: Katrina Castro  Procedure(s) Performed: EGD (ESOPHAGOGASTRODUODENOSCOPY) COLONOSCOPY  Patient Location: Short Stay  Anesthesia Type:General  Level of Consciousness: drowsy  Airway & Oxygen  Therapy: Patient Spontanous Breathing  Post-op Assessment: Report given to RN and Post -op Vital signs reviewed and stable  Post vital signs: Reviewed and stable  Last Vitals:  Vitals Value Taken Time  BP 108/44 10/08/23 1117  Temp 36.4 C 10/08/23 1117  Pulse 74 10/08/23 1117  Resp 17 10/08/23 1117  SpO2 100 % 10/08/23 1117    Last Pain:  Vitals:   10/08/23 1117  TempSrc: Oral  PainSc: 0-No pain         Complications: No notable events documented.

## 2023-10-08 NOTE — Op Note (Signed)
 [facility] Patient Name: Katrina Castro Procedure Date: 10/08/2023 10:22 AM MRN: [id number] Date of Birth: 03/09/1946 Attending MD: Samantha Cress , , [id number] CSN: [id number] Age: 78 Admit Type: Outpatient Procedure:                Colonoscopy Indications:              Constipation Providers:                Samantha Cress, Pasco Bond, RN, Sharlette Dayhoff                            Technician, Technician Referring MD:              Medicines:                Monitored Anesthesia Care Complications:            No immediate complications. Estimated Blood Loss:     Estimated blood loss: none. Procedure:                Pre-Anesthesia Assessment:                           - Prior to the procedure, a History and Physical                            was performed, and patient medications, allergies                            and sensitivities were reviewed. The patient's                            tolerance of previous anesthesia was reviewed.                           - The risks and benefits of the procedure and the                            sedation options and risks were discussed with the                            patient. All questions were answered and informed                            consent was obtained.                           - ASA Grade Assessment: II - A patient with mild                            systemic disease.                           After obtaining informed consent, the colonoscope                            was passed under direct vision. Throughout the  procedure, the patient's blood pressure, pulse, and                            oxygen  saturations were monitored continuously. The                            [id number]-HQ190L ([id number]) scope was introduced through                            the anus and advanced to the the cecum, identified                            by appendiceal orifice and ileocecal valve. The                             colonoscopy was performed without difficulty. The                            patient tolerated the procedure well. The quality                            of the bowel preparation was inadequate. Scope In: 10:56:37 AM Scope Out: 11:11:47 AM Scope Withdrawal Time: 0 hours 10 minutes 47 seconds  Total Procedure Duration: 0 hours 15 minutes 10 seconds  Findings:      Hemorrhoids were found on perianal exam.      A moderate amount of solid stool was found in the sigmoid colon, in the       transverse colon and in the cecum, interfering with visualization. I       attempted to do a thorough evaluation of the colon as thorough as       possible but given bowel prep there are areas that could have small       polyps or lesions. No large masses or overt bleeding was seen.      A few small-mouthed diverticula were found in the sigmoid colon.      The retroflexed view of the distal rectum and anal verge was normal and       showed no anal or rectal abnormalities. Impression:               - Preparation of the colon was inadequate.                           - Hemorrhoids found on perianal exam.                           - Stool in the sigmoid colon, in the transverse                            colon and in the cecum.                           - Diverticulosis in the sigmoid colon.                           - The distal rectum  and anal verge are normal on                            retroflexion view.                           - No specimens collected. Moderate Sedation:      Per Anesthesia Care Recommendation:           - Discharge patient to home (ambulatory).                           - Resume previous diet.                           - Repeat colonoscopy in 3 months because the bowel                            preparation was suboptimal. Will need a two day                            prep.                           - Continue Miralax  three times a day.                           - Start  Linzess  72 mcg qday. Procedure Code(s):        --- Professional ---                           [phone], Colonoscopy, flexible; diagnostic, including                            collection of specimen(s) by brushing or washing,                            when performed (separate procedure) Diagnosis Code(s):        --- Professional ---                           K64.9, Unspecified hemorrhoids                           K59.00, Constipation, unspecified                           K57.30, Diverticulosis of large intestine without                            perforation or abscess without bleeding CPT copyright 2022 American Medical Association. All rights reserved. The codes documented in this report are preliminary and upon coder review may  be revised to meet current compliance requirements. Samantha Cress, MD Samantha Cress,  10/08/2023 11:22:40 AM This report has been signed electronically. Number of Addenda: 0

## 2023-10-08 NOTE — Anesthesia Preprocedure Evaluation (Signed)
 Anesthesia Evaluation  Patient identified by MRN, date of birth, ID band Patient awake    Reviewed: Allergy & Precautions, H&P , NPO status , Patient's Chart, lab work & pertinent test results, reviewed documented beta blocker date and time   Airway Mallampati: II  TM Distance: >3 FB Neck ROM: full    Dental no notable dental hx.    Pulmonary neg pulmonary ROS   Pulmonary exam normal breath sounds clear to auscultation       Cardiovascular Exercise Tolerance: Good hypertension, + angina  + CAD  negative cardio ROS + dysrhythmias  Rhythm:regular Rate:Normal     Neuro/Psych  Headaches PSYCHIATRIC DISORDERS  Depression    negative neurological ROS  negative psych ROS   GI/Hepatic negative GI ROS, Neg liver ROS,GERD  ,,  Endo/Other  negative endocrine ROSdiabetes    Renal/GU negative Renal ROS  negative genitourinary   Musculoskeletal   Abdominal   Peds  Hematology negative hematology ROS (+) Blood dyscrasia, anemia   Anesthesia Other Findings   Reproductive/Obstetrics negative OB ROS                             Anesthesia Physical Anesthesia Plan  ASA: 3  Anesthesia Plan: General   Post-op Pain Management:    Induction:   PONV Risk Score and Plan: Propofol  infusion  Airway Management Planned:   Additional Equipment:   Intra-op Plan:   Post-operative Plan:   Informed Consent: I have reviewed the patients History and Physical, chart, labs and discussed the procedure including the risks, benefits and alternatives for the proposed anesthesia with the patient or authorized representative who has indicated his/her understanding and acceptance.     Dental Advisory Given  Plan Discussed with: CRNA  Anesthesia Plan Comments:        Anesthesia Quick Evaluation

## 2023-10-08 NOTE — Telephone Encounter (Signed)
 Dr. Sammi Crick, what diet does this patient need? Please advise.

## 2023-10-09 ENCOUNTER — Telehealth: Payer: Self-pay | Admitting: Internal Medicine

## 2023-10-09 ENCOUNTER — Encounter (HOSPITAL_COMMUNITY): Payer: Self-pay | Admitting: Gastroenterology

## 2023-10-09 ENCOUNTER — Encounter (INDEPENDENT_AMBULATORY_CARE_PROVIDER_SITE_OTHER): Payer: Self-pay

## 2023-10-09 NOTE — Telephone Encounter (Signed)
 Patient notified of providers recommendations of agreement and voiced understanding.

## 2023-10-09 NOTE — Telephone Encounter (Signed)
 I spoke with the patient and made her aware I have sent a high fiber diet through My Chart. Patient says she can not get on My Chart, so I have mailed a copy of her high fiber diet to her. She is aware this is mostly kiwi, fruits and vegetables.

## 2023-10-09 NOTE — Telephone Encounter (Signed)
 Pt wants Dr Avanell Bob to look at her Colonoscopy and Upper Endoscopy she had done yesterday and tell her what she thinks

## 2023-10-10 ENCOUNTER — Telehealth (INDEPENDENT_AMBULATORY_CARE_PROVIDER_SITE_OTHER): Payer: Self-pay | Admitting: *Deleted

## 2023-10-10 LAB — SURGICAL PATHOLOGY

## 2023-10-10 NOTE — Telephone Encounter (Signed)
 Patient left voicemail saying she was having pain. I called her back and she said she was having pain but since she called it stopped after having two bowel movements. She took linzess  this morning and states she is feeling better now. Advised her to call back if the pain returns or go to ED if pain is severe. She verbalized understanding.

## 2023-10-11 NOTE — Anesthesia Postprocedure Evaluation (Signed)
 Anesthesia Post Note  Patient: Katrina Castro  Procedure(s) Performed: EGD (ESOPHAGOGASTRODUODENOSCOPY) COLONOSCOPY  Patient location during evaluation: Phase II Anesthesia Type: General Level of consciousness: awake Pain management: pain level controlled Vital Signs Assessment: post-procedure vital signs reviewed and stable Respiratory status: spontaneous breathing and respiratory function stable Cardiovascular status: blood pressure returned to baseline and stable Postop Assessment: no headache and no apparent nausea or vomiting Anesthetic complications: no Comments: Late entry   No notable events documented.   Last Vitals:  Vitals:   10/08/23 1117 10/08/23 1121  BP: (!) 108/44 (!) 121/49  Pulse: 74   Resp: 17   Temp: (!) 36.4 C   SpO2: 100%     Last Pain:  Vitals:   10/09/23 1432  TempSrc:   PainSc: 0-No pain                 Coretha Dew

## 2023-10-14 ENCOUNTER — Encounter (INDEPENDENT_AMBULATORY_CARE_PROVIDER_SITE_OTHER): Payer: Self-pay | Admitting: *Deleted

## 2023-10-15 ENCOUNTER — Other Ambulatory Visit: Payer: Self-pay | Admitting: Internal Medicine

## 2023-10-16 ENCOUNTER — Other Ambulatory Visit (INDEPENDENT_AMBULATORY_CARE_PROVIDER_SITE_OTHER): Payer: Self-pay | Admitting: Gastroenterology

## 2023-10-16 ENCOUNTER — Ambulatory Visit: Payer: Medicare Other | Admitting: Nurse Practitioner

## 2023-10-16 ENCOUNTER — Telehealth (INDEPENDENT_AMBULATORY_CARE_PROVIDER_SITE_OTHER): Payer: Self-pay

## 2023-10-16 DIAGNOSIS — R109 Unspecified abdominal pain: Secondary | ICD-10-CM

## 2023-10-16 MED ORDER — DICYCLOMINE HCL 10 MG PO CAPS: 10.0000 mg | ORAL_CAPSULE | Freq: Two times a day (BID) | ORAL | 1 refills | Status: DC | PRN

## 2023-10-16 NOTE — Telephone Encounter (Signed)
 Patient called today saying she has had some left side pain on going for several days. She wants to know if this is normal for her condition. She has had a bm this morning that she says was normal,but was dark in color. She denies any nausea, vomiting, fevers, or sight of blood in stools. She uses [employer]. Please advise.

## 2023-10-16 NOTE — Telephone Encounter (Signed)
 I spoke with the patient and made her aware per Dr. Sammi Crick,  Please let her know that this is something that can happen often in patients to have irritable bowel syndrome, as it is her case.  I sent a prescription for dicyclomine  as needed for abdominal pain.      Patient states understanding.

## 2023-10-16 NOTE — Telephone Encounter (Signed)
 Please let her know that this is something that can happen often in patients to have irritable bowel syndrome, as it is her case.  I sent a prescription for dicyclomine  as needed for abdominal pain.

## 2023-10-17 DIAGNOSIS — Z6824 Body mass index (BMI) 24.0-24.9, adult: Secondary | ICD-10-CM | POA: Diagnosis not present

## 2023-10-17 DIAGNOSIS — E538 Deficiency of other specified B group vitamins: Secondary | ICD-10-CM | POA: Diagnosis not present

## 2023-10-17 DIAGNOSIS — I10 Essential (primary) hypertension: Secondary | ICD-10-CM | POA: Diagnosis not present

## 2023-10-17 DIAGNOSIS — Z299 Encounter for prophylactic measures, unspecified: Secondary | ICD-10-CM | POA: Diagnosis not present

## 2023-10-18 ENCOUNTER — Telehealth (INDEPENDENT_AMBULATORY_CARE_PROVIDER_SITE_OTHER): Payer: Self-pay | Admitting: *Deleted

## 2023-10-18 NOTE — Telephone Encounter (Signed)
 Patient called states she woke at 4am with pain and had BM that was 6 -7 small hard balls. She said she takes her linzess  every morning before breakfast and miralax  one capful three times per day but yesterday only took twice forgot one dose. She said she feels better since having the BM this morning but wants to know what else she can do. I asked her how much water  she was drinking and she said not enough. I told her to drink at leasat 64 oz a day like your note told her to do and I would send a note back to see if there were any other recommendations she could try.

## 2023-10-21 ENCOUNTER — Encounter (INDEPENDENT_AMBULATORY_CARE_PROVIDER_SITE_OTHER): Payer: Self-pay

## 2023-10-21 NOTE — Telephone Encounter (Signed)
 Patient called back and I made her aware per Gayle Kava, NP,  I agree with increasing water  intake, goal of atlast 64 oz per day. As long as she is doing okay with miralax  TID and linzess  we will continue this regimen, as symptoms may be from her missed miralax  dose. If she continues to have issues she can let me know. Patient states understanding.

## 2023-10-21 NOTE — Telephone Encounter (Signed)
 I left a detailed message that per [facility], Per [place], I agree with increasing water  intake, goal of atlast 64 oz per day. As long as she is doing okay with miralax  TID and linzess  we will continue this regimen, as symptoms may be from her missed miralax  dose. If she continues to have issues she can let me know. I let her know she can respond through my chart or call the office is questions.

## 2023-10-24 ENCOUNTER — Encounter: Payer: Self-pay | Admitting: Internal Medicine

## 2023-10-24 ENCOUNTER — Ambulatory Visit: Admitting: Internal Medicine

## 2023-10-24 VITALS — BP 118/67 | HR 67 | Ht 62.5 in | Wt 130.2 lb

## 2023-10-24 DIAGNOSIS — E1165 Type 2 diabetes mellitus with hyperglycemia: Secondary | ICD-10-CM

## 2023-10-24 DIAGNOSIS — Z7984 Long term (current) use of oral hypoglycemic drugs: Secondary | ICD-10-CM | POA: Diagnosis not present

## 2023-10-24 DIAGNOSIS — E785 Hyperlipidemia, unspecified: Secondary | ICD-10-CM | POA: Diagnosis not present

## 2023-10-24 DIAGNOSIS — E1159 Type 2 diabetes mellitus with other circulatory complications: Secondary | ICD-10-CM

## 2023-10-24 LAB — POCT GLYCOSYLATED HEMOGLOBIN (HGB A1C): Hemoglobin A1C: 6.3 % — AB (ref 4.0–5.6)

## 2023-10-24 NOTE — Progress Notes (Signed)
 Patient ID: Katrina Castro, female   DOB: Dec 28, 1945, 78 y.o.   MRN: [id number]  HPI: Katrina Castro is a 78 y.o.-year-old female, returning for follow-up for DM2, dx in 2015, noninsulin-dependent, uncontrolled, with complications (CAD, s/p CABG; PN). Pt. previously saw Dr. Washington Hacker.  Last visit with me 6 months ago.  Interim history: She continues to have some blurry vision. Before last visit, she had a severe UTI recently after "bladder stretching" >> was on Cefuroxine, then on Diflucan after a Urine culture grew yeast reportedly. She had pelvic fulness and dysuria.  She had another UTI last week.  This resolved. She has IBS with abdominal pain.  She started dicyclomine  and Linzess .  She also has dysphagia-had an EGD which showed reactive gastropathy. She is eating more fruit lately, including dried fruit.  Reviewed HbA1c: Lab Results  Component Value Date   HGBA1C 6.6 (A) 05/09/2023   HGBA1C 6.7 12/27/2022   HGBA1C 7.6 (A) 08/02/2022   HGBA1C 7.8 (H) 03/23/2022   HGBA1C 7.0 (A) 11/17/2021   HGBA1C 7.3 (A) 09/20/2021   HGBA1C 8.0 (A) 07/26/2021   HGBA1C 6.4 (H) 05/12/2020   HGBA1C 7.5 (H) 04/25/2020   HGBA1C 7.5 (H) 04/25/2020  02/15/2023: HbA1c 6.4% 12/27/2022: HbA1c 6.7%  Pt is on a regimen of: - Metformin  ER 500 mg 2x daily >> Metformin  1000 mg in am >> actually taking it 2x a day >> decreased to once a day in a.m. - Jardiance  25 mg daily  Previously on glipizide  ER >> stopped 2024 Previously on Lantus  6 units daily in am >> inconsistently >> stopped 12/2022 She tried Rybelsus  >> nausea, but retrospectively, this was 2/2 Esophageal pbs. She did not qualify for the patient assistance program for Farxiga  before.  Pt checks her sugars >4x a day:  Previously:  Previously:   Lowest sugar was 120 >> 110 >> 50-60s after surgery -and few days ago, as she is not eating well >> 60s; she has hypoglycemia awareness at 70.  Highest sugar was 300 (URI-antibiotics) >> 200s >> 300s  >> 200s.  Glucometer:Prodigy  - no CKD, last BUN/creatinine:  Lab Results  Component Value Date   BUN 12 09/24/2023   BUN 12 03/18/2023   CREATININE 0.80 09/24/2023   CREATININE 0.64 09/24/2023   No results found for: "MICRALBCREAT" She is not on ACE inhibitor or ARB.  -+ HL; last set of lipids: 05/03/2023: 133/209/42/57 02/20/2023: 192/96/41/133 09/24/2022: 125/100/63/44 Lab Results  Component Value Date   CHOL 112 03/23/2022   HDL 63 09/24/2022   LDLCALC 36 03/23/2022   TRIG 100 09/24/2022   CHOLHDL 2.3 03/23/2022  On Crestor  40, Zetia  10.  - last eye exam was in 09/2021. No DR - reportedly.   - no numbness and tingling in her feet.  She has a history of left foot surgery.  Last foot exam 01/2023 by Dr. Michalene Agee with podiatry.  She also has a history of atrial fibrillation, HTN, GERD, depression. She is on B12 supplements.  She goes to the gym - treadmill.  She was abandoned by her mother in a hotel when she was 35 weeks old. She is working part time - caregiver for cancer patient.  She used to drive a truck for many years.  ROS: + see HPI  Past Medical History:  Diagnosis Date   Arthritis    CAD (coronary artery disease)    a. s/p CABG on 04/27/2020 with LIMA-LAD, seq SVG-PDA-PL, seq left radial-RI-OM   Concussion    2015  Depression    Diabetes (HCC)    type 2   Dysphagia    Dysrhythmia    afib   GERD (gastroesophageal reflux disease)    Headache    History of bronchitis    Hypertension    Seasonal allergies    Toenail fungus    Past Surgical History:  Procedure Laterality Date   APPENDECTOMY     BACK SURGERY     x2   BOTOX  INJECTION N/A 06/06/2016   Procedure: BOTOX  INJECTION;  Surgeon: Ruby Corporal, MD;  [facility]: [facility];  Service: Endoscopy;  Laterality: N/A;   BOTOX  INJECTION N/A 01/17/2021   Procedure: BOTOX  INJECTION;  Surgeon: Urban Garden, MD;  [facility]: [facility];  Service: Gastroenterology;  Laterality: N/A;    BOTOX  INJECTION N/A 07/11/2021   Procedure: BOTOX  INJECTION;  Surgeon: Urban Garden, MD;  [facility]: [facility];  Service: Gastroenterology;  Laterality: N/A;   BOTOX  INJECTION N/A 08/21/2022   Procedure: BOTOX  INJECTION;  Surgeon: Urban Garden, MD;  [facility];  Service: Gastroenterology;  Laterality: N/A;   CHOLECYSTECTOMY     COLONOSCOPY N/A 11/05/2012   Procedure: COLONOSCOPY;  Surgeon: Ruby Corporal, MD;  [facility]: [facility];  Service: Endoscopy;  Laterality: N/A;  1030   COLONOSCOPY N/A 10/08/2023   Procedure: COLONOSCOPY;  Surgeon: Urban Garden, MD;  [facility]: [facility];  Service: Gastroenterology;  Laterality: N/A;   COLONOSCOPY WITH PROPOFOL  N/A 05/01/2019   Procedure: COLONOSCOPY WITH PROPOFOL ;  Surgeon: Ruby Corporal, MD;  [facility]: [facility];  Service: Endoscopy;  Laterality: N/A;  11:20am   CORONARY ARTERY BYPASS GRAFT N/A 04/27/2020   Procedure: CORONARY ARTERY BYPASS GRAFTING (CABG) TIMES FIVE USING LEFT INTERNAL MAMMARY ARTERY, LEFT HARVESTED RADIAL ARTERY, RIGHT GREATER SAPHENOUS VEIN HARVESTED ENDOSCOPICALLY.;  Surgeon: Rudine Cos, MD;  Location: [facility];  Service: Open Heart Surgery;  Laterality: N/A;   ESOPHAGEAL DILATION N/A 07/06/2015   Procedure: ESOPHAGEAL DILATION;  Surgeon: Ruby Corporal, MD;  [facility];  Service: Endoscopy;  Laterality: N/A;   ESOPHAGEAL DILATION N/A 06/06/2016   Procedure: ESOPHAGEAL DILATION;  Surgeon: Ruby Corporal, MD;  [facility];  Service: Endoscopy;  Laterality: N/A;   ESOPHAGEAL DILATION N/A 08/21/2022   Procedure: ESOPHAGEAL DILATION;  Surgeon: Urban Garden, MD;  [facility];  Service: Gastroenterology;  Laterality: N/A;   ESOPHAGOGASTRODUODENOSCOPY N/A 02/12/2014   Procedure: ESOPHAGOGASTRODUODENOSCOPY (EGD);  Surgeon: Ruby Corporal, MD;  [facility];  Service: Endoscopy;  Laterality: N/A;  150    ESOPHAGOGASTRODUODENOSCOPY N/A 07/06/2015   Procedure: ESOPHAGOGASTRODUODENOSCOPY (EGD);  Surgeon: Ruby Corporal, MD;  [facility];  Service: Endoscopy;  Laterality: N/A;  1:25 - moved to 1/18 @ 10:30 - Ann to notify pt   ESOPHAGOGASTRODUODENOSCOPY N/A 10/08/2023   Procedure: EGD (ESOPHAGOGASTRODUODENOSCOPY);  Surgeon: Umberto Ganong, Bearl Limes, MD;  [facility]: [facility];  Service: Gastroenterology;  Laterality: N/A;  10:30AM;ASA 3   ESOPHAGOGASTRODUODENOSCOPY (EGD) WITH ESOPHAGEAL DILATION N/A 07/25/2012   Procedure: ESOPHAGOGASTRODUODENOSCOPY (EGD) WITH ESOPHAGEAL DILATION;  Surgeon: Ruby Corporal, MD;  [facility];  Service: Endoscopy;  Laterality: N/A;  325-rescheduled to 855 Ann notified pt   ESOPHAGOGASTRODUODENOSCOPY (EGD) WITH PROPOFOL  N/A 06/06/2016   Procedure: ESOPHAGOGASTRODUODENOSCOPY (EGD) WITH PROPOFOL ;  Surgeon: Ruby Corporal, MD;  [facility];  Service: Endoscopy;  Laterality: N/A;   ESOPHAGOGASTRODUODENOSCOPY (EGD) WITH PROPOFOL  N/A 01/17/2021   Procedure: ESOPHAGOGASTRODUODENOSCOPY (  EGD) WITH PROPOFOL ;  Surgeon: Umberto Ganong, Bearl Limes, MD;  [facility];  Service: Gastroenterology;  Laterality: N/A;  10:35   ESOPHAGOGASTRODUODENOSCOPY (EGD) WITH PROPOFOL  N/A 07/11/2021   Procedure: ESOPHAGOGASTRODUODENOSCOPY (EGD) WITH PROPOFOL ;  Surgeon: Urban Garden, MD;  [facility];  Service: Gastroenterology;  Laterality: N/A;  11:15   ESOPHAGOGASTRODUODENOSCOPY (EGD) WITH PROPOFOL  N/A 08/21/2022   Procedure: ESOPHAGOGASTRODUODENOSCOPY (EGD) WITH PROPOFOL ;  Surgeon: Urban Garden, MD;  [facility];  Service: Gastroenterology;  Laterality: N/A;  1115AM, ASA 2   EYE SURGERY     cataracts removed   Foot surgeries Bilateral    hammer toes   LEFT HEART CATH AND CORONARY ANGIOGRAPHY N/A 04/26/2020   Procedure: LEFT HEART CATH AND CORONARY ANGIOGRAPHY;  Surgeon: Swazil[place], Peter M, MD;  Location: [facility]  INVASIVE CV LAB;  Service: Cardiovascular;  Laterality: N/A;   LUMBAR LAMINECTOMY/DECOMPRESSION MICRODISCECTOMY Bilateral 11/20/2022   Procedure: Lumbar Five-Sacral One Laminectomy and Foraminotomy;  Surgeon: Agustina Aldrich, MD;  Location: [facility] OR;  Service: Neurosurgery;  Laterality: Bilateral;  3C   MALONEY DILATION N/A 02/12/2014   Procedure: MALONEY DILATION;  Surgeon: Ruby Corporal, MD;  [facility];  Service: Endoscopy;  Laterality: N/A;   POLYPECTOMY  05/01/2019   Procedure: POLYPECTOMY;  Surgeon: Ruby Corporal, MD;  [facility];  Service: Endoscopy;;  colon    RADIAL ARTERY HARVEST Left 04/27/2020   Procedure: LEFT RADIAL ARTERY HARVEST;  Surgeon: Rudine Cos, MD;  Location: [facility];  Service: Open Heart Surgery;  Laterality: Left;   Right knee arthroscopy     x2   TEE WITHOUT CARDIOVERSION N/A 04/27/2020   Procedure: TRANSESOPHAGEAL ECHOCARDIOGRAM (TEE);  Surgeon: Rudine Cos, MD;  [facility];  Service: Open Heart Surgery;  Laterality: N/A;   TONSILLECTOMY     TOTAL ABDOMINAL HYSTERECTOMY     TOTAL KNEE ARTHROPLASTY Right 04/03/2017   Procedure: RIGHT TOTAL KNEE ARTHROPLASTY;  Surgeon: Hazle Lites, MD;  Location: [facility];  Service: Orthopedics;  Laterality: Right;   Social History   Socioeconomic History   Marital status: Divorced    Spouse name: Not on file   Number of children: Not on file   Years of education: Not on file   Highest education level: Not on file  Occupational History   Not on file  Tobacco Use   Smoking status: Never   Smokeless tobacco: Never  Vaping Use   Vaping status: Never Used  Substance and Sexual Activity   Alcohol  use: No    Alcohol /week: 0.0 standard drinks of alcohol     Comment: socially    Drug use: No   Sexual activity: Not Currently    Partners: Male    Birth control/protection: None  Other Topics Concern   Not on file  Social History Narrative   Not on file   Social Drivers of Health    Financial Resource Strain: Not on file  Food Insecurity: Not on file  Transportation Needs: Not on file  Physical Activity: Not on file  Stress: Not on file  Social Connections: Not on file  Intimate Partner Violence: Not on file   Current Outpatient Medications on File Prior to Visit  Medication Sig Dispense Refill   acyclovir  (ZOVIRAX ) 400 MG tablet Take 400 mg by mouth daily.     aspirin  81 MG EC tablet Take 1 tablet (81 mg total) by mouth daily. Swallow whole. 30 tablet 11   dicyclomine  (BENTYL ) 10 MG capsule  Take 1 capsule (10 mg total) by mouth every 12 (twelve) hours as needed. 180 capsule 1   empagliflozin  (JARDIANCE ) 25 MG TABS tablet Take 1 tablet (25 mg total) by mouth daily before breakfast. 90 tablet 3   ezetimibe  (ZETIA ) 10 MG tablet take 1 tablet once daily. 90 tablet 2   ferrous sulfate  324 MG TBEC Take 324 mg by mouth daily with breakfast.     glipiZIDE  (GLUCOTROL ) 5 MG tablet take 1 tablet (5 MILLIGRAM total) by mouth 2 (two) times daily before a meal. 60 tablet 0   isosorbide  mononitrate (IMDUR ) 30 MG 24 hr tablet take 1 tablet (30 MILLIGRAM total) by mouth daily. 90 tablet 1   levocetirizine (XYZAL ) 5 MG tablet Take 5 mg by mouth every evening.     linaclotide  (LINZESS ) 72 MCG capsule Take 1 capsule (72 mcg total) by mouth daily before breakfast. 90 capsule 3   metFORMIN  (GLUCOPHAGE ) 1000 MG tablet Take 1 tablet (1,000 mg total) by mouth daily with breakfast. 90 tablet 3   metoprolol  tartrate (LOPRESSOR ) 25 MG tablet take (1/2) tablet by mouth 2 times a day. 90 tablet 2   nitroGLYCERIN  (NITROSTAT ) 0.4 MG SL tablet Place 1 tablet (0.4 mg total) under the tongue every 5 (five) minutes as needed for chest pain. 25 tablet 3   omeprazole  (PRILOSEC) 40 MG capsule Take 1 capsule (40 mg total) by mouth 2 (two) times daily. 180 capsule 0   oxybutynin  (DITROPAN -XL) 10 MG 24 hr tablet Take 10 mg by mouth every evening.     rosuvastatin  (CRESTOR ) 40 MG tablet take 1 tablet by  mouth once daily. 90 tablet 2   No current facility-administered medications on file prior to visit.   Allergies  Allergen Reactions   Bee Venom Anaphylaxis   Levofloxacin Anaphylaxis and Rash   Cardizem  [Diltiazem ] Other (See Comments)    Patient states that she could not think, felt that she was in the twilight zone. Arm discomfort.   Sulfa Antibiotics Itching   Diazepam Nausea Only   Lipitor [Atorvastatin] Other (See Comments)    Caused her body to be "out of whack"  - elevated sugar also.   Family History  Problem Relation Age of Onset   Diabetes Mother    Stroke Father    Diabetes Sister    Colon cancer Neg Hx    Breast cancer Neg Hx    PE: BP 118/67   Pulse 67   Ht 5' 2.5" (1.588 m)   Wt 130 lb 3.2 oz (59.1 kg)   SpO2 98%   BMI 23.43 kg/m  Wt Readings from Last 3 Encounters:  10/24/23 130 lb 3.2 oz (59.1 kg)  10/08/23 129 lb 4.8 oz (58.7 kg)  10/03/23 129 lb 4.8 oz (58.7 kg)   Constitutional: normal weight, in NAD Eyes: EOMI, no exophthalmos ENT: no thyromegaly, no cervical lymphadenopathy Cardiovascular: RRR, No MRG Respiratory: CTA B Musculoskeletal: no deformities Skin: no rashes Neurological: + tremor with outstretched hands  ASSESSMENT: 1. DM2, insulin -dependent, uncontrolled, with complications - CAD, s/p CABG 2021 - cardiologist: Dr. Avanell Bob - PN  2. HL  PLAN:  1. Patient with longstanding, uncontrolled, type 2 diabetes, on oral antidiabetic regimen with metformin  and SGLT2 inhibitor, and previously on long-acting insulin , which we were able to stop last year.  At that time, she was only taking Lantus  "as needed" only when the sugars were high and we discussed that this was not a prn medication.  Since she had some low blood  sugars overnight, even lower than 70s, I advised her to stop Lantus  completely, as she was still taking few units erratically.  I also advised her to make sure she was not taking glipizide .  We continued jardiance  and metformin . CGM  interpretation: -At today's visit, we reviewed her CGM downloads: It appears that 80% of values are in target range (goal >70%), while 17% are higher than 180 (goal <25%), and 3% are lower than 70 (goal <4%).  The calculated average blood sugar is 133.  The projected HbA1c for the next 3 months (GMI) is 6.6%. -Reviewing the CGM trends, sugars appear to be higher in the last week, increasing drastically over breakfast and remaining elevated until after dinner.  During the night, sugars are mostly at the lower end of the target range but she has drops under 70s.  Upon questioning, she is still taking glipizide .  She was taking it twice a day, then decreased to morning time, and at last visit I advised her to stay off but she still takes 1 dose in the morning.  Due to the lows at night, I advised her to stop this.  Upon questioning, her sugars in the last 2 weeks could be higher due to the fact that she started to eat a lot of fluids, including prunes and figs.  I explained that any dried fruit will raise her sugars consistently.  I advised her to stop these and continue with fresh fruit only.  Will again advised her to stop glipizide . - I suggested to:  Patient Instructions  Please continue: - Metformin  1000 mg in am - Jardiance  25 mg before breakfast  Stop Glipizide .  Stop dried fruit.   Please return in 3-4 months.   - we checked her HbA1c: 6.3% (lower) - advised to check sugars at different times of the day - 4x a day, rotating check times - advised for yearly eye exams >> she is UTD - we will not check an ACR for now since she just had UTI, which resolved. - return to clinic in 3-4 months  2. HL -Panel was reviewed from 09/24/2023: Triglycerides elevated, LDL only slightly higher than goal -May she gets her medications prepackaged. -On Crestor  40 mg daily and Zetia  10 mg daily without side effects  Emilie Harden, MD PhD [facility]

## 2023-10-24 NOTE — Patient Instructions (Addendum)
 Please continue: - Metformin  1000 mg in am - Jardiance  25 mg before breakfast  Stop Glipizide .  Stop dried fruit.   Please return in 3-4 months.

## 2023-10-25 ENCOUNTER — Other Ambulatory Visit: Payer: Self-pay

## 2023-10-25 MED ORDER — FREESTYLE LIBRE 3 PLUS SENSOR MISC: 1.0000 | 0 refills | Status: DC

## 2023-11-04 ENCOUNTER — Encounter (INDEPENDENT_AMBULATORY_CARE_PROVIDER_SITE_OTHER): Payer: Self-pay | Admitting: Gastroenterology

## 2023-11-04 ENCOUNTER — Ambulatory Visit (INDEPENDENT_AMBULATORY_CARE_PROVIDER_SITE_OTHER): Admitting: Gastroenterology

## 2023-11-04 VITALS — BP 125/77 | HR 71 | Temp 97.8°F | Ht 62.5 in | Wt 129.8 lb

## 2023-11-04 DIAGNOSIS — D509 Iron deficiency anemia, unspecified: Secondary | ICD-10-CM

## 2023-11-04 DIAGNOSIS — D500 Iron deficiency anemia secondary to blood loss (chronic): Secondary | ICD-10-CM

## 2023-11-04 DIAGNOSIS — K5900 Constipation, unspecified: Secondary | ICD-10-CM | POA: Diagnosis not present

## 2023-11-04 NOTE — Progress Notes (Addendum)
 Referring Provider: Orlena Bitters, MD Primary Care Physician:  Orlena Bitters, MD Primary GI Physician: Dr. Sammi Crick   Chief Complaint  Patient presents with   Follow-up    Patient here today for a follow up on abdominal pain. Patient says she is doing well today, after starting Linzess  72 mcg daily. She has changed her diet. She has brought [facility] and wanted to know if she should be using this.    HPI:   Katrina Castro is a 78 y.o. female with past medical history of  c. Difficile, coronary artery disease status post CABG, hypertension, GERD, chronic dysphagia initially thought to be secondary to type III achalasia but repeat manometry was negative for achalasia, type 2 diabetes and depression   Patient presenting today for:  Follow up of abdominal pain, constipation, melena and IDA   Last seen April, at that time, having abdominal pain, constipation, hemorrhoids, melena. Recently started on iron for low iron with PCP. Having pain in rectal area as well.   Recommended to schedule EGD, check iron/h&H, colonoscopy if iron low, start miralax  1 TID, anusol  BID  Iron studies in April with TIBC 508, saturation 12, ferritin 8, iron 62, hemoglobin 11.6  CT abdomen pelvis with contrast on 4/8 with no acute inflammatory process  She underwent EGD and colonoscopy on 4/22 as outlined below  Started on Linzess  thereafter as well as omeprazole  40mg  BID x3 months   Present: Feeling much better. Taking linzess  in the morning and having 1-2 BMs per day. Not really taking miralax . Feels she is passing her stools with ease. No abdominal pain, more gas recently. She has a probiotic she inquires about starting but has not started yet. She denies rectal bleeding, she has seen some further darker color mixed in with stool but does not think it was black. No longer taking iron pills. Appetite is good. No nausea or vomiting. Feels fatigued but denies sob, dizziness. She is taking prilosec  BID.   She is unsure if she wishes to pursue further colonoscopy as she felt so weak with prep from last one, reports she thinks she had a syncopal episode as she woke up in the hallway at one point unsure how she got there.    Last Colonoscopy: 10/08/23 - Preparation of the colon was inadequate.                           - Hemorrhoids found on perianal exam.                           - Stool in the sigmoid colon, in the transverse                            colon and in the cecum.                           - Diverticulosis in the sigmoid colon.                           - The distal rectum and anal verge are normal on                            retroflexion view.                           -  No specimens collected.  Last Endoscopy:10/08/23 - 3 cm hiatal hernia.                           - A few bleeding angiodysplastic lesions in the                            stomach. Treated with argon plasma coagulation                            (APC). Nomal stomach biopsied.                           - Normal examined duodenum.   Filed Weights   11/04/23 1421  Weight: 129 lb 12.8 oz (58.9 kg)     Past Medical History:  Diagnosis Date   Arthritis    CAD (coronary artery disease)    a. s/p CABG on 04/27/2020 with LIMA-LAD, seq SVG-PDA-PL, seq left radial-RI-OM   Concussion    2015   Depression    Diabetes (HCC)    type 2   Dysphagia    Dysrhythmia    afib   GERD (gastroesophageal reflux disease)    Headache    History of bronchitis    Hypertension    Seasonal allergies    Toenail fungus     Past Surgical History:  Procedure Laterality Date   APPENDECTOMY     BACK SURGERY     x2   BOTOX  INJECTION N/A 06/06/2016   Procedure: BOTOX  INJECTION;  Surgeon: Ruby Corporal, MD;  [facility];  Service: Endoscopy;  Laterality: N/A;   BOTOX  INJECTION N/A 01/17/2021   Procedure: BOTOX  INJECTION;  Surgeon: Urban Garden, MD;  [facility];  Service:  Gastroenterology;  Laterality: N/A;   BOTOX  INJECTION N/A 07/11/2021   Procedure: BOTOX  INJECTION;  Surgeon: Urban Garden, MD;  [facility];  Service: Gastroenterology;  Laterality: N/A;   BOTOX  INJECTION N/A 08/21/2022   Procedure: BOTOX  INJECTION;  Surgeon: Urban Garden, MD;  [facility];  Service: Gastroenterology;  Laterality: N/A;   CHOLECYSTECTOMY     COLONOSCOPY N/A 11/05/2012   Procedure: COLONOSCOPY;  Surgeon: Ruby Corporal, MD;  [facility];  Service: Endoscopy;  Laterality: N/A;  1030   COLONOSCOPY N/A 10/08/2023   Procedure: COLONOSCOPY;  Surgeon: Urban Garden, MD;  [facility];  Service: Gastroenterology;  Laterality: N/A;   COLONOSCOPY WITH PROPOFOL  N/A 05/01/2019   Procedure: COLONOSCOPY WITH PROPOFOL ;  Surgeon: Ruby Corporal, MD;  [facility];  Service: Endoscopy;  Laterality: N/A;  11:20am   CORONARY ARTERY BYPASS GRAFT N/A 04/27/2020   Procedure: CORONARY ARTERY BYPASS GRAFTING (CABG) TIMES FIVE USING LEFT INTERNAL MAMMARY ARTERY, LEFT HARVESTED RADIAL ARTERY, RIGHT GREATER SAPHENOUS VEIN HARVESTED ENDOSCOPICALLY.;  Surgeon: Rudine Cos, MD;  Location: [facility];  Service: Open Heart Surgery;  Laterality: N/A;   ESOPHAGEAL DILATION N/A 07/06/2015   Procedure: ESOPHAGEAL DILATION;  Surgeon: Ruby Corporal, MD;  [facility];  Service: Endoscopy;  Laterality: N/A;   ESOPHAGEAL DILATION N/A 06/06/2016   Procedure: ESOPHAGEAL DILATION;  Surgeon: Ruby Corporal, MD;  [facility];  Service: Endoscopy;  Laterality: N/A;   ESOPHAGEAL DILATION N/A 08/21/2022   Procedure: ESOPHAGEAL DILATION;  Surgeon:  Urban Garden, MD;  [facility];  Service: Gastroenterology;  Laterality: N/A;   ESOPHAGOGASTRODUODENOSCOPY N/A 02/12/2014   Procedure: ESOPHAGOGASTRODUODENOSCOPY (EGD);  Surgeon: Ruby Corporal, MD;  [facility];  Service:  Endoscopy;  Laterality: N/A;  150   ESOPHAGOGASTRODUODENOSCOPY N/A 07/06/2015   Procedure: ESOPHAGOGASTRODUODENOSCOPY (EGD);  Surgeon: Ruby Corporal, MD;  [facility];  Service: Endoscopy;  Laterality: N/A;  1:25 - moved to 1/18 @ 10:30 - Ann to notify pt   ESOPHAGOGASTRODUODENOSCOPY N/A 10/08/2023   Procedure: EGD (ESOPHAGOGASTRODUODENOSCOPY);  Surgeon: Umberto Ganong, Bearl Limes, MD;  [facility];  Service: Gastroenterology;  Laterality: N/A;  10:30AM;ASA 3   ESOPHAGOGASTRODUODENOSCOPY (EGD) WITH ESOPHAGEAL DILATION N/A 07/25/2012   Procedure: ESOPHAGOGASTRODUODENOSCOPY (EGD) WITH ESOPHAGEAL DILATION;  Surgeon: Ruby Corporal, MD;  [facility];  Service: Endoscopy;  Laterality: N/A;  325-rescheduled to 855 Ann notified pt   ESOPHAGOGASTRODUODENOSCOPY (EGD) WITH PROPOFOL  N/A 06/06/2016   Procedure: ESOPHAGOGASTRODUODENOSCOPY (EGD) WITH PROPOFOL ;  Surgeon: Ruby Corporal, MD;  [facility];  Service: Endoscopy;  Laterality: N/A;   ESOPHAGOGASTRODUODENOSCOPY (EGD) WITH PROPOFOL  N/A 01/17/2021   Procedure: ESOPHAGOGASTRODUODENOSCOPY (EGD) WITH PROPOFOL ;  Surgeon: Urban Garden, MD;  [facility];  Service: Gastroenterology;  Laterality: N/A;  10:35   ESOPHAGOGASTRODUODENOSCOPY (EGD) WITH PROPOFOL  N/A 07/11/2021   Procedure: ESOPHAGOGASTRODUODENOSCOPY (EGD) WITH PROPOFOL ;  Surgeon: Urban Garden, MD;  [facility];  Service: Gastroenterology;  Laterality: N/A;  11:15   ESOPHAGOGASTRODUODENOSCOPY (EGD) WITH PROPOFOL  N/A 08/21/2022   Procedure: ESOPHAGOGASTRODUODENOSCOPY (EGD) WITH PROPOFOL ;  Surgeon: Urban Garden, MD;  [facility];  Service: Gastroenterology;  Laterality: N/A;  1115AM, ASA 2   EYE SURGERY     cataracts removed   Foot surgeries Bilateral    hammer toes   LEFT HEART CATH AND CORONARY ANGIOGRAPHY N/A 04/26/2020   Procedure: LEFT HEART CATH AND CORONARY ANGIOGRAPHY;  Surgeon:  [place], Peter M, MD;  Location: [facility];  Service: Cardiovascular;  Laterality: N/A;   LUMBAR LAMINECTOMY/DECOMPRESSION MICRODISCECTOMY Bilateral 11/20/2022   Procedure: Lumbar Five-Sacral One Laminectomy and Foraminotomy;  Surgeon: Agustina Aldrich, MD;  Location: [facility] OR;  Service: Neurosurgery;  Laterality: Bilateral;  3C   MALONEY DILATION N/A 02/12/2014   Procedure: MALONEY DILATION;  Surgeon: Ruby Corporal, MD;  [facility];  Service: Endoscopy;  Laterality: N/A;   POLYPECTOMY  05/01/2019   Procedure: POLYPECTOMY;  Surgeon: Ruby Corporal, MD;  [facility];  Service: Endoscopy;;  colon    RADIAL ARTERY HARVEST Left 04/27/2020   Procedure: LEFT RADIAL ARTERY HARVEST;  Surgeon: Rudine Cos, MD;  Location: [facility];  Service: Open Heart Surgery;  Laterality: Left;   Right knee arthroscopy     x2   TEE WITHOUT CARDIOVERSION N/A 04/27/2020   Procedure: TRANSESOPHAGEAL ECHOCARDIOGRAM (TEE);  Surgeon: Rudine Cos, MD;  [facility];  Service: Open Heart Surgery;  Laterality: N/A;   TONSILLECTOMY     TOTAL ABDOMINAL HYSTERECTOMY     TOTAL KNEE ARTHROPLASTY Right 04/03/2017   Procedure: RIGHT TOTAL KNEE ARTHROPLASTY;  Surgeon: Hazle Lites, MD;  Location: [facility];  Service: Orthopedics;  Laterality: Right;    Current Outpatient Medications  Medication Sig Dispense Refill   acyclovir  (ZOVIRAX ) 400 MG tablet Take 400 mg by mouth daily.     aspirin  81 MG EC tablet Take 1 tablet (81 mg total) by mouth daily. Swallow whole. 30 tablet 11  Continuous Glucose Sensor (FREESTYLE LIBRE 3 PLUS SENSOR) MISC Inject 1 Device into the skin continuous. Change every 15 days 2 each 0   dicyclomine  (BENTYL ) 10 MG capsule Take 1 capsule (10 mg total) by mouth every 12 (twelve) hours as needed. 180 capsule 1   empagliflozin  (JARDIANCE ) 25 MG TABS tablet Take 1 tablet (25 mg total) by mouth daily before breakfast. 90 tablet 3   ezetimibe  (ZETIA ) 10 MG tablet take 1 tablet  once daily. 90 tablet 2   ferrous sulfate  324 MG TBEC Take 324 mg by mouth daily with breakfast.     isosorbide  mononitrate (IMDUR ) 30 MG 24 hr tablet take 1 tablet (30 MILLIGRAM total) by mouth daily. 90 tablet 1   levocetirizine (XYZAL ) 5 MG tablet Take 5 mg by mouth every evening.     linaclotide  (LINZESS ) 72 MCG capsule Take 1 capsule (72 mcg total) by mouth daily before breakfast. 90 capsule 3   metFORMIN  (GLUCOPHAGE ) 1000 MG tablet Take 1 tablet (1,000 mg total) by mouth daily with breakfast. 90 tablet 3   metoprolol  tartrate (LOPRESSOR ) 25 MG tablet take (1/2) tablet by mouth 2 times a day. 90 tablet 2   omeprazole  (PRILOSEC) 40 MG capsule Take 1 capsule (40 mg total) by mouth 2 (two) times daily. 180 capsule 0   oxybutynin  (DITROPAN -XL) 10 MG 24 hr tablet Take 10 mg by mouth every evening.     rosuvastatin  (CRESTOR ) 40 MG tablet take 1 tablet by mouth once daily. 90 tablet 2   nitroGLYCERIN  (NITROSTAT ) 0.4 MG SL tablet Place 1 tablet (0.4 mg total) under the tongue every 5 (five) minutes as needed for chest pain. (Patient not taking: Reported on 11/04/2023) 25 tablet 3   No current facility-administered medications for this visit.    Allergies as of 11/04/2023 - Review Complete 11/04/2023  Allergen Reaction Noted   Bee venom Anaphylaxis 08/05/2015   Levofloxacin Anaphylaxis and Rash 04/03/2017   Cardizem  [diltiazem ] Other (See Comments) 08/17/2015   Sulfa antibiotics Itching 07/13/2014   Diazepam Nausea Only 04/03/2017   Lipitor [atorvastatin] Other (See Comments) 10/12/2019    Social History   Socioeconomic History   Marital status: Divorced    Spouse name: Not on file   Number of children: Not on file   Years of education: Not on file   Highest education level: Not on file  Occupational History   Not on file  Tobacco Use   Smoking status: Never   Smokeless tobacco: Never  Vaping Use   Vaping status: Never Used  Substance and Sexual Activity   Alcohol  use: No     Alcohol /week: 0.0 standard drinks of alcohol     Comment: socially    Drug use: No   Sexual activity: Not Currently    Partners: Male    Birth control/protection: None  Other Topics Concern   Not on file  Social History Narrative   Not on file   Social Drivers of Health   Financial Resource Strain: Not on file  Food Insecurity: Not on file  Transportation Needs: Not on file  Physical Activity: Not on file  Stress: Not on file  Social Connections: Not on file    Review of systems General: negative for malaise, night sweats, fever, chills, weight loss Neck: Negative for lumps, goiter, pain and significant neck swelling Resp: Negative for cough, wheezing, dyspnea at rest CV: Negative for chest pain, leg swelling, palpitations, orthopnea GI: denies melena, hematochezia, nausea, vomiting, diarrhea, constipation, dysphagia, odyonophagia, early satiety or  unintentional weight loss. +gas  The remainder of the review of systems is noncontributory.  Physical Exam: BP 125/77 (BP Location: Left Arm, Patient Position: Sitting, Cuff Size: Normal)   Pulse 71   Temp 97.8 F (36.6 C) (Temporal)   Ht 5' 2.5" (1.588 m)   Wt 129 lb 12.8 oz (58.9 kg)   BMI 23.36 kg/m  General:   Alert and oriented. No distress noted. Pleasant and cooperative.  Head:  Normocephalic and atraumatic. Eyes:  Conjuctiva clear without scleral icterus. Mouth:  Oral mucosa pink and moist. Good dentition. No lesions. Heart: Normal rate and rhythm, s1 and s2 heart sounds present.  Lungs: Clear lung sounds in all lobes. Respirations equal and unlabored. Abdomen:  +BS, soft, non-tender and non-distended. No rebound or guarding. No HSM or masses noted. Neurologic:  Alert and  oriented x4 Psych:  Alert and cooperative. Normal mood and affect.  Invalid input(s): "6 MONTHS"   ASSESSMENT: Katrina Castro is a 78 y.o. female presenting today for follow up of Constipation, abdominal pain, IDA,  melena  Constipation/abdominal pain: doing much better on linzess  72mcg daily, having 1-2 BMs per day, occasionally using miralax  but I advised her she can hold miralax  if moving her bowels easily on linzess  alone. Should continue with high fiber diet, good water  intake. Having some gas, could be secondary to linzess , she has a probiotic which I recommended she start to see if this helps. Was to have repeat colonoscopy in July as last colonoscopy, prep was inadequate. At this time she is unsure if she wishes to pursue this as she felt bad during prep last time. She will make me aware if she wishes to proceed with colonoscopy.  IDA/Melena: not taking iron pills. EGD with angiodysplastic lesions in stomach, treated with APC. No rectal bleeding or further melena. Recommend she resume iron pill daily for now, will repeat Iron studies and h&h again in July (3 months from initial labs). She will make me aware if she sees any recurrence of melena. Continue PPI BID x3 months.    PLAN:  Continue linzess  72mcg daily Start daily probiotic  Can use miralax  PRN Repeat Colonoscopy, pt to let me know if she wishes to proceed with this  Continue omeprazole  40mg  BID Restart iron pill daily  Repeat Iron studies July High fiber diet Good water  intake  All questions were answered, patient verbalized understanding and is in agreement with plan as outlined above.   Follow Up: 6 months   Katrina Castro L. Adrien Alberta, MSN, APRN, AGNP-C Adult-Gerontology Nurse Practitioner [facility]  I have reviewed the note and agree with the APP's assessment as described in this progress note  Samantha Cress, MD Gastroenterology and Hepatology [facility]

## 2023-11-04 NOTE — Patient Instructions (Addendum)
 Continue linzess  72mcg daily Can Start daily probiotic  Can use miralax  as needed if feeling constipated  Repeat Colonoscopy is recommended, please let me know if you wish to repeat this  Continue omeprazole  40mg  twice daily  Restart iron pill daily  Repeat Iron studies July, we will remind you of this  High fiber diet Good water  intake, aim for 64 oz per day  Follow up 6 months  It was a pleasure to see you today. I want to create trusting relationships with patients and provide genuine, compassionate, and quality care. I truly value your feedback! please be on the lookout for a survey regarding your visit with me today. I appreciate your input about our visit and your time in completing this!    Jabriel Vanduyne L. [name] Hockey, MSN, APRN, AGNP-C Adult-Gerontology Nurse Practitioner [facility]

## 2023-11-06 ENCOUNTER — Telehealth: Payer: Self-pay | Admitting: Internal Medicine

## 2023-11-06 ENCOUNTER — Other Ambulatory Visit: Payer: Self-pay

## 2023-11-06 DIAGNOSIS — E119 Type 2 diabetes mellitus without complications: Secondary | ICD-10-CM

## 2023-11-06 MED ORDER — FREESTYLE LIBRE 3 PLUS SENSOR MISC: 1.0000 | 0 refills | Status: DC

## 2023-11-06 NOTE — Telephone Encounter (Signed)
 MEDICATION:  FreeStyle Libre 3 Plus Sensor Continuous Glucose Sensor (FREESTYLE LIBRE 3 PLUS SENSOR) MISC  PHARMACY:  [facility] in [place]  HAS THE PATIENT CONTACTED THEIR PHARMACY?  Yes  IS THIS A 90 DAY SUPPLY : Yes  IS PATIENT OUT OF MEDICATION: No  IF NOT; HOW MUCH IS LEFT: 3 days  LAST APPOINTMENT DATE: @5 /01/2024  NEXT APPOINTMENT DATE:@9 /03/2024  DO WE HAVE YOUR PERMISSION TO LEAVE A DETAILED MESSAGE?: Yes  OTHER COMMENTS:    **Let patient know to contact pharmacy at the end of the day to make sure medication is ready. **  ** Please notify patient to allow 48-72 hours to process**  **Encourage patient to contact the pharmacy for refills or they can request refills through [facility]**

## 2023-11-12 ENCOUNTER — Other Ambulatory Visit: Payer: Self-pay | Admitting: Internal Medicine

## 2023-11-21 ENCOUNTER — Encounter (INDEPENDENT_AMBULATORY_CARE_PROVIDER_SITE_OTHER): Payer: Self-pay

## 2023-11-22 ENCOUNTER — Encounter (INDEPENDENT_AMBULATORY_CARE_PROVIDER_SITE_OTHER): Payer: Self-pay | Admitting: *Deleted

## 2023-11-25 DIAGNOSIS — E538 Deficiency of other specified B group vitamins: Secondary | ICD-10-CM | POA: Diagnosis not present

## 2023-11-28 ENCOUNTER — Telehealth: Payer: Self-pay

## 2023-11-30 ENCOUNTER — Other Ambulatory Visit: Payer: Self-pay | Admitting: Internal Medicine

## 2023-11-30 DIAGNOSIS — E119 Type 2 diabetes mellitus without complications: Secondary | ICD-10-CM

## 2023-12-02 ENCOUNTER — Other Ambulatory Visit: Payer: Self-pay | Admitting: Internal Medicine

## 2023-12-02 ENCOUNTER — Encounter: Payer: Self-pay | Admitting: Internal Medicine

## 2023-12-02 MED ORDER — GLIPIZIDE 5 MG PO TABS: 5.0000 mg | ORAL_TABLET | Freq: Two times a day (BID) | ORAL | 1 refills | Status: DC

## 2023-12-02 NOTE — Telephone Encounter (Signed)
 I looked at her CGM tracings, and it appears that the sugars are actually increasing after breakfast and they are also high before the rest of the meals, but with the biggest increase after breakfast.  I will send a prescription for 5 mg of glipizide  to take before breakfast and she will need to call us  if the sugars remain elevated afterwards.  Also, call us  if she has lows after this.

## 2023-12-02 NOTE — Telephone Encounter (Signed)
 Pt called and left a vm stating that she can not get her blood sugar to go down.  She states its 280-290 in the morning.   Currently taking Jardiance  and Metformin  She says we d/c a medication but she does not remember what it was.

## 2023-12-03 NOTE — Telephone Encounter (Signed)
 Pt has been notified and voices understanding.

## 2023-12-07 ENCOUNTER — Other Ambulatory Visit (INDEPENDENT_AMBULATORY_CARE_PROVIDER_SITE_OTHER): Payer: Self-pay | Admitting: Gastroenterology

## 2023-12-09 DIAGNOSIS — S99922A Unspecified injury of left foot, initial encounter: Secondary | ICD-10-CM | POA: Diagnosis not present

## 2023-12-09 DIAGNOSIS — M79675 Pain in left toe(s): Secondary | ICD-10-CM | POA: Diagnosis not present

## 2023-12-09 DIAGNOSIS — I10 Essential (primary) hypertension: Secondary | ICD-10-CM | POA: Diagnosis not present

## 2023-12-09 DIAGNOSIS — Z299 Encounter for prophylactic measures, unspecified: Secondary | ICD-10-CM | POA: Diagnosis not present

## 2023-12-09 DIAGNOSIS — M19072 Primary osteoarthritis, left ankle and foot: Secondary | ICD-10-CM | POA: Diagnosis not present

## 2023-12-09 DIAGNOSIS — M79672 Pain in left foot: Secondary | ICD-10-CM | POA: Diagnosis not present

## 2023-12-10 DIAGNOSIS — R350 Frequency of micturition: Secondary | ICD-10-CM | POA: Diagnosis not present

## 2023-12-11 ENCOUNTER — Other Ambulatory Visit: Payer: Self-pay | Admitting: Internal Medicine

## 2023-12-11 DIAGNOSIS — E1165 Type 2 diabetes mellitus with hyperglycemia: Secondary | ICD-10-CM

## 2023-12-18 DIAGNOSIS — M79672 Pain in left foot: Secondary | ICD-10-CM | POA: Diagnosis not present

## 2023-12-30 DIAGNOSIS — E538 Deficiency of other specified B group vitamins: Secondary | ICD-10-CM | POA: Diagnosis not present

## 2024-01-13 DIAGNOSIS — Z299 Encounter for prophylactic measures, unspecified: Secondary | ICD-10-CM | POA: Diagnosis not present

## 2024-01-13 DIAGNOSIS — I10 Essential (primary) hypertension: Secondary | ICD-10-CM | POA: Diagnosis not present

## 2024-01-13 DIAGNOSIS — J069 Acute upper respiratory infection, unspecified: Secondary | ICD-10-CM | POA: Diagnosis not present

## 2024-01-13 DIAGNOSIS — R519 Headache, unspecified: Secondary | ICD-10-CM | POA: Diagnosis not present

## 2024-01-13 DIAGNOSIS — R52 Pain, unspecified: Secondary | ICD-10-CM | POA: Diagnosis not present

## 2024-01-13 DIAGNOSIS — I700 Atherosclerosis of aorta: Secondary | ICD-10-CM | POA: Diagnosis not present

## 2024-01-20 DIAGNOSIS — Z299 Encounter for prophylactic measures, unspecified: Secondary | ICD-10-CM | POA: Diagnosis not present

## 2024-01-20 DIAGNOSIS — I10 Essential (primary) hypertension: Secondary | ICD-10-CM | POA: Diagnosis not present

## 2024-01-20 DIAGNOSIS — R531 Weakness: Secondary | ICD-10-CM | POA: Diagnosis not present

## 2024-01-20 DIAGNOSIS — R519 Headache, unspecified: Secondary | ICD-10-CM | POA: Diagnosis not present

## 2024-01-31 DIAGNOSIS — E538 Deficiency of other specified B group vitamins: Secondary | ICD-10-CM | POA: Diagnosis not present

## 2024-02-03 ENCOUNTER — Other Ambulatory Visit: Payer: Self-pay | Admitting: Internal Medicine

## 2024-02-26 ENCOUNTER — Encounter: Payer: Self-pay | Admitting: Internal Medicine

## 2024-02-26 ENCOUNTER — Ambulatory Visit: Admitting: Internal Medicine

## 2024-02-26 VITALS — BP 120/70 | HR 61 | Ht 62.5 in | Wt 132.6 lb

## 2024-02-26 DIAGNOSIS — Z23 Encounter for immunization: Secondary | ICD-10-CM | POA: Diagnosis not present

## 2024-02-26 DIAGNOSIS — E1165 Type 2 diabetes mellitus with hyperglycemia: Secondary | ICD-10-CM | POA: Diagnosis not present

## 2024-02-26 DIAGNOSIS — E785 Hyperlipidemia, unspecified: Secondary | ICD-10-CM

## 2024-02-26 DIAGNOSIS — Z7984 Long term (current) use of oral hypoglycemic drugs: Secondary | ICD-10-CM | POA: Diagnosis not present

## 2024-02-26 LAB — POCT GLYCOSYLATED HEMOGLOBIN (HGB A1C): Hemoglobin A1C: 7.5 % — AB (ref 4.0–5.6)

## 2024-02-26 MED ORDER — REPAGLINIDE 0.5 MG PO TABS: 0.5000 mg | ORAL_TABLET | Freq: Every day | ORAL | 3 refills | Status: DC

## 2024-02-26 NOTE — Patient Instructions (Addendum)
 Please continue: - Metformin  1000 mg in am - Jardiance  25 mg before breakfast  Please stop Glipizide  completely.   Start Repaglinide  (Prandin ) 0.5 mg before breakfast only.  Try Magnilife cream for neuropathy.   Please return in 3-4 months.
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
## 2024-03-03 ENCOUNTER — Ambulatory Visit (INDEPENDENT_AMBULATORY_CARE_PROVIDER_SITE_OTHER): Admitting: Podiatry

## 2024-03-03 VITALS — Ht 62.5 in | Wt 132.6 lb

## 2024-03-03 DIAGNOSIS — E1140 Type 2 diabetes mellitus with diabetic neuropathy, unspecified: Secondary | ICD-10-CM

## 2024-03-03 DIAGNOSIS — L84 Corns and callosities: Secondary | ICD-10-CM

## 2024-03-03 DIAGNOSIS — G629 Polyneuropathy, unspecified: Secondary | ICD-10-CM

## 2024-03-03 NOTE — Patient Instructions (Signed)
 Look for urea 40% cream or ointment and apply to the thickened dry skin / calluses. This can be bought over the counter, at a pharmacy or online such as [employer].

## 2024-03-04 NOTE — Progress Notes (Signed)
  Subjective:  Patient ID: Katrina Castro, female    DOB: 1945-07-27,  MRN: [id number]  Chief Complaint  Patient presents with   [facility]    Rm 3 Patient is here for DFC/nail/callus trimming.    78 y.o. female presents with the above complaint. History confirmed with patient.  She returns for follow-up on multiple pedal issues including her toe deformities and the painful calluses.  She notes lots of burning aching pains and waking her up at night type pain shooting for the legs  Objective:  Physical Exam: warm, good capillary refill, no trophic changes or ulcerative lesions, normal DP and PT pulses, and some neuropathy noted, she has painful tender hyperkeratoses left subhallux, left submetatarsal 5, right submetatarsal 4.  Hammertoe deformities, hallux varus on the left.      Radiographs: Multiple views x-ray of the both feet: X-rays check today show multiple digital deformities, hallux varus deformity on the left with arthritic changes in the digits. Assessment:   1. Neuropathy   2. Callus of foot   3. Controlled type 2 diabetes with neuropathy [facility])      Plan:  Patient was evaluated and treated and all questions answered.  All symptomatic hyperkeratoses were safely debrided with a sterile #15 blade to patient's level of comfort without incident. We discussed preventative and palliative care of these lesions including supportive and accommodative shoegear, padding, prefabricated and custom molded accommodative orthoses, use of a pumice stone and lotions/creams daily.  We again discussed her pain in both feet she has multiple digital deformities and forefoot deformity but most of her symptoms seem to be neuropathic in nature.  I do not think that surgical intervention would help with this at this point.  It likely would be able to improve her function and structure.  I would like to have her evaluated by our interventional pain specialist to see if there are any other options for  treating her neuropathy she did not do well with gabapentin  previously.  Referral placed.  No follow-ups on file.

## 2024-03-06 DIAGNOSIS — E538 Deficiency of other specified B group vitamins: Secondary | ICD-10-CM | POA: Diagnosis not present

## 2024-03-10 ENCOUNTER — Other Ambulatory Visit: Payer: Self-pay | Admitting: Internal Medicine

## 2024-03-17 ENCOUNTER — Telehealth: Payer: Self-pay

## 2024-03-17 NOTE — Telephone Encounter (Signed)
 Patient states that her BS have been running high and would like for you to review for medication changes.   Metformin  1000 mg in am Jardiance  25 mg before breakfast Repaglinide  0.5 mg before breakfast

## 2024-03-17 NOTE — Telephone Encounter (Signed)
 Elenor, I reviewed her CGM tracings.  We can go ahead and increase her dose of repaglinide  before breakfast to 2 tablets.  We can continue the rest of the regimen otherwise. Ty! C

## 2024-03-17 NOTE — Telephone Encounter (Signed)
 Left vm and also sent my chart message.

## 2024-03-18 ENCOUNTER — Other Ambulatory Visit: Payer: Self-pay

## 2024-03-18 ENCOUNTER — Encounter: Payer: Self-pay | Admitting: Physical Medicine and Rehabilitation

## 2024-03-18 MED ORDER — REPAGLINIDE 0.5 MG PO TABS: 1.0000 mg | ORAL_TABLET | Freq: Every day | ORAL | 1 refills | Status: DC

## 2024-03-26 DIAGNOSIS — E538 Deficiency of other specified B group vitamins: Secondary | ICD-10-CM | POA: Diagnosis not present

## 2024-03-26 DIAGNOSIS — Z299 Encounter for prophylactic measures, unspecified: Secondary | ICD-10-CM | POA: Diagnosis not present

## 2024-03-26 DIAGNOSIS — I10 Essential (primary) hypertension: Secondary | ICD-10-CM | POA: Diagnosis not present

## 2024-03-28 ENCOUNTER — Other Ambulatory Visit: Payer: Self-pay | Admitting: Internal Medicine

## 2024-03-30 ENCOUNTER — Other Ambulatory Visit: Payer: Self-pay

## 2024-03-30 MED ORDER — DEXCOM G7 SENSOR MISC: 3 refills | Status: AC

## 2024-03-30 MED ORDER — DEXCOM G7 RECEIVER DEVI: 0 refills | Status: DC

## 2024-03-31 DIAGNOSIS — E538 Deficiency of other specified B group vitamins: Secondary | ICD-10-CM | POA: Diagnosis not present

## 2024-04-01 ENCOUNTER — Encounter (INDEPENDENT_AMBULATORY_CARE_PROVIDER_SITE_OTHER): Payer: Self-pay | Admitting: Gastroenterology

## 2024-04-01 DIAGNOSIS — I4891 Unspecified atrial fibrillation: Secondary | ICD-10-CM | POA: Diagnosis not present

## 2024-04-01 DIAGNOSIS — R42 Dizziness and giddiness: Secondary | ICD-10-CM | POA: Diagnosis not present

## 2024-04-01 DIAGNOSIS — E1140 Type 2 diabetes mellitus with diabetic neuropathy, unspecified: Secondary | ICD-10-CM | POA: Diagnosis not present

## 2024-04-01 DIAGNOSIS — I10 Essential (primary) hypertension: Secondary | ICD-10-CM | POA: Diagnosis not present

## 2024-04-01 DIAGNOSIS — Z299 Encounter for prophylactic measures, unspecified: Secondary | ICD-10-CM | POA: Diagnosis not present

## 2024-04-07 DIAGNOSIS — I10 Essential (primary) hypertension: Secondary | ICD-10-CM | POA: Diagnosis not present

## 2024-04-07 DIAGNOSIS — Z299 Encounter for prophylactic measures, unspecified: Secondary | ICD-10-CM | POA: Diagnosis not present

## 2024-04-07 DIAGNOSIS — I25119 Atherosclerotic heart disease of native coronary artery with unspecified angina pectoris: Secondary | ICD-10-CM | POA: Diagnosis not present

## 2024-04-07 DIAGNOSIS — J209 Acute bronchitis, unspecified: Secondary | ICD-10-CM | POA: Diagnosis not present

## 2024-04-07 DIAGNOSIS — I4891 Unspecified atrial fibrillation: Secondary | ICD-10-CM | POA: Diagnosis not present

## 2024-04-07 DIAGNOSIS — J069 Acute upper respiratory infection, unspecified: Secondary | ICD-10-CM | POA: Diagnosis not present

## 2024-04-15 ENCOUNTER — Telehealth (INDEPENDENT_AMBULATORY_CARE_PROVIDER_SITE_OTHER): Payer: Self-pay | Admitting: Gastroenterology

## 2024-04-15 ENCOUNTER — Other Ambulatory Visit (INDEPENDENT_AMBULATORY_CARE_PROVIDER_SITE_OTHER): Payer: Self-pay | Admitting: Gastroenterology

## 2024-04-15 DIAGNOSIS — R110 Nausea: Secondary | ICD-10-CM

## 2024-04-15 MED ORDER — PROMETHAZINE HCL 12.5 MG PO TABS: 12.5000 mg | ORAL_TABLET | Freq: Three times a day (TID) | ORAL | 0 refills | Status: DC | PRN

## 2024-04-15 NOTE — Telephone Encounter (Signed)
 Discussed for 12 minutes with the patient her symptoms.  She presented in the onset of nausea, vomiting and poor oral intake, with subsequent constipation.  Went to [facility] to have further evaluation and had a CT of the abdomen that showed presence of small to moderate amount of stool in her colon.  No obstruction or abnormalities.  She was given Dulcolax and advised to take MiraLAX .  States that vomiting has improved but nausea still better despite taking Zofran .  Still feels constipated.  I advised her to uptitrate her MiraLAX  to 2-3 times per day and we will send a prescription for Phenergan .  If her symptoms worsen or persist, she will need to be seen earlier in the ER.  Devere, can we get her in with any other provider in November?  It can be at [facility] as well.  Thanks

## 2024-04-15 NOTE — Telephone Encounter (Signed)
 Thanks

## 2024-04-15 NOTE — Telephone Encounter (Signed)
 Pt last seen in May 2025 and is having problems with vomiting and said she went to the ER. I do not have any openings until Dec 2025. Can we make any recommendations for her. [phone]

## 2024-04-15 NOTE — Telephone Encounter (Signed)
 I spoke with the patient she reports she had nausea and vomiting for several days in a row that prompted her to got to the ED [facility]). She says this has subsided,but she now has issues with mid abdominal pain and constipation. She says she took one packet of miralax  yesterday along with a dulcolax. She has not had a bm in 4.5 days. She denies any current issues with nausea, vomiting, fever, dark or bloody stools. Patient said multiple times I am Going to die. Please advise

## 2024-04-16 ENCOUNTER — Encounter (INDEPENDENT_AMBULATORY_CARE_PROVIDER_SITE_OTHER): Payer: Self-pay | Admitting: Gastroenterology

## 2024-04-16 ENCOUNTER — Emergency Department (HOSPITAL_COMMUNITY)

## 2024-04-16 ENCOUNTER — Inpatient Hospital Stay (HOSPITAL_COMMUNITY)
Admission: EM | Admit: 2024-04-16 | Discharge: 2024-04-18 | DRG: 639 | Disposition: A | Discharge status: Home / Self Care | Attending: Internal Medicine | Admitting: Internal Medicine

## 2024-04-16 ENCOUNTER — Ambulatory Visit (INDEPENDENT_AMBULATORY_CARE_PROVIDER_SITE_OTHER): Admitting: Gastroenterology

## 2024-04-16 ENCOUNTER — Other Ambulatory Visit: Payer: Self-pay

## 2024-04-16 ENCOUNTER — Encounter (HOSPITAL_COMMUNITY): Payer: Self-pay | Admitting: Emergency Medicine

## 2024-04-16 VITALS — BP 137/81 | HR 103 | Temp 97.5°F | Ht 62.5 in | Wt 127.0 lb

## 2024-04-16 DIAGNOSIS — Z823 Family history of stroke: Secondary | ICD-10-CM | POA: Diagnosis not present

## 2024-04-16 DIAGNOSIS — R1110 Vomiting, unspecified: Secondary | ICD-10-CM | POA: Diagnosis not present

## 2024-04-16 DIAGNOSIS — K449 Diaphragmatic hernia without obstruction or gangrene: Secondary | ICD-10-CM | POA: Diagnosis present

## 2024-04-16 DIAGNOSIS — F32A Depression, unspecified: Secondary | ICD-10-CM | POA: Diagnosis present

## 2024-04-16 DIAGNOSIS — J069 Acute upper respiratory infection, unspecified: Secondary | ICD-10-CM | POA: Diagnosis present

## 2024-04-16 DIAGNOSIS — K838 Other specified diseases of biliary tract: Secondary | ICD-10-CM | POA: Diagnosis present

## 2024-04-16 DIAGNOSIS — R112 Nausea with vomiting, unspecified: Secondary | ICD-10-CM | POA: Diagnosis not present

## 2024-04-16 DIAGNOSIS — I4891 Unspecified atrial fibrillation: Secondary | ICD-10-CM | POA: Diagnosis present

## 2024-04-16 DIAGNOSIS — E876 Hypokalemia: Secondary | ICD-10-CM | POA: Diagnosis present

## 2024-04-16 DIAGNOSIS — Z833 Family history of diabetes mellitus: Secondary | ICD-10-CM | POA: Diagnosis not present

## 2024-04-16 DIAGNOSIS — I700 Atherosclerosis of aorta: Secondary | ICD-10-CM | POA: Diagnosis present

## 2024-04-16 DIAGNOSIS — E782 Mixed hyperlipidemia: Secondary | ICD-10-CM | POA: Diagnosis present

## 2024-04-16 DIAGNOSIS — Z7984 Long term (current) use of oral hypoglycemic drugs: Secondary | ICD-10-CM

## 2024-04-16 DIAGNOSIS — Z91030 Bee allergy status: Secondary | ICD-10-CM

## 2024-04-16 DIAGNOSIS — Z79899 Other long term (current) drug therapy: Secondary | ICD-10-CM

## 2024-04-16 DIAGNOSIS — K5904 Chronic idiopathic constipation: Secondary | ICD-10-CM | POA: Diagnosis not present

## 2024-04-16 DIAGNOSIS — E1110 Type 2 diabetes mellitus with ketoacidosis without coma: Principal | ICD-10-CM | POA: Diagnosis present

## 2024-04-16 DIAGNOSIS — Z96651 Presence of right artificial knee joint: Secondary | ICD-10-CM | POA: Diagnosis present

## 2024-04-16 DIAGNOSIS — R1013 Epigastric pain: Secondary | ICD-10-CM | POA: Diagnosis present

## 2024-04-16 DIAGNOSIS — E1165 Type 2 diabetes mellitus with hyperglycemia: Secondary | ICD-10-CM | POA: Diagnosis present

## 2024-04-16 DIAGNOSIS — R1310 Dysphagia, unspecified: Secondary | ICD-10-CM | POA: Diagnosis present

## 2024-04-16 DIAGNOSIS — E860 Dehydration: Secondary | ICD-10-CM | POA: Diagnosis present

## 2024-04-16 DIAGNOSIS — I10 Essential (primary) hypertension: Secondary | ICD-10-CM | POA: Diagnosis present

## 2024-04-16 DIAGNOSIS — Z951 Presence of aortocoronary bypass graft: Secondary | ICD-10-CM

## 2024-04-16 DIAGNOSIS — K5900 Constipation, unspecified: Secondary | ICD-10-CM | POA: Diagnosis present

## 2024-04-16 DIAGNOSIS — Z882 Allergy status to sulfonamides status: Secondary | ICD-10-CM

## 2024-04-16 DIAGNOSIS — Z90710 Acquired absence of both cervix and uterus: Secondary | ICD-10-CM | POA: Diagnosis not present

## 2024-04-16 DIAGNOSIS — I2510 Atherosclerotic heart disease of native coronary artery without angina pectoris: Secondary | ICD-10-CM | POA: Diagnosis present

## 2024-04-16 DIAGNOSIS — E119 Type 2 diabetes mellitus without complications: Secondary | ICD-10-CM

## 2024-04-16 DIAGNOSIS — Z888 Allergy status to other drugs, medicaments and biological substances status: Secondary | ICD-10-CM

## 2024-04-16 DIAGNOSIS — K8689 Other specified diseases of pancreas: Secondary | ICD-10-CM | POA: Diagnosis present

## 2024-04-16 DIAGNOSIS — Z881 Allergy status to other antibiotic agents status: Secondary | ICD-10-CM

## 2024-04-16 DIAGNOSIS — Z7982 Long term (current) use of aspirin: Secondary | ICD-10-CM | POA: Diagnosis not present

## 2024-04-16 DIAGNOSIS — T383X5A Adverse effect of insulin and oral hypoglycemic [antidiabetic] drugs, initial encounter: Secondary | ICD-10-CM | POA: Diagnosis present

## 2024-04-16 DIAGNOSIS — K219 Gastro-esophageal reflux disease without esophagitis: Secondary | ICD-10-CM | POA: Diagnosis present

## 2024-04-16 LAB — GLUCOSE, CAPILLARY
Glucose-Capillary: 110 mg/dL — ABNORMAL HIGH (ref 70–99)
Glucose-Capillary: 82 mg/dL (ref 70–99)
Glucose-Capillary: 78 mg/dL (ref 70–99)

## 2024-04-16 LAB — BASIC METABOLIC PANEL WITH GFR
Anion gap: 13 (ref 5–15)
Anion gap: 14 (ref 5–15)
BUN: 9 mg/dL (ref 8–23)
BUN: 7 mg/dL — ABNORMAL LOW (ref 8–23)
CO2: 22 mmol/L (ref 22–32)
CO2: 21 mmol/L — ABNORMAL LOW (ref 22–32)
Calcium: 8.7 mg/dL — ABNORMAL LOW (ref 8.9–10.3)
Calcium: 8.9 mg/dL (ref 8.9–10.3)
Chloride: 103 mmol/L (ref 98–111)
Chloride: 103 mmol/L (ref 98–111)
Creatinine, Ser: 0.5 mg/dL (ref 0.44–1.00)
Creatinine, Ser: 0.48 mg/dL (ref 0.44–1.00)
GFR, Estimated: >60 mL/min (ref >60)
GFR, Estimated: >60 mL/min (ref >60)
Glucose, Bld: 117 mg/dL — ABNORMAL HIGH (ref 70–99)
Glucose, Bld: 79 mg/dL (ref 70–99)
Potassium: 3.6 mmol/L (ref 3.5–5.1)
Potassium: 3.5 mmol/L (ref 3.5–5.1)
Sodium: 139 mmol/L (ref 135–145)
Sodium: 138 mmol/L (ref 135–145)

## 2024-04-16 LAB — COMPREHENSIVE METABOLIC PANEL WITH GFR
ALT: 30 U/L (ref 0–44)
AST: 42 U/L — ABNORMAL HIGH (ref 15–41)
Albumin: 4.7 g/dL (ref 3.5–5.0)
Alkaline Phosphatase: 90 U/L (ref 38–126)
Anion gap: 20 — ABNORMAL HIGH (ref 5–15)
BUN: 14 mg/dL (ref 8–23)
CO2: 19 mmol/L — ABNORMAL LOW (ref 22–32)
Calcium: 10.3 mg/dL (ref 8.9–10.3)
Chloride: 102 mmol/L (ref 98–111)
Creatinine, Ser: 0.55 mg/dL (ref 0.44–1.00)
GFR, Estimated: >60 mL/min (ref >60)
Glucose, Bld: 101 mg/dL — ABNORMAL HIGH (ref 70–99)
Potassium: 3.6 mmol/L (ref 3.5–5.1)
Sodium: 142 mmol/L (ref 135–145)
Total Bilirubin: 0.4 mg/dL (ref 0.0–1.2)
Total Protein: 7.7 g/dL (ref 6.5–8.1)

## 2024-04-16 LAB — CBG MONITORING, ED
Glucose-Capillary: 88 mg/dL (ref 70–99)
Glucose-Capillary: 127 mg/dL — ABNORMAL HIGH (ref 70–99)

## 2024-04-16 LAB — URINALYSIS, ROUTINE W REFLEX MICROSCOPIC
Bacteria, UA: NONE SEEN
Bilirubin Urine: NEGATIVE
Glucose, UA: >=500 mg/dL — AB
Ketones, ur: 80 mg/dL — AB
Leukocytes,Ua: NEGATIVE
Nitrite: NEGATIVE
Protein, ur: NEGATIVE mg/dL
Specific Gravity, Urine: 1.036 — ABNORMAL HIGH (ref 1.005–1.030)
pH: 5 (ref 5.0–8.0)

## 2024-04-16 LAB — BLOOD GAS, VENOUS
Acid-base deficit: 11.8 mmol/L — ABNORMAL HIGH (ref 0.0–2.0)
Bicarbonate: 14.6 mmol/L — ABNORMAL LOW (ref 20.0–28.0)
Drawn by: 442
O2 Saturation: 47.6 %
Patient temperature: 36.6
pCO2, Ven: 33 mmHg — ABNORMAL LOW (ref 44–60)
pH, Ven: 7.25 (ref 7.25–7.43)
pO2, Ven: <31 mmHg — CL (ref 32–45)

## 2024-04-16 LAB — CBC WITH DIFFERENTIAL/PLATELET
Abs Immature Granulocytes: 0.01 K/uL (ref 0.00–0.07)
Basophils Absolute: 0 K/uL (ref 0.0–0.1)
Basophils Relative: 0 %
Eosinophils Absolute: 0 K/uL (ref 0.0–0.5)
Eosinophils Relative: 0 %
HCT: 47 % — ABNORMAL HIGH (ref 36.0–46.0)
Hemoglobin: 16.1 g/dL — ABNORMAL HIGH (ref 12.0–15.0)
Immature Granulocytes: 0 %
Lymphocytes Relative: 19 %
Lymphs Abs: 2 K/uL (ref 0.7–4.0)
MCH: 29.4 pg (ref 26.0–34.0)
MCHC: 34.3 g/dL (ref 30.0–36.0)
MCV: 85.9 fL (ref 80.0–100.0)
Monocytes Absolute: 0.6 K/uL (ref 0.1–1.0)
Monocytes Relative: 6 %
Neutro Abs: 7.9 K/uL — ABNORMAL HIGH (ref 1.7–7.7)
Neutrophils Relative %: 75 %
Platelets: 232 K/uL (ref 150–400)
RBC: 5.47 MIL/uL — ABNORMAL HIGH (ref 3.87–5.11)
RDW: 12.7 % (ref 11.5–15.5)
WBC: 10.6 K/uL — ABNORMAL HIGH (ref 4.0–10.5)
nRBC: 0 % (ref 0.0–0.2)

## 2024-04-16 LAB — TROPONIN T, HIGH SENSITIVITY
Troponin T High Sensitivity: <15 ng/L (ref 0–19)
Troponin T High Sensitivity: <15 ng/L (ref 0–19)

## 2024-04-16 LAB — MRSA NEXT GEN BY PCR, NASAL: MRSA by PCR Next Gen: NOT DETECTED

## 2024-04-16 LAB — LIPASE, BLOOD: Lipase: 14 U/L (ref 11–51)

## 2024-04-16 LAB — BETA-HYDROXYBUTYRIC ACID: Beta-Hydroxybutyric Acid: 4 mmol/L — ABNORMAL HIGH (ref 0.05–0.27)

## 2024-04-16 MED ORDER — DEXTROSE IN LACTATED RINGERS 5 % IV SOLN: INTRAVENOUS | Status: DC

## 2024-04-16 MED ORDER — MORPHINE SULFATE (PF) 4 MG/ML IV SOLN
4.0000 mg | Freq: Once | INTRAVENOUS | Status: AC
  Administered 2024-04-16: 4 mg via INTRAVENOUS
  Filled 2024-04-16: qty 1

## 2024-04-16 MED ORDER — ASPIRIN 81 MG PO TBEC
81.0000 mg | DELAYED_RELEASE_TABLET | Freq: Every day | ORAL | Status: DC
  Administered 2024-04-17 – 2024-04-18 (×2): 81 mg via ORAL
  Filled 2024-04-16 (×2): qty 1

## 2024-04-16 MED ORDER — IOHEXOL 300 MG/ML  SOLN
100.0000 mL | Freq: Once | INTRAMUSCULAR | Status: AC | PRN
  Administered 2024-04-16: 100 mL via INTRAVENOUS

## 2024-04-16 MED ORDER — ENOXAPARIN SODIUM 40 MG/0.4ML IJ SOSY
40.0000 mg | PREFILLED_SYRINGE | INTRAMUSCULAR | Status: DC
  Administered 2024-04-16 – 2024-04-17 (×2): 40 mg via SUBCUTANEOUS
  Filled 2024-04-16 (×2): qty 0.4

## 2024-04-16 MED ORDER — SODIUM CHLORIDE 0.9 % IV SOLN: 8.0000 mg | Freq: Three times a day (TID) | INTRAVENOUS | Status: DC | PRN

## 2024-04-16 MED ORDER — DEXTROSE 50 % IV SOLN: 0.0000 mL | INTRAVENOUS | Status: DC | PRN

## 2024-04-16 MED ORDER — FAMOTIDINE IN NACL 20-0.9 MG/50ML-% IV SOLN
20.0000 mg | Freq: Once | INTRAVENOUS | Status: AC
  Administered 2024-04-16: 20 mg via INTRAVENOUS
  Filled 2024-04-16: qty 50

## 2024-04-16 MED ORDER — ONDANSETRON HCL 4 MG/2ML IJ SOLN
4.0000 mg | Freq: Three times a day (TID) | INTRAMUSCULAR | Status: DC | PRN
  Administered 2024-04-16: 4 mg via INTRAVENOUS
  Filled 2024-04-16: qty 2

## 2024-04-16 MED ORDER — LACTATED RINGERS IV BOLUS
1000.0000 mL | Freq: Once | INTRAVENOUS | Status: AC
  Administered 2024-04-16: 1000 mL via INTRAVENOUS

## 2024-04-16 MED ORDER — POTASSIUM CHLORIDE 10 MEQ/100ML IV SOLN
10.0000 meq | INTRAVENOUS | Status: AC
  Administered 2024-04-16 (×2): 10 meq via INTRAVENOUS
  Filled 2024-04-16: qty 100

## 2024-04-16 MED ORDER — ISOSORBIDE MONONITRATE ER 30 MG PO TB24
30.0000 mg | ORAL_TABLET | Freq: Every day | ORAL | Status: DC
  Administered 2024-04-17 – 2024-04-18 (×2): 30 mg via ORAL
  Filled 2024-04-16 (×2): qty 1

## 2024-04-16 MED ORDER — METOPROLOL TARTRATE 25 MG PO TABS
12.5000 mg | ORAL_TABLET | Freq: Two times a day (BID) | ORAL | Status: DC
  Administered 2024-04-16 – 2024-04-18 (×3): 12.5 mg via ORAL
  Filled 2024-04-16 (×4): qty 1

## 2024-04-16 MED ORDER — LACTATED RINGERS IV SOLN: INTRAVENOUS | Status: DC

## 2024-04-16 MED ORDER — ACYCLOVIR 800 MG PO TABS
400.0000 mg | ORAL_TABLET | Freq: Every day | ORAL | Status: DC
  Administered 2024-04-17 – 2024-04-18 (×2): 400 mg via ORAL
  Filled 2024-04-16 (×2): qty 1

## 2024-04-16 MED ORDER — SODIUM CHLORIDE 0.9 % IV BOLUS
1000.0000 mL | Freq: Once | INTRAVENOUS | Status: AC
  Administered 2024-04-16: 1000 mL via INTRAVENOUS

## 2024-04-16 MED ORDER — ROSUVASTATIN CALCIUM 20 MG PO TABS
40.0000 mg | ORAL_TABLET | Freq: Every day | ORAL | Status: DC
  Administered 2024-04-17 – 2024-04-18 (×2): 40 mg via ORAL
  Filled 2024-04-16 (×2): qty 2

## 2024-04-16 MED ORDER — INSULIN ASPART 100 UNIT/ML IJ SOLN: 0.0000 [IU] | Freq: Three times a day (TID) | INTRAMUSCULAR | Status: DC

## 2024-04-16 MED ORDER — LACTATED RINGERS IV SOLN
INTRAVENOUS | Status: AC
Start: 2024-04-16 — End: 2024-04-17

## 2024-04-16 MED ORDER — INSULIN REGULAR(HUMAN) IN NACL 100-0.9 UT/100ML-% IV SOLN
INTRAVENOUS | Status: DC
  Administered 2024-04-16: 2 [IU]/h via INTRAVENOUS
  Filled 2024-04-16: qty 100

## 2024-04-16 MED ORDER — CHLORHEXIDINE GLUCONATE CLOTH 2 % EX PADS
6.0000 | MEDICATED_PAD | Freq: Every day | CUTANEOUS | Status: DC
  Administered 2024-04-17 – 2024-04-18 (×2): 6 via TOPICAL

## 2024-04-16 MED ORDER — INSULIN ASPART 100 UNIT/ML IJ SOLN: 0.0000 [IU] | Freq: Every day | INTRAMUSCULAR | Status: DC

## 2024-04-16 MED ORDER — DEXTROSE 50 % IV SOLN
0.0000 mL | INTRAVENOUS | Status: DC | PRN
  Filled 2024-04-16: qty 50

## 2024-04-16 MED ORDER — ONDANSETRON HCL 4 MG/2ML IJ SOLN
4.0000 mg | Freq: Once | INTRAMUSCULAR | Status: AC
  Administered 2024-04-16: 4 mg via INTRAVENOUS
  Filled 2024-04-16: qty 2

## 2024-04-16 NOTE — ED Triage Notes (Signed)
 Pt sent over by [facility] office for pt having N/V x 1 week. Pt denies diarrhea. C/o of overall fatigue and weakness. States she has a hiatal hernia that is bothering her.
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
## 2024-04-16 NOTE — ED Notes (Signed)
 EDP at [facility]

## 2024-04-16 NOTE — ED Notes (Signed)
 Xray at [facility]

## 2024-04-16 NOTE — ED Provider Notes (Signed)
 [place] [facility] AT [facility] Provider Note  CSN: [id number] Arrival date & time: 04/16/24 1019  Chief Complaint(s) Nausea  HPI Katrina Castro is a 78 y.o. female with past medical history as below, significant for CAD, DM, GERD, hypertension, A-fib, dysphagia who presents to the ED with complaint of nausea, vomiting abdominal pain fatigue  Patient symptoms ongoing around a week, she was initially diagnosed with a URI and started on steroids and antibiotics.  After taking the antibiotics for couple days she felt like she was choking on the antibiotics and then began having nausea and vomiting epigastric pain.  She stopped taking the antibiotics and switched to azithromycin.  Symptoms have been ongoing since then.  She was seen at [facility] on 10/27, workup at that time was reassuring, she was started on PPI, diagnosed with UTI, CT abdomen and head was also reassuring.  Patient was seen in GI office this morning.  She was started on Phenergan , MiraLAX  was increased.  She was having dry heaves and felt dizzy, she was sent to the ER for further evaluation, concern dehydration  Past Medical History Past Medical History:  Diagnosis Date   Arthritis    CAD (coronary artery disease)    a. s/p CABG on 04/27/2020 with LIMA-LAD, seq SVG-PDA-PL, seq left radial-RI-OM   Concussion    2015   Depression    Diabetes (HCC)    type 2   Dysphagia    Dysrhythmia    afib   GERD (gastroesophageal reflux disease)    Headache    History of bronchitis    Hypertension    Seasonal allergies    Toenail fungus    Patient Active Problem List   Diagnosis Date Noted   Intractable vomiting 04/16/2024   Anemia 10/01/2023   Nausea without vomiting 10/01/2023   Generalized abdominal pain 10/01/2023   Melena 10/01/2023   Acute diarrhea 03/29/2023   Bloating 03/29/2023   Lumbar stenosis with neurogenic claudication 11/20/2022   Constipation 12/29/2020   Fever 05/12/2020    S/P CABG x 5 04/27/2020   Unstable angina (HCC) 04/25/2020   Hx of total knee arthroplasty, right 04/03/2017   New onset a-fib (HCC) 05/18/2016   Hypokalemia 05/18/2016   Diabetes (HCC) 05/18/2016   Atrial fibrillation (HCC) 05/18/2016   Migraine without aura and without status migrainosus, not intractable 05/10/2015   GERD (gastroesophageal reflux disease) 01/07/2012   Dysphagia 11/26/2011   Home Medication(s) Prior to Admission medications   Medication Sig Start Date End Date Taking? Authorizing Provider  acyclovir  (ZOVIRAX ) 400 MG tablet Take 400 mg by mouth daily.    [provider]  aspirin  81 MG EC tablet Take 1 tablet (81 mg total) by mouth daily. Swallow whole. 07/21/21   Okey Vina GAILS, MD  Continuous Glucose Receiver (DEXCOM G7 RECEIVER) DEVI Use to monitor glucose continuously 03/30/24   Trixie File, MD  Continuous Glucose Sensor (DEXCOM G7 SENSOR) MISC Use to check glucose continuously, change sensor every 10 days 03/30/24   Trixie File, MD  dicyclomine  (BENTYL ) 10 MG capsule Take 1 capsule (10 mg total) by mouth every 12 (twelve) hours as needed. 10/16/23   Eartha Angelia Sieving, MD  empagliflozin  (JARDIANCE ) 25 MG TABS tablet take 1 tablet by mouth daily before breakfast. 12/12/23   Trixie File, MD  ezetimibe  (ZETIA ) 10 MG tablet take 1 tablet once daily. 09/16/23   Miriam Norris, NP  isosorbide  mononitrate (IMDUR ) 30 MG 24 hr tablet take 1 tablet (30 milligram  total) by mouth daily. 02/06/24   Okey Vina GAILS, MD  levocetirizine (XYZAL ) 5 MG tablet Take 5 mg by mouth every evening.    [provider]  linaclotide  (LINZESS ) 72 MCG capsule Take 1 capsule (72 mcg total) by mouth daily before breakfast. Patient not taking: Reported on 04/16/2024 10/08/23   Castaneda Mayorga, Daniel, MD  metFORMIN  (GLUCOPHAGE ) 1000 MG tablet Take 1 tablet (1,000 mg total) by mouth daily with breakfast. 05/09/23   Trixie File, MD  metoprolol  tartrate  (LOPRESSOR ) 25 MG tablet take (1/2) tablet by mouth 2 times a day. 09/16/23   Miriam Norris, NP  nitroGLYCERIN  (NITROSTAT ) 0.4 MG SL tablet Place 1 tablet (0.4 mg total) under the tongue every 5 (five) minutes as needed for chest pain. 11/06/21   Okey Vina GAILS, MD  omeprazole  (PRILOSEC) 40 MG capsule take 1 capsule (40 MILLIGRAM total) by mouth daily. 12/09/23   Carlan, Chelsea L, NP  oxybutynin  (DITROPAN -XL) 10 MG 24 hr tablet Take 10 mg by mouth every evening.    [provider]  promethazine  (PHENERGAN ) 12.5 MG tablet Take 1 tablet (12.5 mg total) by mouth every 8 (eight) hours as needed for nausea or vomiting. 04/15/24   Eartha Angelia Sieving, MD  repaglinide  (PRANDIN ) 0.5 MG tablet Take 2 tablets (1 mg total) by mouth daily before breakfast. Take 2 tablet (0.5 mg total) by mouth daily before breakfast. 03/18/24   Trixie File, MD  rosuvastatin  (CRESTOR ) 40 MG tablet take 1 tablet by mouth once daily. 09/16/23   Miriam Norris, NP                                                                                                                                    Past Surgical History Past Surgical History:  Procedure Laterality Date   APPENDECTOMY     BACK SURGERY     x2   BOTOX  INJECTION N/A 06/06/2016   Procedure: BOTOX  INJECTION;  Surgeon: Claudis RAYMOND Rivet, MD;  [facility]: [facility];  Service: Endoscopy;  Laterality: N/A;   BOTOX  INJECTION N/A 01/17/2021   Procedure: BOTOX  INJECTION;  Surgeon: Eartha Angelia Sieving, MD;  Location: [facility];  Service: Gastroenterology;  Laterality: N/A;   BOTOX  INJECTION N/A 07/11/2021   Procedure: BOTOX  INJECTION;  Surgeon: Eartha Angelia Sieving, MD;  [facility]: [facility];  Service: Gastroenterology;  Laterality: N/A;   BOTOX  INJECTION N/A 08/21/2022   Procedure: BOTOX  INJECTION;  Surgeon: Eartha Angelia Sieving, MD;  [facility]: [facility];  Service: Gastroenterology;  Laterality: N/A;   CHOLECYSTECTOMY     COLONOSCOPY N/A  11/05/2012   Procedure: COLONOSCOPY;  Surgeon: Claudis RAYMOND Rivet, MD;  [facility]: [facility];  Service: Endoscopy;  Laterality: N/A;  1030   COLONOSCOPY N/A 10/08/2023   Procedure: COLONOSCOPY;  Surgeon: Eartha Angelia Sieving, MD;  [facility]: [facility];  Service: Gastroenterology;  Laterality: N/A;   COLONOSCOPY WITH PROPOFOL  N/A 05/01/2019  Procedure: COLONOSCOPY WITH PROPOFOL ;  Surgeon: Golda Claudis PENNER, MD;  [facility]: [facility];  Service: Endoscopy;  Laterality: N/A;  11:20am   CORONARY ARTERY BYPASS GRAFT N/A 04/27/2020   Procedure: CORONARY ARTERY BYPASS GRAFTING (CABG) TIMES FIVE USING LEFT INTERNAL MAMMARY ARTERY, LEFT HARVESTED RADIAL ARTERY, RIGHT GREATER SAPHENOUS VEIN HARVESTED ENDOSCOPICALLY.;  Surgeon: German Bartlett PEDLAR, MD;  Location: [facility];  Service: Open Heart Surgery;  Laterality: N/A;   ESOPHAGEAL DILATION N/A 07/06/2015   Procedure: ESOPHAGEAL DILATION;  Surgeon: Claudis PENNER Golda, MD;  [facility]: [facility];  Service: Endoscopy;  Laterality: N/A;   ESOPHAGEAL DILATION N/A 06/06/2016   Procedure: ESOPHAGEAL DILATION;  Surgeon: Claudis PENNER Golda, MD;  [facility]: [facility];  Service: Endoscopy;  Laterality: N/A;   ESOPHAGEAL DILATION N/A 08/21/2022   Procedure: ESOPHAGEAL DILATION;  Surgeon: Eartha Angelia Sieving, MD;  [facility];  Service: Gastroenterology;  Laterality: N/A;   ESOPHAGOGASTRODUODENOSCOPY N/A 02/12/2014   Procedure: ESOPHAGOGASTRODUODENOSCOPY (EGD);  Surgeon: Claudis PENNER Golda, MD;  [facility]: [facility];  Service: Endoscopy;  Laterality: N/A;  150   ESOPHAGOGASTRODUODENOSCOPY N/A 07/06/2015   Procedure: ESOPHAGOGASTRODUODENOSCOPY (EGD);  Surgeon: Claudis PENNER Golda, MD;  [facility]: [facility];  Service: Endoscopy;  Laterality: N/A;  1:25 - moved to 1/18 @ 10:30 - Ann to notify pt   ESOPHAGOGASTRODUODENOSCOPY N/A 10/08/2023   Procedure: EGD (ESOPHAGOGASTRODUODENOSCOPY);  Surgeon: Eartha Angelia, Sieving, MD;  [facility]: [facility];   Service: Gastroenterology;  Laterality: N/A;  10:30AM;ASA 3   ESOPHAGOGASTRODUODENOSCOPY (EGD) WITH ESOPHAGEAL DILATION N/A 07/25/2012   Procedure: ESOPHAGOGASTRODUODENOSCOPY (EGD) WITH ESOPHAGEAL DILATION;  Surgeon: Claudis PENNER Golda, MD;  [facility];  Service: Endoscopy;  Laterality: N/A;  325-rescheduled to 855 Ann notified pt   ESOPHAGOGASTRODUODENOSCOPY (EGD) WITH PROPOFOL  N/A 06/06/2016   Procedure: ESOPHAGOGASTRODUODENOSCOPY (EGD) WITH PROPOFOL ;  Surgeon: Claudis PENNER Golda, MD;  [facility];  Service: Endoscopy;  Laterality: N/A;   ESOPHAGOGASTRODUODENOSCOPY (EGD) WITH PROPOFOL  N/A 01/17/2021   Procedure: ESOPHAGOGASTRODUODENOSCOPY (EGD) WITH PROPOFOL ;  Surgeon: Eartha Angelia Sieving, MD;  [facility];  Service: Gastroenterology;  Laterality: N/A;  10:35   ESOPHAGOGASTRODUODENOSCOPY (EGD) WITH PROPOFOL  N/A 07/11/2021   Procedure: ESOPHAGOGASTRODUODENOSCOPY (EGD) WITH PROPOFOL ;  Surgeon: Eartha Angelia Sieving, MD;  [facility];  Service: Gastroenterology;  Laterality: N/A;  11:15   ESOPHAGOGASTRODUODENOSCOPY (EGD) WITH PROPOFOL  N/A 08/21/2022   Procedure: ESOPHAGOGASTRODUODENOSCOPY (EGD) WITH PROPOFOL ;  Surgeon: Eartha Angelia Sieving, MD;  [facility];  Service: Gastroenterology;  Laterality: N/A;  1115AM, ASA 2   EYE SURGERY     cataracts removed   Foot surgeries Bilateral    hammer toes   LEFT HEART CATH AND CORONARY ANGIOGRAPHY N/A 04/26/2020   Procedure: LEFT HEART CATH AND CORONARY ANGIOGRAPHY;  Surgeon: Jordan, Peter M, MD;  Location: [facility] INVASIVE CV LAB;  Service: Cardiovascular;  Laterality: N/A;   LUMBAR LAMINECTOMY/DECOMPRESSION MICRODISCECTOMY Bilateral 11/20/2022   Procedure: Lumbar Five-Sacral One Laminectomy and Foraminotomy;  Surgeon: Louis Shove, MD;  Location: [facility] OR;  Service: Neurosurgery;  Laterality: Bilateral;  3C   MALONEY DILATION N/A 02/12/2014   Procedure: MALONEY DILATION;  Surgeon: Claudis PENNER Golda, MD;   [facility];  Service: Endoscopy;  Laterality: N/A;   POLYPECTOMY  05/01/2019   Procedure: POLYPECTOMY;  Surgeon: Golda Claudis PENNER, MD;  [facility]: [facility];  Service: Endoscopy;;  colon    RADIAL ARTERY HARVEST Left 04/27/2020   Procedure: LEFT RADIAL ARTERY HARVEST;  Surgeon: German Bartlett PEDLAR, MD;  Location: [facility];  Service: Open Heart Surgery;  Laterality: Left;   Right knee arthroscopy     x2   TEE WITHOUT CARDIOVERSION N/A 04/27/2020   Procedure: TRANSESOPHAGEAL ECHOCARDIOGRAM (TEE);  Surgeon: German Bartlett PEDLAR, MD;  Location: [facility] OR;  Service: Open Heart Surgery;  Laterality: N/A;   TONSILLECTOMY     TOTAL ABDOMINAL HYSTERECTOMY     TOTAL KNEE ARTHROPLASTY Right 04/03/2017   Procedure: RIGHT TOTAL KNEE ARTHROPLASTY;  Surgeon: Heide Ingle, MD;  Location: [facility];  Service: Orthopedics;  Laterality: Right;   Family History Family History  Problem Relation Age of Onset   Diabetes Mother    Stroke Father    Diabetes Sister    Colon cancer Neg Hx    Breast cancer Neg Hx     Social History Social History   Tobacco Use   Smoking status: Never   Smokeless tobacco: Never  Vaping Use   Vaping status: Never Used  Substance Use Topics   Alcohol  use: No    Alcohol /week: 0.0 standard drinks of alcohol     Comment: socially    Drug use: No   Allergies Bee venom, Levofloxacin, Cardizem  [diltiazem ], Sulfa antibiotics, Diazepam, and Lipitor [atorvastatin]  Review of Systems A thorough review of systems was obtained and all systems are negative except as noted in the HPI and PMH.   Physical Exam Vital Signs  I have reviewed the triage vital signs BP 130/80   Pulse 98   Temp 97.9 F (36.6 C) (Oral)   Resp 13   Ht 5' 2 (1.575 m)   Wt 57 kg   SpO2 93%   BMI 22.98 kg/m  Physical Exam Vitals and nursing note reviewed.  Constitutional:      General: She is not in acute distress.    Appearance: Normal appearance. She is well-developed. She is not  ill-appearing.  HENT:     Head: Normocephalic and atraumatic.     Right Ear: External ear normal.     Left Ear: External ear normal.     Nose: Nose normal.     Mouth/Throat:     Mouth: Mucous membranes are moist.  Eyes:     General: No scleral icterus.       Right eye: No discharge.        Left eye: No discharge.  Cardiovascular:     Rate and Rhythm: Normal rate.  Pulmonary:     Effort: Pulmonary effort is normal. No respiratory distress.     Breath sounds: No stridor.  Abdominal:     General: Abdomen is flat. There is no distension.     Palpations: Abdomen is soft.     Tenderness: There is abdominal tenderness. There is no guarding.   Musculoskeletal:        General: No deformity.     Cervical back: No rigidity.  Skin:    General: Skin is warm and dry.     Coloration: Skin is not cyanotic, jaundiced or pale.  Neurological:     Mental Status: She is alert.  Psychiatric:        Speech: Speech normal.        Behavior: Behavior normal. Behavior is cooperative.     ED Results and Treatments Labs (all labs ordered are listed, but only abnormal results are displayed) Labs Reviewed  CBC WITH DIFFERENTIAL/PLATELET - Abnormal; Notable for the following components:      Result Value   WBC 10.6 (*)    RBC 5.47 (*)  Hemoglobin 16.1 (*)    HCT 47.0 (*)    Neutro Abs 7.9 (*)    All other components within normal limits  COMPREHENSIVE METABOLIC PANEL WITH GFR - Abnormal; Notable for the following components:   CO2 19 (*)    Glucose, Bld 101 (*)    AST 42 (*)    Anion gap 20 (*)    All other components within normal limits  URINALYSIS, ROUTINE W REFLEX MICROSCOPIC - Abnormal; Notable for the following components:   Specific Gravity, Urine 1.036 (*)    Glucose, UA >=500 (*)    Hgb urine dipstick SMALL (*)    Ketones, ur 80 (*)    All other components within normal limits  BLOOD GAS, VENOUS - Abnormal; Notable for the following components:   pCO2, Ven 33 (*)    pO2, Ven  <31 (*)    Bicarbonate 14.6 (*)    Acid-base deficit 11.8 (*)    All other components within normal limits  LIPASE, BLOOD  BETA-HYDROXYBUTYRIC ACID  BASIC METABOLIC PANEL WITH GFR  BASIC METABOLIC PANEL WITH GFR  BASIC METABOLIC PANEL WITH GFR  BASIC METABOLIC PANEL WITH GFR  CBG MONITORING, ED  TROPONIN T, HIGH SENSITIVITY  TROPONIN T, HIGH SENSITIVITY                                                                                                                          Radiology CT ABDOMEN PELVIS W CONTRAST Result Date: 04/16/2024 CLINICAL DATA:  Epigastric abdominal pain with nausea and vomiting for the past week. EXAM: CT ABDOMEN AND PELVIS WITH CONTRAST TECHNIQUE: Multidetector CT imaging of the abdomen and pelvis was performed using the standard protocol following bolus administration of intravenous contrast. RADIATION DOSE REDUCTION: This exam was performed according to the departmental dose-optimization program which includes automated exposure control, adjustment of the mA and/or kV according to patient size and/or use of iterative reconstruction technique. CONTRAST:  OMNIPAQUE  IOHEXOL  300 MG/ML  SOLN COMPARISON:  09/24/2023 and 05/12/2020 FINDINGS: Lower chest: Moderate-sized hiatal hernia with a paraesophageal component and an interval significant increase in size. Normal sized heart. 3 mm left lower lobe nodule on image number 2/4, unchanged since 05/12/2020, not requiring imaging follow-up. Similar right lower lobe nodule on image number 1/4, also unchanged and not needing imaging follow-up. Minimal bilateral dependent atelectasis. Hepatobiliary: Cholecystectomy clips are again demonstrated with interval mild-to-moderate intrahepatic and extrahepatic biliary ductal dilatation with no obstructing stone or mass seen. The common duct currently measures 12 mm in maximum diameter on image number 22/4, previously 7 mm on 09/24/2023. Pancreas: Stable moderate atrophy of the pancreas  with minimally progressive pancreatic ductal dilatation. Spleen: Normal in size without focal abnormality. Adrenals/Urinary Tract: Adrenal glands are unremarkable. Kidneys are normal, without renal calculi, focal lesion, or hydronephrosis. Bladder is unremarkable. Stomach/Bowel: Moderate-sized hiatal hernia with a paraesophageal component and an interval significant increase in size. Surgically absent appendix. Unremarkable colon and small bowel. Vascular/Lymphatic: Atheromatous arterial calcifications without  aneurysm. No enlarged lymph nodes. Reproductive: Status post hysterectomy. No adnexal masses. Other: No abdominal wall hernia or abnormality. No abdominopelvic ascites. Musculoskeletal: Lumbar and lower thoracic spine degenerative changes. IMPRESSION: 1. Interval mild-to-moderate intrahepatic and extrahepatic biliary ductal dilatation with no obstructing stone or mass seen. This could be due to a stricture, occult mass or noncalcified stone. This could be further evaluated with MRCP. 2. Moderate-sized hiatal hernia with a paraesophageal component and an interval significant increase in size. 3. Stable moderate atrophy of the pancreas with minimally progressive pancreatic ductal dilatation. Electronically Signed   By: Elspeth Bathe M.D.   On: 04/16/2024 14:20   DG Chest Portable 1 View Result Date: 04/16/2024 CLINICAL DATA:  Chest pain and nausea for the past 2 weeks. Diarrhea. EXAM: PORTABLE CHEST 1 VIEW COMPARISON:  03/18/2023 FINDINGS: Normal sized heart. Stable post CABG changes. Clear lungs with normal vascularity. Small to moderate-sized hiatal hernia. Diffuse osteopenia. IMPRESSION: 1. No acute abnormality. 2. Small to moderate-sized hiatal hernia. Electronically Signed   By: Elspeth Bathe M.D.   On: 04/16/2024 12:35    Pertinent labs & imaging results that were available during my care of the patient were reviewed by me and considered in my medical decision making (see MDM for  details).  Medications Ordered in ED Medications  insulin  regular, human (MYXREDLIN ) 100 units/ 100 mL infusion (0 Units/hr Intravenous Hold 04/16/24 1530)  lactated ringers  infusion ( Intravenous Not Given 04/16/24 1519)  dextrose  5 % in lactated ringers  infusion ( Intravenous New Bag/Given 04/16/24 1517)  dextrose  50 % solution 0-50 mL (has no administration in time range)  potassium chloride  10 mEq in 100 mL IVPB (10 mEq Intravenous New Bag/Given 04/16/24 1519)  sodium chloride  0.9 % bolus 1,000 mL (1,000 mLs Intravenous Bolus 04/16/24 1228)  ondansetron  (ZOFRAN ) injection 4 mg (4 mg Intravenous Given 04/16/24 1228)  famotidine  (PEPCID ) IVPB 20 mg premix (0 mg Intravenous Stopped 04/16/24 1314)  morphine  (PF) 4 MG/ML injection 4 mg (4 mg Intravenous Given 04/16/24 1228)  iohexol  (OMNIPAQUE ) 300 MG/ML solution 100 mL (100 mLs Intravenous Contrast Given 04/16/24 1328)  lactated ringers  bolus 1,000 mL (0 mLs Intravenous Stopped 04/16/24 1510)                                                                                                                                     Procedures .Critical Care  Performed by: Elnor Jayson LABOR, DO Authorized by: Elnor Jayson LABOR, DO   Critical care provider statement:    Critical care time (minutes):  45   Critical care time was exclusive of:  Separately billable procedures and treating other patients   Critical care was necessary to treat or prevent imminent or life-threatening deterioration of the following conditions:  Endocrine crisis   Critical care was time spent personally by me on the following activities:  Development of treatment plan with patient or surrogate, discussions with consultants, evaluation of  patient's response to treatment, examination of patient, ordering and review of laboratory studies, ordering and review of radiographic studies, ordering and performing treatments and interventions, pulse oximetry, re-evaluation of patient's  condition, review of old charts and obtaining history from patient or surrogate   Care discussed with: admitting provider     (including critical care time)  Medical Decision Making / ED Course    Medical Decision Making:    Marianita Botkin is a 78 y.o. female with past medical history as below, significant for CAD, DM, GERD, hypertension, A-fib, dysphagia who presents to the ED with complaint of nausea, vomiting abdominal pain fatigue . The complaint involves an extensive differential diagnosis and also carries with it a high risk of complications and morbidity.  Serious etiology was considered. Ddx includes but is not limited to: Differential diagnosis includes but is not exclusive to acute cholecystitis, intrathoracic causes for epigastric abdominal pain, gastritis, duodenitis, pancreatitis, small bowel or large bowel obstruction, abdominal aortic aneurysm, hernia, gastritis, etc.   Complete initial physical exam performed, notably the patient was in no acute distress abdomen soft.    Reviewed and confirmed nursing documentation for past medical history, family history, social history.  Vital signs reviewed.    Epigastric abdominal pain Nausea and vomiting> - Patient reports symptoms started after taking antibiotics that she is having trouble swallowing.  She is unsure of the antibiotic but reports that it was a blue-colored capsule.  Concerned this may be doxycycline - Antiemetic, GI cocktail - Repeat CT abdomen pelvis> large hiatal hernia, also enlarged biliary ducts. - Labs, concern for euglycemic DKA - She has multiple cardiac risk factors, will assess for cardiac etiology of her epigastric pain as well >trop/EKG wnl  Euglycemic DKA > - recent steroids likely provoking - jardiance   - She is acidotic, bicarb is low, she has ketonuria, anion gap.  Endo tool ordered with further fluid resuscitation  Clinical Course as of 04/16/24 1559  Thu Apr 16, 2024  1313 Hemoglobin(!):  16.1 Favor hemoconcentration, pt w/ poor po here recently. Give IVF [SG]  1436 Glucose(!): 101 [SG]  1436 CO2(!): 19 [SG]  1436 Ketones, ur(!): 80 Patient was recently on steroids, reports her blood sugar has been very high at home on her Dexcom.  She takes Jardiance , concern for euglycemic DKA [SG]  1511 Spoke w/ Dr Cinderella GI, agree w/ MRCP, will see in consult  [SG]  1537 [facility] accepts pt [SG]    Clinical Course User Index [SG] Elnor Jayson LABOR, DO     Gastroenterology to evaluate, MRCP Hospitalist to admit Patient agreeable to the plan               Additional history obtained: -Additional history obtained from na -External records from outside source obtained and reviewed including: Chart review including previous notes, labs, imaging, consultation notes including  Home meds Gi note Prior labs Prior imaging from [facility]   Lab Tests: -I ordered, reviewed, and interpreted labs.   The pertinent results include:   Labs Reviewed  CBC WITH DIFFERENTIAL/PLATELET - Abnormal; Notable for the following components:      Result Value   WBC 10.6 (*)    RBC 5.47 (*)    Hemoglobin 16.1 (*)    HCT 47.0 (*)    Neutro Abs 7.9 (*)    All other components within normal limits  COMPREHENSIVE METABOLIC PANEL WITH GFR - Abnormal; Notable for the following components:   CO2 19 (*)    Glucose,  Bld 101 (*)    AST 42 (*)    Anion gap 20 (*)    All other components within normal limits  URINALYSIS, ROUTINE W REFLEX MICROSCOPIC - Abnormal; Notable for the following components:   Specific Gravity, Urine 1.036 (*)    Glucose, UA >=500 (*)    Hgb urine dipstick SMALL (*)    Ketones, ur 80 (*)    All other components within normal limits  BLOOD GAS, VENOUS - Abnormal; Notable for the following components:   pCO2, Ven 33 (*)    pO2, Ven <31 (*)    Bicarbonate 14.6 (*)    Acid-base deficit 11.8 (*)    All other components within normal limits  LIPASE, BLOOD   BETA-HYDROXYBUTYRIC ACID  BASIC METABOLIC PANEL WITH GFR  BASIC METABOLIC PANEL WITH GFR  BASIC METABOLIC PANEL WITH GFR  BASIC METABOLIC PANEL WITH GFR  CBG MONITORING, ED  TROPONIN T, HIGH SENSITIVITY  TROPONIN T, HIGH SENSITIVITY    Notable for as above  EKG   EKG Interpretation Date/Time:  Thursday April 16 2024 10:36:13 EDT Ventricular Rate:  96 PR Interval:  143 QRS Duration:  86 QT Interval:  367 QTC Calculation: 464 R Axis:   -46  Text Interpretation: Sinus rhythm Left anterior fascicular block Borderline T wave abnormalities Interpretation limited secondary to artifact similar to prev Confirmed by Elnor Savant ([id number]) on 04/16/2024 3:31:49 PM         Imaging Studies ordered: I ordered imaging studies including CTAP, CXR, MRCP I independently visualized the following imaging with scope of interpretation limited to determining acute life threatening conditions related to emergency care; findings noted above I agree with the radiologist interpretation If any imaging was obtained with contrast I closely monitored patient for any possible adverse reaction a/w contrast administration in the emergency department   Medicines ordered and prescription drug management: Meds ordered this encounter  Medications   sodium chloride  0.9 % bolus 1,000 mL   ondansetron  (ZOFRAN ) injection 4 mg   famotidine  (PEPCID ) IVPB 20 mg premix   morphine  (PF) 4 MG/ML injection 4 mg   iohexol  (OMNIPAQUE ) 300 MG/ML solution 100 mL   insulin  regular, human (MYXREDLIN ) 100 units/ 100 mL infusion    EndoTool Goal Range::   140-180    Type of Diabetes:   Type 2    Mode of Therapy:   ENDOX1 for DKA    Start Method:   EndoTool to calculate   lactated ringers  infusion   dextrose  5 % in lactated ringers  infusion   dextrose  50 % solution 0-50 mL   potassium chloride  10 mEq in 100 mL IVPB   lactated ringers  bolus 1,000 mL    -I have reviewed the patients home medicines and have made adjustments  as needed   Consultations Obtained: I requested consultation with the GI, [facility],  and discussed lab and imaging findings as well as pertinent plan    Cardiac Monitoring: The patient was maintained on a cardiac monitor.  I personally viewed and interpreted the cardiac monitored which showed an underlying rhythm of: nsr Continuous pulse oximetry interpreted by myself, 95% on RA.    Social Determinants of Health:  Diagnosis or treatment significantly limited by social determinants of health: na   Reevaluation: After the interventions noted above, I reevaluated the patient and found that they have improved  Co morbidities that complicate the patient evaluation  Past Medical History:  Diagnosis Date   Arthritis    CAD (coronary artery disease)  a. s/p CABG on 04/27/2020 with LIMA-LAD, seq SVG-PDA-PL, seq left radial-RI-OM   Concussion    2015   Depression    Diabetes (HCC)    type 2   Dysphagia    Dysrhythmia    afib   GERD (gastroesophageal reflux disease)    Headache    History of bronchitis    Hypertension    Seasonal allergies    Toenail fungus       Dispostion: Disposition decision including need for hospitalization was considered, and patient admitted to the hospital.    Final Clinical Impression(s) / ED Diagnoses Final diagnoses:  Diabetic ketoacidosis without coma associated with type 2 diabetes mellitus (HCC)  Nausea and vomiting, unspecified vomiting type  Hiatal hernia  Dilation of biliary tract  Dehydration        Elnor Jayson LABOR, DO 04/16/24 1559

## 2024-04-16 NOTE — ED Notes (Signed)
 Pt pulled self off of monitor prior to leaving ED to ICU, not all VS captured. Moved to floor by other RN. Report called to ICU after pt left. 1509 VBG and BMP obtained with new IV start.

## 2024-04-16 NOTE — H&P (Addendum)
 History and Physical    Katrina Castro FMW:[id number] DOB: February 04, 1946 DOA: 04/16/2024  PCP: Rosamond Leta NOVAK, MD   Patient coming from: Home  I have personally briefly reviewed patient's old medical records in [facility]  Chief Complaint: Vomiting, nausea  HPI: Katrina Castro is a 78 y.o. female with medical history significant for atrial fibrillation, diabetes mellitus, CABG, hypertension. Patient presented to the ED with complaints of nausea and multiple episodes daily of vomiting over the past week.  She reports lower abdominal pain.  She denies urinary symptoms.  She has not been able to keep anything down.  Last bowel movement was about 4 to 5 days ago despite taking stool softeners. Started on a course of prednisone  with some antibiotic for upper respiratory tract symptoms and congestion prior to onset of GI symptoms. Blood sugars were up to 500s at home.    ED Course: Temperature 97.9.  Heart rate 83-114.  Respiratory rate 12-24.  Blood pressure systolic 106-180.  O2 sat greater than 94% on room air. Anion gap 20, serum bicarb 19, blood glucose 101.  Troponin < 15 x 2. UA not suggestive of UTI. Portable chest x-ray without acute abnormality. CT abdomen and pelvis with contrast-18 for mild to moderate intra and extrahepatic biliary ductal dilatation no obstructing stone or mass seen.  Possible stricture or mass or Noncalcified stone.  MRCP recommended.  Also significant increase in size of moderate size hiatal hernia. Insulin  drip started for normoglycemic DKA. 2 L bolus given. EDP talked to GI, recommended getting MRCP.  Will see in consult in the morning.  Review of Systems: As per HPI all other systems reviewed and negative.  Past Medical History:  Diagnosis Date   Arthritis    CAD (coronary artery disease)    a. s/p CABG on 04/27/2020 with LIMA-LAD, seq SVG-PDA-PL, seq left radial-RI-OM   Concussion    2015   Depression    Diabetes (HCC)    type 2   Dysphagia     Dysrhythmia    afib   GERD (gastroesophageal reflux disease)    Headache    History of bronchitis    Hypertension    Seasonal allergies    Toenail fungus     Past Surgical History:  Procedure Laterality Date   APPENDECTOMY     BACK SURGERY     x2   BOTOX  INJECTION N/A 06/06/2016   Procedure: BOTOX  INJECTION;  Surgeon: Claudis RAYMOND Rivet, MD;  [facility];  Service: Endoscopy;  Laterality: N/A;   BOTOX  INJECTION N/A 01/17/2021   Procedure: BOTOX  INJECTION;  Surgeon: Eartha Angelia Sieving, MD;  [facility];  Service: Gastroenterology;  Laterality: N/A;   BOTOX  INJECTION N/A 07/11/2021   Procedure: BOTOX  INJECTION;  Surgeon: Eartha Angelia Sieving, MD;  [facility];  Service: Gastroenterology;  Laterality: N/A;   BOTOX  INJECTION N/A 08/21/2022   Procedure: BOTOX  INJECTION;  Surgeon: Eartha Angelia Sieving, MD;  [facility];  Service: Gastroenterology;  Laterality: N/A;   CHOLECYSTECTOMY     COLONOSCOPY N/A 11/05/2012   Procedure: COLONOSCOPY;  Surgeon: Claudis RAYMOND Rivet, MD;  [facility];  Service: Endoscopy;  Laterality: N/A;  1030   COLONOSCOPY N/A 10/08/2023   Procedure: COLONOSCOPY;  Surgeon: Eartha Angelia Sieving, MD;  [facility];  Service: Gastroenterology;  Laterality: N/A;   COLONOSCOPY WITH PROPOFOL  N/A 05/01/2019   Procedure: COLONOSCOPY WITH PROPOFOL ;  Surgeon: Rivet Claudis RAYMOND, MD;  [facility];  Service: Endoscopy;  Laterality: N/A;  11:20am   CORONARY ARTERY BYPASS GRAFT N/A 04/27/2020   Procedure: CORONARY ARTERY BYPASS GRAFTING (CABG) TIMES FIVE USING LEFT INTERNAL MAMMARY ARTERY, LEFT HARVESTED RADIAL ARTERY, RIGHT GREATER SAPHENOUS VEIN HARVESTED ENDOSCOPICALLY.;  Surgeon: German Bartlett PEDLAR, MD;  Location: [facility];  Service: Open Heart Surgery;  Laterality: N/A;   ESOPHAGEAL DILATION N/A 07/06/2015   Procedure: ESOPHAGEAL DILATION;  Surgeon: Claudis RAYMOND Rivet, MD;  [facility];  Service: Endoscopy;  Laterality: N/A;   ESOPHAGEAL DILATION N/A 06/06/2016   Procedure: ESOPHAGEAL DILATION;  Surgeon: Claudis RAYMOND Rivet, MD;  [facility];  Service: Endoscopy;  Laterality: N/A;   ESOPHAGEAL DILATION N/A 08/21/2022   Procedure: ESOPHAGEAL DILATION;  Surgeon: Eartha Angelia Sieving, MD;  [facility];  Service: Gastroenterology;  Laterality: N/A;   ESOPHAGOGASTRODUODENOSCOPY N/A 02/12/2014   Procedure: ESOPHAGOGASTRODUODENOSCOPY (EGD);  Surgeon: Claudis RAYMOND Rivet, MD;  [facility];  Service: Endoscopy;  Laterality: N/A;  150   ESOPHAGOGASTRODUODENOSCOPY N/A 07/06/2015   Procedure: ESOPHAGOGASTRODUODENOSCOPY (EGD);  Surgeon: Claudis RAYMOND Rivet, MD;  [facility];  Service: Endoscopy;  Laterality: N/A;  1:25 - moved to 1/18 @ 10:30 - Ann to notify pt   ESOPHAGOGASTRODUODENOSCOPY N/A 10/08/2023   Procedure: EGD (ESOPHAGOGASTRODUODENOSCOPY);  Surgeon: Eartha Angelia, Sieving, MD;  [facility];  Service: Gastroenterology;  Laterality: N/A;  10:30AM;ASA 3   ESOPHAGOGASTRODUODENOSCOPY (EGD) WITH ESOPHAGEAL DILATION N/A 07/25/2012   Procedure: ESOPHAGOGASTRODUODENOSCOPY (EGD) WITH ESOPHAGEAL DILATION;  Surgeon: Claudis RAYMOND Rivet, MD;  [facility];  Service: Endoscopy;  Laterality: N/A;  325-rescheduled to 855 Ann notified pt   ESOPHAGOGASTRODUODENOSCOPY (EGD) WITH PROPOFOL  N/A 06/06/2016   Procedure: ESOPHAGOGASTRODUODENOSCOPY (EGD) WITH PROPOFOL ;  Surgeon: Claudis RAYMOND Rivet, MD;  [facility];  Service: Endoscopy;  Laterality: N/A;   ESOPHAGOGASTRODUODENOSCOPY (EGD) WITH PROPOFOL  N/A 01/17/2021   Procedure: ESOPHAGOGASTRODUODENOSCOPY (EGD) WITH PROPOFOL ;  Surgeon: Eartha Angelia Sieving, MD;  [facility];  Service: Gastroenterology;  Laterality: N/A;  10:35   ESOPHAGOGASTRODUODENOSCOPY (EGD) WITH PROPOFOL  N/A 07/11/2021   Procedure: ESOPHAGOGASTRODUODENOSCOPY (EGD) WITH PROPOFOL ;  Surgeon: Eartha Angelia Sieving, MD;  [facility];  Service: Gastroenterology;  Laterality: N/A;  11:15   ESOPHAGOGASTRODUODENOSCOPY (EGD) WITH PROPOFOL  N/A 08/21/2022   Procedure: ESOPHAGOGASTRODUODENOSCOPY (EGD) WITH PROPOFOL ;  Surgeon: Eartha Angelia Sieving, MD;  [facility];  Service: Gastroenterology;  Laterality: N/A;  1115AM, ASA 2   EYE SURGERY     cataracts removed   Foot surgeries Bilateral    hammer toes   LEFT HEART CATH AND CORONARY ANGIOGRAPHY N/A 04/26/2020   Procedure: LEFT HEART CATH AND CORONARY ANGIOGRAPHY;  Surgeon: Jordan, Peter M, MD;  Location: [facility] INVASIVE CV LAB;  Service: Cardiovascular;  Laterality: N/A;   LUMBAR LAMINECTOMY/DECOMPRESSION MICRODISCECTOMY Bilateral 11/20/2022   Procedure: Lumbar Five-Sacral One Laminectomy and Foraminotomy;  Surgeon: Louis Shove, MD;  Location: [facility] OR;  Service: Neurosurgery;  Laterality: Bilateral;  3C   MALONEY DILATION N/A 02/12/2014   Procedure: MALONEY DILATION;  Surgeon: Claudis RAYMOND Rivet, MD;  [facility];  Service: Endoscopy;  Laterality: N/A;   POLYPECTOMY  05/01/2019   Procedure: POLYPECTOMY;  Surgeon: Rivet Claudis RAYMOND, MD;  [facility];  Service: Endoscopy;;  colon    RADIAL ARTERY HARVEST Left 04/27/2020   Procedure: LEFT RADIAL ARTERY HARVEST;  Surgeon: German Bartlett PEDLAR, MD;  Location: [facility];  Service: Open Heart Surgery;  Laterality: Left;  Right knee arthroscopy     x2   TEE WITHOUT CARDIOVERSION N/A 04/27/2020   Procedure: TRANSESOPHAGEAL ECHOCARDIOGRAM (TEE);  Surgeon: German Bartlett PEDLAR, MD;  Location: [facility] OR;  Service: Open Heart Surgery;  Laterality: N/A;   TONSILLECTOMY     TOTAL ABDOMINAL HYSTERECTOMY     TOTAL KNEE ARTHROPLASTY Right 04/03/2017   Procedure: RIGHT TOTAL KNEE ARTHROPLASTY;  Surgeon: Heide Ingle, MD;  Location: [facility];  Service: Orthopedics;  Laterality: Right;     reports that she has never smoked. She has never used smokeless tobacco. She reports that she does  not drink alcohol  and does not use drugs.  Allergies  Allergen Reactions   Bee Venom Anaphylaxis   Levofloxacin Anaphylaxis and Rash   Cardizem  [Diltiazem ] Other (See Comments)    Patient states that she could not think, felt that she was in the twilight zone. Arm discomfort.   Sulfa Antibiotics Itching   Diazepam Nausea Only   Lipitor [Atorvastatin] Other (See Comments)    Caused her body to be out of whack  - elevated sugar also.    Family History  Problem Relation Age of Onset   Diabetes Mother    Stroke Father    Diabetes Sister    Colon cancer Neg Hx    Breast cancer Neg Hx     Prior to Admission medications   Medication Sig Start Date End Date Taking? Authorizing Provider  acyclovir  (ZOVIRAX ) 400 MG tablet Take 400 mg by mouth daily.    [provider]  aspirin  81 MG EC tablet Take 1 tablet (81 mg total) by mouth daily. Swallow whole. 07/21/21   Okey Vina GAILS, MD  Continuous Glucose Receiver (DEXCOM G7 RECEIVER) DEVI Use to monitor glucose continuously 03/30/24   Trixie File, MD  Continuous Glucose Sensor (DEXCOM G7 SENSOR) MISC Use to check glucose continuously, change sensor every 10 days 03/30/24   Trixie File, MD  dicyclomine  (BENTYL ) 10 MG capsule Take 1 capsule (10 mg total) by mouth every 12 (twelve) hours as needed. 10/16/23   Eartha Angelia Sieving, MD  empagliflozin  (JARDIANCE ) 25 MG TABS tablet take 1 tablet by mouth daily before breakfast. 12/12/23   Trixie File, MD  ezetimibe  (ZETIA ) 10 MG tablet take 1 tablet once daily. 09/16/23   Miriam Norris, NP  isosorbide  mononitrate (IMDUR ) 30 MG 24 hr tablet take 1 tablet (30 milligram total) by mouth daily. 02/06/24   Okey Vina GAILS, MD  levocetirizine (XYZAL ) 5 MG tablet Take 5 mg by mouth every evening.    [provider]  linaclotide  (LINZESS ) 72 MCG capsule Take 1 capsule (72 mcg total) by mouth daily before breakfast. Patient not taking: Reported on 04/16/2024 10/08/23   Castaneda  Mayorga, Daniel, MD  metFORMIN  (GLUCOPHAGE ) 1000 MG tablet Take 1 tablet (1,000 mg total) by mouth daily with breakfast. 05/09/23   Trixie File, MD  metoprolol  tartrate (LOPRESSOR ) 25 MG tablet take (1/2) tablet by mouth 2 times a day. 09/16/23   Miriam Norris, NP  nitroGLYCERIN  (NITROSTAT ) 0.4 MG SL tablet Place 1 tablet (0.4 mg total) under the tongue every 5 (five) minutes as needed for chest pain. 11/06/21   Okey Vina GAILS, MD  omeprazole  (PRILOSEC) 40 MG capsule take 1 capsule (40 MILLIGRAM total) by mouth daily. 12/09/23   Carlan, Chelsea L, NP  oxybutynin  (DITROPAN -XL) 10 MG 24 hr tablet Take 10 mg by mouth every evening.    [provider]  promethazine  (PHENERGAN ) 12.5 MG tablet Take 1 tablet (12.5 mg  total) by mouth every 8 (eight) hours as needed for nausea or vomiting. 04/15/24   Eartha Angelia Sieving, MD  repaglinide  (PRANDIN ) 0.5 MG tablet Take 2 tablets (1 mg total) by mouth daily before breakfast. Take 2 tablet (0.5 mg total) by mouth daily before breakfast. 03/18/24   Trixie File, MD  rosuvastatin  (CRESTOR ) 40 MG tablet take 1 tablet by mouth once daily. 09/16/23   Miriam Norris, NP    Physical Exam: Vitals:   04/16/24 1442 04/16/24 1445 04/16/24 1500 04/16/24 1515  BP:  130/80    Pulse:  90 93 98  Resp:  (!) 23 (!) 21 13  Temp: 97.9 F (36.6 C)     TempSrc: Oral     SpO2:  97% 98% 93%  Weight:      Height:        Constitutional: NAD, calm, comfortable Vitals:   04/16/24 1442 04/16/24 1445 04/16/24 1500 04/16/24 1515  BP:  130/80    Pulse:  90 93 98  Resp:  (!) 23 (!) 21 13  Temp: 97.9 F (36.6 C)     TempSrc: Oral     SpO2:  97% 98% 93%  Weight:      Height:       Eyes: PERRL, lids and conjunctivae normal ENMT: Mucous membranes are moist.  Neck: normal, supple, no masses, no thyromegaly Respiratory: clear to auscultation bilaterally, no wheezing, no crackles. Normal respiratory effort. No accessory muscle use.  Cardiovascular: Regular  rate and rhythm, no murmurs / rubs / gallops. No extremity edema.  Extremities warm. Abdomen: Moderate lower abdominal tenderness, soft, no guarding, no rebound no masses palpated.  Musculoskeletal: no clubbing / cyanosis. No joint deformity upper and lower extremities.  Skin: no rashes, lesions, ulcers. No induration Neurologic: No facial asymmetry, moves extremities spontaneously, speech fluent. Psychiatric: Normal judgment and insight. Alert and oriented x 3. Normal mood.   Labs on Admission: I have personally reviewed following labs and imaging studies  CBC: Recent Labs  Lab 04/16/24 1125  WBC 10.6*  NEUTROABS 7.9*  HGB 16.1*  HCT 47.0*  MCV 85.9  PLT 232   Basic Metabolic Panel: Recent Labs  Lab 04/16/24 1125  NA 142  K 3.6  CL 102  CO2 19*  GLUCOSE 101*  BUN 14  CREATININE 0.55  CALCIUM  10.3   GFR: Estimated Creatinine Clearance: 46.6 mL/min (by C-G formula based on SCr of 0.55 mg/dL). Liver Function Tests: Recent Labs  Lab 04/16/24 1125  AST 42*  ALT 30  ALKPHOS 90  BILITOT 0.4  PROT 7.7  ALBUMIN  4.7   Recent Labs  Lab 04/16/24 1125  LIPASE 14   CBG: Recent Labs  Lab 04/16/24 1514  GLUCAP 88   Urine analysis:    Component Value Date/Time   COLORURINE YELLOW 04/16/2024 1305   APPEARANCEUR CLEAR 04/16/2024 1305   LABSPEC 1.036 (H) 04/16/2024 1305   PHURINE 5.0 04/16/2024 1305   GLUCOSEU >=500 (A) 04/16/2024 1305   HGBUR SMALL (A) 04/16/2024 1305   BILIRUBINUR NEGATIVE 04/16/2024 1305   KETONESUR 80 (A) 04/16/2024 1305   PROTEINUR NEGATIVE 04/16/2024 1305   NITRITE NEGATIVE 04/16/2024 1305   LEUKOCYTESUR NEGATIVE 04/16/2024 1305    Radiological Exams on Admission: CT ABDOMEN PELVIS W CONTRAST Result Date: 04/16/2024 CLINICAL DATA:  Epigastric abdominal pain with nausea and vomiting for the past week. EXAM: CT ABDOMEN AND PELVIS WITH CONTRAST TECHNIQUE: Multidetector CT imaging of the abdomen and pelvis was performed using the standard  protocol following bolus  administration of intravenous contrast. RADIATION DOSE REDUCTION: This exam was performed according to the departmental dose-optimization program which includes automated exposure control, adjustment of the mA and/or kV according to patient size and/or use of iterative reconstruction technique. CONTRAST:  OMNIPAQUE  IOHEXOL  300 MG/ML  SOLN COMPARISON:  09/24/2023 and 05/12/2020 FINDINGS: Lower chest: Moderate-sized hiatal hernia with a paraesophageal component and an interval significant increase in size. Normal sized heart. 3 mm left lower lobe nodule on image number 2/4, unchanged since 05/12/2020, not requiring imaging follow-up. Similar right lower lobe nodule on image number 1/4, also unchanged and not needing imaging follow-up. Minimal bilateral dependent atelectasis. Hepatobiliary: Cholecystectomy clips are again demonstrated with interval mild-to-moderate intrahepatic and extrahepatic biliary ductal dilatation with no obstructing stone or mass seen. The common duct currently measures 12 mm in maximum diameter on image number 22/4, previously 7 mm on 09/24/2023. Pancreas: Stable moderate atrophy of the pancreas with minimally progressive pancreatic ductal dilatation. Spleen: Normal in size without focal abnormality. Adrenals/Urinary Tract: Adrenal glands are unremarkable. Kidneys are normal, without renal calculi, focal lesion, or hydronephrosis. Bladder is unremarkable. Stomach/Bowel: Moderate-sized hiatal hernia with a paraesophageal component and an interval significant increase in size. Surgically absent appendix. Unremarkable colon and small bowel. Vascular/Lymphatic: Atheromatous arterial calcifications without aneurysm. No enlarged lymph nodes. Reproductive: Status post hysterectomy. No adnexal masses. Other: No abdominal wall hernia or abnormality. No abdominopelvic ascites. Musculoskeletal: Lumbar and lower thoracic spine degenerative changes. IMPRESSION: 1. Interval  mild-to-moderate intrahepatic and extrahepatic biliary ductal dilatation with no obstructing stone or mass seen. This could be due to a stricture, occult mass or noncalcified stone. This could be further evaluated with MRCP. 2. Moderate-sized hiatal hernia with a paraesophageal component and an interval significant increase in size. 3. Stable moderate atrophy of the pancreas with minimally progressive pancreatic ductal dilatation. Electronically Signed   By: Elspeth Bathe M.D.   On: 04/16/2024 14:20   DG Chest Portable 1 View Result Date: 04/16/2024 CLINICAL DATA:  Chest pain and nausea for the past 2 weeks. Diarrhea. EXAM: PORTABLE CHEST 1 VIEW COMPARISON:  03/18/2023 FINDINGS: Normal sized heart. Stable post CABG changes. Clear lungs with normal vascularity. Small to moderate-sized hiatal hernia. Diffuse osteopenia. IMPRESSION: 1. No acute abnormality. 2. Small to moderate-sized hiatal hernia. Electronically Signed   By: Elspeth Bathe M.D.   On: 04/16/2024 12:35   EKG: Independently reviewed.  Sinus rhythm, rate 96, QTc 464.  LAFB.  No significant change from prior.  Assessment/Plan Principal Problem:   Intractable vomiting Active Problems:   Diabetes (HCC)   Atrial fibrillation (HCC)   S/P CABG x 5  Assessment and Plan:  Intractable vomiting, likely normoglycemic DKA   Blood glucose 101, serum bicarb of 19, anion gap of 20.  BHB elevated at 4.  VBG with normal pH of 7.25.  A1c 02/26/2024-7.5.  Was recently started on steroids for respiratory tract symptoms.  Blood sugars up to 500s at home. - Started on insulin  drip, continue - Follow-up BHB - 2 L bolus given - Continue D5 LR 100cc/hr - Hold Jardiance , metformin , rapaglinide - BMP Q4 hourly - Addendum-gap closed anion gap 14, serum bicarb now 22.  DC insulin  drip, start sliding scale insulin , diet ordered.  DC D5 LR, continue with LR at 75 cc/h for hydration.   Biliary ductal dilation - CT [facility]-mild to moderate intra and extrahepatic  biliary ductal dilatation, no stone or mass seen, possible stricture, occult mass or noncalcified stone.  - MRCP recommended and pending -  EDP spoke to Dr. Cinderella, will see in consult in a.m.  Hiatal hernia-CT shows moderate-sized hiatal hernia with a paraesophageal component and an interval significant increase in size.  Atrial fibrillation-postop- CABG.  Currently in sinus rhythm.  Not on anticoagulation. - Resume metoprolol   Diabetes mellitus - Plan per above  CABGx 5- stable.  No chest pain.  Troponin less than 15 x 2.  No significant change in EKG from prior. - Resume aspirin , Imdur , metoprolol , Crestor   Hypertension-stable. -Resume Imdur , metoprolol    DVT prophylaxis: Lovenox  Code Status: Full code Family Communication: Daughter Tammy at bedside Disposition Plan: ~ 2 days Consults called:  none Admission status: inpt stepdown I certify that at the point of admission it is my clinical judgment that the patient will require inpatient hospital care spanning beyond 2 midnights from the point of admission due to high intensity of service, high risk for further deterioration and high frequency of surveillance required.   Author: Tully FORBES Carwin, MD 04/16/2024 10:50 PM  For on call review [email or web address].

## 2024-04-16 NOTE — ED Notes (Addendum)
 This tech in room to assist pt to the [facility], pt sat up in bed with a max assist, when pt stood up she could not bear weight and fell backwards on the bed with assist from this tech, pt refused to use bedpan, attempted to get up to [facility] with assist x 2 and was successful with the transfer, pt still very unsteady. RN and MD notified

## 2024-04-16 NOTE — Patient Instructions (Signed)
 I am recommending you go back to the ER () given you are unable to stand without assistance and feeling dizzy as I am concerned you are dehydrated

## 2024-04-16 NOTE — ED Notes (Signed)
 Date and time results received: 04/16/24 1552   Test: VBG pO2 Critical Value: <  31  Name of Provider Notified: Elnor Savant, MD

## 2024-04-16 NOTE — ED Notes (Signed)
 Talking on phone, alert, NAD, calm, interactive. Pain improved, nausea remains.

## 2024-04-16 NOTE — Progress Notes (Addendum)
 Referring Provider: Rosamond Leta NOVAK, MD Primary Care Physician:  Rosamond Leta NOVAK, MD Primary GI Physician: Dr. Eartha   Chief Complaint  Patient presents with   Hospitalization Follow-up    She presented in the onset of nausea, vomiting and poor oral intake, with subsequent constipation.  Went to [facility] to have further evaluation and had a CT of the abdomen that showed presence of small to moderate amount of stool in her colon.  No obstruction or abnormalities.  She was given Dulcolax and advised to take MiraLAX .  States that vomiting has improved but nausea still better despite taking Zofran .  Still feels constipated.  I advised her to uptitrate her MiraLAX  to 2-3 times per day.   HPI:   Katrina Castro is a 78 y.o. female with past medical history of  c. Difficile, coronary artery disease status post CABG, hypertension, GERD, chronic dysphagia initially thought to be secondary to type III achalasia but repeat manometry was negative for achalasia, type 2 diabetes and depression   Patient presenting today for:  Constipation  Nausea and vomiting   Last seen in may, at that time feeling much better, taking linzess  in the morning, having 1-2 BMs per day. No abdominal pain. Taking prilosec BID, recommended to have repeat colonoscopy (due to inadequate prep) but patient declined  Recommended continue linzes 72mcg daily, start daily probiotic, miralax  PRN, repeat colonoscopy, continue omeprazole  40mg  BID, restart iron pill, repeat iron studies in July, high fiber diet, good water  intake  Iron studies not repeated in July  Hgb on 10/27 was 15.7  Patient called yesterday and spoke with Dr. Eartha, she reported onset of nausea, vomiting and poor oral intake, with subsequent constipation. Went to [facility] to have further evaluation and had a CT of the abdomen that showed presence of small to moderate amount of stool in her colon. No obstruction or abnormalities. She was given  Dulcolax and advised to take MiraLAX . States that vomiting has improved but nausea still better despite taking Zofran . Still feels constipated. advised to uptitrate her MiraLAX  to 2-3 times per day and we will send a prescription for Phenergan . If her symptoms worsen or persist, she will need to be seen earlier in the ER.   Present:  States for that she saw Dr. Rosamond and was given a shot of steroids and was given antibiotic and prednisone  for a sinus infection, she states she begin having vomiting after she tried to take the antibiotic, she was then given a z pak which drove her blood sugar up and made her dizzy and have more vomiting. States that she was previously taking linzess  and bowels were moving good but once she started vomiting she was not taking any of her medications. She is having very small BMs, she is taking 1 capful of miralax , she did not increase this per Dr. Samuel recommendations. She endorses a lot of belching. Was able to eat a few bites of cracker last night. She has been drinking some ginger ale and took her prilosec as she was having some heartburn, unclear if she is taking phenergan . Patient actively heaving during our encounter and unable to stand up without assistance. She is urinating but urine is dark yellow. She is on keflex currently, given this in the ER. she notes that she feels off balance when walking. She endorses dizziness.    Last Colonoscopy:10/08/23 - Preparation of the colon was inadequate.                           -  Hemorrhoids found on perianal exam.                           - Stool in the sigmoid colon, in the transverse                            colon and in the cecum.                           - Diverticulosis in the sigmoid colon.                           - The distal rectum and anal verge are normal on                            retroflexion view.                           - No specimens collected.   Last Endoscopy:10/08/23 - 3 cm hiatal hernia.                            - A few bleeding angiodysplastic lesions in the                            stomach. Treated with argon plasma coagulation                            ([facility]). Nomal stomach biopsied.                           - Normal examined duodenum.  Filed Weights   04/16/24 0933  Weight: 127 lb (57.6 kg)     Past Medical History:  Diagnosis Date   Arthritis    CAD (coronary artery disease)    a. s/p CABG on 04/27/2020 with LIMA-LAD, seq SVG-PDA-PL, seq left radial-RI-OM   Concussion    2015   Depression    Diabetes (HCC)    type 2   Dysphagia    Dysrhythmia    afib   GERD (gastroesophageal reflux disease)    Headache    History of bronchitis    Hypertension    Seasonal allergies    Toenail fungus     Past Surgical History:  Procedure Laterality Date   APPENDECTOMY     BACK SURGERY     x2   BOTOX  INJECTION N/A 06/06/2016   Procedure: BOTOX  INJECTION;  Surgeon: Claudis RAYMOND Rivet, MD;  [facility];  Service: Endoscopy;  Laterality: N/A;   BOTOX  INJECTION N/A 01/17/2021   Procedure: BOTOX  INJECTION;  Surgeon: Eartha Angelia Sieving, MD;  [facility];  Service: Gastroenterology;  Laterality: N/A;   BOTOX  INJECTION N/A 07/11/2021   Procedure: BOTOX  INJECTION;  Surgeon: Eartha Angelia Sieving, MD;  [facility];  Service: Gastroenterology;  Laterality: N/A;   BOTOX  INJECTION N/A 08/21/2022   Procedure: BOTOX  INJECTION;  Surgeon: Eartha Angelia Sieving, MD;  [facility];  Service: Gastroenterology;  Laterality: N/A;   CHOLECYSTECTOMY     COLONOSCOPY N/A 11/05/2012   Procedure: COLONOSCOPY;  Surgeon: Claudis  RAYMOND Rivet, MD;  [facility];  Service: Endoscopy;  Laterality: N/A;  1030   COLONOSCOPY N/A 10/08/2023   Procedure: COLONOSCOPY;  Surgeon: Eartha Angelia Sieving, MD;  [facility];  Service: Gastroenterology;  Laterality: N/A;   COLONOSCOPY WITH PROPOFOL  N/A 05/01/2019   Procedure:  COLONOSCOPY WITH PROPOFOL ;  Surgeon: Rivet Claudis RAYMOND, MD;  [facility];  Service: Endoscopy;  Laterality: N/A;  11:20am   CORONARY ARTERY BYPASS GRAFT N/A 04/27/2020   Procedure: CORONARY ARTERY BYPASS GRAFTING (CABG) TIMES FIVE USING LEFT INTERNAL MAMMARY ARTERY, LEFT HARVESTED RADIAL ARTERY, RIGHT GREATER SAPHENOUS VEIN HARVESTED ENDOSCOPICALLY.;  Surgeon: German Bartlett PEDLAR, MD;  Location: [facility];  Service: Open Heart Surgery;  Laterality: N/A;   ESOPHAGEAL DILATION N/A 07/06/2015   Procedure: ESOPHAGEAL DILATION;  Surgeon: Claudis RAYMOND Rivet, MD;  [facility];  Service: Endoscopy;  Laterality: N/A;   ESOPHAGEAL DILATION N/A 06/06/2016   Procedure: ESOPHAGEAL DILATION;  Surgeon: Claudis RAYMOND Rivet, MD;  [facility];  Service: Endoscopy;  Laterality: N/A;   ESOPHAGEAL DILATION N/A 08/21/2022   Procedure: ESOPHAGEAL DILATION;  Surgeon: Eartha Angelia Sieving, MD;  [facility];  Service: Gastroenterology;  Laterality: N/A;   ESOPHAGOGASTRODUODENOSCOPY N/A 02/12/2014   Procedure: ESOPHAGOGASTRODUODENOSCOPY (EGD);  Surgeon: Claudis RAYMOND Rivet, MD;  [facility];  Service: Endoscopy;  Laterality: N/A;  150   ESOPHAGOGASTRODUODENOSCOPY N/A 07/06/2015   Procedure: ESOPHAGOGASTRODUODENOSCOPY (EGD);  Surgeon: Claudis RAYMOND Rivet, MD;  [facility];  Service: Endoscopy;  Laterality: N/A;  1:25 - moved to 1/18 @ 10:30 - Ann to notify pt   ESOPHAGOGASTRODUODENOSCOPY N/A 10/08/2023   Procedure: EGD (ESOPHAGOGASTRODUODENOSCOPY);  Surgeon: Eartha Angelia, Sieving, MD;  [facility];  Service: Gastroenterology;  Laterality: N/A;  10:30AM;ASA 3   ESOPHAGOGASTRODUODENOSCOPY (EGD) WITH ESOPHAGEAL DILATION N/A 07/25/2012   Procedure: ESOPHAGOGASTRODUODENOSCOPY (EGD) WITH ESOPHAGEAL DILATION;  Surgeon: Claudis RAYMOND Rivet, MD;  [facility];  Service: Endoscopy;  Laterality: N/A;  325-rescheduled to 855 Ann notified pt   ESOPHAGOGASTRODUODENOSCOPY  (EGD) WITH PROPOFOL  N/A 06/06/2016   Procedure: ESOPHAGOGASTRODUODENOSCOPY (EGD) WITH PROPOFOL ;  Surgeon: Claudis RAYMOND Rivet, MD;  [facility];  Service: Endoscopy;  Laterality: N/A;   ESOPHAGOGASTRODUODENOSCOPY (EGD) WITH PROPOFOL  N/A 01/17/2021   Procedure: ESOPHAGOGASTRODUODENOSCOPY (EGD) WITH PROPOFOL ;  Surgeon: Eartha Angelia Sieving, MD;  [facility];  Service: Gastroenterology;  Laterality: N/A;  10:35   ESOPHAGOGASTRODUODENOSCOPY (EGD) WITH PROPOFOL  N/A 07/11/2021   Procedure: ESOPHAGOGASTRODUODENOSCOPY (EGD) WITH PROPOFOL ;  Surgeon: Eartha Angelia Sieving, MD;  [facility];  Service: Gastroenterology;  Laterality: N/A;  11:15   ESOPHAGOGASTRODUODENOSCOPY (EGD) WITH PROPOFOL  N/A 08/21/2022   Procedure: ESOPHAGOGASTRODUODENOSCOPY (EGD) WITH PROPOFOL ;  Surgeon: Eartha Angelia Sieving, MD;  [facility];  Service: Gastroenterology;  Laterality: N/A;  1115AM, ASA 2   EYE SURGERY     cataracts removed   Foot surgeries Bilateral    hammer toes   LEFT HEART CATH AND CORONARY ANGIOGRAPHY N/A 04/26/2020   Procedure: LEFT HEART CATH AND CORONARY ANGIOGRAPHY;  Surgeon: Jordan, Peter M, MD;  Location: [facility] INVASIVE CV LAB;  Service: Cardiovascular;  Laterality: N/A;   LUMBAR LAMINECTOMY/DECOMPRESSION MICRODISCECTOMY Bilateral 11/20/2022   Procedure: Lumbar Five-Sacral One Laminectomy and Foraminotomy;  Surgeon: Louis Shove, MD;  Location: [facility] OR;  Service: Neurosurgery;  Laterality: Bilateral;  3C   MALONEY DILATION N/A 02/12/2014   Procedure: MALONEY DILATION;  Surgeon: Claudis RAYMOND Rivet, MD;  [facility];  Service: Endoscopy;  Laterality: N/A;   POLYPECTOMY  05/01/2019   Procedure: POLYPECTOMY;  Surgeon: Golda Claudis PENNER, MD;  [facility];  Service: Endoscopy;;  colon    RADIAL ARTERY HARVEST Left 04/27/2020   Procedure: LEFT RADIAL ARTERY HARVEST;  Surgeon: German Bartlett PEDLAR, MD;  Location: [facility];  Service: Open Heart Surgery;   Laterality: Left;   Right knee arthroscopy     x2   TEE WITHOUT CARDIOVERSION N/A 04/27/2020   Procedure: TRANSESOPHAGEAL ECHOCARDIOGRAM (TEE);  Surgeon: German Bartlett PEDLAR, MD;  Location: [facility] OR;  Service: Open Heart Surgery;  Laterality: N/A;   TONSILLECTOMY     TOTAL ABDOMINAL HYSTERECTOMY     TOTAL KNEE ARTHROPLASTY Right 04/03/2017   Procedure: RIGHT TOTAL KNEE ARTHROPLASTY;  Surgeon: Heide Ingle, MD;  Location: [facility];  Service: Orthopedics;  Laterality: Right;    Current Outpatient Medications  Medication Sig Dispense Refill   acyclovir  (ZOVIRAX ) 400 MG tablet Take 400 mg by mouth daily.     aspirin  81 MG EC tablet Take 1 tablet (81 mg total) by mouth daily. Swallow whole. 30 tablet 11   Continuous Glucose Receiver (DEXCOM G7 RECEIVER) DEVI Use to monitor glucose continuously 1 each 0   Continuous Glucose Sensor (DEXCOM G7 SENSOR) MISC Use to check glucose continuously, change sensor every 10 days 9 each 3   empagliflozin  (JARDIANCE ) 25 MG TABS tablet take 1 tablet by mouth daily before breakfast. 90 tablet 2   ezetimibe  (ZETIA ) 10 MG tablet take 1 tablet once daily. 90 tablet 2   isosorbide  mononitrate (IMDUR ) 30 MG 24 hr tablet take 1 tablet (30 milligram total) by mouth daily. 90 tablet 0   levocetirizine (XYZAL ) 5 MG tablet Take 5 mg by mouth every evening.     metFORMIN  (GLUCOPHAGE ) 1000 MG tablet Take 1 tablet (1,000 mg total) by mouth daily with breakfast. 90 tablet 3   metoprolol  tartrate (LOPRESSOR ) 25 MG tablet take (1/2) tablet by mouth 2 times a day. 90 tablet 2   nitroGLYCERIN  (NITROSTAT ) 0.4 MG SL tablet Place 1 tablet (0.4 mg total) under the tongue every 5 (five) minutes as needed for chest pain. 25 tablet 3   omeprazole  (PRILOSEC) 40 MG capsule take 1 capsule (40 MILLIGRAM total) by mouth daily. 90 capsule 3   oxybutynin  (DITROPAN -XL) 10 MG 24 hr tablet Take 10 mg by mouth every evening.     repaglinide  (PRANDIN ) 0.5 MG tablet Take 2 tablets (1 mg total) by mouth  daily before breakfast. Take 2 tablet (0.5 mg total) by mouth daily before breakfast. 180 tablet 1   rosuvastatin  (CRESTOR ) 40 MG tablet take 1 tablet by mouth once daily. 90 tablet 2   dicyclomine  (BENTYL ) 10 MG capsule Take 1 capsule (10 mg total) by mouth every 12 (twelve) hours as needed. 180 capsule 1   linaclotide  (LINZESS ) 72 MCG capsule Take 1 capsule (72 mcg total) by mouth daily before breakfast. (Patient not taking: Reported on 04/16/2024) 90 capsule 3   promethazine  (PHENERGAN ) 12.5 MG tablet Take 1 tablet (12.5 mg total) by mouth every 8 (eight) hours as needed for nausea or vomiting. 30 tablet 0   No current facility-administered medications for this visit.    Allergies as of 04/16/2024 - Review Complete 04/16/2024  Allergen Reaction Noted   Bee venom Anaphylaxis 08/05/2015   Levofloxacin Anaphylaxis and Rash 04/03/2017   Cardizem  [diltiazem ] Other (See Comments) 08/17/2015   Sulfa antibiotics Itching 07/13/2014   Diazepam Nausea Only 04/03/2017   Lipitor [  atorvastatin] Other (See Comments) 10/12/2019    Social History   Socioeconomic History   Marital status: Divorced    Spouse name: Not on file   Number of children: Not on file   Years of education: Not on file   Highest education level: Not on file  Occupational History   Not on file  Tobacco Use   Smoking status: Never   Smokeless tobacco: Never  Vaping Use   Vaping status: Never Used  Substance and Sexual Activity   Alcohol  use: No    Alcohol /week: 0.0 standard drinks of alcohol     Comment: socially    Drug use: No   Sexual activity: Not Currently    Partners: Male    Birth control/protection: None  Other Topics Concern   Not on file  Social History Narrative   Not on file   Social Drivers of Health   Financial Resource Strain: Not on file  Food Insecurity: Not on file  Transportation Needs: Not on file  Physical Activity: Not on file  Stress: Not on file  Social Connections: Not on file     Review of systems General: negative for malaise, night sweats, fever, chills, weight loss Neck: Negative for lumps, goiter, pain and significant neck swelling Resp: Negative for cough, wheezing, dyspnea at rest CV: Negative for chest pain, leg swelling, palpitations, orthopnea GI: denies melena, hematochezia, diarrhea, dysphagia, odyonophagia, early satiety or unintentional weight loss. +nausea/vomiting +constipation  MSK: Negative for joint pain or swelling, back pain, and muscle pain. Derm: Negative for itching or rash Psych: Denies depression, anxiety, memory loss, confusion. No homicidal or suicidal ideation.  Heme: Negative for prolonged bleeding, bruising easily, and swollen nodes. Endocrine: Negative for cold or heat intolerance, polyuria, polydipsia and goiter. Neuro: negative for tremor, syncope and seizures. +dizziness  The remainder of the review of systems is noncontributory.  Physical Exam: BP 137/81 (BP Location: Left Arm, Patient Position: Sitting, Cuff Size: Normal)   Pulse (!) 103   Temp (!) 97.5 F (36.4 C) (Temporal)   Ht 5' 2.5 (1.588 m)   Wt 127 lb (57.6 kg)   BMI 22.86 kg/m  General:   Alert and oriented. No distress noted. Pleasant and cooperative.  Head:  Normocephalic and atraumatic. Eyes:  Conjuctiva clear without scleral icterus. Mouth:  Oral mucosa pink and dry. Good dentition. No lesions. Heart: Normal rate and rhythm, s1 and s2 heart sounds present.  Lungs: Clear lung sounds in all lobes. Respirations equal and unlabored. Abdomen:  +BS, soft,  and non-distended. No rebound or guarding. No HSM or masses noted. Abdomen diffusely tender to palpation  Derm: No palmar erythema or jaundice, skin is pale Msk:  Symmetrical without gross deformities. Normal posture. Extremities:  Without edema. Neurologic:  Alert and  oriented x4 Psych:  Alert and cooperative. Normal mood and affect.  Invalid input(s): 6 MONTHS   ASSESSMENT: Katrina Castro is a  78 y.o. female presenting today for nausea, vomiting and constipation  Patient somewhat of a poor historian, reports treatment for recent sinus infection with an unspecified antibiotic, prednisone  a steroid shot then transitioned to z pak. She then began having nausea and vomiting. Seen in the ER at [facility] with CT A/P showing a hiatal hernia and some stool in the colon. She is not taking her linzess  currently, only taking 1 capful miralax  daily despite other recommendations. Unclear if she is taking phenergan  or not but did take her PPI. During our encounter patient endorsed dizziness, she was actively  dry heaving and unable to stand without assistance, exhibiting unsteady gait. Main concern at this time is she is dehydrated from vomiting. Her HR was mildly elevated at 103. I have recommended she go back to the ER for further evaluation ([facility]). She did drive herself here today, I advised against her driving to the ER, patient refused EMS transportation. She was transported via wheelchair to the ER by my nursing staff.    PLAN:  -proceed to the ER for further evaluation due to concern for dehydration  All questions were answered, patient verbalized understanding and is in agreement with plan as outlined above.   Follow Up: 2-3 weeks   Cas Tracz L. Mariette, MSN, APRN, AGNP-C Adult-Gerontology Nurse Practitioner [facility]  I have reviewed the note and agree with the [facility]'s assessment as described in this progress note  Toribio Fortune, MD Gastroenterology and Hepatology [facility] Gastroenterology

## 2024-04-17 ENCOUNTER — Inpatient Hospital Stay (HOSPITAL_COMMUNITY)

## 2024-04-17 ENCOUNTER — Telehealth: Payer: Self-pay | Admitting: Gastroenterology

## 2024-04-17 ENCOUNTER — Encounter (HOSPITAL_COMMUNITY): Payer: Self-pay | Admitting: Internal Medicine

## 2024-04-17 DIAGNOSIS — K838 Other specified diseases of biliary tract: Secondary | ICD-10-CM

## 2024-04-17 DIAGNOSIS — R112 Nausea with vomiting, unspecified: Secondary | ICD-10-CM | POA: Diagnosis not present

## 2024-04-17 DIAGNOSIS — R1110 Vomiting, unspecified: Secondary | ICD-10-CM | POA: Diagnosis not present

## 2024-04-17 DIAGNOSIS — E1110 Type 2 diabetes mellitus with ketoacidosis without coma: Secondary | ICD-10-CM | POA: Diagnosis not present

## 2024-04-17 DIAGNOSIS — E860 Dehydration: Secondary | ICD-10-CM

## 2024-04-17 LAB — BASIC METABOLIC PANEL WITH GFR
Anion gap: 15 (ref 5–15)
BUN: 5 mg/dL — ABNORMAL LOW (ref 8–23)
CO2: 22 mmol/L (ref 22–32)
Calcium: 9 mg/dL (ref 8.9–10.3)
Chloride: 103 mmol/L (ref 98–111)
Creatinine, Ser: 0.5 mg/dL (ref 0.44–1.00)
GFR, Estimated: >60 mL/min (ref >60)
Glucose, Bld: 70 mg/dL (ref 70–99)
Potassium: 3.8 mmol/L (ref 3.5–5.1)
Sodium: 139 mmol/L (ref 135–145)

## 2024-04-17 LAB — GLUCOSE, CAPILLARY
Glucose-Capillary: 78 mg/dL (ref 70–99)
Glucose-Capillary: 98 mg/dL (ref 70–99)
Glucose-Capillary: 79 mg/dL (ref 70–99)
Glucose-Capillary: 97 mg/dL (ref 70–99)

## 2024-04-17 MED ORDER — LACTATED RINGERS IV SOLN: INTRAVENOUS | Status: DC

## 2024-04-17 MED ORDER — PANTOPRAZOLE SODIUM 40 MG IV SOLR
40.0000 mg | Freq: Two times a day (BID) | INTRAVENOUS | Status: DC
  Administered 2024-04-17 – 2024-04-18 (×3): 40 mg via INTRAVENOUS
  Filled 2024-04-17 (×3): qty 10

## 2024-04-17 MED ORDER — GADOBUTROL 1 MMOL/ML IV SOLN
6.0000 mL | Freq: Once | INTRAVENOUS | Status: AC | PRN
  Administered 2024-04-17: 6 mL via INTRAVENOUS

## 2024-04-17 MED ORDER — ONDANSETRON HCL 4 MG/2ML IJ SOLN: 4.0000 mg | Freq: Four times a day (QID) | INTRAMUSCULAR | Status: DC | PRN

## 2024-04-17 MED ORDER — PROCHLORPERAZINE EDISYLATE 10 MG/2ML IJ SOLN
5.0000 mg | Freq: Once | INTRAMUSCULAR | Status: AC
  Administered 2024-04-17: 5 mg via INTRAVENOUS
  Filled 2024-04-17: qty 2

## 2024-04-17 NOTE — Progress Notes (Signed)
 Gastroenterology Progress Note   Referring Provider: No ref. provider found Primary Care Physician:  Rosamond Leta NOVAK, MD Primary Gastroenterologist: Toribio Rubins, MD  Patient ID: Katrina Castro [facility]; [id number]; [birth year]    Subjective   Feeling improved today. No nausea or vomiting. No abdominal pain. States at home linzess  was working well for her but given she was not keeping anything down she stopped the linzess , tried miralax  but was not keeping much down. Tolerated clear liquids for breakfast.   Objective   Vital signs in last 24 hours Temp:  [97.5 F (36.4 C)-98.3 F (36.8 C)] 98.1 F (36.7 C) (10/31 0732) Pulse Rate:  [66-114] 77 (10/31 0808) Resp:  [12-24] 15 (10/31 0808) BP: (106-180)/(46-128) 111/54 (10/31 0808) SpO2:  [93 %-100 %] 98 % (10/31 0808) Weight:  [57 kg-58.8 kg] 58.8 kg (10/30 1630) Last BM Date : 04/15/24  Physical Exam General:   Alert and oriented, pleasant Head:  Normocephalic and atraumatic. Eyes:  No icterus, sclera clear. Conjuctiva pink.  Mouth:  Without lesions, mucosa pink and moist.  Neck:  Supple, without thyromegaly or masses.  Abdomen:  non-distended. Msk:  Symmetrical without gross deformities. Normal posture. Extremities:  Without clubbing or edema. Neurologic:  Alert and  oriented x4;  grossly normal neurologically. Skin:  Warm and dry, intact without significant lesions.  Psych:  Alert and cooperative. Normal mood and affect.  Intake/Output from previous day: 10/30 0701 - 10/31 0700 In: 3905.6 [P.O.:120; I.V.:1464.8; IV Piggyback:2320.8] Out: 450 [Urine:450] Intake/Output this shift: Total I/O In: 231.1 [I.V.:231.1] Out: -   Lab Results  Recent Labs    04/16/24 1125  WBC 10.6*  HGB 16.1*  HCT 47.0*  PLT 232   BMET Recent Labs    04/16/24 1710 04/16/24 2115 04/17/24 0458  NA 139 138 139  K 3.6 3.5 3.8  CL 103 103 103  CO2 22 21* 22  GLUCOSE 117* 79 70  BUN 9 7* 5*  CREATININE 0.50 0.48 0.50   CALCIUM  8.7* 8.9 9.0   LFT Recent Labs    04/16/24 1125  PROT 7.7  ALBUMIN  4.7  AST 42*  ALT 30  ALKPHOS 90  BILITOT 0.4   PT/INR No results for input(s): LABPROT, INR in the last 72 hours. Hepatitis Panel No results for input(s): HEPBSAG, HCVAB, HEPAIGM, HEPBIGM in the last 72 hours.  Studies/Results MR ABDOMEN MRCP W WO CONTAST Result Date: 04/17/2024 EXAM: MRCP WITHOUT AND WITH IV CONTRAST 04/17/2024 08:49:42 AM TECHNIQUE: Multisequence, multiplanar magnetic resonance images of the abdomen without and with intravenous contrast. MRCP sequences were performed. COMPARISON: CT abdomen/pelvis dated 04/16/2024 and MRI abdomen dated 01/10/2022. CLINICAL HISTORY: biliary ductal dilation on CT FINDINGS: LIVER: Normal size liver. Normal liver configuration. No liver masses. No significant hepatic steatosis. GALLBLADDER AND BILIARY SYSTEM: Cholecystectomy. Mild diffuse intrahepatic biliary ductal dilatation is not substantially changed from 01/10/2022 MRI. Common bile duct diameter 10 mm, unchanged from prior MRI. No choledocholithiasis. No biliary masses or strictures on MRCP sequences. SPLEEN: Unremarkable. PANCREAS/PANCREATIC DUCT: Visualized pancreas is unremarkable. No pancreatic ductal dilatation. ADRENAL GLANDS: Unremarkable. KIDNEYS: Unremarkable. LYMPH NODES: No enlarged abdominal lymph nodes. VASCULATURE: Atherosclerotic nonaneurysmal abdominal aorta. PERITONEUM: No ascites. ABDOMINAL WALL: No hernia. No mass. BOWEL: Small to moderate hiatal hernia. Otherwise normal nondistended stomach. Grossly unremarkable visualized bowel. No bowel obstruction. BONES: No acute abnormality or worrisome osseous lesion. SOFT TISSUES: Unremarkable. MISCELLANEOUS: Unremarkable. IMPRESSION: 1. Cholecystectomy. 2. Chronic mild diffuse intrahepatic and extrahepatic biliary ductal dilatation, unchanged from 2023 MRI, most compatible with  chronic postcholecystectomy effect. No choledocholithiasis or  biliary mass/stricture on MRCP sequences. 3. Aortic Atherosclerosis (ICD10-I70.0). Electronically signed by: Selinda Blue MD 04/17/2024 09:05 AM EDT RP Workstation: [id number]   MR 3D Recon At Scanner Result Date: 04/17/2024 EXAM: MRCP WITHOUT AND WITH IV CONTRAST 04/17/2024 08:49:42 AM TECHNIQUE: Multisequence, multiplanar magnetic resonance images of the abdomen without and with intravenous contrast. MRCP sequences were performed. COMPARISON: CT abdomen/pelvis dated 04/16/2024 and MRI abdomen dated 01/10/2022. CLINICAL HISTORY: biliary ductal dilation on CT FINDINGS: LIVER: Normal size liver. Normal liver configuration. No liver masses. No significant hepatic steatosis. GALLBLADDER AND BILIARY SYSTEM: Cholecystectomy. Mild diffuse intrahepatic biliary ductal dilatation is not substantially changed from 01/10/2022 MRI. Common bile duct diameter 10 mm, unchanged from prior MRI. No choledocholithiasis. No biliary masses or strictures on MRCP sequences. SPLEEN: Unremarkable. PANCREAS/PANCREATIC DUCT: Visualized pancreas is unremarkable. No pancreatic ductal dilatation. ADRENAL GLANDS: Unremarkable. KIDNEYS: Unremarkable. LYMPH NODES: No enlarged abdominal lymph nodes. VASCULATURE: Atherosclerotic nonaneurysmal abdominal aorta. PERITONEUM: No ascites. ABDOMINAL WALL: No hernia. No mass. BOWEL: Small to moderate hiatal hernia. Otherwise normal nondistended stomach. Grossly unremarkable visualized bowel. No bowel obstruction. BONES: No acute abnormality or worrisome osseous lesion. SOFT TISSUES: Unremarkable. MISCELLANEOUS: Unremarkable. IMPRESSION: 1. Cholecystectomy. 2. Chronic mild diffuse intrahepatic and extrahepatic biliary ductal dilatation, unchanged from 2023 MRI, most compatible with chronic postcholecystectomy effect. No choledocholithiasis or biliary mass/stricture on MRCP sequences. 3. Aortic Atherosclerosis (ICD10-I70.0). Electronically signed by: Selinda Blue MD 04/17/2024 09:05 AM EDT RP Workstation:  [id number]   CT ABDOMEN PELVIS W CONTRAST Result Date: 04/16/2024 CLINICAL DATA:  Epigastric abdominal pain with nausea and vomiting for the past week. EXAM: CT ABDOMEN AND PELVIS WITH CONTRAST TECHNIQUE: Multidetector CT imaging of the abdomen and pelvis was performed using the standard protocol following bolus administration of intravenous contrast. RADIATION DOSE REDUCTION: This exam was performed according to the departmental dose-optimization program which includes automated exposure control, adjustment of the mA and/or kV according to patient size and/or use of iterative reconstruction technique. CONTRAST:  OMNIPAQUE  IOHEXOL  300 MG/ML  SOLN COMPARISON:  09/24/2023 and 05/12/2020 FINDINGS: Lower chest: Moderate-sized hiatal hernia with a paraesophageal component and an interval significant increase in size. Normal sized heart. 3 mm left lower lobe nodule on image number 2/4, unchanged since 05/12/2020, not requiring imaging follow-up. Similar right lower lobe nodule on image number 1/4, also unchanged and not needing imaging follow-up. Minimal bilateral dependent atelectasis. Hepatobiliary: Cholecystectomy clips are again demonstrated with interval mild-to-moderate intrahepatic and extrahepatic biliary ductal dilatation with no obstructing stone or mass seen. The common duct currently measures 12 mm in maximum diameter on image number 22/4, previously 7 mm on 09/24/2023. Pancreas: Stable moderate atrophy of the pancreas with minimally progressive pancreatic ductal dilatation. Spleen: Normal in size without focal abnormality. Adrenals/Urinary Tract: Adrenal glands are unremarkable. Kidneys are normal, without renal calculi, focal lesion, or hydronephrosis. Bladder is unremarkable. Stomach/Bowel: Moderate-sized hiatal hernia with a paraesophageal component and an interval significant increase in size. Surgically absent appendix. Unremarkable colon and small bowel. Vascular/Lymphatic: Atheromatous  arterial calcifications without aneurysm. No enlarged lymph nodes. Reproductive: Status post hysterectomy. No adnexal masses. Other: No abdominal wall hernia or abnormality. No abdominopelvic ascites. Musculoskeletal: Lumbar and lower thoracic spine degenerative changes. IMPRESSION: 1. Interval mild-to-moderate intrahepatic and extrahepatic biliary ductal dilatation with no obstructing stone or mass seen. This could be due to a stricture, occult mass or noncalcified stone. This could be further evaluated with MRCP. 2. Moderate-sized hiatal hernia with a paraesophageal component and an interval significant increase in  size. 3. Stable moderate atrophy of the pancreas with minimally progressive pancreatic ductal dilatation. Electronically Signed   By: Elspeth Bathe M.D.   On: 04/16/2024 14:20   DG Chest Portable 1 View Result Date: 04/16/2024 CLINICAL DATA:  Chest pain and nausea for the past 2 weeks. Diarrhea. EXAM: PORTABLE CHEST 1 VIEW COMPARISON:  03/18/2023 FINDINGS: Normal sized heart. Stable post CABG changes. Clear lungs with normal vascularity. Small to moderate-sized hiatal hernia. Diffuse osteopenia. IMPRESSION: 1. No acute abnormality. 2. Small to moderate-sized hiatal hernia. Electronically Signed   By: Elspeth Bathe M.D.   On: 04/16/2024 12:35    Assessment  78 y.o. female with a history of Cdiff. CAD s/p CABG, HTN, GERD, chronic dysphagia initially thought to be 2/2 type III achalasia but repeat manometry negative, diabetes, depression, and chronic constipation who presented to the ED from GI office at [address] secondary to dry heaving, weakness, and concern for dehydration. GI consulted to assist in evaluation and management.   Nausea and vomiting, biliary dilation on imaging: CT at outside hospital recently 10/27 with evidence of moderate stool burden, large hiatal hernia, prominent CBD. Seen in the clinic yesterday to discuss nausea, vomiting, and constipation. Previously reported that  bowels moving well with Linzess  however after starting treated for possible sinus infection she began vomiting and subsequently unable to continue to take any medications. Prior to being seen in the clinic she was recommended to increase miralax  to BID but had not done this. She was also endorsing dizziness and given dry heaving and unsteady gait she was brought over from the office for ED evaluation. Repeat CT yesterday here at [facility] with interval mild to moderate intra and extr hepatic biliary dilation without obstruction or mass, moderate sized hiatal hernia with a paraesophageal component and interval increase in size and stable moderate pancreatic atrophy and minimally progressive pancreatic ductal dilation. Dr Cinderella recommended MRCP which was completed this morning with chronic diffuse biliary ductal dilation uncharged since 2023 consistent with cholecystectomy effect and no significant pancreatic ductal dilation or concerning pancreatic findings. LFTs have been within normal limits.  Given elevated blood sugars it is suspected that she is in normoglycemic DKA state and was started on insulin  drip and she has received fluid boluses.  Suspect that give URI symptoms and outpatient treatment with steroids that DKA was likely the cause of her nausea and vomiting. Now improved with hydration and good glucose control.   Constipation Recent imaging with moderate colonic stool burden. While she was vomiting recommedned she take miralax  twice daily. If taking p.o. then we can place back on her home linzess  and supplement with miralax  as needed.  Plan / Recommendations  Advance diet as tolerated Supportive measures - antiemetics as needed Resume home Linzess  Miralax  17g 1-2 times daily as needed if linzess  not effective on discharge.  Follow up in the office in 6-8 weeks.    Given patient has improved and overall unremarkable MRCP with normal LFTs, GI will sign off and follow-up outpatient.   LOS: 1 day     04/17/2024, 9:25 AM   Charmaine Melia, MSN, FNP-BC, AGACNP-BC [facility]

## 2024-04-17 NOTE — Progress Notes (Signed)
   04/17/24 1442  TOC Brief Assessment  Insurance and Status Reviewed  Patient has primary care physician Yes  Home environment has been reviewed Home  Prior level of function: Independent  Prior/Current Home Services No current home services  Social Drivers of Health Review SDOH reviewed no interventions necessary  Readmission risk has been reviewed Yes  Transition of care needs no transition of care needs at this time   Inpatient Care Manager (ICM) has reviewed patient and no ICM needs have been identified at this time. We will continue to monitor patient advancement through interdisciplinary progression rounds. If new patient transition needs arise, please place a ICM consult.

## 2024-04-17 NOTE — Telephone Encounter (Signed)
 Please schedule hospital follow-up for the patient in 6-8 weeks with [facility] or Dr. Eartha.

## 2024-04-17 NOTE — Inpatient Diabetes Management (Signed)
 Inpatient Diabetes Program Recommendations  AACE/ADA: New Consensus Statement on Inpatient Glycemic Control (2015)  Target Ranges:  Prepandial:   less than 140 mg/dL      Peak postprandial:   less than 180 mg/dL (1-2 hours)      Critically ill patients:  140 - 180 mg/dL   Lab Results  Component Value Date   GLUCAP 78 04/17/2024   HGBA1C 7.5 (A) 02/26/2024    Review of Glycemic Control  Latest Reference Range & Units 04/16/24 15:14 04/16/24 16:26 04/16/24 17:41 04/16/24 19:44 04/16/24 21:32 04/17/24 07:29  Glucose-Capillary 70 - 99 mg/dL 88 872 (H) 889 (H) 82 78 78   Diabetes history: DM  Outpatient Diabetes medications:  Dexcom G7 Jardiance  25 mg daily Metformin  1000 mg q AM Current orders for Inpatient glycemic control:  Novolog  0-9 units tid with meals and HS  Inpatient Diabetes Program Recommendations:    Per MD nausea and vomiting, Likely precipitated by DKA in the setting of Jardiance  usage.  Agree with current orders.  Will follow.   Thanks,  Randall Bullocks, RN, BC-ADM Inpatient Diabetes Coordinator Pager [phone]  (8a-5p)

## 2024-04-17 NOTE — Hospital Course (Signed)
 78 year old female with a history of diabetes mellitus type 2, hypertension, coronary disease status post CABG presenting with at least 1 week history of nausea, vomiting, and upper abdominal pain. Patient states that she was placed on azithromycin and prednisone  about 10 days prior to admission.  She took 3 days worth after which she began having nausea and vomiting.  She stated that her sugars were up to near 500.  After she stopped taking the prednisone , her sugars did improve, but she continued to have nausea and vomiting.  She denies any diarrhea, hematochezia, melena, hematemesis.  She has subjective chills. She developed some substernal chest pain with the vomiting, but denied any worsening shortness of breath.  She does not smoke.  She denies any dysuria or hematuria. Because of continued vomiting she presented for further evaluation and treatment. In the ED, the patient was afebrile hemodynamically stable with oxygen  saturation 96% room air.  WBC 10.6, hemoglobin 16.1, platelets 232.  Sodium 139, potassium 3.8, bicarbonate 22, serum creatinine 0.50.  AST 42, ALT 30, alk phosphatase 90, total bilirubin 0.4.  BHA was 4.0.  Patient was initially started on IV fluids and insulin  drip.  This was discontinued after her metabolic acidosis improved. Troponin < 15 x 2.  CT abdomen and pelvis showed interval mild to moderate intrahepatic and extrahepatic biliary ductal dilatation without obstructing stone or mass.  There is a moderate size hiatal hernia.  There is stable moderate pancreatic atrophy.  GI was consulted.

## 2024-04-17 NOTE — Progress Notes (Addendum)
 PROGRESS NOTE  Katrina Castro FMW:[id number] DOB: 09-Dec-1945 DOA: 04/16/2024 PCP: Rosamond Leta NOVAK, MD  Brief History57  78 year old female with a history of diabetes mellitus type 2, hypertension, coronary disease status post CABG presenting with at least 1 week history of nausea, vomiting, and upper abdominal pain. Patient states that she was placed on azithromycin and prednisone  about 10 days prior to admission.  She took 3 days worth after which she began having nausea and vomiting.  She stated that her sugars were up to near 500.  After she stopped taking the prednisone , her sugars did improve, but she continued to have nausea and vomiting.  She denies any diarrhea, hematochezia, melena, hematemesis.  She has subjective chills. She developed some substernal chest pain with the vomiting, but denied any worsening shortness of breath.  She does not smoke.  She denies any dysuria or hematuria. Because of continued vomiting she presented for further evaluation and treatment. In the ED, the patient was afebrile hemodynamically stable with oxygen  saturation 96% room air.  WBC 10.6, hemoglobin 16.1, platelets 232.  Sodium 139, potassium 3.8, bicarbonate 22, serum creatinine 0.50.  AST 42, ALT 30, alk phosphatase 90, total bilirubin 0.4.  BHA was 4.0.  Patient was initially started on IV fluids and insulin  drip.  This was discontinued after her metabolic acidosis improved. Troponin < 15 x 2.  CT abdomen and pelvis showed interval mild to moderate intrahepatic and extrahepatic biliary ductal dilatation without obstructing stone or mass.  There is a moderate size hiatal hernia.  There is stable moderate pancreatic atrophy.  GI was consulted.   Assessment/Plan: Intractable nausea and vomiting - Likely precipitated by DKA in the setting of Jardiance  usage -10/30 CT AP as discussed above - MRCP ordered - GI consult - Start pantoprazole  twice daily - Continue antiemetics - Continue IV fluids -  Holding Jardiance  - lipase 14 - clear liquids for now  Diabetes mellitus type 2 - Holding Jardiance , metformin , rapaglinide  - 02/26/24 A1C--7.5 - novolog  sliding scale  Biliary ductal dilatation -CT AP WC-mild to moderate intra and extrahepatic biliary ductal dilatation, no stone or mass seen, possible stricture, occult mass or noncalcified stone.  - MRCP recommended and pending - EDP spoke to Dr. Cinderella, will see in consult in a.m. - She has status post cholecystectomy over 20 years ago  Hiatal hernia - Noted on CT - This may be contributing to her continued dyspepsia and vomiting  Coronary disease/chest pain - Chest pain is atypical - Troponins negative x 2 - Personally reviewed EKG--sinus rhythm, no ST ST wave change  Essential hypertension - Resume Imdur  and metoprolol   Mixed hyperlipidemia - Continue statin        Family Communication:   no Family at bedside  Consultants:  GI  Code Status:  FULL  DVT Prophylaxis:  [place] Lovenox    Procedures: As Listed in Progress Note Above  Antibiotics: None       Subjective: Patient complains of nausea.  She had GI hives overnight.  She denies any diarrhea.  She has upper abdominal pain.  Denies any shortness of breath but has substernal chest pain.  There is no coughing or hemoptysis.  Objective: Vitals:   04/17/24 0400 04/17/24 0440 04/17/24 0500 04/17/24 0732  BP: 126/64  (!) 134/113   Pulse: 72  71   Resp: 16  14   Temp:  98 F (36.7 C)  98.1 F (36.7 C)  TempSrc:  Oral  Oral  SpO2: 95%  95%   Weight:      Height:        Intake/Output Summary (Last 24 hours) at 04/17/2024 0733 Last data filed at 04/17/2024 0551 Gross per 24 hour  Intake 3905.6 ml  Output 450 ml  Net 3455.6 ml   Weight change:  Exam:  General:  Pt is alert, follows commands appropriately, not in acute distress HEENT: No icterus, No thrush, No neck mass, [place]/AT Cardiovascular: RRR, S1/S2, no rubs, no gallops Respiratory: CTA  bilaterally, no wheezing, no crackles, no rhonchi Abdomen: Soft/+BS, epigastric tender, non distended, no guarding Extremities: No edema, No lymphangitis, No petechiae, No rashes, no synovitis   Data Reviewed: I have personally reviewed following labs and imaging studies Basic Metabolic Panel: Recent Labs  Lab 04/16/24 1125 04/16/24 1710 04/16/24 2115 04/17/24 0458  NA 142 139 138 139  K 3.6 3.6 3.5 3.8  CL 102 103 103 103  CO2 19* 22 21* 22  GLUCOSE 101* 117* 79 70  BUN 14 9 7* 5*  CREATININE 0.55 0.50 0.48 0.50  CALCIUM  10.3 8.7* 8.9 9.0   Liver Function Tests: Recent Labs  Lab 04/16/24 1125  AST 42*  ALT 30  ALKPHOS 90  BILITOT 0.4  PROT 7.7  ALBUMIN  4.7   Recent Labs  Lab 04/16/24 1125  LIPASE 14   No results for input(s): AMMONIA in the last 168 hours. Coagulation Profile: No results for input(s): INR, PROTIME in the last 168 hours. CBC: Recent Labs  Lab 04/16/24 1125  WBC 10.6*  NEUTROABS 7.9*  HGB 16.1*  HCT 47.0*  MCV 85.9  PLT 232   Cardiac Enzymes: No results for input(s): CKTOTAL, CKMB, CKMBINDEX, TROPONINI in the last 168 hours. BNP: Invalid input(s): POCBNP CBG: Recent Labs  Lab 04/16/24 1626 04/16/24 1741 04/16/24 1944 04/16/24 2132 04/17/24 0729  GLUCAP 127* 110* 82 78 78   HbA1C: No results for input(s): HGBA1C in the last 72 hours. Urine analysis:    Component Value Date/Time   COLORURINE YELLOW 04/16/2024 1305   APPEARANCEUR CLEAR 04/16/2024 1305   LABSPEC 1.036 (H) 04/16/2024 1305   PHURINE 5.0 04/16/2024 1305   GLUCOSEU >=500 (A) 04/16/2024 1305   HGBUR SMALL (A) 04/16/2024 1305   BILIRUBINUR NEGATIVE 04/16/2024 1305   KETONESUR 80 (A) 04/16/2024 1305   PROTEINUR NEGATIVE 04/16/2024 1305   NITRITE NEGATIVE 04/16/2024 1305   LEUKOCYTESUR NEGATIVE 04/16/2024 1305   Sepsis Labs: @LABRCNTIP (procalcitonin:4,lacticidven:4) ) Recent Results (from the past 240 hours)  MRSA Next Gen by PCR, Nasal      Status: None   Collection Time: 04/16/24  5:50 PM   Specimen: Nasal Mucosa; Nasal Swab  Result Value Ref Range Status   MRSA by PCR Next Gen NOT DETECTED NOT DETECTED Final    Comment: (NOTE) The GeneXpert MRSA Assay (FDA approved for NASAL specimens only), is one component of a comprehensive MRSA colonization surveillance program. It is not intended to diagnose MRSA infection nor to guide or monitor treatment for MRSA infections. Test performance is not FDA approved in patients less than 64 years old. Performed at [facility], [address] [place], [place] [address]      Scheduled Meds:  acyclovir   400 mg Oral Daily   aspirin  EC  81 mg Oral Daily   Chlorhexidine  Gluconate Cloth  6 each Topical Q0600   enoxaparin  (LOVENOX ) injection  40 mg Subcutaneous Q24H   insulin  aspart  0-5 Units Subcutaneous QHS   insulin  aspart  0-9 Units Subcutaneous TID WC   isosorbide  mononitrate  30 mg Oral Daily   metoprolol  tartrate  12.5 mg Oral BID   rosuvastatin   40 mg Oral Daily   Continuous Infusions:  lactated ringers  100 mL/hr at 04/17/24 0533    Procedures/Studies: CT ABDOMEN PELVIS W CONTRAST Result Date: 04/16/2024 CLINICAL DATA:  Epigastric abdominal pain with nausea and vomiting for the past week. EXAM: CT ABDOMEN AND PELVIS WITH CONTRAST TECHNIQUE: Multidetector CT imaging of the abdomen and pelvis was performed using the standard protocol following bolus administration of intravenous contrast. RADIATION DOSE REDUCTION: This exam was performed according to the departmental dose-optimization program which includes automated exposure control, adjustment of the mA and/or kV according to patient size and/or use of iterative reconstruction technique. CONTRAST:  OMNIPAQUE  IOHEXOL  300 MG/ML  SOLN COMPARISON:  09/24/2023 and 05/12/2020 FINDINGS: Lower chest: Moderate-sized hiatal hernia with a paraesophageal component and an interval significant increase in size. Normal sized heart. 3 mm  left lower lobe nodule on image number 2/4, unchanged since 05/12/2020, not requiring imaging follow-up. Similar right lower lobe nodule on image number 1/4, also unchanged and not needing imaging follow-up. Minimal bilateral dependent atelectasis. Hepatobiliary: Cholecystectomy clips are again demonstrated with interval mild-to-moderate intrahepatic and extrahepatic biliary ductal dilatation with no obstructing stone or mass seen. The common duct currently measures 12 mm in maximum diameter on image number 22/4, previously 7 mm on 09/24/2023. Pancreas: Stable moderate atrophy of the pancreas with minimally progressive pancreatic ductal dilatation. Spleen: Normal in size without focal abnormality. Adrenals/Urinary Tract: Adrenal glands are unremarkable. Kidneys are normal, without renal calculi, focal lesion, or hydronephrosis. Bladder is unremarkable. Stomach/Bowel: Moderate-sized hiatal hernia with a paraesophageal component and an interval significant increase in size. Surgically absent appendix. Unremarkable colon and small bowel. Vascular/Lymphatic: Atheromatous arterial calcifications without aneurysm. No enlarged lymph nodes. Reproductive: Status post hysterectomy. No adnexal masses. Other: No abdominal wall hernia or abnormality. No abdominopelvic ascites. Musculoskeletal: Lumbar and lower thoracic spine degenerative changes. IMPRESSION: 1. Interval mild-to-moderate intrahepatic and extrahepatic biliary ductal dilatation with no obstructing stone or mass seen. This could be due to a stricture, occult mass or noncalcified stone. This could be further evaluated with MRCP. 2. Moderate-sized hiatal hernia with a paraesophageal component and an interval significant increase in size. 3. Stable moderate atrophy of the pancreas with minimally progressive pancreatic ductal dilatation. Electronically Signed   By: Elspeth Bathe M.D.   On: 04/16/2024 14:20   DG Chest Portable 1 View Result Date: 04/16/2024 CLINICAL  DATA:  Chest pain and nausea for the past 2 weeks. Diarrhea. EXAM: PORTABLE CHEST 1 VIEW COMPARISON:  03/18/2023 FINDINGS: Normal sized heart. Stable post CABG changes. Clear lungs with normal vascularity. Small to moderate-sized hiatal hernia. Diffuse osteopenia. IMPRESSION: 1. No acute abnormality. 2. Small to moderate-sized hiatal hernia. Electronically Signed   By: Elspeth Bathe M.D.   On: 04/16/2024 12:35    Alm Schneider, DO  [facility]  If 7PM-7AM, please contact night-coverage [email or web address] Password [email or web address] 04/17/2024, 7:33 AM   LOS: 1 day

## 2024-04-18 DIAGNOSIS — R1110 Vomiting, unspecified: Secondary | ICD-10-CM | POA: Diagnosis not present

## 2024-04-18 DIAGNOSIS — R112 Nausea with vomiting, unspecified: Secondary | ICD-10-CM | POA: Diagnosis not present

## 2024-04-18 DIAGNOSIS — E860 Dehydration: Secondary | ICD-10-CM

## 2024-04-18 DIAGNOSIS — K5904 Chronic idiopathic constipation: Secondary | ICD-10-CM

## 2024-04-18 DIAGNOSIS — E1110 Type 2 diabetes mellitus with ketoacidosis without coma: Secondary | ICD-10-CM | POA: Diagnosis not present

## 2024-04-18 LAB — COMPREHENSIVE METABOLIC PANEL WITH GFR
ALT: 17 U/L (ref 0–44)
AST: 23 U/L (ref 15–41)
Albumin: 3.8 g/dL (ref 3.5–5.0)
Alkaline Phosphatase: 69 U/L (ref 38–126)
Anion gap: 14 (ref 5–15)
BUN: 5 mg/dL — ABNORMAL LOW (ref 8–23)
CO2: 23 mmol/L (ref 22–32)
Calcium: 9 mg/dL (ref 8.9–10.3)
Chloride: 103 mmol/L (ref 98–111)
Creatinine, Ser: 0.52 mg/dL (ref 0.44–1.00)
GFR, Estimated: >60 mL/min (ref >60)
Glucose, Bld: 82 mg/dL (ref 70–99)
Potassium: 3.4 mmol/L — ABNORMAL LOW (ref 3.5–5.1)
Sodium: 141 mmol/L (ref 135–145)
Total Bilirubin: 0.3 mg/dL (ref 0.0–1.2)
Total Protein: 5.8 g/dL — ABNORMAL LOW (ref 6.5–8.1)

## 2024-04-18 LAB — CBC
HCT: 37.2 % (ref 36.0–46.0)
Hemoglobin: 12.7 g/dL (ref 12.0–15.0)
MCH: 29.1 pg (ref 26.0–34.0)
MCHC: 34.1 g/dL (ref 30.0–36.0)
MCV: 85.1 fL (ref 80.0–100.0)
Platelets: 234 K/uL (ref 150–400)
RBC: 4.37 MIL/uL (ref 3.87–5.11)
RDW: 12.8 % (ref 11.5–15.5)
WBC: 6.6 K/uL (ref 4.0–10.5)
nRBC: 0 % (ref 0.0–0.2)

## 2024-04-18 LAB — GLUCOSE, CAPILLARY
Glucose-Capillary: 88 mg/dL (ref 70–99)
Glucose-Capillary: 83 mg/dL (ref 70–99)

## 2024-04-18 LAB — MAGNESIUM: Magnesium: 2.2 mg/dL (ref 1.7–2.4)

## 2024-04-18 MED ORDER — SENNA 8.6 MG PO TABS
2.0000 | ORAL_TABLET | Freq: Every day | ORAL | Status: DC
Start: 2024-04-18 — End: 2024-04-18
  Administered 2024-04-18: 17.2 mg via ORAL
  Filled 2024-04-18: qty 2

## 2024-04-18 MED ORDER — DOCUSATE SODIUM 100 MG PO CAPS
100.0000 mg | ORAL_CAPSULE | Freq: Two times a day (BID) | ORAL | Status: DC
  Administered 2024-04-18: 100 mg via ORAL
  Filled 2024-04-18: qty 1

## 2024-04-18 MED ORDER — POTASSIUM CHLORIDE 20 MEQ PO PACK
20.0000 meq | PACK | Freq: Once | ORAL | Status: AC
  Administered 2024-04-18: 20 meq via ORAL
  Filled 2024-04-18: qty 1

## 2024-04-18 MED ORDER — KETOROLAC TROMETHAMINE 15 MG/ML IJ SOLN
15.0000 mg | Freq: Once | INTRAMUSCULAR | Status: AC
  Administered 2024-04-18: 15 mg via INTRAVENOUS
  Filled 2024-04-18: qty 1

## 2024-04-18 MED ORDER — LACTATED RINGERS IV BOLUS
1000.0000 mL | Freq: Once | INTRAVENOUS | Status: AC
  Administered 2024-04-18: 1000 mL via INTRAVENOUS

## 2024-04-18 NOTE — Progress Notes (Signed)
 Gastroenterology & Hepatology   Interval History: Patient reports lower abdominal discomfort.  Having had a bowel movement 3 days.  Is passing gas.  Patient wants to go home  Inpatient Medications:  Current Facility-Administered Medications:    acyclovir  (ZOVIRAX ) tablet 400 mg, 400 mg, Oral, Daily, Emokpae, Ejiroghene E, MD, 400 mg at 04/18/24 0818   aspirin  EC tablet 81 mg, 81 mg, Oral, Daily, Emokpae, Ejiroghene E, MD, 81 mg at 04/18/24 0818   Chlorhexidine  Gluconate Cloth 2 % PADS 6 each, 6 each, Topical, Q0600, Emokpae, Ejiroghene E, MD, 6 each at 04/18/24 0533   dextrose  50 % solution 0-50 mL, 0-50 mL, Intravenous, PRN, Emokpae, Ejiroghene E, MD   docusate sodium  (COLACE) capsule 100 mg, 100 mg, Oral, BID, Tat, David, MD   enoxaparin  (LOVENOX ) injection 40 mg, 40 mg, Subcutaneous, Q24H, Emokpae, Ejiroghene E, MD, 40 mg at 04/17/24 2133   insulin  aspart (novoLOG ) injection 0-5 Units, 0-5 Units, Subcutaneous, QHS, Emokpae, Ejiroghene E, MD   insulin  aspart (novoLOG ) injection 0-9 Units, 0-9 Units, Subcutaneous, TID WC, Emokpae, Ejiroghene E, MD   isosorbide  mononitrate (IMDUR ) 24 hr tablet 30 mg, 30 mg, Oral, Daily, Emokpae, Ejiroghene E, MD, 30 mg at 04/18/24 0818   metoprolol  tartrate (LOPRESSOR ) tablet 12.5 mg, 12.5 mg, Oral, BID, Emokpae, Ejiroghene E, MD, 12.5 mg at 04/18/24 0818   ondansetron  (ZOFRAN ) injection 4 mg, 4 mg, Intravenous, Q6H PRN, Tat, David, MD   pantoprazole  (PROTONIX ) injection 40 mg, 40 mg, Intravenous, Q12H, Tat, Alm, MD, 40 mg at 04/18/24 0819   rosuvastatin  (CRESTOR ) tablet 40 mg, 40 mg, Oral, Daily, Emokpae, Ejiroghene E, MD, 40 mg at 04/18/24 0818   senna (SENOKOT) tablet 17.2 mg, 2 tablet, Oral, Daily, Tat, David, MD   I/O    Intake/Output Summary (Last 24 hours) at 04/18/2024 1316 Last data filed at 04/18/2024 0414 Gross per 24 hour  Intake 1673.21 ml  Output --  Net 1673.21 ml     Physical Exam: Temp:  [97.8 F (36.6 C)-98.7 F (37.1 C)] 97.9 F  (36.6 C) (11/01 1131) Resp:  [10-24] 10 (11/01 0900) BP: (112-152)/(42-74) 152/74 (11/01 0900)  Temp (24hrs), Avg:98.1 F (36.7 C), Min:97.8 F (36.6 C), Max:98.7 F (37.1 C)  GENERAL: The patient is AO x3, in no acute distress. HEENT: Head is normocephalic and atraumatic. EOMI are intact. Mouth is well hydrated and without lesions. NECK: Supple. No masses LUNGS: Clear to auscultation. No presence of rhonchi/wheezing/rales. Adequate chest expansion HEART: RRR, normal s1 and s2. ABDOMEN: Soft, lower abdomen tenderness, no guarding, no peritoneal signs, and nondistended. BS +. No masses.   Laboratory Data: CBC:     Component Value Date/Time   WBC 6.6 04/18/2024 0358   RBC 4.37 04/18/2024 0358   HGB 12.7 04/18/2024 0358   HGB 12.3 03/31/2020 1208   HCT 37.2 04/18/2024 0358   HCT 38.2 03/31/2020 1208   PLT 234 04/18/2024 0358   PLT 190 03/31/2020 1208   MCV 85.1 04/18/2024 0358   MCV 80 03/31/2020 1208   MCH 29.1 04/18/2024 0358   MCHC 34.1 04/18/2024 0358   RDW 12.8 04/18/2024 0358   RDW 13.1 03/31/2020 1208   LYMPHSABS 2.0 04/16/2024 1125   MONOABS 0.6 04/16/2024 1125   EOSABS 0.0 04/16/2024 1125   BASOSABS 0.0 04/16/2024 1125   COAG:  Lab Results  Component Value Date   INR 1.4 (H) 04/27/2020   INR 1.2 04/26/2020   INR 1.1 04/25/2020    BMP:     Latest Ref  Rng & Units 04/18/2024    3:58 AM 04/17/2024    4:58 AM 04/16/2024    9:15 PM  BMP  Glucose 70 - 99 mg/dL 82  70  79   BUN 8 - 23 mg/dL 5  5  7    Creatinine 0.44 - 1.00 mg/dL 9.47  9.49  9.51   Sodium 135 - 145 mmol/L 141  139  138   Potassium 3.5 - 5.1 mmol/L 3.4  3.8  3.5   Chloride 98 - 111 mmol/L 103  103  103   CO2 22 - 32 mmol/L 23  22  21    Calcium  8.9 - 10.3 mg/dL 9.0  9.0  8.9     HEPATIC:     Latest Ref Rng & Units 04/18/2024    3:58 AM 04/16/2024   11:25 AM 09/24/2023    2:37 PM  Hepatic Function  Total Protein 6.5 - 8.1 g/dL 5.8  7.7  7.5   Albumin  3.5 - 5.0 g/dL 3.8  4.7    AST 15 - 41  U/L 23  42  17   ALT 0 - 44 U/L 17  30  10    Alk Phosphatase 38 - 126 U/L 69  90    Total Bilirubin 0.0 - 1.2 mg/dL 0.3  0.4  0.3     CARDIAC:  Lab Results  Component Value Date   TROPONINI <0.03 05/19/2016      Imaging: I personally reviewed and interpreted the available labs, imaging and endoscopic files.    Labs within AST 42 ALT 30 alk phos 90 T. bili 0.4   MRI/MRCP 1. Cholecystectomy. 2. Chronic mild diffuse intrahepatic and extrahepatic biliary ductal dilatation, unchanged from 2023 MRI, most compatible with chronic postcholecystectomy effect. No choledocholithiasis or biliary mass/stricture on MRCP sequences. 3. Aortic Atherosclerosis     Assessment/Plan:  This is a 78 year old female with history of chronic dysphagia, constipation, coronary artery disease and C. difficile was sent from the GI clinic with lethargy and nausea with vomiting. Patient was admitted with hyperglycemia requiring insulin  drip. GI was consulted for CT finding of biliary duct dilation setting of nausea and vomiting   #Constipation #Lower abdomen pain  #Biliary duct dilation  This is an elderly female had nausea and vomiting found to have hyperglycemia in setting of upper respiratory infection. Acute GI pathology was ruled out. MRCP without any choledocholithiasis.   Advance diet as tolerated MiraLAX  twice daily Restart Linzess  Follow-up in the GI clinic   Eston Heslin Faizan Keyuana Wank, MD Gastroenterology and Hepatology [facility]   This chart has been completed using [facility] Dictation software, and while attempts have been made to ensure accuracy , certain words and phrases may not be transcribed as intended

## 2024-04-18 NOTE — Discharge Summary (Signed)
 Physician Discharge Summary   Patient: Katrina Castro MRN: [id number] DOB: Dec 17, 1945  Admit date:     04/16/2024  Discharge date: 04/18/24  Discharge Physician: Alm Isair Inabinet   PCP: Rosamond Leta NOVAK, MD   Recommendations at discharge:   Please follow up with primary care provider within 1-2 weeks  Please repeat BMP  in one week    Hospital Course: 78 year old female with a history of diabetes mellitus type 2, hypertension, coronary disease status post CABG presenting with at least 1 week history of nausea, vomiting, and upper abdominal pain. Patient states that she was placed on azithromycin and prednisone  about 10 days prior to admission.  She took 3 days worth after which she began having nausea and vomiting.  She stated that her sugars were up to near 500.  After she stopped taking the prednisone , her sugars did improve, but she continued to have nausea and vomiting.  She denies any diarrhea, hematochezia, melena, hematemesis.  She has subjective chills. She developed some substernal chest pain with the vomiting, but denied any worsening shortness of breath.  She does not smoke.  She denies any dysuria or hematuria. Because of continued vomiting she presented for further evaluation and treatment. In the ED, the patient was afebrile hemodynamically stable with oxygen  saturation 96% room air.  WBC 10.6, hemoglobin 16.1, platelets 232.  Sodium 139, potassium 3.8, bicarbonate 22, serum creatinine 0.50.  AST 42, ALT 30, alk phosphatase 90, total bilirubin 0.4.  BHA was 4.0.  Patient was initially started on IV fluids and insulin  drip.  This was discontinued after her metabolic acidosis improved. Troponin < 15 x 2.  CT abdomen and pelvis showed interval mild to moderate intrahepatic and extrahepatic biliary ductal dilatation without obstructing stone or mass.  There is a moderate size hiatal hernia.  There is stable moderate pancreatic atrophy.  GI was consulted.  Assessment and Plan: Intractable  nausea and vomiting - Likely precipitated by DKA in the setting of Jardiance  usage -10/30 CT AP as discussed above - MRCP ordered--no choledocholithiasis.  No biliary mass or biliary stricture - GI consult appreciated - Started pantoprazole  twice daily - Continued antiemetics - Continued IV fluids - Holding Jardiance  during hospitalization - lipase 14 - clear liquids initially>>advanced to carb modified which pt tolerated   Diabetes mellitus type 2 - Holding Jardiance , metformin , rapaglinide  - 02/26/24 A1C--7.5 - novolog  sliding scale - we discussed the risk of ketoacidosis with Jardiance .  As her clinical picture may have been clouded by her severe hyperglycemia, we agreed to restart it after d/c with close follow up with PCP   Biliary ductal dilatation -CT AP WC-mild to moderate intra and extrahepatic biliary ductal dilatation, no stone or mass seen, possible stricture, occult mass or noncalcified stone.  - MRCP--chronic mild diffuse intrahepatic and extrahepatic biliary ductal dilatation unchanged from 2023;  No choledocholithasis; no biliary mass, no stricture   Hiatal hernia - Noted on CT - This may be contributing to her continued dyspepsia and vomiting -continue pantoprazole    Coronary disease/chest pain - Chest pain is atypical - Troponins negative x 2 - Personally reviewed EKG--sinus rhythm, no ST ST wave change   Essential hypertension - Resume Imdur  and metoprolol    Mixed hyperlipidemia - Continue statin   Headache -give toradol  x 1 -give fluid bolus -Improved with the above measures   Hypokalemia -repleted     Consultants: GI Procedures performed: none  Disposition: Home Diet recommendation:  Carb modified diet DISCHARGE MEDICATION: Allergies as of  04/18/2024       Reactions   Bee Venom Anaphylaxis   Levofloxacin Anaphylaxis, Rash   Cardizem  [diltiazem ] Other (See Comments)   Patient states that she could not think, felt that she was in the  twilight zone.  Arm discomfort.   Diazepam Nausea Only   Lipitor [atorvastatin] Other (See Comments)   Caused her body to be out of whack  - elevated sugar also.   Sulfa Antibiotics Itching        Medication List     STOP taking these medications    azithromycin 250 MG tablet Commonly known as: ZITHROMAX   cefUROXime  500 MG tablet Commonly known as: CEFTIN    predniSONE  10 MG tablet Commonly known as: DELTASONE    promethazine  12.5 MG tablet Commonly known as: PHENERGAN        TAKE these medications    acyclovir  400 MG tablet Commonly known as: ZOVIRAX  Take 400 mg by mouth daily.   ALPRAZolam  0.5 MG tablet Commonly known as: XANAX  Take 0.5 mg by mouth at bedtime as needed for anxiety or sleep.   aspirin  EC 81 MG tablet Take 1 tablet (81 mg total) by mouth daily. Swallow whole.   cyanocobalamin  1000 MCG/ML injection Commonly known as: VITAMIN B12 Inject 1,000 mcg into the muscle every 28 (twenty-eight) days.   Dexcom G7 Receiver Devi Use to monitor glucose continuously   Dexcom G7 Sensor Misc Use to check glucose continuously, change sensor every 10 days   ezetimibe  10 MG tablet Commonly known as: ZETIA  take 1 tablet once daily.   isosorbide  mononitrate 30 MG 24 hr tablet Commonly known as: IMDUR  take 1 tablet (30 milligram total) by mouth daily. What changed: See the new instructions.   Jardiance  25 MG Tabs tablet Generic drug: empagliflozin  take 1 tablet by mouth daily before breakfast.   levocetirizine 5 MG tablet Commonly known as: XYZAL  Take 5 mg by mouth every evening.   linaclotide  72 MCG capsule Commonly known as: Linzess  Take 1 capsule (72 mcg total) by mouth daily before breakfast.   meclizine 25 MG tablet Commonly known as: ANTIVERT Take 25 mg by mouth 2 (two) times daily as needed for dizziness.   metFORMIN  1000 MG tablet Commonly known as: GLUCOPHAGE  Take 1 tablet (1,000 mg total) by mouth daily with breakfast.   metoprolol   tartrate 25 MG tablet Commonly known as: LOPRESSOR  take (1/2) tablet by mouth 2 times a day. What changed: See the new instructions.   nitroGLYCERIN  0.4 MG SL tablet Commonly known as: NITROSTAT  Place 1 tablet (0.4 mg total) under the tongue every 5 (five) minutes as needed for chest pain.   omeprazole  40 MG capsule Commonly known as: PRILOSEC take 1 capsule (40 MILLIGRAM total) by mouth daily. What changed: See the new instructions.   ondansetron  4 MG disintegrating tablet Commonly known as: ZOFRAN -ODT Take 4 mg by mouth every 8 (eight) hours as needed for nausea.   oxybutynin  10 MG 24 hr tablet Commonly known as: DITROPAN -XL Take 10 mg by mouth every evening.   repaglinide  0.5 MG tablet Commonly known as: PRANDIN  Take 2 tablets (1 mg total) by mouth daily before breakfast. Take 2 tablet (0.5 mg total) by mouth daily before breakfast.   rosuvastatin  40 MG tablet Commonly known as: CRESTOR  take 1 tablet by mouth once daily.        Discharge Exam: Filed Weights   04/16/24 1033 04/16/24 1630  Weight: 57 kg 58.8 kg   HEENT:  [place]/AT, No thrush, no icterus CV:  RRR, no rub, no S3, no S4 Lung:  CTA, no wheeze, no rhonchi Abd:  soft/+BS, mild epigastric pain Ext:  No edema, no lymphangitis, no synovitis, no rash   Condition at discharge: stable  The results of significant diagnostics from this hospitalization (including imaging, microbiology, ancillary and laboratory) are listed below for reference.   Imaging Studies: MR ABDOMEN MRCP W WO CONTAST Result Date: 04/17/2024 EXAM: MRCP WITHOUT AND WITH IV CONTRAST 04/17/2024 08:49:42 AM TECHNIQUE: Multisequence, multiplanar magnetic resonance images of the abdomen without and with intravenous contrast. MRCP sequences were performed. COMPARISON: CT abdomen/pelvis dated 04/16/2024 and MRI abdomen dated 01/10/2022. CLINICAL HISTORY: biliary ductal dilation on CT FINDINGS: LIVER: Normal size liver. Normal liver configuration. No  liver masses. No significant hepatic steatosis. GALLBLADDER AND BILIARY SYSTEM: Cholecystectomy. Mild diffuse intrahepatic biliary ductal dilatation is not substantially changed from 01/10/2022 MRI. Common bile duct diameter 10 mm, unchanged from prior MRI. No choledocholithiasis. No biliary masses or strictures on MRCP sequences. SPLEEN: Unremarkable. PANCREAS/PANCREATIC DUCT: Visualized pancreas is unremarkable. No pancreatic ductal dilatation. ADRENAL GLANDS: Unremarkable. KIDNEYS: Unremarkable. LYMPH NODES: No enlarged abdominal lymph nodes. VASCULATURE: Atherosclerotic nonaneurysmal abdominal aorta. PERITONEUM: No ascites. ABDOMINAL WALL: No hernia. No mass. BOWEL: Small to moderate hiatal hernia. Otherwise normal nondistended stomach. Grossly unremarkable visualized bowel. No bowel obstruction. BONES: No acute abnormality or worrisome osseous lesion. SOFT TISSUES: Unremarkable. MISCELLANEOUS: Unremarkable. IMPRESSION: 1. Cholecystectomy. 2. Chronic mild diffuse intrahepatic and extrahepatic biliary ductal dilatation, unchanged from 2023 MRI, most compatible with chronic postcholecystectomy effect. No choledocholithiasis or biliary mass/stricture on MRCP sequences. 3. Aortic Atherosclerosis (ICD10-I70.0). Electronically signed by: Selinda Blue MD 04/17/2024 09:05 AM EDT RP Workstation: [id number]   MR 3D Recon At Scanner Result Date: 04/17/2024 EXAM: MRCP WITHOUT AND WITH IV CONTRAST 04/17/2024 08:49:42 AM TECHNIQUE: Multisequence, multiplanar magnetic resonance images of the abdomen without and with intravenous contrast. MRCP sequences were performed. COMPARISON: CT abdomen/pelvis dated 04/16/2024 and MRI abdomen dated 01/10/2022. CLINICAL HISTORY: biliary ductal dilation on CT FINDINGS: LIVER: Normal size liver. Normal liver configuration. No liver masses. No significant hepatic steatosis. GALLBLADDER AND BILIARY SYSTEM: Cholecystectomy. Mild diffuse intrahepatic biliary ductal dilatation is not  substantially changed from 01/10/2022 MRI. Common bile duct diameter 10 mm, unchanged from prior MRI. No choledocholithiasis. No biliary masses or strictures on MRCP sequences. SPLEEN: Unremarkable. PANCREAS/PANCREATIC DUCT: Visualized pancreas is unremarkable. No pancreatic ductal dilatation. ADRENAL GLANDS: Unremarkable. KIDNEYS: Unremarkable. LYMPH NODES: No enlarged abdominal lymph nodes. VASCULATURE: Atherosclerotic nonaneurysmal abdominal aorta. PERITONEUM: No ascites. ABDOMINAL WALL: No hernia. No mass. BOWEL: Small to moderate hiatal hernia. Otherwise normal nondistended stomach. Grossly unremarkable visualized bowel. No bowel obstruction. BONES: No acute abnormality or worrisome osseous lesion. SOFT TISSUES: Unremarkable. MISCELLANEOUS: Unremarkable. IMPRESSION: 1. Cholecystectomy. 2. Chronic mild diffuse intrahepatic and extrahepatic biliary ductal dilatation, unchanged from 2023 MRI, most compatible with chronic postcholecystectomy effect. No choledocholithiasis or biliary mass/stricture on MRCP sequences. 3. Aortic Atherosclerosis (ICD10-I70.0). Electronically signed by: Selinda Blue MD 04/17/2024 09:05 AM EDT RP Workstation: [id number]   CT ABDOMEN PELVIS W CONTRAST Result Date: 04/16/2024 CLINICAL DATA:  Epigastric abdominal pain with nausea and vomiting for the past week. EXAM: CT ABDOMEN AND PELVIS WITH CONTRAST TECHNIQUE: Multidetector CT imaging of the abdomen and pelvis was performed using the standard protocol following bolus administration of intravenous contrast. RADIATION DOSE REDUCTION: This exam was performed according to the departmental dose-optimization program which includes automated exposure control, adjustment of the mA and/or kV according to patient size and/or use of iterative reconstruction technique. CONTRAST:  100mL OMNIPAQUE  IOHEXOL   300 MG/ML  SOLN COMPARISON:  09/24/2023 and 05/12/2020 FINDINGS: Lower chest: Moderate-sized hiatal hernia with a paraesophageal component and an  interval significant increase in size. Normal sized heart. 3 mm left lower lobe nodule on image number 2/4, unchanged since 05/12/2020, not requiring imaging follow-up. Similar right lower lobe nodule on image number 1/4, also unchanged and not needing imaging follow-up. Minimal bilateral dependent atelectasis. Hepatobiliary: Cholecystectomy clips are again demonstrated with interval mild-to-moderate intrahepatic and extrahepatic biliary ductal dilatation with no obstructing stone or mass seen. The common duct currently measures 12 mm in maximum diameter on image number 22/4, previously 7 mm on 09/24/2023. Pancreas: Stable moderate atrophy of the pancreas with minimally progressive pancreatic ductal dilatation. Spleen: Normal in size without focal abnormality. Adrenals/Urinary Tract: Adrenal glands are unremarkable. Kidneys are normal, without renal calculi, focal lesion, or hydronephrosis. Bladder is unremarkable. Stomach/Bowel: Moderate-sized hiatal hernia with a paraesophageal component and an interval significant increase in size. Surgically absent appendix. Unremarkable colon and small bowel. Vascular/Lymphatic: Atheromatous arterial calcifications without aneurysm. No enlarged lymph nodes. Reproductive: Status post hysterectomy. No adnexal masses. Other: No abdominal wall hernia or abnormality. No abdominopelvic ascites. Musculoskeletal: Lumbar and lower thoracic spine degenerative changes. IMPRESSION: 1. Interval mild-to-moderate intrahepatic and extrahepatic biliary ductal dilatation with no obstructing stone or mass seen. This could be due to a stricture, occult mass or noncalcified stone. This could be further evaluated with MRCP. 2. Moderate-sized hiatal hernia with a paraesophageal component and an interval significant increase in size. 3. Stable moderate atrophy of the pancreas with minimally progressive pancreatic ductal dilatation. Electronically Signed   By: Elspeth Bathe M.D.   On: 04/16/2024 14:20    DG Chest Portable 1 View Result Date: 04/16/2024 CLINICAL DATA:  Chest pain and nausea for the past 2 weeks. Diarrhea. EXAM: PORTABLE CHEST 1 VIEW COMPARISON:  03/18/2023 FINDINGS: Normal sized heart. Stable post CABG changes. Clear lungs with normal vascularity. Small to moderate-sized hiatal hernia. Diffuse osteopenia. IMPRESSION: 1. No acute abnormality. 2. Small to moderate-sized hiatal hernia. Electronically Signed   By: Elspeth Bathe M.D.   On: 04/16/2024 12:35    Microbiology: Results for orders placed or performed during the hospital encounter of 04/16/24  MRSA Next Gen by PCR, Nasal     Status: None   Collection Time: 04/16/24  5:50 PM   Specimen: Nasal Mucosa; Nasal Swab  Result Value Ref Range Status   MRSA by PCR Next Gen NOT DETECTED NOT DETECTED Final    Comment: (NOTE) The GeneXpert MRSA Assay (FDA approved for NASAL specimens only), is one component of a comprehensive MRSA colonization surveillance program. It is not intended to diagnose MRSA infection nor to guide or monitor treatment for MRSA infections. Test performance is not FDA approved in patients less than 75 years old. Performed at [facility], [address], [place], [place] [address]     Labs: CBC: Recent Labs  Lab 04/16/24 1125 04/18/24 0358  WBC 10.6* 6.6  NEUTROABS 7.9*  --   HGB 16.1* 12.7  HCT 47.0* 37.2  MCV 85.9 85.1  PLT 232 234   Basic Metabolic Panel: Recent Labs  Lab 04/16/24 1125 04/16/24 1710 04/16/24 2115 04/17/24 0458 04/18/24 0358  NA 142 139 138 139 141  K 3.6 3.6 3.5 3.8 3.4*  CL 102 103 103 103 103  CO2 19* 22 21* 22 23  GLUCOSE 101* 117* 79 70 82  BUN 14 9 7* 5* 5*  CREATININE 0.55 0.50 0.48 0.50 0.52  CALCIUM  10.3 8.7*  8.9 9.0 9.0  MG  --   --   --   --  2.2   Liver Function Tests: Recent Labs  Lab 04/16/24 1125 04/18/24 0358  AST 42* 23  ALT 30 17  ALKPHOS 90 69  BILITOT 0.4 0.3  PROT 7.7 5.8*  ALBUMIN  4.7 3.8   CBG: Recent Labs  Lab 04/17/24 1131  04/17/24 1620 04/17/24 2124 04/18/24 0710 04/18/24 1130  GLUCAP 98 79 97 88 83    Discharge time spent: greater than 30 minutes.  Signed: Alm Schneider, MD [facility] 04/18/2024

## 2024-04-18 NOTE — Plan of Care (Signed)

## 2024-04-18 NOTE — Progress Notes (Addendum)
 PROGRESS NOTE  Yanet Balliet FMW:[id number] DOB: 08-Sep-1945 DOA: 04/16/2024 PCP: Rosamond Leta NOVAK, MD  Brief History38  77 year old female with a history of diabetes mellitus type 2, hypertension, coronary disease status post CABG presenting with at least 1 week history of nausea, vomiting, and upper abdominal pain. Patient states that she was placed on azithromycin and prednisone  about 10 days prior to admission.  She took 3 days worth after which she began having nausea and vomiting.  She stated that her sugars were up to near 500.  After she stopped taking the prednisone , her sugars did improve, but she continued to have nausea and vomiting.  She denies any diarrhea, hematochezia, melena, hematemesis.  She has subjective chills. She developed some substernal chest pain with the vomiting, but denied any worsening shortness of breath.  She does not smoke.  She denies any dysuria or hematuria. Because of continued vomiting she presented for further evaluation and treatment. In the ED, the patient was afebrile hemodynamically stable with oxygen  saturation 96% room air.  WBC 10.6, hemoglobin 16.1, platelets 232.  Sodium 139, potassium 3.8, bicarbonate 22, serum creatinine 0.50.  AST 42, ALT 30, alk phosphatase 90, total bilirubin 0.4.  BHA was 4.0.  Patient was initially started on IV fluids and insulin  drip.  This was discontinued after her metabolic acidosis improved. Troponin < 15 x 2.  CT abdomen and pelvis showed interval mild to moderate intrahepatic and extrahepatic biliary ductal dilatation without obstructing stone or mass.  There is a moderate size hiatal hernia.  There is stable moderate pancreatic atrophy.  GI was consulted.   Assessment/Plan:  Intractable nausea and vomiting - Likely precipitated by DKA in the setting of Jardiance  usage -10/30 CT AP as discussed above - MRCP ordered - GI consult appreciated - Started pantoprazole  twice daily - Continue antiemetics -  Continue IV fluids - Holding Jardiance  during hospitalization - lipase 14 - clear liquids for now>>advanced to carb modified which pt tolerated   Diabetes mellitus type 2 - Holding Jardiance , metformin , rapaglinide  - 02/26/24 A1C--7.5 - novolog  sliding scale - we discussed the risk of ketoacidosis with Jardiance .  As her clinical picture may have been clouded by her severe hyperglycemia, we agreed to restart it after d/c with close follow up with PCP   Biliary ductal dilatation -CT AP WC-mild to moderate intra and extrahepatic biliary ductal dilatation, no stone or mass seen, possible stricture, occult mass or noncalcified stone.  - MRCP--chronic mild diffuse intrahepatic and extrahepatic biliary ductal dilatation unchanged from 2023;  No choledocholithasis; no biliary mass, no stricture   Hiatal hernia - Noted on CT - This may be contributing to her continued dyspepsia and vomiting -continue pantoprazole    Coronary disease/chest pain - Chest pain is atypical - Troponins negative x 2 - Personally reviewed EKG--sinus rhythm, no ST ST wave change   Essential hypertension - Resume Imdur  and metoprolol    Mixed hyperlipidemia - Continue statin  Headache -give toradol  x 1 -give fluid bolus  Hypokalemia -replete               Family Communication:   Friend at bedside   Consultants:  GI   Code Status:  FULL   DVT Prophylaxis:  [place] Lovenox      Procedures: As Listed in Progress Note Above   Antibiotics: None           Subjective: Pt complains of headache and dizziness this morning.  States  vomiting is better, but still a little nauseous.  Denies f/c, cp, sob, abd pain, diarrhea, dysuria  Objective: Vitals:   04/18/24 0200 04/18/24 0300 04/18/24 0400 04/18/24 0711  BP: 131/63 (!) 133/58 (!) 140/52   Pulse:      Resp: (!) 22 14 14    Temp:    98.7 F (37.1 C)  TempSrc:    Oral  SpO2:      Weight:      Height:        Intake/Output Summary (Last 24  hours) at 04/18/2024 1126 Last data filed at 04/18/2024 0414 Gross per 24 hour  Intake 1893.21 ml  Output --  Net 1893.21 ml   Weight change:  Exam:  General:  Pt is alert, follows commands appropriately, not in acute distress HEENT: No icterus, No thrush, No neck mass, [place]/AT Cardiovascular: RRR, S1/S2, no rubs, no gallops Respiratory: CTA bilaterally, no wheezing, no crackles, no rhonchi Abdomen: Soft/+BS, non tender, non distended, no guarding Extremities: No edema, No lymphangitis, No petechiae, No rashes, no synovitis Neuro:  CN II-XII intact, strength 4/5 in RUE, RLE, strength 4/5 LUE, LLE; sensation intact bilateral; no dysmetria; babinski equivocal    Data Reviewed: I have personally reviewed following labs and imaging studies Basic Metabolic Panel: Recent Labs  Lab 04/16/24 1125 04/16/24 1710 04/16/24 2115 04/17/24 0458 04/18/24 0358  NA 142 139 138 139 141  K 3.6 3.6 3.5 3.8 3.4*  CL 102 103 103 103 103  CO2 19* 22 21* 22 23  GLUCOSE 101* 117* 79 70 82  BUN 14 9 7* 5* 5*  CREATININE 0.55 0.50 0.48 0.50 0.52  CALCIUM  10.3 8.7* 8.9 9.0 9.0  MG  --   --   --   --  2.2   Liver Function Tests: Recent Labs  Lab 04/16/24 1125 04/18/24 0358  AST 42* 23  ALT 30 17  ALKPHOS 90 69  BILITOT 0.4 0.3  PROT 7.7 5.8*  ALBUMIN  4.7 3.8   Recent Labs  Lab 04/16/24 1125  LIPASE 14   No results for input(s): AMMONIA in the last 168 hours. Coagulation Profile: No results for input(s): INR, PROTIME in the last 168 hours. CBC: Recent Labs  Lab 04/16/24 1125 04/18/24 0358  WBC 10.6* 6.6  NEUTROABS 7.9*  --   HGB 16.1* 12.7  HCT 47.0* 37.2  MCV 85.9 85.1  PLT 232 234   Cardiac Enzymes: No results for input(s): CKTOTAL, CKMB, CKMBINDEX, TROPONINI in the last 168 hours. BNP: Invalid input(s): POCBNP CBG: Recent Labs  Lab 04/17/24 0729 04/17/24 1131 04/17/24 1620 04/17/24 2124 04/18/24 0710  GLUCAP 78 98 79 97 88   HbA1C: No results for  input(s): HGBA1C in the last 72 hours. Urine analysis:    Component Value Date/Time   COLORURINE YELLOW 04/16/2024 1305   APPEARANCEUR CLEAR 04/16/2024 1305   LABSPEC 1.036 (H) 04/16/2024 1305   PHURINE 5.0 04/16/2024 1305   GLUCOSEU >=500 (A) 04/16/2024 1305   HGBUR SMALL (A) 04/16/2024 1305   BILIRUBINUR NEGATIVE 04/16/2024 1305   KETONESUR 80 (A) 04/16/2024 1305   PROTEINUR NEGATIVE 04/16/2024 1305   NITRITE NEGATIVE 04/16/2024 1305   LEUKOCYTESUR NEGATIVE 04/16/2024 1305   Sepsis Labs: @LABRCNTIP (procalcitonin:4,lacticidven:4) ) Recent Results (from the past 240 hours)  MRSA Next Gen by PCR, Nasal     Status: None   Collection Time: 04/16/24  5:50 PM   Specimen: Nasal Mucosa; Nasal Swab  Result Value Ref Range Status   MRSA by PCR Next Gen  NOT DETECTED NOT DETECTED Final    Comment: (NOTE) The GeneXpert MRSA Assay (FDA approved for NASAL specimens only), is one component of a comprehensive MRSA colonization surveillance program. It is not intended to diagnose MRSA infection nor to guide or monitor treatment for MRSA infections. Test performance is not FDA approved in patients less than 78 years old. Performed at [facility], [address] [place],  [address]      Scheduled Meds:  acyclovir   400 mg Oral Daily   aspirin  EC  81 mg Oral Daily   Chlorhexidine  Gluconate Cloth  6 each Topical Q0600   enoxaparin  (LOVENOX ) injection  40 mg Subcutaneous Q24H   insulin  aspart  0-5 Units Subcutaneous QHS   insulin  aspart  0-9 Units Subcutaneous TID WC   isosorbide  mononitrate  30 mg Oral Daily   ketorolac   15 mg Intravenous Once   metoprolol  tartrate  12.5 mg Oral BID   pantoprazole  (PROTONIX ) IV  40 mg Intravenous Q12H   potassium chloride   20 mEq Oral Once   rosuvastatin   40 mg Oral Daily   Continuous Infusions:  Procedures/Studies: MR ABDOMEN MRCP W WO CONTAST Result Date: 04/17/2024 EXAM: MRCP WITHOUT AND WITH IV CONTRAST 04/17/2024 08:49:42 AM TECHNIQUE:  Multisequence, multiplanar magnetic resonance images of the abdomen without and with intravenous contrast. MRCP sequences were performed. COMPARISON: CT abdomen/pelvis dated 04/16/2024 and MRI abdomen dated 01/10/2022. CLINICAL HISTORY: biliary ductal dilation on CT FINDINGS: LIVER: Normal size liver. Normal liver configuration. No liver masses. No significant hepatic steatosis. GALLBLADDER AND BILIARY SYSTEM: Cholecystectomy. Mild diffuse intrahepatic biliary ductal dilatation is not substantially changed from 01/10/2022 MRI. Common bile duct diameter 10 mm, unchanged from prior MRI. No choledocholithiasis. No biliary masses or strictures on MRCP sequences. SPLEEN: Unremarkable. PANCREAS/PANCREATIC DUCT: Visualized pancreas is unremarkable. No pancreatic ductal dilatation. ADRENAL GLANDS: Unremarkable. KIDNEYS: Unremarkable. LYMPH NODES: No enlarged abdominal lymph nodes. VASCULATURE: Atherosclerotic nonaneurysmal abdominal aorta. PERITONEUM: No ascites. ABDOMINAL WALL: No hernia. No mass. BOWEL: Small to moderate hiatal hernia. Otherwise normal nondistended stomach. Grossly unremarkable visualized bowel. No bowel obstruction. BONES: No acute abnormality or worrisome osseous lesion. SOFT TISSUES: Unremarkable. MISCELLANEOUS: Unremarkable. IMPRESSION: 1. Cholecystectomy. 2. Chronic mild diffuse intrahepatic and extrahepatic biliary ductal dilatation, unchanged from 2023 MRI, most compatible with chronic postcholecystectomy effect. No choledocholithiasis or biliary mass/stricture on MRCP sequences. 3. Aortic Atherosclerosis (ICD10-I70.0). Electronically signed by: Selinda Blue MD 04/17/2024 09:05 AM EDT RP Workstation: [id number]   MR 3D Recon At Scanner Result Date: 04/17/2024 EXAM: MRCP WITHOUT AND WITH IV CONTRAST 04/17/2024 08:49:42 AM TECHNIQUE: Multisequence, multiplanar magnetic resonance images of the abdomen without and with intravenous contrast. MRCP sequences were performed. COMPARISON: CT abdomen/pelvis  dated 04/16/2024 and MRI abdomen dated 01/10/2022. CLINICAL HISTORY: biliary ductal dilation on CT FINDINGS: LIVER: Normal size liver. Normal liver configuration. No liver masses. No significant hepatic steatosis. GALLBLADDER AND BILIARY SYSTEM: Cholecystectomy. Mild diffuse intrahepatic biliary ductal dilatation is not substantially changed from 01/10/2022 MRI. Common bile duct diameter 10 mm, unchanged from prior MRI. No choledocholithiasis. No biliary masses or strictures on MRCP sequences. SPLEEN: Unremarkable. PANCREAS/PANCREATIC DUCT: Visualized pancreas is unremarkable. No pancreatic ductal dilatation. ADRENAL GLANDS: Unremarkable. KIDNEYS: Unremarkable. LYMPH NODES: No enlarged abdominal lymph nodes. VASCULATURE: Atherosclerotic nonaneurysmal abdominal aorta. PERITONEUM: No ascites. ABDOMINAL WALL: No hernia. No mass. BOWEL: Small to moderate hiatal hernia. Otherwise normal nondistended stomach. Grossly unremarkable visualized bowel. No bowel obstruction. BONES: No acute abnormality or worrisome osseous lesion. SOFT TISSUES: Unremarkable. MISCELLANEOUS: Unremarkable. IMPRESSION: 1. Cholecystectomy. 2. Chronic mild diffuse  intrahepatic and extrahepatic biliary ductal dilatation, unchanged from 2023 MRI, most compatible with chronic postcholecystectomy effect. No choledocholithiasis or biliary mass/stricture on MRCP sequences. 3. Aortic Atherosclerosis (ICD10-I70.0). Electronically signed by: Selinda Blue MD 04/17/2024 09:05 AM EDT RP Workstation: [id number]   CT ABDOMEN PELVIS W CONTRAST Result Date: 04/16/2024 CLINICAL DATA:  Epigastric abdominal pain with nausea and vomiting for the past week. EXAM: CT ABDOMEN AND PELVIS WITH CONTRAST TECHNIQUE: Multidetector CT imaging of the abdomen and pelvis was performed using the standard protocol following bolus administration of intravenous contrast. RADIATION DOSE REDUCTION: This exam was performed according to the departmental dose-optimization program which  includes automated exposure control, adjustment of the mA and/or kV according to patient size and/or use of iterative reconstruction technique. CONTRAST:  OMNIPAQUE  IOHEXOL  300 MG/ML  SOLN COMPARISON:  09/24/2023 and 05/12/2020 FINDINGS: Lower chest: Moderate-sized hiatal hernia with a paraesophageal component and an interval significant increase in size. Normal sized heart. 3 mm left lower lobe nodule on image number 2/4, unchanged since 05/12/2020, not requiring imaging follow-up. Similar right lower lobe nodule on image number 1/4, also unchanged and not needing imaging follow-up. Minimal bilateral dependent atelectasis. Hepatobiliary: Cholecystectomy clips are again demonstrated with interval mild-to-moderate intrahepatic and extrahepatic biliary ductal dilatation with no obstructing stone or mass seen. The common duct currently measures 12 mm in maximum diameter on image number 22/4, previously 7 mm on 09/24/2023. Pancreas: Stable moderate atrophy of the pancreas with minimally progressive pancreatic ductal dilatation. Spleen: Normal in size without focal abnormality. Adrenals/Urinary Tract: Adrenal glands are unremarkable. Kidneys are normal, without renal calculi, focal lesion, or hydronephrosis. Bladder is unremarkable. Stomach/Bowel: Moderate-sized hiatal hernia with a paraesophageal component and an interval significant increase in size. Surgically absent appendix. Unremarkable colon and small bowel. Vascular/Lymphatic: Atheromatous arterial calcifications without aneurysm. No enlarged lymph nodes. Reproductive: Status post hysterectomy. No adnexal masses. Other: No abdominal wall hernia or abnormality. No abdominopelvic ascites. Musculoskeletal: Lumbar and lower thoracic spine degenerative changes. IMPRESSION: 1. Interval mild-to-moderate intrahepatic and extrahepatic biliary ductal dilatation with no obstructing stone or mass seen. This could be due to a stricture, occult mass or noncalcified  stone. This could be further evaluated with MRCP. 2. Moderate-sized hiatal hernia with a paraesophageal component and an interval significant increase in size. 3. Stable moderate atrophy of the pancreas with minimally progressive pancreatic ductal dilatation. Electronically Signed   By: Elspeth Bathe M.D.   On: 04/16/2024 14:20   DG Chest Portable 1 View Result Date: 04/16/2024 CLINICAL DATA:  Chest pain and nausea for the past 2 weeks. Diarrhea. EXAM: PORTABLE CHEST 1 VIEW COMPARISON:  03/18/2023 FINDINGS: Normal sized heart. Stable post CABG changes. Clear lungs with normal vascularity. Small to moderate-sized hiatal hernia. Diffuse osteopenia. IMPRESSION: 1. No acute abnormality. 2. Small to moderate-sized hiatal hernia. Electronically Signed   By: Elspeth Bathe M.D.   On: 04/16/2024 12:35    Alm Schneider, DO  [facility]  If 7PM-7AM, please contact night-coverage [email or web address] Password [email or web address] 04/18/2024, 11:26 AM   LOS: 2 days

## 2024-04-20 ENCOUNTER — Ambulatory Visit (INDEPENDENT_AMBULATORY_CARE_PROVIDER_SITE_OTHER)

## 2024-04-20 ENCOUNTER — Telehealth (INDEPENDENT_AMBULATORY_CARE_PROVIDER_SITE_OTHER): Payer: Self-pay | Admitting: Gastroenterology

## 2024-04-20 ENCOUNTER — Ambulatory Visit
Admission: EM | Admit: 2024-04-20 | Discharge: 2024-04-20 | Disposition: A | Discharge status: Home / Self Care | Attending: Nurse Practitioner | Admitting: Nurse Practitioner

## 2024-04-20 ENCOUNTER — Telehealth: Payer: Self-pay

## 2024-04-20 DIAGNOSIS — M25521 Pain in right elbow: Secondary | ICD-10-CM

## 2024-04-20 MED ORDER — AMOXICILLIN-POT CLAVULANATE 875-125 MG PO TABS: 1.0000 | ORAL_TABLET | Freq: Two times a day (BID) | ORAL | 0 refills | Status: AC

## 2024-04-20 NOTE — ED Provider Notes (Signed)
 [facility]    CSN: [id number] Arrival date & time: 04/20/24  1146      History   Chief Complaint No chief complaint on file.   HPI Katrina Castro is a 78 y.o. female.   The history is provided by the patient and a relative.   Patient brought in by her daughter for complaints of pain, swelling, and redness to the right elbow.  Patient and daughter report the patient was recently discharged from the hospital for DKA.  Daughter reports that the patient may have hit her elbow while she was in the hospital.  Patient states that the pain radiates into her right hand.  She also states that she has pain when she raises the right arm.  Patient denies numbness, tingling, or decreased range of motion.  Past Medical History:  Diagnosis Date   Arthritis    CAD (coronary artery disease)    a. s/p CABG on 04/27/2020 with LIMA-LAD, seq SVG-PDA-PL, seq left radial-RI-OM   Concussion    2015   Depression    Diabetes (HCC)    type 2   Dysphagia    Dysrhythmia    afib   GERD (gastroesophageal reflux disease)    Headache    History of bronchitis    Hypertension    Nausea and vomiting 04/17/2024   Seasonal allergies    Toenail fungus     Patient Active Problem List   Diagnosis Date Noted   Diabetic ketoacidosis without coma associated with type 2 diabetes mellitus (HCC) 04/17/2024   Dilation of biliary tract 04/17/2024   Dehydration 04/17/2024   Nausea and vomiting 04/17/2024   Intractable vomiting 04/16/2024   Anemia 10/01/2023   Nausea without vomiting 10/01/2023   Generalized abdominal pain 10/01/2023   Melena 10/01/2023   Acute diarrhea 03/29/2023   Bloating 03/29/2023   Lumbar stenosis with neurogenic claudication 11/20/2022   Constipation 12/29/2020   Fever 05/12/2020   S/P CABG x 5 04/27/2020   Unstable angina (HCC) 04/25/2020   Hx of total knee arthroplasty, right 04/03/2017   New onset a-fib (HCC) 05/18/2016   Hypokalemia 05/18/2016   Diabetes (HCC)  05/18/2016   Atrial fibrillation (HCC) 05/18/2016   Migraine without aura and without status migrainosus, not intractable 05/10/2015   GERD (gastroesophageal reflux disease) 01/07/2012   Dysphagia 11/26/2011    Past Surgical History:  Procedure Laterality Date   APPENDECTOMY     BACK SURGERY     x2   BOTOX  INJECTION N/A 06/06/2016   Procedure: BOTOX  INJECTION;  Surgeon: Claudis RAYMOND Rivet, MD;  [facility]: [facility];  Service: Endoscopy;  Laterality: N/A;   BOTOX  INJECTION N/A 01/17/2021   Procedure: BOTOX  INJECTION;  Surgeon: Eartha Angelia Sieving, MD;  [facility]: [facility];  Service: Gastroenterology;  Laterality: N/A;   BOTOX  INJECTION N/A 07/11/2021   Procedure: BOTOX  INJECTION;  Surgeon: Eartha Angelia Sieving, MD;  [facility]: [facility];  Service: Gastroenterology;  Laterality: N/A;   BOTOX  INJECTION N/A 08/21/2022   Procedure: BOTOX  INJECTION;  Surgeon: Eartha Angelia Sieving, MD;  [facility]: [facility];  Service: Gastroenterology;  Laterality: N/A;   CHOLECYSTECTOMY     COLONOSCOPY N/A 11/05/2012   Procedure: COLONOSCOPY;  Surgeon: Claudis RAYMOND Rivet, MD;  [facility]: [facility];  Service: Endoscopy;  Laterality: N/A;  1030   COLONOSCOPY N/A 10/08/2023   Procedure: COLONOSCOPY;  Surgeon: Eartha Angelia Sieving, MD;  [facility];  Service: Gastroenterology;  Laterality: N/A;   COLONOSCOPY WITH PROPOFOL   N/A 05/01/2019   Procedure: COLONOSCOPY WITH PROPOFOL ;  Surgeon: Golda Claudis PENNER, MD;  [facility]: [facility];  Service: Endoscopy;  Laterality: N/A;  11:20am   CORONARY ARTERY BYPASS GRAFT N/A 04/27/2020   Procedure: CORONARY ARTERY BYPASS GRAFTING (CABG) TIMES FIVE USING LEFT INTERNAL MAMMARY ARTERY, LEFT HARVESTED RADIAL ARTERY, RIGHT GREATER SAPHENOUS VEIN HARVESTED ENDOSCOPICALLY.;  Surgeon: German Bartlett PEDLAR, MD;  Location: [facility];  Service: Open Heart Surgery;  Laterality: N/A;   ESOPHAGEAL DILATION N/A 07/06/2015   Procedure: ESOPHAGEAL DILATION;   Surgeon: Claudis PENNER Golda, MD;  [facility];  Service: Endoscopy;  Laterality: N/A;   ESOPHAGEAL DILATION N/A 06/06/2016   Procedure: ESOPHAGEAL DILATION;  Surgeon: Claudis PENNER Golda, MD;  [facility];  Service: Endoscopy;  Laterality: N/A;   ESOPHAGEAL DILATION N/A 08/21/2022   Procedure: ESOPHAGEAL DILATION;  Surgeon: Eartha Angelia Sieving, MD;  [facility];  Service: Gastroenterology;  Laterality: N/A;   ESOPHAGOGASTRODUODENOSCOPY N/A 02/12/2014   Procedure: ESOPHAGOGASTRODUODENOSCOPY (EGD);  Surgeon: Claudis PENNER Golda, MD;  [facility];  Service: Endoscopy;  Laterality: N/A;  150   ESOPHAGOGASTRODUODENOSCOPY N/A 07/06/2015   Procedure: ESOPHAGOGASTRODUODENOSCOPY (EGD);  Surgeon: Claudis PENNER Golda, MD;  [facility];  Service: Endoscopy;  Laterality: N/A;  1:25 - moved to 1/18 @ 10:30 - Ann to notify pt   ESOPHAGOGASTRODUODENOSCOPY N/A 10/08/2023   Procedure: EGD (ESOPHAGOGASTRODUODENOSCOPY);  Surgeon: Eartha Angelia, Sieving, MD;  [facility];  Service: Gastroenterology;  Laterality: N/A;  10:30AM;ASA 3   ESOPHAGOGASTRODUODENOSCOPY (EGD) WITH ESOPHAGEAL DILATION N/A 07/25/2012   Procedure: ESOPHAGOGASTRODUODENOSCOPY (EGD) WITH ESOPHAGEAL DILATION;  Surgeon: Claudis PENNER Golda, MD;  [facility];  Service: Endoscopy;  Laterality: N/A;  325-rescheduled to 855 Ann notified pt   ESOPHAGOGASTRODUODENOSCOPY (EGD) WITH PROPOFOL  N/A 06/06/2016   Procedure: ESOPHAGOGASTRODUODENOSCOPY (EGD) WITH PROPOFOL ;  Surgeon: Claudis PENNER Golda, MD;  [facility];  Service: Endoscopy;  Laterality: N/A;   ESOPHAGOGASTRODUODENOSCOPY (EGD) WITH PROPOFOL  N/A 01/17/2021   Procedure: ESOPHAGOGASTRODUODENOSCOPY (EGD) WITH PROPOFOL ;  Surgeon: Eartha Angelia Sieving, MD;  [facility];  Service: Gastroenterology;  Laterality: N/A;  10:35   ESOPHAGOGASTRODUODENOSCOPY (EGD) WITH PROPOFOL  N/A 07/11/2021   Procedure:  ESOPHAGOGASTRODUODENOSCOPY (EGD) WITH PROPOFOL ;  Surgeon: Eartha Angelia Sieving, MD;  [facility];  Service: Gastroenterology;  Laterality: N/A;  11:15   ESOPHAGOGASTRODUODENOSCOPY (EGD) WITH PROPOFOL  N/A 08/21/2022   Procedure: ESOPHAGOGASTRODUODENOSCOPY (EGD) WITH PROPOFOL ;  Surgeon: Eartha Angelia Sieving, MD;  [facility];  Service: Gastroenterology;  Laterality: N/A;  1115AM, ASA 2   EYE SURGERY     cataracts removed   Foot surgeries Bilateral    hammer toes   LEFT HEART CATH AND CORONARY ANGIOGRAPHY N/A 04/26/2020   Procedure: LEFT HEART CATH AND CORONARY ANGIOGRAPHY;  Surgeon: Jordan, Peter M, MD;  Location: [facility];  Service: Cardiovascular;  Laterality: N/A;   LUMBAR LAMINECTOMY/DECOMPRESSION MICRODISCECTOMY Bilateral 11/20/2022   Procedure: Lumbar Five-Sacral One Laminectomy and Foraminotomy;  Surgeon: Louis Shove, MD;  Location: [facility] OR;  Service: Neurosurgery;  Laterality: Bilateral;  3C   MALONEY DILATION N/A 02/12/2014   Procedure: MALONEY DILATION;  Surgeon: Claudis PENNER Golda, MD;  [facility];  Service: Endoscopy;  Laterality: N/A;   POLYPECTOMY  05/01/2019   Procedure: POLYPECTOMY;  Surgeon: Golda Claudis PENNER, MD;  [facility];  Service: Endoscopy;;  colon    RADIAL ARTERY HARVEST Left 04/27/2020   Procedure: LEFT RADIAL ARTERY HARVEST;  Surgeon:  German Bartlett PEDLAR, MD;  Location: [facility];  Service: Open Heart Surgery;  Laterality: Left;   Right knee arthroscopy     x2   TEE WITHOUT CARDIOVERSION N/A 04/27/2020   Procedure: TRANSESOPHAGEAL ECHOCARDIOGRAM (TEE);  Surgeon: German Bartlett PEDLAR, MD;  Location: [facility] OR;  Service: Open Heart Surgery;  Laterality: N/A;   TONSILLECTOMY     TOTAL ABDOMINAL HYSTERECTOMY     TOTAL KNEE ARTHROPLASTY Right 04/03/2017   Procedure: RIGHT TOTAL KNEE ARTHROPLASTY;  Surgeon: Heide Ingle, MD;  Location: [facility];  Service: Orthopedics;  Laterality: Right;    OB History   No obstetric  history on file.      Home Medications    Prior to Admission medications   Medication Sig Start Date End Date Taking? Authorizing Provider  acyclovir  (ZOVIRAX ) 400 MG tablet Take 400 mg by mouth daily.    [provider]  ALPRAZolam  (XANAX ) 0.5 MG tablet Take 0.5 mg by mouth at bedtime as needed for anxiety or sleep. 03/25/24   [provider]  aspirin  81 MG EC tablet Take 1 tablet (81 mg total) by mouth daily. Swallow whole. 07/21/21   Okey Vina GAILS, MD  Continuous Glucose Receiver (DEXCOM G7 RECEIVER) DEVI Use to monitor glucose continuously 03/30/24   Trixie File, MD  Continuous Glucose Sensor (DEXCOM G7 SENSOR) MISC Use to check glucose continuously, change sensor every 10 days 03/30/24   Trixie File, MD  cyanocobalamin  (VITAMIN B12) 1000 MCG/ML injection Inject 1,000 mcg into the muscle every 28 (twenty-eight) days. 03/25/24   [provider]  empagliflozin  (JARDIANCE ) 25 MG TABS tablet take 1 tablet by mouth daily before breakfast. 12/12/23   Trixie File, MD  ezetimibe  (ZETIA ) 10 MG tablet take 1 tablet once daily. 09/16/23   Miriam Norris, NP  isosorbide  mononitrate (IMDUR ) 30 MG 24 hr tablet take 1 tablet (30 milligram total) by mouth daily. Patient taking differently: Take 30 mg by mouth daily. 02/06/24   Okey Vina GAILS, MD  levocetirizine (XYZAL ) 5 MG tablet Take 5 mg by mouth every evening.    [provider]  linaclotide  (LINZESS ) 72 MCG capsule Take 1 capsule (72 mcg total) by mouth daily before breakfast. 10/08/23   Eartha Flavors, Toribio, MD  meclizine (ANTIVERT) 25 MG tablet Take 25 mg by mouth 2 (two) times daily as needed for dizziness. 04/01/24   [provider]  metFORMIN  (GLUCOPHAGE ) 1000 MG tablet Take 1 tablet (1,000 mg total) by mouth daily with breakfast. 05/09/23   Trixie File, MD  metoprolol  tartrate (LOPRESSOR ) 25 MG tablet take (1/2) tablet by mouth 2 times a day. Patient taking differently: Take  12.5 mg by mouth 2 (two) times daily. 09/16/23   Miriam Norris, NP  nitroGLYCERIN  (NITROSTAT ) 0.4 MG SL tablet Place 1 tablet (0.4 mg total) under the tongue every 5 (five) minutes as needed for chest pain. 11/06/21   Okey Vina GAILS, MD  omeprazole  (PRILOSEC) 40 MG capsule take 1 capsule (40 MILLIGRAM total) by mouth daily. Patient taking differently: Take 40 mg by mouth daily. 12/09/23   Carlan, Chelsea L, NP  ondansetron  (ZOFRAN -ODT) 4 MG disintegrating tablet Take 4 mg by mouth every 8 (eight) hours as needed for nausea. 04/13/24 04/20/24  [provider]  oxybutynin  (DITROPAN -XL) 10 MG 24 hr tablet Take 10 mg by mouth every evening.    [provider]  repaglinide  (PRANDIN ) 0.5 MG tablet Take 2 tablets (1 mg total) by mouth daily before breakfast. Take 2 tablet (0.5 mg total)  by mouth daily before breakfast. 03/18/24   Trixie File, MD  rosuvastatin  (CRESTOR ) 40 MG tablet take 1 tablet by mouth once daily. 09/16/23   Miriam Norris, NP    Family History Family History  Problem Relation Age of Onset   Diabetes Mother    Stroke Father    Diabetes Sister    Colon cancer Neg Hx    Breast cancer Neg Hx     Social History Social History   Tobacco Use   Smoking status: Never   Smokeless tobacco: Never  Vaping Use   Vaping status: Never Used  Substance Use Topics   Alcohol  use: No    Alcohol /week: 0.0 standard drinks of alcohol     Comment: socially    Drug use: No     Allergies   Bee venom, Levofloxacin, Cardizem  [diltiazem ], Diazepam, Lipitor [atorvastatin], and Sulfa antibiotics   Review of Systems Review of Systems Per HPI  Physical Exam Triage Vital Signs ED Triage Vitals  Encounter Vitals Group     BP 04/20/24 1155 129/69     Girls Systolic BP Percentile --      Girls Diastolic BP Percentile --      Boys Systolic BP Percentile --      Boys Diastolic BP Percentile --      Pulse Rate 04/20/24 1155 90     Resp 04/20/24 1155 16     Temp 04/20/24  1155 98.7 F (37.1 C)     Temp Source 04/20/24 1155 Oral     SpO2 04/20/24 1155 92 %     Weight --      Height --      Head Circumference --      Peak Flow --      Pain Score 04/20/24 1158 10     Pain Loc --      Pain Education --      Exclude from Growth Chart --    No data found.  Updated Vital Signs BP 129/69 (BP Location: Right Arm)   Pulse 90   Temp 98.7 F (37.1 C) (Oral)   Resp 16   SpO2 92%   Visual Acuity Right Eye Distance:   Left Eye Distance:   Bilateral Distance:    Right Eye Near:   Left Eye Near:    Bilateral Near:     Physical Exam Vitals and nursing note reviewed.  Constitutional:      General: She is not in acute distress.    Appearance: Normal appearance.  HENT:     Head: Normocephalic.  Eyes:     Extraocular Movements: Extraocular movements intact.     Pupils: Pupils are equal, round, and reactive to light.  Cardiovascular:     Rate and Rhythm: Normal rate and regular rhythm.     Pulses: Normal pulses.     Heart sounds: Normal heart sounds.  Pulmonary:     Effort: Pulmonary effort is normal. No respiratory distress.     Breath sounds: Normal breath sounds. No stridor. No wheezing, rhonchi or rales.  Musculoskeletal:     Right upper arm: Normal.     Right elbow: Swelling present. Decreased range of motion. Tenderness present in lateral epicondyle.     Right forearm: Normal.     Cervical back: Normal range of motion.     Comments: Swelling is, erythema, and tenderness noted to the right lateral elbow.  There is no bruising present.  Skin:    General: Skin is warm and dry.  Neurological:     General: No focal deficit present.     Mental Status: She is alert and oriented to person, place, and time.  Psychiatric:        Mood and Affect: Mood normal.        Behavior: Behavior normal.      UC Treatments / Results  Labs (all labs ordered are listed, but only abnormal results are displayed) Labs Reviewed - No data to  display  EKG   Radiology No results found.  Procedures Procedures (including critical care time)  Medications Ordered in UC Medications - No data to display  Initial Impression / Assessment and Plan / UC Course  I have reviewed the triage vital signs and the nursing notes.  Pertinent labs & imaging results that were available during my care of the patient were reviewed by me and considered in my medical decision making (see chart for details).  X-ray of the patient's right elbow is pending.  On exam, she does not have any obvious bruising or deformity present.  She does have erythema and tenderness along with swelling noted to the lateral epicondyle.  Cannot rule out localized cellulitis.  Will avoid use of Cotacort steroids as patient was recently discharged for DKA.  Will treat empirically with Augmentin 875/125 mg twice daily for possible bursitis.  Supportive care recommendations were provided and discussed with the patient and her daughter to include over-the-counter analgesics such as Tylenol , and the use of ice.  Discussed indications with the patient's daughter regarding follow-up.  Patient and daughter were in agreement with this plan of care and verbalized understanding.  All questions were answered.  Patient stable for discharge.  Final Clinical Impressions(s) / UC Diagnoses   Final diagnoses:  Right elbow pain   Discharge Instructions   None    ED Prescriptions   None    PDMP not reviewed this encounter.   Gilmer Etta PARAS, NP 04/20/24 1248

## 2024-04-20 NOTE — Telephone Encounter (Signed)
 Provider Etta CROME. Reviewed x-rays results and stated the xray was negative.  Called pt, her daughter spoke with me and verbalized understanding, no questions or concerns.

## 2024-04-20 NOTE — Discharge Instructions (Addendum)
 X-ray of the right elbow is pending.  You will be contacted when the results of the x-ray are received. Take medication as prescribed. A sling has been provided to allow for support of your right elbow.  You do need to remove the sling throughout the day to allow good range of motion of your right elbow and upper extremity. You may take over-the-counter Tylenol  as needed for pain or discomfort. Apply ice to the affected area to help with pain and swelling.  Apply for 20 minutes, remove for 1 hour, repeat as much as possible while symptoms persist. If your symptoms fail to improve with this treatment, please follow-up with your primary care physician for further evaluation. Follow-up as needed.

## 2024-04-20 NOTE — Telephone Encounter (Signed)
 I called and left a Vm, asking that the patient please return call to the office if she is still needing assistance.

## 2024-04-20 NOTE — Telephone Encounter (Signed)
 Patient left message asking for someone to call her back at [phone].  I called and left her a message that she needed a HOS FU OV and she was scheduled for 11/20 at 350 with Dr Eartha and to call me back to confirm she got my message.

## 2024-04-20 NOTE — ED Triage Notes (Signed)
 Per daughter pt has swelling and pain in the elbow of the right arm. Daughter states she was recently in the hospital and thinks it may have started there pt was discharged on Saturday.

## 2024-04-22 ENCOUNTER — Telehealth: Payer: Self-pay | Admitting: Gastroenterology

## 2024-04-22 NOTE — Telephone Encounter (Signed)
 Patient called/left message, telling about her ER visit and having gout and taking antibiotics. She asked for a return call, but her call dropped during her message. She has a FU OV scheduled for 11/20 at 350 with Dr Eartha. [phone]

## 2024-04-22 NOTE — Telephone Encounter (Signed)
 I spoke with the patient she says she saw her pcp yesterday, Dr. Rosamond and he was treating her for gout and placed her on Colchicine  . She says she has not had a Bm in nine days, she says Dr. Rosamond had told her to pick up a clear liquid for her constipation, and drink this to help her have a bm. I asked her what the name of it was she says she did not know. I advised that she needed to call Dr. Rosamond back and ask him what the name of the liquid was. She says she would. She is able to pass gas. Please advise if you have any more recommendations for her. Thanks,

## 2024-04-22 NOTE — Telephone Encounter (Signed)
 I spoke with the patient and made her aware per [facility], Sounds like possibly magnesium  citrate which is fine for her to try once she confirms with him what his recommendation is. As long as she is passing flatus and tolerating POs, can proceed with laxative to see if that gets her bowels moving and let us  know if there is no improvement. Patient says yes it was the magnesium  citrate that Dr. Rosamond wanted her to take. I made her aware ok to take from GI perspective, and to let us  know if no improvement. Patient states understanding of all.

## 2024-05-01 ENCOUNTER — Other Ambulatory Visit: Payer: Self-pay | Admitting: Internal Medicine

## 2024-05-06 ENCOUNTER — Encounter: Payer: Self-pay | Admitting: Internal Medicine

## 2024-05-06 ENCOUNTER — Other Ambulatory Visit

## 2024-05-06 ENCOUNTER — Ambulatory Visit (INDEPENDENT_AMBULATORY_CARE_PROVIDER_SITE_OTHER): Admitting: Internal Medicine

## 2024-05-06 VITALS — BP 122/70 | HR 67 | Ht 62.0 in | Wt 126.6 lb

## 2024-05-06 DIAGNOSIS — Z7984 Long term (current) use of oral hypoglycemic drugs: Secondary | ICD-10-CM

## 2024-05-06 DIAGNOSIS — E1165 Type 2 diabetes mellitus with hyperglycemia: Secondary | ICD-10-CM

## 2024-05-06 DIAGNOSIS — E785 Hyperlipidemia, unspecified: Secondary | ICD-10-CM | POA: Diagnosis not present

## 2024-05-06 DIAGNOSIS — E1159 Type 2 diabetes mellitus with other circulatory complications: Secondary | ICD-10-CM

## 2024-05-06 LAB — POCT GLYCOSYLATED HEMOGLOBIN (HGB A1C): Hemoglobin A1C: 6.8 % — AB (ref 4.0–5.6)

## 2024-05-06 NOTE — Patient Instructions (Addendum)
 Please continue: - Metformin  1000 mg in am  STOP JARDIANCE .  Move: - Repaglinide  (Prandin ) 1 mg before breakfast   Please return in 2-3 months.

## 2024-05-06 NOTE — Addendum Note (Signed)
 Addended by: CLEOTILDE ROLIN RAMAN on: 05/06/2024 11:44 AM   Modules accepted: Orders

## 2024-05-06 NOTE — Progress Notes (Addendum)
 Patient ID: Katrina Castro, female   DOB: September 24, 1945, 78 y.o.   MRN: [id number]  HPI: Katrina Castro is a 78 y.o.-year-old female, returning for follow-up for DM2, dx in 2015, noninsulin-dependent, uncontrolled, with complications (CAD, s/p CABG; PN). Pt. previously saw Dr. Kassie.  Last visit with me 2 months ago.  Interim history: She also has dysphagia-had an EGD which showed reactive gastropathy. She returns sooner than the scheduled appointment the due to recent admission for DKA 04/16/2024 after starting Prednisone  and ABx for bronchitis. She had nausea and vomiting, also back pain.  These have resolved now.  She was discharged back on the previous diabetic regimen. She also has a history of IBS with abdominal pain.  On dicyclomine  and Linzess .  Reviewed HbA1c: Lab Results  Component Value Date   HGBA1C 7.5 (A) 02/26/2024   HGBA1C 6.3 (A) 10/24/2023   HGBA1C 6.6 (A) 05/09/2023   HGBA1C 6.7 12/27/2022   HGBA1C 7.6 (A) 08/02/2022   HGBA1C 7.8 (H) 03/23/2022   HGBA1C 7.0 (A) 11/17/2021   HGBA1C 7.3 (A) 09/20/2021   HGBA1C 8.0 (A) 07/26/2021   HGBA1C 6.4 (H) 05/12/2020  02/15/2023: HbA1c 6.4% 12/27/2022: HbA1c 6.7%  Pt is on a regimen of: - Metformin  ER 500 mg 2x daily >> Metformin  1000 mg in am  - Jardiance  25 mg daily  - Glipizide  5 mg before breakfast - we tried to stop this but sugars increased after this meal >> recommended to restart 5 mg before breakfast >>  taking it 2x a day, after meals >> changed to repaglinide  0.5 >> 1 mg before breakfast >> actually taking it before dinner! Previously on Lantus  6 units daily in am >> inconsistently >> stopped 12/2022 She tried Rybelsus  >> nausea, but retrospectively, this was 2/2 Esophageal pbs. She did not qualify for the patient assistance program for Farxiga  before.  Pt checks her sugars >4x a day:  Previously:  Previously:  Lowest sugar was 50-60s >> 60s; she has hypoglycemia awareness at 70.  Highest sugar was 300s >>  200s >> 200s.  Glucometer:Prodigy  - no CKD, last BUN/creatinine:  Lab Results  Component Value Date   BUN 5 (L) 04/18/2024   BUN 5 (L) 04/17/2024   CREATININE 0.52 04/18/2024   CREATININE 0.50 04/17/2024   No results found for: MICRALBCREAT She is not on ACE inhibitor or ARB.  -+ HL; last set of lipids: 05/03/2023: 133/209/42/57 02/20/2023: 192/96/41/133 09/24/2022: 125/100/63/44 Lab Results  Component Value Date   CHOL 112 03/23/2022   HDL 63 09/24/2022   LDLCALC 36 03/23/2022   TRIG 100 09/24/2022   CHOLHDL 2.3 03/23/2022  On Crestor  40, Zetia  10.  - last eye exam was in 09/2021. No DR - reportedly.   - no numbness and tingling in her feet.  She has a history of left foot surgery.  Last foot exam was here in clinic 02/26/2024.  She also has a history of atrial fibrillation, HTN, GERD, depression. She is on B12 supplements.  She goes to the gym - treadmill. She is working part time - caregiver for cancer patient.  She used to drive a truck for many years. She was abandoned by her mother in a hotel when she was 20 weeks old.  ROS: + see HPI  Past Medical History:  Diagnosis Date   Arthritis    CAD (coronary artery disease)    a. s/p CABG on 04/27/2020 with LIMA-LAD, seq SVG-PDA-PL, seq left radial-RI-OM   Concussion    2015  Depression    Diabetes (HCC)    type 2   Dysphagia    Dysrhythmia    afib   GERD (gastroesophageal reflux disease)    Headache    History of bronchitis    Hypertension    Nausea and vomiting 04/17/2024   Seasonal allergies    Toenail fungus    Past Surgical History:  Procedure Laterality Date   APPENDECTOMY     BACK SURGERY     x2   BOTOX  INJECTION N/A 06/06/2016   Procedure: BOTOX  INJECTION;  Surgeon: Claudis RAYMOND Rivet, MD;  [facility]: [facility];  Service: Endoscopy;  Laterality: N/A;   BOTOX  INJECTION N/A 01/17/2021   Procedure: BOTOX  INJECTION;  Surgeon: Eartha Angelia Sieving, MD;  [facility]: [facility];  Service:  Gastroenterology;  Laterality: N/A;   BOTOX  INJECTION N/A 07/11/2021   Procedure: BOTOX  INJECTION;  Surgeon: Eartha Angelia Sieving, MD;  [facility]: [facility];  Service: Gastroenterology;  Laterality: N/A;   BOTOX  INJECTION N/A 08/21/2022   Procedure: BOTOX  INJECTION;  Surgeon: Eartha Angelia Sieving, MD;  [facility]: [facility];  Service: Gastroenterology;  Laterality: N/A;   CHOLECYSTECTOMY     COLONOSCOPY N/A 11/05/2012   Procedure: COLONOSCOPY;  Surgeon: Claudis RAYMOND Rivet, MD;  [facility]: [facility];  Service: Endoscopy;  Laterality: N/A;  1030   COLONOSCOPY N/A 10/08/2023   Procedure: COLONOSCOPY;  Surgeon: Eartha Angelia Sieving, MD;  [facility]: [facility];  Service: Gastroenterology;  Laterality: N/A;   COLONOSCOPY WITH PROPOFOL  N/A 05/01/2019   Procedure: COLONOSCOPY WITH PROPOFOL ;  Surgeon: Rivet Claudis RAYMOND, MD;  [facility]: [facility];  Service: Endoscopy;  Laterality: N/A;  11:20am   CORONARY ARTERY BYPASS GRAFT N/A 04/27/2020   Procedure: CORONARY ARTERY BYPASS GRAFTING (CABG) TIMES FIVE USING LEFT INTERNAL MAMMARY ARTERY, LEFT HARVESTED RADIAL ARTERY, RIGHT GREATER SAPHENOUS VEIN HARVESTED ENDOSCOPICALLY.;  Surgeon: German Bartlett PEDLAR, MD;  Location: [facility];  Service: Open Heart Surgery;  Laterality: N/A;   ESOPHAGEAL DILATION N/A 07/06/2015   Procedure: ESOPHAGEAL DILATION;  Surgeon: Claudis RAYMOND Rivet, MD;  [facility];  Service: Endoscopy;  Laterality: N/A;   ESOPHAGEAL DILATION N/A 06/06/2016   Procedure: ESOPHAGEAL DILATION;  Surgeon: Claudis RAYMOND Rivet, MD;  [facility];  Service: Endoscopy;  Laterality: N/A;   ESOPHAGEAL DILATION N/A 08/21/2022   Procedure: ESOPHAGEAL DILATION;  Surgeon: Eartha Angelia Sieving, MD;  [facility];  Service: Gastroenterology;  Laterality: N/A;   ESOPHAGOGASTRODUODENOSCOPY N/A 02/12/2014   Procedure: ESOPHAGOGASTRODUODENOSCOPY (EGD);  Surgeon: Claudis RAYMOND Rivet, MD;  [facility];  Service:  Endoscopy;  Laterality: N/A;  150   ESOPHAGOGASTRODUODENOSCOPY N/A 07/06/2015   Procedure: ESOPHAGOGASTRODUODENOSCOPY (EGD);  Surgeon: Claudis RAYMOND Rivet, MD;  [facility];  Service: Endoscopy;  Laterality: N/A;  1:25 - moved to 1/18 @ 10:30 - Ann to notify pt   ESOPHAGOGASTRODUODENOSCOPY N/A 10/08/2023   Procedure: EGD (ESOPHAGOGASTRODUODENOSCOPY);  Surgeon: Eartha Angelia, Sieving, MD;  [facility];  Service: Gastroenterology;  Laterality: N/A;  10:30AM;ASA 3   ESOPHAGOGASTRODUODENOSCOPY (EGD) WITH ESOPHAGEAL DILATION N/A 07/25/2012   Procedure: ESOPHAGOGASTRODUODENOSCOPY (EGD) WITH ESOPHAGEAL DILATION;  Surgeon: Claudis RAYMOND Rivet, MD;  [facility];  Service: Endoscopy;  Laterality: N/A;  325-rescheduled to 855 Ann notified pt   ESOPHAGOGASTRODUODENOSCOPY (EGD) WITH PROPOFOL  N/A 06/06/2016   Procedure: ESOPHAGOGASTRODUODENOSCOPY (EGD) WITH PROPOFOL ;  Surgeon: Claudis RAYMOND Rivet, MD;  [facility];  Service: Endoscopy;  Laterality: N/A;   ESOPHAGOGASTRODUODENOSCOPY (EGD) WITH PROPOFOL   N/A 01/17/2021   Procedure: ESOPHAGOGASTRODUODENOSCOPY (EGD) WITH PROPOFOL ;  Surgeon: Eartha Angelia Sieving, MD;  [facility];  Service: Gastroenterology;  Laterality: N/A;  10:35   ESOPHAGOGASTRODUODENOSCOPY (EGD) WITH PROPOFOL  N/A 07/11/2021   Procedure: ESOPHAGOGASTRODUODENOSCOPY (EGD) WITH PROPOFOL ;  Surgeon: Eartha Angelia Sieving, MD;  [facility];  Service: Gastroenterology;  Laterality: N/A;  11:15   ESOPHAGOGASTRODUODENOSCOPY (EGD) WITH PROPOFOL  N/A 08/21/2022   Procedure: ESOPHAGOGASTRODUODENOSCOPY (EGD) WITH PROPOFOL ;  Surgeon: Eartha Angelia Sieving, MD;  [facility];  Service: Gastroenterology;  Laterality: N/A;  1115AM, ASA 2   EYE SURGERY     cataracts removed   Foot surgeries Bilateral    hammer toes   LEFT HEART CATH AND CORONARY ANGIOGRAPHY N/A 04/26/2020   Procedure: LEFT HEART CATH AND CORONARY ANGIOGRAPHY;  Surgeon:  Jordan, Peter M, MD;  Location: [facility];  Service: Cardiovascular;  Laterality: N/A;   LUMBAR LAMINECTOMY/DECOMPRESSION MICRODISCECTOMY Bilateral 11/20/2022   Procedure: Lumbar Five-Sacral One Laminectomy and Foraminotomy;  Surgeon: Louis Shove, MD;  Location: [facility] OR;  Service: Neurosurgery;  Laterality: Bilateral;  3C   MALONEY DILATION N/A 02/12/2014   Procedure: MALONEY DILATION;  Surgeon: Claudis RAYMOND Rivet, MD;  [facility];  Service: Endoscopy;  Laterality: N/A;   POLYPECTOMY  05/01/2019   Procedure: POLYPECTOMY;  Surgeon: Rivet Claudis RAYMOND, MD;  [facility];  Service: Endoscopy;;  colon    RADIAL ARTERY HARVEST Left 04/27/2020   Procedure: LEFT RADIAL ARTERY HARVEST;  Surgeon: German Bartlett PEDLAR, MD;  Location: [facility];  Service: Open Heart Surgery;  Laterality: Left;   Right knee arthroscopy     x2   TEE WITHOUT CARDIOVERSION N/A 04/27/2020   Procedure: TRANSESOPHAGEAL ECHOCARDIOGRAM (TEE);  Surgeon: German Bartlett PEDLAR, MD;  [facility];  Service: Open Heart Surgery;  Laterality: N/A;   TONSILLECTOMY     TOTAL ABDOMINAL HYSTERECTOMY     TOTAL KNEE ARTHROPLASTY Right 04/03/2017   Procedure: RIGHT TOTAL KNEE ARTHROPLASTY;  Surgeon: Heide Ingle, MD;  Location: [facility];  Service: Orthopedics;  Laterality: Right;   Social History   Socioeconomic History   Marital status: Divorced    Spouse name: Not on file   Number of children: Not on file   Years of education: Not on file   Highest education level: Not on file  Occupational History   Not on file  Tobacco Use   Smoking status: Never   Smokeless tobacco: Never  Vaping Use   Vaping status: Never Used  Substance and Sexual Activity   Alcohol  use: No    Alcohol /week: 0.0 standard drinks of alcohol     Comment: socially    Drug use: No   Sexual activity: Not Currently    Partners: Male    Birth control/protection: None  Other Topics Concern   Not on file  Social History Narrative   Not on file    Social Drivers of Health   Financial Resource Strain: Not on file  Food Insecurity: No Food Insecurity (04/16/2024)   Hunger Vital Sign    Worried About Running Out of Food in the Last Year: Never true    Ran Out of Food in the Last Year: Never true  Transportation Needs: No Transportation Needs (04/16/2024)   PRAPARE - Administrator, Civil Service (Medical): No    Lack of Transportation (Non-Medical): No  Physical Activity: Not on file  Stress: Not on file  Social Connections: Moderately Integrated (04/16/2024)   Social  Connection and Isolation Panel    Frequency of Communication with Friends and Family: More than three times a week    Frequency of Social Gatherings with Friends and Family: More than three times a week    Attends Religious Services: More than 4 times per year    Active Member of [employer] or Organizations: Yes    Attends Banker Meetings: More than 4 times per year    Marital Status: Widowed  Intimate Partner Violence: Not At Risk (04/16/2024)   Humiliation, Afraid, Rape, and Kick questionnaire    Fear of Current or Ex-Partner: No    Emotionally Abused: No    Physically Abused: No    Sexually Abused: No   Current Outpatient Medications on File Prior to Visit  Medication Sig Dispense Refill   acyclovir  (ZOVIRAX ) 400 MG tablet Take 400 mg by mouth daily.     ALPRAZolam  (XANAX ) 0.5 MG tablet Take 0.5 mg by mouth at bedtime as needed for anxiety or sleep.     aspirin  81 MG EC tablet Take 1 tablet (81 mg total) by mouth daily. Swallow whole. 30 tablet 11   Continuous Glucose Receiver (DEXCOM G7 RECEIVER) DEVI Use to monitor glucose continuously 1 each 0   Continuous Glucose Sensor (DEXCOM G7 SENSOR) MISC Use to check glucose continuously, change sensor every 10 days 9 each 3   cyanocobalamin  (VITAMIN B12) 1000 MCG/ML injection Inject 1,000 mcg into the muscle every 28 (twenty-eight) days.     empagliflozin  (JARDIANCE ) 25 MG TABS tablet take 1  tablet by mouth daily before breakfast. 90 tablet 2   ezetimibe  (ZETIA ) 10 MG tablet take 1 tablet once daily. 90 tablet 2   isosorbide  mononitrate (IMDUR ) 30 MG 24 hr tablet take 1 tablet (30 milligram total) by mouth daily. (Patient taking differently: Take 30 mg by mouth daily.) 90 tablet 0   levocetirizine (XYZAL ) 5 MG tablet Take 5 mg by mouth every evening.     linaclotide  (LINZESS ) 72 MCG capsule Take 1 capsule (72 mcg total) by mouth daily before breakfast. 90 capsule 3   meclizine (ANTIVERT) 25 MG tablet Take 25 mg by mouth 2 (two) times daily as needed for dizziness.     metFORMIN  (GLUCOPHAGE ) 1000 MG tablet take 1 tablet (1,000 MILLIGRAM total) by mouth daily with breakfast. 90 tablet 3   metoprolol  tartrate (LOPRESSOR ) 25 MG tablet take (1/2) tablet by mouth 2 times a day. (Patient taking differently: Take 12.5 mg by mouth 2 (two) times daily.) 90 tablet 2   nitroGLYCERIN  (NITROSTAT ) 0.4 MG SL tablet Place 1 tablet (0.4 mg total) under the tongue every 5 (five) minutes as needed for chest pain. 25 tablet 3   omeprazole  (PRILOSEC) 40 MG capsule take 1 capsule (40 MILLIGRAM total) by mouth daily. (Patient taking differently: Take 40 mg by mouth daily.) 90 capsule 3   oxybutynin  (DITROPAN -XL) 10 MG 24 hr tablet Take 10 mg by mouth every evening.     repaglinide  (PRANDIN ) 0.5 MG tablet Take 2 tablets (1 mg total) by mouth daily before breakfast. Take 2 tablet (0.5 mg total) by mouth daily before breakfast. 180 tablet 1   rosuvastatin  (CRESTOR ) 40 MG tablet take 1 tablet by mouth once daily. 90 tablet 2   No current facility-administered medications on file prior to visit.   Allergies  Allergen Reactions   Bee Venom Anaphylaxis   Levofloxacin Anaphylaxis and Rash   Cardizem  [Diltiazem ] Other (See Comments)    Patient states that she could not  think, felt that she was in the twilight zone.  Arm discomfort.   Diazepam Nausea Only   Lipitor [Atorvastatin] Other (See Comments)    Caused  her body to be out of whack  - elevated sugar also.   Sulfa Antibiotics Itching   Family History  Problem Relation Age of Onset   Diabetes Mother    Stroke Father    Diabetes Sister    Colon cancer Neg Hx    Breast cancer Neg Hx    PE: BP 122/70   Pulse 67   Ht 5' 2 (1.575 m)   Wt 126 lb 9.6 oz (57.4 kg)   SpO2 97%   BMI 23.16 kg/m  Wt Readings from Last 3 Encounters:  05/06/24 126 lb 9.6 oz (57.4 kg)  04/16/24 129 lb 10.1 oz (58.8 kg)  04/16/24 127 lb (57.6 kg)   Constitutional: normal weight, in NAD Eyes: EOMI, no exophthalmos ENT: no thyromegaly, no cervical lymphadenopathy Cardiovascular: RRR, No MRG Respiratory: CTA B Musculoskeletal: no deformities Skin: no rashes Neurological: + tremor with outstretched hands  ASSESSMENT: 1. DM2, insulin -dependent, uncontrolled, with complications - CAD, s/p CABG 2021 - cardiologist: Dr. Okey - PN  2. HL  PLAN:  1. Patient with longstanding, previously uncontrolled type 2 diabetes, with still suboptimal control.  At last visit, HbA1c was higher, at 7.4%.  At that time, sugars were constantly reported low with the previous sensor but no significant lows after she changed her sensor in the few days prior to the appointment.  We discussed that this was an indication that the previous sensor was malfunctioning.  He had hyperglycemic spikes after certain meals, especially after breakfast, and upon questioning she was eating cereals with milk and was taking glipizide  after the meal.  We discussed about moving this before the meal but I also recommended to switch from glipizide  to repaglinide  for moderate effect on blood sugars and hopefully to avoid further lows.  She did have some drops in blood sugars overnight as she misunderstood instructions and she was taking DepoCyt twice a day.  I advised her to only use the repaglinide  before breakfast.  We continued metformin  and Jardiance  at that time. - She returns sooner than the scheduled  appointment after being admitted with DKA 04/16/2024 after having had bronchitis for which she received a steroid injection and she developed nausea and vomiting.  She felt very poorly and was disoriented when she was admitted to the hospital. CGM interpretation: -At today's visit, we reviewed her CGM downloads: It appears that 83% of values are in target range (goal >70%), while 17% are higher than 180 (goal <25%), and 0% are lower than 70 (goal <4%).  The calculated average blood sugar is 143.  The projected HbA1c for the next 3 months (GMI) is 6.7%. -Reviewing the CGM trends, sugars appear to be decreasing overnight, but then increasing more significantly after breakfast and less so after dinner.  Upon questioning, she believes that she is taking the Prandin  before dinner, which can be the reason why the sugars are dropping more overnight.  We discussed about the fact that she needs to take them before breakfast.  She gets the pills prepackaged and I advised her to check carefully and make sure that she is only taking Prandin  before breakfast. -She was recently admitted in DKA, as mentioned above, and we discussed that this could be possibly contributed to by the Jardiance .  For now, we have to stop the SGLT2 inhibitor.  I am  not sure if we can start this back in the future.  At today's visit I plan to check her insulin  production and antipancreatic antibodies. - I suggested to:  Patient Instructions  Please continue: - Metformin  1000 mg in am  STOP JARDIANCE .  Move: - Repaglinide  (Prandin ) 1 mg before breakfast   Please return in 2-3 months.   - HbA1c today: 6.8% (lower) - advised to check sugars at different times of the day - 4x a day, rotating check times - advised for yearly eye exams >> she is UTD - she has burning pain in her feet.  She is planning to return to see podiatry.  I previously recommended the Magnilife neuropathy cream. - return to clinic in 2-3 mo  2. HL -Latest lipid  panel was reviewed from 09/2023: Triglycerides elevated, LDL slightly above goal -Continues on Crestor  40 mg daily and Zetia  10 mg daily without side effects  Component     Latest Ref Rng 05/06/2024  Glucose     65 - 99 mg/dL 870 (H)   Hemoglobin J8R     4.0 - 5.6 % 6.8 !   C-Peptide     0.80 - 3.85 ng/mL 4.84 (H)   ZNT8 Antibodies     <15 U/mL <10   Glutamic Acid Decarb Ab     <5 IU/mL <5   IA-2 Antibody     <5.4 U/mL <5.4   No insulin  deficiency or antipancreatic autoimmunity.  We still need to keep her off Jardiance , but insulin  is not needed for now.  Lela Fendt, MD PhD [facility] Endocrinology

## 2024-05-07 ENCOUNTER — Ambulatory Visit (INDEPENDENT_AMBULATORY_CARE_PROVIDER_SITE_OTHER): Admitting: Gastroenterology

## 2024-05-07 ENCOUNTER — Encounter (INDEPENDENT_AMBULATORY_CARE_PROVIDER_SITE_OTHER): Payer: Self-pay | Admitting: Gastroenterology

## 2024-05-07 VITALS — BP 109/69 | HR 71 | Temp 97.3°F | Ht 62.0 in | Wt 127.7 lb

## 2024-05-07 DIAGNOSIS — K581 Irritable bowel syndrome with constipation: Secondary | ICD-10-CM

## 2024-05-07 DIAGNOSIS — K589 Irritable bowel syndrome without diarrhea: Secondary | ICD-10-CM | POA: Insufficient documentation

## 2024-05-07 MED ORDER — DICYCLOMINE HCL 10 MG PO CAPS: 10.0000 mg | ORAL_CAPSULE | Freq: Two times a day (BID) | ORAL | 1 refills | Status: DC | PRN

## 2024-05-07 NOTE — Progress Notes (Signed)
 Toribio Fortune, M.D. Gastroenterology & Hepatology [facility] Gastroenterology [address] [place], [place] [address]  Primary Care Physician: Rosamond Leta NOVAK, MD [address] [place] [address]  I will communicate my assessment and recommendations to the referring MD via EMR.  History of Present Illness: Katrina Castro is a 78 y.o. female with past medical history of coronary disease status post CABG, diabetes, depression, GERD, chronic dysphagia, hypertension, GERD, IBS-C, who presents for follow up of IBS-C and abdominal pain.  The patient was last seen on 04/16/2024. At that time, the patient was sent to the ER due to the presence of severe dehydration. patient was hospitalized at [facility] on 04/16/2024 she was found to have DKA  in the settings of Jardiance  use.was started on insulin  drip and IV fluids.  Diet was advanced and plan to although was given twice a day.  MRCP was performed which showed chronic mild diffuse intrahepatic and extrahepatic biliary ductal dilation unchanged from 2023 due to postcholecystectomy effect.  Gastroenterology was consulted and was advised to restart Linzess  and to take Miralax  BID.  Patient reports that she has restarted using Linzess  daily 72 mcg. This has helped but still not having her regular bowel movements. Unfortuantely, she ran out of her Miralax  but is planning to buy this again. She states that she has felt significant abdominal pain in her upper abdomen despite taking the medication compliantly. States she has felt some upper abdominal distention and mild soreness in her upper abdomen. The patient denies having any recent nausea, vomiting, fever, chills, hematochezia, melena, hematemesis, abdominal distention,  diarrhea, jaundice, pruritus or weight loss.  Last Colonoscopy:10/08/23 - Preparation of the colon was inadequate.                           - Hemorrhoids found on perianal exam.                            - Stool in the sigmoid colon, in the transverse                            colon and in the cecum.                           - Diverticulosis in the sigmoid colon.                           - The distal rectum and anal verge are normal on                            retroflexion view.                           - No specimens collected.   Last Endoscopy:10/08/23 - 3 cm hiatal hernia.                           - A few bleeding angiodysplastic lesions in the                            stomach. Treated with argon plasma coagulation                            (  [facility]). Nomal stomach biopsied.                           - Normal examined duodenum.  Past Medical History: Past Medical History:  Diagnosis Date   Arthritis    CAD (coronary artery disease)    a. s/p CABG on 04/27/2020 with LIMA-LAD, seq SVG-PDA-PL, seq left radial-RI-OM   Concussion    2015   Depression    Diabetes (HCC)    type 2   Dysphagia    Dysrhythmia    afib   GERD (gastroesophageal reflux disease)    Headache    History of bronchitis    Hypertension    Nausea and vomiting 04/17/2024   Seasonal allergies    Toenail fungus     Past Surgical History: Past Surgical History:  Procedure Laterality Date   APPENDECTOMY     BACK SURGERY     x2   BOTOX  INJECTION N/A 06/06/2016   Procedure: BOTOX  INJECTION;  Surgeon: Claudis RAYMOND Rivet, MD;  [facility];  Service: Endoscopy;  Laterality: N/A;   BOTOX  INJECTION N/A 01/17/2021   Procedure: BOTOX  INJECTION;  Surgeon: Eartha Angelia Sieving, MD;  [facility];  Service: Gastroenterology;  Laterality: N/A;   BOTOX  INJECTION N/A 07/11/2021   Procedure: BOTOX  INJECTION;  Surgeon: Eartha Angelia Sieving, MD;  [facility];  Service: Gastroenterology;  Laterality: N/A;   BOTOX  INJECTION N/A 08/21/2022   Procedure: BOTOX  INJECTION;  Surgeon: Eartha Angelia Sieving, MD;  [facility];  Service: Gastroenterology;  Laterality: N/A;    CHOLECYSTECTOMY     COLONOSCOPY N/A 11/05/2012   Procedure: COLONOSCOPY;  Surgeon: Claudis RAYMOND Rivet, MD;  [facility];  Service: Endoscopy;  Laterality: N/A;  1030   COLONOSCOPY N/A 10/08/2023   Procedure: COLONOSCOPY;  Surgeon: Eartha Angelia Sieving, MD;  [facility];  Service: Gastroenterology;  Laterality: N/A;   COLONOSCOPY WITH PROPOFOL  N/A 05/01/2019   Procedure: COLONOSCOPY WITH PROPOFOL ;  Surgeon: Rivet Claudis RAYMOND, MD;  [facility];  Service: Endoscopy;  Laterality: N/A;  11:20am   CORONARY ARTERY BYPASS GRAFT N/A 04/27/2020   Procedure: CORONARY ARTERY BYPASS GRAFTING (CABG) TIMES FIVE USING LEFT INTERNAL MAMMARY ARTERY, LEFT HARVESTED RADIAL ARTERY, RIGHT GREATER SAPHENOUS VEIN HARVESTED ENDOSCOPICALLY.;  Surgeon: German Bartlett PEDLAR, MD;  Location: [facility];  Service: Open Heart Surgery;  Laterality: N/A;   ESOPHAGEAL DILATION N/A 07/06/2015   Procedure: ESOPHAGEAL DILATION;  Surgeon: Claudis RAYMOND Rivet, MD;  [facility];  Service: Endoscopy;  Laterality: N/A;   ESOPHAGEAL DILATION N/A 06/06/2016   Procedure: ESOPHAGEAL DILATION;  Surgeon: Claudis RAYMOND Rivet, MD;  [facility];  Service: Endoscopy;  Laterality: N/A;   ESOPHAGEAL DILATION N/A 08/21/2022   Procedure: ESOPHAGEAL DILATION;  Surgeon: Eartha Angelia Sieving, MD;  [facility];  Service: Gastroenterology;  Laterality: N/A;   ESOPHAGOGASTRODUODENOSCOPY N/A 02/12/2014   Procedure: ESOPHAGOGASTRODUODENOSCOPY (EGD);  Surgeon: Claudis RAYMOND Rivet, MD;  [facility];  Service: Endoscopy;  Laterality: N/A;  150   ESOPHAGOGASTRODUODENOSCOPY N/A 07/06/2015   Procedure: ESOPHAGOGASTRODUODENOSCOPY (EGD);  Surgeon: Claudis RAYMOND Rivet, MD;  [facility];  Service: Endoscopy;  Laterality: N/A;  1:25 - moved to 1/18 @ 10:30 - Ann to notify pt   ESOPHAGOGASTRODUODENOSCOPY N/A 10/08/2023   Procedure: EGD (ESOPHAGOGASTRODUODENOSCOPY);  Surgeon: Eartha Angelia,  Sieving, MD;  [facility];  Service: Gastroenterology;  Laterality: N/A;  10:30AM;ASA 3   ESOPHAGOGASTRODUODENOSCOPY (EGD) WITH ESOPHAGEAL DILATION N/A 07/25/2012   Procedure: ESOPHAGOGASTRODUODENOSCOPY (EGD) WITH ESOPHAGEAL DILATION;  Surgeon: Claudis RAYMOND Rivet, MD;  [facility];  Service: Endoscopy;  Laterality: N/A;  325-rescheduled to 855 Ann notified pt   ESOPHAGOGASTRODUODENOSCOPY (EGD) WITH PROPOFOL  N/A 06/06/2016   Procedure: ESOPHAGOGASTRODUODENOSCOPY (EGD) WITH PROPOFOL ;  Surgeon: Claudis RAYMOND Rivet, MD;  [facility];  Service: Endoscopy;  Laterality: N/A;   ESOPHAGOGASTRODUODENOSCOPY (EGD) WITH PROPOFOL  N/A 01/17/2021   Procedure: ESOPHAGOGASTRODUODENOSCOPY (EGD) WITH PROPOFOL ;  Surgeon: Eartha Angelia Sieving, MD;  [facility];  Service: Gastroenterology;  Laterality: N/A;  10:35   ESOPHAGOGASTRODUODENOSCOPY (EGD) WITH PROPOFOL  N/A 07/11/2021   Procedure: ESOPHAGOGASTRODUODENOSCOPY (EGD) WITH PROPOFOL ;  Surgeon: Eartha Angelia Sieving, MD;  [facility];  Service: Gastroenterology;  Laterality: N/A;  11:15   ESOPHAGOGASTRODUODENOSCOPY (EGD) WITH PROPOFOL  N/A 08/21/2022   Procedure: ESOPHAGOGASTRODUODENOSCOPY (EGD) WITH PROPOFOL ;  Surgeon: Eartha Angelia Sieving, MD;  [facility];  Service: Gastroenterology;  Laterality: N/A;  1115AM, ASA 2   EYE SURGERY     cataracts removed   Foot surgeries Bilateral    hammer toes   LEFT HEART CATH AND CORONARY ANGIOGRAPHY N/A 04/26/2020   Procedure: LEFT HEART CATH AND CORONARY ANGIOGRAPHY;  Surgeon: Jordan, Peter M, MD;  Location: [facility] INVASIVE CV LAB;  Service: Cardiovascular;  Laterality: N/A;   LUMBAR LAMINECTOMY/DECOMPRESSION MICRODISCECTOMY Bilateral 11/20/2022   Procedure: Lumbar Five-Sacral One Laminectomy and Foraminotomy;  Surgeon: Louis Shove, MD;  [facility] OR;  Service: Neurosurgery;  Laterality: Bilateral;  3C   MALONEY DILATION N/A 02/12/2014   Procedure: MALONEY  DILATION;  Surgeon: Claudis RAYMOND Rivet, MD;  [facility];  Service: Endoscopy;  Laterality: N/A;   POLYPECTOMY  05/01/2019   Procedure: POLYPECTOMY;  Surgeon: Rivet Claudis RAYMOND, MD;  [facility];  Service: Endoscopy;;  colon    RADIAL ARTERY HARVEST Left 04/27/2020   Procedure: LEFT RADIAL ARTERY HARVEST;  Surgeon: German Bartlett PEDLAR, MD;  Location: [facility];  Service: Open Heart Surgery;  Laterality: Left;   Right knee arthroscopy     x2   TEE WITHOUT CARDIOVERSION N/A 04/27/2020   Procedure: TRANSESOPHAGEAL ECHOCARDIOGRAM (TEE);  Surgeon: German Bartlett PEDLAR, MD;  Location: [facility] OR;  Service: Open Heart Surgery;  Laterality: N/A;   TONSILLECTOMY     TOTAL ABDOMINAL HYSTERECTOMY     TOTAL KNEE ARTHROPLASTY Right 04/03/2017   Procedure: RIGHT TOTAL KNEE ARTHROPLASTY;  Surgeon: Heide Ingle, MD;  Location: [facility];  Service: Orthopedics;  Laterality: Right;    Family History: Family History  Problem Relation Age of Onset   Diabetes Mother    Stroke Father    Diabetes Sister    Colon cancer Neg Hx    Breast cancer Neg Hx     Social History: Social History   Tobacco Use  Smoking Status Never  Smokeless Tobacco Never   Social History   Substance and Sexual Activity  Alcohol  Use No   Alcohol /week: 0.0 standard drinks of alcohol    Comment: socially    Social History   Substance and Sexual Activity  Drug Use No    Allergies: Allergies  Allergen Reactions   Bee Venom Anaphylaxis   Levofloxacin Anaphylaxis and Rash   Cardizem  [Diltiazem ] Other (See Comments)    Patient states that she could not think, felt that she was in the twilight zone.  Arm discomfort.   Diazepam Nausea Only   Lipitor [Atorvastatin] Other (See Comments)  Caused her body to be out of whack  - elevated sugar also.   Sulfa Antibiotics Itching    Medications: Current Outpatient Medications  Medication Sig Dispense Refill   ALPRAZolam  (XANAX ) 0.5 MG tablet Take 0.5 mg by mouth  at bedtime as needed for anxiety or sleep. (Patient taking differently: Take 0.25 mg by mouth at bedtime as needed for anxiety or sleep.)     aspirin  81 MG EC tablet Take 1 tablet (81 mg total) by mouth daily. Swallow whole. 30 tablet 11   Continuous Glucose Receiver (DEXCOM G7 RECEIVER) DEVI Use to monitor glucose continuously 1 each 0   Continuous Glucose Sensor (DEXCOM G7 SENSOR) MISC Use to check glucose continuously, change sensor every 10 days 9 each 3   cyanocobalamin  (VITAMIN B12) 1000 MCG/ML injection Inject 1,000 mcg into the muscle every 28 (twenty-eight) days.     ezetimibe  (ZETIA ) 10 MG tablet take 1 tablet once daily. 90 tablet 2   isosorbide  mononitrate (IMDUR ) 30 MG 24 hr tablet take 1 tablet (30 milligram total) by mouth daily. (Patient taking differently: Take 30 mg by mouth daily.) 90 tablet 0   levocetirizine (XYZAL ) 5 MG tablet Take 5 mg by mouth every evening.     linaclotide  (LINZESS ) 72 MCG capsule Take 1 capsule (72 mcg total) by mouth daily before breakfast. 90 capsule 3   meclizine (ANTIVERT) 25 MG tablet Take 25 mg by mouth 2 (two) times daily as needed for dizziness.     metFORMIN  (GLUCOPHAGE ) 1000 MG tablet take 1 tablet (1,000 MILLIGRAM total) by mouth daily with breakfast. 90 tablet 3   metoprolol  tartrate (LOPRESSOR ) 25 MG tablet take (1/2) tablet by mouth 2 times a day. (Patient taking differently: Take 12.5 mg by mouth 2 (two) times daily.) 90 tablet 2   nitroGLYCERIN  (NITROSTAT ) 0.4 MG SL tablet Place 1 tablet (0.4 mg total) under the tongue every 5 (five) minutes as needed for chest pain. 25 tablet 3   omeprazole  (PRILOSEC) 40 MG capsule take 1 capsule (40 MILLIGRAM total) by mouth daily. (Patient taking differently: Take 40 mg by mouth daily.) 90 capsule 3   oxybutynin  (DITROPAN -XL) 10 MG 24 hr tablet Take 10 mg by mouth every evening.     rosuvastatin  (CRESTOR ) 40 MG tablet take 1 tablet by mouth once daily. 90 tablet 2   acyclovir  (ZOVIRAX ) 400 MG tablet Take  400 mg by mouth daily. (Patient not taking: Reported on 05/07/2024)     repaglinide  (PRANDIN ) 0.5 MG tablet Take 2 tablets (1 mg total) by mouth daily before breakfast. Take 2 tablet (0.5 mg total) by mouth daily before breakfast. 180 tablet 1   No current facility-administered medications for this visit.    Review of Systems: GENERAL: negative for malaise, night sweats HEENT: No changes in hearing or vision, no nose bleeds or other nasal problems. NECK: Negative for lumps, goiter, pain and significant neck swelling RESPIRATORY: Negative for cough, wheezing CARDIOVASCULAR: Negative for chest pain, leg swelling, palpitations, orthopnea GI: SEE HPI MUSCULOSKELETAL: Negative for joint pain or swelling, back pain, and muscle pain. SKIN: Negative for lesions, rash PSYCH: Negative for sleep disturbance, mood disorder and recent psychosocial stressors. HEMATOLOGY Negative for prolonged bleeding, bruising easily, and swollen nodes. ENDOCRINE: Negative for cold or heat intolerance, polyuria, polydipsia and goiter. NEURO: negative for tremor, gait imbalance, syncope and seizures. The remainder of the review of systems is noncontributory.   Physical Exam: BP 109/69 (BP Location: Left Arm, Patient Position: Sitting, Cuff Size: Normal)  Pulse 71   Temp (!) 97.3 F (36.3 C) (Temporal)   Ht 5' 2 (1.575 m)   Wt 127 lb 11.2 oz (57.9 kg)   BMI 23.36 kg/m  GENERAL: The patient is AO x3, in no acute distress. HEENT: Head is normocephalic and atraumatic. EOMI are intact. Mouth is well hydrated and without lesions. NECK: Supple. No masses LUNGS: Clear to auscultation. No presence of rhonchi/wheezing/rales. Adequate chest expansion HEART: RRR, normal s1 and s2. ABDOMEN: Soft, nontender, no guarding, no peritoneal signs, and nondistended. BS +. No masses. EXTREMITIES: Without any cyanosis, clubbing, rash, lesions or edema. NEUROLOGIC: AOx3, no focal motor deficit. SKIN: no jaundice, no  rashes  Imaging/Labs: as above  I personally reviewed and interpreted the available labs, imaging and endoscopic files.  Impression and Plan: Katrina Castro is a 78 y.o. female with past medical history of coronary disease status post CABG, diabetes, depression, GERD, chronic dysphagia, hypertension, GERD, IBS-C, who presents for follow up of IBS-C and abdominal pain.  The patient initially presented significant exacerbation of her abdominal pain after episode of DKA.  Fortunately, after aggressive management of this, her symptoms improved.  She is currently on a regimen of Linzess  and MiraLAX , although she has not been taking the MiraLAX  compliantly.  I encouraged her to restart taking MiraLAX  on top of her Linzess  to keep her bowels moving regularly.  As her symptoms are likely related to her IBS-C, she may take Bentyl  as needed only if still necessary to relieve her abdominal complaints.  -Continue Linzess  72 mcg every day -Restart taking MiraLAX  1-3 capfuls daily to keep bowels moving regular -Start Bentyl  1 tablet q12h as needed for abdominal pain  All questions were answered.      Toribio Fortune, MD Gastroenterology and Hepatology [facility] Gastroenterology

## 2024-05-07 NOTE — Patient Instructions (Addendum)
 Continue Linzess  72 mcg every day Restart taking MiraLAX  1-3 capfuls daily to keep bowels moving regular Start Bentyl  1 tablet q12h as needed for abdominal pain

## 2024-05-08 ENCOUNTER — Ambulatory Visit: Payer: Self-pay | Admitting: Internal Medicine

## 2024-05-13 LAB — GLUCOSE, FASTING: Glucose, Bld: 129 mg/dL — ABNORMAL HIGH (ref 65–99)

## 2024-05-13 LAB — GLUTAMIC ACID DECARBOXYLASE AUTO ABS: Glutamic Acid Decarb Ab: <5 [IU]/mL (ref <5)

## 2024-05-13 LAB — ZNT8 ANTIBODIES: ZNT8 Antibodies: <10 U/mL (ref <15)

## 2024-05-13 LAB — C-PEPTIDE: C-Peptide: 4.84 ng/mL — ABNORMAL HIGH (ref 0.80–3.85)

## 2024-05-13 LAB — IA-2 ANTIBODY: IA-2 Antibody: <5.4 U/mL (ref <5.4)

## 2024-05-18 NOTE — Progress Notes (Signed)
 Katrina Castro                                          MRN: [id number]   05/18/2024   The [facility] Quality Team Specialist reviewed this patient medical record for the purposes of chart review for care gap closure. The following were reviewed: chart review for care gap closure-kidney health evaluation for diabetes:eGFR  and uACR.    [facility] Quality Team

## 2024-05-19 ENCOUNTER — Telehealth: Payer: Self-pay

## 2024-05-19 ENCOUNTER — Other Ambulatory Visit: Payer: Self-pay | Admitting: Internal Medicine

## 2024-05-19 ENCOUNTER — Encounter: Admitting: Physical Medicine and Rehabilitation

## 2024-05-19 MED ORDER — GLIPIZIDE ER 2.5 MG PO TB24: 2.5000 mg | ORAL_TABLET | Freq: Every day | ORAL | 1 refills | Status: DC

## 2024-05-19 NOTE — Telephone Encounter (Signed)
 Patient called earlier this afternoon stating her blood sugar were 255 and at 4 pm it dropped down to 151 after taking Jardiance . She stated last ov she was told to stop jardiance  medication, and is currently taking her Metformin  1000 mg and Repaglinide  1 mg in the AM. She is wanting to know if she needs to continue Jardiance  or something else. Please advise?

## 2024-05-20 NOTE — Telephone Encounter (Signed)
 Left patient a voice message advising to discontinue Jardiance  and to begin Glipizide  medication. Advised patient to check Mychart message regarding as to why we needed to stop Jardiance  and begin Glipizide .

## 2024-05-20 NOTE — Telephone Encounter (Signed)
 Pt called me and has been advised and voices understanding.

## 2024-06-08 ENCOUNTER — Other Ambulatory Visit: Payer: Self-pay | Admitting: Nurse Practitioner

## 2024-06-09 ENCOUNTER — Ambulatory Visit: Admitting: Podiatry

## 2024-06-10 ENCOUNTER — Other Ambulatory Visit: Payer: Self-pay | Admitting: Nurse Practitioner

## 2024-06-15 ENCOUNTER — Telehealth: Payer: Self-pay

## 2024-06-15 MED ORDER — GLIPIZIDE ER 2.5 MG PO TB24: 5.0000 mg | ORAL_TABLET | Freq: Every day | ORAL | 3 refills | Status: DC

## 2024-06-15 NOTE — Telephone Encounter (Signed)
 Pt has been notified and voices understanding.

## 2024-06-15 NOTE — Telephone Encounter (Signed)
 Pt called and left a VM stating that her blood sugar is still running high. Its been 250 and Up.   Per the last message that was sen in the early part of December.  She has been advised to stop the Jardiance  and prescribed Glipizide .   Is there anything else you advise?

## 2024-06-16 ENCOUNTER — Ambulatory Visit: Admitting: Podiatry

## 2024-06-22 ENCOUNTER — Telehealth: Payer: Self-pay

## 2024-06-22 NOTE — Telephone Encounter (Signed)
 Patient called stating that her blood sugar was up in the 200s this morning but its been happening every morning,     After speaking with Dr,. Gherghe she wants to see this patient in the office on Thursday. Appt has been made.

## 2024-06-25 ENCOUNTER — Encounter: Payer: Self-pay | Admitting: Internal Medicine

## 2024-06-25 ENCOUNTER — Ambulatory Visit (INDEPENDENT_AMBULATORY_CARE_PROVIDER_SITE_OTHER): Admitting: Internal Medicine

## 2024-06-25 VITALS — BP 112/60 | HR 68 | Ht 62.0 in | Wt 136.0 lb

## 2024-06-25 DIAGNOSIS — E1165 Type 2 diabetes mellitus with hyperglycemia: Secondary | ICD-10-CM

## 2024-06-25 DIAGNOSIS — E785 Hyperlipidemia, unspecified: Secondary | ICD-10-CM

## 2024-06-25 DIAGNOSIS — E1142 Type 2 diabetes mellitus with diabetic polyneuropathy: Secondary | ICD-10-CM | POA: Diagnosis not present

## 2024-06-25 DIAGNOSIS — Z7984 Long term (current) use of oral hypoglycemic drugs: Secondary | ICD-10-CM

## 2024-06-25 DIAGNOSIS — E1159 Type 2 diabetes mellitus with other circulatory complications: Secondary | ICD-10-CM

## 2024-06-25 MED ORDER — EMPAGLIFLOZIN 10 MG PO TABS: 10.0000 mg | ORAL_TABLET | Freq: Every day | ORAL | 3 refills | Status: DC

## 2024-06-25 MED ORDER — GLIPIZIDE ER 2.5 MG PO TB24: 2.5000 mg | ORAL_TABLET | Freq: Every day | ORAL | Status: AC

## 2024-06-25 NOTE — Patient Instructions (Addendum)
 Please continue: - Metformin  1000 mg in am  You can restart: - Jardiance  10 mg before b'fast STOP Jardiance  if you start back on steroids.  Decrease: - Glipizide  XL 2.5 mg before breakfast   Please return in 1.5 months.

## 2024-06-25 NOTE — Progress Notes (Signed)
 Patient ID: Katrina Castro, female   DOB: February 22, 1946, 79 y.o.   MRN: [id number]  HPI: Katrina Castro is a 79 y.o.-year-old female, returning for follow-up for DM2, dx in 2015, noninsulin-dependent, uncontrolled, with complications (CAD, s/p CABG; PN). Pt. previously saw Dr. Kassie.  Last visit with me 1.5 months ago.  Interim history: She has dysphagia-had an EGD which showed reactive gastropathy. She also has a history of IBS with abdominal pain.  On dicyclomine  and Linzess . Before last visit, she was admitted for DKA 04/16/2024 after starting Prednisone  and ABx for bronchitis. She had nausea and vomiting, also back pain.  We stopped Jardiance  at that time.,  However, she returns sooner than the scheduled appointment due to high blood sugars. She now feels she is retaining fluid, gained weight, craves sugars.  Reviewed HbA1c: Lab Results  Component Value Date   HGBA1C 6.8 (A) 05/06/2024   HGBA1C 7.5 (A) 02/26/2024   HGBA1C 6.3 (A) 10/24/2023   HGBA1C 6.6 (A) 05/09/2023   HGBA1C 6.7 12/27/2022   HGBA1C 7.6 (A) 08/02/2022   HGBA1C 7.8 (H) 03/23/2022   HGBA1C 7.0 (A) 11/17/2021   HGBA1C 7.3 (A) 09/20/2021   HGBA1C 8.0 (A) 07/26/2021  02/15/2023: HbA1c 6.4% 12/27/2022: HbA1c 6.7%  Pt is on a regimen of: - Metformin  ER 500 mg 2x daily >> Metformin  1000 mg in am  - Jardiance  25 mg daily >> off now -  Repaglinide  0.5 >> 1 mg before breakfast >> actually taking it before dinner! >> off now - Glipizide  ER 2.5 >> 5 mg before breakfast Previously on Lantus  6 units daily in am >> inconsistently >> stopped 12/2022 She tried Rybelsus  >> nausea, but retrospectively, this was 2/2 Esophageal pbs. She did not qualify for the patient assistance program for Farxiga  before. She was previously on glipizide  IR.  Pt checks her sugars >4x a day:  Prev.:  Previously:  Previously:  Lowest sugar was 50-60s >> 60s; she has hypoglycemia awareness at 70.  Highest sugar was 300s >> 200s >> 200s >>  300s.  Glucometer:Prodigy  - no CKD, last BUN/creatinine:   Lab Results  Component Value Date   BUN 5 (L) 04/18/2024   BUN 5 (L) 04/17/2024   CREATININE 0.52 04/18/2024   CREATININE 0.50 04/17/2024   No results found for: MICRALBCREAT She is not on ACE inhibitor or ARB.  -+ HL; last set of lipids:  05/03/2023: 133/209/42/57 02/20/2023: 192/96/41/133 09/24/2022: 125/100/63/44 Lab Results  Component Value Date   CHOL 112 03/23/2022   HDL 63 09/24/2022   LDLCALC 36 03/23/2022   TRIG 100 09/24/2022   CHOLHDL 2.3 03/23/2022  On Crestor  40, Zetia  10.  - last eye exam was in 09/2021. No DR - reportedly.   - no numbness and tingling in her feet.  She has a history of left foot surgery.  Last foot exam was here in clinic 02/26/2024.  She also has a history of atrial fibrillation, HTN, GERD, depression. She is on B12 supplements.  She goes to the gym - treadmill. She is working part time - caregiver for cancer patient.  She used to drive a truck for many years. She was abandoned by her mother in a hotel when she was 78 weeks old.  ROS: + see HPI  Past Medical History:  Diagnosis Date   Arthritis    CAD (coronary artery disease)    a. s/p CABG on 04/27/2020 with LIMA-LAD, seq SVG-PDA-PL, seq left radial-RI-OM   Concussion    2015  Depression    Diabetes (HCC)    type 2   Dysphagia    Dysrhythmia    afib   GERD (gastroesophageal reflux disease)    Headache    History of bronchitis    Hypertension    Nausea and vomiting 04/17/2024   Seasonal allergies    Toenail fungus    Past Surgical History:  Procedure Laterality Date   APPENDECTOMY     BACK SURGERY     x2   BOTOX  INJECTION N/A 06/06/2016   Procedure: BOTOX  INJECTION;  Surgeon: Claudis RAYMOND Rivet, MD;  [facility];  Service: Endoscopy;  Laterality: N/A;   BOTOX  INJECTION N/A 01/17/2021   Procedure: BOTOX  INJECTION;  Surgeon: Eartha Angelia Sieving, MD;  [facility]: [facility];  Service:  Gastroenterology;  Laterality: N/A;   BOTOX  INJECTION N/A 07/11/2021   Procedure: BOTOX  INJECTION;  Surgeon: Eartha Angelia Sieving, MD;  [facility]: [facility];  Service: Gastroenterology;  Laterality: N/A;   BOTOX  INJECTION N/A 08/21/2022   Procedure: BOTOX  INJECTION;  Surgeon: Eartha Angelia Sieving, MD;  [facility];  Service: Gastroenterology;  Laterality: N/A;   CHOLECYSTECTOMY     COLONOSCOPY N/A 11/05/2012   Procedure: COLONOSCOPY;  Surgeon: Claudis RAYMOND Rivet, MD;  [facility];  Service: Endoscopy;  Laterality: N/A;  1030   COLONOSCOPY N/A 10/08/2023   Procedure: COLONOSCOPY;  Surgeon: Eartha Angelia Sieving, MD;  [facility];  Service: Gastroenterology;  Laterality: N/A;   COLONOSCOPY WITH PROPOFOL  N/A 05/01/2019   Procedure: COLONOSCOPY WITH PROPOFOL ;  Surgeon: Rivet Claudis RAYMOND, MD;  [facility];  Service: Endoscopy;  Laterality: N/A;  11:20am   CORONARY ARTERY BYPASS GRAFT N/A 04/27/2020   Procedure: CORONARY ARTERY BYPASS GRAFTING (CABG) TIMES FIVE USING LEFT INTERNAL MAMMARY ARTERY, LEFT HARVESTED RADIAL ARTERY, RIGHT GREATER SAPHENOUS VEIN HARVESTED ENDOSCOPICALLY.;  Surgeon: German Bartlett PEDLAR, MD;  Location: [facility];  Service: Open Heart Surgery;  Laterality: N/A;   ESOPHAGEAL DILATION N/A 07/06/2015   Procedure: ESOPHAGEAL DILATION;  Surgeon: Claudis RAYMOND Rivet, MD;  [facility];  Service: Endoscopy;  Laterality: N/A;   ESOPHAGEAL DILATION N/A 06/06/2016   Procedure: ESOPHAGEAL DILATION;  Surgeon: Claudis RAYMOND Rivet, MD;  [facility];  Service: Endoscopy;  Laterality: N/A;   ESOPHAGEAL DILATION N/A 08/21/2022   Procedure: ESOPHAGEAL DILATION;  Surgeon: Eartha Angelia Sieving, MD;  [facility];  Service: Gastroenterology;  Laterality: N/A;   ESOPHAGOGASTRODUODENOSCOPY N/A 02/12/2014   Procedure: ESOPHAGOGASTRODUODENOSCOPY (EGD);  Surgeon: Claudis RAYMOND Rivet, MD;  [facility];  Service:  Endoscopy;  Laterality: N/A;  150   ESOPHAGOGASTRODUODENOSCOPY N/A 07/06/2015   Procedure: ESOPHAGOGASTRODUODENOSCOPY (EGD);  Surgeon: Claudis RAYMOND Rivet, MD;  [facility];  Service: Endoscopy;  Laterality: N/A;  1:25 - moved to 1/18 @ 10:30 - Ann to notify pt   ESOPHAGOGASTRODUODENOSCOPY N/A 10/08/2023   Procedure: EGD (ESOPHAGOGASTRODUODENOSCOPY);  Surgeon: Eartha Angelia, Sieving, MD;  [facility];  Service: Gastroenterology;  Laterality: N/A;  10:30AM;ASA 3   ESOPHAGOGASTRODUODENOSCOPY (EGD) WITH ESOPHAGEAL DILATION N/A 07/25/2012   Procedure: ESOPHAGOGASTRODUODENOSCOPY (EGD) WITH ESOPHAGEAL DILATION;  Surgeon: Claudis RAYMOND Rivet, MD;  [facility];  Service: Endoscopy;  Laterality: N/A;  325-rescheduled to 855 Ann notified pt   ESOPHAGOGASTRODUODENOSCOPY (EGD) WITH PROPOFOL  N/A 06/06/2016   Procedure: ESOPHAGOGASTRODUODENOSCOPY (EGD) WITH PROPOFOL ;  Surgeon: Claudis RAYMOND Rivet, MD;  [facility];  Service: Endoscopy;  Laterality: N/A;   ESOPHAGOGASTRODUODENOSCOPY (EGD) WITH PROPOFOL   N/A 01/17/2021   Procedure: ESOPHAGOGASTRODUODENOSCOPY (EGD) WITH PROPOFOL ;  Surgeon: Eartha Angelia Sieving, MD;  [facility];  Service: Gastroenterology;  Laterality: N/A;  10:35   ESOPHAGOGASTRODUODENOSCOPY (EGD) WITH PROPOFOL  N/A 07/11/2021   Procedure: ESOPHAGOGASTRODUODENOSCOPY (EGD) WITH PROPOFOL ;  Surgeon: Eartha Angelia Sieving, MD;  [facility];  Service: Gastroenterology;  Laterality: N/A;  11:15   ESOPHAGOGASTRODUODENOSCOPY (EGD) WITH PROPOFOL  N/A 08/21/2022   Procedure: ESOPHAGOGASTRODUODENOSCOPY (EGD) WITH PROPOFOL ;  Surgeon: Eartha Angelia Sieving, MD;  [facility];  Service: Gastroenterology;  Laterality: N/A;  1115AM, ASA 2   EYE SURGERY     cataracts removed   Foot surgeries Bilateral    hammer toes   LEFT HEART CATH AND CORONARY ANGIOGRAPHY N/A 04/26/2020   Procedure: LEFT HEART CATH AND CORONARY ANGIOGRAPHY;  Surgeon:  Jordan, Peter M, MD;  Location: [facility] INVASIVE CV LAB;  Service: Cardiovascular;  Laterality: N/A;   LUMBAR LAMINECTOMY/DECOMPRESSION MICRODISCECTOMY Bilateral 11/20/2022   Procedure: Lumbar Five-Sacral One Laminectomy and Foraminotomy;  Surgeon: Louis Shove, MD;  Location: [facility] OR;  Service: Neurosurgery;  Laterality: Bilateral;  3C   MALONEY DILATION N/A 02/12/2014   Procedure: MALONEY DILATION;  Surgeon: Claudis RAYMOND Rivet, MD;  [facility];  Service: Endoscopy;  Laterality: N/A;   POLYPECTOMY  05/01/2019   Procedure: POLYPECTOMY;  Surgeon: Rivet Claudis RAYMOND, MD;  [facility];  Service: Endoscopy;;  colon    RADIAL ARTERY HARVEST Left 04/27/2020   Procedure: LEFT RADIAL ARTERY HARVEST;  Surgeon: German Bartlett PEDLAR, MD;  Location: [facility];  Service: Open Heart Surgery;  Laterality: Left;   Right knee arthroscopy     x2   TEE WITHOUT CARDIOVERSION N/A 04/27/2020   Procedure: TRANSESOPHAGEAL ECHOCARDIOGRAM (TEE);  Surgeon: German Bartlett PEDLAR, MD;  Location[facility];  Service: Open Heart Surgery;  Laterality: N/A;   TONSILLECTOMY     TOTAL ABDOMINAL HYSTERECTOMY     TOTAL KNEE ARTHROPLASTY Right 04/03/2017   Procedure: RIGHT TOTAL KNEE ARTHROPLASTY;  Surgeon: Heide Ingle, MD;  Location: [facility];  Service: Orthopedics;  Laterality: Right;   Social History   Socioeconomic History   Marital status: Divorced    Spouse name: Not on file   Number of children: Not on file   Years of education: Not on file   Highest education level: Not on file  Occupational History   Not on file  Tobacco Use   Smoking status: Never   Smokeless tobacco: Never  Vaping Use   Vaping status: Never Used  Substance and Sexual Activity   Alcohol  use: No    Alcohol /week: 0.0 standard drinks of alcohol     Comment: socially    Drug use: No   Sexual activity: Not Currently    Partners: Male    Birth control/protection: None  Other Topics Concern   Not on file  Social History Narrative   Not on file    Social Drivers of Health   Tobacco Use: Low Risk (05/07/2024)   Patient History    Smoking Tobacco Use: Never    Smokeless Tobacco Use: Never    Passive Exposure: Not on file  Financial Resource Strain: Not on file  Food Insecurity: No Food Insecurity (04/16/2024)   Epic    Worried About Programme Researcher, Broadcasting/film/video in the Last Year: Never true    Ran Out of Food in the Last Year: Never true  Transportation Needs: No Transportation Needs (04/16/2024)   Epic    Lack of Transportation (Medical): No  Lack of Transportation (Non-Medical): No  Physical Activity: Not on file  Stress: Not on file  Social Connections: Moderately Integrated (04/16/2024)   Social Connection and Isolation Panel    Frequency of Communication with Friends and Family: More than three times a week    Frequency of Social Gatherings with Friends and Family: More than three times a week    Attends Religious Services: More than 4 times per year    Active Member of [employer] or Organizations: Yes    Attends Banker Meetings: More than 4 times per year    Marital Status: Widowed  Intimate Partner Violence: Not At Risk (04/16/2024)   Epic    Fear of Current or Ex-Partner: No    Emotionally Abused: No    Physically Abused: No    Sexually Abused: No  Depression (PHQ2-9): Not on file  Alcohol  Screen: Not on file  Housing: Low Risk (04/16/2024)   Epic    Unable to Pay for Housing in the Last Year: No    Number of Times Moved in the Last Year: 0    Homeless in the Last Year: No  Utilities: Not At Risk (04/16/2024)   Epic    Threatened with loss of utilities: No  Health Literacy: Not on file   Current Outpatient Medications on File Prior to Visit  Medication Sig Dispense Refill   acyclovir  (ZOVIRAX ) 400 MG tablet Take 400 mg by mouth daily. (Patient not taking: Reported on 05/07/2024)     ALPRAZolam  (XANAX ) 0.5 MG tablet Take 0.5 mg by mouth at bedtime as needed for anxiety or sleep. (Patient taking  differently: Take 0.25 mg by mouth at bedtime as needed for anxiety or sleep.)     aspirin  81 MG EC tablet Take 1 tablet (81 mg total) by mouth daily. Swallow whole. 30 tablet 11   Continuous Glucose Receiver (DEXCOM G7 RECEIVER) DEVI Use to monitor glucose continuously 1 each 0   Continuous Glucose Sensor (DEXCOM G7 SENSOR) MISC Use to check glucose continuously, change sensor every 10 days 9 each 3   cyanocobalamin  (VITAMIN B12) 1000 MCG/ML injection Inject 1,000 mcg into the muscle every 28 (twenty-eight) days.     dicyclomine  (BENTYL ) 10 MG capsule Take 1 capsule (10 mg total) by mouth every 12 (twelve) hours as needed (abdominal pain). 180 capsule 1   ezetimibe  (ZETIA ) 10 MG tablet take 1 tablet once daily. 90 tablet 3   glipiZIDE  (GLUCOTROL  XL) 2.5 MG 24 hr tablet Take 2 tablets (5 mg total) by mouth daily with breakfast. 180 tablet 3   isosorbide  mononitrate (IMDUR ) 30 MG 24 hr tablet take 1 tablet (30 milligram total) by mouth daily. (Patient taking differently: Take 30 mg by mouth daily.) 90 tablet 0   levocetirizine (XYZAL ) 5 MG tablet Take 5 mg by mouth every evening.     linaclotide  (LINZESS ) 72 MCG capsule Take 1 capsule (72 mcg total) by mouth daily before breakfast. 90 capsule 3   meclizine (ANTIVERT) 25 MG tablet Take 25 mg by mouth 2 (two) times daily as needed for dizziness.     metFORMIN  (GLUCOPHAGE ) 1000 MG tablet take 1 tablet (1,000 MILLIGRAM total) by mouth daily with breakfast. 90 tablet 3   metoprolol  tartrate (LOPRESSOR ) 25 MG tablet take (1/2) tablet by mouth 2 times a day. 90 tablet 2   nitroGLYCERIN  (NITROSTAT ) 0.4 MG SL tablet Place 1 tablet (0.4 mg total) under the tongue every 5 (five) minutes as needed for chest pain. 25 tablet 3  omeprazole  (PRILOSEC) 40 MG capsule take 1 capsule (40 MILLIGRAM total) by mouth daily. (Patient taking differently: Take 40 mg by mouth daily.) 90 capsule 3   oxybutynin  (DITROPAN -XL) 10 MG 24 hr tablet Take 10 mg by mouth every evening.      repaglinide  (PRANDIN ) 0.5 MG tablet Take 2 tablets (1 mg total) by mouth daily before breakfast. Take 2 tablet (0.5 mg total) by mouth daily before breakfast. 180 tablet 1   rosuvastatin  (CRESTOR ) 40 MG tablet take 1 tablet by mouth once daily. 90 tablet 2   No current facility-administered medications on file prior to visit.   Allergies  Allergen Reactions   Bee Venom Anaphylaxis   Levofloxacin Anaphylaxis and Rash   Cardizem  [Diltiazem ] Other (See Comments)    Patient states that she could not think, felt that she was in the twilight zone.  Arm discomfort.   Diazepam Nausea Only   Lipitor [Atorvastatin] Other (See Comments)    Caused her body to be out of whack  - elevated sugar also.   Sulfa Antibiotics Itching   Family History  Problem Relation Age of Onset   Diabetes Mother    Stroke Father    Diabetes Sister    Colon cancer Neg Hx    Breast cancer Neg Hx    PE: BP 112/60   Pulse 68   Ht 5' 2 (1.575 m)   Wt 136 lb (61.7 kg)   SpO2 96%   BMI 24.87 kg/m  Wt Readings from Last 3 Encounters:  06/25/24 136 lb (61.7 kg)  05/07/24 127 lb 11.2 oz (57.9 kg)  05/06/24 126 lb 9.6 oz (57.4 kg)   Constitutional: normal weight, in NAD Eyes: EOMI, no exophthalmos ENT: no thyromegaly, no cervical lymphadenopathy Cardiovascular: RRR, No MRG, + bilateral pitting edema Respiratory: CTA B Musculoskeletal: no deformities Skin: no rashes Neurological: + tremor with outstretched hands  ASSESSMENT: 1. DM2, insulin -dependent, uncontrolled, with complications - CAD, s/p CABG 2021 - cardiologist: Dr. Okey - PN  Component     Latest Ref Rng 05/06/2024  Glucose     65 - 99 mg/dL 870 (H)   C-Peptide     0.80 - 3.85 ng/mL 4.84 (H)   ZNT8 Antibodies     <15 U/mL <10   Glutamic Acid Decarb Ab     <5 IU/mL <5   IA-2 Antibody     <5.4 U/mL <5.4   No insulin  deficiency or antipancreatic autoimmunity.    2. HL  PLAN:  1. Patient with longstanding, previously uncontrolled  type 2 diabetes, with still suboptimal control.  At last visit, HbA1c was lower, at 6.8%, decreased from 7.4%.  At that time, she returned sooner than the scheduled appointment after being admitted with DKA 04/16/2024 in the setting of bronchitis for which she received a steroid injection and she developed nausea and vomiting.  She felt very poorly and was disoriented when she was admitted to the hospital.  However, the symptoms resolved since then.  At last visit, we checked her for insulin  deficiency and antipancreatic autoimmunity and the investigation was negative.  However, we kept her off SGLT2 inhibitors afterwards. -Reviewing her blood sugars at last visit, they were decreasing overnight but increasing more significantly after breakfast and less so after dinner.  She was taking Prandin  before dinner and removed it before breakfast.  However, afterwards, since sugars increased, we switched her to glipizide  extended release before breakfast.  She returns sooner than the next scheduled appointment due to persistently elevated  blood sugars. CGM interpretation: -At today's visit, we reviewed her CGM downloads: It appears that 47% of values are in target range (goal >70%), while 53% are higher than 180 (goal <25%), and 0% are lower than 70 (goal <4%).  The calculated average blood sugar is 190.  The projected HbA1c for the next 3 months (GMI) is 7.9%. -Reviewing the CGM trends, sugars appear to be much worse than before, increasing drastically after breakfast and then again after her second meal of the day despite adding the sulfonylurea in the morning.  She continues metformin  with breakfast.  She would really want to restart Jardiance  as she felt much better on this.  At this point, since her C-peptide was normal and antipancreatic antibodies were not elevated, I agreed to restart her low-dose Farxiga  but I advised her that if she starts back on steroids, she needs to stop Farxiga  right away.  Will also  reduce the dose of her glipizide  due to the fluid retention/weight gain and occasional slight lows after breakfast. - I suggested to:  Patient Instructions  Please continue: - Metformin  1000 mg in am  You can restart: - Jardiance  10 mg before b'fast STOP Jardiance  if you start back on steroids.  Decrease: - Glipizide  XL 2.5 mg before breakfast   Please return in 1.5 months.   - advised to check sugars at different times of the day - 4x a day, rotating check times - she has burning pain in her feet.  She is planning to return to see podiatry.  I previously recommended the Magnilife neuropathy cream.  - return to clinic in 1.5 months  2. HL - Latest lipid panel was reviewed from 05/2024: All fractions at goal - She continues on Crestor  40 mg daily and Zetia  10 mg daily without side effects  Lela Fendt, MD PhD [facility]

## 2024-06-30 ENCOUNTER — Ambulatory Visit (INDEPENDENT_AMBULATORY_CARE_PROVIDER_SITE_OTHER)

## 2024-06-30 ENCOUNTER — Ambulatory Visit: Admitting: Internal Medicine

## 2024-06-30 ENCOUNTER — Telehealth: Payer: Self-pay | Admitting: Internal Medicine

## 2024-06-30 ENCOUNTER — Ambulatory Visit: Admitting: Podiatry

## 2024-06-30 DIAGNOSIS — M2041 Other hammer toe(s) (acquired), right foot: Secondary | ICD-10-CM

## 2024-06-30 DIAGNOSIS — M2042 Other hammer toe(s) (acquired), left foot: Secondary | ICD-10-CM

## 2024-06-30 NOTE — Patient Instructions (Signed)
 More silicone pads can be purchased from:  [email or web address]

## 2024-06-30 NOTE — Telephone Encounter (Signed)
 Patient dropped off patient assistance paperwork.  This was placed in the providers in box at the front desk.  Company is - [employer] for Jardiance 

## 2024-07-01 MED ORDER — EMPAGLIFLOZIN 10 MG PO TABS: 10.0000 mg | ORAL_TABLET | Freq: Every day | ORAL | 3 refills | Status: DC

## 2024-07-01 NOTE — Telephone Encounter (Signed)
 Forms faxed back to [facility].

## 2024-07-01 NOTE — Addendum Note (Signed)
 Addended by: CLEOTILDE ROLIN RAMAN on: 07/01/2024 11:35 AM   Modules accepted: Orders

## 2024-07-02 NOTE — Progress Notes (Signed)
"  °  Subjective:  Patient ID: Katrina Castro, female    DOB: 1945/12/20,  MRN: [id number]  Chief Complaint  Patient presents with   Callouses    Patient present to office today for nail trim and callous shaving patient reports some tenderness on her L foot second toe.    79 y.o. female presents with the above complaint. History confirmed with patient.  She returns for follow-up on multiple pedal issues including her toe deformities and the painful calluses.     Objective:  Physical Exam: warm, good capillary refill, no trophic changes or ulcerative lesions, normal DP and PT pulses, and some neuropathy noted, she has painful tender hyperkeratoses left plantar third toe and right plantar third toe.  Hammertoe deformities, hallux varus on the left.      Radiographs: Multiple views x-ray of the both feet: X-rays taken today show multiple digital deformities, hallux varus deformity on the left with arthritic changes in the digits.  Previous PIPJ arthrodesis of the third toes at the area of concern Assessment:   1. Hammertoe of left foot   2. Hammer toe of right foot      Plan:  Patient was evaluated and treated and all questions answered.  All symptomatic hyperkeratoses were safely debrided with a sterile #15 blade to patient's level of comfort without incident. We discussed preventative and palliative care of these lesions including supportive and accommodative shoegear, padding, prefabricated and custom molded accommodative orthoses, use of a pumice stone and lotions/creams daily.  We again discussed her digital deformities from previous surgery.  There is prominent bone I discussed revision hammertoe surgery may offer some relief.  She is going to consider this and let me know if she would like to proceed.   Return if symptoms worsen or fail to improve.   "

## 2024-07-06 ENCOUNTER — Other Ambulatory Visit (INDEPENDENT_AMBULATORY_CARE_PROVIDER_SITE_OTHER): Payer: Self-pay

## 2024-07-06 ENCOUNTER — Telehealth (INDEPENDENT_AMBULATORY_CARE_PROVIDER_SITE_OTHER): Payer: Self-pay

## 2024-07-06 DIAGNOSIS — K5900 Constipation, unspecified: Secondary | ICD-10-CM

## 2024-07-06 MED ORDER — LINACLOTIDE 72 MCG PO CAPS: 72.0000 ug | ORAL_CAPSULE | Freq: Every day | ORAL | 0 refills | Status: AC

## 2024-07-06 NOTE — Telephone Encounter (Signed)
 Patient calling for samples of Linzess .  Per Dr. Samuel last office note dated 05/07/2024,    -Continue Linzess  72 mcg every day -Restart taking MiraLAX  1-3 capfuls daily to keep bowels moving regular -Start Bentyl  1 tablet q12h as needed for abdominal pain

## 2024-07-06 NOTE — Telephone Encounter (Signed)
 I spoke with the patient made her aware I have placed her some samples at the front desk. Patient states understanding and will plan on picking up on 07/07/2024.

## 2024-07-09 ENCOUNTER — Other Ambulatory Visit: Payer: Self-pay

## 2024-07-09 MED ORDER — EMPAGLIFLOZIN 10 MG PO TABS: 10.0000 mg | ORAL_TABLET | Freq: Every day | ORAL | 1 refills | Status: AC

## 2024-07-21 ENCOUNTER — Ambulatory Visit: Admitting: Internal Medicine

## 2024-07-28 ENCOUNTER — Ambulatory Visit: Admitting: Internal Medicine

## 2024-08-14 ENCOUNTER — Encounter: Admitting: Physical Medicine and Rehabilitation

## 2024-09-17 ENCOUNTER — Ambulatory Visit: Admitting: Podiatry
